# Patient Record
Sex: Male | Born: 1955 | Race: Black or African American | Hispanic: No | Marital: Married | Smoking: Former smoker
Health system: Southern US, Community
[De-identification: ages and names within clinical notes are randomized; demographics above are authoritative.]

## PROBLEM LIST (undated history)

## (undated) DIAGNOSIS — D72829 Elevated white blood cell count, unspecified: Secondary | ICD-10-CM

## (undated) DIAGNOSIS — I519 Heart disease, unspecified: Secondary | ICD-10-CM

## (undated) DIAGNOSIS — L4050 Arthropathic psoriasis, unspecified: Secondary | ICD-10-CM

## (undated) DIAGNOSIS — R933 Abnormal findings on diagnostic imaging of other parts of digestive tract: Secondary | ICD-10-CM

## (undated) DIAGNOSIS — I10 Essential (primary) hypertension: Secondary | ICD-10-CM

## (undated) DIAGNOSIS — I483 Typical atrial flutter: Secondary | ICD-10-CM

## (undated) DIAGNOSIS — M069 Rheumatoid arthritis, unspecified: Secondary | ICD-10-CM

## (undated) DIAGNOSIS — I2510 Atherosclerotic heart disease of native coronary artery without angina pectoris: Secondary | ICD-10-CM

## (undated) DIAGNOSIS — L409 Psoriasis, unspecified: Secondary | ICD-10-CM

## (undated) DIAGNOSIS — E785 Hyperlipidemia, unspecified: Secondary | ICD-10-CM

## (undated) DIAGNOSIS — I219 Acute myocardial infarction, unspecified: Secondary | ICD-10-CM

## (undated) DIAGNOSIS — I255 Ischemic cardiomyopathy: Secondary | ICD-10-CM

## (undated) DIAGNOSIS — R591 Generalized enlarged lymph nodes: Secondary | ICD-10-CM

## (undated) DIAGNOSIS — I447 Left bundle-branch block, unspecified: Secondary | ICD-10-CM

## (undated) DIAGNOSIS — J8410 Pulmonary fibrosis, unspecified: Secondary | ICD-10-CM

## (undated) DIAGNOSIS — K219 Gastro-esophageal reflux disease without esophagitis: Secondary | ICD-10-CM

## (undated) HISTORY — DX: Psoriasis, unspecified: L40.9

## (undated) HISTORY — DX: Rheumatoid arthritis, unspecified: M06.9

## (undated) HISTORY — DX: Pulmonary fibrosis, unspecified: J84.10

## (undated) HISTORY — DX: Gastro-esophageal reflux disease without esophagitis: K21.9

## (undated) HISTORY — DX: Elevated white blood cell count, unspecified: D72.829

## (undated) HISTORY — DX: Abnormal findings on diagnostic imaging of other parts of digestive tract: R93.3

## (undated) HISTORY — DX: Arthropathic psoriasis, unspecified: L40.50

## (undated) HISTORY — DX: Hyperlipidemia, unspecified: E78.5

## (undated) HISTORY — DX: Atherosclerotic heart disease of native coronary artery without angina pectoris: I25.10

## (undated) HISTORY — DX: Generalized enlarged lymph nodes: R59.1

---

## 2001-03-04 DIAGNOSIS — L409 Psoriasis, unspecified: Secondary | ICD-10-CM

## 2001-03-04 HISTORY — DX: Psoriasis, unspecified: L40.9

## 2003-06-10 ENCOUNTER — Ambulatory Visit (HOSPITAL_COMMUNITY): Admission: RE | Admit: 2003-06-10 | Discharge: 2003-06-10 | Payer: Self-pay | Admitting: Internal Medicine

## 2003-06-14 ENCOUNTER — Ambulatory Visit (HOSPITAL_COMMUNITY): Admission: RE | Admit: 2003-06-14 | Discharge: 2003-06-14 | Payer: Self-pay | Admitting: Internal Medicine

## 2003-07-01 HISTORY — PX: COLONOSCOPY: SHX174

## 2004-01-05 ENCOUNTER — Ambulatory Visit: Payer: Self-pay | Admitting: Family Medicine

## 2004-02-02 ENCOUNTER — Ambulatory Visit: Payer: Self-pay | Admitting: *Deleted

## 2005-10-15 ENCOUNTER — Ambulatory Visit: Payer: Self-pay | Admitting: Internal Medicine

## 2009-03-07 ENCOUNTER — Encounter: Payer: Self-pay | Admitting: Family Medicine

## 2010-03-04 DIAGNOSIS — M069 Rheumatoid arthritis, unspecified: Secondary | ICD-10-CM

## 2010-03-04 HISTORY — DX: Rheumatoid arthritis, unspecified: M06.9

## 2010-04-03 NOTE — Letter (Signed)
Summary: Historic Patient File  Historic Patient File   Imported By: Lind Guest 03/07/2009 09:13:40  _____________________________________________________________________  External Attachment:    Type:   Image     Comment:   External Document

## 2010-07-20 NOTE — Op Note (Signed)
NAME:  Curtis Flores, Curtis Flores                          ACCOUNT NO.:  [id number]   MEDICAL RECORD NO.:  [id number]                   PATIENT TYPE:  AMB   LOCATION:  DAY                                  FACILITY:  [facility]   PHYSICIAN:  R. Roetta Sessions, M.D.              DATE OF BIRTH:  1955/03/06   DATE OF PROCEDURE:  06/11/2003  DATE OF DISCHARGE:                                 OPERATIVE REPORT   PROCEDURE:  Screening colonoscopy.   ENDOSCOPIST:  Gerrit Friends. Rourk, M.D.   INDICATIONS FOR PROCEDURE:  The patient is a 55 year old gentleman who comes  for colorectal cancer screening. He is devoid of any lower GI tract  symptoms.  No family history of colorectal neoplasia.  He has never had his  colon imaged previously.  Colonoscopy is now being done as a standard  screening maneuver.  This approach has been discussed with the patient at  length.  The potential risks, benefits, and alternatives have been reviewed;  questions answered.   Please see my handwritten H&P for more information.   PROCEDURE NOTE:  O2 saturation, blood pressure, pulse and respirations were  monitored throughout the entirety procedure.  Conscious sedation: Versed 3 mg IV, Demerol 75 mg IV in divided doses.   INSTRUMENT:  Olympus video chip adult colonoscope.   FINDINGS:  A digital rectal exam revealed no abnormalities.   ENDOSCOPIC FINDINGS:  The prep was excellent except in the proximal right  colon and cecum where it was poor and inadequate.   RECTUM:  Examination of the rectal mucosa including the retroflex view of  the anal verge revealed no abnormalities.   COLON:  The colonic mucosa was surveyed from the rectosigmoid junction  through the left transverse and right colon.  The colonic mucosa was well  seen and appeared normal to the __________ colon.  However, in the right  colon there was a grossly inadequate prep with quite a bit of viscous  vegetable matter sitting down in the cecum and spanning the  ileocecal valve  (the patient admitted to eating a chicken sandwich yesterday afternoon  because he was hungry.  I was able to turn the patient and get this  material out of the cecum.  I saw the appendiceal orifice and the cecum  well; however, the area of the ileocecal valve and the 10 cm or so segment  of the right colon distal to the ileocecal valve was not seen. I was not  able to overcome this material with suctioning and washing or my getting it  to move out of the way by turning the patient.  Again, the base of the cecum  was well seen and the colonic mucosa all the way to the proximal right colon  was seen, however, there was a notable portion that could not be seen given  the poor prep today.  The scope was slowly withdrawn.   All  previously mentioned mucosal surfaces were again seen; no other  abnormalities were observed.  The patient tolerated the procedure well and  was reacted in endoscopy.   IMPRESSION:  1. Normal rectum.  2. Normal colonic mucosa except for the proximal right colon in the area of     the ileocecal valve as described above because of a poor prep.   I consider this exam inadequate because I was unable to see the entire  colonic mucosa.   RECOMMENDATIONS:  Will send the patient for an air contrast barium enema to  image his right colon next week.  We will make further recommendations after  the study is available for review.      ___________________________________________                                            Jonathon Bellows, M.D.   RMR/MEDQ  D:  06/10/2003  T:  06/11/2003  Job:  [id number]   cc:   Colon Flattery, D.O.  [address]  [place]  [place] [address]  Fax: [phone]   R. Roetta Sessions, M.D.  [address]  [place]  [place] [address]  Fax: [phone]

## 2011-01-22 ENCOUNTER — Institutional Professional Consult (permissible substitution): Payer: Self-pay | Admitting: Internal Medicine

## 2011-02-21 ENCOUNTER — Institutional Professional Consult (permissible substitution): Payer: Self-pay | Admitting: Internal Medicine

## 2011-03-19 ENCOUNTER — Institutional Professional Consult (permissible substitution): Payer: Self-pay | Admitting: Pulmonary Disease

## 2011-03-21 ENCOUNTER — Ambulatory Visit (INDEPENDENT_AMBULATORY_CARE_PROVIDER_SITE_OTHER): Payer: BC Managed Care – PPO | Admitting: Pulmonary Disease

## 2011-03-21 ENCOUNTER — Encounter: Payer: Self-pay | Admitting: Pulmonary Disease

## 2011-03-21 VITALS — BP 140/60 | HR 63 | Temp 98.1°F | Ht 66.0 in | Wt 181.6 lb

## 2011-03-21 DIAGNOSIS — J8410 Pulmonary fibrosis, unspecified: Secondary | ICD-10-CM

## 2011-03-21 NOTE — Assessment & Plan Note (Addendum)
With lymphadenopathy raises the question of sarcoidosis. 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 Will obtain HRCT to clarify - contrast for lymph nodes - renal function ok. Full pFTs will be obtained - but agree with holding methotrexate & using alternative agent in the meantime. He does desatn on exertion so expect some degree of restriction Would also like  Baseline ACE level - he will get this when he goes to Dr deveshwar's office - If there is a question of sarcoid, we can proceedw ith transbronchial bniopsy - however if HRCT is typical for UIP then this would be more likely due to rheumatoid. I discussed above plan with his wife & son in detail

## 2011-03-21 NOTE — Progress Notes (Signed)
  Subjective:    Patient ID: Curtis Flores, male    DOB: 1955/12/25, 56 y.o.   MRN: [id number]  HPI Rheum - Curtis Flores  55/M, ex smoker referred for evaluation of sarcoidosis. He has psoriasis x 10 yrs, rheumatoid arthritis was diagnosed 10/12  when he presented with a symmetric polyarthritis of elbows, wrists, hands, neck, shoulders & feet CXR at [facility] showed bibasal interstitial fibrosis with hilar lymphadenopathy Labs- WC 15-16k, cr 0.7ESR  34, RA 89, TSH nml, CCP 122, ANA neg, HLA B 27 neg, CK 87 PPD neg, hep panel neg Prednisone taper helped his symptoms, methotrexate was started but not continued due to his fibrosis. He denies dyspnea, cough or wheeze, there are no h/o skin lesions on his shin s/o erythema nodosum He desats to 92 % on 3 laps around the office.  Review of Systems  Constitutional: Negative for fever and unexpected weight change.  HENT: Positive for sore throat and sneezing. Negative for ear pain, nosebleeds, congestion, rhinorrhea, trouble swallowing, dental problem, postnasal drip and sinus pressure.   Eyes: Negative for redness and itching.  Respiratory: Negative for cough, chest tightness, shortness of breath and wheezing.   Cardiovascular: Positive for leg swelling. Negative for palpitations.  Gastrointestinal: Negative for nausea and vomiting.  Genitourinary: Negative for dysuria.  Musculoskeletal: Positive for joint swelling.  Skin: Positive for rash.  Neurological: Negative for headaches.  Hematological: Does not bruise/bleed easily.  Psychiatric/Behavioral: Positive for dysphoric mood. The patient is not nervous/anxious.        Objective:   Physical Exam  Gen. Pleasant, well-nourished, in no distress, normal affect ENT - no lesions, no post nasal drip Neck: No JVD, no thyromegaly, no carotid bruits Lungs: no use of accessory muscles, no dullness to percussion, clear without rales or rhonchi  Cardiovascular: Rhythm regular, heart sounds   normal, no murmurs or gallops, no peripheral edema Abdomen: soft and non-tender, no hepatosplenomegaly, BS normal. Musculoskeletal: swelling of MCP &IP jts, no cyanosis or clubbing Neuro:  alert, non focal Skin - psoriatic patches over face, scalp, pitting in nails       Assessment & Plan:

## 2011-03-21 NOTE — Patient Instructions (Signed)
You have scar tissue in the lungs which may be related to rheumatoid arthritis or another condition called sarcoidosis CT scan of chest Full breathing test Pl ask dr Corliss Skains to perform serum ACE level with blood work

## 2011-04-03 ENCOUNTER — Telehealth: Payer: Self-pay | Admitting: Oncology

## 2011-04-03 NOTE — Telephone Encounter (Signed)
called pt and scheduled NEw Pt appt for 02/06 .  will fax over letter to Dr. Drusilla Kanner with appt d/t

## 2011-04-05 ENCOUNTER — Telehealth: Payer: Self-pay | Admitting: Oncology

## 2011-04-05 ENCOUNTER — Encounter: Payer: Self-pay | Admitting: Oncology

## 2011-04-05 NOTE — Telephone Encounter (Signed)
Referred by Dr. [name] Dx-Leukocytosis

## 2011-04-09 ENCOUNTER — Ambulatory Visit (INDEPENDENT_AMBULATORY_CARE_PROVIDER_SITE_OTHER)
Admission: RE | Admit: 2011-04-09 | Discharge: 2011-04-09 | Disposition: A | Discharge status: Home / Self Care | Payer: BC Managed Care – PPO | Source: Ambulatory Visit | Attending: Pulmonary Disease | Admitting: Pulmonary Disease

## 2011-04-09 ENCOUNTER — Ambulatory Visit (INDEPENDENT_AMBULATORY_CARE_PROVIDER_SITE_OTHER): Payer: BC Managed Care – PPO | Admitting: Pulmonary Disease

## 2011-04-09 DIAGNOSIS — J8410 Pulmonary fibrosis, unspecified: Secondary | ICD-10-CM

## 2011-04-09 MED ORDER — IOHEXOL 300 MG/ML  SOLN
80.0000 mL | Freq: Once | INTRAMUSCULAR | Status: AC | PRN
  Administered 2011-04-09: 80 mL via INTRAVENOUS

## 2011-04-09 NOTE — Progress Notes (Signed)
PFT done today. 

## 2011-04-10 ENCOUNTER — Ambulatory Visit (HOSPITAL_BASED_OUTPATIENT_CLINIC_OR_DEPARTMENT_OTHER): Payer: BC Managed Care – PPO | Admitting: Oncology

## 2011-04-10 ENCOUNTER — Telehealth: Payer: Self-pay | Admitting: Oncology

## 2011-04-10 ENCOUNTER — Other Ambulatory Visit: Payer: Self-pay | Admitting: Oncology

## 2011-04-10 ENCOUNTER — Emergency Department (HOSPITAL_COMMUNITY)
Admission: EM | Admit: 2011-04-10 | Discharge: 2011-04-10 | Disposition: A | Discharge status: Home / Self Care | Payer: BC Managed Care – PPO | Attending: Emergency Medicine | Admitting: Emergency Medicine

## 2011-04-10 ENCOUNTER — Other Ambulatory Visit (HOSPITAL_COMMUNITY)
Admission: RE | Admit: 2011-04-10 | Discharge: 2011-04-10 | Disposition: A | Discharge status: Home / Self Care | Payer: BC Managed Care – PPO | Source: Ambulatory Visit | Attending: Oncology | Admitting: Oncology

## 2011-04-10 ENCOUNTER — Encounter (HOSPITAL_COMMUNITY): Payer: Self-pay | Admitting: *Deleted

## 2011-04-10 ENCOUNTER — Encounter: Payer: BC Managed Care – PPO | Admitting: *Deleted

## 2011-04-10 ENCOUNTER — Ambulatory Visit: Payer: BC Managed Care – PPO

## 2011-04-10 ENCOUNTER — Encounter: Payer: Self-pay | Admitting: Oncology

## 2011-04-10 ENCOUNTER — Other Ambulatory Visit (HOSPITAL_BASED_OUTPATIENT_CLINIC_OR_DEPARTMENT_OTHER): Payer: BC Managed Care – PPO | Admitting: Lab

## 2011-04-10 DIAGNOSIS — D473 Essential (hemorrhagic) thrombocythemia: Secondary | ICD-10-CM

## 2011-04-10 DIAGNOSIS — D75839 Thrombocytosis, unspecified: Secondary | ICD-10-CM

## 2011-04-10 DIAGNOSIS — I2510 Atherosclerotic heart disease of native coronary artery without angina pectoris: Secondary | ICD-10-CM | POA: Insufficient documentation

## 2011-04-10 DIAGNOSIS — I10 Essential (primary) hypertension: Secondary | ICD-10-CM

## 2011-04-10 DIAGNOSIS — D72829 Elevated white blood cell count, unspecified: Secondary | ICD-10-CM

## 2011-04-10 DIAGNOSIS — R599 Enlarged lymph nodes, unspecified: Secondary | ICD-10-CM

## 2011-04-10 DIAGNOSIS — R591 Generalized enlarged lymph nodes: Secondary | ICD-10-CM

## 2011-04-10 DIAGNOSIS — M069 Rheumatoid arthritis, unspecified: Secondary | ICD-10-CM

## 2011-04-10 DIAGNOSIS — J8410 Pulmonary fibrosis, unspecified: Secondary | ICD-10-CM

## 2011-04-10 DIAGNOSIS — K449 Diaphragmatic hernia without obstruction or gangrene: Secondary | ICD-10-CM | POA: Insufficient documentation

## 2011-04-10 DIAGNOSIS — R05 Cough: Secondary | ICD-10-CM | POA: Insufficient documentation

## 2011-04-10 DIAGNOSIS — R059 Cough, unspecified: Secondary | ICD-10-CM | POA: Insufficient documentation

## 2011-04-10 DIAGNOSIS — L409 Psoriasis, unspecified: Secondary | ICD-10-CM | POA: Insufficient documentation

## 2011-04-10 DIAGNOSIS — D509 Iron deficiency anemia, unspecified: Secondary | ICD-10-CM | POA: Insufficient documentation

## 2011-04-10 DIAGNOSIS — R079 Chest pain, unspecified: Secondary | ICD-10-CM | POA: Insufficient documentation

## 2011-04-10 HISTORY — DX: Essential (primary) hypertension: I10

## 2011-04-10 LAB — COMPREHENSIVE METABOLIC PANEL
ALT: 12 U/L (ref 0–53)
AST: 15 U/L (ref 0–37)
CO2: 25 mEq/L (ref 19–32)
Sodium: 137 mEq/L (ref 135–145)
Total Bilirubin: 0.3 mg/dL (ref 0.3–1.2)
Total Protein: 7.5 g/dL (ref 6.0–8.3)

## 2011-04-10 LAB — CBC WITH DIFFERENTIAL/PLATELET
BASO%: 0.5 % (ref 0.0–2.0)
Basophils Absolute: 0.1 10*3/uL (ref 0.0–0.1)
HCT: 38.9 % (ref 38.4–49.9)
HGB: 12.6 g/dL — ABNORMAL LOW (ref 13.0–17.1)
MCH: 25 pg — ABNORMAL LOW (ref 27.2–33.4)
MCV: 77.6 fL — ABNORMAL LOW (ref 79.3–98.0)
MONO%: 8.2 % (ref 0.0–14.0)
NEUT#: 11.3 10*3/uL — ABNORMAL HIGH (ref 1.5–6.5)
NEUT%: 66.3 % (ref 39.0–75.0)
RDW: 14.6 % (ref 11.0–14.6)

## 2011-04-10 LAB — LACTATE DEHYDROGENASE: LDH: 140 U/L (ref 94–250)

## 2011-04-10 LAB — MORPHOLOGY: PLT EST: ADEQUATE

## 2011-04-10 NOTE — ED Notes (Signed)
Per outpt oncology nurse pt's bp was taken several times and was elevated. Pt denies any chest pain, headaches, or blurred vision. Pt states he was anxious about all the procedures going on today. Pt is alert and oriented at this time

## 2011-04-10 NOTE — Telephone Encounter (Signed)
gv pt appt for 02/13

## 2011-04-10 NOTE — Progress Notes (Signed)
[facility] INITIAL HEMATOLOGY CONSULTATION  Referral MD:  Dr. [name], M.D.   Reason for Referral: neutrophil-predominant leukocytosis.     HPI: Curtis Flores is a 56 yo African American man no long history of psoriasis.  He was in usual state of health until late last year when he developed progressive swelling of distal interphalangeal joint in both hands which eventually become very painful.  He has had problem with buttoning his shirt.  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  He eventually developed diffuse joint pain in shoulders, elbows, wrists, and knees.  Pain is not improved with activities.  He was evaluated by Dr. Corliss Skains with positive cyclic citrullinated peptide Ab IgG at 122 U/mL (ref <5).  He was given diagnosis of rheumatoid arthritis.  He was on MTX for a while; however, when he found to have pulmonary interstitial disease, MTX was discontinued.  Dr. Corliss Skains is planning to start either Prednisone or Embrel in place of MTX after hematology evaluation.    He said that his baseline WBC was normal with his PCP for many years.  On 11/09/2010, WBC 13.6; Hgb 13.1; Plt 337.  On 03/29/2011, his WBC 14.8; Hgb 12.8; Plt 429.  Given his progressive leukocytosis, he was kindly referred to the [facility] for evaluation.  Curtis Flores presented to the clinic with his wife today.  Beside the diffuse bone pain as described above, he also has facial and chest skin rash from psoriasis.  He has mild fatigue.  He has chronic back pain; however, very mild compared to diffuse joint pain.  Patient denies headache, visual changes, confusion, drenching night sweats, palpable lymph node swelling, mucositis, odynophagia, dysphagia, nausea vomiting, jaundice, chest pain, palpitation, shortness of breath, dyspnea on exertion, productive cough, gum bleeding, epistaxis, hematemesis, hemoptysis, abdominal pain, abdominal swelling, early satiety, melena,  hematochezia, hematuria, spontaneous bleeding, heat or cold intolerance, bowel bladder incontinence, focal motor weakness, paresthesia, depression, suicidal or homocidal ideation, feeling hopelessness.   Past Medical History  Diagnosis Date  . Psoriasis 2003  . Rheumatoid arthritis 2012  . Psoriatic arthritis   . Leukocytosis   . Lymphadenopathy   . Pulmonary fibrosis   . Hypertension   . GERD (gastroesophageal reflux disease)   :    History reviewed. No pertinent past surgical history.:   CURRENT MEDS: Current Outpatient Prescriptions  Medication Sig Dispense Refill  . lisinopril-hydrochlorothiazide (PRINZIDE,ZESTORETIC) 20-25 MG per tablet Take 1 tablet by mouth daily.      . naproxen sodium (ANAPROX) 220 MG tablet Take 220 mg by mouth 2 (two) times daily with a meal.      . omeprazole (PRILOSEC) 40 MG capsule Take 40 mg by mouth daily.      [name] acetaminophen (TYLENOL) 500 MG tablet Take 1,000 mg by mouth every 6 (six) hours as needed. pain          No Known Allergies:  History reviewed. No pertinent family history.:  History   Social History  . Marital Status: Married    Spouse Name: N/A    Number of Children: 2  . Years of Education: N/A   Occupational History  . unemployed     not working do to arthritis; used to be Production designer, theatre/television/film for a [employer]   Social History Main Topics  . Smoking status: Former Smoker -- 1.0 packs/day for 30 years    Types: Cigarettes    Quit date: 03/21/2003  .  Smokeless tobacco: Never Used  . Alcohol Use: Yes     12 oz a week.   . Drug Use: No  . Sexually Active: Yes    Birth Control/ Protection: None   Other Topics Concern  . Not on file   Social History Narrative  . No narrative on file    REVIEW OF SYSTEM:  The rest of the 14-point review of sytem was negative.   Exam:  General:  well-nourished in no acute distress.  Eyes:  no scleral icterus.  ENT:  There were no oropharyngeal lesions.  Neck was without  thyromegaly.  Lymphatics:  Negative cervical, supraclavicular or axillary adenopathy.  Respiratory: lungs were clear bilaterally without wheezing or crackles.  Cardiovascular:  Regular rate and rhythm, S1/S2, without murmur, rub or gallop.  There was no pedal edema.  GI:  abdomen was soft, flat, nontender, nondistended, without organomegaly.  Muscoloskeletal:  no spinal tenderness of palpation of enlarged distal interphalangeal joint swelling.  Skin exam was without echymosis, petichae.  There was patches of lighter skin discoloration and erythema in the face and chest.  Neuro exam was nonfocal.  Patient was able to get on and off exam table without assistance.  Gait was normal.  Patient was alerted and oriented.  Attention was good.   Language was appropriate.  Mood was normal without depression.  Speech was not pressured.  Thought content was not tangential.    LABS:  Lab Results  Component Value Date   WBC 17.0* 04/10/2011   HGB 12.6* 04/10/2011   HCT 38.9 04/10/2011   PLT 384 04/10/2011   GLUCOSE 94 04/10/2011   ALT 12 04/10/2011   AST 15 04/10/2011   NA 137 04/10/2011   K 4.2 04/10/2011   CL 99 04/10/2011   CREATININE 0.63 04/10/2011   BUN 14 04/10/2011   CO2 25 04/10/2011    IMAGING:  Ct Chest W Contrast:  I personally reviewed the following CT scan images.     04/09/2011  *RADIOLOGY REPORT*  Clinical Data: Productive cough and left-sided chest pain and back pain for 1 week.  Nonsmoker.  Evaluate for pulmonary fibrosis.  CT CHEST WITH CONTRAST  Technique:  Multidetector CT imaging of the chest was performed following the standard protocol during bolus administration of intravenous contrast.  Contrast: 80mL OMNIPAQUE IOHEXOL 300 MG/ML IV SOLN  Comparison: None.  Findings:  Mediastinum: Heart size is borderline enlarged.  There is no significant pericardial fluid, thickening or pericardial calcification.  There is atherosclerosis of the thoracic aorta, the great vessels of the mediastinum and the coronary arteries,  including calcified atherosclerotic plaque in the left main, left anterior descending, left circumflex and right coronary arteries. There is a moderate hiatal hernia. Numerous borderline enlarged and mildly enlarged mediastinal and bilateral hilar lymph nodes are noted, largest cluster in the right hilar region measures up to 1.4 cm in short axis.  Lungs/Pleura:  Scattered throughout to the lungs bilaterally there are extensive regions of ground-glass attenuation and most pronounced in the lower lobes of the lungs bilaterally. These areas demonstrate internal interstitial prominence with a large amount of microcystic honeycombing (no macrocystic honeycombing identified), with some cylindrical bronchiectasis and bronchiolectasis. Notably, there is typically sparing of the immediate subpleural lung in the regions of involvement.  Inspiratory and excretory images demonstrates increased density throughout areas of ground-glass attenuation on expiration.  No areas of significant air trapping are noted.  Upper Abdomen: Visualized portions of the upper abdomen are unremarkable.  Musculoskeletal: There are  no aggressive appearing lytic or blastic lesions noted in the visualized portions of the skeleton.  IMPRESSION: 1.  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 Prominent mediastinal and hilar lymph nodes are presumably reactive, and are common in this setting of interstitial lung disease. 2.  Atherosclerosis, including left main and three-vessel coronary artery disease. Please note that although the presence of coronary artery calcium documents the presence of coronary artery disease, the severity of this disease and any potential stenosis cannot be assessed on this non-gated CT examination.  Assessment for potential risk factor modification, dietary therapy or pharmacologic therapy may be warranted, if clinically  indicated. 3.  Moderate sized hiatal hernia.  Original Report Authenticated By: Florencia Reasons, M.D.    Blood smear review:   I personally reviewed the patient's peripheral blood smear today.  There was isocytosis.  There was no peripheral blast.  There was increasd in neutrophils.  There was no schistocytosis, spherocytosis, target cell, rouleaux formation, tear drop cell.  There was no giant platelets, but there were occasional platelet clumps.     ASSESSMENT AND PLAN:   1.  Psoriasis:  Many years, involving mostly the face and chest.  2.  Rheumatoid arthritis since 2012:  He was on MTX which was discontinued when lung pathology was found.  He is awaiting to start on Prednisone or Embrel once his leukocytosis is addressed.  3.  Interstitial lung disease with hilar/mediastinal adenopathy:  Most likely due to RA.  He has seen Dr. Vassie Loll.  His ACE level was normal making it less likely to be sarcoid.  I defer to Dr. Vassie Loll to decide whether lung biopsy is indicated; however, story is consistent with RA.  If Dr. Vassie Loll decides not to pursue lung biopsy, then I may consider repeating lung CT in about 4-6 months after he has been on RA treatment to document either resolution or at least improvement of these lung abnormalities.    4.  Leukocytosis and mild thrombocytosis:   Differential diagnosis:  Most likely reactive to his RA.  However, it has been quite progressive since 11/2010 and is now WBC of 17K.  I need to rule out lymphoproliferative disease due to presence of adenopathy.  I need to rule out myeloproliferative disease given both leukocytosis and thrombocytosis.  Work up:  Given the urgency to start him on treatment for his debilitating RA, I recommended diagnosis bone marrow biopsy to rule out these differentials before assuming reactive  eukocytosis and thrombocytosis.  I discussed with him and his wife the benefit and risks of bone marrow biopsy.  He expressed informed understanding and  wished to proceed with that today.  I also sent bone marrow aspiration for BCR/ABL to rule out CML.  I also sent for peripheral blood JAK-2 mutation to rule out essential thrombocytosis given his thrombocytosis.    Treatment:  Depending on result of pending bone marrow biopsy.   5.  Hypertension:  Pre bone marrow, his BP was 140/60.  However, after this extensive discussion and bone marrow biopsy which he only received 20 mL of 2% lidocaine, his BP increased to 255/101, albeit asymptomatic.  Given severe hypertension, he was recommended to present to ER for evaluation.  He has been on home Lisinopril and HCTZ.   6.  Follow up:  RV with me on 04/17/11 to go over result of bone marrow biopsy today.  The length of time of the face-to-face  encounter was 45 minutes. More than 50% of time was spent counseling and coordination of care.

## 2011-04-10 NOTE — Progress Notes (Signed)
Bone marrow biopsy and aspiration.  INDICATION:  Progressive leukocytosis and adenopathy.   Procedure: After obtained from consent, Curtis Flores was placed in the prone position. Time out was performed verifying correct patient and procedure. The skin overlying the left posterior crest was prepped with Betadine and draped in the usual sterile fashion. The skin and periosteum were infiltrated with 10 mL of 2% lidocaine x 2 as he was still uncomfortable after the first 10mL. A small puncture wound was made with #11 scalpel blade.  Bone marrow aspirate was obtained on the first pass of the aspiration needle.  One core biopsy was obtained through the same incision.   The aspirate was sent for routine histology, flow cytometry, and cytogenetics.  Core biopsy was sent for routine histology.   Curtis Flores tolerated procedure well with minimal  blood loss and without immediate complication.   A sterile dressing was applied.   Jethro Bolus M.D. 04/10/2011

## 2011-04-10 NOTE — ED Provider Notes (Signed)
History     CSN: [id number]  Arrival date & time 04/10/11  1346   First MD Initiated Contact with Patient 04/10/11 1502      Chief Complaint  Patient presents with  . Hypertension    (Consider location/radiation/quality/duration/timing/severity/associated sxs/prior treatment) Patient is a 56 y.o. male presenting with hypertension. The history is provided by the patient.  Hypertension This is a new (Today after getting a bone marrow biopsy patient had hypertension the cancer Center and he was sent down here for further evaluation) problem. The current episode started less than 1 hour ago. The problem occurs constantly. The problem has been resolved. Pertinent negatives include no chest pain, no abdominal pain, no headaches and no shortness of breath. Associated symptoms comments: Weakness, confusion. The symptoms are aggravated by nothing. The symptoms are relieved by rest. He has tried nothing for the symptoms. The treatment provided no relief.    Past Medical History  Diagnosis Date  . Psoriasis 2003  . Rheumatoid arthritis 2012  . Psoriatic arthritis   . Leukocytosis   . Lymphadenopathy   . Pulmonary fibrosis   . Hypertension     History reviewed. No pertinent past surgical history.  No family history on file.  History  Substance Use Topics  . Smoking status: Former Smoker -- 1.0 packs/day for 30 years    Types: Cigarettes    Quit date: 03/21/2003  . Smokeless tobacco: Never Used  . Alcohol Use: Yes     12 oz a week.       Review of Systems  Respiratory: Negative for shortness of breath.   Cardiovascular: Negative for chest pain.  Gastrointestinal: Negative for abdominal pain.  Neurological: Negative for seizures, syncope, speech difficulty, weakness, numbness and headaches.  All other systems reviewed and are negative.    Allergies  Review of patient's allergies indicates no known allergies.  Home Medications   Current Outpatient Rx  Name Route Sig  Dispense Refill  . ACETAMINOPHEN 500 MG PO TABS Oral Take 1,000 mg by mouth every 6 (six) hours as needed. pain    . LISINOPRIL-HYDROCHLOROTHIAZIDE 20-25 MG PO TABS Oral Take 1 tablet by mouth daily.    [name] NAPROXEN SODIUM 220 MG PO TABS Oral Take 220 mg by mouth 2 (two) times daily with a meal.    . OMEPRAZOLE 40 MG PO CPDR Oral Take 40 mg by mouth daily.      BP 147/63  Pulse 70  Temp(Src) 98.7 F (37.1 C) (Oral)  Resp 18  Ht 5\' 7"  (1.702 m)  Wt 181 lb (82.101 kg)  BMI 28.35 kg/m2  SpO2 99%  Physical Exam  Nursing note and vitals reviewed. Constitutional: He is oriented to person, place, and time. He appears well-developed and well-nourished. No distress.  HENT:  Head: Normocephalic and atraumatic.  Mouth/Throat: Oropharynx is clear and moist.  Eyes: Conjunctivae and EOM are normal. Pupils are equal, round, and reactive to light.  Cardiovascular: Normal rate, regular rhythm and intact distal pulses.   No murmur heard. Pulmonary/Chest: Effort normal and breath sounds normal. No respiratory distress. He has no wheezes. He has no rales.  Abdominal: Soft. He exhibits no distension. There is no tenderness. There is no rebound and no guarding.  Musculoskeletal: Normal range of motion. He exhibits no edema and no tenderness.  Neurological: He is alert and oriented to person, place, and time.  Skin: Skin is warm and dry. No rash noted. No erythema.  Psychiatric: He has a normal mood and affect.  His behavior is normal.    ED Course  Procedures (including critical care time)  Labs Reviewed - No data to display Ct Chest W Contrast  04/09/2011  *RADIOLOGY REPORT*  Clinical Data: Productive cough and left-sided chest pain and back pain for 1 week.  Nonsmoker.  Evaluate for pulmonary fibrosis.  CT CHEST WITH CONTRAST  Technique:  Multidetector CT imaging of the chest was performed following the standard protocol during bolus administration of intravenous contrast.  Contrast: 80mL OMNIPAQUE  IOHEXOL 300 MG/ML IV SOLN  Comparison: None.  Findings:  Mediastinum: Heart size is borderline enlarged.  There is no significant pericardial fluid, thickening or pericardial calcification.  There is atherosclerosis of the thoracic aorta, the great vessels of the mediastinum and the coronary arteries, including calcified atherosclerotic plaque in the left main, left anterior descending, left circumflex and right coronary arteries. There is a moderate hiatal hernia. Numerous borderline enlarged and mildly enlarged mediastinal and bilateral hilar lymph nodes are noted, largest cluster in the right hilar region measures up to 1.4 cm in short axis.  Lungs/Pleura:  Scattered throughout to the lungs bilaterally there are extensive regions of ground-glass attenuation and most pronounced in the lower lobes of the lungs bilaterally. These areas demonstrate internal interstitial prominence with a large amount of microcystic honeycombing (no macrocystic honeycombing identified), with some cylindrical bronchiectasis and bronchiolectasis. Notably, there is typically sparing of the immediate subpleural lung in the regions of involvement.  Inspiratory and excretory images demonstrates increased density throughout areas of ground-glass attenuation on expiration.  No areas of significant air trapping are noted.  Upper Abdomen: Visualized portions of the upper abdomen are unremarkable.  Musculoskeletal: There are no aggressive appearing lytic or blastic lesions noted in the visualized portions of the skeleton.  IMPRESSION: 1.  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 Prominent mediastinal and hilar lymph nodes are presumably reactive, and are common in this setting of interstitial lung disease. 2.  Atherosclerosis, including left main and three-vessel coronary artery disease. Please note that although the presence of coronary  artery calcium documents the presence of coronary artery disease, the severity of this disease and any potential stenosis cannot be assessed on this non-gated CT examination.  Assessment for potential risk factor modification, dietary therapy or pharmacologic therapy may be warranted, if clinically indicated. 3.  Moderate sized hiatal hernia.  Original Report Authenticated By: Florencia Reasons, M.D.     No diagnosis found.    MDM   Patient sent from the cancer center due to having high blood pressure. He was getting a bone marrow biopsy today and states he was very anxious about the procedure. On arrival to the cancer Center today his blood pressure was 138/67 and after the procedure it was 219/109. He was sent down here. After resting here and that his blood pressure is now 146/67. He denies any headache, shortness of breath, chest pain, weakness or other symptoms suggestive of end organ damage. He has taken all of his blood pressure medications today and has a normal exam otherwise. Will discharge home        Gwyneth Sprout, MD 04/10/11 [phone]

## 2011-04-10 NOTE — Progress Notes (Signed)
Bone Marrow Bx performed by Dr. Gaylyn Rong.  Pt tolerated well.  Instructed to lay on back x 30 min and can be d/c'd after 1315pm if VSS and no bleeding.  Instructed pt to leave dressing on until tomorrow.  Instructed to lay on back x 30 min if any bleeding thru dressing and call if bleeding does not stop.  Dr. Gaylyn Rong instructed pt/wife on pt taking tylenol for pain.  Instructed pt to call [facility] for any questions or concerns.  He verbalized understanding.     This encounter was created in error - please disregard.

## 2011-04-10 NOTE — Patient Instructions (Signed)
[facility] Discharge Instructions for Post Bone Marrow Procedure  Today you had a bone marrow biopsy and aspirate of Left Hip.   Please keep the pressure dressing in place for at least 24 hours.  Have someone check your dressing periodically for bleeding.  If any bleeding, lay on back for 30 minutes.  Call Cancer Center if does not stop (before 5pm) or go to ED if after 5pm.    Take pain medication, tylenol, as directed.  IF BLEEDING REOCCURS THAT SHOULD BE REPORTED IMMEDIATELY. Call the Cancer Center at [phone] if during business hours. Or report to the Emergency Room.   I have been informed and understand all the instructions given to me. I know to contact the clinic, my physician, or go to the Emergency Department if any problems should occur. I do not have any questions at this time, but understand that I may call the clinic during office hours at [phone]  should I have any questions or need assistance in obtaining follow up care.    __________________________________________  _____________  __________ Signature of Patient or Authorized Representative            Date                   Time    __________________________________________ Nurse's Signature

## 2011-04-10 NOTE — Progress Notes (Signed)
Pt taken to ED by w/c w/ wife due to Hypertension. Pt agreed and Dr. Gaylyn Rong notified.

## 2011-04-10 NOTE — ED Notes (Signed)
Pt reports hx of RA, at oncology center today for bone marrow biopsy and BP was too high; pt reports that he thinks he just got excited about what was happening and BP has increased; pt denies any c/o chest pain, blurred vision, dizziness, or headache at this time; family at bedside

## 2011-04-15 LAB — JAK-2 V617F

## 2011-04-17 ENCOUNTER — Ambulatory Visit (HOSPITAL_BASED_OUTPATIENT_CLINIC_OR_DEPARTMENT_OTHER): Payer: BC Managed Care – PPO | Admitting: Oncology

## 2011-04-17 ENCOUNTER — Telehealth: Payer: Self-pay | Admitting: Oncology

## 2011-04-17 DIAGNOSIS — D72829 Elevated white blood cell count, unspecified: Secondary | ICD-10-CM

## 2011-04-17 DIAGNOSIS — M069 Rheumatoid arthritis, unspecified: Secondary | ICD-10-CM

## 2011-04-17 DIAGNOSIS — L408 Other psoriasis: Secondary | ICD-10-CM

## 2011-04-17 DIAGNOSIS — J8410 Pulmonary fibrosis, unspecified: Secondary | ICD-10-CM

## 2011-04-17 NOTE — Progress Notes (Signed)
[facility] OFFICE PROGRESS NOTE   DIAGNOSIS:  Reactive leukocytosis.   CURRENT THERAPY:  Watchful observation.  Treating underlying rheumatoid arthritis.   INTERVAL HISTORY: Curtis Flores 56 y.o. male returns to clinic with his wife to go over the result of bone marrow biopsy last week.  He still has progressive swelling and pain in the finger joints.  He reports that he has been having problem with using utensils, writing, buttoning his shirt.  He has some fatigue; however, he is still independent of activities of daily living. His psoriatic skin rash on his face is stable.   Otherwise, he denies severe fatigue, headache, visual changes, confusion, drenching night sweats, palpable lymph node swelling, mucositis, odynophagia, dysphagia, nausea vomiting, jaundice, chest pain, palpitation, shortness of breath, dyspnea on exertion, productive cough, gum bleeding, epistaxis, hematemesis, hemoptysis, abdominal pain, abdominal swelling, early satiety, melena, hematochezia, hematuria, spontaneous bleeding, heat or cold intolerance, bowel bladder incontinence, back pain, focal motor weakness, paresthesia, depression, suicidal or homocidal ideation, feeling hopelessness.   Past Medical History  Diagnosis Date  . Psoriasis 2003  . Rheumatoid arthritis 2012  . Psoriatic arthritis   . Leukocytosis   . Lymphadenopathy   . Pulmonary fibrosis   . Hypertension   . GERD (gastroesophageal reflux disease)     No past surgical history on file.  Current Outpatient Prescriptions  Medication Sig Dispense Refill  . acetaminophen (TYLENOL) 500 MG tablet Take 1,000 mg by mouth every 6 (six) hours as needed. pain      . lisinopril-hydrochlorothiazide (PRINZIDE,ZESTORETIC) 20-25 MG per tablet Take 1 tablet by mouth daily.      . naproxen sodium (ANAPROX) 220 MG tablet Take 220 mg by mouth 2 (two) times daily with a meal.      . omeprazole (PRILOSEC) 40 MG capsule Take 40 mg by mouth daily.          ALLERGIES:   has no known allergies.  REVIEW OF SYSTEMS:  The rest of the 14-point review of system was negative.   Filed Vitals:   04/17/11 1307  BP: 151/71  Pulse: 63  Temp: 97.3 F (36.3 C)   Wt Readings from Last 3 Encounters:  04/17/11 180 lb 4.8 oz (81.784 kg)  04/10/11 181 lb (82.101 kg)  04/10/11 181 lb 11.2 oz (82.419 kg)   ECOG Performance status: 1  PHYSICAL EXAMINATION:   General: well-nourished in no acute distress. Eyes: no scleral icterus. ENT: There were no oropharyngeal lesions. Neck was without thyromegaly. Lymphatics: Negative cervical, supraclavicular or axillary adenopathy. Respiratory: lungs were clear bilaterally without wheezing or crackles. Cardiovascular: Regular rate and rhythm, S1/S2, without murmur, rub or gallop. There was no pedal edema. GI: abdomen was soft, flat, nontender, nondistended, without organomegaly. Muscoloskeletal: no spinal tenderness of palpation of enlarged distal interphalangeal joint swelling. Skin exam was without echymosis, petichae. There was patches of lighter skin discoloration and erythema in the face and chest. Neuro exam was nonfocal. Patient was able to get on and off exam table without assistance. Gait was normal. Patient was alerted and oriented. Attention was good. Language was appropriate. Mood was normal without depression. Speech was not pressured. Thought content was not tangential   LABORATORY/RADIOLOGY DATA:  Lab Results  Component Value Date   WBC 17.0* 04/10/2011   HGB 12.6* 04/10/2011   HCT 38.9 04/10/2011   PLT 384 04/10/2011   GLUCOSE 94 04/10/2011   ALT 12 04/10/2011   AST 15 04/10/2011   NA 137 04/10/2011   K  4.2 04/10/2011   CL 99 04/10/2011   CREATININE 0.63 04/10/2011   BUN 14 04/10/2011   CO2 25 04/10/2011   PATH:  Bone marrow biopsy on 04/10/2011 showed normal cellular; normal trilineage hematopoesis.  There was a small lymphoid aggregate with no monoclonal process per flow cytometry   Ct Chest W Contrast  04/09/2011   *RADIOLOGY REPORT*  Clinical Data: Productive cough and left-sided chest pain and back pain for 1 week.  Nonsmoker.  Evaluate for pulmonary fibrosis.  CT CHEST WITH CONTRAST  Technique:  Multidetector CT imaging of the chest was performed following the standard protocol during bolus administration of intravenous contrast.  Contrast: 80mL OMNIPAQUE IOHEXOL 300 MG/ML IV SOLN  Comparison: None.  Findings:  Mediastinum: Heart size is borderline enlarged.  There is no significant pericardial fluid, thickening or pericardial calcification.  There is atherosclerosis of the thoracic aorta, the great vessels of the mediastinum and the coronary arteries, including calcified atherosclerotic plaque in the left main, left anterior descending, left circumflex and right coronary arteries. There is a moderate hiatal hernia. Numerous borderline enlarged and mildly enlarged mediastinal and bilateral hilar lymph nodes are noted, largest cluster in the right hilar region measures up to 1.4 cm in short axis.  Lungs/Pleura:  Scattered throughout to the lungs bilaterally there are extensive regions of ground-glass attenuation and most pronounced in the lower lobes of the lungs bilaterally. These areas demonstrate internal interstitial prominence with a large amount of microcystic honeycombing (no macrocystic honeycombing identified), with some cylindrical bronchiectasis and bronchiolectasis. Notably, there is typically sparing of the immediate subpleural lung in the regions of involvement.  Inspiratory and excretory images demonstrates increased density throughout areas of ground-glass attenuation on expiration.  No areas of significant air trapping are noted.  Upper Abdomen: Visualized portions of the upper abdomen are unremarkable.  Musculoskeletal: There are no aggressive appearing lytic or blastic lesions noted in the visualized portions of the skeleton.  IMPRESSION: 1.  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 Prominent mediastinal and hilar lymph nodes are presumably reactive, and are common in this setting of interstitial lung disease. 2.  Atherosclerosis, including left main and three-vessel coronary artery disease. Please note that although the presence of coronary artery calcium documents the presence of coronary artery disease, the severity of this disease and any potential stenosis cannot be assessed on this non-gated CT examination.  Assessment for potential risk factor modification, dietary therapy or pharmacologic therapy may be warranted, if clinically indicated. 3.  Moderate sized hiatal hernia.  Original Report Authenticated By: Florencia Reasons, M.D.    ASSESSMENT AND PLAN:   1. Psoriasis: Many years, involving mostly the face and chest.   2. Rheumatoid arthritis since 2012: He was on MTX which was discontinued when lung pathology was found. He is awaiting to start on Prednisone or Embrel once his leukocytosis is addressed.  There is no contraindication from hematologic standpoint for him to start treatment for his RA.   3. Interstitial lung disease with hilar/mediastinal adenopathy: Most likely due to RA. He has seen Dr. Vassie Loll. His ACE level was normal making it less likely to be sarcoid. I discussed with Dr. Vassie Loll last week.  He may consider lung biopsy in the near future.   4. Leukocytosis and mild thrombocytosis: reactive from RA.  Bone marrow biopsy was negative for lymphoproliferative disease or myeloproliferative disease.  Again, these should not prevent him from any RA-directed  therapy.  Hopefully, these will improve with treatment of the underlying disease process.   5. Hypertension: He has been on home Lisinopril and HCTZ.   6. Follow up: lab monthly.  I will see him in about 4 months.

## 2011-04-17 NOTE — Telephone Encounter (Signed)
Gv pt appt for march-june2013 °

## 2011-04-18 ENCOUNTER — Encounter: Payer: Self-pay | Admitting: Oncology

## 2011-04-18 ENCOUNTER — Encounter: Payer: Self-pay | Admitting: *Deleted

## 2011-04-18 ENCOUNTER — Encounter: Payer: Self-pay | Admitting: Pulmonary Disease

## 2011-04-18 NOTE — Progress Notes (Signed)
Faxed office note to Dr. [name] per Dr. Lodema Pilot request (to [phone]).

## 2011-04-18 NOTE — Progress Notes (Signed)
Patient approve [employer] 04/18/11 - 10/16/11.

## 2011-04-24 ENCOUNTER — Encounter: Payer: Self-pay | Admitting: Pulmonary Disease

## 2011-04-24 ENCOUNTER — Ambulatory Visit (INDEPENDENT_AMBULATORY_CARE_PROVIDER_SITE_OTHER): Payer: BC Managed Care – PPO | Admitting: Pulmonary Disease

## 2011-04-24 DIAGNOSIS — M069 Rheumatoid arthritis, unspecified: Secondary | ICD-10-CM

## 2011-04-24 DIAGNOSIS — J8410 Pulmonary fibrosis, unspecified: Secondary | ICD-10-CM

## 2011-04-24 DIAGNOSIS — D72829 Elevated white blood cell count, unspecified: Secondary | ICD-10-CM

## 2011-04-24 NOTE — Progress Notes (Signed)
Received progress notes from Dr. Corliss Skains [facility]; forwarded to Dr. Gaylyn Rong.

## 2011-04-24 NOTE — Patient Instructions (Signed)
You have inflammation and scar tissue in your lungs This may be related to rheumatoid arthritis or other conditions such as sarcoidosis A lung biopsy can sometimes tell the difference We discussed risks & benefits of the procedure - I can do his on march 11, 12 th, 13th or 14 th in the morning - Pl call me back to confirm

## 2011-04-24 NOTE — Progress Notes (Signed)
  Subjective:    Patient ID: Curtis Flores, male    DOB: 1955/12/23, 56 y.o.   MRN: [id number]  HPI Rheum - Curtis Flores   56/M, ex smoker referred for evaluation of sarcoidosis.  He has psoriasis x 10 yrs, rheumatoid arthritis was diagnosed 10/12 when he presented with a symmetric polyarthritis of elbows, wrists, hands, neck, shoulders & feet  CXR at [facility] showed bibasal interstitial fibrosis with hilar lymphadenopathy  Labs- WC 15-16k, cr 0.7ESR 34, RA 89, TSH nml, CCP 122, ANA neg, HLA B 27 neg, CK 87  PPD neg, hep panel neg  Prednisone taper helped his symptoms, methotrexate was started but not continued due to his fibrosis.  He desats to 92 % on 3 laps around the office.   04/24/2011 Seen by Dr Gaylyn Rong for persistent leucocytosis, bone marrow biopsy neg Prednisone helped  Joints better He denies dyspnea, cough or wheeze, there are no h/o skin lesions on his shin s/o erythema nodosum . ACE level was not obtained  PFTs show intraprenchymal restricton with FVC around 71% & DLCO 56% c/w ILD, no airway obstruction. HRCT showed extensive regions of ground-glass attenuation and most pronounced in the lower lobes of the lungs bilaterally. These areas demonstrate internal interstitial prominence with a large amount of  microcystic honeycombing (no macrocystic honeycombing identified), with some cylindrical bronchiectasis and bronchiolectasis. There was sparing of the immediate subpleural  lung in the regions of involvement, reactive medistinal Lns.Coronary artery calcification & moderate hiatal hernia was also noted.   Review of Systems Patient denies significant dyspnea,cough, hemoptysis,  chest pain, palpitations, pedal edema, orthopnea, paroxysmal nocturnal dyspnea, lightheadedness, nausea, vomiting, abdominal or  leg pains      Objective:   Physical Exam  Gen. Pleasant, well-nourished, in no distress, normal affect ENT - no lesions, no post nasal drip Neck: No JVD, no thyromegaly,  no carotid bruits Lungs: no use of accessory muscles, no dullness to percussion, clear without rales or rhonchi  Cardiovascular: Rhythm regular, heart sounds  normal, no murmurs or gallops, no peripheral edema Abdomen: soft and non-tender, no hepatosplenomegaly, BS normal. Musculoskeletal: swollen IP & MP joints, no cyanosis or clubbing Neuro:  alert, non focal        Assessment & Plan:

## 2011-04-24 NOTE — Assessment & Plan Note (Signed)
Prednisone seems to be helping & Rodena Medin is planned Would like to obtain ACE level if possible with his next labs

## 2011-04-24 NOTE — Assessment & Plan Note (Signed)
Wonder if we have to keep alternative etiologies such as Sweets syndrome  - with persistent leucocytosis

## 2011-04-24 NOTE — Assessment & Plan Note (Signed)
Discussed transbronchial biopsy vs surgical biopsy to make the diagnosis of NSIP vs sarcoid. The risks of the procedure including coughing, bleeding and the small chance of lung puncture requiring chest tube were discussed in great detail. The benefits & alternatives including serial follow up were also discussed. He was agreeable but wife seemed hesitant to go through with the procedure. I gave them tentative dates & they will call back to schedule.

## 2011-05-01 ENCOUNTER — Encounter: Payer: Self-pay | Admitting: Oncology

## 2011-05-06 ENCOUNTER — Other Ambulatory Visit (HOSPITAL_BASED_OUTPATIENT_CLINIC_OR_DEPARTMENT_OTHER): Payer: BC Managed Care – PPO | Admitting: Lab

## 2011-05-06 ENCOUNTER — Telehealth: Payer: Self-pay | Admitting: *Deleted

## 2011-05-06 DIAGNOSIS — M069 Rheumatoid arthritis, unspecified: Secondary | ICD-10-CM

## 2011-05-06 DIAGNOSIS — D72829 Elevated white blood cell count, unspecified: Secondary | ICD-10-CM

## 2011-05-06 LAB — CBC WITH DIFFERENTIAL/PLATELET
Basophils Absolute: 0 10*3/uL (ref 0.0–0.1)
HGB: 13.2 g/dL (ref 13.0–17.1)
MCH: 25.5 pg — ABNORMAL LOW (ref 27.2–33.4)
MONO%: 3 % (ref 0.0–14.0)
RDW: 16.5 % — ABNORMAL HIGH (ref 11.0–14.6)
WBC: 14.3 10*3/uL — ABNORMAL HIGH (ref 4.0–10.3)
lymph#: 3.1 10*3/uL (ref 0.9–3.3)

## 2011-05-06 NOTE — Telephone Encounter (Signed)
Called pt w/ results of WBC and informed getting better.  Keep next lab appt as scheduled in one month.  Pt verbalized understanding.

## 2011-05-06 NOTE — Telephone Encounter (Signed)
Message copied by Wende Mott on Mon May 06, 2011  1:42 PM ------      Message from: HA, Raliegh Ip T      Created: Mon May 06, 2011  1:25 PM       Please call pt.  His WBC is trending toward normal.  Continue observation from Hem standpoint; continue treatment for RA per rheumatologist.   Thanks.

## 2011-06-04 ENCOUNTER — Telehealth: Payer: Self-pay

## 2011-06-04 ENCOUNTER — Other Ambulatory Visit (HOSPITAL_BASED_OUTPATIENT_CLINIC_OR_DEPARTMENT_OTHER): Payer: BC Managed Care – PPO | Admitting: Lab

## 2011-06-04 DIAGNOSIS — D72829 Elevated white blood cell count, unspecified: Secondary | ICD-10-CM

## 2011-06-04 DIAGNOSIS — M069 Rheumatoid arthritis, unspecified: Secondary | ICD-10-CM

## 2011-06-04 LAB — CBC WITH DIFFERENTIAL/PLATELET
BASO%: 0.5 % (ref 0.0–2.0)
Basophils Absolute: 0.1 10*3/uL (ref 0.0–0.1)
EOS%: 0.3 % (ref 0.0–7.0)
HCT: 42.2 % (ref 38.4–49.9)
MCH: 25.6 pg — ABNORMAL LOW (ref 27.2–33.4)
MCHC: 32 g/dL (ref 32.0–36.0)
MCV: 80.2 fL (ref 79.3–98.0)
MONO%: 6.2 % (ref 0.0–14.0)
NEUT%: 71.4 % (ref 39.0–75.0)
Platelets: 310 10*3/uL (ref 140–400)
nRBC: 0 % (ref 0–0)

## 2011-06-04 NOTE — Telephone Encounter (Signed)
Message copied by Kallie Locks on Tue Jun 04, 2011  3:47 PM ------      Message from: Jethro Bolus T      Created: Tue Jun 04, 2011  1:59 PM       Please call pt.  His WBC is still elevated, but overall stable.  Continue observation from hem standpoint.  He should continue treatment for his rheumatoid arthritis with his rheumatologist.  Thanks.

## 2011-06-05 ENCOUNTER — Telehealth: Payer: Self-pay

## 2011-06-05 NOTE — Telephone Encounter (Signed)
Message copied by Kallie Locks on Wed Jun 05, 2011  3:45 PM ------      Message from: Jethro Bolus T      Created: Tue Jun 04, 2011  1:59 PM       Please call pt.  His WBC is still elevated, but overall stable.  Continue observation from hem standpoint.  He should continue treatment for his rheumatoid arthritis with his rheumatologist.  Thanks.

## 2011-07-10 ENCOUNTER — Other Ambulatory Visit (HOSPITAL_BASED_OUTPATIENT_CLINIC_OR_DEPARTMENT_OTHER): Payer: BC Managed Care – PPO | Admitting: Lab

## 2011-07-10 DIAGNOSIS — M069 Rheumatoid arthritis, unspecified: Secondary | ICD-10-CM

## 2011-07-10 DIAGNOSIS — D72829 Elevated white blood cell count, unspecified: Secondary | ICD-10-CM

## 2011-07-10 LAB — CBC WITH DIFFERENTIAL/PLATELET
BASO%: 0.6 % (ref 0.0–2.0)
Basophils Absolute: 0.1 10*3/uL (ref 0.0–0.1)
EOS%: 1.9 % (ref 0.0–7.0)
HCT: 43.9 % (ref 38.4–49.9)
HGB: 14.7 g/dL (ref 13.0–17.1)
MCH: 26.3 pg — ABNORMAL LOW (ref 27.2–33.4)
MONO#: 1.1 10*3/uL — ABNORMAL HIGH (ref 0.1–0.9)
NEUT#: 7.5 10*3/uL — ABNORMAL HIGH (ref 1.5–6.5)
NEUT%: 52.6 % (ref 39.0–75.0)
RDW: 15.8 % — ABNORMAL HIGH (ref 11.0–14.6)
lymph#: 5.3 10*3/uL — ABNORMAL HIGH (ref 0.9–3.3)

## 2011-07-12 ENCOUNTER — Telehealth: Payer: Self-pay

## 2011-07-12 NOTE — Telephone Encounter (Signed)
Message copied by Kallie Locks on Fri Jul 12, 2011  4:47 PM ------      Message from: HA, Raliegh Ip T      Created: Wed Jul 10, 2011  2:08 PM       Please call pt.  His elevated WBC has slightly improved.  This is reactive from his rheumatoid arthritis.  Will continue observation for elevated WBC.  He should continue treatment for his rheumatoid arthritis with his rheumatologist.  Thanks.

## 2011-08-09 ENCOUNTER — Other Ambulatory Visit: Payer: Self-pay | Admitting: Oncology

## 2011-08-09 DIAGNOSIS — D72829 Elevated white blood cell count, unspecified: Secondary | ICD-10-CM

## 2011-08-12 ENCOUNTER — Other Ambulatory Visit (HOSPITAL_BASED_OUTPATIENT_CLINIC_OR_DEPARTMENT_OTHER): Payer: BC Managed Care – PPO | Admitting: Lab

## 2011-08-12 ENCOUNTER — Ambulatory Visit (HOSPITAL_BASED_OUTPATIENT_CLINIC_OR_DEPARTMENT_OTHER): Payer: BC Managed Care – PPO | Admitting: Oncology

## 2011-08-12 VITALS — BP 161/83 | HR 55 | Temp 97.0°F | Ht 67.0 in | Wt 192.6 lb

## 2011-08-12 DIAGNOSIS — J8410 Pulmonary fibrosis, unspecified: Secondary | ICD-10-CM

## 2011-08-12 DIAGNOSIS — D72829 Elevated white blood cell count, unspecified: Secondary | ICD-10-CM

## 2011-08-12 DIAGNOSIS — M069 Rheumatoid arthritis, unspecified: Secondary | ICD-10-CM

## 2011-08-12 LAB — CBC WITH DIFFERENTIAL/PLATELET
BASO%: 0.6 % (ref 0.0–2.0)
Basophils Absolute: 0.1 10*3/uL (ref 0.0–0.1)
EOS%: 1.7 % (ref 0.0–7.0)
HGB: 14.5 g/dL (ref 13.0–17.1)
MCH: 26.6 pg — ABNORMAL LOW (ref 27.2–33.4)
RDW: 14.7 % — ABNORMAL HIGH (ref 11.0–14.6)
lymph#: 5.5 10*3/uL — ABNORMAL HIGH (ref 0.9–3.3)

## 2011-08-12 LAB — MORPHOLOGY: PLT EST: ADEQUATE

## 2011-08-12 NOTE — Patient Instructions (Addendum)
A.  Rheumatoid Arthritis:  Continue treatment with Rheumatology. B.  Elevated WBC:  Most likely reactive.  Bone marrow biopsy was nonrevealing.  No indication for further work up at this time.  I will discharge you back to your PCP.  In the future, if despite controlling your Rheumatoid Arthritis, and your WBC >20, I may consider repeating bone marrow biopsy. C.  Interstitial lung disease:  Most likely rheumatoid arthritis.  Cannot rule out other etiologies.  Please follow up with your pulmonologist Dr. Vassie Loll.     Thank you.

## 2011-08-12 NOTE — Progress Notes (Signed)
[facility]  Telephone:(336) [phone] Fax:(336) [phone]   OFFICE PROGRESS NOTE   Cc:  Josue Hector, MD, MD  DIAGNOSIS:   Reactive leukocytosis.   CURRENT THERAPY: Watchful observation. Treating underlying rheumatoid arthritis  INTERVAL HISTORY: Curtis Flores 56 y.o. male returns for regular follow up with his wife.  He has been on Embrel for the past two months with improvement of his diffuse joint pain especially in his fingers.  However, he is still not able to perform certain activities in the morning due to stiff joints.  He is not able to cary heavy cup or pots due to the joint pain.  He is otherwise able to ambulate since his joint pain in the hips is not too severe.   Patient denies fatigue, fever, headache, visual changes, confusion, drenching night sweats, palpable lymph node swelling, mucositis, odynophagia, dysphagia, nausea vomiting, jaundice, chest pain, palpitation, shortness of breath, dyspnea on exertion, productive cough, gum bleeding, epistaxis, hematemesis, hemoptysis, abdominal pain, abdominal swelling, early satiety, melena, hematochezia, hematuria, skin rash, spontaneous bleeding, heat or cold intolerance, bowel bladder incontinence, back pain, focal motor weakness, paresthesia, depression, suicidal or homocidal ideation, feeling hopelessness.   Past Medical History  Diagnosis Date  . Psoriasis 2003  . Rheumatoid arthritis 2012  . Psoriatic arthritis   . Leukocytosis   . Lymphadenopathy   . Pulmonary fibrosis   . Hypertension   . GERD (gastroesophageal reflux disease)     No past surgical history on file.  Current Outpatient Prescriptions  Medication Sig Dispense Refill  . ENBREL SURECLICK 50 MG/ML injection Inject 50 mg into the skin once a week.       [name] lisinopril-hydrochlorothiazide (PRINZIDE,ZESTORETIC) 20-25 MG per tablet Take 1 tablet by mouth daily.      . simvastatin (ZOCOR) 10 MG tablet Take 10 mg by mouth at bedtime.          ALLERGIES:   has no known allergies.  REVIEW OF SYSTEMS:  The rest of the 14-point review of system was negative.   Filed Vitals:   08/12/11 1310  BP: 161/83  Pulse: 55  Temp: 97 F (36.1 C)   Wt Readings from Last 3 Encounters:  08/12/11 192 lb 9.6 oz (87.363 kg)  04/24/11 188 lb 3.2 oz (85.367 kg)  04/17/11 180 lb 4.8 oz (81.784 kg)   ECOG Performance status: 1  PHYSICAL EXAMINATION:   General: well-nourished man, in no acute distress. Eyes: no scleral icterus. ENT: There were no oropharyngeal lesions. Neck was without thyromegaly. Lymphatics: Negative cervical, supraclavicular or axillary adenopathy. Respiratory: lungs were clear bilaterally without wheezing or crackles. Cardiovascular: Regular rate and rhythm, S1/S2, without murmur, rub or gallop. There was no pedal edema. GI: abdomen was soft, flat, nontender, nondistended, without organomegaly. Muscoloskeletal: no spinal tenderness;  upon palpation of enlarged distal interphalangeal joint swelling, there was no pain. Skin exam was without echymosis, petichae. There was patches of lighter skin discoloration and erythema in the face and chest. Neuro exam was nonfocal. Patient was able to get on and off exam table without assistance. Gait was normal. Patient was alerted and oriented. Attention was good. Language was appropriate. Mood was normal without depression. Speech was not pressured. Thought content was not tangential      LABORATORY/RADIOLOGY DATA:  Lab Results  Component Value Date   WBC 14.5* 08/12/2011   HGB 14.5 08/12/2011   HCT 44.7 08/12/2011   PLT 240 08/12/2011   GLUCOSE 94 04/10/2011   ALKPHOS 72 04/10/2011  ALT 12 04/10/2011   AST 15 04/10/2011   NA 137 04/10/2011   K 4.2 04/10/2011   CL 99 04/10/2011   CREATININE 0.63 04/10/2011   BUN 14 04/10/2011   CO2 25 04/10/2011    ASSESSMENT AND PLAN:   1.  Rheumatoid Arthritis:  Continue treatment with Embrel per Rheumatology.  2.  Leukocytosis:  Most likely reactive.   Bone marrow biopsy was nonrevealing.  No indication for further work up at this time.  I will discharge patient back to his PCP and Rheumatology.  In the future, if despite controlling his rheumatoid arthritis, and his WBC >20, I may consider repeating bone marrow biopsy.  3.  Interstitial lung disease:  Most likely rheumatoid arthritis.  Cannot rule out other etiologies. Dr. Vassie Loll recommended bronchoscopy in the past; however, patient has not made up his mid yet.  I advised him to follow up with Dr. Vassie Loll.    4.  Dispo:  [facility].  My service is always available in the future if the need ever arises.  Thank you for this referral.      The length of time of the face-to-face encounter was 15  minutes. More than 50% of time was spent counseling and coordination of care.

## 2011-08-20 NOTE — Progress Notes (Signed)
Received lab results from [facility] ordered by Dr. Fatima Sanger; forwarded to Dr. Gaylyn Rong.

## 2011-10-14 ENCOUNTER — Encounter: Payer: Self-pay | Admitting: Oncology

## 2011-10-14 NOTE — Progress Notes (Signed)
Patient wife call me this afternoon to let me know that they had received a bill.I told her to call the number on the bill and let them know that her husband has a discount in the system.

## 2012-04-24 ENCOUNTER — Encounter: Payer: Self-pay | Admitting: *Deleted

## 2012-04-24 NOTE — Progress Notes (Signed)
CBC received from [facility] and forwarded to Dr. Gaylyn Rong for review.  WBC 13.7.

## 2012-08-30 ENCOUNTER — Telehealth: Payer: Self-pay | Admitting: Pulmonary Disease

## 2012-08-30 NOTE — Telephone Encounter (Signed)
Reviewed rheum note- Pl make FU appt with/ after PFTs

## 2012-09-01 NOTE — Telephone Encounter (Signed)
lmomtcb x1 

## 2012-09-07 NOTE — Telephone Encounter (Signed)
I called pt. He stated he will call back once he speaks with his wife and looks at there schedule. He is not sure when this can be scheduled. Pt aware he needs PFT with OV. Nothing further was needed

## 2012-10-22 ENCOUNTER — Ambulatory Visit (INDEPENDENT_AMBULATORY_CARE_PROVIDER_SITE_OTHER)
Admission: RE | Admit: 2012-10-22 | Discharge: 2012-10-22 | Disposition: A | Discharge status: Home / Self Care | Payer: BC Managed Care – PPO | Source: Ambulatory Visit | Attending: Pulmonary Disease | Admitting: Pulmonary Disease

## 2012-10-22 ENCOUNTER — Ambulatory Visit (INDEPENDENT_AMBULATORY_CARE_PROVIDER_SITE_OTHER): Payer: BC Managed Care – PPO | Admitting: Pulmonary Disease

## 2012-10-22 ENCOUNTER — Encounter: Payer: Self-pay | Admitting: Pulmonary Disease

## 2012-10-22 VITALS — BP 104/56 | HR 51 | Temp 97.0°F | Ht 67.0 in | Wt 197.0 lb

## 2012-10-22 DIAGNOSIS — J8410 Pulmonary fibrosis, unspecified: Secondary | ICD-10-CM

## 2012-10-22 LAB — PULMONARY FUNCTION TEST

## 2012-10-22 NOTE — Progress Notes (Signed)
  Subjective:    Patient ID: Curtis Flores, male    DOB: 06-09-1955, 57 y.o.   MRN: [id number]  HPI  Rheum - [name]   57/M, ex smoker for FU of ILD He has psoriasis since 2001, rheumatoid arthritis was diagnosed 2012 when he presented with a symmetric polyarthritis of elbows, wrists, hands, neck, shoulders & feet  CXR at [facility] showed bibasal interstitial fibrosis with hilar lymphadenopathy  With POS  RA 89 &  CCP 122, ANA neg, HLA B 27 neg, CK 87  PPD neg, hep panel neg  HRCT showed extensive regions of ground-glass attenuation and most pronounced in the lower lobes of the lungs bilaterally. These areas demonstrate internal interstitial prominence with a large amount of  microcystic honeycombing (no macrocystic honeycombing identified), with some cylindrical bronchiectasis and bronchiolectasis. There was sparing of the immediate subpleural lung in the regions of involvement, reactive medistinal Lns.Coronary artery calcification & moderate hiatal hernia was also noted.  Prednisone taper helped his symptoms, methotrexate was started but not continued due to his fibrosis.  Seen by Dr Gaylyn Rong for persistent leucocytosis, bone marrow biopsy neg     10/22/2012 18 m FU He denies dyspnea, cough or wheeze, there are no h/o skin lesions on his shin Reviewed rheum note- Change from Enbrel to Humira is planned - had good response to prednisone PFTs 2013 showed intraprenchymal restricton with FVC around 71% & DLCO 56% c/w ILD, no airway obstruction.  Rpt PFTs 10/22/12 show mild drop in FVC to 66%, DLCO preserved He desats to 92 % on 3 laps around the office.  CXR - no change in scarring  Review of Systems neg for any significant sore throat, dysphagia, itching, sneezing, nasal congestion or excess/ purulent secretions, fever, chills, sweats, unintended wt loss, pleuritic or exertional cp, hempoptysis, orthopnea pnd or change in chronic leg swelling. Also denies presyncope, palpitations,  heartburn, abdominal pain, nausea, vomiting, diarrhea or change in bowel or urinary habits, dysuria,hematuria, rash, arthralgias, visual complaints, headache, numbness weakness or ataxia.     Objective:   Physical Exam  Gen. Pleasant, well-nourished, in no distress ENT - no lesions, no post nasal drip Neck: No JVD, no thyromegaly, no carotid bruits Lungs: no use of accessory muscles, no dullness to percussion, bibasal 13 rales, no rhonchi  Cardiovascular: Rhythm regular, heart sounds  normal, no murmurs or gallops, no peripheral edema Musculoskeletal: No deformities, no cyanosis or clubbing         Assessment & Plan:

## 2012-10-22 NOTE — Patient Instructions (Addendum)
CXR today Lung function is slight decreased  You have scarring in your lung bases likely due to rheumatoid arthritis

## 2012-10-22 NOTE — Progress Notes (Signed)
PFT done today. 

## 2012-10-23 ENCOUNTER — Telehealth: Payer: Self-pay | Admitting: *Deleted

## 2012-10-23 NOTE — Telephone Encounter (Signed)
Pt called and lvm requesting xray results from yesterday. Call back [phone].

## 2012-10-23 NOTE — Telephone Encounter (Signed)
See results note. 

## 2012-10-23 NOTE — Assessment & Plan Note (Signed)
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 NO primary therapy to target lungs at this time - will follow lung function with pFTs annually , sooner if symptomatic We discussed signs of bronchitis

## 2012-10-23 NOTE — Telephone Encounter (Signed)
Pl look at result note

## 2012-11-03 ENCOUNTER — Encounter: Payer: Self-pay | Admitting: Pulmonary Disease

## 2012-11-13 ENCOUNTER — Encounter: Payer: Self-pay | Admitting: *Deleted

## 2012-11-13 NOTE — Progress Notes (Signed)
CBC received from [facility].  WBC 12.4.

## 2013-11-01 ENCOUNTER — Other Ambulatory Visit (HOSPITAL_COMMUNITY): Payer: Self-pay | Admitting: Rheumatology

## 2013-11-01 ENCOUNTER — Ambulatory Visit (HOSPITAL_COMMUNITY)
Admission: RE | Admit: 2013-11-01 | Discharge: 2013-11-01 | Disposition: A | Discharge status: Home / Self Care | Payer: BC Managed Care – PPO | Source: Ambulatory Visit | Attending: Rheumatology | Admitting: Rheumatology

## 2013-11-01 DIAGNOSIS — R0989 Other specified symptoms and signs involving the circulatory and respiratory systems: Secondary | ICD-10-CM

## 2013-11-01 DIAGNOSIS — R0602 Shortness of breath: Secondary | ICD-10-CM | POA: Insufficient documentation

## 2013-11-01 DIAGNOSIS — R509 Fever, unspecified: Secondary | ICD-10-CM | POA: Insufficient documentation

## 2013-12-21 DIAGNOSIS — I10 Essential (primary) hypertension: Secondary | ICD-10-CM | POA: Diagnosis not present

## 2013-12-21 DIAGNOSIS — K219 Gastro-esophageal reflux disease without esophagitis: Secondary | ICD-10-CM | POA: Diagnosis not present

## 2013-12-21 DIAGNOSIS — Z23 Encounter for immunization: Secondary | ICD-10-CM | POA: Diagnosis not present

## 2014-02-02 DIAGNOSIS — L4050 Arthropathic psoriasis, unspecified: Secondary | ICD-10-CM | POA: Diagnosis not present

## 2014-02-02 DIAGNOSIS — M0579 Rheumatoid arthritis with rheumatoid factor of multiple sites without organ or systems involvement: Secondary | ICD-10-CM | POA: Diagnosis not present

## 2014-02-02 DIAGNOSIS — M25511 Pain in right shoulder: Secondary | ICD-10-CM | POA: Diagnosis not present

## 2014-02-02 DIAGNOSIS — D709 Neutropenia, unspecified: Secondary | ICD-10-CM | POA: Diagnosis not present

## 2014-06-08 DIAGNOSIS — M79641 Pain in right hand: Secondary | ICD-10-CM | POA: Diagnosis not present

## 2014-06-08 DIAGNOSIS — M79672 Pain in left foot: Secondary | ICD-10-CM | POA: Diagnosis not present

## 2014-06-08 DIAGNOSIS — M79642 Pain in left hand: Secondary | ICD-10-CM | POA: Diagnosis not present

## 2014-06-08 DIAGNOSIS — M79671 Pain in right foot: Secondary | ICD-10-CM | POA: Diagnosis not present

## 2014-06-08 DIAGNOSIS — M0579 Rheumatoid arthritis with rheumatoid factor of multiple sites without organ or systems involvement: Secondary | ICD-10-CM | POA: Diagnosis not present

## 2014-06-08 DIAGNOSIS — L4050 Arthropathic psoriasis, unspecified: Secondary | ICD-10-CM | POA: Diagnosis not present

## 2014-06-22 DIAGNOSIS — K219 Gastro-esophageal reflux disease without esophagitis: Secondary | ICD-10-CM | POA: Diagnosis not present

## 2014-06-22 DIAGNOSIS — M069 Rheumatoid arthritis, unspecified: Secondary | ICD-10-CM | POA: Diagnosis not present

## 2014-06-22 DIAGNOSIS — E784 Other hyperlipidemia: Secondary | ICD-10-CM | POA: Diagnosis not present

## 2014-06-22 DIAGNOSIS — I10 Essential (primary) hypertension: Secondary | ICD-10-CM | POA: Diagnosis not present

## 2014-09-30 ENCOUNTER — Ambulatory Visit (HOSPITAL_COMMUNITY): Payer: Self-pay | Admitting: Oncology

## 2014-10-03 ENCOUNTER — Ambulatory Visit (HOSPITAL_COMMUNITY): Payer: Self-pay | Admitting: Oncology

## 2014-10-05 ENCOUNTER — Ambulatory Visit (HOSPITAL_COMMUNITY): Payer: Self-pay | Admitting: Oncology

## 2014-10-07 ENCOUNTER — Encounter (HOSPITAL_COMMUNITY): Payer: BLUE CROSS/BLUE SHIELD

## 2014-10-07 ENCOUNTER — Encounter (HOSPITAL_COMMUNITY): Payer: Self-pay | Admitting: Oncology

## 2014-10-07 ENCOUNTER — Encounter (HOSPITAL_COMMUNITY): Payer: BLUE CROSS/BLUE SHIELD | Attending: Oncology | Admitting: Oncology

## 2014-10-07 VITALS — BP 156/53 | HR 52 | Temp 98.5°F | Resp 18 | Ht 67.0 in | Wt 184.6 lb

## 2014-10-07 DIAGNOSIS — D649 Anemia, unspecified: Secondary | ICD-10-CM | POA: Diagnosis not present

## 2014-10-07 DIAGNOSIS — D72829 Elevated white blood cell count, unspecified: Secondary | ICD-10-CM | POA: Diagnosis not present

## 2014-10-07 DIAGNOSIS — R591 Generalized enlarged lymph nodes: Secondary | ICD-10-CM | POA: Insufficient documentation

## 2014-10-07 DIAGNOSIS — I10 Essential (primary) hypertension: Secondary | ICD-10-CM

## 2014-10-07 DIAGNOSIS — M069 Rheumatoid arthritis, unspecified: Secondary | ICD-10-CM

## 2014-10-07 DIAGNOSIS — Z87891 Personal history of nicotine dependence: Secondary | ICD-10-CM

## 2014-10-07 LAB — COMPREHENSIVE METABOLIC PANEL
ALBUMIN: 3.2 g/dL — AB (ref 3.5–5.0)
ALK PHOS: 56 U/L (ref 38–126)
ALT: 13 U/L — ABNORMAL LOW (ref 17–63)
AST: 20 U/L (ref 15–41)
Anion gap: 5 (ref 5–15)
BILIRUBIN TOTAL: 0.5 mg/dL (ref 0.3–1.2)
BUN: 9 mg/dL (ref 6–20)
CALCIUM: 8.2 mg/dL — AB (ref 8.9–10.3)
CO2: 29 mmol/L (ref 22–32)
Chloride: 103 mmol/L (ref 101–111)
Creatinine, Ser: 0.55 mg/dL — ABNORMAL LOW (ref 0.61–1.24)
GFR calc non Af Amer: >60 mL/min (ref >60)
GLUCOSE: 83 mg/dL (ref 65–99)
Potassium: 3.7 mmol/L (ref 3.5–5.1)
Sodium: 137 mmol/L (ref 135–145)
Total Protein: 7.5 g/dL (ref 6.5–8.1)

## 2014-10-07 LAB — CBC WITH DIFFERENTIAL/PLATELET
BASOS PCT: 1 % (ref 0–1)
Basophils Absolute: 0.1 10*3/uL (ref 0.0–0.1)
Eosinophils Absolute: 0.3 10*3/uL (ref 0.0–0.7)
Eosinophils Relative: 3 % (ref 0–5)
HEMATOCRIT: 37.7 % — AB (ref 39.0–52.0)
Hemoglobin: 11.8 g/dL — ABNORMAL LOW (ref 13.0–17.0)
Lymphocytes Relative: 31 % (ref 12–46)
Lymphs Abs: 3.4 10*3/uL (ref 0.7–4.0)
MCH: 24.9 pg — AB (ref 26.0–34.0)
MCHC: 31.3 g/dL (ref 30.0–36.0)
MCV: 79.7 fL (ref 78.0–100.0)
Monocytes Absolute: 1 10*3/uL (ref 0.1–1.0)
Monocytes Relative: 9 % (ref 3–12)
Neutro Abs: 6.4 10*3/uL (ref 1.7–7.7)
Neutrophils Relative %: 56 % (ref 43–77)
Platelets: 357 10*3/uL (ref 150–400)
RBC: 4.73 MIL/uL (ref 4.22–5.81)
RDW: 15.9 % — ABNORMAL HIGH (ref 11.5–15.5)
WBC: 11.2 10*3/uL — ABNORMAL HIGH (ref 4.0–10.5)

## 2014-10-07 NOTE — Assessment & Plan Note (Addendum)
Noted in mediastinum and hilar regions of chest, thought to be secondary to interstitial lung disease.  No recent CT imaging.  Chest xrays reviewed.  May need to consider CT of chest in future to evaluate lymphadenopathy.

## 2014-10-07 NOTE — Assessment & Plan Note (Signed)
Leukocytosis with neutrophil and monocyte predominance, previously worked-up by Dr. Lamonte Sakai ([facility]) in 2013, including a BMBX in 2013 and negative Jak2 testing.  Thought to be secondary to RA in past.  Labs today: CBC diff, CMET, BCR/ABL, peripheral smear for pathology review, and peripheral flow cytometry.  Return in 4 weeks for follow-up.

## 2014-10-07 NOTE — Patient Instructions (Signed)
.  Curtis Flores at [facility] Discharge Instructions  RECOMMENDATIONS MADE BY THE CONSULTANT AND ANY TEST RESULTS WILL BE SENT TO YOUR REFERRING PHYSICIAN.  Labs today  Return in 4 weeks  Thank you for choosing [facility] to provide your oncology and hematology care.  To afford each patient quality time with our provider, please arrive at least 15 minutes before your scheduled appointment time.    You need to re-schedule your appointment should you arrive 10 or more minutes late.  We strive to give you quality time with our providers, and arriving late affects you and other patients whose appointments are after yours.  Also, if you no show three or more times for appointments you may be dismissed from the clinic at the providers discretion.     Again, thank you for choosing [facility].  Our hope is that these requests will decrease the amount of time that you wait before being seen by our physicians.       _____________________________________________________________  Should you have questions after your visit to [facility], please contact our office at [phone] between the hours of 8:30 a.m. and 4:30 p.m.  Voicemails left after 4:30 p.m. will not be returned until the following business day.  For prescription refill requests, have your pharmacy contact our office.

## 2014-10-07 NOTE — Progress Notes (Signed)
[facility] Hematology/Oncology Consultation   Name: Curtis Flores      MRN: [id number]    Date: 10/07/2014 Time:1:02 PM   REFERRING PHYSICIAN:  Dr. Estanislado Pandy  REASON FOR CONSULT:  Leukocytosis   DIAGNOSIS:  Leukocytosis with neutrophilia and monocytosis  HISTORY OF PRESENT ILLNESS:   Curtis Flores is a pleasant 59 yo black American man with a past medical history significant for rheumatoid arthritis, pulmonary fibrosis, psoriasis, HTN, hypercholesterolemia, lymphadenopathy in the mediastinum and hilar area consistent to reactive interstitial lung disease, and history of leukocytosis that was worked up by Dr. Sherryl Manges ([facility]) in 2013 and was felt to be reactive.  Chart reviewed.  I personally reviewed and went over laboratory results with the patient.  The results are noted within this dictation. No recent labs available to me.  I personally reviewed and went over radiographic studies with the patient.  The results are noted within this dictation.  CT of chest reviewed from 2013 and reactive lymphadenopathy appreciated.  No CT follow-up noted in [facility]  The patient remembers his work-up with Dr. Lamonte Sakai in 2013.  He remembers the bone marrow biopsy being negative for any malignancy findings.    The patient reports that he feels well.  He denies any B symptoms.  His appetite is strong.  He denies any weight loss.  He denies any GI issues.  He denies any cough, sputum production, hemoptysis.  He is currently on Humira for RA treatment.  He has been on this x 1 year.  He was previously on Enbrel.     PAST MEDICAL HISTORY:   Past Medical History  Diagnosis Date  . Psoriasis 2003  . Rheumatoid arthritis(714.0) 2012  . Psoriatic arthritis   . Leukocytosis   . Lymphadenopathy   . Pulmonary fibrosis   . Hypertension   . GERD (gastroesophageal reflux disease)     ALLERGIES: No Known Allergies    MEDICATIONS: I have reviewed the patient's current medications.    Current  Outpatient Prescriptions on File Prior to Visit  Medication Sig Dispense Refill  . lisinopril-hydrochlorothiazide (PRINZIDE,ZESTORETIC) 20-25 MG per tablet Take 1 tablet by mouth daily.    . simvastatin (ZOCOR) 10 MG tablet Take 10 mg by mouth at bedtime.      No current facility-administered medications on file prior to visit.     PAST SURGICAL HISTORY History reviewed. No pertinent past surgical history.  FAMILY HISTORY: Father passed in his 80's from unknown reason Mother passed at 47 from MI and history of EtOHism He had 5 brothers, all deceased via MVA.  He has 1 son and 1 daughter (46 and 92 respectively).  He has an uncle with RA.  SOCIAL HISTORY:  reports that he quit smoking about 11 years ago. His smoking use included Cigarettes. He has a 30 pack-year smoking history. He has never used smokeless tobacco. He reports that he does not drink alcohol or use illicit drugs.  PERFORMANCE STATUS: The patient's performance status is 1 - Symptomatic but completely ambulatory  PHYSICAL EXAM: Most Recent Vital Signs: Blood pressure 156/53, pulse 52, temperature 98.5 F (36.9 C), resp. rate 18, height _0  (1.702 m), weight 184 lb 9.6 oz (83.734 kg), SpO2 97 %. General appearance: alert, cooperative, appears stated age, no distress and accompanied by his wife. Head: Normocephalic, without obvious abnormality, atraumatic Eyes: negative findings: lids and lashes normal, conjunctivae and sclerae normal, corneas clear and pupils equal, round, reactive to light  and accomodation Throat: normal findings: lips normal without lesions, buccal mucosa normal, tongue midline and normal, soft palate, uvula, and tonsils normal and oropharynx pink & moist without lesions or evidence of thrush and upper dentures in place Neck: no adenopathy, supple, symmetrical, trachea midline and thyroid not enlarged, symmetric, no tenderness/mass/nodules Lungs: clear to auscultation bilaterally and normal percussion  bilaterally Heart: regular rate and rhythm, S1, S2 normal, no murmur, click, rub or gallop Abdomen: soft, non-tender; bowel sounds normal; no masses,  no organomegaly Extremities: extremities normal, atraumatic, no cyanosis or edema, changes of hands associated with RA (ulnar deviation, PIP edema). Skin: Skin color, texture, turgor normal. No rashes or lesions Lymph nodes: Cervical, supraclavicular, and axillary nodes normal. Neurologic: Alert and oriented X 3, normal strength and tone. Normal symmetric reflexes. Normal coordination and gait  LABORATORY DATA:   Outside laboratory studies for review.     PATHOLOGY:    04/09/2014  Diagnosis Bone Marrow, Aspirate,Biopsy, and Clot, left posterior iliac crest - NORMOCELLULAR BONE MARROW FOR AGE WITH TRILINEAGE HEMATOPOIESIS. - A FEW SMALL LYMPHOID AGGREGATES PRESENT. - INCREASED IRON STORES.   ASSESSMENT/PLAN:   Leukocytosis Leukocytosis with neutrophil and monocyte predominance, previously worked-up by Dr. Lamonte Sakai ([facility]) in 2013, including a BMBX in 2013 and negative Jak2 testing.  Thought to be secondary to RA in past.  Labs today: CBC diff, CMET, BCR/ABL, peripheral smear for pathology review, and peripheral flow cytometry.  Return in 4 weeks for follow-up.  Lymphadenopathy Noted in mediastinum and hilar regions of chest, thought to be secondary to interstitial lung disease.  No recent CT imaging.  Chest xrays reviewed.  May need to consider CT of chest in future to evaluate lymphadenopathy.   I discussed with the patient depending upon the results of his laboratory studies obtained today I may recommend CT imaging of the chest at follow-up. He understands the plan as detailed in degrees.  All questions were answered. The patient knows to call the clinic with any problems, questions or concerns. We can certainly see the patient much sooner if necessary.  This note is electronically signed by: Molli Hazard, MD   10/07/2014 1:02 PM

## 2014-10-10 LAB — PATHOLOGIST SMEAR REVIEW

## 2014-10-12 ENCOUNTER — Ambulatory Visit (HOSPITAL_COMMUNITY): Payer: Self-pay | Admitting: Oncology

## 2014-10-12 LAB — BCR-ABL1, CML/ALL, PCR, QUANT

## 2014-11-09 ENCOUNTER — Encounter (HOSPITAL_COMMUNITY): Payer: Self-pay | Admitting: Hematology & Oncology

## 2014-11-09 ENCOUNTER — Encounter (HOSPITAL_COMMUNITY): Payer: BLUE CROSS/BLUE SHIELD | Attending: Oncology | Admitting: Hematology & Oncology

## 2014-11-09 VITALS — BP 170/153 | HR 52 | Temp 98.1°F | Resp 18 | Wt 183.3 lb

## 2014-11-09 DIAGNOSIS — R591 Generalized enlarged lymph nodes: Secondary | ICD-10-CM

## 2014-11-09 DIAGNOSIS — D72829 Elevated white blood cell count, unspecified: Secondary | ICD-10-CM | POA: Diagnosis not present

## 2014-11-09 NOTE — Progress Notes (Signed)
[facility] Hematology/Oncology Consultation   Name: Curtis Flores      MRN: [id number]    Date: 11/09/2014 Time:5:53 PM   REFERRING PHYSICIAN:  Dr. Estanislado Pandy  REASON FOR CONSULT:  Leukocytosis   DIAGNOSIS:  Leukocytosis with neutrophilia and monocytosis  HISTORY OF PRESENT ILLNESS:   Curtis Flores is a pleasant 59 yo black American man with a past medical history significant for rheumatoid arthritis, pulmonary fibrosis, psoriasis, HTN, hypercholesterolemia, lymphadenopathy in the mediastinum and hilar area consistent to reactive interstitial lung disease, and history of leukocytosis that was worked up by Dr. Sherryl Manges ([facility]) in 2013 and was felt to be reactive.  The patient remembers his work-up with Dr. Lamonte Sakai in 2013.  He remembers the bone marrow biopsy being negative for any malignancy findings.    The patient reports that he feels well.  He denies any B symptoms.  His appetite is strong.  He denies any weight loss.  He denies any GI issues.  He denies any cough, sputum production, hemoptysis.  He is currently on Humira for RA treatment.  He has been on this x 1 year.  He was previously on Enbrel.    Curtis Flores is here today with his wife. He is calm and pleasant.  His blood pressure is very high today, causing some concern. He notes he has follow-up with his PCP in the next several weeks. He also checks his BP at a local pharmacy.  Curtis Flores says his appetite is great.   PAST MEDICAL HISTORY:   Past Medical History  Diagnosis Date  . Psoriasis 2003  . Rheumatoid arthritis(714.0) 2012  . Psoriatic arthritis   . Leukocytosis   . Lymphadenopathy   . Pulmonary fibrosis   . Hypertension   . GERD (gastroesophageal reflux disease)     ALLERGIES: No Known Allergies    MEDICATIONS: I have reviewed the patient's current medications.    Current Outpatient Prescriptions on File Prior to Visit  Medication Sig Dispense Refill  . HUMIRA PEN 40 MG/0.8ML PNKT     .  leflunomide (ARAVA) 20 MG tablet     . lisinopril-hydrochlorothiazide (PRINZIDE,ZESTORETIC) 20-25 MG per tablet Take 1 tablet by mouth daily.    [name] OTEZLA 30 MG TABS     . simvastatin (ZOCOR) 10 MG tablet Take 10 mg by mouth at bedtime.      No current facility-administered medications on file prior to visit.     PAST SURGICAL HISTORY No past surgical history on file.  FAMILY HISTORY: Father passed in his 17's from unknown reason Mother passed at 31 from MI and history of EtOHism He had 5 brothers, all deceased via MVA.  He has 1 son and 1 daughter (7 and 61 respectively).  He has an uncle with RA.  SOCIAL HISTORY:  reports that he quit smoking about 11 years ago. His smoking use included Cigarettes. He has a 30 pack-year smoking history. He has never used smokeless tobacco. He reports that he does not drink alcohol or use illicit drugs.  PERFORMANCE STATUS: The patient's performance status is 1 - Symptomatic but completely ambulatory  ROS: 14 point review of systems was performed and is negative except as detailed under history of present illness and above  PHYSICAL EXAM: Most Recent Vital Signs: Blood pressure 170/153, pulse 52, temperature 98.1 F (36.7 C), temperature source Oral, resp. rate 18, weight 183 lb 4.8 oz (83.144 kg). General appearance: alert, cooperative, appears stated age,  no distress and accompanied by his wife. Head: Normocephalic, without obvious abnormality, atraumatic Eyes: negative findings: lids and lashes normal, conjunctivae and sclerae normal, corneas clear and pupils equal, round, reactive to light and accomodation Throat: normal findings: lips normal without lesions, buccal mucosa normal, tongue midline and normal, soft palate, uvula, and tonsils normal and oropharynx pink & moist without lesions or evidence of thrush and upper dentures in place Neck: no adenopathy, supple, symmetrical, trachea midline and thyroid not enlarged, symmetric, no  tenderness/mass/nodules Lungs: clear to auscultation bilaterally and normal percussion bilaterally Heart: regular rate and rhythm, S1, S2 normal, no murmur, click, rub or gallop Abdomen: soft, non-tender; bowel sounds normal; no masses,  no organomegaly Extremities: extremities normal, atraumatic, no cyanosis or edema, changes of hands associated with RA (ulnar deviation, PIP edema). Skin: Skin color, texture, turgor normal. No rashes or lesions Lymph nodes: Cervical, supraclavicular, and axillary nodes normal. Neurologic: Alert and oriented X 3, normal strength and tone. Normal symmetric reflexes. Normal coordination and gait   LABORATORY DATA:   Outside laboratory studies for review.  Results for Curtis, Flores (MRN [id number]) as of 11/09/2014 17:53  Ref. Range 10/07/2014 13:26  Sodium Latest Ref Range: 135-145 mmol/L 137  Potassium Latest Ref Range: 3.5-5.1 mmol/L 3.7  Chloride Latest Ref Range: 101-111 mmol/L 103  CO2 Latest Ref Range: 22-32 mmol/L 29  BUN Latest Ref Range: 6-20 mg/dL 9  Creatinine Latest Ref Range: 0.61-1.24 mg/dL 0.55 (L)  Calcium Latest Ref Range: 8.9-10.3 mg/dL 8.2 (L)  EGFR (Non-African Amer.) Latest Ref Range: >60 mL/min >60  EGFR (African American) Latest Ref Range: >60 mL/min >60  Glucose Latest Ref Range: 65-99 mg/dL 83  Anion gap Latest Ref Range: 5-15  5  Alkaline Phosphatase Latest Ref Range: 38-126 U/L 56  Albumin Latest Ref Range: 3.5-5.0 g/dL 3.2 (L)  AST Latest Ref Range: 15-41 U/L 20  ALT Latest Ref Range: 17-63 U/L 13 (L)  Total Protein Latest Ref Range: 6.5-8.1 g/dL 7.5  Total Bilirubin Latest Ref Range: 0.3-1.2 mg/dL 0.5  WBC Latest Ref Range: 4.0-10.5 K/uL 11.2 (H)  RBC Latest Ref Range: 4.22-5.81 MIL/uL 4.73  Hemoglobin Latest Ref Range: 13.0-17.0 g/dL 11.8 (L)  HCT Latest Ref Range: 39.0-52.0 % 37.7 (L)  MCV Latest Ref Range: 78.0-100.0 fL 79.7  MCH Latest Ref Range: 26.0-34.0 pg 24.9 (L)  MCHC Latest Ref Range: 30.0-36.0 g/dL 31.3  RDW  Latest Ref Range: 11.5-15.5 % 15.9 (H)  Platelets Latest Ref Range: 150-400 K/uL 357  Neutrophils Latest Ref Range: 43-77 % 56  Lymphocytes Latest Ref Range: 12-46 % 31  Monocytes Relative Latest Ref Range: 3-12 % 9  Eosinophil Latest Ref Range: 0-5 % 3  Basophil Latest Ref Range: 0-1 % 1  NEUT# Latest Ref Range: 1.7-7.7 K/uL 6.4  Lymphocyte # Latest Ref Range: 0.7-4.0 K/uL 3.4  Monocyte # Latest Ref Range: 0.1-1.0 K/uL 1.0  Eosinophils Absolute Latest Ref Range: 0.0-0.7 K/uL 0.3  Basophils Absolute Latest Ref Range: 0.0-0.1 K/uL 0.1   Interpretation (BCRAL):  Comment   Comments: (NOTE)  The quantitative RT-PCR assay is negative for the b2a2 and b3a2  (p210) and e1a2 (p190) fusion gene transcripts found in chronic  myelogenous leukemia and Philadelphia positive acute lymphocytic  leukemia. These results do not rule out the presence of low levels of  BCR-ABL1 transcript below the level of detection of this assay, or  the presence of rare BCR-ABL1 transcripts not detected by this assay.        Pathologist smear  review  Status: Finalresult Visible to patient:  Not Released Nextappt: 05/09/2015 at 12:20 PM in Oncology ([facility]) Dx:  Leukocytosis         16moago    Path Review Reviewed By BViolet Baldy M.D.   Comments: 08.08.16  NORMOCYTIC ANEMIA,  MILD LEUKOCYTOSIS.  [facility]      Specimen Collected: 10/07/14 1:26 PM Last Resulted: 10/10/14 10:31 AM           PATHOLOGY:    04/09/2014  Diagnosis Bone Marrow, Aspirate,Biopsy, and Clot, left posterior iliac crest - NORMOCELLULAR BONE MARROW FOR AGE WITH TRILINEAGE HEMATOPOIESIS. - A FEW SMALL LYMPHOID AGGREGATES PRESENT. - INCREASED IRON STORES.  FLOW CYTOMETRY REPORT INTERPRETATION Interpretation Peripheral Blood Flow Cytometry - PREDOMINANCE OF T LYMPHOCYTES WITH NO ABERRANT PHENOTYPE. - MINOR B-CELL POPULATION  IDENTIFIED. Diagnosis Comment: The majority of lymphocytes consists of T cells with no aberrant phenotype. B cells represent a minor population (less than 10% of lymphocytes) with extremely dim staining for surface immunoglobulin light chains that hinder assessment and/or quantitation of clonality. However, an abnormal B-cell phenotype such as expression of CD10 or CD5 is not identified. Clinical correlation is recommended. (BNS:ecj 10/11/2014) BSusanne GreenhouseMD Pathologist, Electronic Signature (Case signed 10/11/2014) GROSS AND MICROSCOPIC INFORMATION Specimen Clinical Information leukocytosis Source Peripheral Blood Flow Cytometry Microscopic   ASSESSMENT/PLAN:  Leukocytosis, reactive Negative workup in 2013, including negative bone marrow biopsy. Rheumatoid arthritis Hypertension  I reviewed the patient's laboratory studies with him in detail and advised him that at this point I feel his leukocytosis is reactive and secondary to his RA. I advised him however that patients with rheumatoid arthritis are more prone to develop lymphoproliferative disease and I feel that he needs to be followed at least every 6 months. He is agreeable to this. We will therefore plan on a 6 month return with repeat CBC and peripheral smear review.  He will follow-up with his primary care physician in regards to his blood pressure.  We will follow up with Mr. WBleierin 6 months.  All questions were answered. The patient knows to call the clinic with any problems, questions or concerns. We can certainly see the patient much sooner if necessary.  This document serves as a record of services personally performed by SAncil Linsey MD. It was created on her behalf by KToni Amend a trained medical scribe. The creation of this record is based on the scribe's personal observations and the provider's statements to them. This document has been checked and approved by the attending provider.  I have reviewed  the above documentation for accuracy and completeness, and I agree with the above.  This note is electronically signed by: PMolli Hazard MD  11/09/2014 5:53 PM

## 2014-11-09 NOTE — Patient Instructions (Signed)
..  [place] at [facility] Discharge Instructions  RECOMMENDATIONS MADE BY THE CONSULTANT AND ANY TEST RESULTS WILL BE SENT TO YOUR REFERRING PHYSICIAN.  Return in 6 months for MD visit and labs.    Thank you for choosing [place] at [facility] to provide your oncology and hematology care.  To afford each patient quality time with our provider, please arrive at least 15 minutes before your scheduled appointment time.    You need to re-schedule your appointment should you arrive 10 or more minutes late.  We strive to give you quality time with our providers, and arriving late affects you and other patients whose appointments are after yours.  Also, if you no show three or more times for appointments you may be dismissed from the clinic at the providers discretion.     Again, thank you for choosing [facility].  Our hope is that these requests will decrease the amount of time that you wait before being seen by our physicians.       _____________________________________________________________  Should you have questions after your visit to [facility], please contact our office at [phone] between the hours of 8:30 a.m. and 4:30 p.m.  Voicemails left after 4:30 p.m. will not be returned until the following business day.  For prescription refill requests, have your pharmacy contact our office.

## 2014-12-22 DIAGNOSIS — Z23 Encounter for immunization: Secondary | ICD-10-CM | POA: Diagnosis not present

## 2014-12-26 DIAGNOSIS — M25531 Pain in right wrist: Secondary | ICD-10-CM | POA: Diagnosis not present

## 2014-12-26 DIAGNOSIS — M79641 Pain in right hand: Secondary | ICD-10-CM | POA: Diagnosis not present

## 2014-12-26 DIAGNOSIS — Z79899 Other long term (current) drug therapy: Secondary | ICD-10-CM | POA: Diagnosis not present

## 2014-12-26 DIAGNOSIS — L408 Other psoriasis: Secondary | ICD-10-CM | POA: Diagnosis not present

## 2015-02-02 DIAGNOSIS — I219 Acute myocardial infarction, unspecified: Secondary | ICD-10-CM

## 2015-02-02 HISTORY — DX: Acute myocardial infarction, unspecified: I21.9

## 2015-02-21 DIAGNOSIS — J209 Acute bronchitis, unspecified: Secondary | ICD-10-CM | POA: Diagnosis not present

## 2015-02-22 ENCOUNTER — Encounter (HOSPITAL_COMMUNITY): Payer: Self-pay | Admitting: *Deleted

## 2015-02-22 ENCOUNTER — Emergency Department (HOSPITAL_COMMUNITY): Payer: BLUE CROSS/BLUE SHIELD

## 2015-02-22 ENCOUNTER — Encounter (HOSPITAL_COMMUNITY)
Admission: EM | Disposition: A | Discharge status: Home / Self Care | Payer: BLUE CROSS/BLUE SHIELD | Source: Home / Self Care | Attending: Cardiovascular Disease

## 2015-02-22 ENCOUNTER — Inpatient Hospital Stay (HOSPITAL_COMMUNITY)
Admission: EM | Admit: 2015-02-22 | Discharge: 2015-02-25 | DRG: 247 | Disposition: A | Discharge status: Home / Self Care | Payer: BLUE CROSS/BLUE SHIELD | Attending: Cardiovascular Disease | Admitting: Cardiovascular Disease

## 2015-02-22 DIAGNOSIS — I10 Essential (primary) hypertension: Secondary | ICD-10-CM | POA: Diagnosis present

## 2015-02-22 DIAGNOSIS — Z794 Long term (current) use of insulin: Secondary | ICD-10-CM | POA: Diagnosis not present

## 2015-02-22 DIAGNOSIS — E785 Hyperlipidemia, unspecified: Secondary | ICD-10-CM | POA: Diagnosis present

## 2015-02-22 DIAGNOSIS — J8410 Pulmonary fibrosis, unspecified: Secondary | ICD-10-CM | POA: Diagnosis not present

## 2015-02-22 DIAGNOSIS — Z87891 Personal history of nicotine dependence: Secondary | ICD-10-CM | POA: Diagnosis not present

## 2015-02-22 DIAGNOSIS — I447 Left bundle-branch block, unspecified: Secondary | ICD-10-CM | POA: Diagnosis present

## 2015-02-22 DIAGNOSIS — Z955 Presence of coronary angioplasty implant and graft: Secondary | ICD-10-CM

## 2015-02-22 DIAGNOSIS — D72829 Elevated white blood cell count, unspecified: Secondary | ICD-10-CM | POA: Diagnosis not present

## 2015-02-22 DIAGNOSIS — I2119 ST elevation (STEMI) myocardial infarction involving other coronary artery of inferior wall: Secondary | ICD-10-CM | POA: Diagnosis not present

## 2015-02-22 DIAGNOSIS — I4892 Unspecified atrial flutter: Secondary | ICD-10-CM | POA: Diagnosis not present

## 2015-02-22 DIAGNOSIS — I319 Disease of pericardium, unspecified: Secondary | ICD-10-CM | POA: Diagnosis not present

## 2015-02-22 DIAGNOSIS — Z79899 Other long term (current) drug therapy: Secondary | ICD-10-CM | POA: Diagnosis not present

## 2015-02-22 DIAGNOSIS — K219 Gastro-esophageal reflux disease without esophagitis: Secondary | ICD-10-CM | POA: Diagnosis present

## 2015-02-22 DIAGNOSIS — I213 ST elevation (STEMI) myocardial infarction of unspecified site: Secondary | ICD-10-CM

## 2015-02-22 DIAGNOSIS — Z7952 Long term (current) use of systemic steroids: Secondary | ICD-10-CM

## 2015-02-22 DIAGNOSIS — I2510 Atherosclerotic heart disease of native coronary artery without angina pectoris: Secondary | ICD-10-CM | POA: Diagnosis not present

## 2015-02-22 DIAGNOSIS — E876 Hypokalemia: Secondary | ICD-10-CM | POA: Diagnosis present

## 2015-02-22 DIAGNOSIS — I2109 ST elevation (STEMI) myocardial infarction involving other coronary artery of anterior wall: Secondary | ICD-10-CM | POA: Diagnosis present

## 2015-02-22 DIAGNOSIS — I480 Paroxysmal atrial fibrillation: Secondary | ICD-10-CM | POA: Diagnosis not present

## 2015-02-22 DIAGNOSIS — L4050 Arthropathic psoriasis, unspecified: Secondary | ICD-10-CM | POA: Diagnosis present

## 2015-02-22 DIAGNOSIS — I313 Pericardial effusion (noninflammatory): Secondary | ICD-10-CM | POA: Diagnosis not present

## 2015-02-22 DIAGNOSIS — I2584 Coronary atherosclerosis due to calcified coronary lesion: Secondary | ICD-10-CM | POA: Diagnosis present

## 2015-02-22 DIAGNOSIS — R7989 Other specified abnormal findings of blood chemistry: Secondary | ICD-10-CM

## 2015-02-22 DIAGNOSIS — I2121 ST elevation (STEMI) myocardial infarction involving left circumflex coronary artery: Secondary | ICD-10-CM | POA: Insufficient documentation

## 2015-02-22 DIAGNOSIS — M069 Rheumatoid arthritis, unspecified: Secondary | ICD-10-CM | POA: Diagnosis present

## 2015-02-22 DIAGNOSIS — I3139 Other pericardial effusion (noninflammatory): Secondary | ICD-10-CM

## 2015-02-22 DIAGNOSIS — R778 Other specified abnormalities of plasma proteins: Secondary | ICD-10-CM

## 2015-02-22 HISTORY — PX: CARDIAC CATHETERIZATION: SHX172

## 2015-02-22 LAB — COMPREHENSIVE METABOLIC PANEL
ALT: 20 U/L (ref 17–63)
ANION GAP: 12 (ref 5–15)
AST: 30 U/L (ref 15–41)
Albumin: 2.3 g/dL — ABNORMAL LOW (ref 3.5–5.0)
Alkaline Phosphatase: 73 U/L (ref 38–126)
BUN: 19 mg/dL (ref 6–20)
CHLORIDE: 95 mmol/L — AB (ref 101–111)
CO2: 24 mmol/L (ref 22–32)
CREATININE: 1.27 mg/dL — AB (ref 0.61–1.24)
Calcium: 8.8 mg/dL — ABNORMAL LOW (ref 8.9–10.3)
GFR calc non Af Amer: 59 mL/min — ABNORMAL LOW (ref >60)
Glucose, Bld: 158 mg/dL — ABNORMAL HIGH (ref 65–99)
Potassium: 4 mmol/L (ref 3.5–5.1)
Sodium: 131 mmol/L — ABNORMAL LOW (ref 135–145)
Total Bilirubin: 0.7 mg/dL (ref 0.3–1.2)
Total Protein: 7.1 g/dL (ref 6.5–8.1)

## 2015-02-22 LAB — I-STAT TROPONIN, ED: Troponin i, poc: 0.26 ng/mL (ref 0.00–0.08)

## 2015-02-22 LAB — I-STAT CHEM 8, ED
BUN: 22 mg/dL — ABNORMAL HIGH (ref 6–20)
Calcium, Ion: 0.92 mmol/L — ABNORMAL LOW (ref 1.13–1.30)
Chloride: 94 mmol/L — ABNORMAL LOW (ref 101–111)
Creatinine, Ser: 1.2 mg/dL (ref 0.61–1.24)
Glucose, Bld: 153 mg/dL — ABNORMAL HIGH (ref 65–99)
HEMATOCRIT: 42 % (ref 39.0–52.0)
HEMOGLOBIN: 14.3 g/dL (ref 13.0–17.0)
POTASSIUM: 3.8 mmol/L (ref 3.5–5.1)
SODIUM: 132 mmol/L — AB (ref 135–145)
TCO2: 25 mmol/L (ref 0–100)

## 2015-02-22 LAB — CBC
HCT: 38 % — ABNORMAL LOW (ref 39.0–52.0)
Hemoglobin: 12.5 g/dL — ABNORMAL LOW (ref 13.0–17.0)
MCH: 25.8 pg — ABNORMAL LOW (ref 26.0–34.0)
MCHC: 32.9 g/dL (ref 30.0–36.0)
MCV: 78.4 fL (ref 78.0–100.0)
PLATELETS: 481 10*3/uL — AB (ref 150–400)
RBC: 4.85 MIL/uL (ref 4.22–5.81)
RDW: 14.7 % (ref 11.5–15.5)
WBC: 23.6 10*3/uL — ABNORMAL HIGH (ref 4.0–10.5)

## 2015-02-22 LAB — HEPATIC FUNCTION PANEL
ALBUMIN: 2 g/dL — AB (ref 3.5–5.0)
ALK PHOS: 61 U/L (ref 38–126)
ALT: 18 U/L (ref 17–63)
AST: 28 U/L (ref 15–41)
Bilirubin, Direct: 0.2 mg/dL (ref 0.1–0.5)
Indirect Bilirubin: 0.5 mg/dL (ref 0.3–0.9)
TOTAL PROTEIN: 6.2 g/dL — AB (ref 6.5–8.1)
Total Bilirubin: 0.7 mg/dL (ref 0.3–1.2)

## 2015-02-22 LAB — APTT: aPTT: 30 seconds (ref 24–37)

## 2015-02-22 LAB — TROPONIN I: Troponin I: 0.56 ng/mL (ref <0.031)

## 2015-02-22 LAB — LIPID PANEL
Cholesterol: 80 mg/dL (ref 0–200)
HDL: 20 mg/dL — ABNORMAL LOW (ref >40)
LDL CALC: 48 mg/dL (ref 0–99)
Total CHOL/HDL Ratio: 4 RATIO
Triglycerides: 61 mg/dL (ref <150)
VLDL: 12 mg/dL (ref 0–40)

## 2015-02-22 LAB — PROTIME-INR
INR: 1.27 (ref 0.00–1.49)
Prothrombin Time: 16.1 seconds — ABNORMAL HIGH (ref 11.6–15.2)

## 2015-02-22 LAB — BRAIN NATRIURETIC PEPTIDE: B NATRIURETIC PEPTIDE 5: 275.6 pg/mL — AB (ref 0.0–100.0)

## 2015-02-22 LAB — MRSA PCR SCREENING: MRSA BY PCR: NEGATIVE

## 2015-02-22 LAB — POCT ACTIVATED CLOTTING TIME: Activated Clotting Time: 554 seconds

## 2015-02-22 SURGERY — LEFT HEART CATH AND CORONARY ANGIOGRAPHY
Anesthesia: LOCAL

## 2015-02-22 MED ORDER — PANTOPRAZOLE SODIUM 40 MG PO TBEC
40.0000 mg | DELAYED_RELEASE_TABLET | Freq: Every day | ORAL | Status: DC
  Administered 2015-02-23 – 2015-02-25 (×3): 40 mg via ORAL
  Filled 2015-02-22 (×4): qty 1

## 2015-02-22 MED ORDER — HEPARIN (PORCINE) IN NACL 2-0.9 UNIT/ML-% IJ SOLN
INTRAMUSCULAR | Status: AC
  Filled 2015-02-22: qty 500

## 2015-02-22 MED ORDER — SODIUM CHLORIDE 0.9 % IV SOLN
INTRAVENOUS | Status: DC | PRN
  Administered 2015-02-22: 82 mL/h
  Administered 2015-02-22: 82 mL via INTRAVENOUS

## 2015-02-22 MED ORDER — NITROGLYCERIN 1 MG/10 ML FOR IR/CATH LAB
INTRA_ARTERIAL | Status: AC
  Filled 2015-02-22: qty 10

## 2015-02-22 MED ORDER — LISINOPRIL-HYDROCHLOROTHIAZIDE 20-25 MG PO TABS: 1.0000 | ORAL_TABLET | Freq: Every day | ORAL | Status: DC

## 2015-02-22 MED ORDER — SODIUM CHLORIDE 0.9 % IJ SOLN: 3.0000 mL | INTRAMUSCULAR | Status: DC | PRN

## 2015-02-22 MED ORDER — BIVALIRUDIN BOLUS VIA INFUSION - CUPID
INTRAVENOUS | Status: DC | PRN
  Administered 2015-02-22: 62.25 mg via INTRAVENOUS

## 2015-02-22 MED ORDER — BIVALIRUDIN 250 MG IV SOLR
INTRAVENOUS | Status: AC
  Filled 2015-02-22: qty 250

## 2015-02-22 MED ORDER — NITROGLYCERIN 1 MG/10 ML FOR IR/CATH LAB
INTRA_ARTERIAL | Status: DC | PRN
  Administered 2015-02-22: 18:00:00

## 2015-02-22 MED ORDER — ASPIRIN 81 MG PO CHEW
81.0000 mg | CHEWABLE_TABLET | Freq: Every day | ORAL | Status: DC
  Administered 2015-02-23 – 2015-02-24 (×2): 81 mg via ORAL
  Filled 2015-02-22 (×3): qty 1

## 2015-02-22 MED ORDER — SODIUM CHLORIDE 0.9 % IV SOLN
1.7500 mg/kg/h | INTRAVENOUS | Status: AC
  Administered 2015-02-22 (×2): 1.75 mg/kg/h via INTRAVENOUS
  Filled 2015-02-22 (×2): qty 250

## 2015-02-22 MED ORDER — SODIUM CHLORIDE 0.9 % IV SOLN
250.0000 mg | INTRAVENOUS | Status: DC | PRN
  Administered 2015-02-22 (×2): 1.75 mg/kg/h via INTRAVENOUS

## 2015-02-22 MED ORDER — LISINOPRIL 10 MG PO TABS
20.0000 mg | ORAL_TABLET | Freq: Every day | ORAL | Status: DC
  Administered 2015-02-23 – 2015-02-24 (×2): 20 mg via ORAL
  Administered 2015-02-25: 10 mg via ORAL
  Filled 2015-02-22: qty 1
  Filled 2015-02-22: qty 2
  Filled 2015-02-22: qty 1

## 2015-02-22 MED ORDER — ENSURE ENLIVE PO LIQD
237.0000 mL | Freq: Two times a day (BID) | ORAL | Status: DC
  Administered 2015-02-23 – 2015-02-24 (×3): 237 mL via ORAL

## 2015-02-22 MED ORDER — AMIODARONE HCL IN DEXTROSE 360-4.14 MG/200ML-% IV SOLN
60.0000 mg/h | INTRAVENOUS | Status: AC
  Administered 2015-02-22 – 2015-02-23 (×2): 60 mg/h via INTRAVENOUS
  Filled 2015-02-22 (×2): qty 200

## 2015-02-22 MED ORDER — ASPIRIN 81 MG PO CHEW
324.0000 mg | CHEWABLE_TABLET | Freq: Once | ORAL | Status: AC
  Administered 2015-02-22: 324 mg via ORAL
  Filled 2015-02-22: qty 4

## 2015-02-22 MED ORDER — HEPARIN SODIUM (PORCINE) 1000 UNIT/ML IJ SOLN
INTRAMUSCULAR | Status: AC
  Filled 2015-02-22: qty 1

## 2015-02-22 MED ORDER — HEPARIN SODIUM (PORCINE) 1000 UNIT/ML IJ SOLN
INTRAMUSCULAR | Status: DC | PRN
  Administered 2015-02-22: 3000 [IU] via INTRAVENOUS

## 2015-02-22 MED ORDER — TICAGRELOR 90 MG PO TABS: 90.0000 mg | ORAL_TABLET | Freq: Two times a day (BID) | ORAL | Status: DC

## 2015-02-22 MED ORDER — METOPROLOL TARTRATE 12.5 MG HALF TABLET
12.5000 mg | ORAL_TABLET | Freq: Two times a day (BID) | ORAL | Status: DC
  Administered 2015-02-22: 12.5 mg via ORAL
  Filled 2015-02-22: qty 1

## 2015-02-22 MED ORDER — SIMVASTATIN 20 MG PO TABS: 10.0000 mg | ORAL_TABLET | Freq: Every day | ORAL | Status: DC

## 2015-02-22 MED ORDER — PREDNISONE 5 MG PO TABS
5.0000 mg | ORAL_TABLET | Freq: Every morning | ORAL | Status: DC
  Administered 2015-02-23 – 2015-02-25 (×3): 5 mg via ORAL
  Filled 2015-02-22 (×3): qty 1

## 2015-02-22 MED ORDER — ATORVASTATIN CALCIUM 80 MG PO TABS
80.0000 mg | ORAL_TABLET | Freq: Every day | ORAL | Status: DC
  Administered 2015-02-23 – 2015-02-24 (×2): 80 mg via ORAL
  Filled 2015-02-22 (×2): qty 1

## 2015-02-22 MED ORDER — VERAPAMIL HCL 2.5 MG/ML IV SOLN
INTRAVENOUS | Status: AC
  Filled 2015-02-22: qty 2

## 2015-02-22 MED ORDER — HEPARIN (PORCINE) IN NACL 2-0.9 UNIT/ML-% IJ SOLN
INTRAMUSCULAR | Status: AC
  Filled 2015-02-22: qty 1000

## 2015-02-22 MED ORDER — ONDANSETRON HCL 4 MG/2ML IJ SOLN
4.0000 mg | Freq: Four times a day (QID) | INTRAMUSCULAR | Status: DC | PRN
  Administered 2015-02-23: 4 mg via INTRAVENOUS
  Filled 2015-02-22: qty 2

## 2015-02-22 MED ORDER — HYDROCHLOROTHIAZIDE 25 MG PO TABS
25.0000 mg | ORAL_TABLET | Freq: Every day | ORAL | Status: DC
  Administered 2015-02-23 – 2015-02-25 (×3): 25 mg via ORAL
  Filled 2015-02-22 (×3): qty 1

## 2015-02-22 MED ORDER — AMIODARONE HCL IN DEXTROSE 360-4.14 MG/200ML-% IV SOLN
30.0000 mg/h | INTRAVENOUS | Status: DC
  Administered 2015-02-23 – 2015-02-24 (×3): 30 mg/h via INTRAVENOUS
  Filled 2015-02-22 (×3): qty 200

## 2015-02-22 MED ORDER — TICAGRELOR 90 MG PO TABS
ORAL_TABLET | ORAL | Status: DC | PRN
  Administered 2015-02-22: 180 mg via ORAL

## 2015-02-22 MED ORDER — LIDOCAINE HCL (PF) 1 % IJ SOLN
INTRAMUSCULAR | Status: AC
  Filled 2015-02-22: qty 30

## 2015-02-22 MED ORDER — IOHEXOL 350 MG/ML SOLN
INTRAVENOUS | Status: DC | PRN
  Administered 2015-02-22: 100 mL via INTRAVENOUS
  Administered 2015-02-22: 50 mL via INTRAVENOUS
  Administered 2015-02-22: 100 mL via INTRAVENOUS

## 2015-02-22 MED ORDER — ACETAMINOPHEN 325 MG PO TABS: 650.0000 mg | ORAL_TABLET | ORAL | Status: DC | PRN

## 2015-02-22 MED ORDER — SODIUM CHLORIDE 0.9 % IV SOLN
INTRAVENOUS | Status: DC
  Administered 2015-02-22: 18:00:00 via INTRAVENOUS
  Administered 2015-02-22: 10 mL/h via INTRAVENOUS

## 2015-02-22 MED ORDER — TICAGRELOR 90 MG PO TABS
ORAL_TABLET | ORAL | Status: AC
  Filled 2015-02-22: qty 2

## 2015-02-22 MED ORDER — ATORVASTATIN CALCIUM 80 MG PO TABS: 80.0000 mg | ORAL_TABLET | Freq: Every day | ORAL | Status: DC

## 2015-02-22 MED ORDER — SODIUM CHLORIDE 0.45 % IV SOLN
INTRAVENOUS | Status: DC | PRN
  Administered 2015-02-22: 75 mL/h via INTRAVENOUS

## 2015-02-22 MED ORDER — VERAPAMIL HCL 2.5 MG/ML IV SOLN
INTRA_ARTERIAL | Status: DC | PRN
  Administered 2015-02-22: 10 mL via INTRA_ARTERIAL

## 2015-02-22 MED ORDER — SODIUM CHLORIDE 0.9 % IJ SOLN
3.0000 mL | Freq: Two times a day (BID) | INTRAMUSCULAR | Status: DC
  Administered 2015-02-22 – 2015-02-24 (×4): 3 mL via INTRAVENOUS

## 2015-02-22 MED ORDER — LIDOCAINE HCL (PF) 1 % IJ SOLN
INTRAMUSCULAR | Status: DC | PRN
  Administered 2015-02-22: 2 mL

## 2015-02-22 MED ORDER — SODIUM CHLORIDE 0.9 % IV SOLN: 250.0000 mL | INTRAVENOUS | Status: DC | PRN

## 2015-02-22 MED ORDER — SODIUM CHLORIDE 0.9 % WEIGHT BASED INFUSION
3.0000 mL/kg/h | INTRAVENOUS | Status: AC
  Administered 2015-02-22: 3 mL/kg/h via INTRAVENOUS

## 2015-02-22 MED ORDER — HEPARIN SODIUM (PORCINE) 5000 UNIT/ML IJ SOLN
INTRAMUSCULAR | Status: AC
  Administered 2015-02-22: 4000 [IU] via INTRAVENOUS
  Filled 2015-02-22: qty 1

## 2015-02-22 MED ORDER — HEPARIN SODIUM (PORCINE) 5000 UNIT/ML IJ SOLN
4000.0000 [IU] | INTRAMUSCULAR | Status: AC
  Administered 2015-02-22: 4000 [IU] via INTRAVENOUS

## 2015-02-22 MED ORDER — MORPHINE SULFATE (PF) 2 MG/ML IV SOLN: 2.0000 mg | INTRAVENOUS | Status: DC | PRN

## 2015-02-22 MED ORDER — TICAGRELOR 90 MG PO TABS
90.0000 mg | ORAL_TABLET | Freq: Two times a day (BID) | ORAL | Status: DC
  Administered 2015-02-23 – 2015-02-25 (×5): 90 mg via ORAL
  Filled 2015-02-22 (×5): qty 1

## 2015-02-22 MED ORDER — AMIODARONE LOAD VIA INFUSION
150.0000 mg | Freq: Once | INTRAVENOUS | Status: AC
  Administered 2015-02-22: 150 mg via INTRAVENOUS
  Filled 2015-02-22: qty 83.34

## 2015-02-22 SURGICAL SUPPLY — 23 items
BALLN ANGIOSCULPT RX 2.0X10 (BALLOONS) ×2
BALLN EUPHORA RX 2.0X12 (BALLOONS) ×2
BALLN ~~LOC~~ TREK RX 2.5X8 (BALLOONS) ×2
BALLOON ANGIOSCULPT RX 2.0X10 (BALLOONS) IMPLANT
BALLOON EUPHORA RX 2.0X12 (BALLOONS) IMPLANT
BALLOON ~~LOC~~ TREK RX 2.5X8 (BALLOONS) IMPLANT
CATH OPTITORQUE TIG 4.0 5F (CATHETERS) ×2 IMPLANT
CATH VISTA GUIDE 6FR XB3.5 (CATHETERS) ×1 IMPLANT
DEVICE RAD COMP TR BAND LRG (VASCULAR PRODUCTS) ×2 IMPLANT
GLIDESHEATH SLEND A-KIT 6F 22G (SHEATH) ×2 IMPLANT
KIT ENCORE 26 ADVANTAGE (KITS) ×1 IMPLANT
KIT HEART LEFT (KITS) ×2 IMPLANT
PACK CARDIAC CATHETERIZATION (CUSTOM PROCEDURE TRAY) ×2 IMPLANT
SHEATH PINNACLE 6F 10CM (SHEATH) ×1 IMPLANT
STENT SYNERGY DES 2.25X12 (Permanent Stent) ×1 IMPLANT
STENT SYNERGY DES 2.25X8 (Permanent Stent) ×2 IMPLANT
SYR MEDRAD MARK V 150ML (SYRINGE) ×2 IMPLANT
TRANSDUCER W/STOPCOCK (MISCELLANEOUS) ×2 IMPLANT
TUBING CIL FLEX 10 FLL-RA (TUBING) ×2 IMPLANT
WIRE ASAHI FIELDER XT 190CM (WIRE) ×1 IMPLANT
WIRE ASAHI PROWATER 180CM (WIRE) ×2 IMPLANT
WIRE HI TORQ VERSACORE-J 145CM (WIRE) ×2 IMPLANT
WIRE SAFE-T 1.5MM-J .035X260CM (WIRE) ×2 IMPLANT

## 2015-02-22 NOTE — ED Notes (Signed)
Cardiology at bedside.

## 2015-02-22 NOTE — ED Provider Notes (Signed)
CSN: [id number]     Arrival date & time 02/22/15  1720 History   First MD Initiated Contact with Patient 02/22/15 1735     Chief Complaint  Patient presents with  . Chest Pain  . Cough  . Near Syncope     (Consider location/radiation/quality/duration/timing/severity/associated sxs/prior Treatment) Patient is a 59 y.o. male presenting with chest pain. The history is provided by the patient.  Chest Pain Pain location:  Substernal area Pain quality: aching and pressure   Pain radiates to:  Does not radiate Pain radiates to the back: no   Pain severity:  Moderate Onset quality:  Gradual Duration:  2 days Timing:  Intermittent Progression:  Waxing and waning Chronicity:  New Context: at rest   Relieved by:  Nothing Worsened by:  Nothing tried Ineffective treatments:  None tried Associated symptoms: cough (prior to symptoms over 2 weeks )   Risk factors: high cholesterol and hypertension     Past Medical History  Diagnosis Date  . Psoriasis 2003  . Rheumatoid arthritis(714.0) 2012  . Psoriatic arthritis ([place])   . Leukocytosis   . Lymphadenopathy   . Pulmonary fibrosis ([place])   . Hypertension   . GERD (gastroesophageal reflux disease)    History reviewed. No pertinent past surgical history. History reviewed. No pertinent family history. Social History  Substance Use Topics  . Smoking status: Former Smoker -- 1.00 packs/day for 30 years    Types: Cigarettes    Quit date: 03/21/2003  . Smokeless tobacco: Never Used  . Alcohol Use: No     Comment: H/O case of beer weekly x 20 years, quiting in 2000-ish.    Review of Systems  Respiratory: Positive for cough (prior to symptoms over 2 weeks ).   Cardiovascular: Positive for chest pain.  All other systems reviewed and are negative.     Allergies  Review of patient's allergies indicates no known allergies.  Home Medications   Prior to Admission medications   Medication Sig Start Date End Date Taking? Authorizing  Provider  esomeprazole (NEXIUM) 40 MG capsule Take 40 mg by mouth daily at 12 noon.    Historical Provider, MD  HUMIRA PEN 40 MG/0.8ML PNKT  09/28/14   Historical Provider, MD  leflunomide (ARAVA) 20 MG tablet  10/04/14   Historical Provider, MD  lisinopril-hydrochlorothiazide (PRINZIDE,ZESTORETIC) 20-25 MG per tablet Take 1 tablet by mouth daily.    Historical Provider, MD  OTEZLA 30 MG TABS  10/03/14   Historical Provider, MD  predniSONE (DELTASONE) 5 MG tablet Take 5 mg by mouth every morning. 10/24/14   Historical Provider, MD  simvastatin (ZOCOR) 10 MG tablet Take 10 mg by mouth at bedtime.  07/23/11   Historical Provider, MD   BP 101/59 mmHg  Pulse 112  Temp(Src) 99.8 F (37.7 C) (Oral)  Resp 20  Wt 182 lb 15.7 oz (83 kg)  SpO2 99% Physical Exam  Constitutional: He is oriented to person, place, and time. He appears well-developed and well-nourished. No distress.  HENT:  Head: Normocephalic and atraumatic.  Eyes: Conjunctivae are normal.  Neck: Neck supple. No tracheal deviation present.  Cardiovascular: Normal rate, regular rhythm and normal heart sounds.   Pulmonary/Chest: Effort normal and breath sounds normal. No respiratory distress.  Abdominal: Soft. He exhibits no distension. There is no tenderness.  Neurological: He is alert and oriented to person, place, and time.  Skin: Skin is warm and dry.  Psychiatric: He has a normal mood and affect.    ED Course  Procedures (including critical care time)  CRITICAL CARE Performed by: Leo Grosser Total critical care time: 30 minutes Critical care time was exclusive of separately billable procedures and treating other patients. Critical care was necessary to treat or prevent imminent or life-threatening deterioration. Critical care was time spent personally by me on the following activities: development of treatment plan with patient and/or surrogate as well as nursing, discussions with consultants, evaluation of patient's response  to treatment, examination of patient, obtaining history from patient or surrogate, ordering and performing treatments and interventions, ordering and review of laboratory studies, ordering and review of radiographic studies, pulse oximetry and re-evaluation of patient's condition.  Emergency Focused Ultrasound Exam Limited Ultrasound of the Heart and Pericardium  Performed and interpreted by Dr. Laneta Simmers Indication: chest pain Multiple views of the heart, pericardium, and IVC are obtained with a multi frequency probe.  Findings: enlarged cardiac size, reduced contractility, + anechoic fluid, minimal IVC collapse, no  Interpretation: minimally depressed ejection fraction, no RV dilatation, + small pericardial effusion, no depressed CVP, no tamponade Images archived electronically.  CPT Code: [id number]   [place], ED - Abnormal; Notable for the following:    Troponin i, poc 0.26 (*)    All other components within normal limits  I-STAT CHEM 8, ED - Abnormal; Notable for the following:    Sodium 132 (*)    Chloride 94 (*)    BUN 22 (*)    Glucose, Bld 153 (*)    Calcium, Ion 0.92 (*)    All other components within normal limits  APTT  CBC  COMPREHENSIVE METABOLIC PANEL  PROTIME-INR  BRAIN NATRIURETIC PEPTIDE    Imaging Review Dg Chest Port 1 View  02/22/2015  CLINICAL DATA:  Pre heart catheterization. History of hypertension, pulmonary fibrosis. EXAM: PORTABLE CHEST 1 VIEW COMPARISON:  11/01/2013 FINDINGS: Cardiomegaly. Low lung volumes. Interstitial prominence, most pronounced in the lung bases which likely reflects a combination of fibrosis and atelectasis given the low volumes. No visible significant effusion. IMPRESSION: Interstitial prominence, most likely interstitial lung disease/ fibrosis with superimposed bibasilar atelectasis. Low lung volumes. Electronically Signed   By: Rolm Baptise M.D.   On: 02/22/2015 18:01   I have personally reviewed and  evaluated these images and lab results as part of my medical decision-making.   EKG Interpretation   Date/Time:  Wednesday February 22 2015 17:22:29 EST Ventricular Rate:  113 PR Interval:  150 QRS Duration: 130 QT Interval:  362 QTC Calculation: 496 R Axis:   8 Text Interpretation:  Sinus tachycardia Left bundle branch block lateral  concordant ST elevation >94mm discordant ST elevation >60mm anteriorly  Abnormal ECG ** ** ACUTE MI / STEMI ** ** Confirmed by Corena Tilson MD, Eulalah Rupert  [id number]) on 02/22/2015 5:35:47 PM      MDM   Final diagnoses:  ST elevation myocardial infarction (STEMI), unspecified artery (HCC)  Pericardial effusion  Elevated troponin level    59 y.o. male presents with chest pain intermittently with cough and fatigue over the last 2 weeks. Pain has been intermittently worsening over the last 2 days. Patient has tachycardia and low-grade fever here. Multiple ST segment findings with left bundle branch block consistent with scgarbossa criteria and potential ST elevation MI. Code STEMI activated by myself in triage prior to evaluating Pt. Dr. [place] available at bedside and agrees with emergent cardiac catheterization evaluation to evaluate ACS with EKG findings and +trop. Symptoms are positional, following upper respiratory symptoms, and small effusion noted  on ultrasound which could clinically be consistent with acute pericarditis. This may explain the patient's EKG findings but with elevated troponin and no previous tracings available, agree with transportation to Cath Lab for emergent workup.   Leo Grosser, MD 02/23/15 [phone]

## 2015-02-22 NOTE — ED Notes (Addendum)
Pt reports having productive cough, fatigue, cp and near syncope. Went to pcp and diagnosed with bronchitis. Pt reports episode of using restroom had cp, near syncope, diaphoresis. Today has sharp mid chest pains that increase with movement and breathing. Reports fever and fatigue, appears pale. Pt hypotensive at triage.

## 2015-02-22 NOTE — ED Notes (Signed)
Patient transported to cath lab

## 2015-02-22 NOTE — Progress Notes (Signed)
Chaplain responded to STEMI page. Upon reporting Chaplain spoke with nurse. Not sure it is a STEMI and wife is by the bedside. Ask nurse to call if Chaplain services are needed   02/22/15 1734  Clinical Encounter Type  Visited With Health care provider  Visit Type ED  Referral From Care management  Spiritual Encounters  Spiritual Needs Emotional  Advance Directives (For Healthcare)  Does patient have an advance directive? No

## 2015-02-22 NOTE — ED Notes (Signed)
Patient's wife at bedside given clothes shoes and wallet in patient belongings bag.

## 2015-02-22 NOTE — ED Notes (Signed)
Activated Code Stemi  

## 2015-02-22 NOTE — H&P (Signed)
Patient ID: Curtis Flores MRN: [id number], DOB/AGE: 1955/12/15   Admit date: 02/22/2015   Primary Physician: [name], MD Primary Cardiologist: New  Pt. Profile:  59 y/o male with h/o HTN, HLD and RA but no prior cardiac history admitted for STEMI.   Problem List  Past Medical History  Diagnosis Date  . Psoriasis 2003  . Rheumatoid arthritis(714.0) 2012  . Psoriatic arthritis ([place])   . Leukocytosis   . Lymphadenopathy   . Pulmonary fibrosis ([place])   . Hypertension   . GERD (gastroesophageal reflux disease)     History reviewed. No pertinent past surgical history.   Allergies  No Known Allergies  HPI  59 y/o male with h/o RA, HTN and HLD but no prior cardiac history presenting with CP and ST elevations/LBBB. Patient complained of SSCP off and on for the last 3 days. CODE STEMI activated and patient taken directly to the [facility] cath lab for emergent revascularization. Found to have an occluded LCx and OM.   Home Medications  Prior to Admission medications   Medication Sig Start Date End Date Taking? Authorizing Provider  esomeprazole (NEXIUM) 40 MG capsule Take 40 mg by mouth daily at 12 noon.    Historical Provider, MD  HUMIRA PEN 40 MG/0.8ML PNKT  09/28/14   Historical Provider, MD  leflunomide (ARAVA) 20 MG tablet  10/04/14   Historical Provider, MD  lisinopril-hydrochlorothiazide (PRINZIDE,ZESTORETIC) 20-25 MG per tablet Take 1 tablet by mouth daily.    Historical Provider, MD  OTEZLA 30 MG TABS  10/03/14   Historical Provider, MD  predniSONE (DELTASONE) 5 MG tablet Take 5 mg by mouth every morning. 10/24/14   Historical Provider, MD  simvastatin (ZOCOR) 10 MG tablet Take 10 mg by mouth at bedtime.  07/23/11   Historical Provider, MD    Family History  Family History  Problem Relation Age of Onset  . Hypertension Mother     Social History  Social History   Social History  . Marital Status: Married    Spouse Name: N/A  . Number of Children: 2  .  Years of Education: N/A   Occupational History  . unemployed     not working do to arthritis; used to be Patent attorney for a [place] History Main Topics  . Smoking status: Former Smoker -- 1.00 packs/day for 30 years    Types: Cigarettes    Quit date: 03/21/2003  . Smokeless tobacco: Never Used  . Alcohol Use: No     Comment: H/O case of beer weekly x 20 years, quiting in 2000-ish.  . Drug Use: No     Comment: H/O marijuana use many years ago.  [name] Sexual Activity: Yes    Birth Control/ Protection: None   Other Topics Concern  . Not on file   Social History Narrative     Review of Systems General:  No chills, fever, night sweats or weight changes.  Cardiovascular:  + chest pain, dyspnea on exertion, no edema, orthopnea, palpitations, paroxysmal nocturnal dyspnea. Dermatological: No rash, lesions/masses Respiratory: No cough, dyspnea Urologic: No hematuria, dysuria Abdominal:   No nausea, vomiting, diarrhea, bright red blood per rectum, melena, or hematemesis Neurologic:  No visual changes, wkns, changes in mental status. All other systems reviewed and are otherwise negative except as noted above.  Physical Exam  Blood pressure 109/48, pulse 110, temperature 99.8 F (37.7 C), temperature source Oral, resp. rate 20, weight 182 lb 15.7 oz (83 kg), SpO2  99 %.  General: Pleasant, NAD Psych: Normal affect. Neuro: Alert and oriented X 3. Moves all extremities spontaneously. HEENT: Normal  Neck: Supple without bruits or JVD. Lungs:  Resp regular and unlabored, CTA. Heart: RRR no s3, s4, or murmurs. Abdomen: Soft, non-tender, non-distended, BS + x 4.  Extremities: No clubbing, cyanosis or edema. DP/PT/Radials 2+ and equal bilaterally.  Labs  Troponin [facility] of Care Test)  Recent Labs  02/22/15 1736  TROPIPOC 0.26*   No results for input(s): CKTOTAL, CKMB, TROPONINI in the last 72 hours. Lab Results  Component Value Date   WBC 23.6* 02/22/2015    HGB 14.3 02/22/2015   HCT 42.0 02/22/2015   MCV 78.4 02/22/2015   PLT 481* 02/22/2015     Recent Labs Lab 02/22/15 1734 02/22/15 1738  NA 131* 132*  K 4.0 3.8  CL 95* 94*  CO2 24  --   BUN 19 22*  CREATININE 1.27* 1.20  CALCIUM 8.8*  --   PROT 7.1  --   BILITOT 0.7  --   ALKPHOS 73  --   ALT 20  --   AST 30  --   GLUCOSE 158* 153*   No results found for: CHOL, HDL, LDLCALC, TRIG No results found for: DDIMER   Radiology/Studies  Dg Chest Port 1 View  02/22/2015  CLINICAL DATA:  Pre heart catheterization. History of hypertension, pulmonary fibrosis. EXAM: PORTABLE CHEST 1 VIEW COMPARISON:  11/01/2013 FINDINGS: Cardiomegaly. Low lung volumes. Interstitial prominence, most pronounced in the lung bases which likely reflects a combination of fibrosis and atelectasis given the low volumes. No visible significant effusion. IMPRESSION: Interstitial prominence, most likely interstitial lung disease/ fibrosis with superimposed bibasilar atelectasis. Low lung volumes. Electronically Signed   By: Rolm Baptise M.D.   On: 02/22/2015 18:01    ECG  LBBB. Lateral ST segment elevations    ASSESSMENT AND PLAN  Active Problems:   ST elevation (STEMI) myocardial infarction ([place])  1. STEMI: emergent LHC.  Found to have severe LCx and OM disease. S/p PCI + DES. LVF ok. Will admit to the CCU. Will cycle ezymes. DAPT with ASA + Brilinta.  Will initiate BB therapy with metoprolol. High dose statin therapy with Lipitor. Can add ACE-I prior to discharge if BP and renal function allows. 2D echo in the am. Fasting lipid panel in the am with HFTs. Cardiac rehab prior to discharge.    Signed, Lyda Jester, PA-C 02/22/2015, 7:10 PM

## 2015-02-22 NOTE — Progress Notes (Signed)
Post-cardiac catheterization EKG is A-fib with RVR, paged MD and new orders received. Vicie Mutters, RN

## 2015-02-23 ENCOUNTER — Encounter (HOSPITAL_COMMUNITY): Payer: Self-pay | Admitting: Cardiovascular Disease

## 2015-02-23 ENCOUNTER — Ambulatory Visit (HOSPITAL_COMMUNITY): Payer: BLUE CROSS/BLUE SHIELD

## 2015-02-23 DIAGNOSIS — I213 ST elevation (STEMI) myocardial infarction of unspecified site: Secondary | ICD-10-CM

## 2015-02-23 DIAGNOSIS — I480 Paroxysmal atrial fibrillation: Secondary | ICD-10-CM

## 2015-02-23 LAB — BASIC METABOLIC PANEL
ANION GAP: 11 (ref 5–15)
BUN: 10 mg/dL (ref 6–20)
CALCIUM: 8.2 mg/dL — AB (ref 8.9–10.3)
CO2: 24 mmol/L (ref 22–32)
CREATININE: 0.66 mg/dL (ref 0.61–1.24)
Chloride: 97 mmol/L — ABNORMAL LOW (ref 101–111)
GFR calc Af Amer: >60 mL/min (ref >60)
GFR calc non Af Amer: >60 mL/min (ref >60)
GLUCOSE: 144 mg/dL — AB (ref 65–99)
Potassium: 2.9 mmol/L — ABNORMAL LOW (ref 3.5–5.1)
Sodium: 132 mmol/L — ABNORMAL LOW (ref 135–145)

## 2015-02-23 LAB — TROPONIN I
TROPONIN I: 0.63 ng/mL — AB (ref <0.031)
Troponin I: 0.54 ng/mL (ref <0.031)

## 2015-02-23 LAB — CBC
HCT: 35.7 % — ABNORMAL LOW (ref 39.0–52.0)
HEMOGLOBIN: 11.8 g/dL — AB (ref 13.0–17.0)
MCH: 25.5 pg — AB (ref 26.0–34.0)
MCHC: 33.1 g/dL (ref 30.0–36.0)
MCV: 77.3 fL — AB (ref 78.0–100.0)
Platelets: 406 10*3/uL — ABNORMAL HIGH (ref 150–400)
RBC: 4.62 MIL/uL (ref 4.22–5.81)
RDW: 15 % (ref 11.5–15.5)
WBC: 23 10*3/uL — ABNORMAL HIGH (ref 4.0–10.5)

## 2015-02-23 LAB — HEPARIN LEVEL (UNFRACTIONATED): Heparin Unfractionated: <0.1 IU/mL — ABNORMAL LOW (ref 0.30–0.70)

## 2015-02-23 MED ORDER — SODIUM CHLORIDE 0.9 % IJ SOLN
3.0000 mL | Freq: Two times a day (BID) | INTRAMUSCULAR | Status: DC
  Administered 2015-02-24: 3 mL via INTRAVENOUS

## 2015-02-23 MED ORDER — SODIUM CHLORIDE 0.9 % IV SOLN: 250.0000 mL | INTRAVENOUS | Status: DC | PRN

## 2015-02-23 MED ORDER — SODIUM CHLORIDE 0.9 % WEIGHT BASED INFUSION: 1.0000 mL/kg/h | INTRAVENOUS | Status: DC

## 2015-02-23 MED ORDER — METOPROLOL TARTRATE 25 MG PO TABS
25.0000 mg | ORAL_TABLET | Freq: Four times a day (QID) | ORAL | Status: DC
  Administered 2015-02-23 – 2015-02-24 (×8): 25 mg via ORAL
  Filled 2015-02-23 (×8): qty 1

## 2015-02-23 MED ORDER — POTASSIUM CHLORIDE CRYS ER 20 MEQ PO TBCR
40.0000 meq | EXTENDED_RELEASE_TABLET | Freq: Two times a day (BID) | ORAL | Status: AC
  Administered 2015-02-23 – 2015-02-24 (×4): 40 meq via ORAL
  Filled 2015-02-23 (×4): qty 2

## 2015-02-23 MED ORDER — ASPIRIN 81 MG PO CHEW
81.0000 mg | CHEWABLE_TABLET | ORAL | Status: AC
  Administered 2015-02-24: 81 mg via ORAL
  Filled 2015-02-23: qty 1

## 2015-02-23 MED ORDER — HEPARIN (PORCINE) IN NACL 100-0.45 UNIT/ML-% IJ SOLN
1900.0000 [IU]/h | INTRAMUSCULAR | Status: DC
  Administered 2015-02-23: 1200 [IU]/h via INTRAVENOUS
  Administered 2015-02-24: 1900 [IU]/h via INTRAVENOUS
  Filled 2015-02-23 (×2): qty 250

## 2015-02-23 MED ORDER — METOPROLOL TARTRATE 1 MG/ML IV SOLN: 5.0000 mg | INTRAVENOUS | Status: DC | PRN

## 2015-02-23 MED ORDER — SODIUM CHLORIDE 0.9 % WEIGHT BASED INFUSION
3.0000 mL/kg/h | INTRAVENOUS | Status: DC
  Administered 2015-02-24: 3 mL/kg/h via INTRAVENOUS

## 2015-02-23 MED ORDER — SODIUM CHLORIDE 0.9 % IJ SOLN: 3.0000 mL | INTRAMUSCULAR | Status: DC | PRN

## 2015-02-23 MED FILL — Heparin Sodium (Porcine) 2 Unit/ML in Sodium Chloride 0.9%: INTRAMUSCULAR | Qty: 500 | Status: AC

## 2015-02-23 NOTE — Progress Notes (Signed)
ANTICOAGULATION CONSULT NOTE - Initial Consult  Pharmacy Consult for heparin Indication: atrial fibrillation  No Known Allergies  Patient Measurements: Height: 5\' 7"  (170.2 cm) Weight: 177 lb 14.6 oz (80.7 kg) IBW/kg (Calculated) : 66.1 Heparin Dosing Weight: 80.7 kg  Vital Signs: Temp: 99.2 F (37.3 C) (12/22 0800) Temp Source: Oral (12/22 0800) BP: 135/83 mmHg (12/22 0800) Pulse Rate: 126 (12/22 0800)  Labs:  Recent Labs  02/22/15 1734 02/22/15 1738 02/22/15 2135 02/23/15 0111 02/23/15 0716  HGB 12.5* 14.3  --   --  11.8*  HCT 38.0* 42.0  --   --  35.7*  PLT 481*  --   --   --  406*  APTT 30  --   --   --   --   LABPROT 16.1*  --   --   --   --   INR 1.27  --   --   --   --   CREATININE 1.27* 1.20  --   --  0.66  TROPONINI  --   --  0.56* 0.63* 0.54*    Estimated Creatinine Clearance: 98.6 mL/min (by C-G formula based on Cr of 0.66).   Medical History: Past Medical History  Diagnosis Date  . Psoriasis 2003  . Rheumatoid arthritis(714.0) 2012  . Psoriatic arthritis ([place])   . Leukocytosis   . Lymphadenopathy   . Pulmonary fibrosis ([place])   . Hypertension   . GERD (gastroesophageal reflux disease)     Medications:  Prescriptions prior to admission  Medication Sig Dispense Refill Last Dose  . esomeprazole (NEXIUM) 40 MG capsule Take 40 mg by mouth daily at 12 noon.   Taking  . HUMIRA PEN 40 MG/0.8ML PNKT    Taking  . leflunomide (ARAVA) 20 MG tablet    Taking  . lisinopril-hydrochlorothiazide (PRINZIDE,ZESTORETIC) 20-25 MG per tablet Take 1 tablet by mouth daily.   Taking  . OTEZLA 30 MG TABS    Taking  . predniSONE (DELTASONE) 5 MG tablet Take 5 mg by mouth every morning.   Taking  . simvastatin (ZOCOR) 10 MG tablet Take 10 mg by mouth at bedtime.    Taking    Assessment: 41 yoM w/ hx of HTN, HLD and RA, but no prior cardiac history admitted for STEMI. Went to cath lab for emergent revascularization and found to have occluded LCx and OM. 2 DESs placed  and pt started on ASA + Brilinta. Per flowsheets, TR band removed at 0245 on 12/22. Pharmacy is now consulted to dose heparin IV for afib s/p cath.  Hgb 11.8, Plt 406  Goal of Therapy:  Heparin level 0.3-0.7 units/ml Monitor platelets by anticoagulation protocol: Yes   Plan:  Start heparin infusion at 1200 units/hr Check anti-Xa level in 6 hours and daily while on heparin Continue to monitor H&H and platelets  Governor Specking, PharmD Clinical Pharmacy Resident Pager: [phone] 02/23/2015,9:29 AM

## 2015-02-23 NOTE — Progress Notes (Signed)
CARDIAC REHAB PHASE I   PRE:  Rate/Rhythm: 121 aflutter  BP:  Supine:   Sitting: 121/73  Standing:    SaO2: 97%RA  MODE:  Ambulation: chair ft   POST:  Rate/Rhythm: 122 aflutter  BP:  Supine:   Sitting: 109/78  Standing:    SaO2: 97%RA 1110-1150 Since pt for staged procedure and heart rate 122 lying in bed, did not walk pt. Assisted pt to recliner with call bell. Heart rate about same. Gave pt MI booklet and stent card. Visitors present so did not educate. Will ed on other visits. Will continue to follow. BP lower sitting but no c/o dizziness.   Graylon Good, RN BSN  02/23/2015 11:47 AM

## 2015-02-23 NOTE — Progress Notes (Addendum)
Cardiologist: Dr. [name] Subjective:   AFIB post PCI/STEMI Mild dyspnea, no CP Weak  Objective:  Vital Signs in the last 24 hours: Temp:  [98.3 F (36.8 C)-99.8 F (37.7 C)] 98.3 F (36.8 C) (12/22 0400) Pulse Rate:  [0-138] 125 (12/22 0600) Resp:  [0-32] 28 (12/22 0600) BP: (83-160)/(45-113) 131/77 mmHg (12/22 0600) SpO2:  [0 %-100 %] 96 % (12/22 0600) Weight:  [177 lb 14.6 oz (80.7 kg)-182 lb 15.7 oz (83 kg)] 177 lb 14.6 oz (80.7 kg) (12/21 2000)  Intake/Output from previous day: 12/21 0701 - 12/22 0700 In: 2218.5 [P.O.:720; I.V.:1498.5] Out: 1575 [Urine:1575]   Physical Exam: General: Well developed, well nourished, in no acute distress. Head:  Normocephalic and atraumatic. Lungs: Clear to auscultation and percussion. Heart: Tachy reg.  No murmur, rubs or gallops.  Abdomen: soft, non-tender, positive bowel sounds. Extremities: No clubbing or cyanosis. No edema. Radial site intact Neurologic: Alert and oriented x 3.    Lab Results:  Recent Labs  02/22/15 1734 02/22/15 1738 02/23/15 0716  WBC 23.6*  --  23.0*  HGB 12.5* 14.3 11.8*  PLT 481*  --  406*    Recent Labs  02/22/15 1734 02/22/15 1738  NA 131* 132*  K 4.0 3.8  CL 95* 94*  CO2 24  --   GLUCOSE 158* 153*  BUN 19 22*  CREATININE 1.27* 1.20    Recent Labs  02/22/15 2135 02/23/15 0111  TROPONINI 0.56* 0.63*   Hepatic Function Panel  Recent Labs  02/22/15 2135  PROT 6.2*  ALBUMIN 2.0*  AST 28  ALT 18  ALKPHOS 61  BILITOT 0.7  BILIDIR 0.2  IBILI 0.5    Recent Labs  02/22/15 2135  CHOL 80     Imaging: Dg Chest Port 1 View  02/22/2015  CLINICAL DATA:  Pre heart catheterization. History of hypertension, pulmonary fibrosis. EXAM: PORTABLE CHEST 1 VIEW COMPARISON:  11/01/2013 FINDINGS: Cardiomegaly. Low lung volumes. Interstitial prominence, most pronounced in the lung bases which likely reflects a combination of fibrosis and atelectasis given the low volumes. No visible  significant effusion. IMPRESSION: Interstitial prominence, most likely interstitial lung disease/ fibrosis with superimposed bibasilar atelectasis. Low lung volumes. Electronically Signed   By: Rolm Baptise M.D.   On: 02/22/2015 18:01   Personally viewed.   Telemetry: AFIB/Flutter 120's Personally viewed.   EKG:  02/23/15- AFLUTTER likely 122bpm LBBB Personally viewed.  Cardiac Studies:    - Cath: 02/22/15 [name]) - OM stent/circ stent. Residual calcified prox LAD. Mid RCA 80% EF 45-50%   Meds: Scheduled Meds: . aspirin  81 mg Oral Daily  . atorvastatin  80 mg Oral q1800  . feeding supplement (ENSURE ENLIVE)  237 mL Oral BID BM  . hydrochlorothiazide  25 mg Oral Daily  . lisinopril  20 mg Oral Daily  . metoprolol tartrate  12.5 mg Oral BID  . pantoprazole  40 mg Oral Daily  . predniSONE  5 mg Oral q morning - 10a  . sodium chloride  3 mL Intravenous Q12H  . ticagrelor  90 mg Oral BID   Continuous Infusions: . amiodarone 30 mg/hr (02/23/15 0652)   PRN Meds:.sodium chloride, acetaminophen, morphine injection, ondansetron (ZOFRAN) IV, sodium chloride  Assessment/Plan:  Active Problems:   ST elevation (STEMI) myocardial infarction ([place])   ST elevation myocardial infarction involving left circumflex coronary artery (HCC)   ST elevation (STEMI) myocardial infarction involving other coronary artery of anterior wall (HCC)   ST elevation myocardial infarction (STEMI) of inferior wall (  [place])   STEMI   - Circ stents x 2 DES  - DAPT  - high dose statin  - metoprolol, Bb  - Cardiac rehab  - Residual 90% prox calcified LAD (to be staged)  - ACE-I  - ECHO  CAD  - OM and Circ stents  - residual LAD lesion (stage)  - Perhaps tomorrow PCI  Hyperlipidemia  - statin  Parox AFIB/flutter  - post MI  - On amio IV  - Bb - will give IV metop 5mg  Q49min x 3, increase po to 25mg  Q6.   - Will give heparin IV  - may need long term anticoagulation if does not convert soon  RA  -  pred  LBBB  Hypokalemia  - replete, K-Dur 40 BID  Keep in unit today.   SKAINS, [place] 02/23/2015, 8:14 AM

## 2015-02-23 NOTE — Progress Notes (Signed)
Nutrition Brief Note  Patient identified on the Malnutrition Screening Tool (MST) Report  Per pt he always has at least 2 meals per day and mostly has 3 meals per day. Lives at home with wife, both cook. Feels he might have lost a few pounds due to arthritis if he is in pain. Per chart review pt with 3% weight loss in 3 months. Although admission weight was 182 lb which would reflect no weight loss PTA.  No pain today. Pt likes supplements being offered and would like to continue these during admission.    Wt Readings from Last 15 Encounters:  02/22/15 177 lb 14.6 oz (80.7 kg)  11/09/14 183 lb 4.8 oz (83.144 kg)  10/07/14 184 lb 9.6 oz (83.734 kg)  10/22/12 197 lb (89.359 kg)  08/12/11 192 lb 9.6 oz (87.363 kg)  04/24/11 188 lb 3.2 oz (85.367 kg)  04/17/11 180 lb 4.8 oz (81.784 kg)  04/10/11 181 lb (82.101 kg)  04/10/11 181 lb 11.2 oz (82.419 kg)  03/21/11 181 lb 9.6 oz (82.373 kg)    Body mass index is 27.86 kg/(m^2). Patient meets criteria for overweight based on current BMI.   Current diet order is Heart Healthy. Labs and medications reviewed.   No nutrition interventions warranted at this time. If nutrition issues arise, please consult RD.   [place], [place], [place] Pager [phone] After Hours Pager

## 2015-02-23 NOTE — Progress Notes (Signed)
ANTICOAGULATION CONSULT NOTE  Pharmacy Consult for heparin Indication: atrial fibrillation  No Known Allergies  Patient Measurements: Height: 5\' 7"  (170.2 cm) Weight: 177 lb 14.6 oz (80.7 kg) IBW/kg (Calculated) : 66.1 Heparin Dosing Weight: 80.7 kg  Vital Signs: Temp: 98.9 F (37.2 C) (12/22 1224) Temp Source: Oral (12/22 1224) BP: 112/63 mmHg (12/22 1900) Pulse Rate: 116 (12/22 1900)  Labs:  Recent Labs  02/22/15 1734 02/22/15 1738 02/22/15 2135 02/23/15 0111 02/23/15 0716 02/23/15 1634  HGB 12.5* 14.3  --   --  11.8*  --   HCT 38.0* 42.0  --   --  35.7*  --   PLT 481*  --   --   --  406*  --   APTT 30  --   --   --   --   --   LABPROT 16.1*  --   --   --   --   --   INR 1.27  --   --   --   --   --   HEPARINUNFRC  --   --   --   --   --  <0.10*  CREATININE 1.27* 1.20  --   --  0.66  --   TROPONINI  --   --  0.56* 0.63* 0.54*  --     Estimated Creatinine Clearance: 98.6 mL/min (by C-G formula based on Cr of 0.66).   Medical History: Past Medical History  Diagnosis Date  . Psoriasis 2003  . Rheumatoid arthritis(714.0) 2012  . Psoriatic arthritis ([place])   . Leukocytosis   . Lymphadenopathy   . Pulmonary fibrosis ([place])   . Hypertension   . GERD (gastroesophageal reflux disease)     Medications:  Prescriptions prior to admission  Medication Sig Dispense Refill Last Dose  . Apremilast (OTEZLA) 30 MG TABS Take 1 tablet by mouth 2 (two) times daily.   02/22/2015 at Unknown time  . esomeprazole (NEXIUM) 40 MG capsule Take 40 mg by mouth daily at 12 noon.   02/22/2015 at Unknown time  . leflunomide (ARAVA) 20 MG tablet Take 20 mg by mouth daily.    02/22/2015 at Unknown time  . lisinopril-hydrochlorothiazide (PRINZIDE,ZESTORETIC) 20-25 MG per tablet Take 1 tablet by mouth daily.   02/22/2015 at Unknown time  . simvastatin (ZOCOR) 10 MG tablet Take 10 mg by mouth at bedtime.    02/22/2015 at Unknown time    Assessment: 67 yoM w/ hx of HTN, HLD and RA, but no  prior cardiac history admitted for STEMI. Went to cath lab for emergent revascularization and found to have occluded LCx and OM. 2 DESs placed and pt started on ASA + Brilinta. Per flowsheets, TR band removed at 0245 on 12/22. Pharmacy is now consulted to dose heparin IV for afib s/p cath - no bolus.  HL undetectable on 1200 units/h. Hg 11.8, plt 406, no bleed/IV line issues per RN.  Goal of Therapy:  Heparin level 0.3-0.7 units/ml Monitor platelets by anticoagulation protocol: Yes   Plan:  Increase heparin to 1500 units/h 6h HL Daily HL/CBC Mon s/sx bleeding  Elicia Lamp, PharmD, BCPS Clinical Pharmacist Pager [phone] 02/23/2015 7:22 PM

## 2015-02-23 NOTE — Progress Notes (Signed)
Utilization review completed.  

## 2015-02-23 NOTE — Progress Notes (Signed)
  Echocardiogram 2D Echocardiogram has been performed.  Curtis Flores 02/23/2015, 9:57 AM

## 2015-02-24 ENCOUNTER — Encounter (HOSPITAL_COMMUNITY)
Admission: EM | Disposition: A | Discharge status: Home / Self Care | Payer: Self-pay | Source: Home / Self Care | Attending: Cardiovascular Disease

## 2015-02-24 LAB — CBC
HCT: 31.6 % — ABNORMAL LOW (ref 39.0–52.0)
HEMOGLOBIN: 10.4 g/dL — AB (ref 13.0–17.0)
MCH: 25.5 pg — ABNORMAL LOW (ref 26.0–34.0)
MCHC: 32.9 g/dL (ref 30.0–36.0)
MCV: 77.5 fL — ABNORMAL LOW (ref 78.0–100.0)
Platelets: 380 10*3/uL (ref 150–400)
RBC: 4.08 MIL/uL — AB (ref 4.22–5.81)
RDW: 14.6 % (ref 11.5–15.5)
WBC: 19.3 10*3/uL — ABNORMAL HIGH (ref 4.0–10.5)

## 2015-02-24 LAB — BASIC METABOLIC PANEL
ANION GAP: 7 (ref 5–15)
BUN: 9 mg/dL (ref 6–20)
CO2: 26 mmol/L (ref 22–32)
Calcium: 8.3 mg/dL — ABNORMAL LOW (ref 8.9–10.3)
Chloride: 101 mmol/L (ref 101–111)
Creatinine, Ser: 0.58 mg/dL — ABNORMAL LOW (ref 0.61–1.24)
GFR calc Af Amer: >60 mL/min (ref >60)
Glucose, Bld: 102 mg/dL — ABNORMAL HIGH (ref 65–99)
POTASSIUM: 3.7 mmol/L (ref 3.5–5.1)
SODIUM: 134 mmol/L — AB (ref 135–145)

## 2015-02-24 LAB — HEMOGLOBIN A1C
HEMOGLOBIN A1C: 6 % — AB (ref 4.8–5.6)
MEAN PLASMA GLUCOSE: 126 mg/dL

## 2015-02-24 LAB — HEPARIN LEVEL (UNFRACTIONATED)

## 2015-02-24 SURGERY — CORONARY STENT INTERVENTION
Anesthesia: LOCAL

## 2015-02-24 NOTE — Care Management Note (Signed)
Case Management Note  Patient Details  Name: Curtis Flores MRN: [id number] Date of Birth: June 04, 1955  Subjective/Objective:  Pt admitted for AFIB post PCI/STEMI now converted to NSR. Pt needed Brilinta.                 Action/Plan: 30 day free card provided and [facility] has medication available. No further needs from CM at this time.    Bethena Roys, RN 02/24/2015, 3:25 PM

## 2015-02-24 NOTE — Progress Notes (Signed)
ANTICOAGULATION CONSULT NOTE - Follow Up Consult  Pharmacy Consult for Heparin  Indication: atrial fibrillation, s/p cath  No Known Allergies  Patient Measurements: Height: 5\' 7"  (170.2 cm) Weight: 177 lb 14.6 oz (80.7 kg) IBW/kg (Calculated) : 66.1  Vital Signs: Temp: 98.5 F (36.9 C) (12/22 2343) Temp Source: Oral (12/22 2343) BP: 110/70 mmHg (12/23 0200) Pulse Rate: 120 (12/23 0200)  Labs:  Recent Labs  02/22/15 1734 02/22/15 1738 02/22/15 2135 02/23/15 0111 02/23/15 0716 02/23/15 1634 02/24/15 0258  HGB 12.5* 14.3  --   --  11.8*  --  10.4*  HCT 38.0* 42.0  --   --  35.7*  --  31.6*  PLT 481*  --   --   --  406*  --  380  APTT 30  --   --   --   --   --   --   LABPROT 16.1*  --   --   --   --   --   --   INR 1.27  --   --   --   --   --   --   HEPARINUNFRC  --   --   --   --   --  <0.10* <0.10*  CREATININE 1.27* 1.20  --   --  0.66  --   --   TROPONINI  --   --  0.56* 0.63* 0.54*  --   --     Estimated Creatinine Clearance: 98.6 mL/min (by C-G formula based on Cr of 0.66).   Assessment: Heparin level remains undetectable despite rate increase  Goal of Therapy:  Heparin level 0.3-0.7 units/ml Monitor platelets by anticoagulation protocol: Yes   Plan:  -No bolus s/p cath -Increase heparin drip to 1900 units/hr -1100 HL -Daily CBC/HL -Monitor for bleeding  Narda Bonds 02/24/2015,3:38 AM

## 2015-02-24 NOTE — Progress Notes (Addendum)
Cardiologist: Dr. [name] Subjective:   AFIB post PCI/STEMI now converted to NSR Minimal to no dyspnea, no CP.  Weakness improved.  Wife and son in room  Objective:  Vital Signs in the last 24 hours: Temp:  [98.4 F (36.9 C)-98.9 F (37.2 C)] 98.4 F (36.9 C) (12/23 0400) Pulse Rate:  [71-121] 74 (12/23 0600) Resp:  [19-32] 19 (12/23 0600) BP: (88-129)/(61-87) 129/74 mmHg (12/23 0600) SpO2:  [94 %-100 %] 100 % (12/23 0600) Weight:  [177 lb 0.5 oz (80.3 kg)] 177 lb 0.5 oz (80.3 kg) (12/23 0500)  Intake/Output from previous day: 12/22 0701 - 12/23 0700 In: 1304.2 [P.O.:600; I.V.:704.2] Out: 625 [Urine:625]   Physical Exam: General: Well developed, well nourished, in no acute distress. Head:  Normocephalic and atraumatic. Lungs: Clear to auscultation and percussion. Heart: Tachy reg.  No murmur, rubs or gallops.  Abdomen: soft, non-tender, positive bowel sounds. Extremities: No clubbing or cyanosis. No edema. Radial site intact Neurologic: Alert and oriented x 3.    Lab Results:  Recent Labs  02/23/15 0716 02/24/15 0258  WBC 23.0* 19.3*  HGB 11.8* 10.4*  PLT 406* 380    Recent Labs  02/23/15 0716 02/24/15 0441  NA 132* 134*  K 2.9* 3.7  CL 97* 101  CO2 24 26  GLUCOSE 144* 102*  BUN 10 9  CREATININE 0.66 0.58*    Recent Labs  02/23/15 0111 02/23/15 0716  TROPONINI 0.63* 0.54*   Hepatic Function Panel  Recent Labs  02/22/15 2135  PROT 6.2*  ALBUMIN 2.0*  AST 28  ALT 18  ALKPHOS 61  BILITOT 0.7  BILIDIR 0.2  IBILI 0.5    Recent Labs  02/22/15 2135  CHOL 80     Imaging: Dg Chest Port 1 View  02/22/2015  CLINICAL DATA:  Pre heart catheterization. History of hypertension, pulmonary fibrosis. EXAM: PORTABLE CHEST 1 VIEW COMPARISON:  11/01/2013 FINDINGS: Cardiomegaly. Low lung volumes. Interstitial prominence, most pronounced in the lung bases which likely reflects a combination of fibrosis and atelectasis given the low volumes. No  visible significant effusion. IMPRESSION: Interstitial prominence, most likely interstitial lung disease/ fibrosis with superimposed bibasilar atelectasis. Low lung volumes. Electronically Signed   By: Rolm Baptise M.D.   On: 02/22/2015 18:01   Personally viewed.   Telemetry: AFIB/Flutter 120's now NSR 73 Personally viewed.   EKG:  02/23/15- AFLUTTER likely 122bpm LBBB Personally viewed.  Cardiac Studies:    - Cath: 02/22/15 [name]) - OM stent/circ stent. Residual calcified prox LAD. Mid RCA 80% EF 45-50%   Meds: Scheduled Meds: . aspirin  81 mg Oral Daily  . atorvastatin  80 mg Oral q1800  . feeding supplement (ENSURE ENLIVE)  237 mL Oral BID BM  . hydrochlorothiazide  25 mg Oral Daily  . lisinopril  20 mg Oral Daily  . metoprolol tartrate  25 mg Oral QID  . pantoprazole  40 mg Oral Daily  . potassium chloride  40 mEq Oral BID  . predniSONE  5 mg Oral q morning - 10a  . sodium chloride  3 mL Intravenous Q12H  . sodium chloride  3 mL Intravenous Q12H  . ticagrelor  90 mg Oral BID   Continuous Infusions: . sodium chloride 1 mL/kg/hr (02/24/15 0700)  . amiodarone 30 mg/hr (02/24/15 0547)  . heparin 1,900 Units/hr (02/24/15 0547)   PRN Meds:.sodium chloride, sodium chloride, acetaminophen, metoprolol, morphine injection, ondansetron (ZOFRAN) IV, sodium chloride, sodium chloride  Assessment/Plan:  Active Problems:   ST elevation (STEMI) myocardial  infarction ([place])   ST elevation myocardial infarction involving left circumflex coronary artery (HCC)   ST elevation (STEMI) myocardial infarction involving other coronary artery of anterior wall (HCC)   ST elevation myocardial infarction (STEMI) of inferior wall (HCC)   STEMI   - Circ stents x 2 DES  - DAPT  - high dose statin  - metoprolol, Bb  - Cardiac rehab  - Residual 80-90% prox calcified LAD (to be staged) today. Will discuss with Dr. [name]  - ACE-I  - ECHO  CAD  - OM and Circ stents  - residual LAD lesion (stage)    Hyperlipidemia  - statin  Parox AFIB/flutter  - post MI  - On amio IV, now converted will stop amiodarone.   - Bb - will give IV metop 5mg  Q35min x 3, increase po to 25mg  Q6.   - Will give heparin IV  - Since converted in short time, will not likely need long term anticoagulation (also beneficial since on Brilinta and ASA)  RA  - pred  LBBB  Hypokalemia  - replete, K-Dur 40 BID  If stable post LAD PCI consider transfer to floor.   Candee Furbish 02/24/2015, 8:13 AM    ADDENDUM:  Dr. [name] has decided to hold off on PCI of LAD (recent circ infarct, stable LAD lesion). In about one month, NUC stress to evaluate for ischemia in the anterior territory.   Will stop heparin IV Will transfer to floor Cardiac rehab Possible DC tomorrow.   Candee Furbish, MD

## 2015-02-24 NOTE — Progress Notes (Signed)
CARDIAC REHAB PHASE I   PRE:  Rate/Rhythm: 76 SR  BP:  Supine: 131/80  Sitting:   Standing:    SaO2: 98%RA  MODE:  Ambulation: 400 ft   POST:  Rate/Rhythm: 85 SR  BP:  Supine:   Sitting: 130/70  Standing:    SaO2: 89-98%RA 1326-1420 Pt walked 400 ft with steady gait. No CP. Tolerated well. MI education completed with pt and wife who voiced understanding. Stressed importance of brilinta with stent. Case manager in to give stent card. Discussed NTG use and importance of letting cardiologist know if recurrent CP. Discussed CRP 2 and will refer to [facility]. Pt knows that if he needs staged PCI he cannot start program until then. Pt wife with diet questions that were answered. Both very receptive.   Graylon Good, RN BSN  02/24/2015 2:14 PM

## 2015-02-25 DIAGNOSIS — I2119 ST elevation (STEMI) myocardial infarction involving other coronary artery of inferior wall: Principal | ICD-10-CM

## 2015-02-25 LAB — CBC
HEMATOCRIT: 30.3 % — AB (ref 39.0–52.0)
Hemoglobin: 10 g/dL — ABNORMAL LOW (ref 13.0–17.0)
MCH: 25.6 pg — AB (ref 26.0–34.0)
MCHC: 33 g/dL (ref 30.0–36.0)
MCV: 77.5 fL — AB (ref 78.0–100.0)
PLATELETS: 411 10*3/uL — AB (ref 150–400)
RBC: 3.91 MIL/uL — ABNORMAL LOW (ref 4.22–5.81)
RDW: 14.9 % (ref 11.5–15.5)
WBC: 16.3 10*3/uL — AB (ref 4.0–10.5)

## 2015-02-25 MED ORDER — TICAGRELOR 90 MG PO TABS: 90.0000 mg | ORAL_TABLET | Freq: Two times a day (BID) | ORAL | Status: DC

## 2015-02-25 MED ORDER — ATORVASTATIN CALCIUM 80 MG PO TABS: 80.0000 mg | ORAL_TABLET | Freq: Every day | ORAL | Status: DC

## 2015-02-25 MED ORDER — PANTOPRAZOLE SODIUM 40 MG PO TBEC: 40.0000 mg | DELAYED_RELEASE_TABLET | Freq: Every day | ORAL | Status: DC

## 2015-02-25 MED ORDER — METOPROLOL TARTRATE 50 MG PO TABS: 50.0000 mg | ORAL_TABLET | Freq: Two times a day (BID) | ORAL | Status: DC

## 2015-02-25 MED ORDER — METOPROLOL TARTRATE 50 MG PO TABS
50.0000 mg | ORAL_TABLET | Freq: Two times a day (BID) | ORAL | Status: DC
  Filled 2015-02-25: qty 1

## 2015-02-25 MED ORDER — ASPIRIN 81 MG PO CHEW: 81.0000 mg | CHEWABLE_TABLET | Freq: Every day | ORAL | Status: DC

## 2015-02-25 NOTE — Discharge Summary (Signed)
Discharge Summary   Patient ID: Curtis Flores,  MRN: [id number], DOB/AGE: 10/18/1955 59 y.o.  Admit date: 02/22/2015 Discharge date: 02/25/2015  Primary Care Provider: [name] Primary Cardiologist: Dr. [name]  Discharge Diagnoses Active Problems:   ST elevation (STEMI) myocardial infarction [facility])   ST elevation myocardial infarction involving left circumflex coronary artery (HCC)   ST elevation (STEMI) myocardial infarction involving other coronary artery of anterior wall (HCC)   ST elevation myocardial infarction (STEMI) of inferior wall (HCC)   Hyperlipidemia   Parox AFIB/flutter post PCI   LBBB   Hypokalemia   Leucocytosis     Allergies No Known Allergies  Consultant: None  Procedures   Coronary Stent Intervention 02/22/15   Left Heart Cath and Coronary Angiography    Conclusion     Mid RCA lesion, 80% stenosed.  Ost LAD to Prox LAD lesion, 80% stenosed.  Ost 1st Mrg to 1st Mrg lesion, 80% stenosed. Post intervention, there is a 0% residual stenosis.  Prox Cx to Mid Cx lesion, 99% stenosed. Post intervention, there is a 0% residual stenosis.  There is mild to moderate left ventricular systolic dysfunction.     Echo 02/23/15 LV EF: 35% -  40%  ------------------------------------------------------------------- Indications:   MI - follow-up 410.92.  ------------------------------------------------------------------- History:  PMH: STEMI. Risk factors: Hypertension.  ------------------------------------------------------------------- Study Conclusions  - Left ventricle: The cavity size was normal. Wall thickness was increased in a pattern of mild LVH. Systolic function was moderately reduced. The estimated ejection fraction was in the range of 35% to 40%. Diffuse hypokinesis. There is akinesis of the inferior and inferoseptal myocardium. - Mitral valve: Calcified annulus. - Left atrium: The atrium was mildly  dilated. - Right atrium: The atrium was mildly dilated. - Tricuspid valve: There was mild-moderate regurgitation. - Pulmonary arteries: Systolic pressure was mildly increased. PA peak pressure: 48 mm Hg (S). - Pericardium, extracardiac: A small pericardial effusion was identified.  Impressions:  - Technically difficult; EF difficult to quantitate due to tachycardia and atrial arrhythmias; global hypokinesis with akinesis of the inferior and inferoseptal wall; overall moderately reduced LV function; mild biatrial enlargement; mild to moderate TR; mildly elevated pulmonary pressure; small pericardial effusion.   History of Present Illness  59 y/o male with h/o HTN, HLD and RA but no prior cardiac history presented with CP and ST elevations/LBBB. Patient complained of SSCP off and on for the last 3 days. CODE STEMI activated and patient taken directly to the [facility] cath lab for emergent revascularization.  Hospital Course  Cath showed severe LCx and OM disease. S/p PCI + DES. LVF ok. Residual 90% prox calcified LAD. DAPT with ASA and brilinta. Post procedure patient had afib, place on IV amiodarone and IV heparin. He converted to NSR in short time, no felt that he  need long term anticoagulation (also beneficial since on Brilinta and ASA). Changed amiodarone to IV metoprool and then to PO metoprolol. Given supplement for low K. Ambulated will with cardiac rehab.  Echo with LV ef of 35-40%, mild LVH, diffuse hypokinesis, mild LA dilation, mild RA dilation, mild-mod TR, 48 mm Hg (S), small pericardial effusion. Technically difficult studay.   She has been seen by Dr. [name] today and deemed ready for discharge home. All follow-up appointments have been scheduled. Discharge medications are listed below.   He residual 80-90% prox calcified LAD -->TCM appointment in 7-10 days. lexiscan in 3 weeks (will need order during TCM) and then f/u appointment with Dr. [name] to discuuss staged  PCI. Change Nexium to Protonix.  Consider OP f/u labs 6-8 weeks given lipitor initiation this admission.   Discharge Vitals Blood pressure 120/58, pulse 68, temperature 97.6 F (36.4 C), temperature source Oral, resp. rate 18, height 5\' 7"  (1.702 m), weight 177 lb 0.5 oz (80.3 kg), SpO2 97 %.  Filed Weights   02/22/15 1734 02/22/15 2000 02/24/15 0500  Weight: 182 lb 15.7 oz (83 kg) 177 lb 14.6 oz (80.7 kg) 177 lb 0.5 oz (80.3 kg)    Labs  CBC  Recent Labs  02/24/15 0258 02/25/15 0306  WBC 19.3* 16.3*  HGB 10.4* 10.0*  HCT 31.6* 30.3*  MCV 77.5* 77.5*  PLT 380 123456*   Basic Metabolic Panel  Recent Labs  02/23/15 0716 02/24/15 0441  NA 132* 134*  K 2.9* 3.7  CL 97* 101  CO2 24 26  GLUCOSE 144* 102*  BUN 10 9  CREATININE 0.66 0.58*  CALCIUM 8.2* 8.3*   Liver Function Tests  Recent Labs  02/22/15 1734 02/22/15 2135  AST 30 28  ALT 20 18  ALKPHOS 73 61  BILITOT 0.7 0.7  PROT 7.1 6.2*  ALBUMIN 2.3* 2.0*   No results for input(s): LIPASE, AMYLASE in the last 72 hours. Cardiac Enzymes  Recent Labs  02/22/15 2135 02/23/15 0111 02/23/15 0716  TROPONINI 0.56* 0.63* 0.54*   Hemoglobin A1C  Recent Labs  02/22/15 2135  HGBA1C 6.0*   Fasting Lipid Panel  Recent Labs  02/22/15 2135  CHOL 80  HDL 20*  LDLCALC 48  TRIG 61  CHOLHDL 4.0    Disposition  Pt is being discharged home today in good condition.  Follow-up Plans & Appointments  Follow-up Information    Follow up with Quay Burow, MD.   Specialties:  Cardiology, Radiology   Why:  office will call with appoinment Tuesday   Contact information:   [address] [place] [address] [phone]       Discharge Instructions    AMB Referral to Cardiac Rehabilitation - Phase II    Complete by:  As directed   Diagnosis:  Myocardial Infarction     Amb Referral to Cardiac Rehabilitation    Complete by:  As directed   Diagnosis:   Myocardial Infarction PCI        Diet - low sodium heart healthy    Complete by:  As directed      Discharge instructions    Complete by:  As directed   No driving for 2 weeks. No lifting over 10 lbs for 4 weeks. No sexual activity for 4 weeks. You may not return to work until cleared by your cardiologist, after first office visit. Keep procedure site clean & dry. If you notice increased pain, swelling, bleeding or pus, call/return!  You may shower, but no soaking baths/hot tubs/pools for 1 week.     Increase activity slowly    Complete by:  As directed            F/u Labs/Studies: Consider OP f/u labs 6-8 weeks given statin initiation this admission.  Discharge Medications    Medication List    STOP taking these medications        esomeprazole 40 MG capsule  Commonly known as:  [place] by:  pantoprazole 40 MG tablet     simvastatin 10 MG tablet  Commonly known as:  ZOCOR      TAKE these medications        aspirin 81 MG chewable  tablet  Chew 1 tablet (81 mg total) by mouth daily.     atorvastatin 80 MG tablet  Commonly known as:  LIPITOR  Take 1 tablet (80 mg total) by mouth daily at 6 PM.     leflunomide 20 MG tablet  Commonly known as:  ARAVA  Take 20 mg by mouth daily.     lisinopril-hydrochlorothiazide 20-25 MG tablet  Commonly known as:  PRINZIDE,ZESTORETIC  Take 1 tablet by mouth daily.     metoprolol 50 MG tablet  Commonly known as:  LOPRESSOR  Take 1 tablet (50 mg total) by mouth 2 (two) times daily.     OTEZLA 30 MG Tabs  Generic drug:  Apremilast  Take 1 tablet by mouth 2 (two) times daily.     pantoprazole 40 MG tablet  Commonly known as:  PROTONIX  Take 1 tablet (40 mg total) by mouth daily.     ticagrelor 90 MG Tabs tablet  Commonly known as:  BRILINTA  Take 1 tablet (90 mg total) by mouth 2 (two) times daily.        Duration of Discharge Encounter   Greater than 30 minutes including physician time.  Signed, Madylyn Insco PA-C 02/25/2015, 8:45  AM

## 2015-02-25 NOTE — Progress Notes (Signed)
Patient Name: Curtis Flores Date of Encounter: 02/25/2015   SUBJECTIVE  Feeling well. No chest pain, sob or palpitations. Maintaining sinus rhythm.   CURRENT MEDS . aspirin  81 mg Oral Daily  . atorvastatin  80 mg Oral q1800  . feeding supplement (ENSURE ENLIVE)  237 mL Oral BID BM  . hydrochlorothiazide  25 mg Oral Daily  . lisinopril  20 mg Oral Daily  . metoprolol tartrate  25 mg Oral QID  . pantoprazole  40 mg Oral Daily  . predniSONE  5 mg Oral q morning - 10a  . sodium chloride  3 mL Intravenous Q12H  . ticagrelor  90 mg Oral BID    OBJECTIVE  Filed Vitals:   02/24/15 1100 02/24/15 1355 02/24/15 2140 02/25/15 0508  BP: 123/101 131/80 117/68 120/58  Pulse: 76 75 60 68  Temp: 98.5 F (36.9 C) 98 F (36.7 C) 97.8 F (36.6 C) 97.6 F (36.4 C)  TempSrc: Oral Oral Oral Oral  Resp: 20 19 18 18   Height:      Weight:      SpO2: 99% 99% 99% 97%    Intake/Output Summary (Last 24 hours) at 02/25/15 0804 Last data filed at 02/25/15 0713  Gross per 24 hour  Intake 246.68 ml  Output   1725 ml  Net -1478.32 ml   Filed Weights   02/22/15 1734 02/22/15 2000 02/24/15 0500  Weight: 182 lb 15.7 oz (83 kg) 177 lb 14.6 oz (80.7 kg) 177 lb 0.5 oz (80.3 kg)    PHYSICAL EXAM  General: Pleasant, NAD. Neuro: Alert and oriented X 3. Moves all extremities spontaneously. Psych: Normal affect. HEENT:  Normal  Neck: Supple without bruits or JVD. Lungs:  Resp regular and unlabored, faint bibasilar rales.  Heart: RRR no s3, s4, or murmurs. Abdomen: Soft, non-tender, non-distended, BS + x 4.  Extremities: No clubbing, cyanosis or edema. DP/PT/Radials 2+ and equal bilaterally.  Accessory Clinical Findings  CBC  Recent Labs  02/24/15 0258 02/25/15 0306  WBC 19.3* 16.3*  HGB 10.4* 10.0*  HCT 31.6* 30.3*  MCV 77.5* 77.5*  PLT 380 123456*   Basic Metabolic Panel  Recent Labs  02/23/15 0716 02/24/15 0441  NA 132* 134*  K 2.9* 3.7  CL 97* 101  CO2 24 26  GLUCOSE  144* 102*  BUN 10 9  CREATININE 0.66 0.58*  CALCIUM 8.2* 8.3*   Liver Function Tests  Recent Labs  02/22/15 1734 02/22/15 2135  AST 30 28  ALT 20 18  ALKPHOS 73 61  BILITOT 0.7 0.7  PROT 7.1 6.2*  ALBUMIN 2.3* 2.0*   No results for input(s): LIPASE, AMYLASE in the last 72 hours. Cardiac Enzymes  Recent Labs  02/22/15 2135 02/23/15 0111 02/23/15 0716  TROPONINI 0.56* 0.63* 0.54*   BNP Invalid input(s): POCBNP D-Dimer No results for input(s): DDIMER in the last 72 hours. Hemoglobin A1C  Recent Labs  02/22/15 2135  HGBA1C 6.0*   Fasting Lipid Panel  Recent Labs  02/22/15 2135  CHOL 80  HDL 20*  LDLCALC 48  TRIG 61  CHOLHDL 4.0    TELE  Sinus rhythm   Radiology/Studies  Dg Chest Port 1 View  02/22/2015  CLINICAL DATA:  Pre heart catheterization. History of hypertension, pulmonary fibrosis. EXAM: PORTABLE CHEST 1 VIEW COMPARISON:  11/01/2013 FINDINGS: Cardiomegaly. Low lung volumes. Interstitial prominence, most pronounced in the lung bases which likely reflects a combination of fibrosis and atelectasis given the low volumes. No visible significant effusion.  IMPRESSION: Interstitial prominence, most likely interstitial lung disease/ fibrosis with superimposed bibasilar atelectasis. Low lung volumes. Electronically Signed   By: Rolm Baptise M.D.   On: 02/22/2015 18:01    ASSESSMENT AND PLAN Active Problems:   ST elevation (STEMI) myocardial infarction (HCC)   ST elevation myocardial infarction involving left circumflex coronary artery (HCC)   ST elevation (STEMI) myocardial infarction involving other coronary artery of anterior wall (HCC)   ST elevation myocardial infarction (STEMI) of inferior wall (HCC)    STEMI  - Circ stents x 2 DES - DAPT - high dose statin - metoprolol, Bb - Cardiac rehab - Residual 80-90% prox calcified LAD, TCM appointment in 7-10 days. lexiscan in 3 weeks and then f/u appointment with Dr. [name] to discuuss staged  PCI.  - ACE-I -Echo 02/23/15  with LV ef of 35-40%, mild LVH, diffuse hypokinesis, mild LA dilation, mild RA dilation, mild-mod TR, 48 mm Hg (S), small pericardial effusion. Technically difficult studay.   CAD - OM and Circ stents - residual LAD lesion (stage)  Hyperlipidemia - statin  Parox AFIB/flutter - post MI - On amio IV, now converted will stop amiodarone.  - Bb - will give IV metop 5mg  Q87min x 3, increase po to 25mg  Q6. MD to decide further dosage. Maintaining sinus rhythm.  - Since converted in short time, will not likely need long term anticoagulation (also beneficial since on Brilinta and ASA)  RA - pred  LBBB  Hypokalemia -Resolved   Signed, Bhagat,Bhavinkumar PA-C Pager [phone]  Agree with note by Vin Bhagat PA-C  POD #4 Lateral STEMI s/p Prox LCX/OM " T" stenting using Synergy DES. On DAPT. EF 35-45%. PAF peri MI, now NSR. On approp meds. Ambulating w/o difficuulty. Labs OK except mildly elevated WBC but decreasing. OK for DC home. TOC 7. [facility]. Will need 2D and Lexiscan 3 weeks followed by ROV with me. Will decide at that time re staged PCI vs continued Med Rx.     Lorretta Harp, M.D., [place], [facility], [place] [address] [place], [place]  [address]  [phone] 02/25/2015 8:45 AM

## 2015-02-25 NOTE — Progress Notes (Signed)
CARDIAC REHAB PHASE I   PRE:  Rate/Rhythm: 69  BP:   Sitting: 124/54     SaO2: 98% RA  MODE:  Ambulation: 500 ft   POST:  Rate/Rhythm: 78  BP:   Sitting: 134/60     SaO2: 98%RA  825-842 Pt ambulated 531ft at a very steady gait and independently. Pt maintained a nice walking pace without any complaints of fatigue, dizziness, lightheadedness or extreme SOB.  Returned pt to recliner with VSS. Call bell within reach. Briefly discussed importance of Brilinta/ASA and emergency use of NTG. Pt verbalized understanding.   Briah Nary D Melodee Lupe,MS,ACSM-RCEP 02/25/2015 8:37 AM

## 2015-02-28 ENCOUNTER — Telehealth: Payer: Self-pay | Admitting: Cardiovascular Disease

## 2015-02-28 NOTE — Telephone Encounter (Signed)
Patient contacted regarding discharge from [facility] on 02/25/2015.  Patient understands to follow up with provider Dr Quay Burow  on 03/07/2015 at 10:00 at [facility]. Patient understands discharge instructions? Yes Patient understands medications and regiment? Yes Patient understands to bring all medications to this visit? Yes

## 2015-02-28 NOTE — Telephone Encounter (Signed)
TCM .. APpt is on 03/07/15 w/ Dr. [name] .   Thanks

## 2015-03-07 ENCOUNTER — Ambulatory Visit (INDEPENDENT_AMBULATORY_CARE_PROVIDER_SITE_OTHER): Payer: BLUE CROSS/BLUE SHIELD | Admitting: Cardiovascular Disease

## 2015-03-07 ENCOUNTER — Encounter: Payer: Self-pay | Admitting: Cardiovascular Disease

## 2015-03-07 VITALS — BP 98/62 | HR 96 | Ht 67.0 in | Wt 174.0 lb

## 2015-03-07 DIAGNOSIS — E785 Hyperlipidemia, unspecified: Secondary | ICD-10-CM

## 2015-03-07 DIAGNOSIS — E782 Mixed hyperlipidemia: Secondary | ICD-10-CM | POA: Insufficient documentation

## 2015-03-07 MED ORDER — HYDROCHLOROTHIAZIDE 12.5 MG PO CAPS: 12.5000 mg | ORAL_CAPSULE | Freq: Every day | ORAL | Status: DC

## 2015-03-07 MED ORDER — TICAGRELOR 90 MG PO TABS: 90.0000 mg | ORAL_TABLET | Freq: Two times a day (BID) | ORAL | Status: DC

## 2015-03-07 MED ORDER — LISINOPRIL 5 MG PO TABS: 5.0000 mg | ORAL_TABLET | Freq: Every day | ORAL | Status: DC

## 2015-03-07 MED ORDER — CARVEDILOL 12.5 MG PO TABS: 12.5000 mg | ORAL_TABLET | Freq: Two times a day (BID) | ORAL | Status: DC

## 2015-03-07 NOTE — Progress Notes (Signed)
03/07/2015 Curtis Flores   10-20-1955  [id number]  Primary Physician Curtis Mustache, MD Primary Cardiologist: Curtis Harp MD Curtis Flores   HPI:  Curtis Flores is a 60 year old thin-appearing married African-American male father of 2, grandfather to 3 grandchildren accompanied by his wife today. His primary care physician is Dr. Edrick Flores in medicine. He has a remote history of tobacco abuse as well as a history of hypertension and hyperlipidemia rheumatoid arthritis. He had an inferolateral STEMI on 02/22/15. I took him to the Cath Lab and stented his proximal dominant circumflex and first obtuse marginal branch with drug-eluting stents (T stenting). He had residual 90% stenosis and a mid nondominant RCA 80% proximal LAD stenosis. His EF was 35-40% by 2-D echo. Today he feels dizzy and short of breath. There is a history of pulmonary fibrosis however as well.   Current Outpatient Prescriptions  Medication Sig Dispense Refill  . Apremilast (OTEZLA) 30 MG TABS Take 1 tablet by mouth 2 (two) times daily.    [name] aspirin 81 MG chewable tablet Chew 1 tablet (81 mg total) by mouth daily.    [name] atorvastatin (LIPITOR) 80 MG tablet Take 1 tablet (80 mg total) by mouth daily at 6 PM. 30 tablet 6  . leflunomide (ARAVA) 20 MG tablet Take 20 mg by mouth daily.     . pantoprazole (PROTONIX) 40 MG tablet Take 1 tablet (40 mg total) by mouth daily. 30 tablet 11  . carvedilol (COREG) 12.5 MG tablet Take 1 tablet (12.5 mg total) by mouth 2 (two) times daily. 60 tablet 0  . hydrochlorothiazide (MICROZIDE) 12.5 MG capsule Take 1 capsule (12.5 mg total) by mouth daily. 30 capsule 0  . lisinopril (PRINIVIL,ZESTRIL) 5 MG tablet Take 1 tablet (5 mg total) by mouth daily. 30 tablet 0  . ticagrelor (BRILINTA) 90 MG TABS tablet Take 1 tablet (90 mg total) by mouth 2 (two) times daily. 60 tablet 0   No current facility-administered medications for this visit.    No Known Allergies  Social History    Social History  . Marital Status: Married    Spouse Name: N/A  . Number of Children: 2  . Years of Education: N/A   Occupational History  . unemployed     not working do to arthritis; used to be Patent attorney for a [employer] History Main Topics  . Smoking status: Former Smoker -- 1.00 packs/day for 30 years    Types: Cigarettes    Quit date: 03/21/2003  . Smokeless tobacco: Never Used  . Alcohol Use: No     Comment: H/O case of beer weekly x 20 years, quiting in 2000-ish.  . Drug Use: No     Comment: H/O marijuana use many years ago.  [name] Sexual Activity: Yes    Birth Control/ Protection: None   Other Topics Concern  . Not on file   Social History Narrative     Review of Systems: General: negative for chills, fever, night sweats or weight changes.  Cardiovascular: negative for chest pain, dyspnea on exertion, edema, orthopnea, palpitations, paroxysmal nocturnal dyspnea or shortness of breath Dermatological: negative for rash Respiratory: negative for cough or wheezing Urologic: negative for hematuria Abdominal: negative for nausea, vomiting, diarrhea, bright red blood per rectum, melena, or hematemesis Neurologic: negative for visual changes, syncope, or dizziness All other systems reviewed and are otherwise negative except as noted above.    Blood pressure 98/62, pulse 96, height 5\' 7"  (  1.702 m), weight 174 lb (78.926 kg).  General appearance: alert and no distress Neck: no adenopathy, no carotid bruit, no JVD, supple, symmetrical, trachea midline and thyroid not enlarged, symmetric, no tenderness/mass/nodules Lungs: clear to auscultation bilaterally Heart: regular rate and rhythm, S1, S2 normal, no murmur, click, rub or gallop Extremities: extremities normal, atraumatic, no cyanosis or edema  EKG not performed today  ASSESSMENT AND PLAN:   ST elevation myocardial infarction involving left circumflex coronary artery [facility]) [name] returns if  her post hospital follow-up. He had a lateral STEMI 02/22/15 treated with direct intervention by myself a drug-eluting stent in his proximal circumflex and first obtuse marginal branch. He had residual high-grade disease in his mid nondominant RCA and moderately severe disease in his proximal LAD. His EF was in the 35-40% range. He complains of being "dizzy" and short of breath. His blood pressure is relatively low today and his heart rate is in the mid 90s. I'm going to discontinue his lisinopril hydrochlorothiazide at current dosing, changed lisinopril 5 mg daily, Hydrocort thiazide 12.5 mg daily. I'm going to change his metoprolol from 50 mg by mouth twice a day to carvedilol 12.5 mg by mouth twice a day. I will see him back in 3 weeks. If he is clinically improved at that time we will recheck a 2-D echo and a Myoview stress test to assess the physiologic significance of his residual coronary disease.  Hypertension History of hypertension blood pressure measured 19/60. He does feel "dizzy". He is on lisinopril hydrochlorothiazide 20/25 as well as metoprolol. I'm going to change his lisinopril from 20 mg to 5 mg and his hydrochlorothiazide from 25-12.5 mg. I'm also going to discontinue his metoprolol and change to carvedilol 12.5 mg by mouth twice a day.  Hyperlipidemia History of hyperlipidemia on atorvastatin 80 mg a day. His lipid profile performed 02/22/15 revealed a total cholesterol 80, LDL 48 and HDL of 20. He was apparently on a statin drug prior to admission.      Curtis Harp MD FACP,FACC,FAHA, [facility] 03/07/2015 10:49 AM

## 2015-03-07 NOTE — Assessment & Plan Note (Signed)
[name] returns if her post hospital follow-up. He had a lateral STEMI 02/22/15 treated with direct intervention by myself a drug-eluting stent in his proximal circumflex and first obtuse marginal branch. He had residual high-grade disease in his mid nondominant RCA and moderately severe disease in his proximal LAD. His EF was in the 35-40% range. He complains of being "dizzy" and short of breath. His blood pressure is relatively low today and his heart rate is in the mid 90s. I'm going to discontinue his lisinopril hydrochlorothiazide at current dosing, changed lisinopril 5 mg daily, Hydrocort thiazide 12.5 mg daily. I'm going to change his metoprolol from 50 mg by mouth twice a day to carvedilol 12.5 mg by mouth twice a day. I will see him back in 3 weeks. If he is clinically improved at that time we will recheck a 2-D echo and a Myoview stress test to assess the physiologic significance of his residual coronary disease.

## 2015-03-07 NOTE — Patient Instructions (Signed)
Medication Instructions:  Your physician has recommended you make the following change in your medication:  1) STOP Lisinopril-Hydrocholorthiazide 2) STOP Metoprolol   3) START Lisinopril 5 mg by mouth ONCE DAILY 4) START Hydrochlorothiazide 12.5 mg by mouth ONCE DAILY 5) START Coreg 12.5 mg by mouth TWICE daily   Labwork: none  Testing/Procedures: none  Follow-Up: Your physician recommends that you schedule a follow-up appointment in: 3 weeks with Dr. [name].   Any Other Special Instructions Will Be Listed Below (If Applicable).     If you need a refill on your cardiac medications before your next appointment, please call your pharmacy.

## 2015-03-07 NOTE — Assessment & Plan Note (Signed)
History of hyperlipidemia on atorvastatin 80 mg a day. His lipid profile performed 02/22/15 revealed a total cholesterol 80, LDL 48 and HDL of 20. He was apparently on a statin drug prior to admission.

## 2015-03-07 NOTE — Assessment & Plan Note (Signed)
History of hypertension blood pressure measured 19/60. He does feel "dizzy". He is on lisinopril hydrochlorothiazide 20/25 as well as metoprolol. I'm going to change his lisinopril from 20 mg to 5 mg and his hydrochlorothiazide from 25-12.5 mg. I'm also going to discontinue his metoprolol and change to carvedilol 12.5 mg by mouth twice a day.

## 2015-03-30 DIAGNOSIS — J209 Acute bronchitis, unspecified: Secondary | ICD-10-CM | POA: Diagnosis not present

## 2015-04-05 ENCOUNTER — Telehealth: Payer: Self-pay | Admitting: *Deleted

## 2015-04-05 ENCOUNTER — Encounter: Payer: Self-pay | Admitting: Cardiovascular Disease

## 2015-04-05 ENCOUNTER — Ambulatory Visit (INDEPENDENT_AMBULATORY_CARE_PROVIDER_SITE_OTHER): Payer: Medicare Other | Admitting: Cardiovascular Disease

## 2015-04-05 VITALS — BP 98/66 | HR 118 | Ht 67.0 in | Wt 172.3 lb

## 2015-04-05 DIAGNOSIS — E785 Hyperlipidemia, unspecified: Secondary | ICD-10-CM

## 2015-04-05 DIAGNOSIS — I519 Heart disease, unspecified: Secondary | ICD-10-CM

## 2015-04-05 DIAGNOSIS — I2121 ST elevation (STEMI) myocardial infarction involving left circumflex coronary artery: Secondary | ICD-10-CM

## 2015-04-05 LAB — BASIC METABOLIC PANEL
BUN: 7 mg/dL (ref 7–25)
CHLORIDE: 100 mmol/L (ref 98–110)
CO2: 24 mmol/L (ref 20–31)
CREATININE: 0.6 mg/dL — AB (ref 0.70–1.25)
Calcium: 9.2 mg/dL (ref 8.6–10.3)
Glucose, Bld: 90 mg/dL (ref 65–99)
Potassium: 3.9 mmol/L (ref 3.5–5.3)
Sodium: 134 mmol/L — ABNORMAL LOW (ref 135–146)

## 2015-04-05 MED ORDER — CARVEDILOL 12.5 MG PO TABS: 18.7500 mg | ORAL_TABLET | Freq: Two times a day (BID) | ORAL | Status: DC

## 2015-04-05 NOTE — Telephone Encounter (Signed)
Patients wife, [name] called and wanted to know if the patient should continue to take the lisinopril and hydrochlorothiazide. If he is, she requested that the prescriptions be sent to [facility]. She can be reached at [phone].

## 2015-04-05 NOTE — Patient Instructions (Signed)
Medication Instructions:  Your physician has recommended you make the following change in your medication:  1) INCREASE Coreg to 18.75 mg (one and half tablets) by mouth TWICE daily   Labwork: Your physician recommends that you return for lab work: TODAY (BMET/BNP) The lab can be found on the FIRST FLOOR of out building in [address]   Testing/Procedures: Your physician has requested that you have an echocardiogram. Echocardiography is a painless test that uses sound waves to create images of your heart. It provides your doctor with information about the size and shape of your heart and how well your heart's chambers and valves are working. This procedure takes approximately one hour. There are no restrictions for this procedure. SCHEDULE BEFORE F/U APPTS    Follow-Up: We request that you follow-up in: 2 weeks with an extender and in 4 weeks with Dr Andria Rhein will receive a reminder letter in the mail two months in advance. If you don't receive a letter, please call our office to schedule the follow-up appointment.    Any Other Special Instructions Will Be Listed Below (If Applicable).  REMINDER: call us with needs for Rx refills with bottle in front of you  If you need a refill on your cardiac medications before your next appointment, please call your pharmacy.

## 2015-04-05 NOTE — Assessment & Plan Note (Signed)
Curtis Flores returns today for follow-up of ischemic cardiac myopathy, systolic heart failure and recent circumflex stenting with residual CAD. When I saw him approximately 3 weeks ago I changed his metoprolol to carvedilol. His ejection fraction post intervention was in the 35-40% range on 02/23/15. He continues to have class III symptoms. His blood pressure is low and he is on appropriate medications. I'm going to get some routine labs on him this morning, slowly up titrate his carvedilol from 12.5-18.75 mg twice a day. He will see mid-level provider back in 2 weeks and me back in one month at which time we will get a 2-D echo. His EKG today shows sinus tachycardia at 119.

## 2015-04-05 NOTE — Progress Notes (Signed)
04/05/2015 [name]   Oct 04, 1955  [id number]  Primary Physician [name], MD Primary Cardiologist: Lorretta Harp MD Renae Gloss   HPI:   [name] is a 60 year old thin-appearing married African-American male father of 2, grandfather to 3 grandchildren accompanied by his wife today. His primary care physician is Dr. Edrick Oh in medicine. I recently saw him in the office 03/07/15. He has a remote history of tobacco abuse as well as a history of hypertension and hyperlipidemia rheumatoid arthritis. He had an inferolateral STEMI on 02/22/15. I took him to the Cath Lab and stented his proximal dominant circumflex and first obtuse marginal branch with drug-eluting stents (T stenting). He had residual 90% stenosis and a mid nondominant RCA 80% proximal LAD stenosis. His EF was 35-40% by 2-D echo. . There is a history of pulmonary fibrosis however as well. When I saw him in the office 2 weeks ago I changed his metoprolol to carvedilol. His blood pressures were soft and he continues to have severe dyspnea on exertion but denies chest pain. His heart rate today is 119 with sinus tachycardia.   Current Outpatient Prescriptions  Medication Sig Dispense Refill  . Apremilast (OTEZLA) 30 MG TABS Take 1 tablet by mouth 2 (two) times daily.    [name] aspirin 81 MG chewable tablet Chew 1 tablet (81 mg total) by mouth daily.    [name] atorvastatin (LIPITOR) 80 MG tablet Take 1 tablet (80 mg total) by mouth daily at 6 PM. 30 tablet 6  . carvedilol (COREG) 12.5 MG tablet Take 1.5 tablets (18.75 mg total) by mouth 2 (two) times daily. 60 tablet 3  . guaiFENesin (MUCINEX) 600 MG 12 hr tablet Take 600 mg by mouth 2 (two) times daily.    . hydrochlorothiazide (MICROZIDE) 12.5 MG capsule Take 1 capsule (12.5 mg total) by mouth daily. 30 capsule 0  . leflunomide (ARAVA) 20 MG tablet Take 20 mg by mouth daily.     [name] levofloxacin (LEVAQUIN) 500 MG tablet Take 1 tablet by mouth daily.    [name] lisinopril  (PRINIVIL,ZESTRIL) 5 MG tablet Take 1 tablet (5 mg total) by mouth daily. 30 tablet 0  . pantoprazole (PROTONIX) 40 MG tablet Take 1 tablet (40 mg total) by mouth daily. 30 tablet 11  . ticagrelor (BRILINTA) 90 MG TABS tablet Take 1 tablet (90 mg total) by mouth 2 (two) times daily. 60 tablet 0   No current facility-administered medications for this visit.    No Known Allergies  Social History   Social History  . Marital Status: Married    Spouse Name: N/A  . Number of Children: 2  . Years of Education: N/A   Occupational History  . unemployed     not working do to arthritis; used to be Patent attorney for a [employer] History Main Topics  . Smoking status: Former Smoker -- 1.00 packs/day for 30 years    Types: Cigarettes    Quit date: 03/21/2003  . Smokeless tobacco: Never Used  . Alcohol Use: No     Comment: H/O case of beer weekly x 20 years, quiting in 2000-ish.  . Drug Use: No     Comment: H/O marijuana use many years ago.  [name] Sexual Activity: Yes    Birth Control/ Protection: None   Other Topics Concern  . Not on file   Social History Narrative     Review of Systems: General: negative for chills, fever, night sweats or  weight changes.  Cardiovascular: negative for chest pain, dyspnea on exertion, edema, orthopnea, palpitations, paroxysmal nocturnal dyspnea or shortness of breath Dermatological: negative for rash Respiratory: negative for cough or wheezing Urologic: negative for hematuria Abdominal: negative for nausea, vomiting, diarrhea, bright red blood per rectum, melena, or hematemesis Neurologic: negative for visual changes, syncope, or dizziness All other systems reviewed and are otherwise negative except as noted above.    Blood pressure 98/66, pulse 118, height 5\' 7"  (1.702 m), weight 172 lb 4.8 oz (78.155 kg), SpO2 98 %.  General appearance: alert and no distress Neck: no adenopathy, no carotid bruit, no JVD, supple, symmetrical,  trachea midline and thyroid not enlarged, symmetric, no tenderness/mass/nodules Lungs: clear to auscultation bilaterally Heart: sinus tachycardia with a summation gallop Extremities: extremities normal, atraumatic, no cyanosis or edema  EKG sinus tachycardia 119 with a nonspecific IVCD. I personally reviewed this EKG  ASSESSMENT AND PLAN:   ST elevation myocardial infarction involving left circumflex coronary artery [facility]) Mr. Para returns today for follow-up of ischemic cardiac myopathy, systolic heart failure and recent circumflex stenting with residual CAD. When I saw him approximately 3 weeks ago I changed his metoprolol to carvedilol. His ejection fraction post intervention was in the 35-40% range on 02/23/15. He continues to have class III symptoms. His blood pressure is low and he is on appropriate medications. I'm going to get some routine labs on him this morning, slowly up titrate his carvedilol from 12.5-18.75 mg twice a day. He will see mid-level provider back in 2 weeks and me back in one month at which time we will get a 2-D echo. His EKG today shows sinus tachycardia at 119.      Lorretta Harp MD FACP,FACC,FAHA, [facility] 04/05/2015 10:12 AM

## 2015-04-06 LAB — BRAIN NATRIURETIC PEPTIDE: Brain Natriuretic Peptide: 337.3 pg/mL — ABNORMAL HIGH (ref 0.0–100.0)

## 2015-04-07 ENCOUNTER — Inpatient Hospital Stay (HOSPITAL_COMMUNITY)
Admission: EM | Admit: 2015-04-07 | Discharge: 2015-04-15 | DRG: 308 | Disposition: A | Discharge status: Home / Self Care | Payer: Medicare Other | Attending: Cardiovascular Disease | Admitting: Cardiovascular Disease

## 2015-04-07 ENCOUNTER — Emergency Department (HOSPITAL_COMMUNITY): Payer: Medicare Other

## 2015-04-07 ENCOUNTER — Other Ambulatory Visit: Payer: Self-pay | Admitting: Cardiovascular Disease

## 2015-04-07 ENCOUNTER — Encounter (HOSPITAL_COMMUNITY): Payer: Self-pay

## 2015-04-07 DIAGNOSIS — I4892 Unspecified atrial flutter: Secondary | ICD-10-CM | POA: Insufficient documentation

## 2015-04-07 DIAGNOSIS — Z7902 Long term (current) use of antithrombotics/antiplatelets: Secondary | ICD-10-CM | POA: Diagnosis not present

## 2015-04-07 DIAGNOSIS — Z87891 Personal history of nicotine dependence: Secondary | ICD-10-CM

## 2015-04-07 DIAGNOSIS — M069 Rheumatoid arthritis, unspecified: Secondary | ICD-10-CM | POA: Diagnosis present

## 2015-04-07 DIAGNOSIS — I081 Rheumatic disorders of both mitral and tricuspid valves: Secondary | ICD-10-CM | POA: Diagnosis present

## 2015-04-07 DIAGNOSIS — I4891 Unspecified atrial fibrillation: Secondary | ICD-10-CM | POA: Diagnosis present

## 2015-04-07 DIAGNOSIS — E785 Hyperlipidemia, unspecified: Secondary | ICD-10-CM | POA: Diagnosis present

## 2015-04-07 DIAGNOSIS — I255 Ischemic cardiomyopathy: Secondary | ICD-10-CM | POA: Diagnosis present

## 2015-04-07 DIAGNOSIS — R40241 Glasgow coma scale score 13-15, unspecified time: Secondary | ICD-10-CM | POA: Diagnosis present

## 2015-04-07 DIAGNOSIS — I483 Typical atrial flutter: Secondary | ICD-10-CM | POA: Diagnosis not present

## 2015-04-07 DIAGNOSIS — I2510 Atherosclerotic heart disease of native coronary artery without angina pectoris: Secondary | ICD-10-CM | POA: Diagnosis not present

## 2015-04-07 DIAGNOSIS — R000 Tachycardia, unspecified: Secondary | ICD-10-CM | POA: Diagnosis not present

## 2015-04-07 DIAGNOSIS — I10 Essential (primary) hypertension: Secondary | ICD-10-CM | POA: Diagnosis not present

## 2015-04-07 DIAGNOSIS — I472 Ventricular tachycardia: Secondary | ICD-10-CM

## 2015-04-07 DIAGNOSIS — I110 Hypertensive heart disease with heart failure: Secondary | ICD-10-CM | POA: Diagnosis present

## 2015-04-07 DIAGNOSIS — I5023 Acute on chronic systolic (congestive) heart failure: Secondary | ICD-10-CM | POA: Diagnosis not present

## 2015-04-07 DIAGNOSIS — L4050 Arthropathic psoriasis, unspecified: Secondary | ICD-10-CM | POA: Diagnosis present

## 2015-04-07 DIAGNOSIS — Z7982 Long term (current) use of aspirin: Secondary | ICD-10-CM

## 2015-04-07 DIAGNOSIS — J8410 Pulmonary fibrosis, unspecified: Secondary | ICD-10-CM | POA: Diagnosis not present

## 2015-04-07 DIAGNOSIS — Z9861 Coronary angioplasty status: Secondary | ICD-10-CM

## 2015-04-07 DIAGNOSIS — I447 Left bundle-branch block, unspecified: Secondary | ICD-10-CM | POA: Diagnosis not present

## 2015-04-07 DIAGNOSIS — K219 Gastro-esophageal reflux disease without esophagitis: Secondary | ICD-10-CM | POA: Diagnosis present

## 2015-04-07 DIAGNOSIS — R0602 Shortness of breath: Secondary | ICD-10-CM | POA: Diagnosis not present

## 2015-04-07 DIAGNOSIS — Z955 Presence of coronary angioplasty implant and graft: Secondary | ICD-10-CM

## 2015-04-07 DIAGNOSIS — Z79899 Other long term (current) drug therapy: Secondary | ICD-10-CM | POA: Diagnosis not present

## 2015-04-07 DIAGNOSIS — E876 Hypokalemia: Secondary | ICD-10-CM | POA: Diagnosis present

## 2015-04-07 DIAGNOSIS — I252 Old myocardial infarction: Secondary | ICD-10-CM | POA: Diagnosis not present

## 2015-04-07 DIAGNOSIS — R0682 Tachypnea, not elsewhere classified: Secondary | ICD-10-CM | POA: Diagnosis not present

## 2015-04-07 HISTORY — DX: Typical atrial flutter: I48.3

## 2015-04-07 HISTORY — DX: Heart disease, unspecified: I51.9

## 2015-04-07 HISTORY — DX: Ischemic cardiomyopathy: I25.5

## 2015-04-07 HISTORY — DX: Left bundle-branch block, unspecified: I44.7

## 2015-04-07 LAB — BASIC METABOLIC PANEL
Anion gap: 14 (ref 5–15)
BUN: 10 mg/dL (ref 6–20)
CALCIUM: 9 mg/dL (ref 8.9–10.3)
CO2: 23 mmol/L (ref 22–32)
CREATININE: 0.8 mg/dL (ref 0.61–1.24)
Chloride: 103 mmol/L (ref 101–111)
GFR calc Af Amer: >60 mL/min (ref >60)
GFR calc non Af Amer: >60 mL/min (ref >60)
GLUCOSE: 124 mg/dL — AB (ref 65–99)
Potassium: 3.8 mmol/L (ref 3.5–5.1)
Sodium: 140 mmol/L (ref 135–145)

## 2015-04-07 LAB — CBC
HEMATOCRIT: 36.7 % — AB (ref 39.0–52.0)
HEMOGLOBIN: 11.8 g/dL — AB (ref 13.0–17.0)
MCH: 25 pg — AB (ref 26.0–34.0)
MCHC: 32.2 g/dL (ref 30.0–36.0)
MCV: 77.8 fL — AB (ref 78.0–100.0)
Platelets: 333 10*3/uL (ref 150–400)
RBC: 4.72 MIL/uL (ref 4.22–5.81)
RDW: 16.2 % — ABNORMAL HIGH (ref 11.5–15.5)
WBC: 14.3 10*3/uL — ABNORMAL HIGH (ref 4.0–10.5)

## 2015-04-07 LAB — I-STAT CHEM 8, ED
BUN: 10 mg/dL (ref 6–20)
CALCIUM ION: 0.98 mmol/L — AB (ref 1.13–1.30)
CREATININE: 0.8 mg/dL (ref 0.61–1.24)
Chloride: 102 mmol/L (ref 101–111)
GLUCOSE: 120 mg/dL — AB (ref 65–99)
HCT: 44 % (ref 39.0–52.0)
Hemoglobin: 15 g/dL (ref 13.0–17.0)
Potassium: 3.6 mmol/L (ref 3.5–5.1)
Sodium: 140 mmol/L (ref 135–145)
TCO2: 25 mmol/L (ref 0–100)

## 2015-04-07 LAB — BRAIN NATRIURETIC PEPTIDE: B Natriuretic Peptide: 339 pg/mL — ABNORMAL HIGH (ref 0.0–100.0)

## 2015-04-07 LAB — I-STAT TROPONIN, ED
TROPONIN I, POC: 0.02 ng/mL (ref 0.00–0.08)
Troponin i, poc: 0.01 ng/mL (ref 0.00–0.08)

## 2015-04-07 LAB — PHOSPHORUS: PHOSPHORUS: 3.2 mg/dL (ref 2.5–4.6)

## 2015-04-07 LAB — MAGNESIUM: Magnesium: 1.7 mg/dL (ref 1.7–2.4)

## 2015-04-07 LAB — APTT: aPTT: 31 seconds (ref 24–37)

## 2015-04-07 MED ORDER — AMIODARONE LOAD VIA INFUSION
150.0000 mg | Freq: Once | INTRAVENOUS | Status: AC
  Administered 2015-04-07: 150 mg via INTRAVENOUS
  Filled 2015-04-07: qty 83.34

## 2015-04-07 MED ORDER — PANTOPRAZOLE SODIUM 40 MG PO TBEC
40.0000 mg | DELAYED_RELEASE_TABLET | Freq: Every day | ORAL | Status: DC
  Administered 2015-04-08 – 2015-04-15 (×8): 40 mg via ORAL
  Filled 2015-04-07 (×8): qty 1

## 2015-04-07 MED ORDER — PANTOPRAZOLE SODIUM 40 MG PO TBEC: 40.0000 mg | DELAYED_RELEASE_TABLET | Freq: Every day | ORAL | Status: DC

## 2015-04-07 MED ORDER — TICAGRELOR 90 MG PO TABS
90.0000 mg | ORAL_TABLET | Freq: Two times a day (BID) | ORAL | Status: DC
  Administered 2015-04-07 – 2015-04-10 (×6): 90 mg via ORAL
  Filled 2015-04-07 (×6): qty 1

## 2015-04-07 MED ORDER — HEPARIN (PORCINE) IN NACL 100-0.45 UNIT/ML-% IJ SOLN
1850.0000 [IU]/h | INTRAMUSCULAR | Status: DC
  Administered 2015-04-07: 1000 [IU]/h via INTRAVENOUS
  Administered 2015-04-09 – 2015-04-12 (×7): 1750 [IU]/h via INTRAVENOUS
  Administered 2015-04-13: 1850 [IU]/h via INTRAVENOUS
  Filled 2015-04-07 (×11): qty 250

## 2015-04-07 MED ORDER — ASPIRIN 81 MG PO CHEW
81.0000 mg | CHEWABLE_TABLET | Freq: Every day | ORAL | Status: DC
  Administered 2015-04-08 – 2015-04-14 (×7): 81 mg via ORAL
  Filled 2015-04-07 (×8): qty 1

## 2015-04-07 MED ORDER — HEPARIN BOLUS VIA INFUSION
4000.0000 [IU] | Freq: Once | INTRAVENOUS | Status: AC
  Administered 2015-04-07: 4000 [IU] via INTRAVENOUS
  Filled 2015-04-07: qty 4000

## 2015-04-07 MED ORDER — AMIODARONE HCL IN DEXTROSE 360-4.14 MG/200ML-% IV SOLN
30.0000 mg/h | INTRAVENOUS | Status: DC
  Administered 2015-04-08 – 2015-04-10 (×6): 30 mg/h via INTRAVENOUS
  Filled 2015-04-07 (×5): qty 200

## 2015-04-07 MED ORDER — ASPIRIN 81 MG PO CHEW
324.0000 mg | CHEWABLE_TABLET | Freq: Once | ORAL | Status: DC
  Filled 2015-04-07: qty 4

## 2015-04-07 MED ORDER — ONDANSETRON HCL 4 MG/2ML IJ SOLN
4.0000 mg | Freq: Four times a day (QID) | INTRAMUSCULAR | Status: DC | PRN
  Administered 2015-04-09: 4 mg via INTRAVENOUS
  Filled 2015-04-07: qty 2

## 2015-04-07 MED ORDER — LISINOPRIL 5 MG PO TABS
5.0000 mg | ORAL_TABLET | Freq: Every day | ORAL | Status: DC
  Administered 2015-04-08 – 2015-04-15 (×7): 5 mg via ORAL
  Filled 2015-04-07 (×8): qty 1

## 2015-04-07 MED ORDER — ATORVASTATIN CALCIUM 80 MG PO TABS
80.0000 mg | ORAL_TABLET | Freq: Every day | ORAL | Status: DC
  Administered 2015-04-08 – 2015-04-14 (×7): 80 mg via ORAL
  Filled 2015-04-07 (×7): qty 1

## 2015-04-07 MED ORDER — LEFLUNOMIDE 20 MG PO TABS
20.0000 mg | ORAL_TABLET | Freq: Every day | ORAL | Status: DC
  Administered 2015-04-08 – 2015-04-15 (×8): 20 mg via ORAL
  Filled 2015-04-07 (×9): qty 1

## 2015-04-07 MED ORDER — AMIODARONE HCL IN DEXTROSE 360-4.14 MG/200ML-% IV SOLN
60.0000 mg/h | INTRAVENOUS | Status: DC
  Administered 2015-04-07 (×2): 60 mg/h via INTRAVENOUS
  Filled 2015-04-07 (×2): qty 200

## 2015-04-07 MED ORDER — APREMILAST 30 MG PO TABS
1.0000 | ORAL_TABLET | Freq: Two times a day (BID) | ORAL | Status: DC
  Administered 2015-04-08 – 2015-04-15 (×14): 1 via ORAL
  Filled 2015-04-07 (×5): qty 1

## 2015-04-07 MED ORDER — FUROSEMIDE 10 MG/ML IJ SOLN
40.0000 mg | Freq: Two times a day (BID) | INTRAMUSCULAR | Status: DC
  Administered 2015-04-07 – 2015-04-09 (×4): 40 mg via INTRAVENOUS
  Filled 2015-04-07 (×7): qty 4

## 2015-04-07 MED ORDER — ACETAMINOPHEN 325 MG PO TABS: 650.0000 mg | ORAL_TABLET | ORAL | Status: DC | PRN

## 2015-04-07 MED ORDER — CARVEDILOL 12.5 MG PO TABS
12.5000 mg | ORAL_TABLET | Freq: Two times a day (BID) | ORAL | Status: DC
  Administered 2015-04-08 – 2015-04-15 (×15): 12.5 mg via ORAL
  Filled 2015-04-07 (×15): qty 1

## 2015-04-07 NOTE — Progress Notes (Signed)
This encounter was created in error - please disregard.

## 2015-04-07 NOTE — ED Notes (Signed)
Cardiology MD at bedside.

## 2015-04-07 NOTE — H&P (Signed)
History and Physical   Admit date: 04/07/2015 Name:  Curtis Flores Medical record number: [id number] DOB/Age:  03-22-1955  60 y.o. male  Referring Physician:  [facility]  Primary Cardiologist: [name]  Primary Physician:   Edrick Oh   Chief complaint/reason for admission: Shortness of breath  HPI:  This 60 year old black male had an acute lateral wall infarction on December 21.  He had three-vessel disease and underwent T stenting of the rocks will circumflex and first marginal branch by Dr. Alvester Chou.  His ejection fraction was around 35-40% on discharge.  He has been dizzy and short of breath since the procedure and has been very weak.  He has found it difficult to walk and had increasing dyspnea.  He was seen by his primary doctor for bronchitis with increasing shortness of breath cough and was treated with Levaquin.  He saw Dr. Alvester Chou 2 days ago and had a loud gallop noted and his heart rate was noted to be 120.  It was thought at that time to be sinus tachycardia but in retrospect was probably atrial flutter.  He was at the barber shop today and had the onset of severe dyspnea after getting out of the chair and the ambulance was called.  He was found to be in a wide-complex tachycardia at a very rapid rate of over 200 and evidently was given a single bolus of adenosine and then a repeat bolus as well as diltiazem and his heart rate slowed to 120.  On arrival here he has had a continued heart rate of 120 mild elevation of BNP.  He continues to be significantly short of breath but has not had ischemic chest pain.  He simply feels bad.  He has not had syncope.  He has had a mild amount of edema.     Past Medical History  Diagnosis Date  . Psoriasis 2003  . Rheumatoid arthritis(714.0) 2012  . Psoriatic arthritis ([place])   . Leukocytosis   . Lymphadenopathy   . Pulmonary fibrosis ([place])   . Hypertension   . GERD (gastroesophageal reflux disease)   . Hyperlipidemia   . Coronary artery  disease     lateral STEMI 02/22/15     Past Surgical History  Procedure Laterality Date  . Cardiac catheterization N/A 02/22/2015    Procedure: Left Heart Cath and Coronary Angiography;  Surgeon: Lorretta Harp, MD;  Location: [place] CV [facility];  Service: Cardiovascular;  Laterality: N/A;  . Cardiac catheterization N/A 02/22/2015    Procedure: Coronary Stent Intervention;  Surgeon: Lorretta Harp, MD;  Location: [place] CV [facility];  Service: Cardiovascular;  Laterality: N/A;   Allergies: has No Known Allergies.   Medications: Prior to Admission medications   Medication Sig Start Date End Date Taking? Authorizing Provider  Apremilast (OTEZLA) 30 MG TABS Take 1 tablet by mouth 2 (two) times daily.   Yes Historical Provider, MD  aspirin 81 MG chewable tablet Chew 1 tablet (81 mg total) by mouth daily. 02/25/15  Yes Bhavinkumar Bhagat, PA  atorvastatin (LIPITOR) 80 MG tablet Take 1 tablet (80 mg total) by mouth daily at 6 PM. 02/25/15  Yes Bhavinkumar Bhagat, PA  carvedilol (COREG) 12.5 MG tablet Take 1.5 tablets (18.75 mg total) by mouth 2 (two) times daily. 04/05/15  Yes Lorretta Harp, MD  esomeprazole (NEXIUM) 40 MG capsule Take 40 mg by mouth daily at 12 noon.   Yes Historical Provider, MD  guaiFENesin (MUCINEX) 600 MG 12 hr tablet Take 600  mg by mouth 2 (two) times daily.   Yes Historical Provider, MD  hydrochlorothiazide (MICROZIDE) 12.5 MG capsule TAKE 1 CAPSULE (12.5 MG TOTAL) BY MOUTH DAILY. 04/07/15  Yes Lorretta Harp, MD  leflunomide (ARAVA) 20 MG tablet Take 20 mg by mouth daily.  10/04/14  Yes Historical Provider, MD  levofloxacin (LEVAQUIN) 500 MG tablet Take 1 tablet by mouth daily. 03/30/15 04/09/15 Yes Historical Provider, MD  lisinopril (PRINIVIL,ZESTRIL) 5 MG tablet TAKE 1 TABLET (5 MG TOTAL) BY MOUTH DAILY. 04/07/15  Yes Lorretta Harp, MD  pantoprazole (PROTONIX) 40 MG tablet Take 1 tablet (40 mg total) by mouth daily. 02/25/15  Yes Bhavinkumar Bhagat, PA  ticagrelor  (BRILINTA) 90 MG TABS tablet Take 1 tablet (90 mg total) by mouth 2 (two) times daily. 03/07/15  Yes Lorretta Harp, MD    Family History:  Family Status  Relation Status Death Age  . Mother Deceased   . Father Deceased   . Brother Deceased     Social History:   reports that he quit smoking about 12 years ago. His smoking use included Cigarettes. He has a 30 pack-year smoking history. He has never used smokeless tobacco. He reports that he does not drink alcohol or use illicit drugs.   Social History   Social History Narrative     Review of Systems:  Significant shortness of breath as well as some PND since he has been home.  No definite anginal type chest pain.  He does have some orthopnea.  Has mild edema.  Recently had bronchitis and was treated with a course of Levaquin by his primary doctor.  He has had cough with productive sputum.  He has some nocturia.  He has been severely weak since he has been home.  He has had significant dyspnea since he went home. Other than as noted above, the remainder of the review of systems is normal  Physical Exam: BP 146/91 mmHg  Pulse 119  Temp(Src) 98.4 F (36.9 C) (Oral)  Resp 30  Ht 5\' 7"  (1.702 m)  Wt 78.019 kg (172 lb)  BMI 26.93 kg/m2  SpO2 100% General appearance: He is a pleasant black male who is in mild respiratory distress Head: Normocephalic, without obvious abnormality, atraumatic, Balding male hair pattern Eyes: conjunctivae/corneas clear. PERRL, EOM's intact. Fundi not examined  Neck: Neck veins are difficult to assess, no definite bruits but difficult to hear  Lungs: Increased AP diameter, Velcro basilar crackles present Heart: Rapid regular rhythm with a loud summation gallop noted Abdomen: soft, non-tender; bowel sounds normal; no masses,  no organomegaly Rectal: deferred Extremities: Normal range of motion, no deformity, trace edema noted Pulses: Distal pulses are 1+ Skin: Skin color, texture, turgor normal. No  rashes or lesions Neurologic: Grossly normal  Labs: CBC  Recent Labs  04/07/15 1754 04/07/15 1814  WBC 14.3*  --   RBC 4.72  --   HGB 11.8* 15.0  HCT 36.7* 44.0  PLT 333  --   MCV 77.8*  --   MCH 25.0*  --   MCHC 32.2  --   RDW 16.2*  --    CMP   Recent Labs  04/07/15 1754 04/07/15 1814  NA 140 140  K 3.8 3.6  CL 103 102  CO2 23  --   GLUCOSE 124* 120*  BUN 10 10  CREATININE 0.80 0.80  CALCIUM 9.0  --   GFRNONAA >60  --   GFRAA >60  --    BNP (last  3 results)    Component Value Date/Time   BNP 339.0* 04/07/2015 1754   Cardiac Panel (last 3 results) Troponin (Point of Care Test)  Recent Labs  04/07/15 2008  TROPIPOC 0.02   BNP    Component Value Date/Time   BNP 339.0* 04/07/2015 1754     EKG: Appears to be in atrial flutter with 2-1 block and a left bundle branch block.  Was in atrial flutter on 21 when seen in office.  Review of rhythm strips show wide complex tachycardia initially at a very rapid rate of greater than 200 after adenosine converting to atrial flutter.    Radiology: Pulmonary fibrosis, Cardiomegaly, basal interstitial densities    IMPRESSIONS: 1.  Wide-complex tachycardia.  The patient currently has atrial flutter with 2:1 block.  Unclear whether the wide-complex tachycardia was rapid atrial flutter or could've been ventricular tachycardia.  Has recently been on quinolone antibiotics so could've been ventricular tachycardia also..  Review of EKG from the office shows that he was in atrial flutter with 2:1 block when seen 2 days ago. 2.  Acute on chronic systolic congestive heart failure with S3 gallop 3.  Coronary artery disease with previous stenting of the circumflex recently and residual stenosis in the LAD and right coronary artery 4.  Hypertensive heart disease 5.  Hyperlipidemia 6.  Pulmonary fibrosis with basilar crackles making it difficult to tell if this is heart failure or pulmonary fibrosis 7.  History of psoriasis 8.   Rheumatoid arthritis 9.  Left bundle branch block 10.  Dyspnea.  He also is on BRILINTA but has an S3 gallop area will need to look again at his ventricular function but I think restoration to sinus rhythm will be very important overall to help him particularly with the rapid rate.  PLAN:  He currently is in atrial flutter with a ventricular response of 120.  He will be anticoagulated with heparin.  Because of his low ejection fraction and S3 gallop I am hesitant to use diltiazem.  Will initiate amiodarone bolus and amiodarone infusion in an effort to try to get better rate control.  May also need some digoxin if his blood pressure is soft.  Alternately could change him back to metoprolol from carvedilol.  He has basilar crackles and we'll try intravenous diuresis but will need to watch blood pressure.  Watch renal function.  Repeat echocardiogram.    Signed: Kerry Hough MD [facility] Cardiology  04/07/2015, 8:29 PM

## 2015-04-07 NOTE — ED Notes (Signed)
Pt. Went to the barber shop became sob . Went home around 15:00 called 911.  Pt was having sob and HR  At 240.    Pt. Received,  Adenosine 6mg  and then 12 mg IV.  Pt. Also received 20 Mg IV of Cardizem, pt.  Then converted to ST at 120.  Pt. Denies any chest pain .  Pt. Is alert and oriented X4  Skin is warm and dry

## 2015-04-07 NOTE — ED Provider Notes (Signed)
CSN: [id number]     Arrival date & time 04/07/15  1641 History   First MD Initiated Contact with Patient 04/07/15 1659     Chief Complaint  Patient presents with  . Irregular Heart Beat     (Consider location/radiation/quality/duration/timing/severity/associated sxs/prior Treatment) Patient is a 60 y.o. male presenting with shortness of breath. The history is provided by the patient.  Shortness of Breath Severity:  Moderate Onset quality:  Sudden Duration: started around 3pm. Timing:  Constant Progression:  Partially resolved Chronicity:  New Context comment:  Recent mi with stent Relieved by: adenosine with ems. Worsened by:  Nothing tried Ineffective treatments:  None tried Associated symptoms: no abdominal pain, no chest pain, no cough, no fever, no sputum production and no syncope     Past Medical History  Diagnosis Date  . Psoriasis 2003  . Rheumatoid arthritis(714.0) 2012  . Psoriatic arthritis ([place])   . Leukocytosis   . Lymphadenopathy   . Pulmonary fibrosis ([place])   . Hypertension   . GERD (gastroesophageal reflux disease)   . Hyperlipidemia   . Coronary artery disease     lateral STEMI 02/22/15   Past Surgical History  Procedure Laterality Date  . Cardiac catheterization N/A 02/22/2015    Procedure: Left Heart Cath and Coronary Angiography;  Surgeon: Lorretta Harp, MD;  Location: [place] CV LAB;  Service: Cardiovascular;  Laterality: N/A;  . Cardiac catheterization N/A 02/22/2015    Procedure: Coronary Stent Intervention;  Surgeon: Lorretta Harp, MD;  Location: [place] [facility];  Service: Cardiovascular;  Laterality: N/A;   Family History  Problem Relation Age of Onset  . Hypertension Mother    Social History  Substance Use Topics  . Smoking status: Former Smoker -- 1.00 packs/day for 30 years    Types: Cigarettes    Quit date: 03/21/2003  . Smokeless tobacco: Never Used  . Alcohol Use: No     Comment: H/O case of beer weekly x 20 years,  quiting in 2000-ish.    Review of Systems  Constitutional: Negative for fever.  HENT: Negative.   Eyes: Negative for visual disturbance.  Respiratory: Positive for shortness of breath. Negative for cough and sputum production.   Cardiovascular: Negative for chest pain and syncope.  Gastrointestinal: Negative for abdominal pain and diarrhea.  Genitourinary: Negative.   Musculoskeletal: Negative.   Skin: Negative.   Neurological: Negative.       Allergies  Review of patient's allergies indicates no known allergies.  Home Medications   Prior to Admission medications   Medication Sig Start Date End Date Taking? Authorizing Provider  Apremilast (OTEZLA) 30 MG TABS Take 1 tablet by mouth 2 (two) times daily.   Yes Historical Provider, MD  aspirin 81 MG chewable tablet Chew 1 tablet (81 mg total) by mouth daily. 02/25/15  Yes Bhavinkumar Bhagat, PA  atorvastatin (LIPITOR) 80 MG tablet Take 1 tablet (80 mg total) by mouth daily at 6 PM. 02/25/15  Yes Bhavinkumar Bhagat, PA  carvedilol (COREG) 12.5 MG tablet Take 1.5 tablets (18.75 mg total) by mouth 2 (two) times daily. 04/05/15  Yes Lorretta Harp, MD  esomeprazole (NEXIUM) 40 MG capsule Take 40 mg by mouth daily at 12 noon.   Yes Historical Provider, MD  guaiFENesin (MUCINEX) 600 MG 12 hr tablet Take 600 mg by mouth 2 (two) times daily.   Yes Historical Provider, MD  hydrochlorothiazide (MICROZIDE) 12.5 MG capsule TAKE 1 CAPSULE (12.5 MG TOTAL) BY MOUTH DAILY. 04/07/15  Yes Roderic Palau  Adora Fridge, MD  leflunomide (ARAVA) 20 MG tablet Take 20 mg by mouth daily.  10/04/14  Yes Historical Provider, MD  levofloxacin (LEVAQUIN) 500 MG tablet Take 1 tablet by mouth daily. 03/30/15 04/09/15 Yes Historical Provider, MD  lisinopril (PRINIVIL,ZESTRIL) 5 MG tablet TAKE 1 TABLET (5 MG TOTAL) BY MOUTH DAILY. 04/07/15  Yes Lorretta Harp, MD  pantoprazole (PROTONIX) 40 MG tablet Take 1 tablet (40 mg total) by mouth daily. 02/25/15  Yes Bhavinkumar Bhagat, PA   ticagrelor (BRILINTA) 90 MG TABS tablet Take 1 tablet (90 mg total) by mouth 2 (two) times daily. 03/07/15  Yes Lorretta Harp, MD   BP 106/70 mmHg  Pulse 113  Temp(Src) 98.1 F (36.7 C) (Oral)  Resp 28  Ht 5\' 7"  (1.702 m)  Wt 76.4 kg  BMI 26.37 kg/m2  SpO2 100% Physical Exam  Constitutional: He is oriented to person, place, and time. He appears well-developed and well-nourished. No distress.  HENT:  Head: Normocephalic and atraumatic.  Eyes: EOM are normal. Pupils are equal, round, and reactive to light. No scleral icterus.  Neck: Normal range of motion. Neck supple.  Cardiovascular: Regular rhythm, normal heart sounds and intact distal pulses.   tachycardic  Pulmonary/Chest: Effort normal and breath sounds normal. No respiratory distress. He exhibits no tenderness.  Abdominal: Soft. He exhibits no distension. There is no tenderness. There is no rebound and no guarding.  Musculoskeletal: Normal range of motion. He exhibits no edema or tenderness.  Neurological: He is alert and oriented to person, place, and time. No cranial nerve deficit. He exhibits normal muscle tone. Coordination normal.  Skin: Skin is warm and dry. No rash noted. He is not diaphoretic. No erythema. No pallor.  Psychiatric: He has a normal mood and affect.  Nursing note and vitals reviewed.   ED Course  Procedures (including critical care time) Labs Review Labs Reviewed  CBC - Abnormal; Notable for the following:    WBC 14.3 (*)    Hemoglobin 11.8 (*)    HCT 36.7 (*)    MCV 77.8 (*)    MCH 25.0 (*)    RDW 16.2 (*)    All other components within normal limits  BASIC METABOLIC PANEL - Abnormal; Notable for the following:    Glucose, Bld 124 (*)    All other components within normal limits  BRAIN NATRIURETIC PEPTIDE - Abnormal; Notable for the following:    B Natriuretic Peptide 339.0 (*)    All other components within normal limits  CBC - Abnormal; Notable for the following:    WBC 11.5 (*)     Hemoglobin 11.5 (*)    HCT 35.5 (*)    MCV 76.5 (*)    MCH 24.8 (*)    RDW 16.5 (*)    All other components within normal limits  HEPARIN LEVEL (UNFRACTIONATED) - Abnormal; Notable for the following:    Heparin Unfractionated <0.10 (*)    All other components within normal limits  BASIC METABOLIC PANEL - Abnormal; Notable for the following:    Potassium 3.2 (*)    Chloride 100 (*)    Glucose, Bld 131 (*)    All other components within normal limits  TROPONIN I - Abnormal; Notable for the following:    Troponin I 0.07 (*)    All other components within normal limits  I-STAT CHEM 8, ED - Abnormal; Notable for the following:    Glucose, Bld 120 (*)    Calcium, Ion 0.98 (*)  All other components within normal limits  MRSA PCR SCREENING  MRSA PCR SCREENING  APTT  MAGNESIUM  PHOSPHORUS  HEPARIN LEVEL (UNFRACTIONATED)  I-STAT TROPOININ, ED  I-STAT TROPOININ, ED  Randolm Idol, ED    Imaging Review Dg Chest Portable 1 View  04/07/2015  CLINICAL DATA:  Shortness of breath. EXAM: PORTABLE CHEST 1 VIEW COMPARISON:  February 22, 2015. FINDINGS: Stable cardiomegaly. No pneumothorax or significant pleural effusion is noted. Stable bibasilar interstitial densities are noted most consistent with fibrosis or atelectasis. Bony thorax is intact. IMPRESSION: Stable bibasilar interstitial densities are noted most consistent with fibrosis or possibly atelectasis. No significant changes noted compared to prior exam. Electronically Signed   By: Marijo Conception, M.D.   On: 04/07/2015 18:20   I have personally reviewed and evaluated these images and lab results as part of my medical decision-making.   EKG Interpretation   Date/Time:  Friday April 07 2015 17:16:51 EST Ventricular Rate:  120 PR Interval:  155 QRS Duration: 136 QT Interval:  314 QTC Calculation: 444 R Axis:   -60 Text Interpretation:  Ectopic atrial tachycardia, unifocal Atrial  premature complexes Left bundle branch block  ED PHYSICIAN INTERPRETATION  AVAILABLE IN [facility] Confirmed by TEST, Record ([id number]) on 04/08/2015  8:49:31 AM      MDM   Final diagnoses:  Wide-complex tachycardia (HCC)  SOB (shortness of breath)    Patient 60 year old male with a recent history of MI with stent placement who presents with sudden onset of shortness of breath that started around 3 PM while at the [place]. Patient found by ems to be in a complex tachycardia that resolved after 2 doses of adenosine and was started on Cardizem. Upon arrival patient is tachycardic with a heart rate in the 110s the GCS of 15. EKG with left bundle branch block. Initial troponin negative and labs reassuring. Cardiology consulted. Patient will be admitted to cardiology for further management and evaluation.    Heriberto Antigua, MD 04/08/15 1027  Elnora Morrison, MD 04/09/15 [phone]

## 2015-04-07 NOTE — Consult Note (Signed)
ANTICOAGULATION CONSULT NOTE - Initial Consult  Pharmacy Consult for heparin Indication: atrial fibrillation  No Known Allergies  Patient Measurements: Height: 5\' 7"  (170.2 cm) Weight: 172 lb (78.019 kg) IBW/kg (Calculated) : 66.1 Heparin Dosing Weight: 78 kg  Vital Signs: Temp: 98.4 F (36.9 C) (02/03 1658) Temp Source: Oral (02/03 1658) BP: 130/84 mmHg (02/03 2101) Pulse Rate: 115 (02/03 2101)  Labs:  Recent Labs  04/05/15 1043 04/07/15 1754 04/07/15 1814  HGB  --  11.8* 15.0  HCT  --  36.7* 44.0  PLT  --  333  --   APTT  --  31  --   CREATININE 0.60* 0.80 0.80    Estimated Creatinine Clearance: 90.7 mL/min (by C-G formula based on Cr of 0.8).   Assessment: 60 YO male presented w/ SOB. Pt found to be in atrial flutter. Starting heparin. No anticoag PTA. Pt also started on amiodarone.  Goal of Therapy:  Heparin level 0.3-0.7 units/ml Monitor platelets by anticoagulation protocol: Yes   Plan:  Give 4000 units bolus x 1 Start heparin infusion at 1000 units/hr Check anti-Xa level in 6 hours and daily while on heparin Continue to monitor H&H and platelets  Joya San, PharmD Clinical Pharmacy Resident Pager # [phone] 04/07/2015 9:36 PM

## 2015-04-08 LAB — CBC
HCT: 35.5 % — ABNORMAL LOW (ref 39.0–52.0)
HEMOGLOBIN: 11.5 g/dL — AB (ref 13.0–17.0)
MCH: 24.8 pg — ABNORMAL LOW (ref 26.0–34.0)
MCHC: 32.4 g/dL (ref 30.0–36.0)
MCV: 76.5 fL — ABNORMAL LOW (ref 78.0–100.0)
Platelets: 339 10*3/uL (ref 150–400)
RBC: 4.64 MIL/uL (ref 4.22–5.81)
RDW: 16.5 % — ABNORMAL HIGH (ref 11.5–15.5)
WBC: 11.5 10*3/uL — ABNORMAL HIGH (ref 4.0–10.5)

## 2015-04-08 LAB — BASIC METABOLIC PANEL
ANION GAP: 15 (ref 5–15)
BUN: 9 mg/dL (ref 6–20)
CO2: 23 mmol/L (ref 22–32)
Calcium: 9.1 mg/dL (ref 8.9–10.3)
Chloride: 100 mmol/L — ABNORMAL LOW (ref 101–111)
Creatinine, Ser: 0.76 mg/dL (ref 0.61–1.24)
GFR calc Af Amer: >60 mL/min (ref >60)
GLUCOSE: 131 mg/dL — AB (ref 65–99)
POTASSIUM: 3.2 mmol/L — AB (ref 3.5–5.1)
Sodium: 138 mmol/L (ref 135–145)

## 2015-04-08 LAB — HEPARIN LEVEL (UNFRACTIONATED)
HEPARIN UNFRACTIONATED: 0.25 [IU]/mL — AB (ref 0.30–0.70)
Heparin Unfractionated: 0.15 IU/mL — ABNORMAL LOW (ref 0.30–0.70)

## 2015-04-08 LAB — TROPONIN I: Troponin I: 0.07 ng/mL — ABNORMAL HIGH (ref <0.031)

## 2015-04-08 LAB — MRSA PCR SCREENING: MRSA BY PCR: NEGATIVE

## 2015-04-08 MED ORDER — POTASSIUM CHLORIDE CRYS ER 20 MEQ PO TBCR
20.0000 meq | EXTENDED_RELEASE_TABLET | Freq: Every day | ORAL | Status: DC
  Administered 2015-04-08 – 2015-04-15 (×8): 20 meq via ORAL
  Filled 2015-04-08 (×9): qty 1

## 2015-04-08 MED ORDER — METOPROLOL TARTRATE 1 MG/ML IV SOLN
INTRAVENOUS | Status: AC
  Filled 2015-04-08: qty 5

## 2015-04-08 MED ORDER — HEPARIN BOLUS VIA INFUSION
2000.0000 [IU] | Freq: Once | INTRAVENOUS | Status: AC
  Administered 2015-04-08: 2000 [IU] via INTRAVENOUS
  Filled 2015-04-08: qty 2000

## 2015-04-08 MED ORDER — METOPROLOL TARTRATE 1 MG/ML IV SOLN
2.5000 mg | Freq: Once | INTRAVENOUS | Status: AC
  Administered 2015-04-08: 2.5 mg via INTRAVENOUS

## 2015-04-08 MED ORDER — POTASSIUM CHLORIDE 20 MEQ/15ML (10%) PO SOLN
40.0000 meq | Freq: Once | ORAL | Status: AC
  Administered 2015-04-08: 40 meq via ORAL
  Filled 2015-04-08: qty 30

## 2015-04-08 MED ORDER — CHLORHEXIDINE GLUCONATE 0.12 % MT SOLN
15.0000 mL | Freq: Two times a day (BID) | OROMUCOSAL | Status: DC
  Administered 2015-04-08: 15 mL via OROMUCOSAL
  Filled 2015-04-08 (×2): qty 15

## 2015-04-08 MED ORDER — CETYLPYRIDINIUM CHLORIDE 0.05 % MT LIQD
7.0000 mL | Freq: Two times a day (BID) | OROMUCOSAL | Status: DC
  Administered 2015-04-08 (×2): 7 mL via OROMUCOSAL

## 2015-04-08 MED ORDER — FUROSEMIDE 10 MG/ML IJ SOLN
40.0000 mg | Freq: Once | INTRAMUSCULAR | Status: AC
  Administered 2015-04-08: 40 mg via INTRAVENOUS

## 2015-04-08 NOTE — Progress Notes (Signed)
ANTICOAGULATION CONSULT NOTE - Follow Up Consult  Pharmacy Consult for Heparin  Indication: atrial fibrillation  No Known Allergies  Patient Measurements: Height: 5\' 7"  (170.2 cm) Weight: 172 lb (78.019 kg) IBW/kg (Calculated) : 66.1  Vital Signs: Temp: 97.8 F (36.6 C) (02/04 0417) Temp Source: Axillary (02/04 0417) BP: 138/97 mmHg (02/04 0400) Pulse Rate: 112 (02/04 0400)  Labs:  Recent Labs  04/07/15 1754 04/07/15 1814 04/07/15 2358 04/08/15 0351  HGB 11.8* 15.0  --  11.5*  HCT 36.7* 44.0  --  35.5*  PLT 333  --   --  339  APTT 31  --   --   --   HEPARINUNFRC  --   --   --  <0.10*  CREATININE 0.80 0.80  --  0.76  TROPONINI  --   --  0.07*  --    Estimated Creatinine Clearance: 89.5 mL/min (by C-G formula based on Cr of 0.76).  Assessment: Heparin for aifb, initial heparin level is undetectable, no issues per RN.   Goal of Therapy:  Heparin level 0.3-0.7 units/ml Monitor platelets by anticoagulation protocol: Yes   Plan:  -Heparin 2000 units BOLUS -Increase heparin drip to 1300 units/hr -1200 HL -Daily CBC/HL -Monitor for bleeding  Narda Bonds 04/08/2015,5:03 AM

## 2015-04-08 NOTE — Progress Notes (Signed)
RT placed patient on BIPAP per MD order. Patient had increase WOB. Patient was placed on 12/6 and 40%. Patient tolerating well. RT will continue to monitor

## 2015-04-08 NOTE — Progress Notes (Signed)
Paged Dr. Susy Manor regarding patient's increased SOB, high BP, increased HR. Orders to be placed for lopressor, lasix, and bipap. Will continue to monitor closely.

## 2015-04-08 NOTE — Progress Notes (Signed)
Paged MD twice re: K of 3.2. Will continue to monitor closely.

## 2015-04-08 NOTE — Progress Notes (Signed)
ANTICOAGULATION CONSULT NOTE - Follow Up Consult  Pharmacy Consult for heparin Indication: atrial fibrillation  No Known Allergies  Patient Measurements: Height: 5\' 7"  (170.2 cm) Weight: 168 lb 6.9 oz (76.4 kg) IBW/kg (Calculated) : 66.1  Vital Signs: Temp: 98 F (36.7 C) (02/04 1917) Temp Source: Oral (02/04 1917) BP: 87/58 mmHg (02/04 1839) Pulse Rate: 108 (02/04 1839)  Labs:  Recent Labs  04/07/15 1754 04/07/15 1814 04/07/15 2358 04/08/15 0351 04/08/15 1134 04/08/15 2018  HGB 11.8* 15.0  --  11.5*  --   --   HCT 36.7* 44.0  --  35.5*  --   --   PLT 333  --   --  339  --   --   APTT 31  --   --   --   --   --   HEPARINUNFRC  --   --   --  <0.10* 0.15* 0.25*  CREATININE 0.80 0.80  --  0.76  --   --   TROPONINI  --   --  0.07*  --   --   --     Assessment: 60 yo M admitted 04/07/2015 with SOB. Pt found to be in atrial flutter. Pharmacy consulted to start heparin. Patient was not on anticoag PTA.   HL 0.25 goal (0.3-0.7). H/H stable, Plt wnl. Per RN no issues with line and no s/sx of bleeding noted.  Goal of Therapy:  Heparin level 0.3-0.7 units/ml Monitor platelets by anticoagulation protocol: Yes   Plan:  1. Increase heparin drip to 1750 units/hr 2. Monitor daily HL, CBC and s/sx of bleeding  Vincenza Hews, PharmD, BCPS 04/08/2015, 9:30 PM Pager: [phone]

## 2015-04-08 NOTE — Progress Notes (Signed)
ANTICOAGULATION CONSULT NOTE - Follow Up Consult  Pharmacy Consult for heparin Indication: atrial fibrillation  No Known Allergies  Patient Measurements: Height: 5\' 7"  (170.2 cm) Weight: 168 lb 6.9 oz (76.4 kg) IBW/kg (Calculated) : 66.1  Vital Signs: Temp: 97.6 F (36.4 C) (02/04 1121) Temp Source: Oral (02/04 1121) BP: 132/65 mmHg (02/04 1300) Pulse Rate: 113 (02/04 1300)  Labs:  Recent Labs  04/07/15 1754 04/07/15 1814 04/07/15 2358 04/08/15 0351 04/08/15 1134  HGB 11.8* 15.0  --  11.5*  --   HCT 36.7* 44.0  --  35.5*  --   PLT 333  --   --  339  --   APTT 31  --   --   --   --   HEPARINUNFRC  --   --   --  <0.10* 0.15*  CREATININE 0.80 0.80  --  0.76  --   TROPONINI  --   --  0.07*  --   --     Estimated Creatinine Clearance: 89.5 mL/min (by C-G formula based on Cr of 0.76).   Medications:  Infusions:  . amiodarone 30 mg/hr (04/08/15 0516)  . heparin 1,300 Units/hr (04/08/15 1200)    Assessment: 60 yo M admitted 04/07/2015 with SOB. Pt found to be in atrial flutter. Pharmacy consulted to start heparin. Patient was not on anticoag PTA.   HL 0.15 (subtherapeutic). H/H stable, Plt wnl. Per RN no issues with line and no s/sx of bleeding noted.  Goal of Therapy:  Heparin level 0.3-0.7 units/ml Monitor platelets by anticoagulation protocol: Yes   Plan:  - Bolus Heparin 2000 units - Increase heparin drip to 1550 units/hr - Monitor 6hr HL - Monitor daily HL, CBC and s/sx of bleeding  Dimitri Ped, PharmD. PGY-1 Pharmacy Resident Pager: [phone] 04/08/2015,1:16 PM

## 2015-04-08 NOTE — Progress Notes (Signed)
K+ 3.2 MD made aware.

## 2015-04-08 NOTE — Progress Notes (Signed)
Subjective:  He had shortness of breath last night requiring a brief course of BiPAP.  He is sitting in the bed of the present time without shortness of breath.  His flutter rate has slowed somewhat and his ventricular response is now 111 from last night since he has been on the amiodarone drip.  Objective:  Vital Signs in the last 24 hours: BP 103/77 mmHg  Pulse 100  Temp(Src) 97.6 F (36.4 C) (Oral)  Resp 26  Ht 5\' 7"  (1.702 m)  Wt 76.4 kg (168 lb 6.9 oz)  BMI 26.37 kg/m2  SpO2 100%  Physical Exam: Pleasant black male sitting up in chair in no acute distress Lungs:  Basilar crackles  Cardiac: Rapid Regular rhythm, normal S1 and S2, S3 present Extremities:   No edema present  Intake/Output from previous day: 02/03 0701 - 02/04 0700 In: 330 [I.V.:330] Out: 1525 [Urine:1525]  Weight Filed Weights   04/07/15 1655 04/07/15 1932 04/08/15 0500  Weight: 78.019 kg (172 lb) 78.019 kg (172 lb) 76.4 kg (168 lb 6.9 oz)    Lab Results: Basic Metabolic Panel:  Recent Labs  04/07/15 1754 04/07/15 1814 04/08/15 0351  NA 140 140 138  K 3.8 3.6 3.2*  CL 103 102 100*  CO2 23  --  23  GLUCOSE 124* 120* 131*  BUN 10 10 9   CREATININE 0.80 0.80 0.76   CBC:  Recent Labs  04/07/15 1754 04/07/15 1814 04/08/15 0351  WBC 14.3*  --  11.5*  HGB 11.8* 15.0 11.5*  HCT 36.7* 44.0 35.5*  MCV 77.8*  --  76.5*  PLT 333  --  339   Cardiac Enzymes: Troponin (Point of Care Test)  Recent Labs  04/07/15 2008  TROPIPOC 0.02   Cardiac Panel (last 3 results)  Recent Labs  04/07/15 2358  TROPONINI 0.07*    Telemetry: Atrial flutter with 2:1 block, ventricular rate 111  Assessment/Plan:  1.  Wide complex tachycardia last night that may have been atrial flutter with one-to-one conduction.  Currently ventricular rate has slowed on amiodarone 2.  Acute on chronic systolic heart failure somewhat better 3.  Coronary artery disease with recent stent of circumflex and residual disease  in LAD and right coronary artery 4.  Hypokalemia  Recommendations:  Continue amiodarone.  Rate appears to be slowing.  Use beta blockers.  Await results of echocardiogram.  Continue intravenous heparin.  Continue intravenous furosemide.  He may require a TEE cardioversion and will need to decide what to do with anticoagulation following that.     Kerry Hough  MD [facility]  04/08/2015, 11:34 AM

## 2015-04-09 LAB — BASIC METABOLIC PANEL
Anion gap: 13 (ref 5–15)
BUN: 11 mg/dL (ref 6–20)
CHLORIDE: 97 mmol/L — AB (ref 101–111)
CO2: 24 mmol/L (ref 22–32)
Calcium: 8.4 mg/dL — ABNORMAL LOW (ref 8.9–10.3)
Creatinine, Ser: 0.73 mg/dL (ref 0.61–1.24)
GFR calc Af Amer: >60 mL/min (ref >60)
Glucose, Bld: 94 mg/dL (ref 65–99)
Potassium: 3.3 mmol/L — ABNORMAL LOW (ref 3.5–5.1)
SODIUM: 134 mmol/L — AB (ref 135–145)

## 2015-04-09 LAB — CBC
HCT: 31.6 % — ABNORMAL LOW (ref 39.0–52.0)
HEMOGLOBIN: 10.4 g/dL — AB (ref 13.0–17.0)
MCH: 25.1 pg — AB (ref 26.0–34.0)
MCHC: 32.9 g/dL (ref 30.0–36.0)
MCV: 76.3 fL — ABNORMAL LOW (ref 78.0–100.0)
Platelets: 263 10*3/uL (ref 150–400)
RBC: 4.14 MIL/uL — AB (ref 4.22–5.81)
RDW: 16.5 % — ABNORMAL HIGH (ref 11.5–15.5)
WBC: 11.5 10*3/uL — ABNORMAL HIGH (ref 4.0–10.5)

## 2015-04-09 LAB — HEPARIN LEVEL (UNFRACTIONATED): Heparin Unfractionated: 0.34 IU/mL (ref 0.30–0.70)

## 2015-04-09 LAB — GLUCOSE, CAPILLARY: GLUCOSE-CAPILLARY: 112 mg/dL — AB (ref 65–99)

## 2015-04-09 MED ORDER — FUROSEMIDE 40 MG PO TABS
40.0000 mg | ORAL_TABLET | Freq: Every day | ORAL | Status: DC
  Administered 2015-04-09 – 2015-04-10 (×2): 40 mg via ORAL
  Filled 2015-04-09 (×2): qty 1

## 2015-04-09 MED ORDER — CETYLPYRIDINIUM CHLORIDE 0.05 % MT LIQD
7.0000 mL | Freq: Two times a day (BID) | OROMUCOSAL | Status: DC
  Administered 2015-04-09 – 2015-04-15 (×12): 7 mL via OROMUCOSAL

## 2015-04-09 MED ORDER — POTASSIUM CHLORIDE 20 MEQ/15ML (10%) PO SOLN
40.0000 meq | Freq: Once | ORAL | Status: AC
  Administered 2015-04-09: 40 meq via ORAL
  Filled 2015-04-09: qty 30

## 2015-04-09 NOTE — Progress Notes (Signed)
RT Note: Pt has been off biapa since first night here.  Pt in no distress.  Rt will continue to monitor.

## 2015-04-09 NOTE — Progress Notes (Signed)
ANTICOAGULATION CONSULT NOTE - Follow Up Consult  Pharmacy Consult for heparin Indication: atrial fibrillation  No Known Allergies  Patient Measurements: Height: 5\' 7"  (170.2 cm) Weight: 168 lb 14 oz (76.6 kg) IBW/kg (Calculated) : 66.1  Vital Signs: Temp: 97.4 F (36.3 C) (02/05 0734) Temp Source: Oral (02/05 0734) BP: 112/72 mmHg (02/05 0734) Pulse Rate: 111 (02/05 0734)  Labs:  Recent Labs  04/07/15 1754 04/07/15 1814 04/07/15 2358  04/08/15 0351 04/08/15 1134 04/08/15 2018 04/09/15 0245  HGB 11.8* 15.0  --   --  11.5*  --   --  10.4*  HCT 36.7* 44.0  --   --  35.5*  --   --  31.6*  PLT 333  --   --   --  339  --   --  263  APTT 31  --   --   --   --   --   --   --   HEPARINUNFRC  --   --   --   < > <0.10* 0.15* 0.25* 0.34  CREATININE 0.80 0.80  --   --  0.76  --   --  0.73  TROPONINI  --   --  0.07*  --   --   --   --   --   < > = values in this interval not displayed.  Estimated Creatinine Clearance: 89.5 mL/min (by C-G formula based on Cr of 0.73).   Medications:  Infusions:  . amiodarone 30 mg/hr (04/09/15 0700)  . heparin 1,750 Units/hr (04/09/15 0700)    Assessment: 60 yo M admitted 04/07/2015 with SOB. Pt found to be in atrial flutter. Pharmacy consulted to start heparin. Patient was not on anticoag PTA.   HL 0.34 (therapeutic), H/H stable, Plt wnl. No s/sx of bleeding noted.  Goal of Therapy:  Heparin level 0.3-0.7 units/ml Monitor platelets by anticoagulation protocol: Yes   Plan:  - Continue Heparin at 1750 units/hr - Monitor daily HL, CBC and s/sx of bleeding - F/u long term anticoagulation plan  Dimitri Ped, PharmD. PGY-1 Pharmacy Resident Pager: [phone] 04/09/2015,7:49 AM

## 2015-04-09 NOTE — Progress Notes (Signed)
Subjective:  Continues to note that his breathing is improved and he feels the best he has felt in some time.  No ischemic chest pain.  His ventricular rate has now slowed to 107 which likely represents slowing of the atrial rate from the amiodarone.  Recently good diuresis, 5 pound weight loss since admission.  Objective:  Vital Signs in the last 24 hours: BP 94/75 mmHg  Pulse 109  Temp(Src) 97.4 F (36.3 C) (Oral)  Resp 22  Ht 5\' 7"  (1.702 m)  Wt 76.6 kg (168 lb 14 oz)  BMI 26.44 kg/m2  SpO2 96%  Physical Exam: Pleasant black male currently in no acute distress  Lungs:  Basilar crackles but improved over exam Friday night Cardiac: Rapid Regular rhythm, normal S1 and S2, S3 is much softer than it was Friday night.   Extremities:   No edema present  Intake/Output from previous day: 02/04 0701 - 02/05 0700 In: 1397.8 [P.O.:660; I.V.:737.8] Out: 1625 [Urine:1625]  Weight Filed Weights   04/07/15 1932 04/08/15 0500 04/09/15 0312  Weight: 78.019 kg (172 lb) 76.4 kg (168 lb 6.9 oz) 76.6 kg (168 lb 14 oz)    Lab Results: Basic Metabolic Panel:  Recent Labs  04/08/15 0351 04/09/15 0245  NA 138 134*  K 3.2* 3.3*  CL 100* 97*  CO2 23 24  GLUCOSE 131* 94  BUN 9 11  CREATININE 0.76 0.73   CBC:  Recent Labs  04/08/15 0351 04/09/15 0245  WBC 11.5* 11.5*  HGB 11.5* 10.4*  HCT 35.5* 31.6*  MCV 76.5* 76.3*  PLT 339 263   Cardiac Enzymes: Troponin (Point of Care Test)  Recent Labs  04/07/15 2008  TROPIPOC 0.02   Cardiac Panel (last 3 results)  Recent Labs  04/07/15 2358  TROPONINI 0.07*    Telemetry: Atrial flutter with 2:1 block, ventricular rate 107   Assessment/Plan:  1.  Wide complex tachycardia last night that may have been atrial flutter with one-to-one conduction.  Currently ventricular rate has slowed on amiodarone 2.  Acute on chronic systolic heart failure clinically improved  3.  Coronary artery disease with recent stent of circumflex and  residual disease in LAD and right coronary artery 4.  Hypokalemia  Recommendations:  Continue amiodarone intravenously.  Awaiting results of echocardiogram.  We will change to oral furosemide since his breathing is better.  We'll plan TEE cardioversion tomorrow.  Afterwards will need to talk about whether or not to proceed with PCI of the LAD stenosis and then determine what long-term anticoagulation strategy will be in face of atrial flutter.  Another option might be to consider an atrial flutter ablation.  Discussed TEE cardioversion including risks with patient and family.  Replete potassium.   Kerry Hough  MD [facility]  04/09/2015, 10:16 AM

## 2015-04-10 ENCOUNTER — Ambulatory Visit (HOSPITAL_COMMUNITY): Payer: Medicare Other

## 2015-04-10 DIAGNOSIS — I472 Ventricular tachycardia: Principal | ICD-10-CM

## 2015-04-10 DIAGNOSIS — I252 Old myocardial infarction: Secondary | ICD-10-CM

## 2015-04-10 DIAGNOSIS — I2510 Atherosclerotic heart disease of native coronary artery without angina pectoris: Secondary | ICD-10-CM

## 2015-04-10 DIAGNOSIS — I5023 Acute on chronic systolic (congestive) heart failure: Secondary | ICD-10-CM

## 2015-04-10 DIAGNOSIS — I447 Left bundle-branch block, unspecified: Secondary | ICD-10-CM

## 2015-04-10 DIAGNOSIS — I4892 Unspecified atrial flutter: Secondary | ICD-10-CM

## 2015-04-10 LAB — CBC
HEMATOCRIT: 33.4 % — AB (ref 39.0–52.0)
HEMOGLOBIN: 10.9 g/dL — AB (ref 13.0–17.0)
MCH: 24.8 pg — AB (ref 26.0–34.0)
MCHC: 32.6 g/dL (ref 30.0–36.0)
MCV: 76.1 fL — AB (ref 78.0–100.0)
Platelets: 242 10*3/uL (ref 150–400)
RBC: 4.39 MIL/uL (ref 4.22–5.81)
RDW: 16.4 % — ABNORMAL HIGH (ref 11.5–15.5)
WBC: 11.6 10*3/uL — AB (ref 4.0–10.5)

## 2015-04-10 LAB — BASIC METABOLIC PANEL
ANION GAP: 11 (ref 5–15)
BUN: 13 mg/dL (ref 6–20)
CALCIUM: 8.6 mg/dL — AB (ref 8.9–10.3)
CO2: 25 mmol/L (ref 22–32)
Chloride: 96 mmol/L — ABNORMAL LOW (ref 101–111)
Creatinine, Ser: 0.78 mg/dL (ref 0.61–1.24)
GFR calc Af Amer: >60 mL/min (ref >60)
GLUCOSE: 110 mg/dL — AB (ref 65–99)
Potassium: 4 mmol/L (ref 3.5–5.1)
Sodium: 132 mmol/L — ABNORMAL LOW (ref 135–145)

## 2015-04-10 LAB — HEPARIN LEVEL (UNFRACTIONATED): Heparin Unfractionated: 0.39 IU/mL (ref 0.30–0.70)

## 2015-04-10 MED ORDER — PERFLUTREN LIPID MICROSPHERE
INTRAVENOUS | Status: AC
  Administered 2015-04-10: 2 mL
  Filled 2015-04-10: qty 10

## 2015-04-10 MED ORDER — SODIUM CHLORIDE 0.9 % IV SOLN: 250.0000 mL | INTRAVENOUS | Status: DC

## 2015-04-10 MED ORDER — CLOPIDOGREL BISULFATE 300 MG PO TABS
300.0000 mg | ORAL_TABLET | Freq: Once | ORAL | Status: AC
  Administered 2015-04-10: 300 mg via ORAL
  Filled 2015-04-10: qty 1

## 2015-04-10 MED ORDER — FUROSEMIDE 10 MG/ML IJ SOLN
40.0000 mg | Freq: Two times a day (BID) | INTRAMUSCULAR | Status: DC
  Administered 2015-04-10 – 2015-04-13 (×6): 40 mg via INTRAVENOUS
  Filled 2015-04-10 (×6): qty 4

## 2015-04-10 MED ORDER — FUROSEMIDE 10 MG/ML IJ SOLN
20.0000 mg | Freq: Once | INTRAMUSCULAR | Status: AC
  Administered 2015-04-10: 20 mg via INTRAVENOUS
  Filled 2015-04-10: qty 2

## 2015-04-10 MED ORDER — SODIUM CHLORIDE 0.9% FLUSH: 3.0000 mL | INTRAVENOUS | Status: DC | PRN

## 2015-04-10 MED ORDER — AMIODARONE HCL 200 MG PO TABS
400.0000 mg | ORAL_TABLET | Freq: Two times a day (BID) | ORAL | Status: DC
  Administered 2015-04-10 – 2015-04-15 (×11): 400 mg via ORAL
  Filled 2015-04-10 (×11): qty 2

## 2015-04-10 MED ORDER — CLOPIDOGREL BISULFATE 75 MG PO TABS
75.0000 mg | ORAL_TABLET | Freq: Every day | ORAL | Status: DC
  Administered 2015-04-11 – 2015-04-15 (×5): 75 mg via ORAL
  Filled 2015-04-10 (×5): qty 1

## 2015-04-10 MED ORDER — SODIUM CHLORIDE 0.9% FLUSH
3.0000 mL | Freq: Two times a day (BID) | INTRAVENOUS | Status: DC
  Administered 2015-04-10 – 2015-04-15 (×9): 3 mL via INTRAVENOUS

## 2015-04-10 NOTE — Progress Notes (Signed)
Echocardiogram 2D Echocardiogram with Definity has been performed.  Tresa Res 04/10/2015, 1:49 PM

## 2015-04-10 NOTE — Progress Notes (Signed)
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   CURRENT MEDS . antiseptic oral rinse  7 mL Mouth Rinse BID  . Apremilast  1 tablet Oral BID  . aspirin  324 mg Oral Once  . aspirin  81 mg Oral Daily  . atorvastatin  80 mg Oral q1800  . carvedilol  12.5 mg Oral BID WC  . furosemide  20 mg Intravenous Once  . furosemide  40 mg Oral Daily  . leflunomide  20 mg Oral Daily  . lisinopril  5 mg Oral Daily  . pantoprazole  40 mg Oral Daily  . potassium chloride  20 mEq Oral Daily  . ticagrelor  90 mg Oral BID    OBJECTIVE  Filed Vitals:   04/10/15 1100 04/10/15 1200 04/10/15 1256 04/10/15 1300  BP:  99/77 99/77   Pulse: 102 102  103  Temp:   98 F (36.7 C)   TempSrc:      Resp: 28 15  22   Height:      Weight:      SpO2: 96% 99%  99%    Intake/Output Summary (Last 24 hours) at 04/10/15 1415 Last data filed at 04/10/15 1300  Gross per 24 hour  Intake 1352.4 ml  Output    800 ml  Net  552.4 ml   Filed Weights   04/08/15 0500 04/09/15 0312 04/10/15 0342  Weight: 168 lb 6.9 oz (76.4 kg) 168 lb 14 oz (76.6 kg) 169 lb 5 oz (76.8 kg)    PHYSICAL EXAM  General: Pleasant, NAD. Neuro: Alert and oriented X 3. Moves all extremities spontaneously. Psych: Normal affect. HEENT:  Normal  Neck: Supple without bruits  +JVD. Lungs:  Resp regular and unlabored. Bibasilar rale Heart: tachycardic. no s3, s4, or murmurs. Abdomen: Soft, non-tender, non-distended, BS + x 4.  Extremities: No clubbing, cyanosis or edema. DP/PT/Radials 2+ and equal bilaterally.  Accessory Clinical Findings  CBC  Recent  Labs  04/09/15 0245 04/10/15 0313  WBC 11.5* 11.6*  HGB 10.4* 10.9*  HCT 31.6* 33.4*  MCV 76.3* 76.1*  PLT 263 [id number]   Basic Metabolic Panel  Recent Labs  04/07/15 1754  04/09/15 0245 04/10/15 0313  NA 140  < > 134* 132*  K 3.8  < > 3.3* 4.0  CL 103  < > 97* 96*  CO2 23  < > 24 25  GLUCOSE 124*  < > 94 110*  BUN 10  < > 11 13  CREATININE 0.80  < > 0.73 0.78  CALCIUM 9.0  < > 8.4* 8.6*  MG 1.7  --   --   --   PHOS 3.2  --   --   --   < > = values in this interval not displayed.  Cardiac Enzymes  Recent Labs  04/07/15 2358  TROPONINI 0.07*    TELE Aflutter with variable ventricular conduction yesterday, however went into 2:1 aflutter this morning with HR 105    ECG  2:1 atrial flutter  Echocardiogram 02/23/2015  LV EF: 35% -  40%  ------------------------------------------------------------------- Indications:   MI - follow-up 410.92.  -------------------------------------------------------------------  History:  PMH: STEMI. Risk factors: Hypertension.  ------------------------------------------------------------------- Study Conclusions  - Left ventricle: The cavity size was normal. Wall thickness was increased in a pattern of mild LVH. Systolic function was moderately reduced. The estimated ejection fraction was in the range of 35% to 40%. Diffuse hypokinesis. There is akinesis of the inferior and inferoseptal myocardium. - Mitral valve: Calcified annulus. - Left atrium: The atrium was mildly dilated. - Right atrium: The atrium was mildly dilated. - Tricuspid valve: There was mild-moderate regurgitation. - Pulmonary arteries: Systolic pressure was mildly increased. PA peak pressure: 48 mm Hg (S). - Pericardium, extracardiac: A small pericardial effusion was identified.  Impressions:  - Technically difficult; EF difficult to quantitate due to tachycardia and atrial arrhythmias; global hypokinesis with akinesis of the  inferior and inferoseptal wall; overall moderately reduced LV function; mild biatrial enlargement; mild to moderate TR; mildly elevated pulmonary pressure; small pericardial effusion.     Radiology/Studies  Dg Chest Portable 1 View  04/07/2015  CLINICAL DATA:  Shortness of breath. EXAM: PORTABLE CHEST 1 VIEW COMPARISON:  February 22, 2015. FINDINGS: Stable cardiomegaly. No pneumothorax or significant pleural effusion is noted. Stable bibasilar interstitial densities are noted most consistent with fibrosis or atelectasis. Bony thorax is intact. IMPRESSION: Stable bibasilar interstitial densities are noted most consistent with fibrosis or possibly atelectasis. No significant changes noted compared to prior exam. Electronically Signed   By: Marijo Conception, M.D.   On: 04/07/2015 18:20    ASSESSMENT AND PLAN  1. Wide complex tachycardia likely 2:1 atrial flutter vs VT, previous EKG shows likely 2:1 atrial flutter  - amiodarone initiated on arrival to help with rate control, currently on IV heparin, given CAD and HTN, CHA2DS2-Vasc score 2 (HTN, CAD), likely will need systemic anticoagulation.   - consider add low dose digoxin on top of amiodarone help further slow down HR. Unfortunately no opening for TEE DCCV today or tomorrow. I will transition amiodarone to PO 400mg  BID. If add systemic anticoagulation, will need to stop ASA and continue Brilinta. If still unable to control HR, will consider change coreg to metoprolol succinate, unable to increase coreg at this time given borderline BP    2. Acute on chronic systolic HF: transitioned to PO lasix yesterday, still coughing today, also has elevated JVD, I will give him another dose of IV lasix. 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  3. Coronary artery disease with previous stenting of the circumflex recently and residual stenosis in the LAD and right coronary artery  - cath  02/22/2015 DES to LCx and OM1, 80% ost LAD and 80% mid RCA to be treated later.  - no obvious chest pain. Pending repeat echo to evaluate EF, previous echo on 02/23/2015 showed EF 35-40%  4. HTN 5. Hyperlipidemia 6. Left bundle branch block  Signed, Woodward Ku Pager: [phone]  Agree with note by Almyra Deforest PA-C   Pt well known to me. I recently saw in the office and he was clearly in CHF. He appears to be in aflutter with 2:1. On Amio. BNP only 366 but has basilar crackles and elevated JVP. On IV hep. Will transition from Parker to plavis because of the need to be on a NOAC. Plan is for TEE DCCV on Wed. I will review cath film and make a decision regarding possible intervention on remaining CAD before starting NOAC.   Lorretta Harp, M.D., FACP, [facility], [place], [facility] [address] [place], [place]  [address]  [phone] 04/10/2015 2:54 PM

## 2015-04-10 NOTE — Telephone Encounter (Signed)
Delayed Entry: 2/1 - pt aware via VM okay to continue with current medication and RX sent to preferred pharmacy.

## 2015-04-10 NOTE — Care Management Important Message (Signed)
Important Message  Patient Details  Name: Curtis Flores MRN: [id number] Date of Birth: [birth year]   Medicare Important Message Given:  Yes    Louanne Belton 04/10/2015, 12:03 Ruth Message  Patient Details  Name: Curtis Flores MRN: [id number] Date of Birth: August 27, 1955   Medicare Important Message Given:  Yes    Mattson Dayal G 04/10/2015, 12:03 PM

## 2015-04-10 NOTE — Progress Notes (Signed)
Patient on schedule for TEE DCCV Wednesday.  Hilbert Corrigan PA Pager: [phone]

## 2015-04-10 NOTE — Progress Notes (Signed)
Obtained a EKG on pt at this time due to noticing p waves on cardiac monitor. Paged PA, Meng to inform of rhythm change. PA is going to consult MD and gather a plan for the pt.

## 2015-04-10 NOTE — Progress Notes (Signed)
ANTICOAGULATION CONSULT NOTE - Follow Up Consult  Pharmacy Consult for heparin Indication: atrial fibrillation  No Known Allergies  Patient Measurements: Height: 5\' 7"  (170.2 cm) Weight: 169 lb 5 oz (76.8 kg) IBW/kg (Calculated) : 66.1  Vital Signs: Temp: 98.3 F (36.8 C) (02/06 0340) Temp Source: Oral (02/06 0340) BP: 97/71 mmHg (02/06 0600) Pulse Rate: 102 (02/06 0600)  Labs:  Recent Labs  04/07/15 1754  04/07/15 2358 04/08/15 0351  04/08/15 2018 04/09/15 0245 04/10/15 0313  HGB 11.8*  < >  --  11.5*  --   --  10.4* 10.9*  HCT 36.7*  < >  --  35.5*  --   --  31.6* 33.4*  PLT 333  --   --  339  --   --  263 242  APTT 31  --   --   --   --   --   --   --   HEPARINUNFRC  --   --   --  <0.10*  < > 0.25* 0.34 0.39  CREATININE 0.80  < >  --  0.76  --   --  0.73 0.78  TROPONINI  --   --  0.07*  --   --   --   --   --   < > = values in this interval not displayed.  Estimated Creatinine Clearance: 89.5 mL/min (by C-G formula based on Cr of 0.78).   Medications:  Infusions:  . amiodarone 30 mg/hr (04/10/15 0626)  . heparin 1,750 Units/hr (04/09/15 2028)    Assessment: 60 yo M admitted 04/07/2015 with SOB. Pt found to be in atrial flutter. Pharmacy consulted to start heparin. Patient was not on anticoag PTA.   HL 0.39 (therapeutic), H/H stable, Plt wnl. No s/sx of bleeding noted.  Goal of Therapy:  Heparin level 0.3-0.7 units/ml Monitor platelets by anticoagulation protocol: Yes   Plan:  - Continue Heparin at 1750 units/hr - Monitor daily HL, CBC and s/sx of bleeding - F/u long term anticoagulation plan  Geroldine Esquivias C. Lennox Grumbles, PharmD Pharmacy Resident  Pager: [phone] 04/10/2015 7:57 AM

## 2015-04-11 ENCOUNTER — Other Ambulatory Visit (HOSPITAL_COMMUNITY): Payer: Medicare Other

## 2015-04-11 DIAGNOSIS — I255 Ischemic cardiomyopathy: Secondary | ICD-10-CM

## 2015-04-11 LAB — BASIC METABOLIC PANEL
Anion gap: 11 (ref 5–15)
BUN: 10 mg/dL (ref 6–20)
CHLORIDE: 97 mmol/L — AB (ref 101–111)
CO2: 25 mmol/L (ref 22–32)
Calcium: 8.3 mg/dL — ABNORMAL LOW (ref 8.9–10.3)
Creatinine, Ser: 0.82 mg/dL (ref 0.61–1.24)
GFR calc Af Amer: >60 mL/min (ref >60)
GFR calc non Af Amer: >60 mL/min (ref >60)
GLUCOSE: 95 mg/dL (ref 65–99)
POTASSIUM: 3.4 mmol/L — AB (ref 3.5–5.1)
Sodium: 133 mmol/L — ABNORMAL LOW (ref 135–145)

## 2015-04-11 LAB — CBC
HCT: 32.8 % — ABNORMAL LOW (ref 39.0–52.0)
HEMOGLOBIN: 10.4 g/dL — AB (ref 13.0–17.0)
MCH: 23.9 pg — AB (ref 26.0–34.0)
MCHC: 31.7 g/dL (ref 30.0–36.0)
MCV: 75.2 fL — AB (ref 78.0–100.0)
Platelets: 239 10*3/uL (ref 150–400)
RBC: 4.36 MIL/uL (ref 4.22–5.81)
RDW: 16.3 % — ABNORMAL HIGH (ref 11.5–15.5)
WBC: 13.1 10*3/uL — ABNORMAL HIGH (ref 4.0–10.5)

## 2015-04-11 LAB — GLUCOSE, CAPILLARY: Glucose-Capillary: 82 mg/dL (ref 65–99)

## 2015-04-11 LAB — TSH: TSH: 1.188 u[IU]/mL (ref 0.350–4.500)

## 2015-04-11 LAB — HEPARIN LEVEL (UNFRACTIONATED): Heparin Unfractionated: 0.46 IU/mL (ref 0.30–0.70)

## 2015-04-11 MED ORDER — SODIUM CHLORIDE 0.9 % IV SOLN
INTRAVENOUS | Status: DC
  Administered 2015-04-11: 20:00:00 via INTRAVENOUS

## 2015-04-11 MED ORDER — POTASSIUM CHLORIDE CRYS ER 20 MEQ PO TBCR
40.0000 meq | EXTENDED_RELEASE_TABLET | Freq: Once | ORAL | Status: AC
  Administered 2015-04-11: 40 meq via ORAL
  Filled 2015-04-11: qty 2

## 2015-04-11 NOTE — Progress Notes (Signed)
ANTICOAGULATION CONSULT NOTE - Follow Up Consult  Pharmacy Consult for heparin Indication: atrial fibrillation  No Known Allergies  Patient Measurements: Height: 5\' 7"  (170.2 cm) Weight: 167 lb 8.8 oz (76 kg) IBW/kg (Calculated) : 66.1  Vital Signs: Temp: 98.2 F (36.8 C) (02/07 0754) Temp Source: Oral (02/07 0754) BP: 126/87 mmHg (02/07 0800) Pulse Rate: 98 (02/07 0800)  Labs:  Recent Labs  04/09/15 0245 04/10/15 0313 04/11/15 0327  HGB 10.4* 10.9* 10.4*  HCT 31.6* 33.4* 32.8*  PLT 263 242 239  HEPARINUNFRC 0.34 0.39 0.46  CREATININE 0.73 0.78 0.82    Estimated Creatinine Clearance: 87.3 mL/min (by C-G formula based on Cr of 0.82).   Medications:  Infusions:  . sodium chloride    . heparin 1,750 Units/hr (04/10/15 2354)    Assessment: 60 yo M admitted 04/07/2015 with SOB. Pt found to be in atrial flutter. Pharmacy consulted to start heparin. Patient was not on anticoag PTA.   AM HL 0.46 (therapeutic), H/H stable, Plt wnl. No s/sx of bleeding noted.  Goal of Therapy:  Heparin level 0.3-0.7 units/ml Monitor platelets by anticoagulation protocol: Yes   Plan:  - Continue Heparin at 1750 units/hr - Monitor daily HL, CBC and s/sx of bleeding - F/u long term anticoagulation plan  Bharat Antillon C. Lennox Grumbles, PharmD Pharmacy Resident  Pager: [phone] 04/11/2015 9:16 AM

## 2015-04-11 NOTE — Progress Notes (Signed)
Spoke w pt. He uses [employer] [employer] in [place] but they are going out of business and transf him to [employer]. He has no medicare d plan. Gave him phone # for [facility] to try and get an addit 60days of brilinta and gave him pt assist form for brilinta also. He also was given 30day free care for eliquis and xarelto and pt assist form for both of these drugs depending on which he goes home on. Also gave him inform on shipp program thru  that helps senior w meds but this is based on income. He and wife will call to see if they will qualify for this assist. Also enc pt to fine medicare d plan to help w cost of meds.

## 2015-04-11 NOTE — Progress Notes (Signed)
Subjective:  Feeling somewhat better after additional diuresis (-1100cc). Still in Aflutter with RVR (110)  Objective:  Temp:  [97.4 F (36.3 C)-98.2 F (36.8 C)] 98.2 F (36.8 C) (02/07 0754) Pulse Rate:  [83-126] 98 (02/07 0800) Resp:  [13-32] 19 (02/07 0800) BP: (75-133)/(48-109) 126/87 mmHg (02/07 0800) SpO2:  [91 %-100 %] 96 % (02/07 0800) Weight:  [167 lb 8.8 oz (76 kg)] 167 lb 8.8 oz (76 kg) (02/07 0401) Weight change: -1 lb 12.2 oz (-0.8 kg)  Intake/Output from previous day: 02/06 0701 - 02/07 0700 In: 810.3 [P.O.:240; I.V.:570.3] Out: 1100 [Urine:1100]  Intake/Output from this shift:    Physical Exam: General appearance: alert and no distress Neck: no adenopathy, no carotid bruit, no JVD, supple, symmetrical, trachea midline and thyroid not enlarged, symmetric, no tenderness/mass/nodules Lungs: Crackles Left base Heart: irregularly irregular rhythm Extremities: extremities normal, atraumatic, no cyanosis or edema  Lab Results: Results for orders placed or performed during the hospital encounter of 04/07/15 (from the past 48 hour(s))  Glucose, capillary     Status: Abnormal   Collection Time: 04/09/15 12:45 PM  Result Value Ref Range   Glucose-Capillary 112 (H) 65 - 99 mg/dL   Comment 1 Capillary Specimen   Basic metabolic panel     Status: Abnormal   Collection Time: 04/10/15  3:13 AM  Result Value Ref Range   Sodium 132 (L) 135 - 145 mmol/L   Potassium 4.0 3.5 - 5.1 mmol/L    Comment: DELTA CHECK NOTED   Chloride 96 (L) 101 - 111 mmol/L   CO2 25 22 - 32 mmol/L   Glucose, Bld 110 (H) 65 - 99 mg/dL   BUN 13 6 - 20 mg/dL   Creatinine, Ser 0.78 0.61 - 1.24 mg/dL   Calcium 8.6 (L) 8.9 - 10.3 mg/dL   GFR calc non Af Amer >60 >60 mL/min   GFR calc Af Amer >60 >60 mL/min    Comment: (NOTE) The eGFR has been calculated using the CKD EPI equation. This calculation has not been validated in all clinical situations. eGFR's persistently <60 mL/min signify  possible Chronic Kidney Disease.    Anion gap 11 5 - 15  Heparin level (unfractionated)     Status: None   Collection Time: 04/10/15  3:13 AM  Result Value Ref Range   Heparin Unfractionated 0.39 0.30 - 0.70 IU/mL    Comment:        IF HEPARIN RESULTS ARE BELOW EXPECTED VALUES, AND PATIENT DOSAGE HAS BEEN CONFIRMED, SUGGEST FOLLOW UP TESTING OF ANTITHROMBIN III LEVELS.   CBC     Status: Abnormal   Collection Time: 04/10/15  3:13 AM  Result Value Ref Range   WBC 11.6 (H) 4.0 - 10.5 K/uL   RBC 4.39 4.22 - 5.81 MIL/uL   Hemoglobin 10.9 (L) 13.0 - 17.0 g/dL   HCT 33.4 (L) 39.0 - 52.0 %   MCV 76.1 (L) 78.0 - 100.0 fL   MCH 24.8 (L) 26.0 - 34.0 pg   MCHC 32.6 30.0 - 36.0 g/dL   RDW 16.4 (H) 11.5 - 15.5 %   Platelets 242 150 - 400 K/uL  Heparin level (unfractionated)     Status: None   Collection Time: 04/11/15  3:27 AM  Result Value Ref Range   Heparin Unfractionated 0.46 0.30 - 0.70 IU/mL    Comment:        IF HEPARIN RESULTS ARE BELOW EXPECTED VALUES, AND PATIENT DOSAGE HAS BEEN CONFIRMED, SUGGEST FOLLOW UP TESTING  OF ANTITHROMBIN III LEVELS.   CBC     Status: Abnormal   Collection Time: 04/11/15  3:27 AM  Result Value Ref Range   WBC 13.1 (H) 4.0 - 10.5 K/uL   RBC 4.36 4.22 - 5.81 MIL/uL   Hemoglobin 10.4 (L) 13.0 - 17.0 g/dL   HCT 32.8 (L) 39.0 - 52.0 %   MCV 75.2 (L) 78.0 - 100.0 fL   MCH 23.9 (L) 26.0 - 34.0 pg   MCHC 31.7 30.0 - 36.0 g/dL   RDW 16.3 (H) 11.5 - 15.5 %   Platelets 239 150 - 400 K/uL  Basic metabolic panel     Status: Abnormal   Collection Time: 04/11/15  3:27 AM  Result Value Ref Range   Sodium 133 (L) 135 - 145 mmol/L   Potassium 3.4 (L) 3.5 - 5.1 mmol/L   Chloride 97 (L) 101 - 111 mmol/L   CO2 25 22 - 32 mmol/L   Glucose, Bld 95 65 - 99 mg/dL   BUN 10 6 - 20 mg/dL   Creatinine, Ser 0.82 0.61 - 1.24 mg/dL   Calcium 8.3 (L) 8.9 - 10.3 mg/dL   GFR calc non Af Amer >60 >60 mL/min   GFR calc Af Amer >60 >60 mL/min    Comment: (NOTE) The  eGFR has been calculated using the CKD EPI equation. This calculation has not been validated in all clinical situations. eGFR's persistently <60 mL/min signify possible Chronic Kidney Disease.    Anion gap 11 5 - 15  TSH     Status: None   Collection Time: 04/11/15  3:27 AM  Result Value Ref Range   TSH 1.188 0.350 - 4.500 uIU/mL  Glucose, capillary     Status: None   Collection Time: 04/11/15  8:35 AM  Result Value Ref Range   Glucose-Capillary 82 65 - 99 mg/dL    Imaging: Imaging results have been reviewed  Tele- Aflutter with variable RVR Assessment/Plan:   1. Principal Problem: 2.   Wide-complex tachycardia ([place]) 3. Active Problems: 4.   CAD (coronary artery disease), native coronary artery 5.   Atrial flutter ([place]) 6.   Old lateral wall myocardial infarction 7.   Acute on chronic systolic congestive heart failure ([place]) 8.   LBBB (left bundle branch block) 9.   Time Spent Directly with Patient:  20 minutes  Length of Stay:  LOS: 4 days   Good diuresis on lasix 40 mg IV BID. Replete K ( 40 meg) --->> re check. 2 D shows worsening EF (20%). His atrial arrhythmia may be contributing to this. On PO Amio load and IV hep, PO coreg. For TEE DCCV tomorrow. Will review cine regarding residual CAD. May want to see if EF improves in NSR before thinking about revasc of residual CAD.   Quay Burow 04/11/2015, 9:02 AM

## 2015-04-12 ENCOUNTER — Inpatient Hospital Stay (HOSPITAL_COMMUNITY): Payer: Medicare Other | Admitting: Certified Registered Nurse Anesthetist

## 2015-04-12 ENCOUNTER — Encounter (HOSPITAL_COMMUNITY)
Admission: EM | Disposition: A | Discharge status: Home / Self Care | Payer: Self-pay | Source: Home / Self Care | Attending: Cardiovascular Disease

## 2015-04-12 ENCOUNTER — Inpatient Hospital Stay (HOSPITAL_COMMUNITY)
Admit: 2015-04-12 | Discharge: 2015-04-12 | Disposition: A | Discharge status: Home / Self Care | Payer: Medicare Other | Attending: Physician Assistant | Admitting: Physician Assistant

## 2015-04-12 ENCOUNTER — Encounter (HOSPITAL_COMMUNITY): Payer: Self-pay | Admitting: *Deleted

## 2015-04-12 DIAGNOSIS — I4892 Unspecified atrial flutter: Secondary | ICD-10-CM

## 2015-04-12 HISTORY — PX: CARDIOVERSION: SHX1299

## 2015-04-12 HISTORY — PX: TEE WITHOUT CARDIOVERSION: SHX5443

## 2015-04-12 LAB — CBC
HEMATOCRIT: 30.9 % — AB (ref 39.0–52.0)
HEMOGLOBIN: 10 g/dL — AB (ref 13.0–17.0)
MCH: 24.4 pg — AB (ref 26.0–34.0)
MCHC: 32.4 g/dL (ref 30.0–36.0)
MCV: 75.4 fL — ABNORMAL LOW (ref 78.0–100.0)
Platelets: 249 10*3/uL (ref 150–400)
RBC: 4.1 MIL/uL — ABNORMAL LOW (ref 4.22–5.81)
RDW: 16.5 % — AB (ref 11.5–15.5)
WBC: 12.4 10*3/uL — ABNORMAL HIGH (ref 4.0–10.5)

## 2015-04-12 LAB — BASIC METABOLIC PANEL
Anion gap: 11 (ref 5–15)
BUN: 7 mg/dL (ref 6–20)
CHLORIDE: 97 mmol/L — AB (ref 101–111)
CO2: 26 mmol/L (ref 22–32)
CREATININE: 0.72 mg/dL (ref 0.61–1.24)
Calcium: 8.4 mg/dL — ABNORMAL LOW (ref 8.9–10.3)
GFR calc Af Amer: >60 mL/min (ref >60)
GFR calc non Af Amer: >60 mL/min (ref >60)
Glucose, Bld: 92 mg/dL (ref 65–99)
Potassium: 3.6 mmol/L (ref 3.5–5.1)
Sodium: 134 mmol/L — ABNORMAL LOW (ref 135–145)

## 2015-04-12 LAB — HEPARIN LEVEL (UNFRACTIONATED): HEPARIN UNFRACTIONATED: 0.43 [IU]/mL (ref 0.30–0.70)

## 2015-04-12 SURGERY — ECHOCARDIOGRAM, TRANSESOPHAGEAL
Anesthesia: Monitor Anesthesia Care

## 2015-04-12 MED ORDER — MIDAZOLAM HCL 2 MG/2ML IJ SOLN
INTRAMUSCULAR | Status: AC
  Filled 2015-04-12: qty 2

## 2015-04-12 MED ORDER — KETAMINE HCL 100 MG/ML IJ SOLN
INTRAMUSCULAR | Status: AC
  Filled 2015-04-12: qty 1

## 2015-04-12 MED ORDER — MIDAZOLAM HCL 5 MG/5ML IJ SOLN
INTRAMUSCULAR | Status: DC | PRN
  Administered 2015-04-12 (×2): 1 mg via INTRAVENOUS

## 2015-04-12 MED ORDER — BUTAMBEN-TETRACAINE-BENZOCAINE 2-2-14 % EX AERO
INHALATION_SPRAY | CUTANEOUS | Status: DC | PRN
  Administered 2015-04-12: 2 via TOPICAL

## 2015-04-12 MED ORDER — KETAMINE HCL 10 MG/ML IJ SOLN
INTRAMUSCULAR | Status: DC | PRN
Start: 2015-04-12 — End: 2015-04-12
  Administered 2015-04-12: 10 mg via INTRAVENOUS
  Administered 2015-04-12 (×2): 20 mg via INTRAVENOUS

## 2015-04-12 MED ORDER — SODIUM CHLORIDE 0.9 % IV SOLN
INTRAVENOUS | Status: DC | PRN
  Administered 2015-04-12: 13:00:00 via INTRAVENOUS

## 2015-04-12 MED ORDER — PROPOFOL 500 MG/50ML IV EMUL
INTRAVENOUS | Status: DC | PRN
  Administered 2015-04-12: 25 ug/kg/min via INTRAVENOUS

## 2015-04-12 NOTE — Anesthesia Preprocedure Evaluation (Addendum)
Anesthesia Evaluation  Patient identified by MRN, date of birth, ID band Patient awake    Reviewed: Allergy & Precautions, H&P , NPO status , Patient's Chart, lab work & pertinent test results  Airway Mallampati: II  TM Distance: >3 FB Neck ROM: Full    Dental no notable dental hx. (+) Edentulous Upper, Dental Advisory Given   Pulmonary neg pulmonary ROS, former smoker,    Pulmonary exam normal breath sounds clear to auscultation       Cardiovascular hypertension, Pt. on medications and Pt. on home beta blockers + CAD, + Past MI and +CHF  + dysrhythmias Atrial Fibrillation and Supra Ventricular Tachycardia  Rhythm:Irregular Rate:Normal     Neuro/Psych negative neurological ROS  negative psych ROS   GI/Hepatic Neg liver ROS, GERD  Medicated,  Endo/Other  negative endocrine ROS  Renal/GU negative Renal ROS  negative genitourinary   Musculoskeletal  (+) Arthritis , Rheumatoid disorders,    Abdominal   Peds  Hematology negative hematology ROS (+)   Anesthesia Other Findings   Reproductive/Obstetrics negative OB ROS                            Anesthesia Physical Anesthesia Plan  ASA: IV  Anesthesia Plan: MAC   Post-op Pain Management:    Induction: Intravenous  Airway Management Planned: Nasal Cannula  Additional Equipment:   Intra-op Plan:   Post-operative Plan:   Informed Consent: I have reviewed the patients History and Physical, chart, labs and discussed the procedure including the risks, benefits and alternatives for the proposed anesthesia with the patient or authorized representative who has indicated his/her understanding and acceptance.   Dental advisory given  Plan Discussed with: CRNA  Anesthesia Plan Comments:         Anesthesia Quick Evaluation

## 2015-04-12 NOTE — Progress Notes (Signed)
Patient underwent TEE DCCV today, no acute stress on exam, feeling well. HR 60-70s, maintaining NSR.  Subjective: denies any CP or SOB. Throat sore after TEE, otherwise feeling good  Physical exam:  Abdomen: soft, nontender Lung: bibasilar rale Cardiac: RRR General: no acute distress   1. Wide complex tachycardia likely 2:1 atrial flutter vs VT, previous EKG shows likely 2:1 atrial flutter - amiodarone initiated on arrival to help with rate control, currently on IV heparin, given CAD and HTN, CHA2DS2-Vasc score 2 (HTN, CAD), likely will need systemic anticoagulation.  - s/p TEE DCCV on 2/8, doing well, maintaining NSR, will continue amiodarone, expect change to 200mg  BID after 7 day of 400mg  BID amio. Potentially transfer to telemetry floor tomorrow.   2. Acute on chronic systolic HF: still fluid overloaded, will continue IV lasix, renal function stable.   3. Coronary artery disease with previous stenting of the circumflex recently and residual stenosis in the LAD and right coronary artery - cath 02/22/2015 DES to LCx and OM1, 80% ost LAD and 80% mid RCA to be treated later. - no obvious chest pain. Echo 04/10/2015 EF 20%, diffuse hypokinesis and inferior, inferoseptal, septal and apical akinesis. Previous echo on 02/23/2015 showed EF 35-40%  - will discuss with MD tomorrow regarding timing of correcting remaining residual CAD  4. HTN 5. Hyperlipidemia 6. Left bundle branch block  Signed, Almyra Deforest PA Pager: [phone]

## 2015-04-12 NOTE — Anesthesia Postprocedure Evaluation (Signed)
Anesthesia Post Note  Patient: Curtis Flores  Procedure(s) Performed: Procedure(s) (LRB): TRANSESOPHAGEAL ECHOCARDIOGRAM (TEE) (N/A) CARDIOVERSION (N/A)  Patient location during evaluation: PACU Anesthesia Type: MAC Level of consciousness: awake and alert Pain management: pain level controlled Vital Signs Assessment: post-procedure vital signs reviewed and stable Respiratory status: spontaneous breathing, nonlabored ventilation, respiratory function stable and patient connected to nasal cannula oxygen Cardiovascular status: stable and blood pressure returned to baseline Anesthetic complications: no    Last Vitals:  Filed Vitals:   04/12/15 1357 04/12/15 1407  BP: 115/90 124/83  Pulse: 77 77  Temp:    Resp: 22 24    Last Pain:  Filed Vitals:   04/12/15 1410  PainSc: 0-No pain                 Jamoni Broadfoot,W. EDMOND

## 2015-04-12 NOTE — Procedures (Signed)
Electrical Cardioversion Procedure Note Curtis Flores [id number] 1955-04-21  Procedure: Electrical Cardioversion Indications:  Atrial Flutter  Procedure Details Consent: Risks of procedure as well as the alternatives and risks of each were explained to the (patient/caregiver).  Consent for procedure obtained. Time Out: Verified patient identification, verified procedure, site/side was marked, verified correct patient position, special equipment/implants available, medications/allergies/relevent history reviewed, required imaging and test results available.  Performed  Patient placed on cardiac monitor, pulse oximetry, supplemental oxygen as necessary.  Sedation given: Per anesthesiology Pacer pads placed anterior and posterior chest.  Cardioverted 1 time(s).  Cardioverted at 150J.  Evaluation Findings: Post procedure EKG shows: NSR Complications: None Patient did tolerate procedure well.   [name] 04/12/2015, 1:30 PM

## 2015-04-12 NOTE — Progress Notes (Signed)
Patient has not worn BIPAP since 04/08/15. Patient is in no distress at this time. Will monitor as needed.

## 2015-04-12 NOTE — CV Procedure (Signed)
Procedure: TEE  Indication: Atrial flutter  Sedation: Per anesthesiology.  Findings: Please see echo section for full report.  Mildly dilated LV with EF 15-20%, diffuse hypokinesis.  The RV was mildly dilated and moderately hypokinetic.  There was trivial TR, trivial MR.  Trileaflet aortic valve with no stenosis or regurgitation.  Mildly dilated LA with no LAA thrombus.  No ASD/PFO, negative bubble study.  Normal caliber aorta with grade III plaque in the descending thoracic aorta.   Proceed with DCCV.  [name] 04/12/2015 1:30 PM

## 2015-04-12 NOTE — Transfer of Care (Signed)
Immediate Anesthesia Transfer of Care Note  Patient: Curtis Flores  Procedure(s) Performed: Procedure(s): TRANSESOPHAGEAL ECHOCARDIOGRAM (TEE) (N/A) CARDIOVERSION (N/A)  Patient Location: PACU and Endoscopy Unit  Anesthesia Type:MAC  Level of Consciousness: patient cooperative and responds to stimulation  Airway & Oxygen Therapy: Patient Spontanous Breathing and Patient connected to nasal cannula oxygen  Post-op Assessment: Report given to RN and Post -op Vital signs reviewed and stable  Post vital signs: Reviewed and stable  Last Vitals:  Filed Vitals:   04/12/15 0727 04/12/15 1125  BP: 119/72 106/74  Pulse: 81 97  Temp: 36.5 C   Resp: 19 29    Complications: No apparent anesthesia complications

## 2015-04-12 NOTE — Progress Notes (Signed)
ANTICOAGULATION CONSULT NOTE - Follow Up Consult  Pharmacy Consult for heparin Indication: atrial fibrillation  No Known Allergies  Patient Measurements: Height: 5\' 7"  (170.2 cm) Weight: 167 lb 12.3 oz (76.1 kg) IBW/kg (Calculated) : 66.1  Vital Signs: Temp: 97.8 F (36.6 C) (02/08 0343) Temp Source: Oral (02/08 0343) BP: 115/75 mmHg (02/08 0600) Pulse Rate: 82 (02/08 0600)  Labs:  Recent Labs  04/10/15 0313 04/11/15 0327 04/12/15 0345  HGB 10.9* 10.4* 10.0*  HCT 33.4* 32.8* 30.9*  PLT 242 239 249  HEPARINUNFRC 0.39 0.46 0.43  CREATININE 0.78 0.82 0.72    Estimated Creatinine Clearance: 89.5 mL/min (by C-G formula based on Cr of 0.72).   Medications:  Infusions:  . sodium chloride 20 mL/hr at 04/11/15 2002  . sodium chloride    . heparin 1,750 Units/hr (04/12/15 0243)    Assessment: 60 yo M admitted 04/07/2015 with SOB. Pt found to be in atrial flutter. Pharmacy consulted to start heparin. Patient was not on anticoag PTA.   AM HL 0.43 (therapeutic), H/H stable, Plt wnl. No s/sx of bleeding noted.  Goal of Therapy:  Heparin level 0.3-0.7 units/ml Monitor platelets by anticoagulation protocol: Yes   Plan:  - Continue Heparin at 1750 units/hr - Monitor daily HL, CBC and s/sx of bleeding - F/u long term anticoagulation plan  Abryanna Musolino C. Lennox Grumbles, PharmD Pharmacy Resident  Pager: [phone] 04/12/2015 7:25 AM

## 2015-04-13 ENCOUNTER — Encounter (HOSPITAL_COMMUNITY): Payer: Self-pay | Admitting: Cardiology

## 2015-04-13 LAB — BASIC METABOLIC PANEL
ANION GAP: 10 (ref 5–15)
BUN: 10 mg/dL (ref 6–20)
CALCIUM: 8.6 mg/dL — AB (ref 8.9–10.3)
CO2: 27 mmol/L (ref 22–32)
Chloride: 96 mmol/L — ABNORMAL LOW (ref 101–111)
Creatinine, Ser: 0.73 mg/dL (ref 0.61–1.24)
GFR calc non Af Amer: >60 mL/min (ref >60)
GLUCOSE: 100 mg/dL — AB (ref 65–99)
POTASSIUM: 3.9 mmol/L (ref 3.5–5.1)
Sodium: 133 mmol/L — ABNORMAL LOW (ref 135–145)

## 2015-04-13 LAB — CBC
HEMATOCRIT: 30 % — AB (ref 39.0–52.0)
HEMOGLOBIN: 9.6 g/dL — AB (ref 13.0–17.0)
MCH: 24.6 pg — ABNORMAL LOW (ref 26.0–34.0)
MCHC: 32 g/dL (ref 30.0–36.0)
MCV: 76.7 fL — AB (ref 78.0–100.0)
Platelets: 258 10*3/uL (ref 150–400)
RBC: 3.91 MIL/uL — AB (ref 4.22–5.81)
RDW: 16.8 % — AB (ref 11.5–15.5)
WBC: 12.9 10*3/uL — AB (ref 4.0–10.5)

## 2015-04-13 LAB — HEPARIN LEVEL (UNFRACTIONATED): HEPARIN UNFRACTIONATED: 0.32 [IU]/mL (ref 0.30–0.70)

## 2015-04-13 MED ORDER — APIXABAN 5 MG PO TABS
5.0000 mg | ORAL_TABLET | Freq: Two times a day (BID) | ORAL | Status: DC
  Administered 2015-04-13 – 2015-04-15 (×5): 5 mg via ORAL
  Filled 2015-04-13 (×5): qty 1

## 2015-04-13 MED ORDER — FUROSEMIDE 10 MG/ML IJ SOLN
60.0000 mg | Freq: Two times a day (BID) | INTRAMUSCULAR | Status: DC
  Administered 2015-04-13 – 2015-04-14 (×2): 60 mg via INTRAVENOUS
  Filled 2015-04-13 (×3): qty 6

## 2015-04-13 NOTE — Progress Notes (Signed)
ANTICOAGULATION CONSULT NOTE - Initial Consult  Pharmacy Consult for apixaban  Indication: atrial fibrillation/aflutter  No Known Allergies  Patient Measurements: Height: 5\' 7"  (170.2 cm) Weight: 168 lb 6.9 oz (76.4 kg) IBW/kg (Calculated) : 66.1  Vital Signs: Temp: 97.6 F (36.4 C) (02/09 0800) Temp Source: Oral (02/09 0800) BP: 127/72 mmHg (02/09 0800) Pulse Rate: 70 (02/09 0800)  Labs:  Recent Labs  04/11/15 0327 04/12/15 0345 04/13/15 0420  HGB 10.4* 10.0* 9.6*  HCT 32.8* 30.9* 30.0*  PLT 239 249 258  HEPARINUNFRC 0.46 0.43 0.32  CREATININE 0.82 0.72 0.73    Estimated Creatinine Clearance: 89.5 mL/min (by C-G formula based on Cr of 0.73).   Medical History: Past Medical History  Diagnosis Date  . Psoriasis 2003  . Rheumatoid arthritis(714.0) 2012  . Psoriatic arthritis ([place])   . Leukocytosis   . Lymphadenopathy   . Pulmonary fibrosis ([place])   . Hypertension   . GERD (gastroesophageal reflux disease)   . Hyperlipidemia   . Coronary artery disease     lateral STEMI 02/22/15   Assessment: 60 yo M admitted 04/07/2015 with SOB. Pt found to be in atrial flutter. Started in IV heparin. Pharmacy now consulted to dose apixaban for long-term anticoagulation   H/H stable, PLT wnl, no bleeding noted   Plan:  apixaban 5mg  BID Discontinue IV heparin   Alize Acy C. Lennox Grumbles, PharmD Pharmacy Resident  Pager: [phone] 04/13/2015 11:23 AM

## 2015-04-13 NOTE — Progress Notes (Addendum)
ANTICOAGULATION CONSULT NOTE - Follow Up Consult  Pharmacy Consult for heparin Indication: atrial fibrillation  No Known Allergies  Patient Measurements: Height: 5\' 7"  (170.2 cm) Weight: 168 lb 6.9 oz (76.4 kg) IBW/kg (Calculated) : 66.1  Vital Signs: Temp: 97.6 F (36.4 C) (02/09 0800) Temp Source: Oral (02/09 0800) BP: 127/72 mmHg (02/09 0800) Pulse Rate: 70 (02/09 0800)  Labs:  Recent Labs  04/11/15 0327 04/12/15 0345 04/13/15 0420  HGB 10.4* 10.0* 9.6*  HCT 32.8* 30.9* 30.0*  PLT 239 249 258  HEPARINUNFRC 0.46 0.43 0.32  CREATININE 0.82 0.72 0.73    Estimated Creatinine Clearance: 89.5 mL/min (by C-G formula based on Cr of 0.73).   Medications:  Infusions:  . sodium chloride    . heparin 1,750 Units/hr (04/12/15 1822)    Assessment: 60 yo M admitted 04/07/2015 with SOB. Pt found to be in atrial flutter. Pharmacy consulted to start heparin. Patient was not on anticoag PTA.   AM HL 0.32 (therapeutic), H/H stable, Plt wnl. No s/sx of bleeding noted.   Goal of Therapy:  Heparin level 0.3-0.7 units/ml Monitor platelets by anticoagulation protocol: Yes   Plan:  - Increase Heparin to 1850 units/hr - Monitor daily HL, CBC and s/sx of bleeding - F/u long term anticoagulation plan  Tyna Huertas C. Lennox Grumbles, PharmD Pharmacy Resident  Pager: [phone] 04/13/2015 9:46 AM

## 2015-04-13 NOTE — Progress Notes (Signed)
Subjective:  Feeling somewhat improved after TEE DCCV to NSR. VSS. Less SOB. No CP  Objective:  Temp:  [97.5 F (36.4 C)-97.8 F (36.6 C)] 97.6 F (36.4 C) (02/09 0800) Pulse Rate:  [61-97] 70 (02/09 0800) Resp:  [19-31] 21 (02/09 0800) BP: (101-127)/(58-90) 127/72 mmHg (02/09 0800) SpO2:  [3 %-100 %] 99 % (02/09 0800) Weight:  [168 lb 6.9 oz (76.4 kg)] 168 lb 6.9 oz (76.4 kg) (02/09 0500) Weight change: 10.6 oz (0.3 kg)  Intake/Output from previous day: 02/08 0701 - 02/09 0700 In: 1340 [P.O.:440; I.V.:900] Out: 1150 [Urine:1150]  Intake/Output from this shift: Total I/O In: 203.5 [P.O.:150; I.V.:53.5] Out: -   Physical Exam: General appearance: alert and no distress Neck: no adenopathy, no carotid bruit, supple, symmetrical, trachea midline, thyroid not enlarged, symmetric, no tenderness/mass/nodules and Mildly elevated JVD Lungs: Decreased BS and crackles bases bilat Heart: regular rate and rhythm, S1, S2 normal, no murmur, click, rub or gallop Extremities: extremities normal, atraumatic, no cyanosis or edema  Lab Results: Results for orders placed or performed during the hospital encounter of 04/07/15 (from the past 48 hour(s))  Heparin level (unfractionated)     Status: None   Collection Time: 04/12/15  3:45 AM  Result Value Ref Range   Heparin Unfractionated 0.43 0.30 - 0.70 IU/mL    Comment:        IF HEPARIN RESULTS ARE BELOW EXPECTED VALUES, AND PATIENT DOSAGE HAS BEEN CONFIRMED, SUGGEST FOLLOW UP TESTING OF ANTITHROMBIN III LEVELS.   CBC     Status: Abnormal   Collection Time: 04/12/15  3:45 AM  Result Value Ref Range   WBC 12.4 (H) 4.0 - 10.5 K/uL   RBC 4.10 (L) 4.22 - 5.81 MIL/uL   Hemoglobin 10.0 (L) 13.0 - 17.0 g/dL   HCT 30.9 (L) 39.0 - 52.0 %   MCV 75.4 (L) 78.0 - 100.0 fL   MCH 24.4 (L) 26.0 - 34.0 pg   MCHC 32.4 30.0 - 36.0 g/dL   RDW 16.5 (H) 11.5 - 15.5 %   Platelets 249 150 - 400 K/uL  Basic metabolic panel     Status: Abnormal   Collection Time: 04/12/15  3:45 AM  Result Value Ref Range   Sodium 134 (L) 135 - 145 mmol/L   Potassium 3.6 3.5 - 5.1 mmol/L   Chloride 97 (L) 101 - 111 mmol/L   CO2 26 22 - 32 mmol/L   Glucose, Bld 92 65 - 99 mg/dL   BUN 7 6 - 20 mg/dL   Creatinine, Ser 0.72 0.61 - 1.24 mg/dL   Calcium 8.4 (L) 8.9 - 10.3 mg/dL   GFR calc non Af Amer >60 >60 mL/min   GFR calc Af Amer >60 >60 mL/min    Comment: (NOTE) The eGFR has been calculated using the CKD EPI equation. This calculation has not been validated in all clinical situations. eGFR's persistently <60 mL/min signify possible Chronic Kidney Disease.    Anion gap 11 5 - 15  Heparin level (unfractionated)     Status: None   Collection Time: 04/13/15  4:20 AM  Result Value Ref Range   Heparin Unfractionated 0.32 0.30 - 0.70 IU/mL    Comment:        IF HEPARIN RESULTS ARE BELOW EXPECTED VALUES, AND PATIENT DOSAGE HAS BEEN CONFIRMED, SUGGEST FOLLOW UP TESTING OF ANTITHROMBIN III LEVELS.   CBC     Status: Abnormal   Collection Time: 04/13/15  4:20 AM  Result Value Ref Range  WBC 12.9 (H) 4.0 - 10.5 K/uL   RBC 3.91 (L) 4.22 - 5.81 MIL/uL   Hemoglobin 9.6 (L) 13.0 - 17.0 g/dL   HCT 30.0 (L) 39.0 - 52.0 %   MCV 76.7 (L) 78.0 - 100.0 fL   MCH 24.6 (L) 26.0 - 34.0 pg   MCHC 32.0 30.0 - 36.0 g/dL   RDW 16.8 (H) 11.5 - 15.5 %   Platelets 258 150 - 400 K/uL  Basic metabolic panel     Status: Abnormal   Collection Time: 04/13/15  4:20 AM  Result Value Ref Range   Sodium 133 (L) 135 - 145 mmol/L   Potassium 3.9 3.5 - 5.1 mmol/L   Chloride 96 (L) 101 - 111 mmol/L   CO2 27 22 - 32 mmol/L   Glucose, Bld 100 (H) 65 - 99 mg/dL   BUN 10 6 - 20 mg/dL   Creatinine, Ser 0.73 0.61 - 1.24 mg/dL   Calcium 8.6 (L) 8.9 - 10.3 mg/dL   GFR calc non Af Amer >60 >60 mL/min   GFR calc Af Amer >60 >60 mL/min    Comment: (NOTE) The eGFR has been calculated using the CKD EPI equation. This calculation has not been validated in all clinical  situations. eGFR's persistently <60 mL/min signify possible Chronic Kidney Disease.    Anion gap 10 5 - 15    Imaging: Imaging results have been reviewed  Tele- NSR 80s (I personally reviewed)  Assessment/Plan:   1. Principal Problem: 2.   Wide-complex tachycardia ([place]) 3. Active Problems: 4.   CAD (coronary artery disease), native coronary artery 5.   Atrial flutter ([place]) 6.   Old lateral wall myocardial infarction 7.   Acute on chronic systolic congestive heart failure ([place]) 8.   LBBB (left bundle branch block) 9.   Time Spent Directly with Patient:  20 minutes  Length of Stay:  LOS: 6 days   POD #1 TEE DCCV Aflutter--->> NSR. Feeling better. VSS. I/O only minimally neg (will increase lasix) although BNP only 300. EF 15-20% with global HK and RV HK as well. Getting PO Amio load. On BB and ACE-I. Will need to start a NOAC. Pharm to assist. Tx tele. Ambulate / [facility]. Hopefully the LV dysfunction will improve (? Tachy mediated CM). Will need MV at some point to assess physiologic significance of remaining LAD and RCA disease.   Curtis Flores 04/13/2015, 10:54 AM

## 2015-04-13 NOTE — Care Management Important Message (Signed)
Important Message  Patient Details  Name: Curtis Flores MRN: [id number] Date of Birth: Dec 11, 1955   Medicare Important Message Given:  Yes    [name] 04/13/2015, 8:02 AM

## 2015-04-13 NOTE — Progress Notes (Signed)
Transferred to 3west room19 by wheelchair, stable, report given to RN, belongings with pt.

## 2015-04-13 NOTE — Progress Notes (Signed)
CARDIAC REHAB PHASE I   PRE:  Rate/Rhythm: 62 SR  BP:  Supine:   Sitting: 106/70  Standing:    SaO2: 100% 2L  MODE:  Ambulation: 210 ft   POST:  Rate/Rhythm: 75 SR  BP:  Supine:   Sitting: 108/66  Standing:    SaO2: 100% 2L 1350-1420 Pt was wobbly with legs a little weak and slightly SOB. Pt stated he had not walked in almost a week. Pt walked 210 ft on 2L with gait belt use and asst x 1 with fairly steady gait. Tired easily. To recliner after walk. Remained in NSR. Left on 2L. .Call bell in reach. Left CHF booklet in room.   Graylon Good, RN BSN  04/13/2015 2:14 PM

## 2015-04-14 LAB — CBC
HCT: 32.6 % — ABNORMAL LOW (ref 39.0–52.0)
Hemoglobin: 10.1 g/dL — ABNORMAL LOW (ref 13.0–17.0)
MCH: 23.7 pg — ABNORMAL LOW (ref 26.0–34.0)
MCHC: 31 g/dL (ref 30.0–36.0)
MCV: 76.5 fL — ABNORMAL LOW (ref 78.0–100.0)
PLATELETS: 287 10*3/uL (ref 150–400)
RBC: 4.26 MIL/uL (ref 4.22–5.81)
RDW: 16.8 % — AB (ref 11.5–15.5)
WBC: 12.4 10*3/uL — AB (ref 4.0–10.5)

## 2015-04-14 MED ORDER — FUROSEMIDE 40 MG PO TABS
40.0000 mg | ORAL_TABLET | Freq: Two times a day (BID) | ORAL | Status: DC
  Administered 2015-04-14 – 2015-04-15 (×2): 40 mg via ORAL
  Filled 2015-04-14 (×2): qty 1

## 2015-04-14 NOTE — Progress Notes (Signed)
Subjective:  Feeling clinically improved. Less SOB since DCCV. Maintaining NSR  Objective:  Temp:  [97.5 F (36.4 C)-98.2 F (36.8 C)] 97.5 F (36.4 C) (02/10 1459) Pulse Rate:  [62-78] 62 (02/10 1459) Resp:  [16-25] 16 (02/10 1459) BP: (96-135)/(50-82) 105/52 mmHg (02/10 1459) SpO2:  [96 %-100 %] 100 % (02/10 1459) Weight:  [164 lb 6.4 oz (74.571 kg)] 164 lb 6.4 oz (74.571 kg) (02/10 0419) Weight change: -4 lb 0.5 oz (-1.829 kg)  Intake/Output from previous day: 02/09 0701 - 02/10 0700 In: 665 [P.O.:590; I.V.:75] Out: 250 [Urine:250]  Intake/Output from this shift: Total I/O In: 480 [P.O.:480] Out: 550 [Urine:550]  Physical Exam: General appearance: alert and no distress Neck: no adenopathy, no carotid bruit, no JVD, supple, symmetrical, trachea midline and thyroid not enlarged, symmetric, no tenderness/mass/nodules Lungs: clear to auscultation bilaterally Heart: regular rate and rhythm, S1, S2 normal, no murmur, click, rub or gallop Extremities: extremities normal, atraumatic, no cyanosis or edema  Lab Results: Results for orders placed or performed during the hospital encounter of 04/07/15 (from the past 48 hour(s))  Heparin level (unfractionated)     Status: None   Collection Time: 04/13/15  4:20 AM  Result Value Ref Range   Heparin Unfractionated 0.32 0.30 - 0.70 IU/mL    Comment:        IF HEPARIN RESULTS ARE BELOW EXPECTED VALUES, AND PATIENT DOSAGE HAS BEEN CONFIRMED, SUGGEST FOLLOW UP TESTING OF ANTITHROMBIN III LEVELS.   CBC     Status: Abnormal   Collection Time: 04/13/15  4:20 AM  Result Value Ref Range   WBC 12.9 (H) 4.0 - 10.5 K/uL   RBC 3.91 (L) 4.22 - 5.81 MIL/uL   Hemoglobin 9.6 (L) 13.0 - 17.0 g/dL   HCT 30.0 (L) 39.0 - 52.0 %   MCV 76.7 (L) 78.0 - 100.0 fL   MCH 24.6 (L) 26.0 - 34.0 pg   MCHC 32.0 30.0 - 36.0 g/dL   RDW 16.8 (H) 11.5 - 15.5 %   Platelets 258 150 - 400 K/uL  Basic metabolic panel     Status: Abnormal   Collection  Time: 04/13/15  4:20 AM  Result Value Ref Range   Sodium 133 (L) 135 - 145 mmol/L   Potassium 3.9 3.5 - 5.1 mmol/L   Chloride 96 (L) 101 - 111 mmol/L   CO2 27 22 - 32 mmol/L   Glucose, Bld 100 (H) 65 - 99 mg/dL   BUN 10 6 - 20 mg/dL   Creatinine, Ser 0.73 0.61 - 1.24 mg/dL   Calcium 8.6 (L) 8.9 - 10.3 mg/dL   GFR calc non Af Amer >60 >60 mL/min   GFR calc Af Amer >60 >60 mL/min    Comment: (NOTE) The eGFR has been calculated using the CKD EPI equation. This calculation has not been validated in all clinical situations. eGFR's persistently <60 mL/min signify possible Chronic Kidney Disease.    Anion gap 10 5 - 15  CBC     Status: Abnormal   Collection Time: 04/14/15  5:40 AM  Result Value Ref Range   WBC 12.4 (H) 4.0 - 10.5 K/uL   RBC 4.26 4.22 - 5.81 MIL/uL   Hemoglobin 10.1 (L) 13.0 - 17.0 g/dL   HCT 32.6 (L) 39.0 - 52.0 %   MCV 76.5 (L) 78.0 - 100.0 fL   MCH 23.7 (L) 26.0 - 34.0 pg   MCHC 31.0 30.0 - 36.0 g/dL   RDW 16.8 (H) 11.5 - 15.5 %  Platelets 287 150 - 400 K/uL    Imaging: Imaging results have been reviewed  Tele- NSR 80s (I personally reviewed)  Assessment/Plan:   1. Principal Problem: 2.   Wide-complex tachycardia ([place]) 3. Active Problems: 4.   CAD (coronary artery disease), native coronary artery 5.   Atrial flutter ([place]) 6.   Old lateral wall myocardial infarction 7.   Acute on chronic systolic congestive heart failure ([place]) 8.   LBBB (left bundle branch block) 9.   Time Spent Directly with Patient:  15 minutes  Length of Stay:  LOS: 7 days    Maintaining NSR on Amio load, coreg and Eliquis. I/O minimally neg. Lungs clear. No increased JVP or periph edema. Ambulated with [facility]. Labs OK. Will transition to PO lasix. Prob home AM. TOC 7 then ROV with me 3-4 weeks.  Quay Burow 04/14/2015, 3:38 PM

## 2015-04-14 NOTE — Care Management Note (Signed)
Case Management Note  Patient Details  Name: Curtis Flores MRN: [id number] Date of Birth: [birth year]  Subjective/Objective:   Pt is a transfer from [facility] H. Plan will be for d/c home once stable on Eliquis. Pt is from home with wife. No Rx insurance at this time. CM did place patient assistance form on the shadow chart. MD please fill out. Staff RN to provide to patient once d/c is set.                 Action/Plan: CM did make family aware that he will need to fax assistance form as soon as possible along with tax forms. No further needs from CM at this time.    Expected Discharge Date:                  Expected Discharge Plan:  [place]  In-House Referral:  NA  Discharge planning Services  CM Consult, Medication Assistance  Post Acute Care Choice:  NA Choice offered to:  NA  DME Arranged:  N/A DME Agency:  NA  HH Arranged:  NA HH Agency:  NA  Status of Service:  Completed, signed off  Medicare Important Message Given:  Yes Date Medicare IM Given:    Medicare IM give by:    Date Additional Medicare IM Given:    Additional Medicare Important Message give by:     If discussed at [place] of Stay Meetings, dates discussed:    Additional Comments:  Bethena Roys, RN 04/14/2015, 1:37 PM

## 2015-04-14 NOTE — Discharge Instructions (Signed)

## 2015-04-14 NOTE — Progress Notes (Signed)
UR Completed Treacy Holcomb Graves-Bigelow, RN,BSN [phone]  

## 2015-04-14 NOTE — Progress Notes (Signed)
CARDIAC REHAB PHASE I   PRE:  Rate/Rhythm: 57 SR c/ PVCs  BP:  Sitting: 105/62        SaO2: 100 RA  MODE:  Ambulation: 350 ft   POST:  Rate/Rhythm: 68 SR  BP:  Sitting: 125/52         SaO2: 100 RA  Pt ambulated 350 ft on RA, gait belt, assist x1, mildly unsteady gait, tolerated well. Pt reports mild DOE, states it is much improved, denies any other symptoms, declined rest stop. Completed CHF education with pt and wife at bedside. Reviewed CHF booklet and zone tool, off the beat book, daily weights, heart healthy diet, sodium and fluid restrictions, exercise guidelines and phase 2 cardiac rehab. Pt and wife verbalized understanding, had many diet related questions, receptive to education. Pt was referred to [facility] in December 2016 but never started the program as he states he did not feel well enough. Pt still interested in program, will send referral to [facility] per pt request. Pt to recliner after walk, feet elevated, call bell within reach. Will follow.  [id number]  Lenna Sciara, RN, BSN 04/14/2015 2:52 PM

## 2015-04-15 ENCOUNTER — Encounter (HOSPITAL_COMMUNITY): Payer: Self-pay | Admitting: Internal Medicine

## 2015-04-15 DIAGNOSIS — I483 Typical atrial flutter: Secondary | ICD-10-CM

## 2015-04-15 LAB — CBC
HCT: 31.2 % — ABNORMAL LOW (ref 39.0–52.0)
Hemoglobin: 10.1 g/dL — ABNORMAL LOW (ref 13.0–17.0)
MCH: 25 pg — ABNORMAL LOW (ref 26.0–34.0)
MCHC: 32.4 g/dL (ref 30.0–36.0)
MCV: 77.2 fL — ABNORMAL LOW (ref 78.0–100.0)
Platelets: 288 10*3/uL (ref 150–400)
RBC: 4.04 MIL/uL — ABNORMAL LOW (ref 4.22–5.81)
RDW: 17.1 % — AB (ref 11.5–15.5)
WBC: 11.7 10*3/uL — ABNORMAL HIGH (ref 4.0–10.5)

## 2015-04-15 MED ORDER — FUROSEMIDE 40 MG PO TABS: 40.0000 mg | ORAL_TABLET | Freq: Two times a day (BID) | ORAL | Status: DC

## 2015-04-15 MED ORDER — CLOPIDOGREL BISULFATE 75 MG PO TABS: 75.0000 mg | ORAL_TABLET | Freq: Every day | ORAL | Status: DC

## 2015-04-15 MED ORDER — AMIODARONE HCL 200 MG PO TABS: 200.0000 mg | ORAL_TABLET | Freq: Every day | ORAL | Status: DC

## 2015-04-15 MED ORDER — APIXABAN 5 MG PO TABS: 5.0000 mg | ORAL_TABLET | Freq: Two times a day (BID) | ORAL | Status: DC

## 2015-04-15 NOTE — Progress Notes (Signed)
Patient being discharged home per MD order, All discharge instructions given to patient and family, along with printed prescriptions.

## 2015-04-15 NOTE — Progress Notes (Signed)
CCMD requested charge RN verification of strip posted to chart for apparent rhythm change. Strip reviewed, shows Mobitz II (classic) AV block with rate of 44 at 00:40.    Patient is currently asymptomatic; sleeping. Recent vital signs are stable:   Filed Vitals:   04/14/15 1000 04/14/15 1459 04/14/15 1714 04/14/15 2015  BP: 135/82 105/52 130/73 108/68  Pulse: 78 62 72 62  Temp: 97.8 F (36.6 C) 97.5 F (36.4 C)  97.5 F (36.4 C)  TempSrc: Oral Oral  Oral  Resp: 18 16  18   Height:      Weight:      SpO2: 99% 100%  97%    Will communicate to primary RN and continue to follow for issues.

## 2015-04-15 NOTE — Discharge Summary (Signed)
Discharge Summary    Patient ID: Curtis Flores,  MRN: [id number], DOB/AGE: Jun 27, 1955 60 y.o.  Admit date: 04/07/2015 Discharge date: 04/15/2015  Primary Care Provider: [name] Primary Cardiologist: Dr. [name] EP: Tashema Tiller  Discharge Diagnoses    Principal Problem:   Wide-complex tachycardia [facility]) Active Problems:   Acute on chronic systolic congestive heart failure (HCC)   CAD (coronary artery disease), native coronary artery   Atrial flutter (HCC)   Old lateral wall myocardial infarction   Acute on chronic systolic congestive heart failure (HCC)   LBBB (left bundle branch block)   Allergies No Known Allergies  Diagnostic Studies/Procedures    Transthoracic Echocardiography 04/10/15 LV EF: 20%  ------------------------------------------------------------------- Indications:   Atrial flutter 427.32.  ------------------------------------------------------------------- History:  PMH:  Coronary artery disease. Risk factors: Former tobacco use. Hypertension.  ------------------------------------------------------------------- Study Conclusions  - Left ventricle: The cavity size was normal. Wall thickness was increased in a pattern of mild LVH. Systolic function was severely reduced. The estimated ejection fraction was 20%. Diffuse hypokinesis and inferior, inferoseptal, septal and apical akinesis. The study is not technically sufficient to allow evaluation of LV diastolic function. - Mitral valve: Mildly thickened leaflets . There was mild regurgitation. - Left atrium: Moderately dilated at 41 ml/m2. - Right ventricle: The cavity size was mildly dilated. Systolic function is moderately reduced. Lateral annulus peak S velocity: 5.87 cm/s. - Right atrium: The atrium was mildly dilated. - Tricuspid valve: There was mild regurgitation. - Pulmonary arteries: PA peak pressure: 40 mm Hg (S). - Inferior vena cava: The vessel was dilated.  The respirophasic diameter changes were blunted (< 50%), consistent with elevated central venous pressure.  Impressions:  - Compared to a prior study in 02/2015, the EF has further reduced to <20%.  TEE 04/12/15 Mildly dilated LV with EF 15-20%, diffuse hypokinesis. The RV was mildly dilated and moderately hypokinetic. There was trivial TR, trivial MR. Trileaflet aortic valve with no stenosis or regurgitation. Mildly dilated LA with no LAA thrombus. No ASD/PFO, negative bubble study. Normal caliber aorta with grade III plaque in the descending thoracic aorta.  Electrical Cardioversion 04/12/15 Patient placed on cardiac monitor, pulse oximetry, supplemental oxygen as necessary.  Sedation given: Per anesthesiology Pacer pads placed anterior and posterior chest.  Cardioverted 1 time(s).  Cardioverted at 150J.  Evaluation Findings: Post procedure EKG shows: NSR Complications: None Patient did tolerate procedure well.   History of Present Illness     This 60 year old black male had an acute lateral wall infarction on December 21. He had three-vessel disease and underwent T stenting of the rocks will circumflex and first marginal branch by Dr. Alvester Chou. His ejection fraction was around 35-40% on discharge. He has been dizzy and short of breath since the procedure and has been very weak. He has found it difficult to walk and had increasing dyspnea. He was seen by his primary doctor for bronchitis with increasing shortness of breath cough and was treated with Levaquin. He saw Dr. Alvester Chou 2 days ago 04/05/15 and had a loud gallop noted and his heart rate was noted to be 120. It was thought at that time to be sinus tachycardia but in retrospect was probably atrial flutter. He was [place] the [employer] [place] 04/07/15 and had the onset of severe dyspnea after getting out of the chair and the ambulance was called. He was found to be in a wide-complex tachycardia at a very rapid rate of over 200 and  evidently was given a single bolus  of adenosine and then a repeat bolus as well as diltiazem and his heart rate slowed to 120. On arrival here he has had a continued heart rate of 120 mild elevation of BNP. He continues to be significantly short of breath but has not had ischemic chest pain. He simply feels bad. He has not had syncope. He has had a mild amount of edema.   Hospital Course     Consultants: EP  He was in atrial flutter during admission. Amiodarone initiated on arrival to help with rate control and anticoagulated with  IV heparin. s/p TEE DCCV on 2/8 maintained NS.  change to 200mg  BID after 7 day of 400mg  BID amio.  Echo 04/10/2015 EF 20%, diffuse hypokinesis and inferior, inferoseptal, septal and apical akinesis. Previous echo on 02/23/2015 showed EF 35-40%. Hopefully the LV dysfunction will improve (? Tachy mediated CM).  Will need MV at some point to assess physiologic significance of remaining LAD and RCA disease.   The patient has been seen by Dr. Rayann Heman today and deemed ready for discharge home. Follow-up in AF clinic in 2-3 days and plan for outpatient ablation. Reduce amiodarone to 200mg  daily. Despite pulmonary fibrosis and prior lung issues, he was placed on amiodarone. Continue eliquis for chads2vasc score of at least 3. Stop ASA and Brillinta. Continue plavix. F/u with Dr. Rayann Heman 4 weeks.  All follow-up appointments have been scheduled. Discharge medications are listed below.   Discharge Vitals Blood pressure 188/68, pulse 64, temperature 97.4 F (36.3 C), temperature source Oral, resp. rate 18, height 5\' 7"  (1.702 m), weight 165 lb 6.4 oz (75.025 kg), SpO2 96 %.  Filed Weights   04/13/15 0500 04/14/15 0419 04/15/15 0510  Weight: 168 lb 6.9 oz (76.4 kg) 164 lb 6.4 oz (74.571 kg) 165 lb 6.4 oz (75.025 kg)    Labs & Radiologic Studies     CBC  Recent Labs  04/14/15 0540 04/15/15 0528  WBC 12.4* 11.7*  HGB 10.1* 10.1*  HCT 32.6* 31.2*  MCV 76.5* 77.2*    PLT 287 [id number]   Basic Metabolic Panel  Recent Labs  04/13/15 0420  NA 133*  K 3.9  CL 96*  CO2 27  GLUCOSE 100*  BUN 10  CREATININE 0.73  CALCIUM 8.6*     Dg Chest Portable 1 View  04/07/2015  CLINICAL DATA:  Shortness of breath. EXAM: PORTABLE CHEST 1 VIEW COMPARISON:  February 22, 2015. FINDINGS: Stable cardiomegaly. No pneumothorax or significant pleural effusion is noted. Stable bibasilar interstitial densities are noted most consistent with fibrosis or atelectasis. Bony thorax is intact. IMPRESSION: Stable bibasilar interstitial densities are noted most consistent with fibrosis or possibly atelectasis. No significant changes noted compared to prior exam. Electronically Signed   By: Marijo Conception, M.D.   On: 04/07/2015 18:20    Disposition   Pt is being discharged home today in good condition.  Follow-up Plans & Appointments    Follow-up Information    Follow up with [name], MD.   Specialty:  Cardiology   Why:  office will call with appoinement   Contact information:   [address] [place] [address] [phone]      Discharge Instructions    Amb Referral to Cardiac Rehabilitation    Complete by:  As directed   Diagnosis:  Heart Failure (see criteria below)  Heart Failure Type:  Chronic Systolic     Diet - low sodium heart healthy    Complete by:  As  directed      Discharge instructions    Complete by:  As directed   No work until seen in clinic next week. Stop ASA and brillina. Will start plavix and eliquis. Take protonix.     Increase activity slowly    Complete by:  As directed            Discharge Medications   Current Discharge Medication List    START taking these medications   Details  amiodarone (PACERONE) 200 MG tablet Take 1 tablet (200 mg total) by mouth daily. Qty: 30 tablet, Refills: 3    apixaban (ELIQUIS) 5 MG TABS tablet Take 1 tablet (5 mg total) by mouth 2 (two) times daily. Qty: 60 tablet, Refills: 6     clopidogrel (PLAVIX) 75 MG tablet Take 1 tablet (75 mg total) by mouth daily. Qty: 30 tablet, Refills: 6    furosemide (LASIX) 40 MG tablet Take 1 tablet (40 mg total) by mouth 2 (two) times daily. Qty: 60 tablet, Refills: 3      CONTINUE these medications which have NOT CHANGED   Details  Apremilast (OTEZLA) 30 MG TABS Take 1 tablet by mouth 2 (two) times daily.    atorvastatin (LIPITOR) 80 MG tablet Take 1 tablet (80 mg total) by mouth daily at 6 PM. Qty: 30 tablet, Refills: 6    carvedilol (COREG) 12.5 MG tablet Take 1.5 tablets (18.75 mg total) by mouth 2 (two) times daily. Qty: 60 tablet, Refills: 3    guaiFENesin (MUCINEX) 600 MG 12 hr tablet Take 600 mg by mouth 2 (two) times daily.    leflunomide (ARAVA) 20 MG tablet Take 20 mg by mouth daily.    Associated Diagnoses: Leukocytosis    lisinopril (PRINIVIL,ZESTRIL) 5 MG tablet TAKE 1 TABLET (5 MG TOTAL) BY MOUTH DAILY. Qty: 30 tablet, Refills: 1    pantoprazole (PROTONIX) 40 MG tablet Take 1 tablet (40 mg total) by mouth daily. Qty: 30 tablet, Refills: 11      STOP taking these medications     aspirin 81 MG chewable tablet      esomeprazole (NEXIUM) 40 MG capsule      hydrochlorothiazide (MICROZIDE) 12.5 MG capsule      levofloxacin (LEVAQUIN) 500 MG tablet      ticagrelor (BRILINTA) 90 MG TABS tablet            Outstanding Labs/Studies   None  Duration of Discharge Encounter   Greater than 30 minutes including physician time.  Signed, Bhagat,Bhavinkumar PA-C 04/15/2015, 12:10 PM    Trude Mcburney

## 2015-04-15 NOTE — Consult Note (Signed)
ELECTROPHYSIOLOGY CONSULT NOTE    Primary Care Physician: [name], MD Referring Physician:  Dr [name]  Admit Date: 04/07/2015  Reason for consultation:  Atrial flutter  Curtis Flores is a 60 y.o. male with a h/o CAD, ischemic CM, and LBBB admitted with symptomatic atrial flutter.  He presented with tachypalpitations and wide complex tachycardia.  Per report, he received adenosine (no strips available on chart for me to review) which revealed underlying atrial flutter.  With better rate control, he was demonstrated to have typical appearing atrial flutter. He was placed on amiodarone and then cardioverted.  He is now in sinus rhythm and feels "much better".  Ready to go home from the hospital. Echo reveals EF now 20% (previously 35-40%).  He is s/p PCI 12/16.   Denies ischemic symptoms presently.  Today, he denies symptoms of palpitations, chest pain, shortness of breath, orthopnea, PND, lower extremity edema, dizziness, presyncope, syncope, or neurologic sequela. The patient is tolerating medications without difficulties and is otherwise without complaint today.   Past Medical History  Diagnosis Date  . Psoriasis 2003  . Rheumatoid arthritis(714.0) 2012  . Psoriatic arthritis ([place])   . Leukocytosis   . Lymphadenopathy   . Pulmonary fibrosis ([place])   . Hypertension   . GERD (gastroesophageal reflux disease)   . Hyperlipidemia   . Coronary artery disease     lateral STEMI 02/22/15   Past Surgical History  Procedure Laterality Date  . Cardiac catheterization N/A 02/22/2015    Procedure: Left Heart Cath and Coronary Angiography;  Surgeon: Lorretta Harp, MD;  Location: [facility];  Service: Cardiovascular;  Laterality: N/A;  . Cardiac catheterization N/A 02/22/2015    Procedure: Coronary Stent Intervention;  Surgeon: Lorretta Harp, MD;  Location: [facility];  Service: Cardiovascular;  Laterality: N/A;  . Tee without cardioversion N/A 04/12/2015   Procedure: TRANSESOPHAGEAL ECHOCARDIOGRAM (TEE);  Surgeon: Larey Dresser, MD;  Location: [facility];  Service: Cardiovascular;  Laterality: N/A;  . Cardioversion N/A 04/12/2015    Procedure: CARDIOVERSION;  Surgeon: Larey Dresser, MD;  Location: [facility] ENDOSCOPY;  Service: Cardiovascular;  Laterality: N/A;    . amiodarone  400 mg Oral BID  . antiseptic oral rinse  7 mL Mouth Rinse BID  . apixaban  5 mg Oral BID  . Apremilast  1 tablet Oral BID  . atorvastatin  80 mg Oral q1800  . carvedilol  12.5 mg Oral BID WC  . clopidogrel  75 mg Oral Daily  . furosemide  40 mg Oral BID  . leflunomide  20 mg Oral Daily  . lisinopril  5 mg Oral Daily  . pantoprazole  40 mg Oral Daily  . potassium chloride  20 mEq Oral Daily  . sodium chloride flush  3 mL Intravenous Q12H   . sodium chloride      No Known Allergies  Social History   Social History  . Marital Status: Married    Spouse Name: N/A  . Number of Children: 2  . Years of Education: N/A   Occupational History  . unemployed     not working do to arthritis; used to be Patent attorney for a [employer] History Main Topics  . Smoking status: Former Smoker -- 1.00 packs/day for 30 years    Types: Cigarettes    Quit date: 03/21/2003  . Smokeless tobacco: Never Used  . Alcohol Use: No     Comment: H/O case of beer weekly  x 20 years, quiting in 2000-ish.  . Drug Use: No     Comment: H/O marijuana use many years ago.  [name] Sexual Activity: Yes    Birth Control/ Protection: None   Other Topics Concern  . Not on file   Social History Narrative    Family History  Problem Relation Age of Onset  . Hypertension Mother     ROS- All systems are reviewed and negative except as per the HPI above  Physical Exam: Telemetry: presently sinus rhythm Filed Vitals:   04/14/15 2015 04/15/15 0510 04/15/15 0750 04/15/15 0853  BP: 108/68 121/70 118/68 188/68  Pulse: 62 53 64 64  Temp: 97.5 F (36.4 C) 97.4 F (36.3 C)      TempSrc: Oral Oral    Resp: 18 16 18    Height:      Weight:  165 lb 6.4 oz (75.025 kg)    SpO2: 97% 98% 96%     GEN- The patient is well appearing, alert and oriented x 3 today.   Head- normocephalic, atraumatic Eyes-  Sclera clear, conjunctiva pink Ears- hearing intact Oropharynx- clear Neck- supple, no JVP Lymph- no cervical lymphadenopathy Lungs- Clear to ausculation bilaterally, normal work of breathing Heart- Regular rate and rhythm, no murmurs, rubs or gallops, PMI not laterally displaced GI- soft, NT, ND, + BS Extremities- no clubbing, cyanosis, or edema MS- no significant deformity or atrophy Skin- no rash or lesion Psych- euthymic mood, full affect Neuro- strength and sensation are intact  EKGs are reviewed and reveal typical atrial flutter, LBBB  Labs:   Lab Results  Component Value Date   WBC 11.7* 04/15/2015   HGB 10.1* 04/15/2015   HCT 31.2* 04/15/2015   MCV 77.2* 04/15/2015   PLT 288 04/15/2015     Recent Labs Lab 04/13/15 0420  NA 133*  K 3.9  CL 96*  CO2 27  BUN 10  CREATININE 0.73  CALCIUM 8.6*  GLUCOSE 100*   Lab Results  Component Value Date   TROPONINI 0.07* 04/07/2015    Lab Results  Component Value Date   CHOL 80 02/22/2015   Lab Results  Component Value Date   HDL 20* 02/22/2015   Lab Results  Component Value Date   LDLCALC 48 02/22/2015   Lab Results  Component Value Date   TRIG 61 02/22/2015   Lab Results  Component Value Date   CHOLHDL 4.0 02/22/2015   Echo reveals EF 20% Recent cath reviewed  ASSESSMENT AND PLAN:   1. Typical atrial  Flutter The patient presented with rapidly conducting atrial flutter.  Despite pulmonary fibrosis and prior lung issues, he was placed on amiodarone.  He was cardioverted and is now maintaining sinus with eliquis. Therapeutic strategies for atrial flutter including medicine and ablation were discussed in detail with the patient today. Risk, benefits, and alternatives to EP study  and radiofrequency ablation were also discussed in detail today.  He is clear that he would like to have ablation electively in order to come off of anticoagulation long term.  He wishes to go home and return for ablation as an outpatient. Reduce amiodarone to 200mg  daily Continue eliquis for chads2vasc score of at least 3 Stop ASA Follow-up in AF clinic in 2-3 days I will see in 4 weeks My office to arrange ablation with anesthesia at next available time.  2. CAD Recent PCI No ischemic symptoms Stop ASA Continue eliquis and plavix Hopefully post ablation we can stop eliquis eventually and return to ASA  and plavix long term  3. Ischemic CM/ chronic systolic dysfunction/ LBBB Continue to optimize medicines as an outpatient Hopefully EF will improve with sinus rhythm (though I doubt that it will normalize--> prior EF 40%)  DC to home today   [name], MD 04/15/2015  11:49 AM

## 2015-04-15 NOTE — Progress Notes (Signed)
CARDIAC REHAB PHASE I   PRE:  Rate/Rhythm: 53 SB  BP:  Sitting: 90/62      SaO2: 99 RA  MODE:  Ambulation: 450 ft   POST:  Rate/Rhythm: 70 SR  BP:  Sitting: 114/62     SaO2: 97 RA [id number] Patient ambulated in hallway independently. Steady gait noted. Denied complaints. Questionable D/C home today. Post ambulation patient back to chair with call bell and phone in reach. Denied need for education review. Referral letter will be sent to [facility] at discharge.  Santina Evans, BSN 04/15/2015 9:54 AM

## 2015-04-17 ENCOUNTER — Telehealth: Payer: Self-pay | Admitting: Cardiovascular Disease

## 2015-04-17 NOTE — Telephone Encounter (Signed)
New Message  Pt calling to speak w/ RN concerning post ED f/u sched 2/16

## 2015-04-17 NOTE — Telephone Encounter (Signed)
Returned call. Pt wants to reschedule APP appointment. Sent to scheduling.

## 2015-04-19 ENCOUNTER — Telehealth: Payer: Self-pay | Admitting: Pulmonary Disease

## 2015-04-19 ENCOUNTER — Ambulatory Visit (INDEPENDENT_AMBULATORY_CARE_PROVIDER_SITE_OTHER): Payer: Medicare Other | Admitting: Pulmonary Disease

## 2015-04-19 ENCOUNTER — Encounter: Payer: Self-pay | Admitting: Pulmonary Disease

## 2015-04-19 ENCOUNTER — Ambulatory Visit (HOSPITAL_COMMUNITY)
Admission: RE | Admit: 2015-04-19 | Discharge: 2015-04-19 | Disposition: A | Discharge status: Home / Self Care | Payer: Medicare Other | Source: Ambulatory Visit | Attending: Nurse Practitioner | Admitting: Nurse Practitioner

## 2015-04-19 ENCOUNTER — Encounter (HOSPITAL_COMMUNITY): Payer: Self-pay | Admitting: Nurse Practitioner

## 2015-04-19 VITALS — BP 124/60 | HR 65 | Ht 67.0 in | Wt 170.8 lb

## 2015-04-19 VITALS — BP 120/60 | HR 57 | Ht 67.0 in | Wt 169.4 lb

## 2015-04-19 DIAGNOSIS — Z87891 Personal history of nicotine dependence: Secondary | ICD-10-CM | POA: Insufficient documentation

## 2015-04-19 DIAGNOSIS — Z7902 Long term (current) use of antithrombotics/antiplatelets: Secondary | ICD-10-CM | POA: Insufficient documentation

## 2015-04-19 DIAGNOSIS — I2121 ST elevation (STEMI) myocardial infarction involving left circumflex coronary artery: Secondary | ICD-10-CM

## 2015-04-19 DIAGNOSIS — Z79899 Other long term (current) drug therapy: Secondary | ICD-10-CM | POA: Diagnosis not present

## 2015-04-19 DIAGNOSIS — I10 Essential (primary) hypertension: Secondary | ICD-10-CM | POA: Diagnosis not present

## 2015-04-19 DIAGNOSIS — Z955 Presence of coronary angioplasty implant and graft: Secondary | ICD-10-CM | POA: Diagnosis not present

## 2015-04-19 DIAGNOSIS — J8410 Pulmonary fibrosis, unspecified: Secondary | ICD-10-CM | POA: Insufficient documentation

## 2015-04-19 DIAGNOSIS — I2510 Atherosclerotic heart disease of native coronary artery without angina pectoris: Secondary | ICD-10-CM | POA: Insufficient documentation

## 2015-04-19 DIAGNOSIS — I4892 Unspecified atrial flutter: Secondary | ICD-10-CM | POA: Diagnosis not present

## 2015-04-19 DIAGNOSIS — I252 Old myocardial infarction: Secondary | ICD-10-CM | POA: Insufficient documentation

## 2015-04-19 DIAGNOSIS — E785 Hyperlipidemia, unspecified: Secondary | ICD-10-CM | POA: Diagnosis not present

## 2015-04-19 DIAGNOSIS — M069 Rheumatoid arthritis, unspecified: Secondary | ICD-10-CM | POA: Diagnosis not present

## 2015-04-19 DIAGNOSIS — Z8249 Family history of ischemic heart disease and other diseases of the circulatory system: Secondary | ICD-10-CM | POA: Diagnosis not present

## 2015-04-19 DIAGNOSIS — K219 Gastro-esophageal reflux disease without esophagitis: Secondary | ICD-10-CM | POA: Insufficient documentation

## 2015-04-19 DIAGNOSIS — L4050 Arthropathic psoriasis, unspecified: Secondary | ICD-10-CM | POA: Insufficient documentation

## 2015-04-19 MED ORDER — CARVEDILOL 12.5 MG PO TABS: 12.5000 mg | ORAL_TABLET | Freq: Two times a day (BID) | ORAL | Status: DC

## 2015-04-19 MED ORDER — LOSARTAN POTASSIUM 50 MG PO TABS: 50.0000 mg | ORAL_TABLET | Freq: Every day | ORAL | Status: DC

## 2015-04-19 NOTE — Progress Notes (Addendum)
Patient ID: Curtis Flores, male   DOB: [birth year], 60 y.o.   MRN: [id number]     Primary Care Physician: [name], MD Referring Physician: [facility] f/u Electrophysiologist: Dr. Rayann Heman Cardiologist: Dr. Zettie Pho is a 60 y.o. 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 He had an URI and was being treated with Levaquin. He had a wide complex tachycardia on admission(h/o LBBB) and rhythm  thought to represent aflutter. He was started on amiodarone IV/heparing and then changed to eliquis bid. He is currently on amiodarone 200 mg qd and is in  SR with LBBB today. He does have h/o pulmonary fibrosis and will see pulmonologist later today.   He currently feels better. No further dizziness, chest pain, some mild shortness of breath. He is being compliant with meds, misunderstood carvedilol instructions and has only been taking 1/2 of 12.5 mg tab instead of 11/2 tabs bid.   Today, he denies symptoms of palpitations, chest pain, shortness of breath, orthopnea, PND, lower extremity edema, dizziness, presyncope, syncope, or neurologic sequela. The patient is tolerating medications without difficulties and is otherwise without complaint today.   Past Medical History  Diagnosis Date  . Psoriasis 2003  . Rheumatoid arthritis(714.0) 2012  . Psoriatic arthritis ([place])   . Leukocytosis   . Lymphadenopathy   . Pulmonary fibrosis ([place])   . Hypertension   . GERD (gastroesophageal reflux disease)   . Hyperlipidemia   . Coronary artery disease     lateral STEMI 02/22/15  . Typical atrial flutter ([place])   . LBBB (left bundle branch block)   . Ischemic cardiomyopathy   . Chronic systolic dysfunction of left ventricle    Past Surgical History  Procedure Laterality Date  . Cardiac catheterization N/A 02/22/2015    Procedure: Left Heart Cath and Coronary Angiography;   Surgeon: Lorretta Harp, MD;  Location: [place] [facility];  Service: Cardiovascular;  Laterality: N/A;  . Cardiac catheterization N/A 02/22/2015    Procedure: Coronary Stent Intervention;  Surgeon: Lorretta Harp, MD;  Location: [place] [facility];  Service: Cardiovascular;  Laterality: N/A;  . Tee without cardioversion N/A 04/12/2015    Procedure: TRANSESOPHAGEAL ECHOCARDIOGRAM (TEE);  Surgeon: Larey Dresser, MD;  Location: [place];  Service: Cardiovascular;  Laterality: N/A;  . Cardioversion N/A 04/12/2015    Procedure: CARDIOVERSION;  Surgeon: Larey Dresser, MD;  Location: [place];  Service: Cardiovascular;  Laterality: N/A;    Current Outpatient Prescriptions  Medication Sig Dispense Refill  . amiodarone (PACERONE) 200 MG tablet Take 1 tablet (200 mg total) by mouth daily. 30 tablet 3  . apixaban (ELIQUIS) 5 MG TABS tablet Take 1 tablet (5 mg total) by mouth 2 (two) times daily. 60 tablet 6  . Apremilast (OTEZLA) 30 MG TABS Take 1 tablet by mouth 2 (two) times daily.    [name] atorvastatin (LIPITOR) 80 MG tablet Take 1 tablet (80 mg total) by mouth daily at 6 PM. 30 tablet 6  . carvedilol (COREG) 12.5 MG tablet Take 1 tablet (12.5 mg total) by mouth 2 (two) times daily. 60 tablet 3  . clopidogrel (PLAVIX) 75 MG tablet Take 1 tablet (75 mg total) by mouth daily. 30 tablet 6  . furosemide (LASIX) 40 MG tablet Take 1 tablet (40 mg total) by mouth 2 (two) times daily. 60 tablet 3  . guaiFENesin (MUCINEX) 600  MG 12 hr tablet Take 600 mg by mouth 2 (two) times daily.    [name] leflunomide (ARAVA) 20 MG tablet Take 20 mg by mouth daily.     [name] lisinopril (PRINIVIL,ZESTRIL) 5 MG tablet TAKE 1 TABLET (5 MG TOTAL) BY MOUTH DAILY. 30 tablet 1  . pantoprazole (PROTONIX) 40 MG tablet Take 1 tablet (40 mg total) by mouth daily. 30 tablet 11   No current facility-administered medications for this encounter.    No Known Allergies  Social History   Social History  . Marital Status: Married     Spouse Name: N/A  . Number of Children: 2  . Years of Education: N/A   Occupational History  . unemployed     not working do to arthritis; used to be Patent attorney for a [employer] History Main Topics  . Smoking status: Former Smoker -- 1.00 packs/day for 30 years    Types: Cigarettes    Quit date: 03/21/2003  . Smokeless tobacco: Never Used  . Alcohol Use: No     Comment: H/O case of beer weekly x 20 years, quiting in 2000-ish.  . Drug Use: No     Comment: H/O marijuana use many years ago.  [name] Sexual Activity: Yes    Birth Control/ Protection: None   Other Topics Concern  . Not on file   Social History Narrative    Family History  Problem Relation Age of Onset  . Hypertension Mother     ROS- All systems are reviewed and negative except as per the HPI above  Physical Exam: Filed Vitals:   04/19/15 0934  BP: 124/60  Pulse: 65  Height: 5\' 7"  (1.702 m)  Weight: 170 lb 12.8 oz (77.474 kg)    GEN- The patient is well appearing, alert and oriented x 3 today.   Head- normocephalic, atraumatic Eyes-  Sclera clear, conjunctiva pink Ears- hearing intact Oropharynx- clear Neck- supple, no JVP Lymph- no cervical lymphadenopathy Lungs- Clear to ausculation bilaterally, normal work of breathing Heart- Regular rate and rhythm, no murmurs, rubs or gallops, PMI not laterally displaced GI- soft, NT, ND, + BS Extremities- no clubbing, cyanosis, or edema MS- no significant deformity or atrophy Skin- no rash or lesion Psych- euthymic mood, full affect Neuro- strength and sensation are intact  EKG-NSR, LAD, LBBB, pr ints 174 ms, qrs int 150 ms, qtc 468 ms Epic records reviewed  Assessment and Plan: 1. Aflutter In SR with amiodarone 200 mg qd F/u with pulmonologist for pulmonary fibrosis/amiodarone Hopefully amiodarone will be short term with ablation being planned in near future Continue with eliquis 5 mg bid  2. CAD/LV dysfunction Increase carvedilol  to 12. 5 mg qd instead of going to 18.75 mg on d/c papers (currently taking 6.25 mg bid) due to heart rate in the 60's Continue lasix, weight stable Avoiding salt Weighting daily Continue plavix, precautions for bleeding discussed Continue atorvastatin  3. URI Resolved   4. HTN Stable  Appointment will be made with Dr. Rayann Heman in one month for aflutter ablation  Butch Penny C. Asad Keeven, [facility] [address] [place], [place] [address] [phone]

## 2015-04-19 NOTE — Assessment & Plan Note (Signed)
Stop taking lisinopril Take losartan 50 mg instead #15

## 2015-04-19 NOTE — Telephone Encounter (Signed)
Last ov on 04/19/15 with RA.   Patient Instructions     Stop taking lisinopril Take losartan 50 mg instead #15  Take Delsym cough syrup 5 ML oral twice daily  High-resolution CT scan and PFTs will be scheduled  If cough persists, may need low-dose prednisone-check with Dr. Estanislado Pandy of this would be okay  Amiodarone is good medicine for the heart-but can cause damage to the lung   Called and spoke with the patient. I explained to him that RA had no availability. I scheduled pt with TP on 05/22/15 at 2:30. Pt voiced understanding and had no further questions. Nothing further needed.

## 2015-04-19 NOTE — Patient Instructions (Signed)
Your physician has recommended you make the following change in your medication:  1)Coreg 12.5mg  (1 tablet) take by mouth twice a day  Scheduler will be in touch with you regarding follow up appointment with Dr. Rayann Heman.

## 2015-04-19 NOTE — Patient Instructions (Signed)
Stop taking lisinopril Take losartan 50 mg instead #15  Take Delsym cough syrup 5 ML oral twice daily  High-resolution CT scan and PFTs will be scheduled  If cough persists, may need low-dose prednisone-check with Dr. Estanislado Pandy of this would be okay  Amiodarone is good medicine for the heart-but can cause damage to the lung

## 2015-04-19 NOTE — Assessment & Plan Note (Signed)
Take Delsym cough syrup 5 ML oral twice daily  High-resolution CT scan and PFTs will be scheduled  If cough persists, may need low-dose prednisone-check with Dr. Estanislado Pandy of this would be okay  Amiodarone is good medicine for the heart-but can cause damage to the lung

## 2015-04-19 NOTE — Telephone Encounter (Signed)
Per OV today: Patient Instructions       Stop taking lisinopril Take losartan 50 mg instead #15  Take Delsym cough syrup 5 ML oral twice daily  High-resolution CT scan and PFTs will be scheduled  If cough persists, may need low-dose prednisone-check with Dr. Estanislado Pandy of this would be okay  Amiodarone is good medicine for the heart-but can cause damage to the lung  --  RX has been sent in for this. Nothing further needed

## 2015-04-19 NOTE — Progress Notes (Signed)
   Subjective:    Patient ID: Curtis Flores, male    DOB: 07/29/1955, 60 y.o.   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  Seen by Dr Ha for persistent leucocytosis, bone marrow biopsy neg    04/19/2015  Chief Complaint  Patient presents with  . pulmonary consult    Referred by Dr. Devashwar for Fibrosis.  Patient complains of cough.     Asked to follow for 2 years  He c/ of a cough for the last 3 months, dry and deep cough no diurnal or seasonal variation Interim history- 02/2015 he had a myocardial infarction requiring stents, EF was 20%. He developed atrial fibrillation and was placed on amiodarone ([place]) He has been on lisinopril at least going back to 06/2014 or prior by his PCP, presumably for hypertension  He had inadequate response to Biologics for rheumatoid arthritis and is now on arava   Significant tests/ events 2012 -  presented with a symmetric polyarthritis of elbows, wrists, hands, neck, shoulders & feet  CXR at [facility] showed bibasal interstitial fibrosis with hilar lymphadenopathy  With POS  RA 89 &  CCP 122, ANA neg, HLA B 27 neg, CK 87  PPD neg, hep panel neg  HRCT - extensive regions of ground-glass attenuation and most pronounced in the lower lobes of the lungs bilaterally. These areas demonstrate internal interstitial prominence with a large amount of microcystic honeycombingwith some cylindrical bronchiectasis and bronchiolectasis. moderate hiatal hernia was also noted.   PFTs 2013 showed intraprenchymal restricton with FVC around 71% & DLCO 56% c/w ILD, no airway obstruction.  Rpt PFTs 10/2012 show mild drop in FVC to 66%, DLCO preserved   Review of Systems  Constitutional: Negative for fever, chills, activity change, appetite change  and unexpected weight change.  HENT: Negative for congestion, dental problem, postnasal drip, rhinorrhea, sneezing, sore throat, trouble swallowing and voice change.   Eyes: Negative for visual disturbance.  Respiratory: Positive for cough and wheezing. Negative for choking and shortness of breath.   Cardiovascular: Negative for chest pain and leg swelling.  Gastrointestinal: Negative for nausea, vomiting and abdominal pain.  Genitourinary: Negative for difficulty urinating.  Musculoskeletal: Negative for arthralgias.  Skin: Negative for rash.  Psychiatric/Behavioral: Negative for behavioral problems and confusion.       Objective:   Physical Exam   Gen. Pleasant, well-nourished, in no distress, normal affect ENT - no lesions, no post nasal drip Neck: No JVD, no thyromegaly, no carotid bruits Lungs: no use of accessory muscles, no dullness to percussion, bibasal fine crackles Cardiovascular: Rhythm regular, heart sounds  normal, no murmurs or gallops, no peripheral edema Abdomen: soft and non-tender, no hepatosplenomegaly, BS normal. Musculoskeletal: No deformities, no cyanosis or clubbing Neuro:  alert, non focal        Assessment & Plan:   

## 2015-04-19 NOTE — Addendum Note (Signed)
Encounter addended by: Sherran Needs, NP on: 04/19/2015  1:44 PM<BR>     Documentation filed: Notes Section

## 2015-04-20 ENCOUNTER — Ambulatory Visit: Payer: Medicare Other | Admitting: Physician Assistant

## 2015-04-26 DIAGNOSIS — J40 Bronchitis, not specified as acute or chronic: Secondary | ICD-10-CM | POA: Diagnosis not present

## 2015-04-26 DIAGNOSIS — Z09 Encounter for follow-up examination after completed treatment for conditions other than malignant neoplasm: Secondary | ICD-10-CM | POA: Diagnosis not present

## 2015-04-26 DIAGNOSIS — J84115 Respiratory bronchiolitis interstitial lung disease: Secondary | ICD-10-CM | POA: Diagnosis not present

## 2015-04-26 DIAGNOSIS — L408 Other psoriasis: Secondary | ICD-10-CM | POA: Diagnosis not present

## 2015-04-28 ENCOUNTER — Inpatient Hospital Stay: Admission: RE | Admit: 2015-04-28 | Payer: Medicare Other | Source: Ambulatory Visit

## 2015-05-03 ENCOUNTER — Other Ambulatory Visit: Payer: Self-pay | Admitting: Pulmonary Disease

## 2015-05-03 DIAGNOSIS — I2510 Atherosclerotic heart disease of native coronary artery without angina pectoris: Secondary | ICD-10-CM | POA: Diagnosis not present

## 2015-05-03 DIAGNOSIS — I4891 Unspecified atrial fibrillation: Secondary | ICD-10-CM | POA: Diagnosis not present

## 2015-05-03 DIAGNOSIS — K219 Gastro-esophageal reflux disease without esophagitis: Secondary | ICD-10-CM | POA: Diagnosis not present

## 2015-05-03 DIAGNOSIS — Z139 Encounter for screening, unspecified: Secondary | ICD-10-CM | POA: Diagnosis not present

## 2015-05-03 DIAGNOSIS — E784 Other hyperlipidemia: Secondary | ICD-10-CM | POA: Diagnosis not present

## 2015-05-09 ENCOUNTER — Encounter: Payer: Self-pay | Admitting: Cardiovascular Disease

## 2015-05-09 ENCOUNTER — Ambulatory Visit (HOSPITAL_COMMUNITY): Payer: BLUE CROSS/BLUE SHIELD | Admitting: Hematology & Oncology

## 2015-05-09 ENCOUNTER — Ambulatory Visit (INDEPENDENT_AMBULATORY_CARE_PROVIDER_SITE_OTHER): Payer: Medicare Other | Admitting: Cardiovascular Disease

## 2015-05-09 ENCOUNTER — Encounter (HOSPITAL_COMMUNITY): Payer: Medicare Other | Attending: Hematology & Oncology

## 2015-05-09 VITALS — BP 104/52 | HR 64 | Ht 67.0 in | Wt 164.0 lb

## 2015-05-09 DIAGNOSIS — D72829 Elevated white blood cell count, unspecified: Secondary | ICD-10-CM

## 2015-05-09 DIAGNOSIS — I2121 ST elevation (STEMI) myocardial infarction involving left circumflex coronary artery: Secondary | ICD-10-CM | POA: Diagnosis not present

## 2015-05-09 DIAGNOSIS — Z955 Presence of coronary angioplasty implant and graft: Secondary | ICD-10-CM | POA: Diagnosis not present

## 2015-05-09 DIAGNOSIS — E785 Hyperlipidemia, unspecified: Secondary | ICD-10-CM

## 2015-05-09 DIAGNOSIS — R591 Generalized enlarged lymph nodes: Secondary | ICD-10-CM | POA: Diagnosis not present

## 2015-05-09 DIAGNOSIS — I4892 Unspecified atrial flutter: Secondary | ICD-10-CM | POA: Diagnosis not present

## 2015-05-09 DIAGNOSIS — I519 Heart disease, unspecified: Secondary | ICD-10-CM | POA: Diagnosis not present

## 2015-05-09 DIAGNOSIS — I2510 Atherosclerotic heart disease of native coronary artery without angina pectoris: Secondary | ICD-10-CM

## 2015-05-09 LAB — CBC WITH DIFFERENTIAL/PLATELET
BASOS ABS: 0.1 10*3/uL (ref 0.0–0.1)
Basophils Relative: 1 %
Eosinophils Absolute: 0.3 10*3/uL (ref 0.0–0.7)
Eosinophils Relative: 3 %
HEMATOCRIT: 37 % — AB (ref 39.0–52.0)
Hemoglobin: 11.7 g/dL — ABNORMAL LOW (ref 13.0–17.0)
LYMPHS PCT: 33 %
Lymphs Abs: 3.6 10*3/uL (ref 0.7–4.0)
MCH: 24.6 pg — ABNORMAL LOW (ref 26.0–34.0)
MCHC: 31.6 g/dL (ref 30.0–36.0)
MCV: 77.9 fL — AB (ref 78.0–100.0)
MONO ABS: 1.2 10*3/uL — AB (ref 0.1–1.0)
Monocytes Relative: 11 %
NEUTROS ABS: 5.7 10*3/uL (ref 1.7–7.7)
Neutrophils Relative %: 52 %
Platelets: 287 10*3/uL (ref 150–400)
RBC: 4.75 MIL/uL (ref 4.22–5.81)
RDW: 17.8 % — AB (ref 11.5–15.5)
WBC: 10.9 10*3/uL — ABNORMAL HIGH (ref 4.0–10.5)

## 2015-05-09 NOTE — Assessment & Plan Note (Signed)
History of hypertension blood pressure measurements at 104/52. He is on Coreg and losartan. Continue current meds at current dosing

## 2015-05-09 NOTE — Assessment & Plan Note (Signed)
History of hypokalemia on statin therapy with recent lipid profile performed 02/22/15 revealing total cholesterol 80, LDL of 40 HDL of 20

## 2015-05-09 NOTE — Progress Notes (Signed)
05/09/2015 Curtis Flores   08-Mar-1955  [id number]  Primary Physician [name], MD Primary Cardiologist: Lorretta Harp MD Renae Gloss   HPI:  [name] is a [age]-year-old thin-appearing married African-American male father of 2, grandfather to 3 grandchildren accompanied by his wife today. I last saw him in the office 04/05/15.His primary care physician is Dr. Edrick Oh in medicine. I recently saw him in the office 03/07/15. He has a remote history of tobacco abuse as well as a history of hypertension and hyperlipidemia rheumatoid arthritis. He had an inferolateral STEMI on 02/22/15. I took him to the Cath Lab and stented his proximal dominant circumflex and first obtuse marginal branch with drug-eluting stents (T stenting). He had residual 90% stenosis and a mid nondominant RCA 80% proximal LAD stenosis. His EF was 35-40% by 2-D echo. . There is a history of pulmonary fibrosis however as well. When I saw him in the office on 04/05/15 he was significantly dyspneic and tachycardic. I initially thought that this was sinus tachycardia however he was admitted to the hospital 2 days later with progressive dyspnea and he was diagnosed with atrial flutter. He did have a wide complex tachycardia prior to that which converted with venous adenosine. He UNDERWENT transesophageal echo/DC cardioversion successfully was has resulted in marked improvement in his symptoms. His EF at that time was 20%. He was put on liquids oral anticoagulations and splinting was changed to Plavix. He was loaded with amiodarone and currently remains on 20 mg a day. He scheduled to see Dr. Rayann Heman back in several weeks for consideration of a flutter ablation.  Current Outpatient Prescriptions  Medication Sig Dispense Refill  . amiodarone (PACERONE) 200 MG tablet Take 1 tablet (200 mg total) by mouth daily. 30 tablet 3  . apixaban (ELIQUIS) 5 MG TABS tablet Take 1 tablet (5 mg total) by mouth 2 (two) times daily. 60  tablet 6  . Apremilast (OTEZLA) 30 MG TABS Take 1 tablet by mouth 2 (two) times daily.    [name] atorvastatin (LIPITOR) 80 MG tablet Take 1 tablet (80 mg total) by mouth daily at 6 PM. 30 tablet 6  . carvedilol (COREG) 12.5 MG tablet Take 1 tablet (12.5 mg total) by mouth 2 (two) times daily. 60 tablet 3  . clopidogrel (PLAVIX) 75 MG tablet Take 1 tablet (75 mg total) by mouth daily. 30 tablet 6  . furosemide (LASIX) 40 MG tablet Take 1 tablet (40 mg total) by mouth 2 (two) times daily. 60 tablet 3  . guaiFENesin (MUCINEX) 600 MG 12 hr tablet Take 600 mg by mouth 2 (two) times daily.    [name] leflunomide (ARAVA) 20 MG tablet Take 20 mg by mouth daily.     [name] losartan (COZAAR) 50 MG tablet TAKE 1 TABLET (50 MG TOTAL) BY MOUTH DAILY. 30 tablet 5  . pantoprazole (PROTONIX) 40 MG tablet Take 1 tablet (40 mg total) by mouth daily. 30 tablet 11   No current facility-administered medications for this visit.    No Known Allergies  Social History   Social History  . Marital Status: Married    Spouse Name: N/A  . Number of Children: 2  . Years of Education: N/A   Occupational History  . 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 Smoking status: Former Smoker -- 1.00 packs/day for 30 years    Types:  Cigarettes    Quit date: 03/21/2003  . Smokeless tobacco: Never Used  . Alcohol Use: No     Comment: H/O case of beer weekly x 20 years, quiting in 2000-ish.  . Drug Use: No     Comment: H/O marijuana use many years ago.  [name] Sexual Activity: Yes    Birth Control/ Protection: None   Other Topics Concern  . Not on file   Social History Narrative     Review of Systems: General: negative for chills, fever, night sweats or weight changes.  Cardiovascular: negative for chest pain, dyspnea on exertion, edema, orthopnea, palpitations, paroxysmal nocturnal dyspnea or shortness of breath Dermatological: negative for  rash Respiratory: negative for cough or wheezing Urologic: negative for hematuria Abdominal: negative for nausea, vomiting, diarrhea, bright red blood per rectum, melena, or hematemesis Neurologic: negative for visual changes, syncope, or dizziness All other systems reviewed and are otherwise negative except as noted above.    Blood pressure 104/52, pulse 64, height 5\' 7"  (1.702 m), weight 164 lb (74.39 kg).  General appearance: alert and no distress Neck: no adenopathy, no JVD, supple, symmetrical, trachea midline, thyroid not enlarged, symmetric, no tenderness/mass/nodules and right carotid bruit Lungs: clear to auscultation bilaterally Heart: regular rate and rhythm, S1, S2 normal, no murmur, click, rub or gallop Extremities: extremities normal, atraumatic, no cyanosis or edema  EKG sinus bradycardia 53 with left bundle branch block and residual T-wave inversion across his precordium as well as in the inferior leads. I personally reviewed this EKG  ASSESSMENT AND PLAN:   Hypertension History of hypertension blood pressure measurements at 104/52. He is on Coreg and losartan. Continue current meds at current dosing  Hyperlipidemia History of hypokalemia on statin therapy with recent lipid profile performed 02/22/15 revealing total cholesterol 80, LDL of 40 HDL of 20  CAD (coronary artery disease), native coronary artery History of coronary artery disease status post lateral wall STEMI 02/22/15 treated with "T stenting using drug-eluting stents in its proximal dominant circumflex and obtuse marginal branch bifurcation. He did have moderate disease in his LAD and RCA with severe LV dysfunction.  He currently is on aspirin and Plavix. His most recent ejection fraction by TEE was 20% however this was in the setting of atrial flutter which was since cardioverted. His breathing has improved. He denies chest pain. Did repeat a 2-D echo and a formal neurologic Myoview stress test to assess the  physiologic significance of his residual CAD.  Atrial flutter [facility]) [name] was seen by me on 04/05/15 with what I thought was sinus tachycardia but ultimately was shown to be a flutter with a rapid ventricular response. He was admitted to the hospital 2 days later ultimately underwent transesophageal DC cardioversion to sinus rhythm. He was placed on oral anticoagulation with Eliquis  and his Luna Kitchens was switched to Plavix. He was also placed on amiodarone which was loaded and currently he is on 200 mg a day. He is in sinus rhythm on exam today. He is scheduled to see Dr. Rayann Heman back for consideration of a flutter ablation.      Lorretta Harp MD FACP,FACC,FAHA, [facility] 05/09/2015 10:10 AM

## 2015-05-09 NOTE — Patient Instructions (Signed)
Medication Instructions:  Your physician recommends that you continue on your current medications as directed. Please refer to the Current Medication list given to you today.   Labwork: none  Testing/Procedures: Your physician has requested that you have a lexiscan myoview. For further information please visit [email or web address]. Please follow instruction sheet, as given.  Your physician has requested that you have an echocardiogram. Echocardiography is a painless test that uses sound waves to create images of your heart. It provides your doctor with information about the size and shape of your heart and how well your heart's chambers and valves are working. This procedure takes approximately one hour. There are no restrictions for this procedure.    Follow-Up: We request that you follow-up in: 3 months with an extender and in 6 months with Dr Andria Rhein will receive a reminder letter in the mail two months in advance. If you don't receive a letter, please call our office to schedule the follow-up appointment.    Any Other Special Instructions Will Be Listed Below (If Applicable).     If you need a refill on your cardiac medications before your next appointment, please call your pharmacy.

## 2015-05-09 NOTE — Assessment & Plan Note (Signed)
[name] was seen by me on 04/05/15 with what I thought was sinus tachycardia but ultimately was shown to be a flutter with a rapid ventricular response. He was admitted to the hospital 2 days later ultimately underwent transesophageal DC cardioversion to sinus rhythm. He was placed on oral anticoagulation with Eliquis  and his Luna Kitchens was switched to Plavix. He was also placed on amiodarone which was loaded and currently he is on 200 mg a day. He is in sinus rhythm on exam today. He is scheduled to see Dr. Rayann Heman back for consideration of a flutter ablation.

## 2015-05-09 NOTE — Assessment & Plan Note (Signed)
History of coronary artery disease status post lateral wall STEMI 02/22/15 treated with "T stenting using drug-eluting stents in its proximal dominant circumflex and obtuse marginal branch bifurcation. He did have moderate disease in his LAD and RCA with severe LV dysfunction.  He currently is on aspirin and Plavix. His most recent ejection fraction by TEE was 20% however this was in the setting of atrial flutter which was since cardioverted. His breathing has improved. He denies chest pain. Did repeat a 2-D echo and a formal neurologic Myoview stress test to assess the physiologic significance of his residual CAD.

## 2015-05-10 DIAGNOSIS — K219 Gastro-esophageal reflux disease without esophagitis: Secondary | ICD-10-CM | POA: Diagnosis not present

## 2015-05-10 DIAGNOSIS — I2510 Atherosclerotic heart disease of native coronary artery without angina pectoris: Secondary | ICD-10-CM | POA: Diagnosis not present

## 2015-05-10 DIAGNOSIS — R799 Abnormal finding of blood chemistry, unspecified: Secondary | ICD-10-CM | POA: Diagnosis not present

## 2015-05-10 DIAGNOSIS — I4891 Unspecified atrial fibrillation: Secondary | ICD-10-CM | POA: Diagnosis not present

## 2015-05-10 LAB — PATHOLOGIST SMEAR REVIEW

## 2015-05-12 ENCOUNTER — Ambulatory Visit (INDEPENDENT_AMBULATORY_CARE_PROVIDER_SITE_OTHER)
Admission: RE | Admit: 2015-05-12 | Discharge: 2015-05-12 | Disposition: A | Discharge status: Home / Self Care | Payer: Medicare Other | Source: Ambulatory Visit | Attending: Pulmonary Disease | Admitting: Pulmonary Disease

## 2015-05-12 ENCOUNTER — Telehealth: Payer: Self-pay | Admitting: Cardiovascular Disease

## 2015-05-12 DIAGNOSIS — J8410 Pulmonary fibrosis, unspecified: Secondary | ICD-10-CM | POA: Diagnosis not present

## 2015-05-12 DIAGNOSIS — J984 Other disorders of lung: Secondary | ICD-10-CM | POA: Diagnosis not present

## 2015-05-12 NOTE — Telephone Encounter (Signed)
Are there any assistance programs that can help this pt afford the mediation?  Cost when went to pick it up was over $400 for one month supply. Pt has Medicare.  Pt has enough pills to get through Tuesday morning right, now.  Wife really does not want to chang to Coumadin as it will require more monitoring of food and blood levels. Told her would forward to PharmD to review and someone will get back to her next week.  Pt verbalized understanding, no additional questions at this times.

## 2015-05-12 NOTE — Telephone Encounter (Signed)
She just went to pharmacy to pick up his Eliquis. Pt can not afford it,is there something else he can take? Please call today and let her know something.

## 2015-05-15 DIAGNOSIS — Z1211 Encounter for screening for malignant neoplasm of colon: Secondary | ICD-10-CM | POA: Diagnosis not present

## 2015-05-15 NOTE — Telephone Encounter (Signed)
(  con't) of 110 and therefore cannot take per medication guidelines.  LMOM for wife.  May be best off if they can get the Eliquis now, as that would probably put them about at the 3% requirement by [employer] Patient assistance.

## 2015-05-15 NOTE — Telephone Encounter (Signed)
Spent lengthy conversation with wife.  Patient has no Medicare D plan.  Eliquis and Xarelto have no discount programs for those without prescription insurance.  Eliquis offers a free program for those who have spent 3% of annual income on prescription drugs.  Wife gave me income information, figured they would have to spend $623.50 to qualify.  She assures me they have not spent that.  [facility] offers a discount off cash price of up to $350/month, however patient has CrCl

## 2015-05-16 NOTE — Telephone Encounter (Signed)
Spoke with wife, explained that cannot take Savaysa because of CrCl > 95.  Encouraged them to get printout from pharmacy on costs of meds for 2017.  If > $623.50 can apply for free Eliquis for 2017.  Wife also called SS Admin about [place], they are sending her information now.    Patient has appointment with Dr. Rayann Heman on Thursday.  Advised that they can discuss with him about any other suggestions/options at that time.

## 2015-05-18 ENCOUNTER — Encounter: Payer: Self-pay | Admitting: Internal Medicine

## 2015-05-18 ENCOUNTER — Ambulatory Visit (INDEPENDENT_AMBULATORY_CARE_PROVIDER_SITE_OTHER): Payer: Medicare Other | Admitting: Internal Medicine

## 2015-05-18 VITALS — BP 114/50 | HR 62 | Ht 67.0 in | Wt 164.0 lb

## 2015-05-18 DIAGNOSIS — I4892 Unspecified atrial flutter: Secondary | ICD-10-CM | POA: Diagnosis not present

## 2015-05-18 DIAGNOSIS — I483 Typical atrial flutter: Secondary | ICD-10-CM

## 2015-05-18 DIAGNOSIS — I447 Left bundle-branch block, unspecified: Secondary | ICD-10-CM | POA: Diagnosis not present

## 2015-05-18 DIAGNOSIS — I10 Essential (primary) hypertension: Secondary | ICD-10-CM

## 2015-05-18 DIAGNOSIS — J8410 Pulmonary fibrosis, unspecified: Secondary | ICD-10-CM

## 2015-05-18 DIAGNOSIS — I2121 ST elevation (STEMI) myocardial infarction involving left circumflex coronary artery: Secondary | ICD-10-CM

## 2015-05-18 LAB — BASIC METABOLIC PANEL
BUN: 10 mg/dL (ref 7–25)
CO2: 29 mmol/L (ref 20–31)
Calcium: 9.3 mg/dL (ref 8.6–10.3)
Chloride: 102 mmol/L (ref 98–110)
Creat: 0.61 mg/dL — ABNORMAL LOW (ref 0.70–1.25)
Glucose, Bld: 87 mg/dL (ref 65–99)
POTASSIUM: 4 mmol/L (ref 3.5–5.3)
SODIUM: 138 mmol/L (ref 135–146)

## 2015-05-18 LAB — CBC WITH DIFFERENTIAL/PLATELET
Basophils Absolute: 0.1 10*3/uL (ref 0.0–0.1)
Basophils Relative: 1 % (ref 0–1)
Eosinophils Absolute: 0.2 10*3/uL (ref 0.0–0.7)
Eosinophils Relative: 2 % (ref 0–5)
HCT: 36.7 % — ABNORMAL LOW (ref 39.0–52.0)
Hemoglobin: 11.6 g/dL — ABNORMAL LOW (ref 13.0–17.0)
Lymphocytes Relative: 29 % (ref 12–46)
Lymphs Abs: 3.5 10*3/uL (ref 0.7–4.0)
MCH: 24.2 pg — ABNORMAL LOW (ref 26.0–34.0)
MCHC: 31.6 g/dL (ref 30.0–36.0)
MCV: 76.5 fL — ABNORMAL LOW (ref 78.0–100.0)
MPV: 10.9 fL (ref 8.6–12.4)
Monocytes Absolute: 1.3 10*3/uL — ABNORMAL HIGH (ref 0.1–1.0)
Monocytes Relative: 11 % (ref 3–12)
Neutro Abs: 7 10*3/uL (ref 1.7–7.7)
Neutrophils Relative %: 57 % (ref 43–77)
Platelets: 305 10*3/uL (ref 150–400)
RBC: 4.8 MIL/uL (ref 4.22–5.81)
RDW: 17.7 % — ABNORMAL HIGH (ref 11.5–15.5)
WBC: 12.2 10*3/uL — ABNORMAL HIGH (ref 4.0–10.5)

## 2015-05-18 NOTE — Progress Notes (Signed)
Electrophysiology Office Note   Date:  05/18/2015   ID:  Curtis Flores, DOB Dec 24, 1955, MRN [id number]  PCP:  [name], MD  Cardiologist:  Dr [name] Primary Electrophysiologist: [name], MD    Chief Complaint  Patient presents with  . Atrial Flutter     History of Present Illness: Curtis Flores is a 60 y.o. male who presents today for electrophysiology evaluation.   See my consult note from 2/17 for full details.  He reports that he is maintaining sinus rhythm with amidoarone but has fatigue with this medicine.  Also reports some BRBPR with eliquis.  Also requires antiplatelet therapy.  Today, he denies symptoms of palpitations, chest pain, shortness of breath, orthopnea, PND, lower extremity edema, claudication, dizziness, presyncope, syncope, bleeding, or neurologic sequela. The patient is tolerating medications without difficulties and is otherwise without complaint today.    Past Medical History  Diagnosis Date  . Psoriasis 2003  . Rheumatoid arthritis(714.0) 2012  . Psoriatic arthritis ([place])   . Leukocytosis   . Lymphadenopathy   . Pulmonary fibrosis ([place])   . Hypertension   . GERD (gastroesophageal reflux disease)   . Hyperlipidemia   . Coronary artery disease     lateral STEMI 02/22/15  . Typical atrial flutter ([place])   . LBBB (left bundle branch block)   . Ischemic cardiomyopathy   . Chronic systolic dysfunction of left ventricle    Past Surgical History  Procedure Laterality Date  . Cardiac catheterization N/A 02/22/2015    Procedure: Left Heart Cath and Coronary Angiography;  Surgeon: Lorretta Harp, MD;  Location: [facility];  Service: Cardiovascular;  Laterality: N/A;  . Cardiac catheterization N/A 02/22/2015    Procedure: Coronary Stent Intervention;  Surgeon: Lorretta Harp, MD;  Location: [facility];  Service: Cardiovascular;  Laterality: N/A;  . Tee without cardioversion N/A 04/12/2015    Procedure: TRANSESOPHAGEAL  ECHOCARDIOGRAM (TEE);  Surgeon: Larey Dresser, MD;  Location: [facility];  Service: Cardiovascular;  Laterality: N/A;  . Cardioversion N/A 04/12/2015    Procedure: CARDIOVERSION;  Surgeon: Larey Dresser, MD;  Location: [facility];  Service: Cardiovascular;  Laterality: N/A;     Current Outpatient Prescriptions  Medication Sig Dispense Refill  . amiodarone (PACERONE) 200 MG tablet Take 1 tablet (200 mg total) by mouth daily. 30 tablet 3  . apixaban (ELIQUIS) 5 MG TABS tablet Take 1 tablet (5 mg total) by mouth 2 (two) times daily. 60 tablet 6  . Apremilast (OTEZLA) 30 MG TABS Take 1 tablet by mouth 2 (two) times daily.    [name] atorvastatin (LIPITOR) 80 MG tablet Take 1 tablet (80 mg total) by mouth daily at 6 PM. 30 tablet 6  . carvedilol (COREG) 12.5 MG tablet Take 1 tablet (12.5 mg total) by mouth 2 (two) times daily. 60 tablet 3  . clopidogrel (PLAVIX) 75 MG tablet Take 1 tablet (75 mg total) by mouth daily. 30 tablet 6  . furosemide (LASIX) 40 MG tablet Take 1 tablet (40 mg total) by mouth 2 (two) times daily. 60 tablet 3  . leflunomide (ARAVA) 20 MG tablet Take 20 mg by mouth daily.     [name] losartan (COZAAR) 50 MG tablet TAKE 1 TABLET (50 MG TOTAL) BY MOUTH DAILY. 30 tablet 5  . pantoprazole (PROTONIX) 40 MG tablet Take 1 tablet (40 mg total) by mouth daily. 30 tablet 11   No current facility-administered medications for this visit.    Allergies:  Review of patient's allergies indicates no known allergies.   Social History:  The patient  reports that he quit smoking about 12 years ago. His smoking use included Cigarettes. He has a 30 pack-year smoking history. He has never used smokeless tobacco. He reports that he does not drink alcohol or use illicit drugs.   Family History:  The patient's  family history includes Hypertension in his mother.    ROS:  Please see the history of present illness.   All other systems are reviewed and negative.    PHYSICAL EXAM: VS:  BP 114/50 mmHg   Pulse 62  Ht 5\' 7"  (1.702 m)  Wt 164 lb (74.39 kg)  BMI 25.68 kg/m2 , BMI Body mass index is 25.68 kg/(m^2). GEN: Well nourished, well developed, in no acute distress HEENT: normal Neck: no JVD, carotid bruits, or masses Cardiac: RRR; no murmurs, rubs, or gallops,no edema  Respiratory:  clear to auscultation bilaterally, normal work of breathing GI: soft, nontender, nondistended, + BS MS: no deformity or atrophy Skin: warm and dry  Neuro:  Strength and sensation are intact Psych: euthymic mood, full affect  EKG:  EKG is ordered today. The ekg ordered today shows sinus rhythm   Recent Labs: 02/22/2015: ALT 18 04/07/2015: B Natriuretic Peptide 339.0*; Magnesium 1.7 04/11/2015: TSH 1.188 04/13/2015: BUN 10; Creatinine, Ser 0.73; Potassium 3.9; Sodium 133* 05/09/2015: Hemoglobin 11.7*; Platelets 287    Lipid Panel     Component Value Date/Time   CHOL 80 02/22/2015 2135   TRIG 61 02/22/2015 2135   HDL 20* 02/22/2015 2135   CHOLHDL 4.0 02/22/2015 2135   VLDL 12 02/22/2015 2135   LDLCALC 48 02/22/2015 2135     Wt Readings from Last 3 Encounters:  05/18/15 164 lb (74.39 kg)  05/09/15 164 lb (74.39 kg)  04/19/15 169 lb 6.4 oz (76.839 kg)      Other studies Reviewed: Additional studies/ records that were reviewed today include: my consult note, Dr Kennon Holter recent note    ASSESSMENT AND PLAN:  1. Typical atrial Flutter The patient was recently hospitalized with rapidly conducting atrial flutter. Despite pulmonary fibrosis and prior lung issues, he was placed on amiodarone. He was cardioverted and is now maintaining sinus with eliquis.  He is not tolerating eliquis or amiodarone well. Therapeutic strategies for atrial flutter including medicine and ablation were discussed in detail with the patient today. Risk, benefits, and alternatives to EP study and radiofrequency ablation were also discussed in detail today. These risks include but are not limited to stroke, bleeding,  vascular damage, tamponade, perforation, damage to the heart and other structures, AV block requiring pacemaker, worsening renal function, and death. The patient understands these risk and wishes to proceed.  We will therefore proceed with catheter ablation at the next available time.  Anesthesia will be required for the procedure.   2. CAD Recent PCI No ischemic symptoms Continue eliquis and plavix Hopefully post ablation we can stop eliquis and return to ASA and plavix long term  3. Ischemic CM/ chronic systolic dysfunction/ LBBB Continue to optimize medicines as an outpatient Hopefully EF will improve with sinus rhythm (though I doubt that it will normalize--> prior EF 40%)    Current medicines are reviewed at length with the patient today.   The patient does not have concerns regarding his medicines.  The following changes were made today:  none   Signed, [name], MD  05/18/2015 9:25 AM     [facility] [address]  [address] [place] [address] [phone] (office) [phone] (fax)

## 2015-05-18 NOTE — Patient Instructions (Addendum)
Medication Instructions:  Your physician recommends that you continue on your current medications as directed. Please refer to the Current Medication list given to you today.   Labwork: Your physician recommends that you return for lab work today: BMP/CBC   Testing/Procedures: Your physician has recommended that you have an ablation. Catheter ablation is a medical procedure used to treat some cardiac arrhythmias (irregular heartbeats). During catheter ablation, a long, thin, flexible tube is put into a blood vessel in your groin (upper thigh), or neck. This tube is called an ablation catheter. It is then guided to your heart through the blood vessel. Radio frequency waves destroy small areas of heart tissue where abnormal heartbeats may cause an arrhythmia to start. Please see the instruction sheet given to you today.  Please arrive at the [facility] at 7:30am on 05/30/15 for your procedure.  Do not eat or drink after midnight the night before your procedure and do not take any medications the morning of the procedure  Plan for one night stay on the hospital  Follow-Up:  Your physician recommends that you schedule a follow-up appointment in: 4 weeks from 3/28 with Dr Rayann Heman   Any Other Special Instructions Will Be Listed Below (If Applicable).  Cardiac Ablation Cardiac ablation is a procedure to disable a small amount of heart tissue in very specific places. The heart has many electrical connections. Sometimes these connections are abnormal and can cause the heart to beat very fast or irregularly. By disabling some of the problem areas, heart rhythm can be improved or made normal. Ablation is done for people who:   Have Wolff-Parkinson-White syndrome.   Have other fast heart rhythms (tachycardia).   Have taken medicines for an abnormal heart rhythm (arrhythmia) that resulted in:   No success.   Side effects.   May have a high-risk heartbeat  that could result in death.  LET [facility] CARE PROVIDER KNOW ABOUT:   Any allergies you have or any previous reactions you have had to X-ray dye, food (such as seafood), medicine, or tape.   All medicines you are taking, including vitamins, herbs, eye drops, creams, and over-the-counter medicines.   Previous problems you or members of your family have had with the use of anesthetics.   Any blood disorders you have.   Previous surgeries or procedures (such as a kidney transplant) you have had.   Medical conditions you have (such as kidney failure).  RISKS AND COMPLICATIONS Generally, cardiac ablation is a safe procedure. However, problems can occur and include:   Increased risk of cancer. Depending on how long it takes to do the ablation, the dose of radiation can be high.  Bruising and bleeding where a thin, flexible tube (catheter) was inserted during the procedure.   Bleeding into the chest, especially into the sac that surrounds the heart (serious).  Need for a permanent pacemaker if the normal electrical system is damaged.   The procedure may not be fully effective, and this may not be recognized for months. Repeat ablation procedures are sometimes required. BEFORE THE PROCEDURE   Follow any instructions from your health care provider regarding eating and drinking before the procedure.   Take your medicines as directed at regular times with water, unless instructed otherwise by your health care provider. If you are taking diabetes medicine, including insulin, ask how you are to take it and if there are any special instructions you should follow. It is common to adjust insulin  dosing the day of the ablation.  PROCEDURE  An ablation is usually performed in a catheterization laboratory with the guidance of fluoroscopy. Fluoroscopy is a type of X-ray that helps your health care provider see images of your heart during the procedure.   An ablation is a minimally  invasive procedure. This means a small cut (incision) is made in either your neck or groin. Your health care provider will decide where to make the incision based on your medical history and physical exam.  An IV tube will be started before the procedure begins. You will be given an anesthetic or medicine to help you relax (sedative).  The skin on your neck or groin will be numbed. A needle will be inserted into a large vein in your neck or groin and catheters will be threaded to your heart.  A special dye that shows up on fluoroscopy pictures may be injected through the catheter. The dye helps your health care provider see the area of the heart that needs treatment.  The catheter has electrodes on the tip. When the area of heart tissue that is causing the arrhythmia is found, the catheter tip will send an electrical current to the area and "scar" the tissue. Three types of energy can be used to ablate the heart tissue:   Heat (radiofrequency energy).   Laser energy.   Extreme cold (cryoablation).   When the area of the heart has been ablated, the catheter will be taken out. Pressure will be held on the insertion site. This will help the insertion site clot and keep it from bleeding. A bandage will be placed on the insertion site.  AFTER THE PROCEDURE   After the procedure, you will be taken to a recovery area where your vital signs (blood pressure, heart rate, and breathing) will be monitored. The insertion site will also be monitored for bleeding.   You will need to lie still for 4-6 hours. This is to ensure you do not bleed from the catheter insertion site.    This information is not intended to replace advice given to you by your health care provider. Make sure you discuss any questions you have with your health care provider.   Document Released: 07/07/2008 Document Revised: 03/11/2014 Document Reviewed: 07/13/2012 Elsevier Interactive Patient Education 2016 [name] sent    If you need a refill on your cardiac medications before your next appointment, please call your pharmacy.

## 2015-05-19 ENCOUNTER — Telehealth (HOSPITAL_COMMUNITY): Payer: Self-pay

## 2015-05-19 DIAGNOSIS — R799 Abnormal finding of blood chemistry, unspecified: Secondary | ICD-10-CM | POA: Diagnosis not present

## 2015-05-19 NOTE — Telephone Encounter (Signed)
Encounter complete. 

## 2015-05-22 ENCOUNTER — Encounter: Payer: Self-pay | Admitting: Adult Health

## 2015-05-22 ENCOUNTER — Ambulatory Visit (INDEPENDENT_AMBULATORY_CARE_PROVIDER_SITE_OTHER): Payer: Medicare Other | Admitting: Pulmonary Disease

## 2015-05-22 ENCOUNTER — Ambulatory Visit (INDEPENDENT_AMBULATORY_CARE_PROVIDER_SITE_OTHER): Payer: Medicare Other | Admitting: Adult Health

## 2015-05-22 VITALS — BP 120/60 | HR 60 | Ht 67.0 in | Wt 163.0 lb

## 2015-05-22 DIAGNOSIS — J8410 Pulmonary fibrosis, unspecified: Secondary | ICD-10-CM

## 2015-05-22 DIAGNOSIS — I2121 ST elevation (STEMI) myocardial infarction involving left circumflex coronary artery: Secondary | ICD-10-CM | POA: Diagnosis not present

## 2015-05-22 LAB — PULMONARY FUNCTION TEST
DL/VA % pred: 101 %
DL/VA: 4.5 ml/min/mmHg/L
DLCO UNC % PRED: 46 %
DLCO UNC: 13.03 ml/min/mmHg
DLCO cor % pred: 55 %
DLCO cor: 15.58 ml/min/mmHg
FEF 25-75 Post: 1.81 L/sec
FEF 25-75 Pre: 1.72 L/sec
FEF2575-%Change-Post: 5 %
FEF2575-%PRED-POST: 71 %
FEF2575-%Pred-Pre: 67 %
FEV1-%CHANGE-POST: 0 %
FEV1-%PRED-POST: 70 %
FEV1-%PRED-PRE: 70 %
FEV1-POST: 1.93 L
FEV1-PRE: 1.94 L
FEV1FVC-%Change-Post: 2 %
FEV1FVC-%PRED-PRE: 102 %
FEV6-%Change-Post: -3 %
FEV6-%PRED-PRE: 71 %
FEV6-%Pred-Post: 69 %
FEV6-POST: 2.34 L
FEV6-PRE: 2.42 L
FEV6FVC-%PRED-POST: 104 %
FEV6FVC-%PRED-PRE: 104 %
FVC-%CHANGE-POST: -3 %
FVC-%PRED-PRE: 68 %
FVC-%Pred-Post: 66 %
FVC-PRE: 2.42 L
FVC-Post: 2.34 L
POST FEV6/FVC RATIO: 100 %
PRE FEV6/FVC RATIO: 100 %
Post FEV1/FVC ratio: 82 %
Pre FEV1/FVC ratio: 80 %
RV % PRED: 60 %
RV: 1.27 L
TLC % PRED: 55 %
TLC: 3.55 L

## 2015-05-22 NOTE — Patient Instructions (Signed)
Continue on current regimen  Follow up with Dr. Alva  In 3 months and As needed    

## 2015-05-22 NOTE — Progress Notes (Signed)
PFT done today. 

## 2015-05-22 NOTE — Assessment & Plan Note (Signed)
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  He is clinically improved since last ov with d/c of ACE  Cough is much better.  He is scheduled for an ablation next week, hopefully wil lbe able to stop Amiodarone after that.  O2 sats are good on RA.   Plan  Cont on current regimen  Hold on steroids for now as cough is better .  Plan for repeat PFT in 1 year.or sooner if needed.  follow up Dr. Elsworth Soho  In 3 months and As needed

## 2015-05-22 NOTE — Progress Notes (Signed)
Subjective:    Patient ID: Curtis Flores, male    DOB: March 29, 1955, 60 y.o.   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 These areas demonstrate internal interstitial prominence with a large amount of microcystic honeycombingwith some cylindrical bronchiectasis and bronchiolectasis. moderate hiatal hernia was also noted.   PFTs 2013 showed intraprenchymal restricton with FVC around 71% & DLCO 56% c/w ILD, no airway obstruction.  Rpt PFTs 10/2012 show mild drop in FVC to 66%, DLCO preserved  05/22/2015 Follow up Pulmonary Fibrosis  Pt returns for 1 month follow up . Seen last ov for follow up for  Pulmonary fibrosis.  Changed from ACE to losartan last ov .  Cough is much better since stopping ACE inhibotor.  PFT FEV1 70%, ratio 82, FVC 66%, no BD response, DLCO 46%. DLCO has decreased from 2014.  CT chest 05/12/15 showed ILD/NSIP , slight progression compared from 2013.  Recommended Amiodarone be changed. He has seen Dr. Rayann Heman, with plans to have Ablation next week and hopefully d/c Amiodarone after.  He says he is feeling better since last seen.  Denies hemoptysis , chest pain, orthopnea or increased leg swelling.   Past Medical History  Diagnosis Date  . Psoriasis 2003  . Rheumatoid arthritis(714.0) 2012  . Psoriatic arthritis ([place])   . Leukocytosis   . Lymphadenopathy   . Pulmonary fibrosis ([place])   . Hypertension   . GERD (gastroesophageal reflux disease)   . Hyperlipidemia   .  Coronary artery disease     lateral STEMI 02/22/15  . Typical atrial flutter ([place])   . LBBB (left bundle branch block)   . Ischemic cardiomyopathy   . Chronic systolic dysfunction of left ventricle    Current Outpatient Prescriptions on File Prior to Visit  Medication Sig Dispense Refill  . amiodarone (PACERONE) 200 MG tablet Take 1 tablet (200 mg total) by mouth daily. 30 tablet 3  . apixaban (ELIQUIS) 5 MG TABS tablet Take 1 tablet (5 mg total) by mouth 2 (two) times daily. 60 tablet 6  . Apremilast (OTEZLA) 30 MG TABS Take 1 tablet by mouth 2 (two) times daily.    [name] atorvastatin (LIPITOR) 80 MG tablet Take 1 tablet (80 mg total) by mouth daily at 6 PM. 30 tablet 6  . carvedilol (COREG) 12.5 MG tablet Take 1 tablet (12.5 mg total) by mouth 2 (two) times daily. 60 tablet 3  . clopidogrel (PLAVIX) 75 MG tablet Take 1 tablet (75 mg total) by mouth daily. 30 tablet 6  . furosemide (LASIX) 40 MG tablet Take 1 tablet (40 mg total) by mouth 2 (two) times daily. 60 tablet 3  . leflunomide (ARAVA) 20 MG tablet Take 20 mg by mouth daily.     [name] losartan (COZAAR) 50 MG tablet TAKE 1 TABLET (50 MG TOTAL) BY MOUTH DAILY. 30 tablet 5  . pantoprazole (PROTONIX) 40 MG tablet Take 1 tablet (40 mg total) by mouth daily. 30 tablet 11  No current facility-administered medications on file prior to visit.       Review of Systems Constitutional:   No  weight loss, night sweats,  Fevers, chills,  +fatigue, or  lassitude.  HEENT:   No headaches,  Difficulty swallowing,  Tooth/dental problems, or  Sore throat,                No sneezing, itching, ear ache, nasal congestion, post nasal drip,   CV:  No chest pain,  Orthopnea, PND, swelling in lower extremities, anasarca, dizziness, palpitations, syncope.   GI  No heartburn, indigestion, abdominal pain, nausea, vomiting, diarrhea, change in bowel habits, loss of appetite, bloody stools.   Resp:    No chest wall deformity  Skin: no rash or lesions.  GU:  no dysuria, change in color of urine, no urgency or frequency.  No flank pain, no hematuria   MS:  No joint pain or swelling.  No decreased range of motion.  No back pain.  Psych:  No change in mood or affect. No depression or anxiety.  No memory loss.         Objective:   Physical Exam  Filed Vitals:   05/22/15 1421  BP: 120/60  Pulse: 60  Height: '5\' 7"'  (1.702 m)  Weight: 163 lb (73.936 kg)  SpO2: 95%   GEN: A/Ox3; pleasant , NAD, well nourished   HEENT:  [place]/AT,  EACs-clear, TMs-wnl, NOSE-clear, THROAT-clear, no lesions, no postnasal drip or exudate noted.   NECK:  Supple w/ fair ROM; no JVD; normal carotid impulses w/o bruits; no thyromegaly or nodules palpated; no lymphadenopathy.  RESP  Faint BB crackles , no accessory muscle use, no dullness to percussion  CARD:  RRR, no m/r/g  , no peripheral edema, pulses intact, no cyanosis or clubbing.  GI:   Soft & nt; nml bowel sounds; no organomegaly or masses detected.  Musco: Warm bil, no deformities or joint swelling noted.   Neuro: alert, no focal deficits noted.    Skin: Warm, no lesions or rashes   Alanna Storti NP-C   Pulmonary and Critical Care  05/22/15      Assessment & Plan:

## 2015-05-24 ENCOUNTER — Ambulatory Visit (HOSPITAL_COMMUNITY)
Admission: RE | Admit: 2015-05-24 | Discharge: 2015-05-24 | Disposition: A | Discharge status: Home / Self Care | Payer: Medicare Other | Source: Ambulatory Visit | Attending: Cardiovascular Disease | Admitting: Cardiovascular Disease

## 2015-05-24 DIAGNOSIS — I4892 Unspecified atrial flutter: Secondary | ICD-10-CM

## 2015-05-24 DIAGNOSIS — I447 Left bundle-branch block, unspecified: Secondary | ICD-10-CM | POA: Insufficient documentation

## 2015-05-24 DIAGNOSIS — I519 Heart disease, unspecified: Secondary | ICD-10-CM | POA: Diagnosis not present

## 2015-05-24 DIAGNOSIS — E785 Hyperlipidemia, unspecified: Secondary | ICD-10-CM | POA: Insufficient documentation

## 2015-05-24 DIAGNOSIS — R0609 Other forms of dyspnea: Secondary | ICD-10-CM | POA: Insufficient documentation

## 2015-05-24 DIAGNOSIS — Z955 Presence of coronary angioplasty implant and graft: Secondary | ICD-10-CM | POA: Diagnosis not present

## 2015-05-24 DIAGNOSIS — Z87891 Personal history of nicotine dependence: Secondary | ICD-10-CM | POA: Diagnosis not present

## 2015-05-24 DIAGNOSIS — R5383 Other fatigue: Secondary | ICD-10-CM | POA: Insufficient documentation

## 2015-05-24 DIAGNOSIS — Z8249 Family history of ischemic heart disease and other diseases of the circulatory system: Secondary | ICD-10-CM | POA: Diagnosis not present

## 2015-05-24 DIAGNOSIS — I119 Hypertensive heart disease without heart failure: Secondary | ICD-10-CM | POA: Insufficient documentation

## 2015-05-24 LAB — MYOCARDIAL PERFUSION IMAGING
CHL CUP NUCLEAR SDS: 6
CHL CUP NUCLEAR SRS: 8
CHL CUP NUCLEAR SSS: 14
LV sys vol: 160 mL
LVDIAVOL: 236 mL (ref 62–150)
Peak HR: 77 {beats}/min
Rest HR: 64 {beats}/min
TID: 1.09

## 2015-05-24 MED ORDER — REGADENOSON 0.4 MG/5ML IV SOLN
0.4000 mg | Freq: Once | INTRAVENOUS | Status: AC
  Administered 2015-05-24: 0.4 mg via INTRAVENOUS

## 2015-05-24 MED ORDER — TECHNETIUM TC 99M SESTAMIBI GENERIC - CARDIOLITE
10.3000 | Freq: Once | INTRAVENOUS | Status: AC | PRN
  Administered 2015-05-24: 10.3 via INTRAVENOUS

## 2015-05-24 MED ORDER — AMINOPHYLLINE 25 MG/ML IV SOLN
75.0000 mg | Freq: Once | INTRAVENOUS | Status: AC
  Administered 2015-05-24: 75 mg via INTRAVENOUS

## 2015-05-24 MED ORDER — TECHNETIUM TC 99M TETROFOSMIN IV KIT
31.3000 | PACK | Freq: Once | INTRAVENOUS | Status: AC | PRN
  Administered 2015-05-24: 31.3 via INTRAVENOUS

## 2015-05-29 ENCOUNTER — Other Ambulatory Visit (HOSPITAL_COMMUNITY): Payer: Medicare Other

## 2015-05-29 ENCOUNTER — Other Ambulatory Visit: Payer: Self-pay | Admitting: Physician Assistant

## 2015-05-29 NOTE — Anesthesia Preprocedure Evaluation (Addendum)
Anesthesia Evaluation  Patient identified by MRN, date of birth, ID band Patient awake    Reviewed: Allergy & Precautions, H&P , NPO status , Patient's Chart, lab work & pertinent test results  Airway Mallampati: II  TM Distance: >3 FB Neck ROM: Full    Dental no notable dental hx. (+) Edentulous Upper, Dental Advisory Given   Pulmonary neg pulmonary ROS, former smoker,  Pulmonary fibrosis, FVC 70%   Pulmonary exam normal breath sounds clear to auscultation       Cardiovascular hypertension, Pt. on home beta blockers + CAD, + Past MI, + Cardiac Stents and +CHF  + dysrhythmias Atrial Fibrillation and Supra Ventricular Tachycardia  Rhythm:Irregular Rate:Normal  STRESS 05/24/2015 EF 32%, ECHO 04/2015 EF 20%, Diffuse cardiomyopathy, STENTS 12/20,     Neuro/Psych negative neurological ROS  negative psych ROS   GI/Hepatic Neg liver ROS, GERD  Medicated and Controlled,  Endo/Other  negative endocrine ROS  Renal/GU negative Renal ROS  negative genitourinary   Musculoskeletal  (+) Arthritis , Rheumatoid disorders,    Abdominal   Peds  Hematology negative hematology ROS (+) 12/36   Anesthesia Other Findings   Reproductive/Obstetrics negative OB ROS                           Anesthesia Physical Anesthesia Plan  ASA: III  Anesthesia Plan: General   Post-op Pain Management:    Induction: Intravenous  Airway Management Planned: LMA  Additional Equipment:   Intra-op Plan:   Post-operative Plan: Extubation in OR  Informed Consent: I have reviewed the patients History and Physical, chart, labs and discussed the procedure including the risks, benefits and alternatives for the proposed anesthesia with the patient or authorized representative who has indicated his/her understanding and acceptance.   Dental advisory given  Plan Discussed with: CRNA, Anesthesiologist and Surgeon  Anesthesia Plan  Comments:        Anesthesia Quick Evaluation

## 2015-05-29 NOTE — Progress Notes (Signed)
This encounter was created in error - please disregard.

## 2015-05-30 ENCOUNTER — Encounter (HOSPITAL_COMMUNITY)
Admission: RE | Disposition: A | Discharge status: Home / Self Care | Payer: Self-pay | Source: Ambulatory Visit | Attending: Internal Medicine

## 2015-05-30 ENCOUNTER — Ambulatory Visit (HOSPITAL_COMMUNITY): Payer: Medicare Other | Admitting: Anesthesiology

## 2015-05-30 ENCOUNTER — Ambulatory Visit (HOSPITAL_COMMUNITY)
Admission: RE | Admit: 2015-05-30 | Discharge: 2015-05-30 | Disposition: A | Discharge status: Home / Self Care | Payer: Medicare Other | Source: Ambulatory Visit | Attending: Internal Medicine | Admitting: Internal Medicine

## 2015-05-30 ENCOUNTER — Encounter (HOSPITAL_COMMUNITY): Payer: Self-pay | Admitting: General Practice

## 2015-05-30 ENCOUNTER — Encounter (HOSPITAL_COMMUNITY): Payer: Medicare Other

## 2015-05-30 DIAGNOSIS — I447 Left bundle-branch block, unspecified: Secondary | ICD-10-CM | POA: Insufficient documentation

## 2015-05-30 DIAGNOSIS — E785 Hyperlipidemia, unspecified: Secondary | ICD-10-CM | POA: Diagnosis not present

## 2015-05-30 DIAGNOSIS — M069 Rheumatoid arthritis, unspecified: Secondary | ICD-10-CM | POA: Insufficient documentation

## 2015-05-30 DIAGNOSIS — I5022 Chronic systolic (congestive) heart failure: Secondary | ICD-10-CM | POA: Diagnosis not present

## 2015-05-30 DIAGNOSIS — I252 Old myocardial infarction: Secondary | ICD-10-CM | POA: Insufficient documentation

## 2015-05-30 DIAGNOSIS — I483 Typical atrial flutter: Secondary | ICD-10-CM | POA: Insufficient documentation

## 2015-05-30 DIAGNOSIS — Z7901 Long term (current) use of anticoagulants: Secondary | ICD-10-CM | POA: Insufficient documentation

## 2015-05-30 DIAGNOSIS — Z7902 Long term (current) use of antithrombotics/antiplatelets: Secondary | ICD-10-CM | POA: Diagnosis not present

## 2015-05-30 DIAGNOSIS — Z79899 Other long term (current) drug therapy: Secondary | ICD-10-CM | POA: Insufficient documentation

## 2015-05-30 DIAGNOSIS — K219 Gastro-esophageal reflux disease without esophagitis: Secondary | ICD-10-CM | POA: Insufficient documentation

## 2015-05-30 DIAGNOSIS — I4892 Unspecified atrial flutter: Secondary | ICD-10-CM | POA: Diagnosis not present

## 2015-05-30 DIAGNOSIS — I480 Paroxysmal atrial fibrillation: Secondary | ICD-10-CM | POA: Diagnosis present

## 2015-05-30 DIAGNOSIS — I2510 Atherosclerotic heart disease of native coronary artery without angina pectoris: Secondary | ICD-10-CM | POA: Diagnosis not present

## 2015-05-30 DIAGNOSIS — J8410 Pulmonary fibrosis, unspecified: Secondary | ICD-10-CM | POA: Diagnosis not present

## 2015-05-30 DIAGNOSIS — I255 Ischemic cardiomyopathy: Secondary | ICD-10-CM | POA: Diagnosis not present

## 2015-05-30 DIAGNOSIS — I110 Hypertensive heart disease with heart failure: Secondary | ICD-10-CM | POA: Diagnosis not present

## 2015-05-30 HISTORY — DX: Acute myocardial infarction, unspecified: I21.9

## 2015-05-30 HISTORY — PX: ELECTROPHYSIOLOGIC STUDY: SHX172A

## 2015-05-30 SURGERY — A-FLUTTER/A-TACH/SVT ABLATION
Anesthesia: General

## 2015-05-30 MED ORDER — LIDOCAINE HCL (CARDIAC) 20 MG/ML IV SOLN
INTRAVENOUS | Status: DC | PRN
  Administered 2015-05-30: 40 mg via INTRAVENOUS

## 2015-05-30 MED ORDER — PROPOFOL 10 MG/ML IV BOLUS
INTRAVENOUS | Status: DC | PRN
  Administered 2015-05-30: 120 mg via INTRAVENOUS

## 2015-05-30 MED ORDER — LEFLUNOMIDE 20 MG PO TABS
20.0000 mg | ORAL_TABLET | Freq: Every day | ORAL | Status: DC
  Filled 2015-05-30: qty 1

## 2015-05-30 MED ORDER — ROCURONIUM BROMIDE 100 MG/10ML IV SOLN
INTRAVENOUS | Status: DC | PRN
  Administered 2015-05-30: 10 mg via INTRAVENOUS
  Administered 2015-05-30: 40 mg via INTRAVENOUS
  Administered 2015-05-30: 10 mg via INTRAVENOUS

## 2015-05-30 MED ORDER — PHENYLEPHRINE HCL 10 MG/ML IJ SOLN
10.0000 mg | INTRAVENOUS | Status: DC | PRN
  Administered 2015-05-30: 40 ug/min via INTRAVENOUS

## 2015-05-30 MED ORDER — HYDROCODONE-ACETAMINOPHEN 5-325 MG PO TABS: 1.0000 | ORAL_TABLET | ORAL | Status: DC | PRN

## 2015-05-30 MED ORDER — MEPERIDINE HCL 25 MG/ML IJ SOLN: 6.2500 mg | INTRAMUSCULAR | Status: DC | PRN

## 2015-05-30 MED ORDER — APREMILAST 30 MG PO TABS: 1.0000 | ORAL_TABLET | Freq: Two times a day (BID) | ORAL | Status: DC

## 2015-05-30 MED ORDER — SODIUM CHLORIDE 0.9 % IV SOLN: 250.0000 mL | INTRAVENOUS | Status: DC | PRN

## 2015-05-30 MED ORDER — SODIUM CHLORIDE 0.9% FLUSH: 3.0000 mL | INTRAVENOUS | Status: DC | PRN

## 2015-05-30 MED ORDER — SODIUM CHLORIDE 0.9 % IV SOLN
2.0000 ug/min | INTRAVENOUS | Status: AC
  Administered 2015-05-30: 2 ug/min via INTRAVENOUS
  Filled 2015-05-30: qty 2

## 2015-05-30 MED ORDER — LACTATED RINGERS IV SOLN: INTRAVENOUS | Status: DC | PRN

## 2015-05-30 MED ORDER — ACETAMINOPHEN 325 MG PO TABS: 650.0000 mg | ORAL_TABLET | ORAL | Status: DC | PRN

## 2015-05-30 MED ORDER — SODIUM CHLORIDE 0.9% FLUSH
3.0000 mL | Freq: Two times a day (BID) | INTRAVENOUS | Status: DC
Start: 2015-05-30 — End: 2015-05-30

## 2015-05-30 MED ORDER — BUPIVACAINE HCL (PF) 0.25 % IJ SOLN
INTRAMUSCULAR | Status: AC
  Filled 2015-05-30: qty 30

## 2015-05-30 MED ORDER — BUPIVACAINE HCL (PF) 0.25 % IJ SOLN
INTRAMUSCULAR | Status: DC | PRN
  Administered 2015-05-30: 10 mL

## 2015-05-30 MED ORDER — ONDANSETRON HCL 4 MG/2ML IJ SOLN: 4.0000 mg | Freq: Four times a day (QID) | INTRAMUSCULAR | Status: DC | PRN

## 2015-05-30 MED ORDER — SUGAMMADEX SODIUM 200 MG/2ML IV SOLN
INTRAVENOUS | Status: DC | PRN
  Administered 2015-05-30: 200 mg via INTRAVENOUS

## 2015-05-30 MED ORDER — PHENYLEPHRINE HCL 10 MG/ML IJ SOLN
INTRAMUSCULAR | Status: DC | PRN
  Administered 2015-05-30: 80 ug via INTRAVENOUS
  Administered 2015-05-30: 40 ug via INTRAVENOUS

## 2015-05-30 MED ORDER — ONDANSETRON HCL 4 MG/2ML IJ SOLN
INTRAMUSCULAR | Status: DC | PRN
  Administered 2015-05-30: 4 mg via INTRAVENOUS

## 2015-05-30 MED ORDER — SODIUM CHLORIDE 0.9 % IV SOLN
INTRAVENOUS | Status: DC | PRN
  Administered 2015-05-30: 10:00:00 via INTRAVENOUS

## 2015-05-30 MED ORDER — FENTANYL CITRATE (PF) 100 MCG/2ML IJ SOLN
INTRAMUSCULAR | Status: DC | PRN
  Administered 2015-05-30: 25 ug via INTRAVENOUS

## 2015-05-30 MED ORDER — FENTANYL CITRATE (PF) 100 MCG/2ML IJ SOLN: 25.0000 ug | INTRAMUSCULAR | Status: DC | PRN

## 2015-05-30 MED ORDER — CARVEDILOL 12.5 MG PO TABS: 6.2500 mg | ORAL_TABLET | Freq: Two times a day (BID) | ORAL | Status: DC

## 2015-05-30 MED ORDER — CLOPIDOGREL BISULFATE 75 MG PO TABS: 75.0000 mg | ORAL_TABLET | Freq: Every day | ORAL | Status: DC

## 2015-05-30 SURGICAL SUPPLY — 13 items
BAG SNAP BAND KOVER 36X36 (MISCELLANEOUS) ×3 IMPLANT
BLANKET WARM UNDERBOD FULL ACC (MISCELLANEOUS) ×3 IMPLANT
CATH BLAZERPRIME XP LG CV 10MM (ABLATOR) ×2 IMPLANT
CATH JOSEPH QUAD ALLRED 6F REP (CATHETERS) ×2 IMPLANT
CATH WEB BIDIR CS D-F NONAUTO (CATHETERS) ×3 IMPLANT
COVER SWIFTLINK CONNECTOR (BAG) ×3 IMPLANT
PACK EP LATEX FREE (CUSTOM PROCEDURE TRAY) ×3
PACK EP LF (CUSTOM PROCEDURE TRAY) ×1 IMPLANT
PAD DEFIB LIFELINK (PAD) ×3 IMPLANT
SHEATH PINNACLE 6F 10CM (SHEATH) ×2 IMPLANT
SHEATH PINNACLE 7F 10CM (SHEATH) ×2 IMPLANT
SHEATH PINNACLE 8F 10CM (SHEATH) ×3 IMPLANT
SHIELD RADPAD SCOOP 12X17 (MISCELLANEOUS) ×3 IMPLANT

## 2015-05-30 NOTE — Progress Notes (Signed)
Doing well s/p ablation No complaints VSS  Will discharge at 6:30 pm today. Follow-up with me in 4 weeks  Stop amiodarone Stop eliquis Reduce coreg to 6.25mg  BID  [name] MD, [facility] 05/30/2015 3:22 PM

## 2015-05-30 NOTE — Progress Notes (Addendum)
Site area: RFV x3 Site Prior to Removal:  Level 0 Pressure Applied For:20 min Manual:  yes  Patient Status During Pull:  stable Post Pull Site:  Level 0 Post Pull Instructions Given:   yes Post Pull Pulses Present: n/a Dressing Applied: tegaderm  Bedrest begins @ [id number] till 1855 Comments:

## 2015-05-30 NOTE — Discharge Instructions (Signed)
No driving for 4 days. No lifting over 5 lbs for 1 week. No vigorous or sexual activity for 1 week. You may return to work on 06/06/15. Keep procedure site clean & dry. If you notice increased pain, swelling, bleeding or pus, call/return!  You may shower, but no soaking baths/hot tubs/pools for 1 week.

## 2015-05-30 NOTE — Progress Notes (Signed)
Reviewed & agree with plan  

## 2015-05-30 NOTE — Anesthesia Procedure Notes (Signed)
Procedure Name: Intubation Date/Time: 05/30/2015 10:49 AM Performed by: Carney Living Pre-anesthesia Checklist: Patient identified, Emergency Drugs available, Suction available, Patient being monitored and Timeout performed Patient Re-evaluated:Patient Re-evaluated prior to inductionOxygen Delivery Method: Circle system utilized Preoxygenation: Pre-oxygenation with 100% oxygen Intubation Type: IV induction Ventilation: Mask ventilation without difficulty Laryngoscope Size: Mac and 4 Grade View: Grade I Tube type: Oral Tube size: 7.0 mm Number of attempts: 1 Airway Equipment and Method: Stylet Placement Confirmation: ETT inserted through vocal cords under direct vision,  positive ETCO2 and breath sounds checked- equal and bilateral Secured at: 22 cm Tube secured with: Tape Dental Injury: Teeth and Oropharynx as per pre-operative assessment

## 2015-05-30 NOTE — Transfer of Care (Signed)
Immediate Anesthesia Transfer of Care Note  Patient: Curtis Flores  Procedure(s) Performed: Procedure(s): A-Flutter (N/A)  Patient Location: PACU  Anesthesia Type:General  Level of Consciousness: awake, alert , oriented and patient cooperative  Airway & Oxygen Therapy: Patient Spontanous Breathing and Patient connected to nasal cannula oxygen  Post-op Assessment: Report given to RN, Post -op Vital signs reviewed and stable and Patient moving all extremities X 4  Post vital signs: Reviewed and stable  Last Vitals:  Filed Vitals:   05/30/15 0749 05/30/15 1220  BP: 156/76 186/66  Pulse: 63 60  Temp: 36.6 C   Resp: 18 24    Complications: No apparent anesthesia complications

## 2015-05-30 NOTE — Anesthesia Postprocedure Evaluation (Signed)
Anesthesia Post Note  Patient: MEKHAI GAYMON  Procedure(s) Performed: Procedure(s) (LRB): A-Flutter (N/A)  Patient location during evaluation: PACU Anesthesia Type: General Level of consciousness: awake and alert Pain management: pain level controlled Vital Signs Assessment: post-procedure vital signs reviewed and stable Respiratory status: spontaneous breathing, nonlabored ventilation, respiratory function stable and patient connected to nasal cannula oxygen Cardiovascular status: blood pressure returned to baseline and stable Postop Assessment: no signs of nausea or vomiting Anesthetic complications: no    Last Vitals:  Filed Vitals:   05/30/15 1250 05/30/15 1255  BP: 181/77   Pulse: 56 54  Temp:    Resp: 17 16    Last Pain: There were no vitals filed for this visit.               Alexis Frock

## 2015-05-30 NOTE — Interval H&P Note (Signed)
History and Physical Interval Note:  Myoview discussed with patient.  EF remains depressed.  No ischemic by myoview.  Medical management preferred.  05/30/2015 10:30 AM  [name]  has presented today for surgery, with the diagnosis of a flutter  The various methods of treatment have been discussed with the patient and family. After consideration of risks, benefits and other options for treatment, the patient has consented to  EP study and ablation for tachycardia as a surgical intervention .  The patient's history has been reviewed, patient examined, no change in status, stable for surgery.  I have reviewed the patient's chart and labs.  Questions were answered to the patient's satisfaction.     Curtis Flores

## 2015-05-30 NOTE — Progress Notes (Signed)
05/30/2015 6:36 PM Discharge AVS meds taken today and those due this evening reviewed.  Follow-up appointments and when to call md reviewed.  D/C IV and TELE.  Questions and concerns addressed.   D/C home per orders. Carney Corners

## 2015-05-30 NOTE — H&P (View-Only) (Signed)
Electrophysiology Office Note   Date:  05/18/2015   ID:  Curtis Flores, DOB 08-Jul-1955, MRN [id number]  PCP:  [name], MD  Cardiologist:  Dr [name] Primary Electrophysiologist: [name], MD    Chief Complaint  Patient presents with  . Atrial Flutter     History of Present Illness: Curtis Flores is a 60 y.o. male who presents today for electrophysiology evaluation.   See my consult note from 2/17 for full details.  He reports that he is maintaining sinus rhythm with amidoarone but has fatigue with this medicine.  Also reports some BRBPR with eliquis.  Also requires antiplatelet therapy.  Today, he denies symptoms of palpitations, chest pain, shortness of breath, orthopnea, PND, lower extremity edema, claudication, dizziness, presyncope, syncope, bleeding, or neurologic sequela. The patient is tolerating medications without difficulties and is otherwise without complaint today.    Past Medical History  Diagnosis Date  . Psoriasis 2003  . Rheumatoid arthritis(714.0) 2012  . Psoriatic arthritis ([place])   . Leukocytosis   . Lymphadenopathy   . Pulmonary fibrosis ([place])   . Hypertension   . GERD (gastroesophageal reflux disease)   . Hyperlipidemia   . Coronary artery disease     lateral STEMI 02/22/15  . Typical atrial flutter ([place])   . LBBB (left bundle branch block)   . Ischemic cardiomyopathy   . Chronic systolic dysfunction of left ventricle    Past Surgical History  Procedure Laterality Date  . Cardiac catheterization N/A 02/22/2015    Procedure: Left Heart Cath and Coronary Angiography;  Surgeon: Lorretta Harp, MD;  Location: [place] [facility];  Service: Cardiovascular;  Laterality: N/A;  . Cardiac catheterization N/A 02/22/2015    Procedure: Coronary Stent Intervention;  Surgeon: Lorretta Harp, MD;  Location: [place] [facility];  Service: Cardiovascular;  Laterality: N/A;  . Tee without cardioversion N/A 04/12/2015    Procedure: TRANSESOPHAGEAL  ECHOCARDIOGRAM (TEE);  Surgeon: Larey Dresser, MD;  Location: [place];  Service: Cardiovascular;  Laterality: N/A;  . Cardioversion N/A 04/12/2015    Procedure: CARDIOVERSION;  Surgeon: Larey Dresser, MD;  Location: [place];  Service: Cardiovascular;  Laterality: N/A;     Current Outpatient Prescriptions  Medication Sig Dispense Refill  . amiodarone (PACERONE) 200 MG tablet Take 1 tablet (200 mg total) by mouth daily. 30 tablet 3  . apixaban (ELIQUIS) 5 MG TABS tablet Take 1 tablet (5 mg total) by mouth 2 (two) times daily. 60 tablet 6  . Apremilast (OTEZLA) 30 MG TABS Take 1 tablet by mouth 2 (two) times daily.    [name] atorvastatin (LIPITOR) 80 MG tablet Take 1 tablet (80 mg total) by mouth daily at 6 PM. 30 tablet 6  . carvedilol (COREG) 12.5 MG tablet Take 1 tablet (12.5 mg total) by mouth 2 (two) times daily. 60 tablet 3  . clopidogrel (PLAVIX) 75 MG tablet Take 1 tablet (75 mg total) by mouth daily. 30 tablet 6  . furosemide (LASIX) 40 MG tablet Take 1 tablet (40 mg total) by mouth 2 (two) times daily. 60 tablet 3  . leflunomide (ARAVA) 20 MG tablet Take 20 mg by mouth daily.     [name] losartan (COZAAR) 50 MG tablet TAKE 1 TABLET (50 MG TOTAL) BY MOUTH DAILY. 30 tablet 5  . pantoprazole (PROTONIX) 40 MG tablet Take 1 tablet (40 mg total) by mouth daily. 30 tablet 11   No current facility-administered medications for this visit.    Allergies:  Review of patient's allergies indicates no known allergies.   Social History:  The patient  reports that he quit smoking about 12 years ago. His smoking use included Cigarettes. He has a 30 pack-year smoking history. He has never used smokeless tobacco. He reports that he does not drink alcohol or use illicit drugs.   Family History:  The patient's  family history includes Hypertension in his mother.    ROS:  Please see the history of present illness.   All other systems are reviewed and negative.    PHYSICAL EXAM: VS:  BP 114/50 mmHg   Pulse 62  Ht 5\' 7"  (1.702 m)  Wt 164 lb (74.39 kg)  BMI 25.68 kg/m2 , BMI Body mass index is 25.68 kg/(m^2). GEN: Well nourished, well developed, in no acute distress HEENT: normal Neck: no JVD, carotid bruits, or masses Cardiac: RRR; no murmurs, rubs, or gallops,no edema  Respiratory:  clear to auscultation bilaterally, normal work of breathing GI: soft, nontender, nondistended, + BS MS: no deformity or atrophy Skin: warm and dry  Neuro:  Strength and sensation are intact Psych: euthymic mood, full affect  EKG:  EKG is ordered today. The ekg ordered today shows sinus rhythm   Recent Labs: 02/22/2015: ALT 18 04/07/2015: B Natriuretic Peptide 339.0*; Magnesium 1.7 04/11/2015: TSH 1.188 04/13/2015: BUN 10; Creatinine, Ser 0.73; Potassium 3.9; Sodium 133* 05/09/2015: Hemoglobin 11.7*; Platelets 287    Lipid Panel     Component Value Date/Time   CHOL 80 02/22/2015 2135   TRIG 61 02/22/2015 2135   HDL 20* 02/22/2015 2135   CHOLHDL 4.0 02/22/2015 2135   VLDL 12 02/22/2015 2135   LDLCALC 48 02/22/2015 2135     Wt Readings from Last 3 Encounters:  05/18/15 164 lb (74.39 kg)  05/09/15 164 lb (74.39 kg)  04/19/15 169 lb 6.4 oz (76.839 kg)      Other studies Reviewed: Additional studies/ records that were reviewed today include: my consult note, Dr Kennon Holter recent note    ASSESSMENT AND PLAN:  1. Typical atrial Flutter The patient was recently hospitalized with rapidly conducting atrial flutter. Despite pulmonary fibrosis and prior lung issues, he was placed on amiodarone. He was cardioverted and is now maintaining sinus with eliquis.  He is not tolerating eliquis or amiodarone well. Therapeutic strategies for atrial flutter including medicine and ablation were discussed in detail with the patient today. Risk, benefits, and alternatives to EP study and radiofrequency ablation were also discussed in detail today. These risks include but are not limited to stroke, bleeding,  vascular damage, tamponade, perforation, damage to the heart and other structures, AV block requiring pacemaker, worsening renal function, and death. The patient understands these risk and wishes to proceed.  We will therefore proceed with catheter ablation at the next available time.  Anesthesia will be required for the procedure.   2. CAD Recent PCI No ischemic symptoms Continue eliquis and plavix Hopefully post ablation we can stop eliquis and return to ASA and plavix long term  3. Ischemic CM/ chronic systolic dysfunction/ LBBB Continue to optimize medicines as an outpatient Hopefully EF will improve with sinus rhythm (though I doubt that it will normalize--> prior EF 40%)    Current medicines are reviewed at length with the patient today.   The patient does not have concerns regarding his medicines.  The following changes were made today:  none   Signed, [name], MD  05/18/2015 9:25 AM     [facility] [address]  [address] [place] [address] [phone] (office) [phone] (fax)

## 2015-05-30 NOTE — Progress Notes (Signed)
05/30/2015 1320 Received pt to room 2W12 from CATH LAB.  Pt is A&O, no C/O voiced.  Tele monitor applied and CCMD notified.  R groin site assessed with cath lab nurse, site is Level 0.  Oriented pt to room, call light and bed.  Call bell in reach.   Carney Corners

## 2015-05-31 ENCOUNTER — Encounter (HOSPITAL_COMMUNITY): Payer: Self-pay | Admitting: Internal Medicine

## 2015-05-31 ENCOUNTER — Ambulatory Visit (HOSPITAL_COMMUNITY): Payer: Medicare Other | Admitting: Hematology & Oncology

## 2015-05-31 NOTE — Progress Notes (Signed)
This encounter was created in error - please disregard.

## 2015-06-01 ENCOUNTER — Other Ambulatory Visit (HOSPITAL_COMMUNITY): Payer: Medicare Other

## 2015-06-01 ENCOUNTER — Ambulatory Visit: Payer: Medicare Other | Admitting: Nurse Practitioner

## 2015-06-06 ENCOUNTER — Encounter: Payer: Self-pay | Admitting: Gastroenterology

## 2015-06-06 ENCOUNTER — Ambulatory Visit (INDEPENDENT_AMBULATORY_CARE_PROVIDER_SITE_OTHER): Payer: Medicare Other | Admitting: Gastroenterology

## 2015-06-06 VITALS — BP 153/70 | HR 62 | Temp 98.0°F | Ht 67.0 in | Wt 164.2 lb

## 2015-06-06 DIAGNOSIS — I2121 ST elevation (STEMI) myocardial infarction involving left circumflex coronary artery: Secondary | ICD-10-CM

## 2015-06-06 DIAGNOSIS — D509 Iron deficiency anemia, unspecified: Secondary | ICD-10-CM | POA: Diagnosis not present

## 2015-06-06 DIAGNOSIS — R195 Other fecal abnormalities: Secondary | ICD-10-CM | POA: Insufficient documentation

## 2015-06-06 NOTE — Progress Notes (Signed)
Primary Care Physician:  [name], MD  Primary Gastroenterologist:  Garfield Cornea, MD   Chief Complaint  Patient presents with  . Blood In Stools    HPI:  Curtis Flores is a 60 y.o. male here for heme positive stool at the request of Dr. Edrick Oh. He had a ST elevation MI back in December, required PCI and DES. Started on DAPT with ASA and Brilinta initially.  Patient underwent cardioversion back in February 2017 for atrial fib/flutter. Echocardiogram in February showed worsening ejection fraction of 20%. Switched from ASA and Brilinta to Plavix and Eliquis. Amiodarone started. On 05/30/15 he underwent EP study and radiofrquency ablation for A.flutter. Patient states he was advised to stop Eliquis after the ablation. He is not sure if he is still on amiodarone. Right now just on Plavix, no ASA.  Patient was noted to have mild IDA last month by his PCP. Stool was heme +. He was started on iron supplements. Hgb has essentially been unchanged with recheck two weeks later. See below for labs. I have reviewed available labs in [facility] and patient had similar degree of anemia back in 10/2014 and with drop in MCV around 02/2015.   He denies GI symptoms. No constipation, diarrhea, melena, brbpr. No heartburn, n/v, appetite concerns. His last colonoscopy was in 2005. He has had remote EGD I do not have those records. He states he is still weak. He has not been able to go to cardiac rehab due to numerous office visits, test.     Current Outpatient Prescriptions  Medication Sig Dispense Refill  . Apremilast (OTEZLA) 30 MG TABS Take 1 tablet by mouth 2 (two) times daily.    [name] atorvastatin (LIPITOR) 80 MG tablet Take 1 tablet (80 mg total) by mouth daily at 6 PM. 30 tablet 6  . carvedilol (COREG) 12.5 MG tablet Take 0.5 tablets (6.25 mg total) by mouth 2 (two) times daily. 60 tablet 3  . clopidogrel (PLAVIX) 75 MG tablet Take 1 tablet (75 mg total) by mouth daily. 30 tablet 6  . ferrous sulfate 325  (65 FE) MG tablet Take 325 mg by mouth 3 (three) times daily with meals.    . furosemide (LASIX) 40 MG tablet Take 1 tablet (40 mg total) by mouth 2 (two) times daily. 60 tablet 3  . leflunomide (ARAVA) 20 MG tablet Take 20 mg by mouth daily.     [name] losartan (COZAAR) 50 MG tablet TAKE 1 TABLET (50 MG TOTAL) BY MOUTH DAILY. 30 tablet 5  . pantoprazole (PROTONIX) 40 MG tablet Take 1 tablet (40 mg total) by mouth daily. 30 tablet 11   No current facility-administered medications for this visit.    Allergies as of 06/06/2015  . (No Known Allergies)    Past Medical History  Diagnosis Date  . Psoriasis 2003  . Rheumatoid arthritis(714.0) 2012  . Psoriatic arthritis ([place])   . Leukocytosis     followed by hematology, reactive  . Lymphadenopathy   . Pulmonary fibrosis ([place])   . Hypertension   . GERD (gastroesophageal reflux disease)   . Hyperlipidemia   . Coronary artery disease     lateral STEMI 02/22/15  . Typical atrial flutter ([place])   . LBBB (left bundle branch block)   . Ischemic cardiomyopathy   . Chronic systolic dysfunction of left ventricle   . Myocardial infarction [facility]) 02/2015    Past Surgical History  Procedure Laterality Date  . Cardiac catheterization N/A 02/22/2015    Procedure: Left Heart  Cath and Coronary Angiography;  Surgeon: Lorretta Harp, MD;  Location: [place] [facility];  Service: Cardiovascular;  Laterality: N/A;  . Cardiac catheterization N/A 02/22/2015    Procedure: Coronary Stent Intervention;  Surgeon: Lorretta Harp, MD;  Location: [place] [facility];  Service: Cardiovascular;  Laterality: N/A;  . Tee without cardioversion N/A 04/12/2015    Procedure: TRANSESOPHAGEAL ECHOCARDIOGRAM (TEE);  Surgeon: Larey Dresser, MD;  Location: [place];  Service: Cardiovascular;  Laterality: N/A;  . Cardioversion N/A 04/12/2015    Procedure: CARDIOVERSION;  Surgeon: Larey Dresser, MD;  Location: [place];  Service: Cardiovascular;  Laterality: N/A;  .  Ablation of dysrhythmic focus  05/30/2014    atrial flutter  . Electrophysiologic study N/A 05/30/2015    Procedure: A-Flutter;  Surgeon: [name], MD;  Location: [place] [facility];  Service: Cardiovascular;  Laterality: N/A;  . Colonoscopy  07/01/2003    [id number] colonic mucosa except for the proximal right colon in the area of ICV which was not seen completely due to inadequate bowel prep. followed with ACBE which was normal.     Family History  Problem Relation Age of Onset  . Hypertension Mother   . Colon cancer Neg Hx     Social History   Social History  . Marital Status: Married    Spouse Name: N/A  . Number of Children: 2  . Years of Education: N/A   Occupational History  . unemployed     not working do to arthritis; used to be Patent attorney for a [employer] History Main Topics  . Smoking status: Former Smoker -- 1.00 packs/day for 30 years    Types: Cigarettes    Quit date: 03/21/2003  . Smokeless tobacco: Never Used  . Alcohol Use: No     Comment: H/O case of beer weekly x 20 years, quiting in 2000-ish.  . Drug Use: No     Comment: H/O marijuana use many years ago.  [name] Sexual Activity: Yes    Birth Control/ Protection: None   Other Topics Concern  . Not on file   Social History Narrative      ROS:  General: Negative for anorexia, weight loss, fever, chills. + fatigue, weakness. Eyes: Negative for vision changes.  ENT: Negative for hoarseness, difficulty swallowing , nasal congestion. CV: Negative for chest pain, angina, palpitations, dyspnea on exertion, peripheral edema.  Respiratory: Negative for dyspnea at rest, dyspnea on exertion, cough, sputum, wheezing.  GI: See history of present illness. GU:  Negative for dysuria, hematuria, urinary incontinence, urinary frequency, nocturnal urination.  MS: Negative for joint pain, low back pain.  Derm: Negative for rash or itching.  Neuro: Negative for weakness, abnormal sensation,  seizure, frequent headaches, memory loss, confusion.  Psych: Negative for anxiety, depression, suicidal ideation, hallucinations.  Endo: Negative for unusual weight change.  Heme: Negative for bruising or bleeding. Allergy: Negative for rash or hives.    Physical Examination:  BP 153/70 mmHg  Pulse 62  Temp(Src) 98 F (36.7 C) (Oral)  Ht 5\' 7"  (1.702 m)  Wt 164 lb 3.2 oz (74.481 kg)  BMI 25.71 kg/m2   General: Well-nourished, well-developed in no acute distress.  Head: Normocephalic, atraumatic.   Eyes: Conjunctiva pink, no icterus. Mouth: Oropharyngeal mucosa moist and pink , no lesions erythema or exudate. Neck: Supple without thyromegaly, masses, or lymphadenopathy.  Lungs: Clear to auscultation bilaterally.  Heart: Regular rate and rhythm, no murmurs rubs or gallops.  Abdomen: Bowel sounds are normal, nontender, nondistended, no hepatosplenomegaly or masses, no abdominal bruits or    hernia , no rebound or guarding.   Rectal: not performed  Extremities: No lower extremity edema. No clubbing or deformities.  Neuro: Alert and oriented x 4 , grossly normal neurologically.  Skin: Warm and dry, no rash or jaundice.   Psych: Alert and cooperative, normal mood and affect.  Labs: 05/10/2015: Hemoglobin 11.6 L, hematocrit 36.4L, MCV 77 L, TIBC 219 L, iron 35 low, iron saturation 16%, BUN 6, creatinine 0.59, LFTs normal except albumin 3.4, platelet count normal. 05/19/2015: Hemoglobin 11.4 low, hematocrit 37.4 low, MCV normal, TIBC 212 low, iron 27 low, iron saturations 13% low  Imaging Studies: Ct Chest High Resolution  05/12/2015  CLINICAL DATA:  60 year old male with possible wall fibrosis noted on outside prior chest x-ray. Evaluate for interstitial lung disease. Recent history of cough which has since resolved. No shortness of breath. Prior history of smoking. EXAM: CT CHEST WITHOUT CONTRAST TECHNIQUE: Multidetector CT imaging of the chest was performed following the standard  protocol without intravenous contrast. High resolution imaging of the lungs, as well as inspiratory and expiratory imaging, was performed. COMPARISON:  High-resolution chest CT 04/09/2011. FINDINGS: Mediastinum/Lymph Nodes: Heart size is mildly enlarged. There is no significant pericardial fluid, thickening or pericardial calcification. There is atherosclerosis of the thoracic aorta, the great vessels of the mediastinum and the coronary arteries, including calcified atherosclerotic plaque in the left main, left anterior descending, left circumflex and right coronary arteries. Multiple borderline enlarged and minimally enlarged mediastinal and hilar lymph nodes, measuring up to 1 cm in the subcarinal nodal station, similar to the prior study. Esophagus is unremarkable in appearance. No axillary lymphadenopathy. Lungs/Pleura: Again noted are patchy areas of ground-glass attenuation and septal thickening scattered throughout the lungs bilaterally. These findings are most evident in the lower lobes of the lungs, in a similar distribution to the prior study. The majority of the areas demonstrate sparing of the immediate subpleural interstitium. Extensive bronchiolectasis and microcystic honeycombing is noted throughout these areas. No definite macrocystic honeycombing noted. Some scattered very mild cylindrical bronchiectasis is noted. Overall, the burden of fibrotic changes in the lungs demonstrates minimal progression compared to the prior examination. The Inspiratory and expiratory imaging is unremarkable. No acute consolidative airspace disease. Trace left pleural effusion lying dependently. Upper abdomen: Unremarkable. Musculoskeletal: There are no aggressive appearing lytic or blastic lesions noted in the visualized portions of the skeleton. IMPRESSION: 1. Appearance of the lungs remains compatible with interstitial lung disease. 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 If there is clinical concern for potential usual interstitial pneumonia (UIP), open lung biopsy could be considered for further diagnostic evaluation. Sparing of the immediate subpleural interstitium would be highly unusual in the setting of UIP, however. 2. Mild cardiomegaly. 3. Atherosclerosis, including left main and 3 vessel coronary artery disease. Please note that although the presence of coronary artery calcium documents the presence of coronary artery disease, the severity of this disease and any potential stenosis cannot be assessed on this non-gated CT examination. Assessment for potential risk factor modification, dietary therapy or pharmacologic therapy may be warranted, if clinically indicated. Electronically Signed   By: Vinnie Langton M.D.   On: 05/12/2015 17:09

## 2015-06-06 NOTE — Patient Instructions (Signed)
1. Looks like Dr. Edrick Oh has you set up for labs for 06/19/15 or so. Please touch base with his office and make arrangements. I will follow up on labs as available. 2. Continue iron supplements as before.  3. Return to the office in two months. Call sooner if you start having black, tarry/sticky stools or rectal bleeding, feeling weak or lightheaded.

## 2015-06-07 ENCOUNTER — Telehealth: Payer: Self-pay | Admitting: Gastroenterology

## 2015-06-07 ENCOUNTER — Encounter: Payer: Self-pay | Admitting: Gastroenterology

## 2015-06-07 ENCOUNTER — Telehealth: Payer: Self-pay | Admitting: Cardiovascular Disease

## 2015-06-07 NOTE — Telephone Encounter (Signed)
Please call, pt was told yesterday by somebody he saw(she could not remember the name of the person)that he needs to take his aspirin. She said pt had stopped taking his aspirin for a while.He was told to check with Dr [name] to see if he wants him to take aspirin or not.Please let him know this asap.

## 2015-06-07 NOTE — Assessment & Plan Note (Signed)
60 year old gentleman with past medical history significant for psoriasis, rheumatoid arthritis on leflunomide, reactive leukocytosis, recent cardiac events as noted above who presents for iron deficiency anemia, Hemoccult-positive stool. Looking back through available labs, patient was noted to have similar hemoglobin back in August 2016. Noted to have a decline in MCV around December of last year. His numbers have been essentially stable. He has been on dual antiplatelet therapy with aspirin and Brilinta initially, this was then switched to Plavix and Eliquis. Currently on Plavix only. There has been no overt GI bleeding. Patient complains of significant fatigue in the setting of fairly recent MI, significantly declined LVEF. Recent ablation for A. Flutter.  I discussed appropriate workup in this setting to include colonoscopy with possible upper endoscopy. Patient feels that physically he cannot undergo the procedures at this time. Would recommend him continue pantoprazole and iron. Continue to monitor hemoglobin. We will have the patient come back in 2 months to see if he is ready to pursue workup at that time. Of course if he has any evidence of overt GI bleeding in the interim he will let us know immediately. He will also watch for decline in energy, increasing fatigue, dizziness. If he decides he wants to go ahead with procedures at any point he should call me.

## 2015-06-07 NOTE — Progress Notes (Signed)
cc'ed to pcp °

## 2015-06-07 NOTE — Telephone Encounter (Signed)
Tried to call pt- NA-LMOM to return call.  

## 2015-06-07 NOTE — Telephone Encounter (Signed)
Pt needed clarification on whether to take ASA 81mg  as he had done in the past. He saw GI specialist in [facility] yesterday. Per patient, ASA discussed at this visit. Patient unclear as to whether they just needed clarification on his med list or needed cardiology to review current med list. I reviewed and verified other meds as accurate. Pt is clear on other recent med changes following his ablation - wanted our recommendation on whether to be on ASA or not.  He follows up w/ Dr. Rayann Heman in 4 weeks. Routed for any recommendations.

## 2015-06-07 NOTE — Telephone Encounter (Signed)
Please let patient know that I reviewed available cardiology records. It appears that he should be on both Plavix and aspirin at this time. From a GI standpoint, benefits of Plavix/aspirin for protection of his recent stents outweigh the risk of bleeding. We can continue to monitor his hemoglobin and for overt GI bleeding.  Please tell patient to resume aspirin but also I would like for him to call his cardiologist to verify that they do intend for him to be on aspirin with his Plavix.  Forwarding note to Dr. Rayann Heman for FYI.

## 2015-06-07 NOTE — Telephone Encounter (Signed)
pts wife is aware and will call cardiologist.

## 2015-06-08 NOTE — Telephone Encounter (Signed)
Noted  

## 2015-06-08 NOTE — Telephone Encounter (Signed)
Note forwarded to Dr [name] (Primary cardiologist for his input)

## 2015-06-08 NOTE — Telephone Encounter (Signed)
I will forward to Dr [name] for his input.

## 2015-06-09 DIAGNOSIS — Z79899 Other long term (current) drug therapy: Secondary | ICD-10-CM | POA: Diagnosis not present

## 2015-06-19 DIAGNOSIS — E784 Other hyperlipidemia: Secondary | ICD-10-CM | POA: Diagnosis not present

## 2015-06-19 DIAGNOSIS — D649 Anemia, unspecified: Secondary | ICD-10-CM | POA: Diagnosis not present

## 2015-06-21 ENCOUNTER — Ambulatory Visit: Payer: Medicare Other | Admitting: Nurse Practitioner

## 2015-07-05 ENCOUNTER — Ambulatory Visit (INDEPENDENT_AMBULATORY_CARE_PROVIDER_SITE_OTHER): Payer: Medicare Other | Admitting: Internal Medicine

## 2015-07-05 ENCOUNTER — Ambulatory Visit (HOSPITAL_COMMUNITY): Payer: Medicare Other | Attending: Internal Medicine

## 2015-07-05 ENCOUNTER — Encounter: Payer: Self-pay | Admitting: Internal Medicine

## 2015-07-05 ENCOUNTER — Other Ambulatory Visit: Payer: Self-pay

## 2015-07-05 ENCOUNTER — Telehealth: Payer: Self-pay | Admitting: Internal Medicine

## 2015-07-05 VITALS — BP 142/56 | HR 56 | Ht 67.0 in | Wt 166.8 lb

## 2015-07-05 DIAGNOSIS — R29898 Other symptoms and signs involving the musculoskeletal system: Secondary | ICD-10-CM | POA: Insufficient documentation

## 2015-07-05 DIAGNOSIS — E785 Hyperlipidemia, unspecified: Secondary | ICD-10-CM | POA: Diagnosis not present

## 2015-07-05 DIAGNOSIS — I519 Heart disease, unspecified: Secondary | ICD-10-CM

## 2015-07-05 DIAGNOSIS — I2121 ST elevation (STEMI) myocardial infarction involving left circumflex coronary artery: Secondary | ICD-10-CM

## 2015-07-05 DIAGNOSIS — I340 Nonrheumatic mitral (valve) insufficiency: Secondary | ICD-10-CM | POA: Diagnosis not present

## 2015-07-05 DIAGNOSIS — I447 Left bundle-branch block, unspecified: Secondary | ICD-10-CM

## 2015-07-05 DIAGNOSIS — I119 Hypertensive heart disease without heart failure: Secondary | ICD-10-CM | POA: Insufficient documentation

## 2015-07-05 DIAGNOSIS — I4892 Unspecified atrial flutter: Secondary | ICD-10-CM

## 2015-07-05 DIAGNOSIS — Z955 Presence of coronary angioplasty implant and graft: Secondary | ICD-10-CM | POA: Diagnosis not present

## 2015-07-05 DIAGNOSIS — I2510 Atherosclerotic heart disease of native coronary artery without angina pectoris: Secondary | ICD-10-CM | POA: Diagnosis not present

## 2015-07-05 DIAGNOSIS — I071 Rheumatic tricuspid insufficiency: Secondary | ICD-10-CM | POA: Diagnosis not present

## 2015-07-05 MED ORDER — LOSARTAN POTASSIUM 100 MG PO TABS: 100.0000 mg | ORAL_TABLET | Freq: Every day | ORAL | Status: DC

## 2015-07-05 MED ORDER — LOSARTAN POTASSIUM 50 MG PO TABS: 50.0000 mg | ORAL_TABLET | Freq: Every day | ORAL | Status: DC

## 2015-07-05 NOTE — Telephone Encounter (Signed)
Losartan 100 mg daily 

## 2015-07-05 NOTE — Patient Instructions (Signed)
Medication Instructions:  Your physician has recommended you make the following change in your medication:  1)Stop Amiodarone 2) Increase Losartan to 100 mg daily   Labwork: None ordered   Testing/Procedures: None ordered   Follow-Up: Your physician recommends that you schedule a follow-up appointment with Dr Rayann Heman as needed if EF is 35% or higher by echo.  If EF is less than 35% will need a Bi-V ICD.   Any Other Special Instructions Will Be Listed Below (If Applicable).     If you need a refill on your cardiac medications before your next appointment, please call your pharmacy.

## 2015-07-05 NOTE — Progress Notes (Signed)
Electrophysiology Office Note   Date:  07/05/2015   ID:  Curtis Flores, DOB 01-20-1956, MRN [id number]  PCP:  Curtis Mustache, MD  Cardiologist:  Dr [name] Primary Electrophysiologist: Curtis Grayer, MD    No chief complaint on file.    History of Present Illness: Curtis Flores is a 60 y.o. male who presents today for electrophysiology evaluation.  Doing well s/p atrial flutter ablation. Denies symptoms of arrhythmia.  No procedure related complications. Today, he denies symptoms of palpitations, chest pain, shortness of breath, orthopnea, PND, lower extremity edema, claudication, dizziness, presyncope, syncope, bleeding, or neurologic sequela. The patient is tolerating medications without difficulties and is otherwise without complaint today.    Past Medical History  Diagnosis Date  . Psoriasis 2003  . Rheumatoid arthritis(714.0) 2012  . Psoriatic arthritis ([place])   . Leukocytosis     followed by hematology, reactive  . Lymphadenopathy   . Pulmonary fibrosis ([place])   . Hypertension   . GERD (gastroesophageal reflux disease)   . Hyperlipidemia   . Coronary artery disease     lateral STEMI 02/22/15  . Typical atrial flutter ([place])   . LBBB (left bundle branch block)   . Ischemic cardiomyopathy   . Chronic systolic dysfunction of left ventricle   . Myocardial infarction [facility]) 02/2015   Past Surgical History  Procedure Laterality Date  . Cardiac catheterization N/A 02/22/2015    Procedure: Left Heart Cath and Coronary Angiography;  Surgeon: Curtis Harp, MD;  Location: [place] [facility];  Service: Cardiovascular;  Laterality: N/A;  . Cardiac catheterization N/A 02/22/2015    Procedure: Coronary Stent Intervention;  Surgeon: Curtis Harp, MD;  Location: [place] [facility];  Service: Cardiovascular;  Laterality: N/A;  . Tee without cardioversion N/A 04/12/2015    Procedure: TRANSESOPHAGEAL ECHOCARDIOGRAM (TEE);  Surgeon: Curtis Dresser, MD;  Location: [place];   Service: Cardiovascular;  Laterality: N/A;  . Cardioversion N/A 04/12/2015    Procedure: CARDIOVERSION;  Surgeon: Curtis Dresser, MD;  Location: [facility] ENDOSCOPY;  Service: Cardiovascular;  Laterality: N/A;  . Electrophysiologic study N/A 05/30/2015    Atrial fibrillation ablation by Dr Curtis Flores  . Colonoscopy  07/01/2003    [id number] colonic mucosa except for the proximal right colon in the area of ICV which was not seen completely due to inadequate bowel prep. followed with ACBE which was normal.      Current Outpatient Prescriptions  Medication Sig Dispense Refill  . Apremilast (OTEZLA) 30 MG TABS Take 1 tablet by mouth 2 (two) times daily.    [name] atorvastatin (LIPITOR) 80 MG tablet Take 1 tablet (80 mg total) by mouth daily at 6 PM. 30 tablet 6  . carvedilol (COREG) 12.5 MG tablet Take 0.5 tablets (6.25 mg total) by mouth 2 (two) times daily. 60 tablet 3  . clopidogrel (PLAVIX) 75 MG tablet Take 1 tablet (75 mg total) by mouth daily. 30 tablet 6  . ferrous sulfate 325 (65 FE) MG tablet Take 325 mg by mouth 3 (three) times daily with meals.    . furosemide (LASIX) 40 MG tablet Take 1 tablet (40 mg total) by mouth 2 (two) times daily. 60 tablet 3  . leflunomide (ARAVA) 20 MG tablet Take 20 mg by mouth daily.     [name] losartan (COZAAR) 100 MG tablet Take 1 tablet (100 mg total) by mouth daily. 90 tablet 3  . pantoprazole (PROTONIX) 40 MG tablet Take 1 tablet (40 mg total) by mouth daily. 30 tablet  11   No current facility-administered medications for this visit.    Allergies:   Review of patient's allergies indicates no known allergies.   Social History:  The patient  reports that he quit smoking about 12 years ago. His smoking use included Cigarettes. He has a 30 pack-year smoking history. He has never used smokeless tobacco. He reports that he does not drink alcohol or use illicit drugs.   Family History:  The patient's  family history includes Hypertension in his mother. There is no history of  Colon cancer.    ROS:  Please see the history of present illness.   All other systems are reviewed and negative.    PHYSICAL EXAM: VS:  BP 142/56 mmHg  Pulse 56  Ht 5\' 7"  (1.702 m)  Wt 166 lb 12.8 oz (75.66 kg)  BMI 26.12 kg/m2 , BMI Body mass index is 26.12 kg/(m^2). GEN: Well nourished, well developed, in no acute distress HEENT: normal Neck: no JVD, carotid bruits, or masses Cardiac: RRR; no murmurs, rubs, or gallops,no edema  Respiratory:  clear to auscultation bilaterally, normal work of breathing GI: soft, nontender, nondistended, + BS MS: no deformity or atrophy Skin: warm and dry  Neuro:  Strength and sensation are intact Psych: euthymic mood, full affect  EKG:  EKG is ordered today. The ekg ordered today shows sinus rhythm, LBBB (QRS 148 msec)   Recent Labs: 02/22/2015: ALT 18 04/07/2015: B Natriuretic Peptide 339.0*; Magnesium 1.7 04/11/2015: TSH 1.188 05/18/2015: BUN 10; Creat 0.61*; Hemoglobin 11.6*; Platelets 305; Potassium 4.0; Sodium 138    Lipid Panel     Component Value Date/Time   CHOL 80 02/22/2015 2135   TRIG 61 02/22/2015 2135   HDL 20* 02/22/2015 2135   CHOLHDL 4.0 02/22/2015 2135   VLDL 12 02/22/2015 2135   LDLCALC 48 02/22/2015 2135     Wt Readings from Last 3 Encounters:  07/05/15 166 lb 12.8 oz (75.66 kg)  06/06/15 164 lb 3.2 oz (74.481 kg)  05/30/15 158 lb (71.668 kg)       ASSESSMENT AND PLAN:  1. Typical atrial Flutter Doing well s/p ablation Now off anticoagulation Given atrial enlargement, he is at risk for atrial fibrillation in the future Should he have atrial fibrillation, he will likely need to restart anticoagulation at that time. Stop amiodarone today  2. CAD Recent PCI No ischemic symptoms Continue plavix  3. Ischemic CM/ chronic systolic dysfunction/ LBBB Continue to optimize medicines as an outpatient Increase losartan to 100mg  daily today I have personally reviewed his echo images today.  EF 35-40%.  As his EF  is now >35%, he does not quality for ICD.  He should therefore follow-up with Dr [name] for further medicine titration going forward.    I will see as needed going forward.    Current medicines are reviewed at length with the patient today.   The patient does not have concerns regarding his medicines.  The following changes were made today:  none   Signed, Curtis Grayer, MD  07/05/2015 10:47 PM     [address] [place] [address] [phone] (office) [phone] (fax)

## 2015-07-05 NOTE — Telephone Encounter (Signed)
New Message:   Jerene Pitch called in about the pt's Losartan medication. She said there are 2 different strengths under this medication and she wanted to know which one needed to be refilled. Please f/u with her.

## 2015-07-10 DIAGNOSIS — D649 Anemia, unspecified: Secondary | ICD-10-CM | POA: Diagnosis not present

## 2015-07-12 DIAGNOSIS — K219 Gastro-esophageal reflux disease without esophagitis: Secondary | ICD-10-CM | POA: Diagnosis not present

## 2015-07-12 DIAGNOSIS — I2510 Atherosclerotic heart disease of native coronary artery without angina pectoris: Secondary | ICD-10-CM | POA: Diagnosis not present

## 2015-07-12 DIAGNOSIS — I10 Essential (primary) hypertension: Secondary | ICD-10-CM | POA: Diagnosis not present

## 2015-07-12 DIAGNOSIS — D649 Anemia, unspecified: Secondary | ICD-10-CM | POA: Diagnosis not present

## 2015-07-14 DIAGNOSIS — Z1211 Encounter for screening for malignant neoplasm of colon: Secondary | ICD-10-CM | POA: Diagnosis not present

## 2015-07-31 ENCOUNTER — Other Ambulatory Visit: Payer: Self-pay | Admitting: Physician Assistant

## 2015-08-01 ENCOUNTER — Encounter: Payer: Self-pay | Admitting: Gastroenterology

## 2015-08-01 ENCOUNTER — Ambulatory Visit (INDEPENDENT_AMBULATORY_CARE_PROVIDER_SITE_OTHER): Payer: Medicare Other | Admitting: Gastroenterology

## 2015-08-01 ENCOUNTER — Ambulatory Visit: Payer: Medicare Other | Admitting: Gastroenterology

## 2015-08-01 VITALS — BP 120/63 | HR 56 | Temp 97.2°F | Ht 67.0 in | Wt 156.8 lb

## 2015-08-01 DIAGNOSIS — R634 Abnormal weight loss: Secondary | ICD-10-CM | POA: Diagnosis not present

## 2015-08-01 DIAGNOSIS — D509 Iron deficiency anemia, unspecified: Secondary | ICD-10-CM | POA: Diagnosis not present

## 2015-08-01 DIAGNOSIS — K219 Gastro-esophageal reflux disease without esophagitis: Secondary | ICD-10-CM | POA: Diagnosis not present

## 2015-08-01 DIAGNOSIS — I2121 ST elevation (STEMI) myocardial infarction involving left circumflex coronary artery: Secondary | ICD-10-CM | POA: Diagnosis not present

## 2015-08-01 DIAGNOSIS — R195 Other fecal abnormalities: Secondary | ICD-10-CM | POA: Diagnosis not present

## 2015-08-01 NOTE — Assessment & Plan Note (Signed)
60 year old gentleman with past medical history significant for psoriasis on Otezla, rheumatoid arthritis on leflunomide, reactive leukocytosis, recent cardiac events as noted above who presents for IDA, heme positive stool. In addition he has had notable weight loss of 15 pounds since January, nearly 30 pounds last August 2016. Patient has been noted to have a decline in MCV around December of last year, appears to have chronic anemia with stable numbers. No overt GI bleeding, heme positive.  Offer patient colonoscopy with upper endoscopy for further evaluation of iron deficiency, heme positive stool, weight loss. Physically patient has improved but continues to have fatigue and dyspnea on exertion. We will contact cardiologist regarding proceeding with colonoscopy and upper endoscopy. Plan to continue Plavix. I feel like he should be able to undergo these procedures at this point and would not want to postpone too much longer given ongoing weight loss. We did discuss that weight loss potentially could be due to new medication targeted last fall, Otezla but given heme positive stool/IDA would consider other etiologies.

## 2015-08-01 NOTE — Progress Notes (Signed)
Primary Care Physician: [name], MD  Primary Gastroenterologist:  Garfield Cornea, MD   Chief Complaint  Patient presents with  . Follow-up    HPI: Curtis Flores is a 60 y.o. male with history of psoriasis on Otezla, rheumatoid arthritis on leflunomide, reactive leukocytosis, recent cardiac events as outlined below who presents for follow-up of heme positive stool and mild IDA. Last seen in early April, patient did not feel he can undergo colonoscopy with possible upper endoscopy at that time.  Background history: He had a ST elevation MI back in December, required PCI and DES. Started on DAPT with ASA and Brilinta initially. Patient underwent cardioversion back in February 2017 for atrial fib/flutter. Echocardiogram in February showed worsening ejection fraction of 20%. Switched from ASA and Brilinta to Plavix and Eliquis. Amiodarone started. On 05/30/15 he underwent EP study and radiofrquency ablation for A.flutter. Patient states he was advised to stop Eliquis after the ablation. Right now just on Plavix, no ASA.  Weight is down 5 pounds since January. Nearly 30 pounds since 10/2014. Patient states that his appetite is not as good as it used to be. Denies postprandial abdominal pain. Heartburn controlled on PPI. Little discomfort in his upper throat with meals, Burns but really no dysphagia. No vomiting. Stools are dark on iron. No rectal bleeding. States he was told that [place] could cause weight loss and he started on this last fall for his psoriasis.   Patient states he continues to have ongoing fatigue although somewhat improved. He ambulates around the house but never went to cardiac rehabilitation causes severe fatigue. Complains of shortness of breath with any exertion such as climbing stairs or walking on an incline.  Current Outpatient Prescriptions  Medication Sig Dispense Refill  . Apremilast (OTEZLA) 30 MG TABS Take 1 tablet by mouth 2 (two) times daily.      [name] atorvastatin (LIPITOR) 80 MG tablet Take 1 tablet (80 mg total) by mouth daily at 6 PM. 30 tablet 6  . carvedilol (COREG) 12.5 MG tablet Take 0.5 tablets (6.25 mg total) by mouth 2 (two) times daily. 60 tablet 3  . clopidogrel (PLAVIX) 75 MG tablet Take 1 tablet (75 mg total) by mouth daily. 30 tablet 6  . ferrous sulfate 325 (65 FE) MG tablet Take 325 mg by mouth 3 (three) times daily with meals.    . furosemide (LASIX) 40 MG tablet TAKE 1 TABLET BY MOUTH TWICE A DAY 60 tablet 9  . leflunomide (ARAVA) 20 MG tablet Take 20 mg by mouth daily.     [name] losartan (COZAAR) 100 MG tablet Take 1 tablet (100 mg total) by mouth daily. 90 tablet 3  . pantoprazole (PROTONIX) 40 MG tablet Take 1 tablet (40 mg total) by mouth daily. 30 tablet 11   No current facility-administered medications for this visit.    Allergies as of 08/01/2015  . (No Known Allergies)   Past Medical History  Diagnosis Date  . Psoriasis 2003  . Rheumatoid arthritis(714.0) 2012  . Psoriatic arthritis ([place])   . Leukocytosis     followed by hematology, reactive  . Lymphadenopathy   . Pulmonary fibrosis ([place])   . Hypertension   . GERD (gastroesophageal reflux disease)   . Hyperlipidemia   . Coronary artery disease     lateral STEMI 02/22/15  . Typical atrial flutter ([place])   . LBBB (left bundle branch block)   . Ischemic cardiomyopathy   . Chronic systolic dysfunction of left ventricle   .  Myocardial infarction [facility]) 02/2015   Past Surgical History  Procedure Laterality Date  . Cardiac catheterization N/A 02/22/2015    Procedure: Left Heart Cath and Coronary Angiography;  Surgeon: Lorretta Harp, MD;  Location: [place] [facility];  Service: Cardiovascular;  Laterality: N/A;  . Cardiac catheterization N/A 02/22/2015    Procedure: Coronary Stent Intervention;  Surgeon: Lorretta Harp, MD;  Location: [place] [facility];  Service: Cardiovascular;  Laterality: N/A;  . Tee without cardioversion N/A 04/12/2015     Procedure: TRANSESOPHAGEAL ECHOCARDIOGRAM (TEE);  Surgeon: Larey Dresser, MD;  Location: [place];  Service: Cardiovascular;  Laterality: N/A;  . Cardioversion N/A 04/12/2015    Procedure: CARDIOVERSION;  Surgeon: Larey Dresser, MD;  Location: [facility] ENDOSCOPY;  Service: Cardiovascular;  Laterality: N/A;  . Electrophysiologic study N/A 05/30/2015    Atrial fibrillation ablation by Dr Rayann Heman  . Colonoscopy  07/01/2003    [id number] colonic mucosa except for the proximal right colon in the area of ICV which was not seen completely due to inadequate bowel prep. followed with ACBE which was normal.    Family History  Problem Relation Age of Onset  . Hypertension Mother   . Colon cancer Neg Hx    Social History   Social History  . Marital Status: Married    Spouse Name: N/A  . Number of Children: 2  . Years of Education: N/A   Occupational History  . unemployed     not working do to arthritis; used to be Patent attorney for a [employer] History Main Topics  . Smoking status: Former Smoker -- 1.00 packs/day for 30 years    Types: Cigarettes    Quit date: 03/21/2003  . Smokeless tobacco: Never Used  . Alcohol Use: No     Comment: H/O case of beer weekly x 20 years, quiting in 2000-ish.  . Drug Use: No     Comment: H/O marijuana use many years ago.  [name] Sexual Activity: Yes    Birth Control/ Protection: None   Other Topics Concern  . None   Social History Narrative    ROS:  General: Negative for   fever, chills. See hpi ENT: Negative for hoarseness, difficulty swallowing , nasal congestion. CV: Negative for chest pain, angina, palpitations,  peripheral edema. +doe Respiratory: Negative for dyspnea at rest,   cough, sputum, wheezing. +doe GI: See history of present illness. GU:  Negative for dysuria, hematuria, urinary incontinence, urinary frequency, nocturnal urination.  Endo: see hpi   Physical Examination:   BP 120/63 mmHg  Pulse 56  Temp(Src) 97.2  F (36.2 C) (Oral)  Ht 5\' 7"  (1.702 m)  Wt 156 lb 12.8 oz (71.124 kg)  BMI 24.55 kg/m2  General: Well-nourished, well-developed in no acute distress. Accompanied by wife Eyes: No icterus. Mouth: Oropharyngeal mucosa moist and pink , no lesions erythema or exudate. Lungs: Clear to auscultation bilaterally.  Heart: Regular rate and rhythm, no murmurs rubs or gallops.  Abdomen: Bowel sounds are normal, nontender, nondistended, no hepatosplenomegaly or masses, no abdominal bruits or hernia , no rebound or guarding.   Extremities: No lower extremity edema. No clubbing or deformities. Neuro: Alert and oriented x 4   Skin: Warm and dry, no jaundice.   Psych: Alert and cooperative, normal mood and affect.  Labs:  Labs from 06/19/2015, hemoglobin 11.5, hematocrit 36, MCV 75, iron 31 low, iron saturation 17%, TIBC 178 low  Imaging Studies: No results found.

## 2015-08-01 NOTE — Progress Notes (Signed)
cc'ed to pcp °

## 2015-08-01 NOTE — Telephone Encounter (Signed)
Rx(s) sent to pharmacy electronically.  

## 2015-08-01 NOTE — Patient Instructions (Signed)
1. We will contact your cardiologist to see if they are okay with [place] proceeding with colonoscopy and upper endoscopy in the near future to evaluate weight loss, iron deficiency anemia, and blood in the stool.

## 2015-08-02 ENCOUNTER — Telehealth: Payer: Self-pay

## 2015-08-02 NOTE — Telephone Encounter (Signed)
Hello Dr.Allred,  Your patient, Curtis Flores was seen in our office by Neil Crouch PA-C for weight loss, IDA and heme + stool. We would like to perform a colonoscopy/EGD but the patient asked that we checked with you first. He wants to make sure you think its ok for him to undergo these procedures. Your input would be appreciated.   Thank you,  Almyra Free

## 2015-08-09 ENCOUNTER — Encounter: Payer: Self-pay | Admitting: Pulmonary Disease

## 2015-08-09 ENCOUNTER — Ambulatory Visit (INDEPENDENT_AMBULATORY_CARE_PROVIDER_SITE_OTHER): Payer: Medicare Other | Admitting: Pulmonary Disease

## 2015-08-09 VITALS — BP 100/58 | HR 59 | Ht 67.0 in | Wt 158.2 lb

## 2015-08-09 DIAGNOSIS — J8410 Pulmonary fibrosis, unspecified: Secondary | ICD-10-CM

## 2015-08-09 DIAGNOSIS — I2121 ST elevation (STEMI) myocardial infarction involving left circumflex coronary artery: Secondary | ICD-10-CM | POA: Diagnosis not present

## 2015-08-09 NOTE — Progress Notes (Signed)
   Subjective:    Patient ID: Curtis Flores, male    DOB: 1955/05/15, 60 y.o.   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  Seen by Dr Lamonte Sakai for persistent leucocytosis, bone marrow biopsy neg   08/09/2015  Chief Complaint  Patient presents with  . Follow-up    breathing is doing well, fatigued.  dry cough. chest tightness with deep breath.      On his last visit, lisinopril was stopped and changed to losartan He underwent ablation and amiodarone has been stopped His cough is much improved although it does bother him a little bit We reviewed his PFTs and his CT chest from 05/2015 which showed slight worsening His arthritis is controlled with occasional flares on arava and otezla His breathing is slightly worse on walking long distances He does not complain of significant heartburn  Significant tests/ events 2012 - presented with a symmetric polyarthritis of elbows, wrists, hands, neck, shoulders & feet  CXR at [facility] showed bibasal interstitial fibrosis with hilar lymphadenopathy With POS RA 89 & CCP 122, ANA neg, HLA B 27 neg, CK 87  PPD neg, hep panel neg  HRCT - extensive regions of ground-glass attenuation and most pronounced in the lower lobes of the lungs bilaterally. These areas demonstrate internal interstitial prominence with a large amount of microcystic honeycombingwith some cylindrical bronchiectasis and bronchiolectasis. moderate hiatal hernia was also noted.   PFTs 2013 showed intraprenchymal restricton with FVC around 71% & DLCO 56% c/w ILD, no airway obstruction.  Rpt PFTs 10/2012 show mild drop in FVC to 66%, DLCO preserved   PFT 05/2015 FEV1 70%, ratio 82, FVC 66%, no BD response, DLCO 46%. DLCO has decreased from 2014.  CT chest 05/12/15 showed ILD/NSIP , slight  progression compared from 2013.     Review of Systems neg for any significant sore throat, dysphagia, itching, sneezing, nasal congestion or excess/ purulent secretions, fever, chills, sweats, unintended wt loss, pleuritic or exertional cp, hempoptysis, orthopnea pnd or change in chronic leg swelling. Also denies presyncope, palpitations, heartburn, abdominal pain, nausea, vomiting, diarrhea or change in bowel or urinary habits, dysuria,hematuria, rash, arthralgias, visual complaints, headache, numbness weakness or ataxia.     Objective:   Physical Exam   Gen. Pleasant, well-nourished, in no distress ENT - no lesions, no post nasal drip Neck: No JVD, no thyromegaly, no carotid bruits Lungs: no use of accessory muscles, no dullness to percussion, bibasal rales , no rhonchi  Cardiovascular: Rhythm regular, heart sounds  normal, no murmurs or gallops, no peripheral edema Musculoskeletal: No deformities, no cyanosis or clubbing          Assessment & Plan:

## 2015-08-09 NOTE — Patient Instructions (Addendum)
Scarring in your lungs is slightly worse compared to 4 years ago We will continue to monitor your lung function every 6 months I will discuss options with Dr. Estanislado Pandy including prednisone Schedule PFTs next visit

## 2015-08-09 NOTE — Assessment & Plan Note (Addendum)
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 Estanislado Pandy including prednisone Schedule PFTs next visit  Addendum - discuss with rheumatology-they would prefer to use Imuran as steroid sparing agent-they will discuss with him on follow-up visit

## 2015-08-10 NOTE — Telephone Encounter (Signed)
SPOT ON HOLD FOR 08/30/15

## 2015-08-10 NOTE — Telephone Encounter (Signed)
Dr [name] is his primary cardiologist, I will forward to him.

## 2015-08-10 NOTE — Telephone Encounter (Signed)
Can we please follow up on this one?  Also, can we save a spot on schedule just prior to end of June for tcs/egd?

## 2015-08-10 NOTE — Telephone Encounter (Signed)
Routing to Dr. Jackalyn Lombard nurse- Claiborne Billings, for assistance.

## 2015-08-11 DIAGNOSIS — Z79899 Other long term (current) drug therapy: Secondary | ICD-10-CM | POA: Diagnosis not present

## 2015-08-14 NOTE — Telephone Encounter (Signed)
Thank you :)

## 2015-08-14 NOTE — Telephone Encounter (Signed)
Ginger or Candy, please schedule.

## 2015-08-14 NOTE — Telephone Encounter (Signed)
OK to do EGD and Colonoscopy but can't interrupt anti platelet Rx until Dec of this year  JJB

## 2015-08-15 ENCOUNTER — Other Ambulatory Visit: Payer: Self-pay

## 2015-08-15 DIAGNOSIS — K219 Gastro-esophageal reflux disease without esophagitis: Secondary | ICD-10-CM

## 2015-08-15 DIAGNOSIS — R195 Other fecal abnormalities: Secondary | ICD-10-CM

## 2015-08-15 DIAGNOSIS — D509 Iron deficiency anemia, unspecified: Secondary | ICD-10-CM

## 2015-08-15 DIAGNOSIS — R634 Abnormal weight loss: Secondary | ICD-10-CM

## 2015-08-15 MED ORDER — PEG 3350-KCL-NA BICARB-NACL 420 G PO SOLR: 4000.0000 mL | Freq: Once | ORAL | Status: DC

## 2015-08-15 NOTE — Telephone Encounter (Signed)
Correspondence noted.   Patient will need to hold iron for 7 days before TCS/EGD.

## 2015-08-15 NOTE — Telephone Encounter (Signed)
Called and [facility] for pt to hold Iron

## 2015-08-15 NOTE — Telephone Encounter (Signed)
Pt is arranged for TCS/EGD on 06/22. Wife is aware . Mailed instructions

## 2015-08-21 DIAGNOSIS — R7309 Other abnormal glucose: Secondary | ICD-10-CM | POA: Diagnosis not present

## 2015-08-21 DIAGNOSIS — R799 Abnormal finding of blood chemistry, unspecified: Secondary | ICD-10-CM | POA: Diagnosis not present

## 2015-08-21 DIAGNOSIS — E784 Other hyperlipidemia: Secondary | ICD-10-CM | POA: Diagnosis not present

## 2015-08-21 DIAGNOSIS — D649 Anemia, unspecified: Secondary | ICD-10-CM | POA: Diagnosis not present

## 2015-08-24 ENCOUNTER — Encounter (HOSPITAL_COMMUNITY): Payer: Self-pay | Admitting: *Deleted

## 2015-08-24 ENCOUNTER — Ambulatory Visit (HOSPITAL_COMMUNITY)
Admission: RE | Admit: 2015-08-24 | Discharge: 2015-08-24 | Disposition: A | Discharge status: Home / Self Care | Payer: Medicare Other | Source: Ambulatory Visit | Attending: Internal Medicine | Admitting: Internal Medicine

## 2015-08-24 ENCOUNTER — Encounter (HOSPITAL_COMMUNITY)
Admission: RE | Disposition: A | Discharge status: Home / Self Care | Payer: Self-pay | Source: Ambulatory Visit | Attending: Internal Medicine

## 2015-08-24 DIAGNOSIS — L409 Psoriasis, unspecified: Secondary | ICD-10-CM | POA: Diagnosis not present

## 2015-08-24 DIAGNOSIS — Z87891 Personal history of nicotine dependence: Secondary | ICD-10-CM | POA: Diagnosis not present

## 2015-08-24 DIAGNOSIS — I252 Old myocardial infarction: Secondary | ICD-10-CM | POA: Diagnosis not present

## 2015-08-24 DIAGNOSIS — I5022 Chronic systolic (congestive) heart failure: Secondary | ICD-10-CM | POA: Insufficient documentation

## 2015-08-24 DIAGNOSIS — I110 Hypertensive heart disease with heart failure: Secondary | ICD-10-CM | POA: Insufficient documentation

## 2015-08-24 DIAGNOSIS — K319 Disease of stomach and duodenum, unspecified: Secondary | ICD-10-CM | POA: Insufficient documentation

## 2015-08-24 DIAGNOSIS — I255 Ischemic cardiomyopathy: Secondary | ICD-10-CM | POA: Diagnosis not present

## 2015-08-24 DIAGNOSIS — Z7901 Long term (current) use of anticoagulants: Secondary | ICD-10-CM | POA: Insufficient documentation

## 2015-08-24 DIAGNOSIS — D509 Iron deficiency anemia, unspecified: Secondary | ICD-10-CM

## 2015-08-24 DIAGNOSIS — R634 Abnormal weight loss: Secondary | ICD-10-CM | POA: Insufficient documentation

## 2015-08-24 DIAGNOSIS — M069 Rheumatoid arthritis, unspecified: Secondary | ICD-10-CM | POA: Diagnosis not present

## 2015-08-24 DIAGNOSIS — I10 Essential (primary) hypertension: Secondary | ICD-10-CM | POA: Insufficient documentation

## 2015-08-24 DIAGNOSIS — R933 Abnormal findings on diagnostic imaging of other parts of digestive tract: Secondary | ICD-10-CM | POA: Diagnosis not present

## 2015-08-24 DIAGNOSIS — K219 Gastro-esophageal reflux disease without esophagitis: Secondary | ICD-10-CM | POA: Insufficient documentation

## 2015-08-24 DIAGNOSIS — K449 Diaphragmatic hernia without obstruction or gangrene: Secondary | ICD-10-CM

## 2015-08-24 DIAGNOSIS — R195 Other fecal abnormalities: Secondary | ICD-10-CM

## 2015-08-24 DIAGNOSIS — Z7902 Long term (current) use of antithrombotics/antiplatelets: Secondary | ICD-10-CM | POA: Diagnosis not present

## 2015-08-24 DIAGNOSIS — K2950 Unspecified chronic gastritis without bleeding: Secondary | ICD-10-CM | POA: Diagnosis not present

## 2015-08-24 HISTORY — PX: COLONOSCOPY: SHX5424

## 2015-08-24 HISTORY — PX: ESOPHAGOGASTRODUODENOSCOPY: SHX5428

## 2015-08-24 SURGERY — COLONOSCOPY
Anesthesia: Moderate Sedation

## 2015-08-24 MED ORDER — ONDANSETRON HCL 4 MG/2ML IJ SOLN
INTRAMUSCULAR | Status: AC
  Filled 2015-08-24: qty 2

## 2015-08-24 MED ORDER — MEPERIDINE HCL 100 MG/ML IJ SOLN
INTRAMUSCULAR | Status: DC | PRN
  Administered 2015-08-24 (×2): 25 mg via INTRAVENOUS
  Administered 2015-08-24: 50 mg via INTRAVENOUS

## 2015-08-24 MED ORDER — MEPERIDINE HCL 100 MG/ML IJ SOLN
INTRAMUSCULAR | Status: DC
Start: 2015-08-24 — End: 2015-08-24
  Filled 2015-08-24: qty 2

## 2015-08-24 MED ORDER — LIDOCAINE VISCOUS 2 % MT SOLN
OROMUCOSAL | Status: AC
  Filled 2015-08-24: qty 15

## 2015-08-24 MED ORDER — SIMETHICONE 40 MG/0.6ML PO SUSP
ORAL | Status: DC | PRN
  Administered 2015-08-24: 12:00:00

## 2015-08-24 MED ORDER — MIDAZOLAM HCL 5 MG/5ML IJ SOLN
INTRAMUSCULAR | Status: DC | PRN
  Administered 2015-08-24: 2 mg via INTRAVENOUS
  Administered 2015-08-24 (×2): 1 mg via INTRAVENOUS
  Administered 2015-08-24: 2 mg via INTRAVENOUS

## 2015-08-24 MED ORDER — SODIUM CHLORIDE 0.9 % IV SOLN
INTRAVENOUS | Status: DC
  Administered 2015-08-24: 11:00:00 via INTRAVENOUS

## 2015-08-24 MED ORDER — MIDAZOLAM HCL 5 MG/5ML IJ SOLN
INTRAMUSCULAR | Status: AC
  Filled 2015-08-24: qty 10

## 2015-08-24 MED ORDER — ONDANSETRON HCL 4 MG/2ML IJ SOLN
INTRAMUSCULAR | Status: DC | PRN
  Administered 2015-08-24: 4 mg via INTRAVENOUS

## 2015-08-24 NOTE — Op Note (Signed)
[facility] Patient Name: Curtis Flores Procedure Date: 08/24/2015 11:56 AM MRN: [id number] Date of Birth: 1956/01/01 Attending MD: Norvel Richards , MD CSN: [id number] Age: 60 Admit Type: Outpatient Procedure:                Upper GI endoscopy with esophageal and gastric                            biopsy Indications:              Iron deficiency anemia, Heartburn, Weight loss Providers:                Norvel Richards, MD, Otis Peak B. Gwenlyn Perking RN, RN,                            Randa Spike, Technician Referring MD:              Medicines:                Midazolam 5 mg IV, Meperidine 100 mg IV,                            Ondansetron 4 mg IV Complications:            No immediate complications. Estimated Blood Loss:     Estimated blood loss was minimal. Procedure:                Pre-Anesthesia Assessment:                           - Prior to the procedure, a History and Physical                            was performed, and patient medications and                            allergies were reviewed. The patient's tolerance of                            previous anesthesia was also reviewed. The risks                            and benefits of the procedure and the sedation                            options and risks were discussed with the patient.                            All questions were answered, and informed consent                            was obtained. Prior Anticoagulants: The patient                            last took Plavix (clopidogrel) on the day of the  procedure. ASA Grade Assessment: III - A patient                            with severe systemic disease. After reviewing the                            risks and benefits, the patient was deemed in                            satisfactory condition to undergo the procedure.                           After obtaining informed consent, the endoscope was   passed under direct vision. Throughout the                            procedure, the patient's blood pressure, pulse, and                            oxygen saturations were monitored continuously. The                            [id number] scope was introduced through the                            mouth, and advanced to the second part of duodenum.                            The upper GI endoscopy was accomplished without                            difficulty. The patient tolerated the procedure                            well. Scope In: 12:11:56 PM Scope Out: 12:20:12 PM Total Procedure Duration: 0 hours 8 minutes 16 seconds  Findings:      The esophagus and gastroesophageal junction were examined with white       light. There were esophageal mucosal changes suspicious for       short-segment Barrett's esophagus. These changes involved the mucosa       extending to the Z-line. Three tongues of salmon-colored mucosa were       present at 36 cm. This was biopsied with a cold forceps for histology.       Estimated blood loss was minimal.      A medium-sized hiatal hernia was present.      Multiple localized linear erosions were found in the stomach straddling       mucosa of the diaphragmatic hiatus. Otherwise, the gastric mucosa       appeared normal. Patent pylorus.. This was biopsied with a cold forceps       for histology. Estimated blood loss: none.      The bulb and second portion of the duodenum appeared normal. Impression:               - Esophageal mucosal changes suspicious for  short-segment Barrett's esophagus. Biopsied.                           - Medium-sized hiatal hernia.                           - Erosive gastropathy. Consistent with Lysbeth Galas                            lesions. Biopsied.                           - Normal first and second portion of the duodenum. Moderate Sedation:      Moderate (conscious) sedation was administered by  the endoscopy nurse       and supervised by the endoscopist. The following parameters were       monitored: oxygen saturation, heart rate, blood pressure, respiratory       rate, EKG, adequacy of pulmonary ventilation, and response to care.       Total physician intraservice time was 17 minutes. Recommendation:           - Patient has a contact number available for                            emergencies. The signs and symptoms of potential                            delayed complications were discussed with the                            patient. Return to normal activities tomorrow.                            Written discharge instructions were provided to the                            patient.                           - [Diet Recommendation] [Duration].                           - Patient has a contact number available for                            emergencies. The signs and symptoms of potential                            delayed complications were discussed with the                            patient. Return to normal activities tomorrow.                            Written discharge instructions were provided to the  patient.                           - Advance diet as tolerated.                           - Continue present medications.                           - Await pathology results.                           - No repeat upper endoscopy.                           - Return to GI office in 6 weeks. Procedure Code(s):        --- Professional ---                           [phone], Esophagogastroduodenoscopy, flexible,                            transoral; with biopsy, single or multiple                           99152, Moderate sedation services provided by the                            same physician or other qualified health care                            professional performing the diagnostic or                            therapeutic service that the  sedation supports,                            requiring the presence of an independent trained                            observer to assist in the monitoring of the                            patient's level of consciousness and physiological                            status; initial 15 minutes of intraservice time,                            patient age 12 years or older Diagnosis Code(s):        --- Professional ---                           K22.8, Other specified diseases of esophagus  K44.9, Diaphragmatic hernia without obstruction or                            gangrene                           K31.89, Other diseases of stomach and duodenum                           D50.9, Iron deficiency anemia, unspecified                           R12, Heartburn                           R63.4, Abnormal weight loss CPT copyright 2016 [employer]. All rights reserved. The codes documented in this report are preliminary and upon coder review may  be revised to meet current compliance requirements. Cristopher Estimable. Leiya Keesey, MD Norvel Richards, MD 08/24/2015 12:48:37 PM This report has been signed electronically. Number of Addenda: 0

## 2015-08-24 NOTE — Interval H&P Note (Signed)
History and Physical Interval Note:  08/24/2015 11:59 AM  Curtis Flores  has presented today for surgery, with the diagnosis of weight loss, iron deficiency anemia, GERD, heme postive stool  The various methods of treatment have been discussed with the patient and family. After consideration of risks, benefits and other options for treatment, the patient has consented to  Procedure(s) with comments: COLONOSCOPY (N/A) - 1030-moved to 1145 ESOPHAGOGASTRODUODENOSCOPY (EGD) (N/A) as a surgical intervention .  The patient's history has been reviewed, patient examined, no change in status, stable for surgery.  I have reviewed the patient's chart and labs.  Questions were answered to the patient's satisfaction.     [name]  No change. Diagnostic EGD and colonoscopy per plan.  The risks, benefits, limitations, imponderables and alternatives regarding both EGD and colonoscopy have been reviewed with the patient. Questions have been answered. All parties agreeable.

## 2015-08-24 NOTE — Discharge Instructions (Signed)
Colonoscopy Discharge Instructions  Read the instructions outlined below and refer to this sheet in the next few weeks. These discharge instructions provide you with general information on caring for yourself after you leave the hospital. Your doctor may also give you specific instructions. While your treatment has been planned according to the most current medical practices available, unavoidable complications occasionally occur. If you have any problems or questions after discharge, call Dr. Gala Romney at [phone]. ACTIVITY  You may resume your regular activity, but move at a slower pace for the next 24 hours.   Take frequent rest periods for the next 24 hours.   Walking will help get rid of the air and reduce the bloated feeling in your belly (abdomen).   No driving for 24 hours (because of the medicine (anesthesia) used during the test).    Do not sign any important legal documents or operate any machinery for 24 hours (because of the anesthesia used during the test).  NUTRITION  Drink plenty of fluids.   You may resume your normal diet as instructed by your doctor.   Begin with a light meal and progress to your normal diet. Heavy or fried foods are harder to digest and may make you feel sick to your stomach (nauseated).   Avoid alcoholic beverages for 24 hours or as instructed.  MEDICATIONS  You may resume your normal medications unless your doctor tells you otherwise.  WHAT YOU CAN EXPECT TODAY  Some feelings of bloating in the abdomen.   Passage of more gas than usual.   Spotting of blood in your stool or on the toilet paper.  IF YOU HAD POLYPS REMOVED DURING THE COLONOSCOPY:  No aspirin products for 7 days or as instructed.   No alcohol for 7 days or as instructed.   Eat a soft diet for the next 24 hours.  FINDING OUT THE RESULTS OF YOUR TEST Not all test results are available during your visit. If your test results are not back during the visit, make an appointment  with your caregiver to find out the results. Do not assume everything is normal if you have not heard from your caregiver or the medical facility. It is important for you to follow up on all of your test results.  SEEK IMMEDIATE MEDICAL ATTENTION IF:  You have more than a spotting of blood in your stool.   Your belly is swollen (abdominal distention).   You are nauseated or vomiting.   You have a temperature over 101.  You have abdominal pain or discomfort that is severe or gets worse throughout the day. EGD Discharge instructions Please read the instructions outlined below and refer to this sheet in the next few weeks. These discharge instructions provide you with general information on caring for yourself after you leave the hospital. Your doctor may also give you specific instructions. While your treatment has been planned according to the most current medical practices available, unavoidable complications occasionally occur. If you have any problems or questions after discharge, please call your doctor. ACTIVITY You may resume your regular activity but move at a slower pace for the next 24 hours.  Take frequent rest periods for the next 24 hours.  Walking will help expel (get rid of) the air and reduce the bloated feeling in your abdomen.  No driving for 24 hours (because of the anesthesia (medicine) used during the test).  You may shower.  Do not sign any important legal documents or operate any machinery for 24  hours (because of the anesthesia used during the test).  NUTRITION Drink plenty of fluids.  You may resume your normal diet.  Begin with a light meal and progress to your normal diet.  Avoid alcoholic beverages for 24 hours or as instructed by your caregiver.  MEDICATIONS You may resume your normal medications unless your caregiver tells you otherwise.  WHAT YOU CAN EXPECT TODAY You may experience abdominal discomfort such as a feeling of fullness or gas pains.   FOLLOW-UP Your doctor will discuss the results of your test with you.  SEEK IMMEDIATE MEDICAL ATTENTION IF ANY OF THE FOLLOWING OCCUR: Excessive nausea (feeling sick to your stomach) and/or vomiting.  Severe abdominal pain and distention (swelling).  Trouble swallowing.  Temperature over 101 F (37.8 C).  Rectal bleeding or vomiting of blood.     Repeat colonoscopy for screening purposes in 10 years.  Further recommendations to follow pending review of pathology report

## 2015-08-24 NOTE — Op Note (Signed)
[facility] Patient Name: Curtis Flores Procedure Date: 08/24/2015 12:21 PM MRN: [id number] Date of Birth: [birth year] Attending MD: Norvel Richards , MD CSN: [id number] Age: 60 Admit Type: Outpatient Procedure:                Ileo-colonoscopy - diagnostic Indications:              Heme positive stool, Iron deficiency anemia Providers:                Norvel Richards, MD, Gwenlyn Fudge RN, RN,                            Randa Spike, Technician Referring MD:              Medicines:                Midazolam 6 mg IV, Meperidine 100 mg IV,                            Ondansetron 4 mg IV Complications:            No immediate complications. Estimated Blood Loss:     Estimated blood loss: none. Procedure:                Pre-Anesthesia Assessment:                           - Prior to the procedure, a History and Physical                            was performed, and patient medications and                            allergies were reviewed. The patient's tolerance of                            previous anesthesia was also reviewed. The risks                            and benefits of the procedure and the sedation                            options and risks were discussed with the patient.                            All questions were answered, and informed consent                            was obtained. Prior Anticoagulants: The patient                            last took Plavix (clopidogrel) on the day of the                            procedure. ASA Grade Assessment: III - A patient  with severe systemic disease. After reviewing the                            risks and benefits, the patient was deemed in                            satisfactory condition to undergo the procedure.                           - Prior to the procedure, a History and Physical                            was performed, and patient medications and   allergies were reviewed. The patient's tolerance of                            previous anesthesia was also reviewed. The risks                            and benefits of the procedure and the sedation                            options and risks were discussed with the patient.                            All questions were answered, and informed consent                            was obtained. Prior Anticoagulants: The patient                            last took Plavix (clopidogrel) on the day of the                            procedure. ASA Grade Assessment: III - A patient                            with severe systemic disease. After reviewing the                            risks and benefits, the patient was deemed in                            satisfactory condition to undergo the procedure.                           After obtaining informed consent, the colonoscope                            was passed under direct vision. Throughout the                            procedure, the patient's blood pressure, pulse, and  oxygen saturations were monitored continuously. The                            [id number] scope was introduced through                            the anus and advanced to the 5 cm into the ileum.                            The colonoscopy was performed without difficulty.                            The patient tolerated the procedure well. The                            quality of the bowel preparation was adequate. The                            terminal ileum, ileocecal valve, appendiceal                            orifice, and rectum were photographed. Scope In: 12:26:57 PM Scope Out: 12:38:26 PM Scope Withdrawal Time: 0 hours 8 minutes 35 seconds  Total Procedure Duration: 0 hours 11 minutes 29 seconds  Findings:      The perianal and digital rectal examinations were normal.      The entire examined colon appeared normal on direct  and retroflexion       views. Estimated blood loss: none. The distal 5 cm of terminal ileal       mucosa also appeared normal. Impression:               - The entire examined colon is normal on direct and                            retroflexion views.                           - No specimens collected. Moderate Sedation:      Moderate (conscious) sedation was administered by the endoscopy nurse       and supervised by the endoscopist. The following parameters were       monitored: oxygen saturation, heart rate, blood pressure, respiratory       rate, EKG, adequacy of pulmonary ventilation, and response to care.       Total physician intraservice time was 35 minutes. Recommendation:           - Patient has a contact number available for                            emergencies. The signs and symptoms of potential                            delayed complications were discussed with the  patient. Return to normal activities tomorrow.                            Written discharge instructions were provided to the                            patient.                           - Advance diet as tolerated.                           - Continue present medications.                           - Repeat colonoscopy in 10 years for screening                            purposes.                           - Return to GI office in 8 weeks. See EGD report Procedure Code(s):        --- Professional ---                           [phone], Colonoscopy, flexible; diagnostic, including                            collection of specimen(s) by brushing or washing,                            when performed (separate procedure)                           99152, Moderate sedation services provided by the                            same physician or other qualified health care                            professional performing the diagnostic or                            therapeutic service that  the sedation supports,                            requiring the presence of an independent trained                            observer to assist in the monitoring of the                            patient's level of consciousness and physiological                            status; initial  15 minutes of intraservice time,                            patient age 57 years or older                           [id number], Moderate sedation services; each additional                            15 minutes intraservice time Diagnosis Code(s):        --- Professional ---                           R19.5, Other fecal abnormalities                           D50.9, Iron deficiency anemia, unspecified CPT copyright 2016 [employer]. All rights reserved. The codes documented in this report are preliminary and upon coder review may  be revised to meet current compliance requirements. Cristopher Estimable. Astria Jordahl, MD Norvel Richards, MD 08/24/2015 12:53:47 PM This report has been signed electronically. Number of Addenda: 0

## 2015-08-24 NOTE — H&P (View-Only) (Signed)
Primary Care Physician: [name], MD  Primary Gastroenterologist:  Garfield Cornea, MD   Chief Complaint  Patient presents with  . Follow-up    HPI: Curtis Flores is a 60 y.o. male with history of psoriasis on Otezla, rheumatoid arthritis on leflunomide, reactive leukocytosis, recent cardiac events as outlined below who presents for follow-up of heme positive stool and mild IDA. Last seen in early April, patient did not feel he can undergo colonoscopy with possible upper endoscopy at that time.  Background history: He had a ST elevation MI back in December, required PCI and DES. Started on DAPT with ASA and Brilinta initially. Patient underwent cardioversion back in February 2017 for atrial fib/flutter. Echocardiogram in February showed worsening ejection fraction of 20%. Switched from ASA and Brilinta to Plavix and Eliquis. Amiodarone started. On 05/30/15 he underwent EP study and radiofrquency ablation for A.flutter. Patient states he was advised to stop Eliquis after the ablation. Right now just on Plavix, no ASA.  Weight is down 5 pounds since January. Nearly 30 pounds since 10/2014. Patient states that his appetite is not as good as it used to be. Denies postprandial abdominal pain. Heartburn controlled on PPI. Little discomfort in his upper throat with meals, Burns but really no dysphagia. No vomiting. Stools are dark on iron. No rectal bleeding. States he was told that [place] could cause weight loss and he started on this last fall for his psoriasis.   Patient states he continues to have ongoing fatigue although somewhat improved. He ambulates around the house but never went to cardiac rehabilitation causes severe fatigue. Complains of shortness of breath with any exertion such as climbing stairs or walking on an incline.  Current Outpatient Prescriptions  Medication Sig Dispense Refill  . Apremilast (OTEZLA) 30 MG TABS Take 1 tablet by mouth 2 (two) times daily.      [name] atorvastatin (LIPITOR) 80 MG tablet Take 1 tablet (80 mg total) by mouth daily at 6 PM. 30 tablet 6  . carvedilol (COREG) 12.5 MG tablet Take 0.5 tablets (6.25 mg total) by mouth 2 (two) times daily. 60 tablet 3  . clopidogrel (PLAVIX) 75 MG tablet Take 1 tablet (75 mg total) by mouth daily. 30 tablet 6  . ferrous sulfate 325 (65 FE) MG tablet Take 325 mg by mouth 3 (three) times daily with meals.    . furosemide (LASIX) 40 MG tablet TAKE 1 TABLET BY MOUTH TWICE A DAY 60 tablet 9  . leflunomide (ARAVA) 20 MG tablet Take 20 mg by mouth daily.     [name] losartan (COZAAR) 100 MG tablet Take 1 tablet (100 mg total) by mouth daily. 90 tablet 3  . pantoprazole (PROTONIX) 40 MG tablet Take 1 tablet (40 mg total) by mouth daily. 30 tablet 11   No current facility-administered medications for this visit.    Allergies as of 08/01/2015  . (No Known Allergies)   Past Medical History  Diagnosis Date  . Psoriasis 2003  . Rheumatoid arthritis(714.0) 2012  . Psoriatic arthritis ([place])   . Leukocytosis     followed by hematology, reactive  . Lymphadenopathy   . Pulmonary fibrosis ([place])   . Hypertension   . GERD (gastroesophageal reflux disease)   . Hyperlipidemia   . Coronary artery disease     lateral STEMI 02/22/15  . Typical atrial flutter ([place])   . LBBB (left bundle branch block)   . Ischemic cardiomyopathy   . Chronic systolic dysfunction of left ventricle   .  Myocardial infarction [facility]) 02/2015   Past Surgical History  Procedure Laterality Date  . Cardiac catheterization N/A 02/22/2015    Procedure: Left Heart Cath and Coronary Angiography;  Surgeon: Lorretta Harp, MD;  Location: [place] CV [facility];  Service: Cardiovascular;  Laterality: N/A;  . Cardiac catheterization N/A 02/22/2015    Procedure: Coronary Stent Intervention;  Surgeon: Lorretta Harp, MD;  Location: [place] [facility];  Service: Cardiovascular;  Laterality: N/A;  . Tee without cardioversion N/A 04/12/2015     Procedure: TRANSESOPHAGEAL ECHOCARDIOGRAM (TEE);  Surgeon: Larey Dresser, MD;  Location: [place];  Service: Cardiovascular;  Laterality: N/A;  . Cardioversion N/A 04/12/2015    Procedure: CARDIOVERSION;  Surgeon: Larey Dresser, MD;  Location: [facility] ENDOSCOPY;  Service: Cardiovascular;  Laterality: N/A;  . Electrophysiologic study N/A 05/30/2015    Atrial fibrillation ablation by Dr Rayann Heman  . Colonoscopy  07/01/2003    [id number] colonic mucosa except for the proximal right colon in the area of ICV which was not seen completely due to inadequate bowel prep. followed with ACBE which was normal.    Family History  Problem Relation Age of Onset  . Hypertension Mother   . Colon cancer Neg Hx    Social History   Social History  . Marital Status: Married    Spouse Name: N/A  . Number of Children: 2  . Years of Education: N/A   Occupational History  . unemployed     not working do to arthritis; used to be Patent attorney for a [employer] History Main Topics  . Smoking status: Former Smoker -- 1.00 packs/day for 30 years    Types: Cigarettes    Quit date: 03/21/2003  . Smokeless tobacco: Never Used  . Alcohol Use: No     Comment: H/O case of beer weekly x 20 years, quiting in 2000-ish.  . Drug Use: No     Comment: H/O marijuana use many years ago.  [name] Sexual Activity: Yes    Birth Control/ Protection: None   Other Topics Concern  . None   Social History Narrative    ROS:  General: Negative for   fever, chills. See hpi ENT: Negative for hoarseness, difficulty swallowing , nasal congestion. CV: Negative for chest pain, angina, palpitations,  peripheral edema. +doe Respiratory: Negative for dyspnea at rest,   cough, sputum, wheezing. +doe GI: See history of present illness. GU:  Negative for dysuria, hematuria, urinary incontinence, urinary frequency, nocturnal urination.  Endo: see hpi   Physical Examination:   BP 120/63 mmHg  Pulse 56  Temp(Src) 97.2  F (36.2 C) (Oral)  Ht 5\' 7"  (1.702 m)  Wt 156 lb 12.8 oz (71.124 kg)  BMI 24.55 kg/m2  General: Well-nourished, well-developed in no acute distress. Accompanied by wife Eyes: No icterus. Mouth: Oropharyngeal mucosa moist and pink , no lesions erythema or exudate. Lungs: Clear to auscultation bilaterally.  Heart: Regular rate and rhythm, no murmurs rubs or gallops.  Abdomen: Bowel sounds are normal, nontender, nondistended, no hepatosplenomegaly or masses, no abdominal bruits or hernia , no rebound or guarding.   Extremities: No lower extremity edema. No clubbing or deformities. Neuro: Alert and oriented x 4   Skin: Warm and dry, no jaundice.   Psych: Alert and cooperative, normal mood and affect.  Labs:  Labs from 06/19/2015, hemoglobin 11.5, hematocrit 36, MCV 75, iron 31 low, iron saturation 17%, TIBC 178 low  Imaging Studies: No results found.

## 2015-08-29 ENCOUNTER — Encounter: Payer: Self-pay | Admitting: Cardiology

## 2015-08-29 ENCOUNTER — Ambulatory Visit (INDEPENDENT_AMBULATORY_CARE_PROVIDER_SITE_OTHER): Payer: Medicare Other | Admitting: Cardiology

## 2015-08-29 VITALS — BP 124/60 | HR 61 | Ht 67.0 in | Wt 158.2 lb

## 2015-08-29 DIAGNOSIS — I429 Cardiomyopathy, unspecified: Secondary | ICD-10-CM | POA: Diagnosis not present

## 2015-08-29 DIAGNOSIS — I2510 Atherosclerotic heart disease of native coronary artery without angina pectoris: Secondary | ICD-10-CM | POA: Diagnosis not present

## 2015-08-29 DIAGNOSIS — M0579 Rheumatoid arthritis with rheumatoid factor of multiple sites without organ or systems involvement: Secondary | ICD-10-CM

## 2015-08-29 DIAGNOSIS — Z9861 Coronary angioplasty status: Secondary | ICD-10-CM | POA: Diagnosis not present

## 2015-08-29 DIAGNOSIS — I480 Paroxysmal atrial fibrillation: Secondary | ICD-10-CM | POA: Diagnosis not present

## 2015-08-29 DIAGNOSIS — Z79899 Other long term (current) drug therapy: Secondary | ICD-10-CM | POA: Diagnosis not present

## 2015-08-29 DIAGNOSIS — I2121 ST elevation (STEMI) myocardial infarction involving left circumflex coronary artery: Secondary | ICD-10-CM

## 2015-08-29 DIAGNOSIS — I10 Essential (primary) hypertension: Secondary | ICD-10-CM | POA: Insufficient documentation

## 2015-08-29 DIAGNOSIS — I428 Other cardiomyopathies: Secondary | ICD-10-CM | POA: Insufficient documentation

## 2015-08-29 LAB — BASIC METABOLIC PANEL
BUN: 12 mg/dL (ref 7–25)
CO2: 29 mmol/L (ref 20–31)
Calcium: 9.7 mg/dL (ref 8.6–10.3)
Chloride: 100 mmol/L (ref 98–110)
Creat: 0.61 mg/dL — ABNORMAL LOW (ref 0.70–1.25)
Glucose, Bld: 76 mg/dL (ref 65–99)
Potassium: 4.4 mmol/L (ref 3.5–5.3)
Sodium: 139 mmol/L (ref 135–146)

## 2015-08-29 MED ORDER — FUROSEMIDE 40 MG PO TABS: 40.0000 mg | ORAL_TABLET | Freq: Every day | ORAL | Status: DC

## 2015-08-29 NOTE — Assessment & Plan Note (Signed)
EF improved to 35-40% by echo May 2017 after RFA

## 2015-08-29 NOTE — Patient Instructions (Signed)
Medication Instructions: Kerin Ransom, PA-C, has recommended making the following medication changes: 1. DECREASE Furosemide (Lasix) to 40 mg once daily  Labwork: Your physician recommends that you return for lab work TODAY.  Testing/Procedures: NONE ORDERED  Follow-up: Lurena Joiner recommends that you schedule a follow-up appointment in 6 months with Dr [name]. You will receive a reminder letter in the mail two months in advance. If you don't receive a letter, please call our office to schedule the follow-up appointment.  If you need a refill on your cardiac medications before your next appointment, please call your pharmacy.

## 2015-08-29 NOTE — Progress Notes (Signed)
08/29/2015 Curtis Flores   September 10, 1955  [id number]  Primary Physician Curtis Mustache, MD Primary Cardiologist: Dr Curtis Flores  HPI:  60 year old black male had an acute lateral wall infarction on February 22 2015. He had three-vessel disease and underwent DES to the circumflex and first marginal branch by Dr. Gwenlyn Flores. He had residual disease in the LAD and RCA.  His ejection fraction was around 35-40% on discharge. He presented to the ED in Feb with AF (flutter) with RVR and underwent TEE CV and was discharged on Amiodarone and anticoagulation. He does have interstitial lung disease and it was decided to recommend RFA so we could stop his Amiodarone. The pt had a Myoview 05/24/15 that showed no ischemia. He underwent RFA 05/30/15. Echo in May showed some improvement in his EF to 35-40%. He is here for routine follow up. He denies any tachycardia or unusual dyspnea.. He has seen Dr Curtis Flores and Dr Curtis Flores. Dr Curtis Flores follows his rheumatoid arthritis.    Current Outpatient Prescriptions  Medication Sig Dispense Refill  . Apremilast (OTEZLA) 30 MG TABS Take 1 tablet by mouth 2 (two) times daily.    [name] atorvastatin (LIPITOR) 80 MG tablet Take 1 tablet (80 mg total) by mouth daily at 6 PM. 30 tablet 6  . carvedilol (COREG) 12.5 MG tablet Take 0.5 tablets (6.25 mg total) by mouth 2 (two) times daily. (Patient taking differently: Take 12.5 mg by mouth daily. ) 60 tablet 3  . clopidogrel (PLAVIX) 75 MG tablet Take 1 tablet (75 mg total) by mouth daily. 30 tablet 6  . ferrous sulfate 325 (65 FE) MG tablet Take 325 mg by mouth 3 (three) times daily with meals.    . furosemide (LASIX) 40 MG tablet Take 1 tablet (40 mg total) by mouth daily. 30 tablet 11  . losartan (COZAAR) 100 MG tablet Take 1 tablet (100 mg total) by mouth daily. 90 tablet 3  . pantoprazole (PROTONIX) 40 MG tablet Take 1 tablet (40 mg total) by mouth daily. 30 tablet 11  . polyethylene glycol-electrolytes (NULYTELY/GOLYTELY) 420 g solution  Take 4,000 mLs by mouth once. 4000 mL 0   No current facility-administered medications for this visit.    No Known Allergies  Social History   Social History  . Marital Status: Married    Spouse Name: N/A  . Number of Children: 2  . Years of Education: N/A   Occupational History  . 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 Smoking status: Former Smoker -- 1.00 packs/day for 30 years    Types: Cigarettes    Quit date: 03/21/2003  . Smokeless tobacco: Never Used  . Alcohol Use: No     Comment: H/O case of beer weekly x 20 years, quiting in 2000-ish.  . Drug Use: No     Comment: H/O marijuana use many years ago.  [name] Sexual Activity: Yes    Birth Control/ Protection: None   Other Topics Concern  . Not on file   Social History Narrative     Review of Systems: General: negative for chills, fever, night sweats or weight changes.  Cardiovascular: negative for chest pain, dyspnea on exertion, edema, orthopnea, palpitations, paroxysmal nocturnal dyspnea or shortness of breath Dermatological: negative for rash Respiratory: negative for cough or wheezing Urologic: negative for hematuria Abdominal: negative for nausea, vomiting, diarrhea, bright red blood per rectum, melena, or hematemesis  Neurologic: negative for visual changes, syncope, or dizziness All other systems reviewed and are otherwise negative except as noted above.    Blood pressure 124/60, pulse 61, height 5\' 7"  (1.702 m), weight 158 lb 3.2 oz (71.759 kg).  General appearance: alert, cooperative and no distress Neck: no carotid bruit and no JVD Lungs: diffuse velcro crackles Heart: regular rate and rhythm Extremities: no edema Skin: Skin color, texture, turgor normal. No rashes or lesions Neurologic: Grossly normal  EKG NSR, LBBB  ASSESSMENT AND PLAN:   PAF (paroxysmal atrial fibrillation) ([place]) TEE CV Feb 2017-  recurrent AF- RFA 05/30/15, also taken off Amiodarone (ILD) and anticoagulation (Fe anemia).  CAD S/P percutaneous coronary angioplasty CFX MI with OM PCI DES 12/16. Residual Ca++ LAD and RCA disease, Myoview no ischemia 05/24/15. No angina complaints.  NICM (nonischemic cardiomyopathy) ([place]) EF improved to 35-40% by echo May 2017 after RFA  Pulmonary fibrosis ([place]) Followed by Dr Curtis Flores, Amiodarone stopped in March 2017  Rheumatoid arthritis ([place]) On Otezla, followed by Dr Curtis Flores  Essential hypertension Controlled   PLAN  I cut [name]'s Lasix back to 40 mg daily. F/U Dr Curtis Flores 6 months, BMP today.   Curtis Ransom PA-C 08/29/2015 10:36 AM

## 2015-08-29 NOTE — Assessment & Plan Note (Signed)
TEE CV Feb 2017- recurrent AF- RFA 05/30/15, also taken off Amiodarone (ILD) and anticoagulation (Fe anemia).

## 2015-08-29 NOTE — Assessment & Plan Note (Signed)
On Otezla, followed by Dr Patrecia Pour

## 2015-08-29 NOTE — Assessment & Plan Note (Signed)
CFX MI with OM PCI DES 12/16. Residual Ca++ LAD and RCA disease, Myoview no ischemia 05/24/15. No angina complaints.

## 2015-08-29 NOTE — Assessment & Plan Note (Signed)
Followed by Dr Elsworth Soho, Amiodarone stopped in March 2017

## 2015-08-29 NOTE — Assessment & Plan Note (Signed)
Controlled.  

## 2015-08-30 ENCOUNTER — Encounter (HOSPITAL_COMMUNITY): Payer: Self-pay | Admitting: Internal Medicine

## 2015-09-01 ENCOUNTER — Encounter: Payer: Self-pay | Admitting: Internal Medicine

## 2015-09-06 DIAGNOSIS — I2510 Atherosclerotic heart disease of native coronary artery without angina pectoris: Secondary | ICD-10-CM | POA: Diagnosis not present

## 2015-09-06 DIAGNOSIS — E785 Hyperlipidemia, unspecified: Secondary | ICD-10-CM | POA: Diagnosis not present

## 2015-09-06 DIAGNOSIS — K219 Gastro-esophageal reflux disease without esophagitis: Secondary | ICD-10-CM | POA: Diagnosis not present

## 2015-09-06 DIAGNOSIS — I10 Essential (primary) hypertension: Secondary | ICD-10-CM | POA: Diagnosis not present

## 2015-09-06 DIAGNOSIS — D649 Anemia, unspecified: Secondary | ICD-10-CM | POA: Diagnosis not present

## 2015-09-11 ENCOUNTER — Telehealth: Payer: Self-pay

## 2015-09-11 ENCOUNTER — Encounter: Payer: Self-pay | Admitting: Internal Medicine

## 2015-09-11 NOTE — Telephone Encounter (Signed)
Letter mailed to the pt. 

## 2015-09-11 NOTE — Telephone Encounter (Signed)
Per RMR- Send letter to patient.  Send copy of letter with path to referring provider and PCP.   Ov w extender in about 6 weeks to recheck weight, re-assess to see if further w/u of wt loss/ anemia needed

## 2015-09-11 NOTE — Telephone Encounter (Signed)
APPT MADE AND LETTER SENT  °

## 2015-09-12 DIAGNOSIS — Z1211 Encounter for screening for malignant neoplasm of colon: Secondary | ICD-10-CM | POA: Diagnosis not present

## 2015-09-20 DIAGNOSIS — D649 Anemia, unspecified: Secondary | ICD-10-CM | POA: Diagnosis not present

## 2015-09-26 DIAGNOSIS — Z09 Encounter for follow-up examination after completed treatment for conditions other than malignant neoplasm: Secondary | ICD-10-CM | POA: Diagnosis not present

## 2015-09-26 DIAGNOSIS — Z9225 Personal history of immunosupression therapy: Secondary | ICD-10-CM | POA: Diagnosis not present

## 2015-09-26 DIAGNOSIS — Z79899 Other long term (current) drug therapy: Secondary | ICD-10-CM | POA: Diagnosis not present

## 2015-09-26 DIAGNOSIS — M79641 Pain in right hand: Secondary | ICD-10-CM | POA: Diagnosis not present

## 2015-09-26 DIAGNOSIS — L408 Other psoriasis: Secondary | ICD-10-CM | POA: Diagnosis not present

## 2015-09-26 DIAGNOSIS — M25521 Pain in right elbow: Secondary | ICD-10-CM | POA: Diagnosis not present

## 2015-10-03 DIAGNOSIS — L408 Other psoriasis: Secondary | ICD-10-CM | POA: Diagnosis not present

## 2015-10-03 DIAGNOSIS — M79641 Pain in right hand: Secondary | ICD-10-CM | POA: Diagnosis not present

## 2015-10-03 DIAGNOSIS — Z09 Encounter for follow-up examination after completed treatment for conditions other than malignant neoplasm: Secondary | ICD-10-CM | POA: Diagnosis not present

## 2015-10-03 DIAGNOSIS — M79671 Pain in right foot: Secondary | ICD-10-CM | POA: Diagnosis not present

## 2015-10-05 ENCOUNTER — Other Ambulatory Visit: Payer: Self-pay | Admitting: Cardiovascular Disease

## 2015-10-06 NOTE — Telephone Encounter (Signed)
Rx(s) sent to pharmacy electronically.  

## 2015-10-09 ENCOUNTER — Other Ambulatory Visit: Payer: Self-pay | Admitting: Cardiovascular Disease

## 2015-10-23 DIAGNOSIS — Z79899 Other long term (current) drug therapy: Secondary | ICD-10-CM | POA: Diagnosis not present

## 2015-10-23 DIAGNOSIS — M2550 Pain in unspecified joint: Secondary | ICD-10-CM | POA: Diagnosis not present

## 2015-10-26 ENCOUNTER — Ambulatory Visit (INDEPENDENT_AMBULATORY_CARE_PROVIDER_SITE_OTHER): Payer: Medicare Other | Admitting: Gastroenterology

## 2015-10-26 ENCOUNTER — Encounter: Payer: Self-pay | Admitting: Gastroenterology

## 2015-10-26 VITALS — BP 129/57 | HR 55 | Temp 97.9°F | Ht 67.0 in | Wt 161.0 lb

## 2015-10-26 DIAGNOSIS — R634 Abnormal weight loss: Secondary | ICD-10-CM

## 2015-10-26 DIAGNOSIS — K219 Gastro-esophageal reflux disease without esophagitis: Secondary | ICD-10-CM | POA: Diagnosis not present

## 2015-10-26 DIAGNOSIS — D509 Iron deficiency anemia, unspecified: Secondary | ICD-10-CM | POA: Diagnosis not present

## 2015-10-26 DIAGNOSIS — I2121 ST elevation (STEMI) myocardial infarction involving left circumflex coronary artery: Secondary | ICD-10-CM | POA: Diagnosis not present

## 2015-10-26 MED ORDER — PANTOPRAZOLE SODIUM 40 MG PO TBEC: 40.0000 mg | DELAYED_RELEASE_TABLET | Freq: Two times a day (BID) | ORAL | 3 refills | Status: DC

## 2015-10-26 NOTE — Assessment & Plan Note (Signed)
Clinically patient is doing much better. EGD showed medium-sized hiatal hernia with erosive gastropathy/Cameron lesions. Colonoscopy unremarkable. We will recheck his hemoglobin and iron next month when he sees his PCP. Currently he takes iron once daily.  Typical heartburn is well controlled but he's having some other symptoms suggestive of reflux. Constant throat clearing, especially in the mornings and after meals. Increase pantoprazole to 40 mg twice daily before meals. He will call in one month with a progress report.  No evidence of further weight loss, patient will continue to keep check on his weight and if any further significant weight loss, he'll let us know.

## 2015-10-26 NOTE — Patient Instructions (Signed)
1. Please have your labs done next month when he see Dr. Edrick Oh. Please take our orders with you. 2. Increase pantoprazole to 30 minutes before breakfast and 30 minutes before your evening meal. 3. Call in one month and let me know how you're reflux is doing, specifically throat clearing/throat congestion. 4. Keep an eye on your weight, if you continue to lose weight, please let someone know.

## 2015-10-26 NOTE — Progress Notes (Signed)
Primary Care Physician: [name], MD  Primary Gastroenterologist:  Garfield Cornea, MD   Chief Complaint  Patient presents with  . Follow-up    HPI: Curtis Flores is a 60 y.o. male here for follow-up. He was seen in May of this year for further evaluation of heme positive stool, mild IDA, weight loss. He had noted 30 pound weight loss since fall of 2016. Complained of poor appetite. He had been started on [place] last fall for psoriasis and was told this could cause weight loss.Underwent colonoscopy and EGD as outlined below.  Patient states he feels well. Appetite has improved. Eats anything he wants to eat. No heartburn. No dysphagia. Bowel movements regular. No blood in the stool or melena. Weight has been stable since February. Only concern is that of congestion in his throat, worse in the morning, worse after meals. Feels like he has to clear his throat, sometimes mucus comes up.  Past Surgical History:  Procedure Laterality Date  . COLONOSCOPY N/A 08/24/2015   Dr. Gala Romney: Normal colon. Next colonoscopy in 10 years.  . ESOPHAGOGASTRODUODENOSCOPY N/A 08/24/2015   Dr. Gala Romney: Medium-sized hiatal hernia, erosive gastropathy. Cameron lesions. Esophageal mucosa distally suggestive of short segment Barrett's esophagus. Not confirmed on biopsy. Gastric biopsy with minimal chronic inflammation     Current Outpatient Prescriptions  Medication Sig Dispense Refill  . Apremilast (OTEZLA) 30 MG TABS Take 1 tablet by mouth 2 (two) times daily.    . carvedilol (COREG) 12.5 MG tablet TAKE 1 AND 1/2 TABLETS BY MOUTH TWICE DAILY 60 tablet 8  . clopidogrel (PLAVIX) 75 MG tablet Take 1 tablet (75 mg total) by mouth daily. 30 tablet 6  . ferrous sulfate 325 (65 FE) MG tablet Take 325 mg by mouth daily with breakfast.     . furosemide (LASIX) 40 MG tablet Take 1 tablet (40 mg total) by mouth daily. 30 tablet 11  . losartan (COZAAR) 100 MG tablet Take 1 tablet (100 mg total) by mouth  daily. 90 tablet 3  . pantoprazole (PROTONIX) 40 MG tablet Take 1 tablet (40 mg total) by mouth 2 (two) times daily before a meal. 180 tablet 3   No current facility-administered medications for this visit.     Allergies as of 10/26/2015  . (No Known Allergies)    ROS:  General: Negative for anorexia, weight loss, fever, chills, fatigue, weakness. ENT: Negative for hoarseness, difficulty swallowing , nasal congestion. CV: Negative for chest pain, angina, palpitations, dyspnea on exertion, peripheral edema.  Respiratory: Negative for dyspnea at rest, dyspnea on exertion, cough, sputum, wheezing.  GI: See history of present illness. GU:  Negative for dysuria, hematuria, urinary incontinence, urinary frequency, nocturnal urination.  Endo: Negative for unusual weight change.    Physical Examination:   BP (!) 129/57   Pulse (!) 55   Temp 97.9 F (36.6 C) (Oral)   Ht 5\' 7"  (1.702 m)   Wt 161 lb (73 kg)   BMI 25.22 kg/m   General: Well-nourished, well-developed in no acute distress.  Eyes: No icterus. Mouth: Oropharyngeal mucosa moist and pink , no lesions erythema or exudate. Lungs: Clear to auscultation bilaterally.  Heart: Regular rate and rhythm, no murmurs rubs or gallops.  Abdomen: Bowel sounds are normal, nontender, nondistended, no hepatosplenomegaly or masses, no abdominal bruits or hernia , no rebound or guarding.   Extremities: No lower extremity edema. No clubbing or deformities. Neuro: Alert and oriented x 4   Skin: Warm and dry,  no jaundice.   Psych: Alert and cooperative, normal mood and affect.

## 2015-10-27 NOTE — Progress Notes (Signed)
cc'ed to pcp °

## 2015-11-10 ENCOUNTER — Other Ambulatory Visit: Payer: Self-pay | Admitting: Physician Assistant

## 2015-11-14 ENCOUNTER — Other Ambulatory Visit: Payer: Self-pay | Admitting: Physician Assistant

## 2015-11-15 DIAGNOSIS — Z79899 Other long term (current) drug therapy: Secondary | ICD-10-CM | POA: Diagnosis not present

## 2015-12-13 ENCOUNTER — Ambulatory Visit (INDEPENDENT_AMBULATORY_CARE_PROVIDER_SITE_OTHER): Payer: Medicare Other | Admitting: Pulmonary Disease

## 2015-12-13 ENCOUNTER — Encounter: Payer: Self-pay | Admitting: Pulmonary Disease

## 2015-12-13 DIAGNOSIS — J8410 Pulmonary fibrosis, unspecified: Secondary | ICD-10-CM

## 2015-12-13 DIAGNOSIS — I2121 ST elevation (STEMI) myocardial infarction involving left circumflex coronary artery: Secondary | ICD-10-CM

## 2015-12-13 DIAGNOSIS — Z23 Encounter for immunization: Secondary | ICD-10-CM | POA: Diagnosis not present

## 2015-12-13 LAB — PULMONARY FUNCTION TEST
DL/VA % PRED: 92 %
DL/VA: 4.07 ml/min/mmHg/L
DLCO COR: 15.01 ml/min/mmHg
DLCO UNC % PRED: 45 %
DLCO cor % pred: 53 %
DLCO unc: 12.74 ml/min/mmHg
FEF 25-75 PRE: 1.46 L/s
FEF 25-75 Post: 2.39 L/sec
FEF2575-%CHANGE-POST: 63 %
FEF2575-%PRED-POST: 94 %
FEF2575-%PRED-PRE: 57 %
FEV1-%Change-Post: 11 %
FEV1-%PRED-POST: 76 %
FEV1-%PRED-PRE: 68 %
FEV1-Post: 2.07 L
FEV1-Pre: 1.85 L
FEV1FVC-%Change-Post: 6 %
FEV1FVC-%PRED-PRE: 98 %
FEV6-%CHANGE-POST: 5 %
FEV6-%PRED-POST: 74 %
FEV6-%Pred-Pre: 70 %
FEV6-Post: 2.53 L
FEV6-Pre: 2.4 L
FEV6FVC-%PRED-PRE: 104 %
FEV6FVC-%Pred-Post: 104 %
FVC-%Change-Post: 4 %
FVC-%Pred-Post: 71 %
FVC-%Pred-Pre: 68 %
FVC-Post: 2.53 L
FVC-Pre: 2.41 L
POST FEV1/FVC RATIO: 82 %
Post FEV6/FVC ratio: 100 %
Pre FEV1/FVC ratio: 77 %
Pre FEV6/FVC Ratio: 100 %
RV % pred: 73 %
RV: 1.57 L
TLC % pred: 63 %
TLC: 4.05 L

## 2015-12-13 MED ORDER — PREDNISONE 10 MG PO TABS
ORAL_TABLET | ORAL | 0 refills | Status: DC
Start: 2015-12-13 — End: 2016-04-09

## 2015-12-13 NOTE — Assessment & Plan Note (Signed)
Prednisone 10 mg tabs  Take 2 tabs daily with food x 5ds, then 1 tab daily with food x 5ds then STOP  

## 2015-12-13 NOTE — Assessment & Plan Note (Signed)
Please contact Dr. Estanislado Pandy for Imuran or alternative medication if this is too expensive  Fibrosis appears stable on breathing test.

## 2015-12-13 NOTE — Progress Notes (Signed)
   Subjective:    Patient ID: Curtis Flores, male    DOB: [birth year], 60 y.o.   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  Seen by Dr Lamonte Sakai for persistent leucocytosis, bone marrow biopsy neg  He underwent ablation and amiodarone was stopped  12/13/2015  Chief Complaint  Patient presents with  . Follow-up    PFT done today, no issues with breathing but would like Prednisone to help with arthritis pain.    his PFTs and his CT chest from 05/2015 which showed slight worsening  PFTs today show stabilization His symptoms also seem to be stable no worsening of his breathing or cough No significant heartburn He is able to walk for longer distances His arthritis seems to be worse which he attributes to change of weather and requests some prednisone today  On his prior visit I had discussed with Dr. Estanislado Pandy and she had started him on Imuran-but he states this was very expensive and he was unable to obtain this   Significant tests/ events 2012 - presented with a symmetric polyarthritis of elbows, wrists, hands, neck, shoulders & feet  CXR at [facility] showed bibasal interstitial fibrosis with hilar lymphadenopathy With POS RA 89 & CCP 122, ANA neg, HLA B 27 neg, CK 87  PPD neg, hep panel neg  HRCT - extensive regions of ground-glass attenuation and most pronounced in the lower lobes of the lungs bilaterally. These areas demonstrate internal interstitial prominence with a large amount of microcystic honeycombingwith some cylindrical bronchiectasis and bronchiolectasis. moderate hiatal hernia was also noted.   PFTs 2013 showed intraprenchymal restricton with FVC around 71% & DLCO 56% c/w ILD, no airway obstruction.  Rpt PFTs 10/2012 show mild drop in FVC to 66%, DLCO preserved   PFT  05/2015 FEV1 70%, ratio 82, FVC 66%, no BD response, DLCO 46%. DLCO has decreased from 2014.  CT chest 05/12/15 showed ILD/NSIP , slight progression compared from 2013.   PFT 12/2015 >> FVC 71%, DLCO 45% unc     Review of Systems neg for any significant sore throat, dysphagia, itching, sneezing, nasal congestion or excess/ purulent secretions, fever, chills, sweats, unintended wt loss, pleuritic or exertional cp, hempoptysis, orthopnea pnd or change in chronic leg swelling.   Also denies presyncope, palpitations, heartburn, abdominal pain, nausea, vomiting, diarrhea or change in bowel or urinary habits, dysuria,hematuria, rash, arthralgias, visual complaints, headache, numbness weakness or ataxia.      Objective:   Physical Exam  Gen. Pleasant, well-nourished, in no distress ENT - no lesions, no post nasal drip Neck: No JVD, no thyromegaly, no carotid bruits Lungs: no use of accessory muscles, no dullness to percussion, bibasal rales, no rhonchi  Cardiovascular: Rhythm regular, heart sounds  normal, no murmurs or gallops, no peripheral edema Musculoskeletal: swollen PIP jts, no cyanosis or clubbing         Assessment & Plan:

## 2015-12-13 NOTE — Patient Instructions (Signed)
Prednisone 10 mg tabs  Take 2 tabs daily with food x 5ds, then 1 tab daily with food x 5ds then STOP  Please contact Dr. Estanislado Pandy for Imuran or alternative medication if this is too expensive  Fibrosis appears stable on breathing test.

## 2016-01-15 DIAGNOSIS — I10 Essential (primary) hypertension: Secondary | ICD-10-CM | POA: Diagnosis not present

## 2016-01-15 DIAGNOSIS — K219 Gastro-esophageal reflux disease without esophagitis: Secondary | ICD-10-CM | POA: Diagnosis not present

## 2016-01-15 DIAGNOSIS — I2510 Atherosclerotic heart disease of native coronary artery without angina pectoris: Secondary | ICD-10-CM | POA: Diagnosis not present

## 2016-01-15 DIAGNOSIS — E785 Hyperlipidemia, unspecified: Secondary | ICD-10-CM | POA: Diagnosis not present

## 2016-01-15 DIAGNOSIS — E784 Other hyperlipidemia: Secondary | ICD-10-CM | POA: Diagnosis not present

## 2016-01-15 DIAGNOSIS — R799 Abnormal finding of blood chemistry, unspecified: Secondary | ICD-10-CM | POA: Diagnosis not present

## 2016-01-15 DIAGNOSIS — D649 Anemia, unspecified: Secondary | ICD-10-CM | POA: Diagnosis not present

## 2016-01-19 ENCOUNTER — Encounter (HOSPITAL_COMMUNITY): Payer: Self-pay | Admitting: Hematology & Oncology

## 2016-01-19 ENCOUNTER — Encounter (HOSPITAL_COMMUNITY): Payer: Medicare Other | Attending: Hematology & Oncology | Admitting: Hematology & Oncology

## 2016-01-19 ENCOUNTER — Encounter (HOSPITAL_COMMUNITY): Payer: Medicare Other

## 2016-01-19 VITALS — BP 148/52 | HR 59 | Temp 98.3°F | Resp 16 | Wt 167.6 lb

## 2016-01-19 DIAGNOSIS — D72829 Elevated white blood cell count, unspecified: Secondary | ICD-10-CM

## 2016-01-19 DIAGNOSIS — L4050 Arthropathic psoriasis, unspecified: Secondary | ICD-10-CM

## 2016-01-19 DIAGNOSIS — D638 Anemia in other chronic diseases classified elsewhere: Secondary | ICD-10-CM | POA: Diagnosis not present

## 2016-01-19 DIAGNOSIS — D508 Other iron deficiency anemias: Secondary | ICD-10-CM

## 2016-01-19 LAB — CBC WITH DIFFERENTIAL/PLATELET
BASOS ABS: 0.1 10*3/uL (ref 0.0–0.1)
Basophils Relative: 0 %
EOS PCT: 2 %
Eosinophils Absolute: 0.3 10*3/uL (ref 0.0–0.7)
HCT: 38.6 % — ABNORMAL LOW (ref 39.0–52.0)
Hemoglobin: 12.4 g/dL — ABNORMAL LOW (ref 13.0–17.0)
LYMPHS PCT: 23 %
Lymphs Abs: 3.9 10*3/uL (ref 0.7–4.0)
MCH: 25.9 pg — ABNORMAL LOW (ref 26.0–34.0)
MCHC: 32.1 g/dL (ref 30.0–36.0)
MCV: 80.8 fL (ref 78.0–100.0)
Monocytes Absolute: 1.3 10*3/uL — ABNORMAL HIGH (ref 0.1–1.0)
Monocytes Relative: 8 %
NEUTROS ABS: 11.3 10*3/uL — AB (ref 1.7–7.7)
NEUTROS PCT: 67 %
PLATELETS: 349 10*3/uL (ref 150–400)
RBC: 4.78 MIL/uL (ref 4.22–5.81)
RDW: 15.7 % — ABNORMAL HIGH (ref 11.5–15.5)
WBC: 16.9 10*3/uL — AB (ref 4.0–10.5)

## 2016-01-19 LAB — COMPREHENSIVE METABOLIC PANEL
ALT: 25 U/L (ref 17–63)
AST: 27 U/L (ref 15–41)
Albumin: 3 g/dL — ABNORMAL LOW (ref 3.5–5.0)
Alkaline Phosphatase: 72 U/L (ref 38–126)
Anion gap: 6 (ref 5–15)
BUN: 10 mg/dL (ref 6–20)
CHLORIDE: 102 mmol/L (ref 101–111)
CO2: 28 mmol/L (ref 22–32)
CREATININE: 0.66 mg/dL (ref 0.61–1.24)
Calcium: 9.3 mg/dL (ref 8.9–10.3)
GFR calc Af Amer: >60 mL/min (ref >60)
Glucose, Bld: 109 mg/dL — ABNORMAL HIGH (ref 65–99)
Potassium: 4 mmol/L (ref 3.5–5.1)
Sodium: 136 mmol/L (ref 135–145)
Total Bilirubin: 0.4 mg/dL (ref 0.3–1.2)
Total Protein: 7.4 g/dL (ref 6.5–8.1)

## 2016-01-19 LAB — VITAMIN B12: Vitamin B-12: 885 pg/mL (ref 180–914)

## 2016-01-19 LAB — FOLATE: FOLATE: 16 ng/mL (ref >5.9)

## 2016-01-19 LAB — IRON AND TIBC
Iron: 19 ug/dL — ABNORMAL LOW (ref 45–182)
Saturation Ratios: 9 % — ABNORMAL LOW (ref 17.9–39.5)
TIBC: 202 ug/dL — ABNORMAL LOW (ref 250–450)
UIBC: 183 ug/dL

## 2016-01-19 LAB — FERRITIN: Ferritin: 245 ng/mL (ref 24–336)

## 2016-01-19 NOTE — Progress Notes (Signed)
[facility] Hematology/Oncology Progress Note  Name: Curtis Flores      MRN: [id number]    Date: 01/19/2016 Time:12:00 PM   REFERRING PHYSICIAN:  Dr. Estanislado Pandy  REASON FOR CONSULT:  Leukocytosis   DIAGNOSIS:  Leukocytosis with neutrophilia and monocytosis  HISTORY OF PRESENT ILLNESS:   Curtis Flores is a pleasant 60 yo black American man with a past medical history significant for rheumatoid arthritis, pulmonary fibrosis, psoriasis, HTN, hypercholesterolemia, lymphadenopathy in the mediastinum and hilar area consistent to reactive interstitial lung disease, and history of leukocytosis that was worked up by Dr. Sherryl Manges ([facility]) in 2013 and was felt to be reactive.  The patient remembers his work-up with Dr. Lamonte Sakai in 2013.  He remembers the bone marrow biopsy being negative for any malignancy findings.  Elevated wbc count is felt to be reactive.   Curtis Flores presents to the clinic today accompanied by his wife.   He is on [place], imuran and prednisone for his psoriasis and rheumatoid arthritis.   He says he hasn't been feeling very good. He says that he feels very tired.   His Rheumatoid arthritis has been bad. He says it's everywhere. His wrists, and neck hurt the worst.   He denies blood in his stool. No fever or chills. No cough. No B symptoms.   PAST MEDICAL HISTORY:   Past Medical History:  Diagnosis Date  . Abnormal CT scan, stomach   . Chronic systolic dysfunction of left ventricle   . Coronary artery disease    lateral STEMI 02/22/15  . GERD (gastroesophageal reflux disease)   . Hyperlipidemia   . Hypertension   . Ischemic cardiomyopathy   . LBBB (left bundle branch block)   . Leukocytosis    followed by hematology, reactive  . Lymphadenopathy   . Myocardial infarction 02/2015  . Psoriasis 2003  . Psoriatic arthritis ([place])   . Pulmonary fibrosis ([employer])   . Rheumatoid arthritis(714.0) 2012  . Typical atrial flutter (H[facility])     ALLERGIES: No Known  Allergies    MEDICATIONS: I have reviewed the patient's current medications.    Current Outpatient Prescriptions on File Prior to Visit  Medication Sig Dispense Refill  . Apremilast (OTEZLA) 30 MG TABS Take 1 tablet by mouth 2 (two) times daily.    . carvedilol (COREG) 12.5 MG tablet TAKE 1 AND 1/2 TABLETS BY MOUTH TWICE DAILY 60 tablet 8  . clopidogrel (PLAVIX) 75 MG tablet TAKE 1 TABLET BY MOUTH EVERY DAY 30 tablet 6  . ferrous sulfate 325 (65 FE) MG tablet Take 325 mg by mouth daily with breakfast.     . furosemide (LASIX) 40 MG tablet Take 1 tablet (40 mg total) by mouth daily. 30 tablet 11  . losartan (COZAAR) 100 MG tablet Take 1 tablet (100 mg total) by mouth daily. 90 tablet 3  . pantoprazole (PROTONIX) 40 MG tablet Take 1 tablet (40 mg total) by mouth 2 (two) times daily before a meal. 180 tablet 3  . predniSONE (DELTASONE) 10 MG tablet Take 2 tabs daily with food x 5ds, then 1 tab daily with food x 5ds then STOP 15 tablet 0   No current facility-administered medications on file prior to visit.      PAST SURGICAL HISTORY Past Surgical History:  Procedure Laterality Date  . CARDIAC CATHETERIZATION N/A 02/22/2015   Procedure: Left Heart Cath and Coronary Angiography;  Surgeon: Lorretta Harp, MD;  Location: [facility];  Service: Cardiovascular;  Laterality: N/A;  . CARDIAC CATHETERIZATION N/A 02/22/2015   Procedure: Coronary Stent Intervention;  Surgeon: Lorretta Harp, MD;  Location: [facility];  Service: Cardiovascular;  Laterality: N/A;  . CARDIOVERSION N/A 04/12/2015   Procedure: CARDIOVERSION;  Surgeon: Larey Dresser, MD;  Location: [facility] ENDOSCOPY;  Service: Cardiovascular;  Laterality: N/A;  . COLONOSCOPY  07/01/2003   EZM:OQHUTM colonic mucosa except for the proximal right colon in the area of ICV which was not seen completely due to inadequate bowel prep. followed with ACBE which was normal.   . COLONOSCOPY N/A 08/24/2015   Dr. Gala Romney: Normal colon. Next  colonoscopy in 10 years.  [name] ELECTROPHYSIOLOGIC STUDY N/A 05/30/2015   Atrial fibrillation ablation by Dr Rayann Heman  . ESOPHAGOGASTRODUODENOSCOPY N/A 08/24/2015   Dr. Gala Romney: Medium-sized hiatal hernia, erosive gastropathy. Cameron lesions. Esophageal mucosa distally suggestive of short segment Barrett's esophagus. Not confirmed on biopsy. Gastric biopsy with minimal chronic inflammation  . TEE WITHOUT CARDIOVERSION N/A 04/12/2015   Procedure: TRANSESOPHAGEAL ECHOCARDIOGRAM (TEE);  Surgeon: Larey Dresser, MD;  Location: [facility] ENDOSCOPY;  Service: Cardiovascular;  Laterality: N/A;    FAMILY HISTORY: Father passed in his 9's from unknown reason Mother passed at 26 from MI and history of EtOHism He had 5 brothers, all deceased via MVA.  He has 1 son and 1 daughter (40 and 57 respectively).  He has an uncle with RA.  SOCIAL HISTORY:  reports that he quit smoking about 12 years ago. His smoking use included Cigarettes. He has a 30.00 pack-year smoking history. He has never used smokeless tobacco. He reports that he does not drink alcohol or use drugs.  PERFORMANCE STATUS: The patient's performance status is 1 - Symptomatic but completely ambulatory  ROS: Review of Systems  Constitutional: Positive for malaise/fatigue.  HENT: Negative.   Eyes: Negative.   Respiratory: Negative.   Cardiovascular: Negative.   Gastrointestinal: Negative.  Negative for blood in stool.  Genitourinary: Negative.   Musculoskeletal: Negative.        Rheumatoid Arthritis (worse in wrists and neck)  Skin: Negative.        Psoriasis   Neurological: Negative.   Endo/Heme/Allergies: Negative.   Psychiatric/Behavioral: Negative.   All other systems reviewed and are negative.  14 point review of systems was performed and is negative except as detailed under history of present illness and above  PHYSICAL EXAM: Most Recent Vital Signs: Blood pressure (!) 148/52, pulse (!) 59, temperature 98.3 F (36.8 C), temperature  source Oral, resp. rate 16, weight 167 lb 9.6 oz (76 kg), SpO2 100 %.    Physical Exam  Constitutional: He is oriented to person, place, and time and well-developed, well-nourished, and in no distress.  Wears glasses. Patient was able to get on the exam table without assistance.   HENT:  Head: Normocephalic and atraumatic.  Mouth/Throat: Oropharynx is clear and moist.  Eyes: Conjunctivae and EOM are normal. Pupils are equal, round, and reactive to light. Right eye exhibits no discharge. Left eye exhibits no discharge.  Neck: Normal range of motion. Neck supple. No tracheal deviation present. No thyromegaly present.  Cardiovascular: Normal rate, regular rhythm and normal heart sounds.  Exam reveals no gallop and no friction rub.   No murmur heard. Pulmonary/Chest: Effort normal and breath sounds normal. No respiratory distress. He has no wheezes. He has no rales.  Abdominal: Soft. Bowel sounds are normal. He exhibits no distension and no mass. There is no tenderness. There is no rebound and  no guarding.  Musculoskeletal: Normal range of motion.  Obvious RA in fingers and wrists  Lymphadenopathy:    He has no cervical adenopathy.  Neurological: He is alert and oriented to person, place, and time. Gait normal.  Skin: Skin is warm and dry.  Psychiatric: Mood, memory, affect and judgment normal.  Nursing note and vitals reviewed.    LABORATORY DATA:   Outside laboratory studies for review.  Results for KARTEL, WOLBERT (MRN [id number]) as of 01/19/2016 08:40   Ref. Range 01/19/2016 12:30 01/19/2016 12:31  Sodium Latest Ref Range: 135 - 145 mmol/L 136   Potassium Latest Ref Range: 3.5 - 5.1 mmol/L 4.0   Chloride Latest Ref Range: 101 - 111 mmol/L 102   CO2 Latest Ref Range: 22 - 32 mmol/L 28   BUN Latest Ref Range: 6 - 20 mg/dL 10   Creatinine Latest Ref Range: 0.61 - 1.24 mg/dL 0.66   Calcium Latest Ref Range: 8.9 - 10.3 mg/dL 9.3   EGFR (Non-African Amer.) Latest Ref Range: >60  mL/min >60   EGFR (African American) Latest Ref Range: >60 mL/min >60   Glucose Latest Ref Range: 65 - 99 mg/dL 109 (H)   Anion gap Latest Ref Range: 5 - 15  6   Alkaline Phosphatase Latest Ref Range: 38 - 126 U/L 72   Albumin Latest Ref Range: 3.5 - 5.0 g/dL 3.0 (L)   AST Latest Ref Range: 15 - 41 U/L 27   ALT Latest Ref Range: 17 - 63 U/L 25   Total Protein Latest Ref Range: 6.5 - 8.1 g/dL 7.4   Total Bilirubin Latest Ref Range: 0.3 - 1.2 mg/dL 0.4   Iron Latest Ref Range: 45 - 182 ug/dL 19 (L)   UIBC Latest Units: ug/dL 183   TIBC Latest Ref Range: 250 - 450 ug/dL 202 (L)   Saturation Ratios Latest Ref Range: 17.9 - 39.5 % 9 (L)   Ferritin Latest Ref Range: 24 - 336 ng/mL 245   Folate Latest Ref Range: >5.9 ng/mL  16.0  Vitamin B12 Latest Ref Range: 180 - 914 pg/mL 885   WBC Latest Ref Range: 4.0 - 10.5 K/uL 16.9 (H)   RBC Latest Ref Range: 4.22 - 5.81 MIL/uL 4.78   Hemoglobin Latest Ref Range: 13.0 - 17.0 g/dL 12.4 (L)   HCT Latest Ref Range: 39.0 - 52.0 % 38.6 (L)   MCV Latest Ref Range: 78.0 - 100.0 fL 80.8   MCH Latest Ref Range: 26.0 - 34.0 pg 25.9 (L)   MCHC Latest Ref Range: 30.0 - 36.0 g/dL 32.1   RDW Latest Ref Range: 11.5 - 15.5 % 15.7 (H)   Platelets Latest Ref Range: 150 - 400 K/uL 349    Interpretation (BCRAL):  Comment   Comments: (NOTE)  The quantitative RT-PCR assay is negative for the b2a2 and b3a2  (p210) and e1a2 (p190) fusion gene transcripts found in chronic  myelogenous leukemia and Philadelphia positive acute lymphocytic  leukemia. These results do not rule out the presence of low levels of  BCR-ABL1 transcript below the level of detection of this assay, or  the presence of rare BCR-ABL1 transcripts not detected by this assay.        Pathologist smear review  Status: Finalresult Visible to patient:  Not Released Nextappt: 05/09/2015 at 12:20 PM in Oncology ([facility]) Dx:  Leukocytosis         20moago    Path Review  Reviewed By BViolet Baldy M.D.  Comments: 08.08.16  NORMOCYTIC ANEMIA,  MILD LEUKOCYTOSIS.  Performed at [facility]      Specimen Collected: 10/07/14 1:26 PM Last Resulted: 10/10/14 10:31 AM           PATHOLOGY:    04/09/2014  Diagnosis Bone Marrow, Aspirate,Biopsy, and Clot, left posterior iliac crest - NORMOCELLULAR BONE MARROW FOR AGE WITH TRILINEAGE HEMATOPOIESIS. - A FEW SMALL LYMPHOID AGGREGATES PRESENT. - INCREASED IRON STORES.  FLOW CYTOMETRY REPORT INTERPRETATION Interpretation Peripheral Blood Flow Cytometry - PREDOMINANCE OF T LYMPHOCYTES WITH NO ABERRANT PHENOTYPE. - MINOR B-CELL POPULATION IDENTIFIED. Diagnosis Comment: The majority of lymphocytes consists of T cells with no aberrant phenotype. B cells represent a minor population (less than 10% of lymphocytes) with extremely dim staining for surface immunoglobulin light chains that hinder assessment and/or quantitation of clonality. However, an abnormal B-cell phenotype such as expression of CD10 or CD5 is not identified. Clinical correlation is recommended. (BNS:ecj 10/11/2014) Susanne Greenhouse MD Pathologist, Electronic Signature (Case signed 10/11/2014) GROSS AND MICROSCOPIC INFORMATION Specimen Clinical Information leukocytosis Source Peripheral Blood Flow Cytometry Microscopic   ASSESSMENT/PLAN:  Leukocytosis, reactive Negative workup in 2013, including negative bone marrow biopsy. Rheumatoid arthritis Psoriatic arthritis Anemia of chronic disease Hypertension Iron studies c/w anemia chronic disease   I reviewed the patient's laboratory studies with him in detail and advised him that at this point I feel his leukocytosis is reactive and secondary to his RA. I advised him however that patients with rheumatoid arthritis are more prone to develop lymphoproliferative disease and I feel that he needs to be followed at least every 6 months. He is  agreeable to this. We will therefore plan on a 6 month return with repeat CBC and peripheral smear review.  He will follow-up with his primary care physician in regards to his blood pressure.  We will follow up with Mr. Garriga in 6 months.  Blood work from today was reivewed with the patient. Results are noted above. Iron studies are c/w anemia of chronic disease however given low iron saturation if anemia worsens could consider dose of Iv iron if needed. He is up to date on colonoscopy and upper endocsopy.   RTC in 6 months.  Orders Placed This Encounter  Procedures  . CBC with Differential    Standing Status:   Future    Number of Occurrences:   1    Standing Expiration Date:   01/18/2017  . Comprehensive metabolic panel    Standing Status:   Future    Number of Occurrences:   1    Standing Expiration Date:   01/18/2017  . Ferritin    Standing Status:   Future    Number of Occurrences:   1    Standing Expiration Date:   01/18/2017  . Iron and TIBC    Standing Status:   Future    Number of Occurrences:   1    Standing Expiration Date:   01/18/2017  . Folate    Standing Status:   Future    Number of Occurrences:   1    Standing Expiration Date:   01/18/2017  . Vitamin B12    Standing Status:   Future    Number of Occurrences:   1    Standing Expiration Date:   01/18/2017  . CBC with Differential    Standing Status:   Future    Standing Expiration Date:   01/18/2017  . Comprehensive metabolic panel  Standing Status:   Future    Standing Expiration Date:   01/18/2017     All questions were answered. The patient knows to call the clinic with any problems, questions or concerns. We can certainly see the patient much sooner if necessary.  This document serves as a record of services personally performed by Ancil Linsey, MD. It was created on her behalf by Martinique Casey, a trained medical scribe. The creation of this record is based on the scribe's personal observations  and the provider's statements to them. This document has been checked and approved by the attending provider.  I have reviewed the above documentation for accuracy and completeness, and I agree with the above.  This note is electronically signed by: Molli Hazard, MD   01/19/2016 12:00 PM

## 2016-01-19 NOTE — Patient Instructions (Signed)
[place] at [facility] Discharge Instructions  RECOMMENDATIONS MADE BY THE CONSULTANT AND ANY TEST RESULTS WILL BE SENT TO YOUR REFERRING PHYSICIAN.  Exam with Dr.  Muse today. Please check in at the front desk as you leave today for labs.   Return to [place] in 6 months for labs and follow up.    Thank you for choosing [place] at [facility] to provide your oncology and hematology care.  To afford each patient quality time with our provider, please arrive at least 15 minutes before your scheduled appointment time.   Beginning January 23rd 2017 lab work for the [facility] will be done in the  Main lab at [facility] on 1st floor. If you have a lab appointment with the [facility] please come in thru the  Main Entrance and check in at the main information desk  You need to re-schedule your appointment should you arrive 10 or more minutes late.  We strive to give you quality time with our providers, and arriving late affects you and other patients whose appointments are after yours.  Also, if you no show three or more times for appointments you may be dismissed from the clinic at the providers discretion.     Again, thank you for choosing [facility].  Our hope is that these requests will decrease the amount of time that you wait before being seen by our physicians.       _____________________________________________________________  Should you have questions after your visit to [facility], please contact our office at [phone] between the hours of 8:30 a.m. and 4:30 p.m.  Voicemails left after 4:30 p.m. will not be returned until the following business day.  For prescription refill requests, have your pharmacy contact our office.         Resources For Cancer Patients and their Caregivers ? American Cancer Society: Can assist with transportation, wigs, general needs, runs Look Good Feel Better.         725-848-7405 ? Cancer Care: Provides financial assistance, online support groups, medication/co-pay assistance.  1-800-813-HOPE 432-716-7175) ? [place] Assists [place] cancer patients and their families through emotional , educational and financial support.  669-782-2932 ? [place] DSS Where to apply for food stamps, Medicaid and utility assistance. 726-031-3190 ? [employer]: Transportation to medical appointments. 475-209-4727 ? Social Security Administration: May apply for disability if have a Stage IV cancer. (901)472-2261 (720) 458-2810 ? [employer], Disability and Transit Services: Assists with nutrition, care and transit needs. [place] Support Programs: @10RELATIVEDAYS @ > Cancer Support Group  2nd Tuesday of the month 1pm-2pm, Journey Room  > Creative Journey  3rd Tuesday of the month 1130am-1pm, Journey Room  > Look Good Feel Better  1st Wednesday of the month 10am-12 noon, Journey Room (Call [place] to register [phone])

## 2016-01-22 DIAGNOSIS — I10 Essential (primary) hypertension: Secondary | ICD-10-CM | POA: Diagnosis not present

## 2016-01-22 DIAGNOSIS — R7303 Prediabetes: Secondary | ICD-10-CM | POA: Diagnosis not present

## 2016-01-22 DIAGNOSIS — R799 Abnormal finding of blood chemistry, unspecified: Secondary | ICD-10-CM | POA: Diagnosis not present

## 2016-01-22 DIAGNOSIS — R899 Unspecified abnormal finding in specimens from other organs, systems and tissues: Secondary | ICD-10-CM | POA: Diagnosis not present

## 2016-01-22 DIAGNOSIS — E785 Hyperlipidemia, unspecified: Secondary | ICD-10-CM | POA: Diagnosis not present

## 2016-01-23 DIAGNOSIS — R7303 Prediabetes: Secondary | ICD-10-CM | POA: Diagnosis not present

## 2016-01-23 DIAGNOSIS — Z0000 Encounter for general adult medical examination without abnormal findings: Secondary | ICD-10-CM | POA: Diagnosis not present

## 2016-01-23 DIAGNOSIS — I10 Essential (primary) hypertension: Secondary | ICD-10-CM | POA: Diagnosis not present

## 2016-01-23 DIAGNOSIS — E785 Hyperlipidemia, unspecified: Secondary | ICD-10-CM | POA: Diagnosis not present

## 2016-01-23 DIAGNOSIS — R899 Unspecified abnormal finding in specimens from other organs, systems and tissues: Secondary | ICD-10-CM | POA: Diagnosis not present

## 2016-01-30 ENCOUNTER — Telehealth: Payer: Self-pay | Admitting: Pharmacist

## 2016-01-30 ENCOUNTER — Ambulatory Visit: Payer: Self-pay | Admitting: Rheumatology

## 2016-01-30 DIAGNOSIS — Z1211 Encounter for screening for malignant neoplasm of colon: Secondary | ICD-10-CM | POA: Diagnosis not present

## 2016-01-30 NOTE — Telephone Encounter (Signed)
Received phone call from patient's wife, Saveer Bivens, with questions regarding Cosentyx for [place].  She states that today in the mail they received a Cosentyx welcome package with information on Cosentyx and a cooler and freezer pack.  She states patient made an appointment for tomorrow, 01/31/16, for initial Cosentyx injection.  She was confused as she did not think we were planning to start patient on Cosentyx at this time and she wanted to clarify.  She reports no prescription was mailed just information on the medication.   I completed a thorough review of patient's chart and noted that we applied for Cosentyx patient assistance program in July 2017.  Later a decision was made not to initiate Cosentyx and to instead add Imuran to [place].  [employer] was alerted to place Cosentyx prescription on hold on 10/18/15.  I discussed with Dr. Estanislado Pandy who confirmed the current plan is to continue [place] and Imuran.    I made phone calls to Cosentyx patient assistance program and to [employer] Program.  Both programs report that patient is not scheduled to have any medication shipped to him at this time.  They were not able to explain why patient got information in the mail today.    I called Mrs. Wey back and informed her that Cosentyx is not being mailed at this time and patient should continue his current therapy. Mrs. Dennard voiced understanding.  Mrs. Knabe denied any other medication related questions or concerns.    Patient will cancel nursing visit and will follow up as scheduled on 02/16/16.    Elisabeth Most, Pharm.D., BCPS Clinical Pharmacist Pager: [phone] Phone: [phone] 01/30/2016 5:48 PM

## 2016-01-31 ENCOUNTER — Ambulatory Visit: Payer: Self-pay

## 2016-02-06 ENCOUNTER — Ambulatory Visit: Payer: Self-pay | Admitting: Rheumatology

## 2016-02-12 NOTE — Progress Notes (Signed)
Office Visit Note  Patient: Curtis Flores             Date of Birth: 02-25-1956           MRN: [id number]             PCP: [name], MD Referring: Dione Housekeeper, MD Visit Date: 02/16/2016 Occupation: @GUAROCC @    Subjective:  No chief complaint on file. Follow-up on psoriatic arthritis and high risk prescription which includes oh tesla 30 mg twice a day and Imuran 100 mg daily.  History of Present Illness: Curtis Flores is a 60 y.o. male    Patient is here for follow-up from 10/23/2015 visit. Patient is no better compared to the last visit with his joint discomfort. He's taking oh tesla 30 mg twice a day and Imuran 100 mg mg daily for his interstitial lung disease coming from his psoriatic arthritis.  Patient is actually gotten worse since the last visit. He was struggling then it's even worse now. Patient states that his wife has to help him put on clothing at times because he can't use his hands as effectively. He has pain at night to his joints. His elbows were not as bad last visit but this visit there is a 15 contracture on the right and 30 contracture on the left. There is also 90 of abduction of bilateral shoulder joint. Overall patient who did get good results with oh tesla in the past is no longer getting good results. Adding Imuran has given him no extra relief. Note that at one time we gave him a raver but he had to stop taking Arava because it was too expensive for him to bite.  We are unable to give him Cosentyx but that's not been to be possible because Cosentyx is not allowed/not a good option because he has scar tissue in the lungs  =============   Activities of Daily Living:  Patient reports morning stiffness for all day   Patient Reports nocturnal pain.  Difficulty dressing/grooming: Reports Difficulty climbing stairs: Reports Difficulty getting out of chair: Reports Difficulty using hands for taps, buttons, cutlery, and/Flores writing:  Reports   Review of Systems  Constitutional: Negative for fatigue.  HENT: Negative for mouth sores and mouth dryness.   Eyes: Negative for dryness.  Respiratory: Negative for shortness of breath.   Gastrointestinal: Negative for constipation and diarrhea.  Musculoskeletal: Negative for myalgias and myalgias.  Skin: Negative for sensitivity to sunlight.  Neurological: Negative for memory loss.  Psychiatric/Behavioral: Negative for sleep disturbance.    PMFS History:  Patient Active Problem List   Diagnosis Date Noted  . Psoriatic arthritis ([place]) 02/14/2016  . High risk medication use 02/14/2016  . NICM (nonischemic cardiomyopathy) ([place]) 08/29/2015  . Essential hypertension 08/29/2015  . Abnormal CT scan, stomach   . Hiatal hernia   . GERD (gastroesophageal reflux disease) 08/01/2015  . Weight loss, unintentional 08/01/2015  . IDA (iron deficiency anemia) 06/06/2015  . Heme positive stool 06/06/2015  . PAF (paroxysmal atrial fibrillation) ([place]) 05/30/2015  . CAD S/P percutaneous coronary angioplasty 04/07/2015  . Atrial flutter ([place]) 04/07/2015  . 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 Acute on chronic systolic congestive heart failure ([place]) 04/07/2015  . LBBB (left bundle branch block) 04/07/2015  . Wide-complex tachycardia ([place]) 04/07/2015  . Hyperlipidemia 03/07/2015  . Hypertension   . Psoriasis   . Rheumatoid arthritis ([place])   . Pulmonary fibrosis ([place]) 03/21/2011    Past Medical History:  Diagnosis Date  . Abnormal CT scan, stomach   . Chronic systolic dysfunction of left ventricle   . Coronary artery disease    lateral STEMI 02/22/15  . GERD (gastroesophageal reflux disease)   . Hyperlipidemia   . Hypertension   . Ischemic cardiomyopathy   . LBBB (left bundle branch block)   . Leukocytosis    followed by hematology, reactive  . Lymphadenopathy   . Myocardial infarction 02/2015  . Psoriasis 2003  . Psoriatic arthritis ([place])   . Pulmonary  fibrosis ([place])   . Rheumatoid arthritis(714.0) 2012  . Typical atrial flutter ([facility])     Family History  Problem Relation Age of Onset  . Hypertension Mother   . Colon cancer Neg Hx    Past Surgical History:  Procedure Laterality Date  . CARDIAC CATHETERIZATION N/A 02/22/2015   Procedure: Left Heart Cath and Coronary Angiography;  Surgeon: Lorretta Harp, MD;  Location: [facility];  Service: Cardiovascular;  Laterality: N/A;  . CARDIAC CATHETERIZATION N/A 02/22/2015   Procedure: Coronary Stent Intervention;  Surgeon: Lorretta Harp, MD;  Location: [facility];  Service: Cardiovascular;  Laterality: N/A;  . CARDIOVERSION N/A 04/12/2015   Procedure: CARDIOVERSION;  Surgeon: Larey Dresser, MD;  Location: [facility] ENDOSCOPY;  Service: Cardiovascular;  Laterality: N/A;  . COLONOSCOPY  07/01/2003   [id number] colonic mucosa except for the proximal right colon in the area of ICV which was not seen completely due to inadequate bowel prep. followed with ACBE which was normal.   . COLONOSCOPY N/A 08/24/2015   Dr. Gala Romney: Normal colon. Next colonoscopy in 10 years.  [name] ELECTROPHYSIOLOGIC STUDY N/A 05/30/2015   Atrial fibrillation ablation by Dr Rayann Heman  . ESOPHAGOGASTRODUODENOSCOPY N/A 08/24/2015   Dr. Gala Romney: Medium-sized hiatal hernia, erosive gastropathy. Cameron lesions. Esophageal mucosa distally suggestive of short segment Barrett's esophagus. Not confirmed on biopsy. Gastric biopsy with minimal chronic inflammation  . TEE WITHOUT CARDIOVERSION N/A 04/12/2015   Procedure: TRANSESOPHAGEAL ECHOCARDIOGRAM (TEE);  Surgeon: Larey Dresser, MD;  Location: [facility] ENDOSCOPY;  Service: Cardiovascular;  Laterality: N/A;   Social History   Social History Narrative  . No narrative on file     Objective: Vital Signs: BP 122/64 (BP Location: Left Arm, Patient Position: Sitting, Cuff Size: Large)   Pulse (!) 58   Resp 12   Ht 5\' 7"  (1.702 m)   Wt 165 lb (74.8 kg)   BMI 25.84 kg/m    Physical  Exam  Constitutional: He is oriented to person, place, and time. He appears well-developed and well-nourished.  HENT:  Head: Normocephalic and atraumatic.  Eyes: Conjunctivae and EOM are normal. Pupils are equal, round, and reactive to light.  Neck: Normal range of motion. Neck supple.  Cardiovascular: Normal rate, regular rhythm and normal heart sounds.  Exam reveals no gallop and no friction rub.   No murmur heard. Pulmonary/Chest: Effort normal and breath sounds normal. No respiratory distress. He has no wheezes. He has no rales. He exhibits no tenderness.  Abdominal: Soft. He exhibits no distension and no mass. There is no tenderness. There is no guarding.  Musculoskeletal: Normal range of motion.  Lymphadenopathy:    He has no cervical adenopathy.  Neurological: He is alert and oriented to person, place, and time. He exhibits normal muscle tone. Coordination normal.  Skin: Skin is warm and dry. Capillary refill takes less than 2 seconds. No rash noted.  Psychiatric: He has a normal mood and affect. His behavior is normal. Judgment  and thought content normal.  Vitals reviewed.    Musculoskeletal Exam:  Decreased range of motion of his joints at shoulders which are 90 of abduction Right elbow with a contracture of 15 and left elbow with a contracture of 30. Note that this was not there on the last visit Decrease flexion and extension of wrists and decreased grip strength of both hands secondary to swelling and pain Fiber myalgia tender points are all absent  CDAI Exam: CDAI Homunculus Exam:   Tenderness:  RUE: ulnohumeral and radiohumeral and wrist LUE: ulnohumeral and radiohumeral Right hand: 1st MCP, 2nd MCP and 5th MCP Left hand: 1st MCP, 2nd MCP, 3rd MCP, 5th MCP, 3rd PIP and 4th PIP  Swelling:  RUE: ulnohumeral and radiohumeral LUE: ulnohumeral and radiohumeral Right hand: 1st MCP, 2nd MCP and 5th MCP Left hand: 1st MCP, 2nd MCP, 3rd MCP, 5th MCP, 3rd PIP and 4th  PIP  Joint Counts:  CDAI Tender Joint count: 12 CDAI Swollen Joint count: 11     Investigation: No additional findings.   Imaging: No results found.  Speciality Comments: No specialty comments available.    Procedures:  No procedures performed Allergies: Patient has no known allergies.   Assessment / Plan:     Visit Diagnoses: Rheumatoid arthritis involving multiple sites with positive rheumatoid factor ([place])  Psoriatic arthritis ([place])  Psoriasis  Pulmonary fibrosis (HCC)  High risk medication use - Inadequate response/failed: Enbrel, Humira, and today OTEZLA; 02/16/2016 we applied for Cosentyx; plan: Continue Imuran 100MG  QD;  Pain in joint involving multiple sites - 02/16/2016: Has been flaring of his psoriatic arthritis for multiple months now and having joint pain/discomfort/stiffness.   Plan: Prednisone taper: 5 mg; 4 pills for 4 days, 3 pills for 4 days, 2 pills for 4 days, 1 pill daily for 30 days  =============== NOTE:   Patient is here for follow-up from 10/23/2015 visit. Patient is no better compared to the last visit with his joint discomfort. He's taking oh tesla 30 mg twice a day and Imuran 100 mg mg daily for his interstitial lung disease coming from his psoriatic arthritis.  Patient is actually gotten worse since the last visit. He was struggling then it's even worse now. Patient states that his wife has to help him put on clothing at times because he can't use his hands as effectively. He has pain at night to his joints. His elbows were not as bad last visit but this visit there is a 15 contracture on the right and 30 contracture on the left. There is also 90 of abduction of bilateral shoulder joint. Overall patient who did get good results with oh tesla in the past is no longer getting good results. Adding Imuran has given him no extra relief. Note that at one time we gave him a raver but he had to stop taking Arava because it was too expensive  for him to bite.  We might want to give him Cosentyx but that's not been to be possible because Cosentyx is not allowed/not a good option because he has scar tissue in the lungs ==============  Plan: I discussed with Dr. Estanislado Pandy and we will apply for Cosentyx Entex. Once Cosentyx is approved we will stop the oh tesla. He will continue Imuran when he is on the Cosentyx Entex We will also consult with his pulmonologist to confirm that they're okay with these medications In the meanwhile I'll give him prednisone taper to help him out and bring her back in 2  months.  Note: Labs from December 12 are within normal limits that include CBC with differential and CMP with GFR  Next labs will be due in 2 months. When he comes to our office in 2 months we will do it then. Orders: No orders of the defined types were placed in this encounter.  Meds ordered this encounter  Medications  . predniSONE (DELTASONE) 5 MG tablet    Sig: Prednisone 5 mg 4 pills for 4 days 3 pills for 4 days 2 pills for 4 days 1 pill for 30 days Half pill for 30 days Stop    Dispense:  81 tablet    Refill:  0    Order Specific Question:   Supervising Provider    Answer:   Lyda Perone    Face-to-face time spent with patient was 40 minutes. 50% of time was spent in counseling and coordination of care.  Follow-Up Instructions: Return in about 8 weeks (around 04/12/2016) for PsA, ps, inadeq resposne, flare, multi jt pain, contracture.Eliezer Lofts, PA-C

## 2016-02-13 ENCOUNTER — Ambulatory Visit: Payer: Medicare Other | Admitting: Internal Medicine

## 2016-02-13 ENCOUNTER — Other Ambulatory Visit: Payer: Self-pay | Admitting: Rheumatology

## 2016-02-13 DIAGNOSIS — Z79899 Other long term (current) drug therapy: Secondary | ICD-10-CM | POA: Diagnosis not present

## 2016-02-14 DIAGNOSIS — Z79899 Other long term (current) drug therapy: Secondary | ICD-10-CM | POA: Insufficient documentation

## 2016-02-14 DIAGNOSIS — L4050 Arthropathic psoriasis, unspecified: Secondary | ICD-10-CM | POA: Insufficient documentation

## 2016-02-14 LAB — CBC WITH DIFFERENTIAL/PLATELET
Basophils Absolute: 0 cells/uL (ref 0–200)
Basophils Relative: 0 %
EOS PCT: 1 %
Eosinophils Absolute: 149 cells/uL (ref 15–500)
HCT: 36.8 % — ABNORMAL LOW (ref 38.5–50.0)
HEMOGLOBIN: 11.8 g/dL — AB (ref 13.2–17.1)
LYMPHS ABS: 4172 {cells}/uL — AB (ref 850–3900)
Lymphocytes Relative: 28 %
MCH: 25.1 pg — ABNORMAL LOW (ref 27.0–33.0)
MCHC: 32.1 g/dL (ref 32.0–36.0)
MCV: 78.1 fL — ABNORMAL LOW (ref 80.0–100.0)
MPV: 10.4 fL (ref 7.5–12.5)
Monocytes Absolute: 1341 cells/uL — ABNORMAL HIGH (ref 200–950)
Monocytes Relative: 9 %
NEUTROS ABS: 9238 {cells}/uL — AB (ref 1500–7800)
NEUTROS PCT: 62 %
PLATELETS: 420 10*3/uL — AB (ref 140–400)
RBC: 4.71 MIL/uL (ref 4.20–5.80)
RDW: 15.4 % — ABNORMAL HIGH (ref 11.0–15.0)
WBC: 14.9 10*3/uL — AB (ref 3.8–10.8)

## 2016-02-14 LAB — COMPLETE METABOLIC PANEL WITH GFR
ALBUMIN: 3.2 g/dL — AB (ref 3.6–5.1)
ALK PHOS: 75 U/L (ref 40–115)
ALT: 19 U/L (ref 9–46)
AST: 23 U/L (ref 10–35)
BUN: 14 mg/dL (ref 7–25)
CALCIUM: 9.3 mg/dL (ref 8.6–10.3)
CO2: 30 mmol/L (ref 20–31)
Chloride: 100 mmol/L (ref 98–110)
Creat: 0.73 mg/dL (ref 0.70–1.25)
GFR, Est African American: >89 mL/min (ref >=60)
Glucose, Bld: 90 mg/dL (ref 65–99)
POTASSIUM: 4.5 mmol/L (ref 3.5–5.3)
Sodium: 140 mmol/L (ref 135–146)
Total Bilirubin: 0.5 mg/dL (ref 0.2–1.2)
Total Protein: 6.9 g/dL (ref 6.1–8.1)

## 2016-02-14 NOTE — Progress Notes (Signed)
And her on previous message. Labs stable

## 2016-02-14 NOTE — Progress Notes (Signed)
Abscess stable

## 2016-02-16 ENCOUNTER — Ambulatory Visit (INDEPENDENT_AMBULATORY_CARE_PROVIDER_SITE_OTHER): Payer: Medicare Other | Admitting: Rheumatology

## 2016-02-16 ENCOUNTER — Telehealth: Payer: Self-pay | Admitting: *Deleted

## 2016-02-16 ENCOUNTER — Encounter: Payer: Self-pay | Admitting: Rheumatology

## 2016-02-16 VITALS — BP 122/64 | HR 58 | Resp 12 | Ht 67.0 in | Wt 165.0 lb

## 2016-02-16 DIAGNOSIS — I2121 ST elevation (STEMI) myocardial infarction involving left circumflex coronary artery: Secondary | ICD-10-CM

## 2016-02-16 DIAGNOSIS — M2550 Pain in unspecified joint: Secondary | ICD-10-CM | POA: Diagnosis not present

## 2016-02-16 DIAGNOSIS — J8410 Pulmonary fibrosis, unspecified: Secondary | ICD-10-CM

## 2016-02-16 DIAGNOSIS — M0579 Rheumatoid arthritis with rheumatoid factor of multiple sites without organ or systems involvement: Secondary | ICD-10-CM | POA: Diagnosis not present

## 2016-02-16 DIAGNOSIS — L4050 Arthropathic psoriasis, unspecified: Secondary | ICD-10-CM | POA: Diagnosis not present

## 2016-02-16 DIAGNOSIS — L409 Psoriasis, unspecified: Secondary | ICD-10-CM

## 2016-02-16 DIAGNOSIS — Z79899 Other long term (current) drug therapy: Secondary | ICD-10-CM | POA: Diagnosis not present

## 2016-02-16 MED ORDER — PREDNISONE 5 MG PO TABS: ORAL_TABLET | ORAL | 0 refills | Status: DC

## 2016-02-16 NOTE — Telephone Encounter (Signed)
Dr. Elisabeth Most has applied for Cosentyx

## 2016-02-16 NOTE — Patient Instructions (Signed)
Secukinumab injection What is this medicine? SECUKINUMAB (sek ue KIN ue mab) is used to treat psoriasis. It is also used to treat psoriatic arthritis and ankylosing spondylitis. COMMON BRAND NAME(S): Cosentyx What should I tell my health care provider before I take this medicine? They need to know if you have any of these conditions: -Crohn's disease, ulcerative colitis, or other inflammatory bowel disease -infection or history of infection -other conditions affecting the immune system -recently received or are scheduled to receive a vaccine -tuberculosis, a positive skin test for tuberculosis, or have recently been in close contact with someone who has tuberculosis -an unusual or allergic reaction to secukinumab, other medicines, latex, rubber, foods, dyes, or preservatives -pregnant or trying to get pregnant -breast-feeding How should I use this medicine? This medicine is for injection under the skin. It may be administered by a healthcare professional in a hospital or clinic setting or at home. If you get this medicine at home, you will be taught how to prepare and give this medicine. Use exactly as directed. Take your medicine at regular intervals. Do not take your medicine more often than directed. It is important that you put your used needles and syringes in a special sharps container. Do not put them in a trash can. If you do not have a sharps container, call your pharmacist or healthcare provider to get one. A special MedGuide will be given to you by the pharmacist with each prescription and refill. Be sure to read this information carefully each time. Talk to your pediatrician regarding the use of this medicine in children. Special care may be needed. What if I miss a dose? It is important not to miss your dose. Call your doctor of health care professional if you are unable to keep an appointment. If you give yourself the medicine and you miss a dose, take it as soon as you can. If  it is almost time for your next dose, take only that dose. Do not take double or extra doses. What may interact with this medicine? Do not take this medicine with any of the following medications: -live virus vaccines This medicine may also interact with the following medications: -cyclosporine -inactivated vaccines -warfarin What should I watch for while using this medicine? Tell your doctor or healthcare professional if your symptoms do not start to get better or if they get worse. You will be tested for tuberculosis (TB) before you start this medicine. If your doctor prescribes any medicine for TB, you should start taking the TB medicine before starting this medicine. Make sure to finish the full course of TB medicine. Call your doctor or healthcare professional for advice if you get a fever, chills or sore throat, or other symptoms of a cold or flu. Do not treat yourself. This drug decreases your body's ability to fight infections. Try to avoid being around people who are sick. This medicine can decrease the response to a vaccine. If you need to get vaccinated, tell your healthcare professional if you have received this medicine within the last 6 months. Extra booster doses may be needed. Talk to your doctor to see if a different vaccination schedule is needed. What side effects may I notice from receiving this medicine? Side effects that you should report to your doctor or health care professional as soon as possible: -allergic reactions like skin rash, itching or hives, swelling of the face, lips, or tongue -signs and symptoms of infection like fever or chills; cough; sore throat; pain  or trouble passing urine Side effects that usually do not require medical attention (report to your doctor or health care professional if they continue or are bothersome): -diarrhea Where should I keep my medicine? Keep out of the reach of children. Store the prefilled syringe or injection pen in a  refrigerator between 2 to 8 degrees C (36 to 46 degrees F). Keep the syringe or the pen in the original carton until ready for use. Protect from light. Do not freeze. Do not shake. Prior to use, remove the syringe or pen from the refrigerator and use within 1 hour. Throw away any unused medicine after the expiration date on the label.  2017 Elsevier/Gold Standard (2015-03-23 11:48:31)

## 2016-02-16 NOTE — Progress Notes (Signed)
Pharmacy Note  Subjective:  Patient presents today to the [facility] to see Mr. Carlyon Shadow.  Patient is being initiated on Cosentyx.  Patient was seen by the pharmacist for counseling on Cosentyx.  Objective:  CBC    Component Value Date/Time   WBC 14.9 (H) 02/13/2016 1138   RBC 4.71 02/13/2016 1138   HGB 11.8 (L) 02/13/2016 1138   HGB 14.5 08/12/2011 1256   HCT 36.8 (L) 02/13/2016 1138   HCT 44.7 08/12/2011 1256   PLT 420 (H) 02/13/2016 1138   PLT 240 08/12/2011 1256   MCV 78.1 (L) 02/13/2016 1138   MCV 82.2 08/12/2011 1256   MCH 25.1 (L) 02/13/2016 1138   MCHC 32.1 02/13/2016 1138   RDW 15.4 (H) 02/13/2016 1138   RDW 14.7 (H) 08/12/2011 1256   LYMPHSABS 4,172 (H) 02/13/2016 1138   LYMPHSABS 5.5 (H) 08/12/2011 1256   MONOABS 1,341 (H) 02/13/2016 1138   MONOABS 1.6 (H) 08/12/2011 1256   EOSABS 149 02/13/2016 1138   EOSABS 0.2 08/12/2011 1256   BASOSABS 0 02/13/2016 1138   BASOSABS 0.1 08/12/2011 1256   CMP     Component Value Date/Time   NA 140 02/13/2016 1138   K 4.5 02/13/2016 1138   CL 100 02/13/2016 1138   CO2 30 02/13/2016 1138   GLUCOSE 90 02/13/2016 1138   BUN 14 02/13/2016 1138   CREATININE 0.73 02/13/2016 1138   CALCIUM 9.3 02/13/2016 1138   PROT 6.9 02/13/2016 1138   ALBUMIN 3.2 (L) 02/13/2016 1138   AST 23 02/13/2016 1138   ALT 19 02/13/2016 1138   ALKPHOS 75 02/13/2016 1138   BILITOT 0.5 02/13/2016 1138   GFRNONAA >89 02/13/2016 1138   GFRAA >89 02/13/2016 1138    TB Gold: negative (09/26/15) Hepatitis panel: negative (12/10/10)  Assessment/Plan:  Counseled patient that Cosentyx is a IL-17 inhibitor that works to reduce pain and inflammation associated with arthritis.  Counseled patient on purpose, proper use, and adverse effects of Cosentyx. Reviewed the most common adverse effects of infection, inflammatory bowel disease, and allergic reaction.  Provided patient with medication education material and answered all questions.  Patient  consented to Cosentyx.  Will upload consent into his chart.    Patient does not have prescription drug insurance.  Will apply for the [employer] patient assistance foundation.  Both patient and provider portion of application were completed during visit today.  Patient's income information is needed prior to submission.  Patient's wife, [name], reports she will mail in the income information.  Will submit form for patient assistance foundation as soon as we receive that information.  Advised patient that he will need a nursing visit for the initial Cosentyx injection.  Patient voiced understanding.     Elisabeth Most, Pharm.D., BCPS Clinical Pharmacist Pager: [phone] Phone: [phone] 02/16/2016 11:42 AM

## 2016-02-16 NOTE — Telephone Encounter (Signed)
-----   Message from Eliezer Lofts, [place] sent at 02/16/2016 11:37 AM EST -----  Apply for cosentyx  See  plan beef below for full details ===>  Plan: apply for Cosentyx . Once Cosentyx is approved we will stop the OTEZLA. He will continue Imuran when he is on the [employer] ====> send letter to his pulmonologist (ALVA) to confirm that they're okay with these medications  In the meanwhile I'll give him prednisone taper to help him out and bring her back in 2 months.

## 2016-02-21 ENCOUNTER — Telehealth: Payer: Self-pay | Admitting: *Deleted

## 2016-02-21 NOTE — Telephone Encounter (Signed)
Letter for clearance on Cosentyx has been faxed to Dr. Elsworth Soho.

## 2016-02-21 NOTE — Telephone Encounter (Signed)
-----   Message from Cyndia Skeeters, [facility] sent at 02/16/2016  5:07 PM EST ----- Seth Bake,   I applied for the Cosentyx, but I wanted to make sure you sent the letter to Dr. Elsworth Soho.    Thank you,   Apolonio Schneiders  ----- Message ----- From: Eliezer Lofts, PA-C Sent: 02/16/2016  11:37 AM To: Carole Binning, LPN, Cyndia Skeeters, [facility]   Apply for cosentyx  See  plan beef below for full details ===>  Plan: apply for Cosentyx . Once Cosentyx is approved we will stop the OTEZLA. He will continue Imuran when he is on the [employer] ====> send letter to his pulmonologist (ALVA) to confirm that they're okay with these medications  In the meanwhile I'll give him prednisone taper to help him out and bring her back in 2 months.

## 2016-02-22 ENCOUNTER — Telehealth: Payer: Self-pay

## 2016-02-22 NOTE — Telephone Encounter (Signed)
Spoke to Mrs. Judeth Horn to inform her that we received her financial documents to complete [name] (husband) [employer] Patient Gaffer for [employer].   The forms have been faxed and we will contact her with any updated information.   Will scan documents in epic  [place], [place], CPhT

## 2016-02-23 ENCOUNTER — Telehealth: Payer: Self-pay | Admitting: Radiology

## 2016-02-23 NOTE — Telephone Encounter (Signed)
   Curtis Flores  is a mutual patient of ours that we would like to start on Cosentyx but he carries the diagnosis of Pulmonary fibrosis . We would like you clinical opinion as to whether or not there is any contraindication of the patient starting this therapy. Please advise our office of your opinion concerning this treatment option.

## 2016-02-26 ENCOUNTER — Encounter (HOSPITAL_COMMUNITY): Payer: Self-pay | Admitting: Hematology & Oncology

## 2016-02-27 NOTE — Telephone Encounter (Signed)
No contraindication from pulmonary standpoint

## 2016-03-01 ENCOUNTER — Telehealth: Payer: Self-pay

## 2016-03-01 NOTE — Telephone Encounter (Signed)
Received a fox of approval from [facility] for Cosentyx until 02/27/17 at no out-of-pocket cost to the patient. Spoke with Ms.Curtis Flores and she voiced understanding. She was concerned about the date of shipment.   I called [facility] and was told that the pharmacy will contact the patient to schedule a time to have medicine shipped.   Left message on voicemail to inform patient and also to remind him to schedule a nursing visit for the first injection. Pt. Will call back with any questions they may have.  Will upload documents in epic  [place], [place], CPhT

## 2016-03-05 ENCOUNTER — Ambulatory Visit (INDEPENDENT_AMBULATORY_CARE_PROVIDER_SITE_OTHER): Payer: Medicare Other | Admitting: Internal Medicine

## 2016-03-05 ENCOUNTER — Encounter: Payer: Self-pay | Admitting: Internal Medicine

## 2016-03-05 VITALS — BP 134/64 | HR 60 | Temp 98.1°F | Ht 67.0 in | Wt 169.2 lb

## 2016-03-05 DIAGNOSIS — K219 Gastro-esophageal reflux disease without esophagitis: Secondary | ICD-10-CM | POA: Diagnosis not present

## 2016-03-05 DIAGNOSIS — D508 Other iron deficiency anemias: Secondary | ICD-10-CM

## 2016-03-05 NOTE — Progress Notes (Signed)
Primary Care Physician:  [name], MD Primary Gastroenterologist:  Dr. Gala Romney  Pre-Procedure History & Physical: HPI:  Curtis Flores is a 61 y.o. 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 Leukocytosis noted recently felt to be due to rheumatoid arthritis. Reflux symptoms well controlled on Protonix 40 mg once daily. Negative colonoscopy 6 months ago. Essentially negative EGD aside from gastric erosions.  Felt to have more of a picture of chronic disease secondary to autoimmune disease. Has been on prednisone. Weight is up 8 pounds by our scales since last office visit. Stools chronically dark on iron but no hematochezia or melena. Appetite described as being good. He continues on Plavix.  Past Medical History:  Diagnosis Date  . Abnormal CT scan, stomach   . Chronic systolic dysfunction of left ventricle   . Coronary artery disease    lateral STEMI 02/22/15  . GERD (gastroesophageal reflux disease)   . Hyperlipidemia   . Hypertension   . Ischemic cardiomyopathy   . LBBB (left bundle branch block)   . Leukocytosis    followed by hematology, reactive  . Lymphadenopathy   . Myocardial infarction 02/2015  . Psoriasis 2003  . Psoriatic arthritis ([place])   . Pulmonary fibrosis ([place])   . Rheumatoid arthritis(714.0) 2012  . Typical atrial flutter [facility])     Past Surgical History:  Procedure Laterality Date  . CARDIAC CATHETERIZATION N/A 02/22/2015   Procedure: Left Heart Cath and Coronary Angiography;  Surgeon: Lorretta Harp, MD;  Location: [place] CV LAB;  Service: Cardiovascular;  Laterality: N/A;  . CARDIAC CATHETERIZATION N/A 02/22/2015   Procedure: Coronary Stent Intervention;  Surgeon: Lorretta Harp, MD;  Location: [place] CV LAB;  Service: Cardiovascular;  Laterality: N/A;  . CARDIOVERSION N/A 04/12/2015   Procedure: CARDIOVERSION;  Surgeon: Larey Dresser, MD;  Location: [facility] ENDOSCOPY;  Service:  Cardiovascular;  Laterality: N/A;  . COLONOSCOPY  07/01/2003   [id number] colonic mucosa except for the proximal right colon in the area of ICV which was not seen completely due to inadequate bowel prep. followed with ACBE which was normal.   . COLONOSCOPY N/A 08/24/2015   Dr. Gala Romney: Normal colon. Next colonoscopy in 10 years.  [name] ELECTROPHYSIOLOGIC STUDY N/A 05/30/2015   Atrial fibrillation ablation by Dr Rayann Heman  . ESOPHAGOGASTRODUODENOSCOPY N/A 08/24/2015   Dr. Gala Romney: Medium-sized hiatal hernia, erosive gastropathy. Cameron lesions. Esophageal mucosa distally suggestive of short segment Barrett's esophagus. Not confirmed on biopsy. Gastric biopsy with minimal chronic inflammation  . TEE WITHOUT CARDIOVERSION N/A 04/12/2015   Procedure: TRANSESOPHAGEAL ECHOCARDIOGRAM (TEE);  Surgeon: Larey Dresser, MD;  Location: [place];  Service: Cardiovascular;  Laterality: N/A;    Prior to Admission medications   Medication Sig Start Date End Date Taking? Authorizing Provider  Apremilast (OTEZLA) 30 MG TABS Take 1 tablet by mouth 2 (two) times daily.   Yes Historical Provider, MD  atorvastatin (LIPITOR) 80 MG tablet TAKE 1 TABLET (80 MG TOTAL) BY MOUTH DAILY AT 6 PM. 01/13/16  Yes Historical Provider, MD  azaTHIOprine (IMURAN) 50 MG tablet Take 50 mg by mouth 2 (two) times daily.    Yes Historical Provider, MD  carvedilol (COREG) 12.5 MG tablet TAKE 1 AND 1/2 TABLETS BY MOUTH TWICE DAILY Patient taking differently: TAKE 1 AND 1/2 TABLETS BY MOUTH TWICE DAILY. Takes one tablet twice daily 10/06/15  Yes Lorretta Harp, MD  clopidogrel (PLAVIX) 75 MG tablet TAKE 1 TABLET BY MOUTH EVERY  DAY 11/10/15  Yes Lorretta Harp, MD  ferrous sulfate 325 (65 FE) MG tablet Take 325 mg by mouth 2 (two) times daily with a meal.    Yes Historical Provider, MD  furosemide (LASIX) 40 MG tablet Take 1 tablet (40 mg total) by mouth daily. 08/29/15  Yes Luke K Kilroy, PA-C  losartan (COZAAR) 100 MG tablet Take 1 tablet (100 mg  total) by mouth daily. 07/05/15  Yes [name], MD  pantoprazole (PROTONIX) 40 MG tablet Take 1 tablet (40 mg total) by mouth 2 (two) times daily before a meal. Patient taking differently: Take 40 mg by mouth daily.  10/26/15  Yes Mahala Menghini, PA-C  predniSONE (DELTASONE) 10 MG tablet Take 2 tabs daily with food x 5ds, then 1 tab daily with food x 5ds then STOP 12/13/15  Yes Rigoberto Noel, MD  predniSONE (DELTASONE) 5 MG tablet Prednisone 5 mg 4 pills for 4 days 3 pills for 4 days 2 pills for 4 days 1 pill for 30 days Half pill for 30 days Stop Patient not taking: Reported on 03/05/2016 02/16/16   Naitik Panwala, PA-C  ranitidine (ZANTAC) 300 MG tablet Take by mouth. 01/15/16 01/14/17  Historical Provider, MD    Allergies as of 03/05/2016  . (No Known Allergies)    Family History  Problem Relation Age of Onset  . Hypertension Mother   . Colon cancer Neg Hx     Social History   Social History  . Marital status: Married    Spouse name: N/A  . Number of children: 2  . Years of education: N/A   Occupational History  . unemployed     not working do to arthritis; used to be Patent attorney for a [employer] History Main Topics  . Smoking status: Former Smoker    Packs/day: 1.00    Years: 30.00    Types: Cigarettes    Quit date: 03/21/2003  . Smokeless tobacco: Never Used  . Alcohol use No     Comment: H/O case of beer weekly x 20 years, quiting in 2000-ish.  . Drug use: No     Comment: H/O marijuana use many years ago.  [name] Sexual activity: Yes    Birth control/ protection: None   Other Topics Concern  . Not on file   Social History Narrative  . No narrative on file    Review of Systems: See HPI, otherwise negative ROS  Physical Exam: BP 134/64   Pulse 60   Temp 98.1 F (36.7 C) (Oral)   Ht 5\' 7"  (1.702 m)   Wt 169 lb 3.2 oz (76.7 kg)   BMI 26.50 kg/m  General:   Alert,  pleasant and cooperative in NAD Neck:  Supple; no masses or thyromegaly.  No significant cervical adenopathy. Lungs:  Clear throughout to auscultation.   No wheezes, crackles, or rhonchi. No acute distress. Heart:  Regular rate and rhythm; no murmurs, clicks, rubs,  or gallops. Abdomen: Non-distended, normal bowel sounds.  Soft and nontender without appreciable mass or hepatosplenomegaly.  Pulses:  Normal pulses noted. Extremities:  Without clubbing or edema.  Impression:  Pleasant 61 year old gentleman with well-controlled GERD and chronic mixed anemia. Up-to-date on EGD and colonoscopy. Patient is on Plavix. It is possible patient could have an occult lesion in the small bowel to account for some component of his anemia. As discussed with the patient, currently, I feel the yield of a small bowel capsule study would be  relatively low.  Recommendations:  Continue Protonix 40 mg daily  Stool for occult blood testing  Office visit in 8 months  Further recommendations to follow      Notice: This dictation was prepared with Dragon dictation along with smaller phrase technology. Any transcriptional errors that result from this process are unintentional and may not be corrected upon review.

## 2016-03-05 NOTE — Patient Instructions (Signed)
Continue Protonix 40 mg daily  Stool for occult blood testing  Office visit in 8 months  Further recommendations to follow

## 2016-03-06 ENCOUNTER — Telehealth: Payer: Self-pay | Admitting: Rheumatology

## 2016-03-06 NOTE — Telephone Encounter (Signed)
Patient advised to schedule an appointment here in the office and to stop the Green 1 week prior to his appointment. Patient is going to call back this afternoon to schedule the appointment.

## 2016-03-06 NOTE — Telephone Encounter (Signed)
-----   Message from Rigoberto Noel, MD sent at 02/27/2016 10:39 AM EST -----   ----- Message ----- From: Candice Camp, RT Sent: 02/23/2016   9:52 AM To: Rigoberto Noel, MD

## 2016-03-06 NOTE — Telephone Encounter (Signed)
Please ask him to schedule appointment. He should stop [place] one week prior to his appointment.

## 2016-03-07 ENCOUNTER — Other Ambulatory Visit: Payer: Self-pay | Admitting: Physician Assistant

## 2016-03-19 ENCOUNTER — Ambulatory Visit (INDEPENDENT_AMBULATORY_CARE_PROVIDER_SITE_OTHER): Payer: Medicare Other

## 2016-03-19 DIAGNOSIS — D509 Iron deficiency anemia, unspecified: Secondary | ICD-10-CM

## 2016-03-19 LAB — IFOBT (OCCULT BLOOD): IFOBT: POSITIVE

## 2016-03-28 ENCOUNTER — Other Ambulatory Visit: Payer: Self-pay

## 2016-03-28 DIAGNOSIS — D509 Iron deficiency anemia, unspecified: Secondary | ICD-10-CM

## 2016-04-05 ENCOUNTER — Telehealth: Payer: Self-pay

## 2016-04-05 ENCOUNTER — Telehealth: Payer: Self-pay | Admitting: Rheumatology

## 2016-04-05 ENCOUNTER — Telehealth: Payer: Self-pay | Admitting: Pharmacist

## 2016-04-05 NOTE — Telephone Encounter (Signed)
Contacted [employer] and advised them the medication should be shipped to the patient and not to the office. They have made a note and will reach out to the patient and set up shipment.

## 2016-04-05 NOTE — Telephone Encounter (Signed)
Received a fax from [place] asking for provider portion of [place] patient assistance program.  Plan per 02/16/16 was to change Otezla to Cosentyx.  Reviewed patient's chart.  Noted that [employer] called today regarding Cosentyx shipment.  Patient should be receiving his medication on Tuesday.  I called [place] support and spoke to [facility].  I advised them that we will not be sending provider portion (Reference [id number]) for [facility] application as patient is discontinuing therapy.    I called patient and spoke to his wife.  Advised that he should be getting Cosentyx on Tuesday.  Reviewed that Cosentyx first dose must be administered in the office at least 1 week after his last Otezla dose.  Advised patient to store Cosentyx in the refrigerator until he is ready to use then bring one dose with him to the office.  Mrs. Reidy confirms patient has still been taking [place]. Reviewed with her that if patient stops [place] this weekend, the earliest he should schedule initial injection is 04/15/16.  She also reports he has an endoscopy scheduled for 04/16/16.  Will plan for initiation of Cosentyx after patient's endoscopy.

## 2016-04-05 NOTE — Telephone Encounter (Signed)
Plan to do an in office cosentyx start 1 week after endoscopy.    Also tell patient stop otezla 1 week before the planned endoscopy.

## 2016-04-05 NOTE — Telephone Encounter (Signed)
[employer] called about cosentyx for patient. It will ship on Monday and will be delivered at the office on Tuesday.

## 2016-04-05 NOTE — Telephone Encounter (Signed)
Received a call from [facility], a representative from [place] Patient Assistance. He states that  [name] application was missing part "B" which is the clinic's portions. He will fax the document to the clinic and we will fill out and fax back. He did verify that all other documents had been received from the patient.     Refrence: [id number]   Document will be sent to scan center once received.   Keriann Rankin, [place], CPhT

## 2016-04-08 NOTE — Telephone Encounter (Signed)
I spoke to patient's wife.  Advised her that patient should stop Otezla 1 week before the endoscopy, and plan to start Cosentyx in the office 1 week after the endoscopy.  Patient has visit scheduled for 04/27/15 (9 days after procedure) and will plan to bring Cosentyx and start at that visit.  Patient's wife denied any further questions.     Elisabeth Most, Pharm.D., BCPS, CPP Clinical Pharmacist Pager: [phone] Phone: [phone] 04/08/2016 8:28 AM

## 2016-04-09 ENCOUNTER — Telehealth: Payer: Self-pay | Admitting: Rheumatology

## 2016-04-09 ENCOUNTER — Telehealth: Payer: Self-pay | Admitting: Pharmacist

## 2016-04-09 MED ORDER — PREDNISONE 5 MG PO TABS: ORAL_TABLET | ORAL | 0 refills | Status: DC

## 2016-04-09 NOTE — Telephone Encounter (Signed)
Patient advised prescription for prednisone sent to the pharmacy. Patient states he is not diabetic.

## 2016-04-09 NOTE — Addendum Note (Signed)
Addended by: Carole Binning on: 04/09/2016 04:36 PM   Modules accepted: Orders

## 2016-04-09 NOTE — Telephone Encounter (Signed)
Patient's wife called and states he is having a stomach procedure on 04/17/16 and they took him off of Dexter for the procedure. The last dose of [place] was taken on Saturday and she is questioning what the patient will do for pain between now and his scheduled appointment here on 04/26/16 where she states he will start cosentyx. Please call.

## 2016-04-09 NOTE — Telephone Encounter (Signed)
Received a shipment of patient's Cosentyx from patient assistance program.  Medication was stored in the refrigerator.  I called patient to inform him we received the prescription.  Left a message on his machine.    Elisabeth Most, Pharm.D., BCPS, CPP Clinical Pharmacist Pager: [phone] Phone: [phone] 04/09/2016 4:48 PM

## 2016-04-09 NOTE — Telephone Encounter (Signed)
---------------------    OK to Prescribe  Prednisone 5mg :  4po qAM x 5 days,  3po qAM x 5 days, 2po qAM x 5 days, 1po qAM x 5 days, 1/2po qAM x 5 days, then stop. disp: 53 tablets w/ no refills.  Take until all gone and exactly as Rx'd. If diabetic, watch sugar levels and have pcp make adjustments for better control if needed.

## 2016-04-09 NOTE — Telephone Encounter (Signed)
Patient states he had his last dose of Otzela on 04/07/15. Patient states he is having pain all over his body. Patient is requesting a prescription for prednisone.

## 2016-04-10 ENCOUNTER — Telehealth: Payer: Self-pay | Admitting: General Practice

## 2016-04-10 NOTE — Telephone Encounter (Signed)
Patient's wife made aware.

## 2016-04-10 NOTE — Telephone Encounter (Signed)
Patient is scheduled for a Given's Capsule Study on 2/14.  He was recently prescribed Prednisone for arthritis and wanted to make sure this will not interfere with the Given's Capsule.  Patient can be reached at [phone])

## 2016-04-10 NOTE — Telephone Encounter (Signed)
Prednisone should not interfere with a capsule study

## 2016-04-10 NOTE — Telephone Encounter (Signed)
I spoke to Mrs. Judeth Horn and informed her that patient's first dose of Cosnetyx was delivered to the office.  Mrs. Vantrease reports they have not received the remainder of the loading dose yet.  I spoke to [place] at [facility] patient assistance program.  She reports they send the first dose.  If patient tolerates first dose, they can request the remainder of the loading dose.  The medication will be sent overnight once requested.  I informed Mrs. Judeth Horn of this information.  Advised patient that we can assist him in ordering Cosentyx from patient assistance program during the visit when he gets his initial injection.  Mrs. Scarce denies any other questions at this time.    Elisabeth Most, Pharm.D., BCPS, CPP Clinical Pharmacist Pager: [phone] Phone: [phone] 04/10/2016 10:18 AM

## 2016-04-15 DIAGNOSIS — R7309 Other abnormal glucose: Secondary | ICD-10-CM | POA: Diagnosis not present

## 2016-04-15 DIAGNOSIS — R899 Unspecified abnormal finding in specimens from other organs, systems and tissues: Secondary | ICD-10-CM | POA: Diagnosis not present

## 2016-04-15 DIAGNOSIS — E784 Other hyperlipidemia: Secondary | ICD-10-CM | POA: Diagnosis not present

## 2016-04-15 DIAGNOSIS — R799 Abnormal finding of blood chemistry, unspecified: Secondary | ICD-10-CM | POA: Diagnosis not present

## 2016-04-15 DIAGNOSIS — R790 Abnormal level of blood mineral: Secondary | ICD-10-CM | POA: Diagnosis not present

## 2016-04-16 DIAGNOSIS — Z1159 Encounter for screening for other viral diseases: Secondary | ICD-10-CM | POA: Diagnosis not present

## 2016-04-16 DIAGNOSIS — L4050 Arthropathic psoriasis, unspecified: Secondary | ICD-10-CM | POA: Diagnosis not present

## 2016-04-16 DIAGNOSIS — D649 Anemia, unspecified: Secondary | ICD-10-CM | POA: Diagnosis not present

## 2016-04-16 DIAGNOSIS — I10 Essential (primary) hypertension: Secondary | ICD-10-CM | POA: Diagnosis not present

## 2016-04-16 DIAGNOSIS — J8410 Pulmonary fibrosis, unspecified: Secondary | ICD-10-CM | POA: Diagnosis not present

## 2016-04-16 DIAGNOSIS — I2510 Atherosclerotic heart disease of native coronary artery without angina pectoris: Secondary | ICD-10-CM | POA: Diagnosis not present

## 2016-04-16 DIAGNOSIS — E785 Hyperlipidemia, unspecified: Secondary | ICD-10-CM | POA: Diagnosis not present

## 2016-04-16 DIAGNOSIS — Z8679 Personal history of other diseases of the circulatory system: Secondary | ICD-10-CM | POA: Insufficient documentation

## 2016-04-17 ENCOUNTER — Encounter (HOSPITAL_COMMUNITY): Payer: Self-pay | Admitting: *Deleted

## 2016-04-17 ENCOUNTER — Ambulatory Visit (HOSPITAL_COMMUNITY)
Admission: RE | Admit: 2016-04-17 | Discharge: 2016-04-17 | Disposition: A | Discharge status: Home / Self Care | Payer: Medicare Other | Source: Ambulatory Visit | Attending: Internal Medicine | Admitting: Internal Medicine

## 2016-04-17 ENCOUNTER — Encounter (HOSPITAL_COMMUNITY)
Admission: RE | Disposition: A | Discharge status: Home / Self Care | Payer: Self-pay | Source: Ambulatory Visit | Attending: Internal Medicine

## 2016-04-17 DIAGNOSIS — D509 Iron deficiency anemia, unspecified: Secondary | ICD-10-CM

## 2016-04-17 DIAGNOSIS — K259 Gastric ulcer, unspecified as acute or chronic, without hemorrhage or perforation: Secondary | ICD-10-CM | POA: Insufficient documentation

## 2016-04-17 DIAGNOSIS — Z7902 Long term (current) use of antithrombotics/antiplatelets: Secondary | ICD-10-CM | POA: Diagnosis not present

## 2016-04-17 DIAGNOSIS — R195 Other fecal abnormalities: Secondary | ICD-10-CM | POA: Diagnosis not present

## 2016-04-17 DIAGNOSIS — D638 Anemia in other chronic diseases classified elsewhere: Secondary | ICD-10-CM | POA: Diagnosis not present

## 2016-04-17 HISTORY — PX: GIVENS CAPSULE STUDY: SHX5432

## 2016-04-17 SURGERY — IMAGING PROCEDURE, GI TRACT, INTRALUMINAL, VIA CAPSULE

## 2016-04-17 MED ORDER — SODIUM CHLORIDE 0.9 % IV SOLN: INTRAVENOUS | Status: DC

## 2016-04-19 ENCOUNTER — Encounter (HOSPITAL_COMMUNITY): Payer: Self-pay | Admitting: Internal Medicine

## 2016-04-19 ENCOUNTER — Ambulatory Visit: Payer: Medicare Other | Admitting: Gastroenterology

## 2016-04-19 ENCOUNTER — Telehealth: Payer: Self-pay | Admitting: Gastroenterology

## 2016-04-19 DIAGNOSIS — D509 Iron deficiency anemia, unspecified: Secondary | ICD-10-CM

## 2016-04-19 NOTE — Op Note (Signed)
@  LOGO@     Small Bowel [place] Capsule Study Procedure date:  04/17/2016  Referring Provider:  Garfield Cornea, MD  PCP:  Dr. [name], MD  Indication for procedure:  Mixed anemia (IDA/anemia of chronic disease), Hemoccult-positive stool. EGD June 2017 with erosive gastropathy, benign biopsies without H. pylori, Cameron lesions. EGD findings not felt to completely explain anemia. Colonoscopy June 2017 normal. Capsule endoscopy being done to further evaluate small bowel as a potential etiology for IDA/heme positive stool.   Patient data:  Wt: 165 pounds Ht: 5 foot 7 inches  Findings:  Patient swallowed capsule without any difficulty. Entire small bowel was imaged. Prep was adequate. Small bowel mucosa appeared normal.  First Gastric image:  51 seconds First Duodenal image: 13 minutes 11 seconds First Ileo-Cecal Valve image: 4 hours 49 minutes 31 seconds First Cecal image: 4 hours 51 minutes 50 seconds Gastric Passage time: 12 minutes  Small Bowel Passage time:  4 hours 38 minutes   Summary & Recommendations: 61 year old gentleman with history of IDA/anemia chronic disease with recent Hemoccult positive stool. GI workup complete including EGD/colonoscopy/small bowel capsule endoscopy. Outside of gastric erosions, Lysbeth Galas lesions nothing to explain IDA. Cannot rule out occult bleeding from upper GI tract from these findings in the setting of Plavix.  Would recommend monitoring hemoglobin, iron on a regular basis. Continue PPI therapy.  Please recheck CBC, iron/TIBC, ferritin next month.  Laureen Ochs. Bernarda Caffey [facility] [phone] 2/16/20189:44 AM   Images reviewed; agree with above assessment and recommendations

## 2016-04-19 NOTE — Telephone Encounter (Signed)
Please let patient know small bowel capsule results and recommendations as outlined below.    Summary & Recommendations: Small bowel capsule study normal.Would recommend monitoring hemoglobin, iron on a regular basis. Continue PPI therapy.  Please recheck CBC, iron/TIBC, ferritin next month.

## 2016-04-23 NOTE — Telephone Encounter (Signed)
Pt is aware. He wants to know if he needs to continue taking iron? He had blood work done at [facility] office last week. I have placed a copy of the labs in your box. Do you still want the pt to repeat his labs in 1 month?

## 2016-04-24 NOTE — Telephone Encounter (Signed)
Pt is aware. Lab order on file. 

## 2016-04-24 NOTE — Telephone Encounter (Signed)
Labs from 04/15/16: Iron 55, TIBC 192, sat 29%, H/H 13.9/41  He can drop iron back to once daily for now.   Recheck CBC, iron/tibc, ferritin in 3 months.

## 2016-04-25 DIAGNOSIS — M2550 Pain in unspecified joint: Secondary | ICD-10-CM | POA: Insufficient documentation

## 2016-04-25 NOTE — Addendum Note (Signed)
Addended by: Claudina Lick on: 04/25/2016 05:09 PM   Modules accepted: Orders

## 2016-04-25 NOTE — Progress Notes (Signed)
Office Visit Note  Patient: Curtis Flores             Date of Birth: 01/29/1956           MRN: [id number]             PCP: [name], MD Referring: Dione Housekeeper, MD Visit Date: 04/26/2016 Occupation: _0 @    Subjective:  Follow-up Psoriatic arthritis and psoriasis and changing from [place] to Cosentyx today; first loading injection done in office today. Seth Bake assisted the patient with the injection.  History of Present Illness: Curtis Flores is a 61 y.o. male   Last seen 02/16/2016. Cosentyx is now approved and patient presents today to start his first injection in office. He has failed multiple medications in the past including OTEZLA , Enbrel, Humira.  Therefore, today he'll be getting his first Cosentyx injection. We also stressed to him the importance of continuing the Imuran 100 mg daily. Patient understands and is agreeable. Because he is flaring at the moment with his left elbow hurting right wrist and right hand and neck stiffness and poor mobility secondary to feet pain; patient would benefit from another prednisone taper. We did give him a taper back in December 2017 office visit. Unfortunately because of his flare we are needing to prescribe it one more time until the Cosentyx gets into his bloodstream and has a positive affect.  Activities of Daily Living:  Patient reports morning stiffness for 6-12 hours.   Patient Reports nocturnal pain.  Difficulty dressing/grooming: Reports Difficulty climbing stairs: Reports Difficulty getting out of chair: Reports Difficulty using hands for taps, buttons, cutlery, and/or writing: Reports   Review of Systems  Constitutional: Negative for fatigue.  HENT: Negative for mouth sores and mouth dryness.   Eyes: Negative for dryness.  Respiratory: Negative for shortness of breath.   Gastrointestinal: Negative for constipation and diarrhea.  Musculoskeletal: Positive for arthralgias, joint pain, joint swelling  and morning stiffness. Negative for myalgias and myalgias.  Skin: Positive for rash and redness (PSORIASIS FLARE TO FACE/BEARD AREA/FOREHEAD/NASOLABIAL AREA). Negative for sensitivity to sunlight.  Neurological: Negative for memory loss.  Psychiatric/Behavioral: Negative for sleep disturbance.    PMFS History:  Patient Active Problem List   Diagnosis Date Noted  . Pain in joint involving multiple sites 04/25/2016  . Psoriatic arthritis ([place]) 02/14/2016  . High risk medication use 02/14/2016  . NICM (nonischemic cardiomyopathy) ([place]) 08/29/2015  . Essential hypertension 08/29/2015  . Abnormal CT scan, stomach   . Hiatal hernia   . GERD (gastroesophageal reflux disease) 08/01/2015  . Weight loss, unintentional 08/01/2015  . Iron deficiency anemia 06/06/2015  . Heme positive stool 06/06/2015  . PAF (paroxysmal atrial fibrillation) ([place]) 05/30/2015  . CAD S/P percutaneous coronary angioplasty 04/07/2015  . Atrial flutter ([place]) 04/07/2015  . 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 Acute on chronic systolic congestive heart failure ([place]) 04/07/2015  . LBBB (left bundle branch block) 04/07/2015  . Wide-complex tachycardia ([place]) 04/07/2015  . Hyperlipidemia 03/07/2015  . Hypertension   . Psoriasis   . Rheumatoid arthritis ([place])   . Pulmonary fibrosis ([place]) 03/21/2011    Past Medical History:  Diagnosis Date  . Abnormal CT scan, stomach   . Chronic systolic dysfunction of left ventricle   . Coronary artery disease    lateral STEMI 02/22/15  . GERD (gastroesophageal reflux disease)   . Hyperlipidemia   . Hypertension   . Ischemic cardiomyopathy   . LBBB (left bundle branch block)   .  Leukocytosis    followed by hematology, reactive  . Lymphadenopathy   . Myocardial infarction 02/2015  . Psoriasis 2003  . Psoriatic arthritis ([place])   . Pulmonary fibrosis ([place])   . Rheumatoid arthritis(714.0) 2012  . Typical atrial flutter ([facility])     Family History  Problem  Relation Age of Onset  . Hypertension Mother   . Colon cancer Neg Hx    Past Surgical History:  Procedure Laterality Date  . CARDIAC CATHETERIZATION N/A 02/22/2015   Procedure: Left Heart Cath and Coronary Angiography;  Surgeon: Lorretta Harp, MD;  Location: [facility];  Service: Cardiovascular;  Laterality: N/A;  . CARDIAC CATHETERIZATION N/A 02/22/2015   Procedure: Coronary Stent Intervention;  Surgeon: Lorretta Harp, MD;  Location: [facility];  Service: Cardiovascular;  Laterality: N/A;  . CARDIOVERSION N/A 04/12/2015   Procedure: CARDIOVERSION;  Surgeon: Larey Dresser, MD;  Location: [facility] ENDOSCOPY;  Service: Cardiovascular;  Laterality: N/A;  . COLONOSCOPY  07/01/2003   UXN:ATFTDD colonic mucosa except for the proximal right colon in the area of ICV which was not seen completely due to inadequate bowel prep. followed with ACBE which was normal.   . COLONOSCOPY N/A 08/24/2015   Dr. Gala Romney: Normal colon. Next colonoscopy in 10 years.  [name] ELECTROPHYSIOLOGIC STUDY N/A 05/30/2015   Atrial fibrillation ablation by Dr Rayann Heman  . ESOPHAGOGASTRODUODENOSCOPY N/A 08/24/2015   Dr. Gala Romney: Medium-sized hiatal hernia, erosive gastropathy. Cameron lesions. Esophageal mucosa distally suggestive of short segment Barrett's esophagus. Not confirmed on biopsy. Gastric biopsy with minimal chronic inflammation  . GIVENS CAPSULE STUDY N/A 04/17/2016   Procedure: GIVENS CAPSULE STUDY;  Surgeon: Daneil Dolin, MD;  [facility];  Service: Endoscopy;  Laterality: N/A;  Pt to arrive at 8:00 am for 8:30 am appt  . TEE WITHOUT CARDIOVERSION N/A 04/12/2015   Procedure: TRANSESOPHAGEAL ECHOCARDIOGRAM (TEE);  Surgeon: Larey Dresser, MD;  Location: [facility] ENDOSCOPY;  Service: Cardiovascular;  Laterality: N/A;   Social History   Social History Narrative  . No narrative on file     Objective: Vital Signs: BP (!) 117/57   Pulse (!) 53   Resp 16   Ht '5\' 7"'$  (1.702 m)   Wt 170 lb (77.1 kg)    BMI 26.63 kg/m    Physical Exam  Constitutional: He is oriented to person, place, and time. He appears well-developed and well-nourished.  HENT:  Head: Normocephalic and atraumatic.  Eyes: Conjunctivae and EOM are normal. Pupils are equal, round, and reactive to light.  Neck: Normal range of motion. Neck supple.  Cardiovascular: Normal rate, regular rhythm and normal heart sounds.  Exam reveals no gallop and no friction rub.   No murmur heard. Pulmonary/Chest: Effort normal and breath sounds normal. No respiratory distress. He has no wheezes. He has no rales. He exhibits no tenderness.  Abdominal: Soft. He exhibits no distension and no mass. There is no tenderness. There is no guarding.  Musculoskeletal: Normal range of motion.  Lymphadenopathy:    He has no cervical adenopathy.  Neurological: He is alert and oriented to person, place, and time. He exhibits normal muscle tone. Coordination normal.  Skin: Skin is warm and dry. Capillary refill takes less than 2 seconds. No rash noted.  Psychiatric: He has a normal mood and affect. His behavior is normal. Judgment and thought content normal.  Nursing note and vitals reviewed.    Musculoskeletal Exam:  Decreased range of motion of joints in the shoulder joint left  elbow joint right wrist joint right hand left hand bilateral feet secondary to pain and swelling Grip strength is decreased bilaterally Fiber myalgia tender points are all absent  CDAI Exam: CDAI Homunculus Exam:   Tenderness:  RUE: wrist LUE: ulnohumeral and radiohumeral Right hand: 1st MCP, 2nd MCP, 3rd MCP, 4th MCP and 5th MCP RLE: tibiotalar  Swelling:  RUE: wrist LUE: ulnohumeral and radiohumeral Right hand: 1st MCP, 2nd MCP, 3rd MCP, 4th MCP and 5th MCP RLE: tibiotalar  Joint Counts:  CDAI Tender Joint count: 7 CDAI Swollen Joint count: 7   Investigation: Findings:  Inadequate response/failed: Enbrel, Humira, and today OTEZLA; 02/16/2016 we applied for  Cosentyx; plan: Continue Imuran 100MG Qd;  09/29/2015-TB Gold Negative   Clinical Support on 03/19/2016  Component Date Value Ref Range Status  . IFOBT 03/19/2016 Positive   Final  Orders Only on 02/13/2016  Component Date Value Ref Range Status  . Sodium 02/13/2016 140  135 - 146 mmol/L Final  . Potassium 02/13/2016 4.5  3.5 - 5.3 mmol/L Final  . Chloride 02/13/2016 100  98 - 110 mmol/L Final  . CO2 02/13/2016 30  20 - 31 mmol/L Final  . Glucose, Bld 02/13/2016 90  65 - 99 mg/dL Final  . BUN 02/13/2016 14  7 - 25 mg/dL Final  . Creat 02/13/2016 0.73  0.70 - 1.25 mg/dL Final   Comment:   For patients > or = 63 years of age: The upper reference limit for Creatinine is approximately 13% higher for people identified as African-American.     . Total Bilirubin 02/13/2016 0.5  0.2 - 1.2 mg/dL Final  . Alkaline Phosphatase 02/13/2016 75  40 - 115 U/L Final  . AST 02/13/2016 23  10 - 35 U/L Final  . ALT 02/13/2016 19  9 - 46 U/L Final  . Total Protein 02/13/2016 6.9  6.1 - 8.1 g/dL Final  . Albumin 02/13/2016 3.2* 3.6 - 5.1 g/dL Final  . Calcium 02/13/2016 9.3  8.6 - 10.3 mg/dL Final  . GFR, Est African American 02/13/2016 >89  >=60 mL/min Final  . GFR, Est Non African American 02/13/2016 >89  >=60 mL/min Final  . WBC 02/13/2016 14.9* 3.8 - 10.8 K/uL Final  . RBC 02/13/2016 4.71  4.20 - 5.80 MIL/uL Final  . Hemoglobin 02/13/2016 11.8* 13.2 - 17.1 g/dL Final  . HCT 02/13/2016 36.8* 38.5 - 50.0 % Final  . MCV 02/13/2016 78.1* 80.0 - 100.0 fL Final  . MCH 02/13/2016 25.1* 27.0 - 33.0 pg Final  . MCHC 02/13/2016 32.1  32.0 - 36.0 g/dL Final  . RDW 02/13/2016 15.4* 11.0 - 15.0 % Final  . Platelets 02/13/2016 420* 140 - 400 K/uL Final  . MPV 02/13/2016 10.4  7.5 - 12.5 fL Final  . Neutro Abs 02/13/2016 9238* 1,500 - 7,800 cells/uL Final  . Lymphs Abs 02/13/2016 4172* 850 - 3,900 cells/uL Final  . Monocytes Absolute 02/13/2016 1341* 200 - 950 cells/uL Final  . Eosinophils Absolute  02/13/2016 149  15 - 500 cells/uL Final  . Basophils Absolute 02/13/2016 0  0 - 200 cells/uL Final  . Neutrophils Relative % 02/13/2016 62  % Final  . Lymphocytes Relative 02/13/2016 28  % Final  . Monocytes Relative 02/13/2016 9  % Final  . Eosinophils Relative 02/13/2016 1  % Final  . Basophils Relative 02/13/2016 0  % Final  . Smear Review 02/13/2016 Criteria for review not met   Final  Appointment on 01/19/2016  Component Date Value Ref  Range Status  . WBC 01/19/2016 16.9* 4.0 - 10.5 K/uL Final  . RBC 01/19/2016 4.78  4.22 - 5.81 MIL/uL Final  . Hemoglobin 01/19/2016 12.4* 13.0 - 17.0 g/dL Final  . HCT 01/19/2016 38.6* 39.0 - 52.0 % Final  . MCV 01/19/2016 80.8  78.0 - 100.0 fL Final  . MCH 01/19/2016 25.9* 26.0 - 34.0 pg Final  . MCHC 01/19/2016 32.1  30.0 - 36.0 g/dL Final  . RDW 01/19/2016 15.7* 11.5 - 15.5 % Final  . Platelets 01/19/2016 349  150 - 400 K/uL Final  . Neutrophils Relative % 01/19/2016 67  % Final  . Neutro Abs 01/19/2016 11.3* 1.7 - 7.7 K/uL Final  . Lymphocytes Relative 01/19/2016 23  % Final  . Lymphs Abs 01/19/2016 3.9  0.7 - 4.0 K/uL Final  . Monocytes Relative 01/19/2016 8  % Final  . Monocytes Absolute 01/19/2016 1.3* 0.1 - 1.0 K/uL Final  . Eosinophils Relative 01/19/2016 2  % Final  . Eosinophils Absolute 01/19/2016 0.3  0.0 - 0.7 K/uL Final  . Basophils Relative 01/19/2016 0  % Final  . Basophils Absolute 01/19/2016 0.1  0.0 - 0.1 K/uL Final  . Sodium 01/19/2016 136  135 - 145 mmol/L Final  . Potassium 01/19/2016 4.0  3.5 - 5.1 mmol/L Final  . Chloride 01/19/2016 102  101 - 111 mmol/L Final  . CO2 01/19/2016 28  22 - 32 mmol/L Final  . Glucose, Bld 01/19/2016 109* 65 - 99 mg/dL Final  . BUN 01/19/2016 10  6 - 20 mg/dL Final  . Creatinine, Ser 01/19/2016 0.66  0.61 - 1.24 mg/dL Final  . Calcium 01/19/2016 9.3  8.9 - 10.3 mg/dL Final  . Total Protein 01/19/2016 7.4  6.5 - 8.1 g/dL Final  . Albumin 01/19/2016 3.0* 3.5 - 5.0 g/dL Final  . AST  01/19/2016 27  15 - 41 U/L Final  . ALT 01/19/2016 25  17 - 63 U/L Final  . Alkaline Phosphatase 01/19/2016 72  38 - 126 U/L Final  . Total Bilirubin 01/19/2016 0.4  0.3 - 1.2 mg/dL Final  . GFR calc non Af Amer 01/19/2016 >60  >60 mL/min Final  . GFR calc Af Amer 01/19/2016 >60  >60 mL/min Final   Comment: (NOTE) The eGFR has been calculated using the CKD EPI equation. This calculation has not been validated in all clinical situations. eGFR's persistently <60 mL/min signify possible Chronic Kidney Disease.   . Anion gap 01/19/2016 6  5 - 15 Final  . Ferritin 01/19/2016 245  24 - 336 ng/mL Final  . Iron 01/19/2016 19* 45 - 182 ug/dL Final  . TIBC 01/19/2016 202* 250 - 450 ug/dL Final  . Saturation Ratios 01/19/2016 9* 17.9 - 39.5 % Final  . UIBC 01/19/2016 183  ug/dL Final  . Folate 01/19/2016 16.0  >5.9 ng/mL Final  . Vitamin B-12 01/19/2016 885  180 - 914 pg/mL Final   Comment: (NOTE) This assay is not validated for testing neonatal or myeloproliferative syndrome specimens for Vitamin B12 levels. Performed at [facility]   Appointment on 12/13/2015  Component Date Value Ref Range Status  . FVC-Pre 12/13/2015 2.41  L Preliminary  . FVC-%Pred-Pre 12/13/2015 68  % Preliminary  . FVC-Post 12/13/2015 2.53  L Preliminary  . FVC-%Pred-Post 12/13/2015 71  % Preliminary  . FVC-%Change-Post 12/13/2015 4  % Preliminary  . FEV1-Pre 12/13/2015 1.85  L Preliminary  . FEV1-%Pred-Pre 12/13/2015 68  % Preliminary  . FEV1-Post 12/13/2015 2.07  L Preliminary  .  FEV1-%Pred-Post 12/13/2015 76  % Preliminary  . FEV1-%Change-Post 12/13/2015 11  % Preliminary  . FEV6-Pre 12/13/2015 2.40  L Preliminary  . FEV6-%Pred-Pre 12/13/2015 70  % Preliminary  . FEV6-Post 12/13/2015 2.53  L Preliminary  . FEV6-%Pred-Post 12/13/2015 74  % Preliminary  . FEV6-%Change-Post 12/13/2015 5  % Preliminary  . Pre FEV1/FVC ratio 12/13/2015 77  % Preliminary  . FEV1FVC-%Pred-Pre 12/13/2015 98  % Preliminary    . Post FEV1/FVC ratio 12/13/2015 82  % Preliminary  . FEV1FVC-%Change-Post 12/13/2015 6  % Preliminary  . Pre FEV6/FVC Ratio 12/13/2015 100  % Preliminary  . FEV6FVC-%Pred-Pre 12/13/2015 104  % Preliminary  . Post FEV6/FVC ratio 12/13/2015 100  % Preliminary  . FEV6FVC-%Pred-Post 12/13/2015 104  % Preliminary  . FEF 25-75 Pre 12/13/2015 1.46  L/sec Preliminary  . FEF2575-%Pred-Pre 12/13/2015 57  % Preliminary  . FEF 25-75 Post 12/13/2015 2.39  L/sec Preliminary  . FEF2575-%Pred-Post 12/13/2015 94  % Preliminary  . FEF2575-%Change-Post 12/13/2015 63  % Preliminary  . RV 12/13/2015 1.57  L Preliminary  . RV % pred 12/13/2015 73  % Preliminary  . TLC 12/13/2015 4.05  L Preliminary  . TLC % pred 12/13/2015 63  % Preliminary  . DLCO unc 12/13/2015 12.74  ml/min/mmHg Preliminary  . DLCO unc % pred 12/13/2015 45  % Preliminary  . DLCO cor 12/13/2015 15.01  ml/min/mmHg Preliminary  . DLCO cor % pred 12/13/2015 53  % Preliminary  . DL/VA 12/13/2015 4.07  ml/min/mmHg/L Preliminary  . DL/VA % pred 12/13/2015 92  % Preliminary      Imaging: No results found.  Speciality Comments: No specialty comments available.    Procedures:  No procedures performed Allergies: Patient has no known allergies.   Assessment / Plan:     Visit Diagnoses: Psoriatic arthritis ([place]) - Plan: Secukinumab SOAJ 150 mg  Psoriasis  High risk medication use - Imuran -'100MG'$  QDCONSENTYX ==> 1ST LOADING DOSE TODAY  Pulmonary fibrosis ([facility])  Pain in joint involving multiple sites - 2/23 2018: Flare to left elbow, right wrist and hand, bilateral feet, neck stiffness.  Other iron deficiency anemia - Plan: azaTHIOprine (IMURAN) 50 MG tablet   Plan: #1: Psoriatic arthritis and psoriasis. Patient is currently flaring. He has been off of [place] for about 2.5 weeks now. He has a flare of psoriasis to the face and around his beard. He has left elbow flare. Hurting him His right wrist and right hand is swollen and  painful and has decreased range of motion  #2: Patient has brought in labs that were done on 04/15/2016. All of those labs are within normal limits. CMP with GFR within normal limits CBC with differential within normal limits except for elevated white blood cell count at 16.7, increase RDW at 18; increased neutrophils at 10.3 (we will monitor; consistent with previous labs). Cholesterol panel within normal limits except for slight decrease in HDL at 37 Hemoglobin A1c pending Iron panel normal except for slightly low TIBC at 192 and this is consistent with past labs but slightly improved). We will scan these labs into patient's chart  #3: Prednisone taper ===>  Plan: 4po qAM x 5 days, 3po qAM x 5 days, 2po qAM x 5 days, 1po qAM x 5 days, 1/2po qAM x 5 days, then stop.  #4: Dr. Koleen Nimrod has planned to follow with patient over the phone to make sure that his medications are being used properly and he is getting the relief that we're hoping for. Summary: He will  have a Cosentyx loading dose (5 injections) and then monthly Cosentyx injections. He'll be on Imuran 100 mg daily. I gave her 30 day supply with a refill He also is out of prednisone and wants a refill on prednisone taper and I'm agreeable.  Orders: No orders of the defined types were placed in this encounter.  Meds ordered this encounter  Medications  . Secukinumab SOAJ 150 mg  . predniSONE (DELTASONE) 5 MG tablet    Sig: 4po qAM x 5 days, 3po qAM x 5 days, 2po qAM x 5 days, 1po qAM x 5 days, 1/2po qAM x 5 days, then stop.    Dispense:  53 tablet    Refill:  0    Order Specific Question:   Supervising Provider    Answer:   Bo Merino [[id number]]  . azaTHIOprine (IMURAN) 50 MG tablet    Sig: Take 1 tablet (50 mg total) by mouth 2 (two) times daily.    Dispense:  60 tablet    Refill:  2    Order Specific Question:   Supervising Provider    Answer:   Bo Merino [phone]    Face-to-face time spent with patient was 40  minutes. 50% of time was spent in counseling and coordination of care.  Follow-Up Instructions: Return in about 2 months (around 06/24/2016) for PsA flare,Ps flare,New Start Cosentyx,lt elbow pain, rt wrist & hand pain, pred taper.   Eliezer Lofts, PA-C  Note - This record has been created using [employer].  Chart creation errors have been sought, but may not always  have been located. Such creation errors do not reflect on  the standard of medical care.

## 2016-04-26 ENCOUNTER — Ambulatory Visit (INDEPENDENT_AMBULATORY_CARE_PROVIDER_SITE_OTHER): Payer: Medicare Other | Admitting: Rheumatology

## 2016-04-26 ENCOUNTER — Encounter: Payer: Self-pay | Admitting: Rheumatology

## 2016-04-26 VITALS — BP 117/57 | HR 53 | Resp 16 | Ht 67.0 in | Wt 170.0 lb

## 2016-04-26 DIAGNOSIS — D508 Other iron deficiency anemias: Secondary | ICD-10-CM

## 2016-04-26 DIAGNOSIS — M2550 Pain in unspecified joint: Secondary | ICD-10-CM

## 2016-04-26 DIAGNOSIS — Z79899 Other long term (current) drug therapy: Secondary | ICD-10-CM | POA: Diagnosis not present

## 2016-04-26 DIAGNOSIS — J8410 Pulmonary fibrosis, unspecified: Secondary | ICD-10-CM

## 2016-04-26 DIAGNOSIS — L409 Psoriasis, unspecified: Secondary | ICD-10-CM

## 2016-04-26 DIAGNOSIS — L4050 Arthropathic psoriasis, unspecified: Secondary | ICD-10-CM | POA: Diagnosis not present

## 2016-04-26 MED ORDER — PREDNISONE 5 MG PO TABS: ORAL_TABLET | ORAL | 0 refills | Status: DC

## 2016-04-26 MED ORDER — SECUKINUMAB 150 MG/ML ~~LOC~~ SOAJ
150.0000 mg | Freq: Once | SUBCUTANEOUS | Status: AC
  Administered 2016-04-26: 150 mg via SUBCUTANEOUS

## 2016-04-26 MED ORDER — AZATHIOPRINE 50 MG PO TABS: 50.0000 mg | ORAL_TABLET | Freq: Two times a day (BID) | ORAL | 2 refills | Status: DC

## 2016-04-26 NOTE — Patient Instructions (Addendum)
Take Cosentyx 300 mg weekly for 5 weeks, then every four weeks.    Calendar for the loading dose:   04/26/16: Week 1 of loading dose 05/03/16: Week 2 of loading dose 05/10/16: Week 3 of loading dose 05/17/16: Week 4 of loading dose 05/24/16: Week 5 of loading dose  Then take Cosentyx 300 mg every 4 weeks starting on 06/21/16.    Please call [employer] Patient Assistance Program at [phone] to refill your Cosentyx.  You will need to call weekly during the loading dose then every month.  Always call one week before your injection is due in order to allow time for the medication to be delivered.  Store your Cosentyx in the refrigerator until you are ready to use it.  Take it out of the refrigerator 15 minutes before you inject the medication.    Standing Labs We placed an order today for your standing lab work.    Please come back and get your standing labs in 1 month (around 05/24/16)  We have open lab Monday through Friday from 8:30-11:30 AM and 1:30-4 PM at the office of Dr. Tresa Moore, PA.   The office is located at [address] [place], [place], [place] [address] No appointment is necessary.   Labs are drawn by [facility].  You may receive a bill from [facility] for your lab work.      Secukinumab injection What is this medicine? SECUKINUMAB (sek ue KIN ue mab) is used to treat psoriasis. It is also used to treat psoriatic arthritis and ankylosing spondylitis. COMMON BRAND NAME(S): Cosentyx What should I tell my health care provider before I take this medicine? They need to know if you have any of these conditions: -Crohn's disease, ulcerative colitis, or other inflammatory bowel disease -infection or history of infection -other conditions affecting the immune system -recently received or are scheduled to receive a vaccine -tuberculosis, a positive skin test for tuberculosis, or have recently been in close contact with someone who has tuberculosis -an unusual or  allergic reaction to secukinumab, other medicines, latex, rubber, foods, dyes, or preservatives -pregnant or trying to get pregnant -breast-feeding How should I use this medicine? This medicine is for injection under the skin. It may be administered by a healthcare professional in a hospital or clinic setting or at home. If you get this medicine at home, you will be taught how to prepare and give this medicine. Use exactly as directed. Take your medicine at regular intervals. Do not take your medicine more often than directed. It is important that you put your used needles and syringes in a special sharps container. Do not put them in a trash can. If you do not have a sharps container, call your pharmacist or healthcare provider to get one. A special MedGuide will be given to you by the pharmacist with each prescription and refill. Be sure to read this information carefully each time. Talk to your pediatrician regarding the use of this medicine in children. Special care may be needed. What if I miss a dose? It is important not to miss your dose. Call your doctor of health care professional if you are unable to keep an appointment. If you give yourself the medicine and you miss a dose, take it as soon as you can. If it is almost time for your next dose, take only that dose. Do not take double or extra doses. What may interact with this medicine? Do not take this medicine with any of the following  medications: -live virus vaccines This medicine may also interact with the following medications: -cyclosporine -inactivated vaccines -warfarin What should I watch for while using this medicine? Tell your doctor or healthcare professional if your symptoms do not start to get better or if they get worse. You will be tested for tuberculosis (TB) before you start this medicine. If your doctor prescribes any medicine for TB, you should start taking the TB medicine before starting this medicine. Make sure to  finish the full course of TB medicine. Call your doctor or healthcare professional for advice if you get a fever, chills or sore throat, or other symptoms of a cold or flu. Do not treat yourself. This drug decreases your body's ability to fight infections. Try to avoid being around people who are sick. This medicine can decrease the response to a vaccine. If you need to get vaccinated, tell your healthcare professional if you have received this medicine within the last 6 months. Extra booster doses may be needed. Talk to your doctor to see if a different vaccination schedule is needed. What side effects may I notice from receiving this medicine? Side effects that you should report to your doctor or health care professional as soon as possible: -allergic reactions like skin rash, itching or hives, swelling of the face, lips, or tongue -signs and symptoms of infection like fever or chills; cough; sore throat; pain or trouble passing urine Side effects that usually do not require medical attention (report to your doctor or health care professional if they continue or are bothersome): -diarrhea Where should I keep my medicine? Keep out of the reach of children. Store the prefilled syringe or injection pen in a refrigerator between 2 to 8 degrees C (36 to 46 degrees F). Keep the syringe or the pen in the original carton until ready for use. Protect from light. Do not freeze. Do not shake. Prior to use, remove the syringe or pen from the refrigerator and use within 1 hour. Throw away any unused medicine after the expiration date on the label.  2017 Elsevier/Gold Standard (2015-03-23 11:48:31)

## 2016-04-26 NOTE — Progress Notes (Signed)
Pharmacy Note  Subjective:   Patient is being initiated on Cosentyx.  Patient was previously counseled extensively on Cosentyx on 02/16/16 and consented to initiation of Cosentyx at that time.  Patient presents to clinic today to receive the first dose of Cosentyx.  He confirms he has been off [place] for at least two weeks.       Objective: CMP     Component Value Date/Time   NA 140 02/13/2016 1138   K 4.5 02/13/2016 1138   CL 100 02/13/2016 1138   CO2 30 02/13/2016 1138   GLUCOSE 90 02/13/2016 1138   BUN 14 02/13/2016 1138   CREATININE 0.73 02/13/2016 1138   CALCIUM 9.3 02/13/2016 1138   PROT 6.9 02/13/2016 1138   ALBUMIN 3.2 (L) 02/13/2016 1138   AST 23 02/13/2016 1138   ALT 19 02/13/2016 1138   ALKPHOS 75 02/13/2016 1138   BILITOT 0.5 02/13/2016 1138   GFRNONAA >89 02/13/2016 1138   GFRAA >89 02/13/2016 1138    CBC    Component Value Date/Time   WBC 14.9 (H) 02/13/2016 1138   RBC 4.71 02/13/2016 1138   HGB 11.8 (L) 02/13/2016 1138   HGB 14.5 08/12/2011 1256   HCT 36.8 (L) 02/13/2016 1138   HCT 44.7 08/12/2011 1256   PLT 420 (H) 02/13/2016 1138   PLT 240 08/12/2011 1256   MCV 78.1 (L) 02/13/2016 1138   MCV 82.2 08/12/2011 1256   MCH 25.1 (L) 02/13/2016 1138   MCHC 32.1 02/13/2016 1138   RDW 15.4 (H) 02/13/2016 1138   RDW 14.7 (H) 08/12/2011 1256   LYMPHSABS 4,172 (H) 02/13/2016 1138   LYMPHSABS 5.5 (H) 08/12/2011 1256   MONOABS 1,341 (H) 02/13/2016 1138   MONOABS 1.6 (H) 08/12/2011 1256   EOSABS 149 02/13/2016 1138   EOSABS 0.2 08/12/2011 1256   BASOSABS 0 02/13/2016 1138   BASOSABS 0.1 08/12/2011 1256    TB Gold: negative (09/26/15)  Assessment/Plan:  Reviewed purpose, proper use, and adverse effects of Cosentyx.  Patient was counseled on how to administer subcutaneous Cosentyx injection using a demonstration pen.  I counseled patient on how to order Cosentyx refills from the [employer] patient assistance program.  Provided patient with schedule for Cosentyx  loading doses then maintenance dosing.  Patient was monitored for 30 minutes post injection.  No injection site reaction noted.  Patient will need standing lab orders in one month.  Provided patient with standing lab instructions and placed standing lab order.  Patient denies any questions or concerns regarding his medications at this time.    Elisabeth Most, Pharm.D., BCPS Clinical Pharmacist Pager: [phone] Phone: [phone] 04/26/2016 12:38 PM

## 2016-05-06 ENCOUNTER — Telehealth: Payer: Self-pay | Admitting: Pharmacist

## 2016-05-06 NOTE — Telephone Encounter (Signed)
Patient was initiated on Cosentyx on 04/26/16.  He was given dosing schedule to take Cosentyx 300 mg every week for 5 weeks (loading dose) then 300 mg every 4 weeks after completing the loading dose.  Patient was also given instructions on how to order Cosentyx from the [employer] patient assistance program.   I called Mr. Lillquist to follow up.  Patient's wife answered the phone and reports Mr. Wilder is doing good.  She confirms Mr. Feser took his second loading dose of Cosentyx 300 mg on Friday, 05/03/16.  She confirms they have already ordered his Cosentyx to be delivered this week for his third loading dose on Friday, 05/10/16.  She denies any questions or concerns regarding Cosentyx at this time.     Elisabeth Most, Pharm.D., BCPS, CPP Clinical Pharmacist Pager: [phone] Phone: [phone] 05/06/2016 10:34 AM

## 2016-05-20 ENCOUNTER — Other Ambulatory Visit: Payer: Self-pay | Admitting: Cardiovascular Disease

## 2016-05-20 ENCOUNTER — Telehealth: Payer: Self-pay | Admitting: Rheumatology

## 2016-05-20 ENCOUNTER — Telehealth: Payer: Self-pay | Admitting: Pharmacist

## 2016-05-20 ENCOUNTER — Other Ambulatory Visit: Payer: Self-pay | Admitting: *Deleted

## 2016-05-20 DIAGNOSIS — Z79899 Other long term (current) drug therapy: Secondary | ICD-10-CM | POA: Diagnosis not present

## 2016-05-20 LAB — CBC WITH DIFFERENTIAL/PLATELET
BASOS PCT: 0 %
Basophils Absolute: 0 cells/uL (ref 0–200)
EOS PCT: 2 %
Eosinophils Absolute: 300 cells/uL (ref 15–500)
HEMATOCRIT: 44.3 % (ref 38.5–50.0)
Hemoglobin: 14.1 g/dL (ref 13.2–17.1)
LYMPHS PCT: 26 %
Lymphs Abs: 3900 cells/uL (ref 850–3900)
MCH: 25.5 pg — ABNORMAL LOW (ref 27.0–33.0)
MCHC: 31.8 g/dL — AB (ref 32.0–36.0)
MCV: 80.3 fL (ref 80.0–100.0)
MONOS PCT: 11 %
Monocytes Absolute: 1650 cells/uL — ABNORMAL HIGH (ref 200–950)
NEUTROS PCT: 61 %
Neutro Abs: 9150 cells/uL — ABNORMAL HIGH (ref 1500–7800)
PLATELETS: 221 10*3/uL (ref 140–400)
RBC: 5.52 MIL/uL (ref 4.20–5.80)
RDW: 18.2 % — AB (ref 11.0–15.0)
WBC: 15 10*3/uL — AB (ref 3.8–10.8)

## 2016-05-20 LAB — COMPLETE METABOLIC PANEL WITH GFR
ALT: 21 U/L (ref 9–46)
AST: 20 U/L (ref 10–35)
Albumin: 3.6 g/dL (ref 3.6–5.1)
Alkaline Phosphatase: 74 U/L (ref 40–115)
BILIRUBIN TOTAL: 0.6 mg/dL (ref 0.2–1.2)
BUN: 14 mg/dL (ref 7–25)
CO2: 30 mmol/L (ref 20–31)
CREATININE: 0.61 mg/dL — AB (ref 0.70–1.25)
Calcium: 9.4 mg/dL (ref 8.6–10.3)
Chloride: 102 mmol/L (ref 98–110)
GFR, Est African American: >89 mL/min (ref >=60)
GFR, Est Non African American: >89 mL/min (ref >=60)
GLUCOSE: 85 mg/dL (ref 65–99)
Potassium: 4.1 mmol/L (ref 3.5–5.3)
SODIUM: 139 mmol/L (ref 135–146)
TOTAL PROTEIN: 6.5 g/dL (ref 6.1–8.1)

## 2016-05-20 NOTE — Telephone Encounter (Signed)
Patient was initiated on Cosentyx on 04/26/16.  He was given dosing schedule to take Cosentyx 300 mg every week for 5 weeks (loading dose) then 300 mg every 4 weeks after completing the loading dose.    I called Curtis Flores to follow up.  Patient reports Cosentyx loading dose is going well.  He confirms he has taken 4 doses of the Cosentyx loading dose which is on schedule.  He confirms he has ordered the 5th and final dose of the loading dose to be delivered this week from the [employer] patient assistance program.  I reminded patient he will be due for standing labs this week (orders are in the computer).  Patient confirms he will go to the [facility] to get standing labs drawn this week.  Patient denies any questions or concerns regarding Cosentyx at this time.  I reminded patient that once he completes the Cosentyx loading dose, he will start the Cosentyx maintenance dose (300 mg every 4 weeks).  He will be due for the first maintenance dose on 06/21/16.  Advised patient to call [employer] patient assistance program for fill of the maintenance dose ~7 days before his dose is due.  Patient voiced understanding.   Noted patient was to return to clinic 2 months after his last visit.  He does not currently have an appointment scheduled.  I transferred patient to the front desk to schedule a follow up appointment.     Elisabeth Most, Pharm.D., BCPS, CPP Clinical Pharmacist Pager: [phone] Phone: [phone] 05/20/2016 8:39 AM

## 2016-05-20 NOTE — Telephone Encounter (Signed)
Labs have been released.

## 2016-05-20 NOTE — Telephone Encounter (Signed)
Patient is at [facility] in [place] right now and they are needing a new standing order for lab.  CB#[phone].  Thank you.

## 2016-05-23 ENCOUNTER — Other Ambulatory Visit: Payer: Self-pay | Admitting: Cardiovascular Disease

## 2016-05-24 NOTE — Telephone Encounter (Signed)
REFILL 

## 2016-05-29 ENCOUNTER — Other Ambulatory Visit: Payer: Self-pay | Admitting: Rheumatology

## 2016-05-29 NOTE — Telephone Encounter (Signed)
Patient's wife [name] calling to let you know that she has the following information re the Pharmacy.   [employer] Patient Assistant Program   [phone]  She is worried.  Please let her know if there is something she is supposed to be doing with the Rx.

## 2016-05-29 NOTE — Telephone Encounter (Signed)
Spoke with [name] regarding patient's Cosentyx. Patient is due for next injection on 06/21/16. [name] advised to [employer] on 06/14/16 so they can ship the medication to them. Also reminded her that Mr. [place] has an appointment on 06/25/16 in which he will be due for labs as well. [name] advised the injection for May will be due on 07/19/16 and she will need to call [employer] on 07/12/16. Spoke with with her about bringing a calender to the next appointment to help make the schedule more understandable for her and her husband. She states she will bring one with them .

## 2016-05-29 NOTE — Telephone Encounter (Signed)
Patient's wife is requesting refill of cosentyx to be sent to [facility] outpatient pharmacy. Patient is due on Friday for an injection.

## 2016-05-30 ENCOUNTER — Other Ambulatory Visit: Payer: Self-pay

## 2016-05-30 DIAGNOSIS — D509 Iron deficiency anemia, unspecified: Secondary | ICD-10-CM

## 2016-06-04 ENCOUNTER — Telehealth: Payer: Self-pay | Admitting: Rheumatology

## 2016-06-04 NOTE — Telephone Encounter (Signed)
Patient's wife called wanting to know if you could call in a prescription of prednisone for his pain.  He is having some swelling in his feet that is causing some pain along with difficulty walking.  He has an appointment with his foot doctor tomorrow.  CB#[phone].  Please leave a message if she does not answer.  Thank you.

## 2016-06-04 NOTE — Telephone Encounter (Signed)
He uses [facility] in [place].

## 2016-06-05 DIAGNOSIS — M67371 Transient synovitis, right ankle and foot: Secondary | ICD-10-CM | POA: Diagnosis not present

## 2016-06-05 DIAGNOSIS — M19072 Primary osteoarthritis, left ankle and foot: Secondary | ICD-10-CM | POA: Diagnosis not present

## 2016-06-05 DIAGNOSIS — M67372 Transient synovitis, left ankle and foot: Secondary | ICD-10-CM | POA: Diagnosis not present

## 2016-06-05 DIAGNOSIS — M7752 Other enthesopathy of left foot: Secondary | ICD-10-CM | POA: Diagnosis not present

## 2016-06-05 DIAGNOSIS — M7751 Other enthesopathy of right foot: Secondary | ICD-10-CM | POA: Diagnosis not present

## 2016-06-05 DIAGNOSIS — M19071 Primary osteoarthritis, right ankle and foot: Secondary | ICD-10-CM | POA: Diagnosis not present

## 2016-06-05 DIAGNOSIS — G5790 Unspecified mononeuropathy of unspecified lower limb: Secondary | ICD-10-CM | POA: Diagnosis not present

## 2016-06-05 MED ORDER — PREDNISONE 5 MG PO TABS: ORAL_TABLET | ORAL | 0 refills | Status: DC

## 2016-06-05 NOTE — Addendum Note (Signed)
Addended by: Carole Binning on: 06/05/2016 04:04 PM   Modules accepted: Orders

## 2016-06-05 NOTE — Telephone Encounter (Signed)
Attempted to contact the patient and left message for patient to call the office.  

## 2016-06-05 NOTE — Telephone Encounter (Signed)
Patient's wife was seen by the foot doctor today. Patient was given cortisone injections in both feet. Patient's wife is request we send a prescription for prednisone because her husband is having pain and swelling in his feet which is causing difficulty walking.

## 2016-06-05 NOTE — Telephone Encounter (Signed)
I spoke with the patient's wife. She saw a foot doctor who gave her cortisone injection to the feet today.Patient is currently having a flare in which both of his hands and feet are swelling.Is requesting a prednisone taper.It is okay to give him the following taper:Prednisone taper ===>  Plan: 4po qAM x 5 days, 3po qAM x 5 days, 2po qAM x 5 days, 1po qAM x 5 days, 1/2po qAM x 5 days, then stop.

## 2016-06-06 ENCOUNTER — Other Ambulatory Visit: Payer: Self-pay | Admitting: Cardiovascular Disease

## 2016-06-12 DIAGNOSIS — M659 Synovitis and tenosynovitis, unspecified: Secondary | ICD-10-CM | POA: Diagnosis not present

## 2016-06-12 DIAGNOSIS — M7752 Other enthesopathy of left foot: Secondary | ICD-10-CM | POA: Diagnosis not present

## 2016-06-12 DIAGNOSIS — M7751 Other enthesopathy of right foot: Secondary | ICD-10-CM | POA: Diagnosis not present

## 2016-06-21 ENCOUNTER — Other Ambulatory Visit: Payer: Self-pay | Admitting: Rheumatology

## 2016-06-21 ENCOUNTER — Telehealth: Payer: Self-pay | Admitting: Rheumatology

## 2016-06-21 DIAGNOSIS — Z79899 Other long term (current) drug therapy: Secondary | ICD-10-CM

## 2016-06-21 LAB — CBC WITH DIFFERENTIAL/PLATELET
BASOS ABS: 0 {cells}/uL (ref 0–200)
BASOS PCT: 0 %
EOS PCT: 1 %
Eosinophils Absolute: 220 cells/uL (ref 15–500)
HEMATOCRIT: 42.1 % (ref 38.5–50.0)
HEMOGLOBIN: 13.5 g/dL (ref 13.2–17.1)
LYMPHS ABS: 3520 {cells}/uL (ref 850–3900)
Lymphocytes Relative: 16 %
MCH: 26.2 pg — AB (ref 27.0–33.0)
MCHC: 32.1 g/dL (ref 32.0–36.0)
MCV: 81.7 fL (ref 80.0–100.0)
MONO ABS: 1320 {cells}/uL — AB (ref 200–950)
Monocytes Relative: 6 %
NEUTROS ABS: 16940 {cells}/uL — AB (ref 1500–7800)
NEUTROS PCT: 77 %
Platelets: 249 10*3/uL (ref 140–400)
RBC: 5.15 MIL/uL (ref 4.20–5.80)
RDW: 17.2 % — ABNORMAL HIGH (ref 11.0–15.0)
WBC: 22 10*3/uL — ABNORMAL HIGH (ref 3.8–10.8)

## 2016-06-21 LAB — COMPLETE METABOLIC PANEL WITH GFR
ALBUMIN: 3.3 g/dL — AB (ref 3.6–5.1)
ALT: 20 U/L (ref 9–46)
AST: 21 U/L (ref 10–35)
Alkaline Phosphatase: 68 U/L (ref 40–115)
BUN: 13 mg/dL (ref 7–25)
CHLORIDE: 103 mmol/L (ref 98–110)
CO2: 28 mmol/L (ref 20–31)
CREATININE: 0.67 mg/dL — AB (ref 0.70–1.25)
Calcium: 9.3 mg/dL (ref 8.6–10.3)
Glucose, Bld: 148 mg/dL — ABNORMAL HIGH (ref 65–99)
Potassium: 4 mmol/L (ref 3.5–5.3)
SODIUM: 140 mmol/L (ref 135–146)
TOTAL PROTEIN: 6.2 g/dL (ref 6.1–8.1)
Total Bilirubin: 0.5 mg/dL (ref 0.2–1.2)

## 2016-06-21 NOTE — Telephone Encounter (Signed)
Ordered/ released standing orders

## 2016-06-21 NOTE — Telephone Encounter (Signed)
Patient is at [facility] in [place].  They need his orders released in the system or faxed.  [phone]

## 2016-06-24 ENCOUNTER — Encounter: Payer: Self-pay | Admitting: Rheumatology

## 2016-06-24 NOTE — Progress Notes (Signed)
C/w previous WBC. Please, fax labs to hematology

## 2016-06-24 NOTE — Progress Notes (Signed)
  1) History of elevated WBC's ==>  being monitored by dr. Whitney Muse, heme/onc. Next appt may 2018  See message below : Notes recorded by Eliezer Lofts, PA-C on 05/21/2016 at 8:59 AM EDT Please send copy of labs to PCP And Tell patient #1: CMP with GFR is normal #2: CBC with differential is within normal limits except his white count continues to be elevated as in the past labs and he has mild anemia. Patient has a history of elevated white count and he's already been worked up by hematology. Has seen Dr. Whitney Muse and prior to that, he saw Dr. Lamonte Sakai. Please forward labs to Dr. Whitney Muse (at [facility]) and Dr. Lamonte Sakai (at [facility])  2) Pt is on Imuran because of the following: Patient has interstitial lung disease with rales/crackles and Dr. Elsworth Soho wanted the patient to be on either Imuran or CellCept. Please see notes from October 23, 2015 for full details.  Jolee Ewing was too expensive and pt couldn't afford). (had failed Octavia )

## 2016-06-24 NOTE — Progress Notes (Deleted)
Office Visit Note  Patient: Curtis Flores             Date of Birth: 06-11-1955           MRN: [id number]             PCP: [name], MD Referring: Dione Housekeeper, MD Visit Date: 06/25/2016 Occupation: @GUAROCC @    Subjective:  No chief complaint on file.   History of Present Illness: Curtis Flores is a 61 y.o. male ***   Activities of Daily Living:  Patient reports morning stiffness for *** {minute/hour:19697}.   Patient {ACTIONS;DENIES/REPORTS:21021675::"Denies"} nocturnal pain.  Difficulty dressing/grooming: {ACTIONS;DENIES/REPORTS:21021675::"Denies"} Difficulty climbing stairs: {ACTIONS;DENIES/REPORTS:21021675::"Denies"} Difficulty getting out of chair: {ACTIONS;DENIES/REPORTS:21021675::"Denies"} Difficulty using hands for taps, buttons, cutlery, and/or writing: {ACTIONS;DENIES/REPORTS:21021675::"Denies"}   No Rheumatology ROS completed.   PMFS History:  Patient Active Problem List   Diagnosis Date Noted  . Pain in joint involving multiple sites 04/25/2016  . Psoriatic arthritis ([place]) 02/14/2016  . High risk medication use 02/14/2016  . NICM (nonischemic cardiomyopathy) ([place]) 08/29/2015  . Essential hypertension 08/29/2015  . Abnormal CT scan, stomach   . Hiatal hernia   . GERD (gastroesophageal reflux disease) 08/01/2015  . Weight loss, unintentional 08/01/2015  . Iron deficiency anemia 06/06/2015  . Heme positive stool 06/06/2015  . PAF (paroxysmal atrial fibrillation) ([place]) 05/30/2015  . CAD S/P percutaneous coronary angioplasty 04/07/2015  . Atrial flutter ([place]) 04/07/2015  . 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 Acute on chronic systolic congestive heart failure ([place]) 04/07/2015  . LBBB (left bundle branch block) 04/07/2015  . Wide-complex tachycardia ([place]) 04/07/2015  . Hyperlipidemia 03/07/2015  . Hypertension   . Psoriasis   . Rheumatoid arthritis ([place])   . Pulmonary fibrosis ([place]) 03/21/2011    Past Medical History:    Diagnosis Date  . Abnormal CT scan, stomach   . Chronic systolic dysfunction of left ventricle   . Coronary artery disease    lateral STEMI 02/22/15  . GERD (gastroesophageal reflux disease)   . Hyperlipidemia   . Hypertension   . Ischemic cardiomyopathy   . LBBB (left bundle branch block)   . Leukocytosis    followed by hematology, reactive  . Lymphadenopathy   . Myocardial infarction 02/2015  . Psoriasis 2003  . Psoriatic arthritis ([place])   . Pulmonary fibrosis ([place])   . Rheumatoid arthritis(714.0) 2012  . Typical atrial flutter (HCC)     Family History  Problem Relation Age of Onset  . Hypertension Mother   . Colon cancer Neg Hx    Past Surgical History:  Procedure Laterality Date  . CARDIAC CATHETERIZATION N/A 02/22/2015   Procedure: Left Heart Cath and Coronary Angiography;  Surgeon: Lorretta Harp, MD;  Location: [facility];  Service: Cardiovascular;  Laterality: N/A;  . CARDIAC CATHETERIZATION N/A 02/22/2015   Procedure: Coronary Stent Intervention;  Surgeon: Lorretta Harp, MD;  Location: [facility];  Service: Cardiovascular;  Laterality: N/A;  . CARDIOVERSION N/A 04/12/2015   Procedure: CARDIOVERSION;  Surgeon: Larey Dresser, MD;  Location: [facility] ENDOSCOPY;  Service: Cardiovascular;  Laterality: N/A;  . COLONOSCOPY  07/01/2003   GYF:VCBSWH colonic mucosa except for the proximal right colon in the area of ICV which was not seen completely due to inadequate bowel prep. followed with ACBE which was normal.   . COLONOSCOPY N/A 08/24/2015   Dr. Gala Romney: Normal colon. Next colonoscopy in 10 years.  [name] ELECTROPHYSIOLOGIC STUDY N/A 05/30/2015   Atrial fibrillation ablation by  Dr Rayann Heman  . ESOPHAGOGASTRODUODENOSCOPY N/A 08/24/2015   Dr. Gala Romney: Medium-sized hiatal hernia, erosive gastropathy. Cameron lesions. Esophageal mucosa distally suggestive of short segment Barrett's esophagus. Not confirmed on biopsy. Gastric biopsy with minimal chronic inflammation  . GIVENS  CAPSULE STUDY N/A 04/17/2016   Procedure: GIVENS CAPSULE STUDY;  Surgeon: Daneil Dolin, MD;  Location[facility];  Service: Endoscopy;  Laterality: N/A;  Pt to arrive at 8:00 am for 8:30 am appt  . TEE WITHOUT CARDIOVERSION N/A 04/12/2015   Procedure: TRANSESOPHAGEAL ECHOCARDIOGRAM (TEE);  Surgeon: Larey Dresser, MD;  Location: [facility] ENDOSCOPY;  Service: Cardiovascular;  Laterality: N/A;   Social History   Social History Narrative  . No narrative on file     Objective: Vital Signs: There were no vitals taken for this visit.   Physical Exam   Musculoskeletal Exam: ***  CDAI Exam: No CDAI exam completed.    Investigation: No additional findings.   TB Gold Neg 09/29/15  CBC    Component Value Date/Time   WBC 22.0 (H) 06/21/2016 1115   RBC 5.15 06/21/2016 1115   HGB 13.5 06/21/2016 1115   HGB 14.5 08/12/2011 1256   HCT 42.1 06/21/2016 1115   HCT 44.7 08/12/2011 1256   PLT 249 06/21/2016 1115   PLT 240 08/12/2011 1256   MCV 81.7 06/21/2016 1115   MCV 82.2 08/12/2011 1256   MCH 26.2 (L) 06/21/2016 1115   MCHC 32.1 06/21/2016 1115   RDW 17.2 (H) 06/21/2016 1115   RDW 14.7 (H) 08/12/2011 1256   LYMPHSABS 3,520 06/21/2016 1115   LYMPHSABS 5.5 (H) 08/12/2011 1256   MONOABS 1,320 (H) 06/21/2016 1115   MONOABS 1.6 (H) 08/12/2011 1256   EOSABS 220 06/21/2016 1115   EOSABS 0.2 08/12/2011 1256   BASOSABS 0 06/21/2016 1115   BASOSABS 0.1 08/12/2011 1256   CMP     Component Value Date/Time   NA 140 06/21/2016 1115   K 4.0 06/21/2016 1115   CL 103 06/21/2016 1115   CO2 28 06/21/2016 1115   GLUCOSE 148 (H) 06/21/2016 1115   BUN 13 06/21/2016 1115   CREATININE 0.67 (L) 06/21/2016 1115   CALCIUM 9.3 06/21/2016 1115   PROT 6.2 06/21/2016 1115   ALBUMIN 3.3 (L) 06/21/2016 1115   AST 21 06/21/2016 1115   ALT 20 06/21/2016 1115   ALKPHOS 68 06/21/2016 1115   BILITOT 0.5 06/21/2016 1115   GFRNONAA >89 06/21/2016 1115   GFRAA >89 06/21/2016 1115    Imaging: No  results found.  Speciality Comments: No specialty comments available.    Procedures:  No procedures performed Allergies: Patient has no known allergies.   Assessment / Plan:     Visit Diagnoses: Psoriatic arthritis ([place])  Psoriasis  Rheumatoid arthritis involving multiple sites with positive rheumatoid factor (HCC)  High risk medication use - Cosentyx inj., Imuran 50 mg BID, Soaj 150mg , s/p pred 35m dosepak 06/05/16  Pain in joint involving multiple sites  Other iron deficiency anemia    Orders: No orders of the defined types were placed in this encounter.  No orders of the defined types were placed in this encounter.   Face-to-face time spent with patient was *** minutes. 50% of time was spent in counseling and coordination of care.  Follow-Up Instructions: No Follow-up on file.   Albie Arizpe, RT  Note - This record has been created using [employer].  Chart creation errors have been sought, but Jazzy Parmer not always  have been located. Such creation errors do  not reflect on  the standard of medical care.

## 2016-06-25 ENCOUNTER — Ambulatory Visit: Payer: Medicare Other | Admitting: Rheumatology

## 2016-06-25 ENCOUNTER — Telehealth: Payer: Self-pay | Admitting: Radiology

## 2016-06-25 NOTE — Progress Notes (Signed)
Office Visit Note  Patient: Curtis Flores             Date of Birth: [birth year]           MRN: [id number]             PCP: [name], MD Referring: Dione Housekeeper, MD Visit Date: 06/26/2016 Occupation: _0 @    Subjective:  Follow-up   History of Present Illness: Curtis Flores is a 61 y.o. male  Last seen 04/26/2016  Patient was failing Otezla at the last visit and he was switched over to Cosentyx. He also takes Imuran 50 mg; 2 pills every day.  Patient reports that the medication (Cosentyx and Imuran) is working well for him.  He just recently finished her prednisone taper and it is possible that the prednisone's effect is what is making his joints feel good at this time. He'll be off of prednisone as of today and we will see over the next 2-3 weeks I will the patient responds to his dual therapy of Cosentyx and Imuran.  Note that he has felt Cosentyx Humira and Enbrel in the past.  We are also worried about his elevated white count lately. The white count has been elevated for multiple months now and we do not feel it's due to starting the Cosentyx (which was started February 2018).  Activities of Daily Living:  Patient reports morning stiffness for 15 minutes.   Patient Reports nocturnal pain.  Difficulty dressing/grooming: Denies Difficulty climbing stairs: Denies Difficulty getting out of chair: Denies Difficulty using hands for taps, buttons, cutlery, and/or writing: Denies   Review of Systems  Constitutional: Negative for fatigue.  HENT: Negative for mouth sores and mouth dryness.   Eyes: Negative for dryness.  Respiratory: Negative for shortness of breath.   Gastrointestinal: Negative for constipation and diarrhea.  Musculoskeletal: Negative for myalgias and myalgias.  Skin: Negative for sensitivity to sunlight.  Neurological: Negative for memory loss.  Psychiatric/Behavioral: Negative for sleep disturbance.    PMFS History:  Patient  Active Problem List   Diagnosis Date Noted  . Pain in joint involving multiple sites 04/25/2016  . Psoriatic arthritis ([place]) 02/14/2016  . High risk medication use 02/14/2016  . NICM (nonischemic cardiomyopathy) ([place]) 08/29/2015  . Essential hypertension 08/29/2015  . Abnormal CT scan, stomach   . Hiatal hernia   . GERD (gastroesophageal reflux disease) 08/01/2015  . Weight loss, unintentional 08/01/2015  . Iron deficiency anemia 06/06/2015  . Heme positive stool 06/06/2015  . PAF (paroxysmal atrial fibrillation) ([place]) 05/30/2015  . CAD S/P percutaneous coronary angioplasty 04/07/2015  . Atrial flutter ([place]) 04/07/2015  . 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 Acute on chronic systolic congestive heart failure ([place]) 04/07/2015  . LBBB (left bundle branch block) 04/07/2015  . Wide-complex tachycardia ([place]) 04/07/2015  . Hyperlipidemia 03/07/2015  . Hypertension   . Psoriasis   . Rheumatoid arthritis ([place])   . Pulmonary fibrosis ([place]) 03/21/2011    Past Medical History:  Diagnosis Date  . Abnormal CT scan, stomach   . Chronic systolic dysfunction of left ventricle   . Coronary artery disease    lateral STEMI 02/22/15  . GERD (gastroesophageal reflux disease)   . Hyperlipidemia   . Hypertension   . Ischemic cardiomyopathy   . LBBB (left bundle branch block)   . Leukocytosis    followed by hematology, reactive  . Lymphadenopathy   . Myocardial infarction ([place]) 02/2015  . Psoriasis 2003  . Psoriatic arthritis (  [place])   . Pulmonary fibrosis ([place])   . Rheumatoid arthritis(714.0) 2012  . Typical atrial flutter ([facility])     Family History  Problem Relation Age of Onset  . Hypertension Mother   . Colon cancer Neg Hx    Past Surgical History:  Procedure Laterality Date  . CARDIAC CATHETERIZATION N/A 02/22/2015   Procedure: Left Heart Cath and Coronary Angiography;  Surgeon: Lorretta Harp, MD;  Location: [facility];  Service: Cardiovascular;  Laterality:  N/A;  . CARDIAC CATHETERIZATION N/A 02/22/2015   Procedure: Coronary Stent Intervention;  Surgeon: Lorretta Harp, MD;  Location: [facility];  Service: Cardiovascular;  Laterality: N/A;  . CARDIOVERSION N/A 04/12/2015   Procedure: CARDIOVERSION;  Surgeon: Larey Dresser, MD;  Location: [facility] ENDOSCOPY;  Service: Cardiovascular;  Laterality: N/A;  . COLONOSCOPY  07/01/2003   [id number] colonic mucosa except for the proximal right colon in the area of ICV which was not seen completely due to inadequate bowel prep. followed with ACBE which was normal.   . COLONOSCOPY N/A 08/24/2015   Dr. Gala Romney: Normal colon. Next colonoscopy in 10 years.  [name] ELECTROPHYSIOLOGIC STUDY N/A 05/30/2015   Atrial fibrillation ablation by Dr Rayann Heman  . ESOPHAGOGASTRODUODENOSCOPY N/A 08/24/2015   Dr. Gala Romney: Medium-sized hiatal hernia, erosive gastropathy. Cameron lesions. Esophageal mucosa distally suggestive of short segment Barrett's esophagus. Not confirmed on biopsy. Gastric biopsy with minimal chronic inflammation  . GIVENS CAPSULE STUDY N/A 04/17/2016   Procedure: GIVENS CAPSULE STUDY;  Surgeon: Daneil Dolin, MD;  [facility];  Service: Endoscopy;  Laterality: N/A;  Pt to arrive at 8:00 am for 8:30 am appt  . TEE WITHOUT CARDIOVERSION N/A 04/12/2015   Procedure: TRANSESOPHAGEAL ECHOCARDIOGRAM (TEE);  Surgeon: Larey Dresser, MD;  Location: [facility] ENDOSCOPY;  Service: Cardiovascular;  Laterality: N/A;   Social History   Social History Narrative  . No narrative on file     Objective: Vital Signs: BP (!) 150/63 (BP Location: Left Arm, Patient Position: Sitting, Cuff Size: Normal)   Pulse (!) 56   Resp 13   Ht 5' 7" (1.702 m)   Wt 180 lb (81.6 kg)   BMI 28.19 kg/m    Physical Exam  Constitutional: He is oriented to person, place, and time. He appears well-developed and well-nourished.  HENT:  Head: Normocephalic and atraumatic.  Eyes: Conjunctivae and EOM are normal. Pupils are equal, round, and  reactive to light.  Neck: Normal range of motion. Neck supple.  Cardiovascular: Normal rate, regular rhythm and normal heart sounds.  Exam reveals no gallop and no friction rub.   No murmur heard. Pulmonary/Chest: Effort normal. No respiratory distress. He has no wheezes. He has rales in the left lower field.     He exhibits no tenderness.  Abdominal: Soft. He exhibits no distension and no mass. There is no tenderness. There is no guarding.  Musculoskeletal: Normal range of motion.  Lymphadenopathy:    He has no cervical adenopathy.  Neurological: He is alert and oriented to person, place, and time. He exhibits normal muscle tone. Coordination normal.  Skin: Skin is warm and dry. Capillary refill takes less than 2 seconds. No rash noted.  Psychiatric: He has a normal mood and affect. His behavior is normal. Judgment and thought content normal.  Vitals reviewed.    Musculoskeletal Exam:  Full range of motion of all joints Grip strength is slightly decreased bilaterally with about 95% fist formation Fiber myalgia tender points are all absent  CDAI Exam: CDAI Homunculus Exam:   Tenderness:  Right hand: 2nd MCP Left hand: 2nd MCP and 3rd MCP  Joint Counts:  CDAI Tender Joint count: 3 CDAI Swollen Joint count: 0   Patient does not have any synovitis; he does have synovial thickening in the right second MCP joint; left second and third MCP joint. Mild tenderness to palpation there.  Overall the patient's examination is better on this visit than in the previous visits.  Investigation: No additional findings.  Lab on 06/21/2016  Component Date Value Ref Range Status  . WBC 06/21/2016 22.0* 3.8 - 10.8 K/uL Final  . RBC 06/21/2016 5.15  4.20 - 5.80 MIL/uL Final  . Hemoglobin 06/21/2016 13.5  13.2 - 17.1 g/dL Final  . HCT 06/21/2016 42.1  38.5 - 50.0 % Final  . MCV 06/21/2016 81.7  80.0 - 100.0 fL Final  . MCH 06/21/2016 26.2* 27.0 - 33.0 pg Final  . MCHC 06/21/2016 32.1   32.0 - 36.0 g/dL Final  . RDW 06/21/2016 17.2* 11.0 - 15.0 % Final  . Platelets 06/21/2016 249  140 - 400 K/uL Final  . Neutro Abs 06/21/2016 16940* 1,500 - 7,800 cells/uL Final  . Lymphs Abs 06/21/2016 3520  850 - 3,900 cells/uL Final  . Monocytes Absolute 06/21/2016 1320* 200 - 950 cells/uL Final  . Eosinophils Absolute 06/21/2016 220  15 - 500 cells/uL Final  . Basophils Absolute 06/21/2016 0  0 - 200 cells/uL Final  . Neutrophils Relative % 06/21/2016 77  % Final  . Lymphocytes Relative 06/21/2016 16  % Final  . Monocytes Relative 06/21/2016 6  % Final  . Eosinophils Relative 06/21/2016 1  % Final  . Basophils Relative 06/21/2016 0  % Final  . Smear Review 06/21/2016 Criteria for review not met   Final  . Sodium 06/21/2016 140  135 - 146 mmol/L Final  . Potassium 06/21/2016 4.0  3.5 - 5.3 mmol/L Final  . Chloride 06/21/2016 103  98 - 110 mmol/L Final  . CO2 06/21/2016 28  20 - 31 mmol/L Final  . Glucose, Bld 06/21/2016 148* 65 - 99 mg/dL Final  . BUN 06/21/2016 13  7 - 25 mg/dL Final  . Creat 06/21/2016 0.67* 0.70 - 1.25 mg/dL Final   Comment:   For patients > or = 63 years of age: The upper reference limit for Creatinine is approximately 13% higher for people identified as African-American.     . Total Bilirubin 06/21/2016 0.5  0.2 - 1.2 mg/dL Final  . Alkaline Phosphatase 06/21/2016 68  40 - 115 U/L Final  . AST 06/21/2016 21  10 - 35 U/L Final  . ALT 06/21/2016 20  9 - 46 U/L Final  . Total Protein 06/21/2016 6.2  6.1 - 8.1 g/dL Final  . Albumin 06/21/2016 3.3* 3.6 - 5.1 g/dL Final  . Calcium 06/21/2016 9.3  8.6 - 10.3 mg/dL Final  . GFR, Est African American 06/21/2016 >89  >=60 mL/min Final  . GFR, Est Non African American 06/21/2016 >89  >=60 mL/min Final  Orders Only on 05/20/2016  Component Date Value Ref Range Status  . WBC 05/20/2016 15.0* 3.8 - 10.8 K/uL Final  . RBC 05/20/2016 5.52  4.20 - 5.80 MIL/uL Final  . Hemoglobin 05/20/2016 14.1  13.2 - 17.1 g/dL Final    . HCT 05/20/2016 44.3  38.5 - 50.0 % Final  . MCV 05/20/2016 80.3  80.0 - 100.0 fL Final  . MCH 05/20/2016 25.5* 27.0 - 33.0 pg Final  .  MCHC 05/20/2016 31.8* 32.0 - 36.0 g/dL Final  . RDW 05/20/2016 18.2* 11.0 - 15.0 % Final  . Platelets 05/20/2016 221  140 - 400 K/uL Final  . Neutro Abs 05/20/2016 9150* 1,500 - 7,800 cells/uL Final  . Lymphs Abs 05/20/2016 3900  850 - 3,900 cells/uL Final  . Monocytes Absolute 05/20/2016 1650* 200 - 950 cells/uL Final  . Eosinophils Absolute 05/20/2016 300  15 - 500 cells/uL Final  . Basophils Absolute 05/20/2016 0  0 - 200 cells/uL Final  . Neutrophils Relative % 05/20/2016 61  % Final  . Lymphocytes Relative 05/20/2016 26  % Final  . Monocytes Relative 05/20/2016 11  % Final  . Eosinophils Relative 05/20/2016 2  % Final  . Basophils Relative 05/20/2016 0  % Final  . Smear Review 05/20/2016 Criteria for review not met   Final  . Sodium 05/20/2016 139  135 - 146 mmol/L Final  . Potassium 05/20/2016 4.1  3.5 - 5.3 mmol/L Final  . Chloride 05/20/2016 102  98 - 110 mmol/L Final  . CO2 05/20/2016 30  20 - 31 mmol/L Final  . Glucose, Bld 05/20/2016 85  65 - 99 mg/dL Final  . BUN 05/20/2016 14  7 - 25 mg/dL Final  . Creat 05/20/2016 0.61* 0.70 - 1.25 mg/dL Final   Comment:   For patients > or = 63 years of age: The upper reference limit for Creatinine is approximately 13% higher for people identified as African-American.     . Total Bilirubin 05/20/2016 0.6  0.2 - 1.2 mg/dL Final  . Alkaline Phosphatase 05/20/2016 74  40 - 115 U/L Final  . AST 05/20/2016 20  10 - 35 U/L Final  . ALT 05/20/2016 21  9 - 46 U/L Final  . Total Protein 05/20/2016 6.5  6.1 - 8.1 g/dL Final  . Albumin 05/20/2016 3.6  3.6 - 5.1 g/dL Final  . Calcium 05/20/2016 9.4  8.6 - 10.3 mg/dL Final  . GFR, Est African American 05/20/2016 >89  >=60 mL/min Final  . GFR, Est Non African American 05/20/2016 >89  >=60 mL/min Final  Clinical Support on 03/19/2016  Component Date Value  Ref Range Status  . IFOBT 03/19/2016 Positive   Final  Orders Only on 02/13/2016  Component Date Value Ref Range Status  . Sodium 02/13/2016 140  135 - 146 mmol/L Final  . Potassium 02/13/2016 4.5  3.5 - 5.3 mmol/L Final  . Chloride 02/13/2016 100  98 - 110 mmol/L Final  . CO2 02/13/2016 30  20 - 31 mmol/L Final  . Glucose, Bld 02/13/2016 90  65 - 99 mg/dL Final  . BUN 02/13/2016 14  7 - 25 mg/dL Final  . Creat 02/13/2016 0.73  0.70 - 1.25 mg/dL Final   Comment:   For patients > or = 64 years of age: The upper reference limit for Creatinine is approximately 13% higher for people identified as African-American.     . Total Bilirubin 02/13/2016 0.5  0.2 - 1.2 mg/dL Final  . Alkaline Phosphatase 02/13/2016 75  40 - 115 U/L Final  . AST 02/13/2016 23  10 - 35 U/L Final  . ALT 02/13/2016 19  9 - 46 U/L Final  . Total Protein 02/13/2016 6.9  6.1 - 8.1 g/dL Final  . Albumin 02/13/2016 3.2* 3.6 - 5.1 g/dL Final  . Calcium 02/13/2016 9.3  8.6 - 10.3 mg/dL Final  . GFR, Est African American 02/13/2016 >89  >=60 mL/min Final  . GFR, Est Non African  American 02/13/2016 >89  >=60 mL/min Final  . WBC 02/13/2016 14.9* 3.8 - 10.8 K/uL Final  . RBC 02/13/2016 4.71  4.20 - 5.80 MIL/uL Final  . Hemoglobin 02/13/2016 11.8* 13.2 - 17.1 g/dL Final  . HCT 02/13/2016 36.8* 38.5 - 50.0 % Final  . MCV 02/13/2016 78.1* 80.0 - 100.0 fL Final  . MCH 02/13/2016 25.1* 27.0 - 33.0 pg Final  . MCHC 02/13/2016 32.1  32.0 - 36.0 g/dL Final  . RDW 02/13/2016 15.4* 11.0 - 15.0 % Final  . Platelets 02/13/2016 420* 140 - 400 K/uL Final  . MPV 02/13/2016 10.4  7.5 - 12.5 fL Final  . Neutro Abs 02/13/2016 9238* 1,500 - 7,800 cells/uL Final  . Lymphs Abs 02/13/2016 4172* 850 - 3,900 cells/uL Final  . Monocytes Absolute 02/13/2016 1341* 200 - 950 cells/uL Final  . Eosinophils Absolute 02/13/2016 149  15 - 500 cells/uL Final  . Basophils Absolute 02/13/2016 0  0 - 200 cells/uL Final  . Neutrophils Relative % 02/13/2016  62  % Final  . Lymphocytes Relative 02/13/2016 28  % Final  . Monocytes Relative 02/13/2016 9  % Final  . Eosinophils Relative 02/13/2016 1  % Final  . Basophils Relative 02/13/2016 0  % Final  . Smear Review 02/13/2016 Criteria for review not met   Final  Appointment on 01/19/2016  Component Date Value Ref Range Status  . WBC 01/19/2016 16.9* 4.0 - 10.5 K/uL Final  . RBC 01/19/2016 4.78  4.22 - 5.81 MIL/uL Final  . Hemoglobin 01/19/2016 12.4* 13.0 - 17.0 g/dL Final  . HCT 01/19/2016 38.6* 39.0 - 52.0 % Final  . MCV 01/19/2016 80.8  78.0 - 100.0 fL Final  . MCH 01/19/2016 25.9* 26.0 - 34.0 pg Final  . MCHC 01/19/2016 32.1  30.0 - 36.0 g/dL Final  . RDW 01/19/2016 15.7* 11.5 - 15.5 % Final  . Platelets 01/19/2016 349  150 - 400 K/uL Final  . Neutrophils Relative % 01/19/2016 67  % Final  . Neutro Abs 01/19/2016 11.3* 1.7 - 7.7 K/uL Final  . Lymphocytes Relative 01/19/2016 23  % Final  . Lymphs Abs 01/19/2016 3.9  0.7 - 4.0 K/uL Final  . Monocytes Relative 01/19/2016 8  % Final  . Monocytes Absolute 01/19/2016 1.3* 0.1 - 1.0 K/uL Final  . Eosinophils Relative 01/19/2016 2  % Final  . Eosinophils Absolute 01/19/2016 0.3  0.0 - 0.7 K/uL Final  . Basophils Relative 01/19/2016 0  % Final  . Basophils Absolute 01/19/2016 0.1  0.0 - 0.1 K/uL Final  . Sodium 01/19/2016 136  135 - 145 mmol/L Final  . Potassium 01/19/2016 4.0  3.5 - 5.1 mmol/L Final  . Chloride 01/19/2016 102  101 - 111 mmol/L Final  . CO2 01/19/2016 28  22 - 32 mmol/L Final  . Glucose, Bld 01/19/2016 109* 65 - 99 mg/dL Final  . BUN 01/19/2016 10  6 - 20 mg/dL Final  . Creatinine, Ser 01/19/2016 0.66  0.61 - 1.24 mg/dL Final  . Calcium 01/19/2016 9.3  8.9 - 10.3 mg/dL Final  . Total Protein 01/19/2016 7.4  6.5 - 8.1 g/dL Final  . Albumin 01/19/2016 3.0* 3.5 - 5.0 g/dL Final  . AST 01/19/2016 27  15 - 41 U/L Final  . ALT 01/19/2016 25  17 - 63 U/L Final  . Alkaline Phosphatase 01/19/2016 72  38 - 126 U/L Final  . Total  Bilirubin 01/19/2016 0.4  0.3 - 1.2 mg/dL Final  . GFR calc non Af  Amer 01/19/2016 >60  >60 mL/min Final  . GFR calc Af Amer 01/19/2016 >60  >60 mL/min Final   Comment: (NOTE) The eGFR has been calculated using the CKD EPI equation. This calculation has not been validated in all clinical situations. eGFR's persistently <60 mL/min signify possible Chronic Kidney Disease.   . Anion gap 01/19/2016 6  5 - 15 Final  . Ferritin 01/19/2016 245  24 - 336 ng/mL Final  . Iron 01/19/2016 19* 45 - 182 ug/dL Final  . TIBC 01/19/2016 202* 250 - 450 ug/dL Final  . Saturation Ratios 01/19/2016 9* 17.9 - 39.5 % Final  . UIBC 01/19/2016 183  ug/dL Final  . Folate 01/19/2016 16.0  >5.9 ng/mL Final  . Vitamin B-12 01/19/2016 885  180 - 914 pg/mL Final   Comment: (NOTE) This assay is not validated for testing neonatal or myeloproliferative syndrome specimens for Vitamin B12 levels. Performed at [facility]      Imaging: No results found.  Speciality Comments: No specialty comments available.    Procedures:  No procedures performed Allergies: Patient has no known allergies.   Assessment / Plan:     Visit Diagnoses: Psoriatic arthritis ([place])  Psoriasis  Pulmonary fibrosis ([place]) - Hx: Interstitial Lung Disease// Dr. Elsworth Soho prefers Imuran over [facility] + Bactrim]  High risk medication use - 06/26/2016: Cosentyx started February 2018; Imuran 100 mg daily(Dr. Elsworth Soho Prefers).Failed: Otezla/Enbrel/Humira; too expensive: Arava;  Pain in joint involving multiple sites  Other elevated white blood cell (WBC) count  Rheumatoid arthritis involving multiple sites with positive rheumatoid factor (HCC)   Plan: #1: Psoriatic arthritis. Doing well with psoriatic arthritis at this visit. Note that he has failed Enbrel Otezla Humira in the past. In February 2018, we started him on Cosentyx and patient feels that he is doing well with the Cosentyx. In addition, he is also on Imuran 50 mg; 2 pills  every day. He feels that his joints are doing much better with this combination of medication Unfortunately, right now he is on prednisone taper and he finished taper as of today. I wonder if it the prednisone that's giving him the improvement that we see versus the Cosentyx and Imuran. In the next 2-3 weeks we will be able to assess how well the patient is doing since the prednisone course has been completed  #2: Psoriasis No flare. Doing well.   #3: Pulmonary fibrosis/interstitial lung disease. Being treated with Imuran at this time. 50 mg ;  2 times daily. Differential diagnosis includes sarcoidosis. Note that patient has history of perihilar lymphadenopathy in the past. TODAY ==> MILD RALES IN LEFT LOWER LUNG FIELDS.  #4: Elevated white count. Current white count is 22,000 noted on 06/21/2016 labs.  Notes recorded by Bo Merino, MD on 06/24/2016 at 1:09 PM EDT C/w previous WBC. Please, fax labs to hematology    Ref Range & Units 5d ago 14moago 4181mogo 81m56moo   WBC 3.8 - 10.8 K/uL 22.0   15.0   14.9   16.9R     RBC 4.20 - 5.80 MIL/uL 5.15  5.52  4.71  4.78R    Hemoglobin 13.2 - 17.1 g/dL 13.5  14.1  11.8   12.4R     HCT 38.5 - 50.0 % 42.1  44.3  36.8   38.6R     MCV 80.0 - 100.0 fL 81.7  80.3  78.1   80.8R    MCH 27.0 - 33.0 pg 26.2   25.5   25.1  25.9R     MCHC 32.0 - 36.0 g/dL 32.1  31.8   32.1  32.1R    RDW 11.0 - 15.0 % 17.2   18.2   15.4   15.7R     Platelets 140 - 400 K/uL 249  221  420   349R    Neutro Abs 1,500 - 7,800 cells/uL 16,940   9,150   9,238   11.3R     Lymphs Abs 850 - 3,900 cells/uL 3,520  3,900  4,172   3.9R    Monocytes Absolute 200 - 950 cells/uL 1,320   1,650   1,341   1.3R     Eosinophils Absolute 15 - 500 cells/uL 220  300  149  0.3R    Basophils Absolute 0 - 200 cells/uL 0  0  0  0.1R    Neutrophils Relative % % 77  61  62  67    Lymphocytes Relative % _0 Monocytes Relative % _1 Eosinophils Relative % _2 Basophils Relative % 0  0  0  0    Smear Review  Criteria for review not...  Criteria for review not...  Criteria for review not...    Criteria for review not met   MPV    10.4    Resulting [place] SUN[facility]  Narrative   Performed at: [address], [address]        [place], [place] [address]            #5: High risk medication: Since 04/26/2016: Patient on Cosentyx. [ failed [name], Humira ]  Note: Recently finished prednisone taper. Today was his last prednisone pill. Patient is doing much better this visit than in the past visits. It is difficult to say whether it's the prednisone making the patient feel good or is it the Cosentyx and the Imuran combination that's making the patient feel better. Since he just finished her prednisone today, in the next 2 or 3 weeks it'll be only Cosentyx and Imuran that is responsible for controlling his arthritis and we will see how the patient does during that time.  #6: Patient states that he has a difficult time remembering when to take the Cosentyx and went to reorder the Cosentyx. As a result, our office will be putting together a schedule on a calendar so that patient is aware of that schedule.  #7: Patient is due to follow up with his hematologist oncologist, Dr. Larey Seat, at [facility] for his elevated white count. His most recent elevated white count done on 06/21/2016 showed about 22,000.  Orders: No orders of the defined types were placed in this encounter.  No orders of the defined types were placed in this encounter.   Face-to-face time spent with patient was 30 minutes. 50% of time was spent in counseling and coordination of care.  Follow-Up Instructions: Return in about 4 months (around 10/26/2016).   Eliezer Lofts, PA-C  Patient still had some synovitis on examination today. He also had rales in the left lung base on auscultation. He  has been taking Imuran intermittently. His been advised to take it on regular basis. We'll check his labs in a month. I examined and evaluated the patient with Eliezer Lofts PA. The plan of care was  discussed as noted above.  Bo Merino, MD Note - This record has been created using Editor, commissioning.  Chart creation errors have been sought, but may not always  have been located. Such creation errors do not reflect on  the standard of medical care.

## 2016-06-25 NOTE — Assessment & Plan Note (Addendum)
06/26/2016: Cosentyx started February 2018;  Imuran 50 mg daily(Dr. Elsworth Soho Prefers).  [Failed: Enbrel/Humira; too expensive: Arava;]

## 2016-06-25 NOTE — Telephone Encounter (Signed)
[phone] is the fax number, I have sent. Labs

## 2016-06-25 NOTE — Assessment & Plan Note (Signed)
Hx: Interstitial Lung Disease// Dr. Elsworth Soho prefers Imuran over [place] + Bactrim]

## 2016-06-25 NOTE — Telephone Encounter (Signed)
[phone] is the phone number for [facility] I have called for the fax number. Unfortunately no one answers    I have left message for them to call me back with the fax number.

## 2016-06-25 NOTE — Telephone Encounter (Signed)
-----   Message from Bo Merino, MD sent at 06/24/2016  1:09 PM EDT ----- C/w previous WBC. Please, fax labs to hematology

## 2016-06-26 ENCOUNTER — Encounter: Payer: Self-pay | Admitting: Rheumatology

## 2016-06-26 ENCOUNTER — Ambulatory Visit (INDEPENDENT_AMBULATORY_CARE_PROVIDER_SITE_OTHER): Payer: Medicare Other | Admitting: Rheumatology

## 2016-06-26 VITALS — BP 150/63 | HR 56 | Resp 13 | Ht 67.0 in | Wt 180.0 lb

## 2016-06-26 DIAGNOSIS — L4050 Arthropathic psoriasis, unspecified: Secondary | ICD-10-CM

## 2016-06-26 DIAGNOSIS — M2550 Pain in unspecified joint: Secondary | ICD-10-CM | POA: Diagnosis not present

## 2016-06-26 DIAGNOSIS — D72828 Other elevated white blood cell count: Secondary | ICD-10-CM | POA: Diagnosis not present

## 2016-06-26 DIAGNOSIS — M0579 Rheumatoid arthritis with rheumatoid factor of multiple sites without organ or systems involvement: Secondary | ICD-10-CM

## 2016-06-26 DIAGNOSIS — L409 Psoriasis, unspecified: Secondary | ICD-10-CM

## 2016-06-26 DIAGNOSIS — Z79899 Other long term (current) drug therapy: Secondary | ICD-10-CM | POA: Diagnosis not present

## 2016-06-26 DIAGNOSIS — J8410 Pulmonary fibrosis, unspecified: Secondary | ICD-10-CM

## 2016-06-26 NOTE — Patient Instructions (Signed)
   Patient will come back around third week of June 2018 to do lab work in the office.    Patient will return in third week of August for follow-up office visit and blood work at the office visit.  ==================  Standing Labs We placed an order today for your standing lab work.    Please come back and get your standing labs in  Every 2 months starting third week of June 2018, third week of August 2018 (during our office visit)  We have open lab Monday through Friday from 8:30-11:30 AM and 1:30-4 PM at the office of Dr. Tresa Moore, PA.   The office is located at [address] [place], [place], [place] [address] No appointment is necessary.   Labs are drawn by [facility].  You may receive a bill from [facility] for your lab work.     ===================================

## 2016-06-26 NOTE — Progress Notes (Signed)
Rheumatology Medication Review by a Pharmacist Does the patient feel that his/her medications are working for him/her?  Yes Has the patient been experiencing any side effects to the medications prescribed?  No Does the patient have any problems obtaining medications?  No  Issues to address at subsequent visits: None   Pharmacist comments:  Curtis Flores is a pleasant 61 yo M who presents for follow up.  He is currently taking Cosentyx.  He completed the Cosentyx loading dose on 05/24/16 and is currently taking Cosentyx 300 mg every 4 weeks.  He reports he took his most recent dose on 06/21/16.  Patient gets confused on Cosentyx dosing schedule.  I provided patient with a calendar and marked the dates he is due for his injections (07/19/16, 08/16/16, 09/13/16, and 10/11/16).  Also marked on the calendar a reminder to call [employer] to schedule delivery of Cosentyx one week before he is due for his dose and put the phone number on the calendar ([phone]).  I counseled patient on the risk of infection with Cosentyx.  Reviewed that Cosentyx should be held during an active infection or if patient is on antibiotics.  I reviewed that if the medication is held, patient should no longer follow the calendar I gave him today.  Reviewed that Cosentyx doses should always be given 4 weeks apart.  Patient voiced understanding. Most recent TB Gold negative on 09/26/15.  Will recommend TB Gold in July 2018.    Patient reports he is not sure if he is currently taking azathioprine (Imuran).  I called [facility] and spoke to Noma.  She reports patient last filled azathioprine on 02/03/2016.  He did not pick up the prescription that was sent on 04/26/16 (it is currently on hold).  Discussed with Dr. Estanislado Pandy and Curtis Flores.  Patient was advised to pick up his Imuran prescription and get repeat labs in 1 month.  I added a lab reminder to the calendar that was provided to patient.  Patient denies any further questions or concerns  regarding his medications at this time.    Elisabeth Most, Pharm.D., BCPS, CPP Clinical Pharmacist Pager: [phone] Phone: [phone] 06/26/2016 9:40 AM

## 2016-07-15 ENCOUNTER — Other Ambulatory Visit: Payer: Self-pay | Admitting: Internal Medicine

## 2016-07-16 DIAGNOSIS — I2510 Atherosclerotic heart disease of native coronary artery without angina pectoris: Secondary | ICD-10-CM | POA: Diagnosis not present

## 2016-07-16 DIAGNOSIS — I10 Essential (primary) hypertension: Secondary | ICD-10-CM | POA: Diagnosis not present

## 2016-07-16 DIAGNOSIS — E784 Other hyperlipidemia: Secondary | ICD-10-CM | POA: Diagnosis not present

## 2016-07-16 DIAGNOSIS — Z1159 Encounter for screening for other viral diseases: Secondary | ICD-10-CM | POA: Diagnosis not present

## 2016-07-16 DIAGNOSIS — D649 Anemia, unspecified: Secondary | ICD-10-CM | POA: Diagnosis not present

## 2016-07-16 DIAGNOSIS — I480 Paroxysmal atrial fibrillation: Secondary | ICD-10-CM | POA: Diagnosis not present

## 2016-07-16 DIAGNOSIS — R799 Abnormal finding of blood chemistry, unspecified: Secondary | ICD-10-CM | POA: Diagnosis not present

## 2016-07-16 DIAGNOSIS — E785 Hyperlipidemia, unspecified: Secondary | ICD-10-CM | POA: Diagnosis not present

## 2016-07-17 ENCOUNTER — Encounter (HOSPITAL_COMMUNITY): Payer: Medicare Other | Attending: Oncology | Admitting: Oncology

## 2016-07-17 ENCOUNTER — Encounter (HOSPITAL_COMMUNITY): Payer: Medicare Other

## 2016-07-17 ENCOUNTER — Encounter (HOSPITAL_COMMUNITY): Payer: Self-pay | Admitting: Oncology

## 2016-07-17 DIAGNOSIS — D72829 Elevated white blood cell count, unspecified: Secondary | ICD-10-CM

## 2016-07-17 DIAGNOSIS — D509 Iron deficiency anemia, unspecified: Secondary | ICD-10-CM | POA: Diagnosis not present

## 2016-07-17 DIAGNOSIS — D508 Other iron deficiency anemias: Secondary | ICD-10-CM

## 2016-07-17 DIAGNOSIS — D72823 Leukemoid reaction: Secondary | ICD-10-CM | POA: Diagnosis not present

## 2016-07-17 LAB — IRON AND TIBC
%SAT: 15 % (ref 15–60)
Iron: 33 ug/dL — ABNORMAL LOW (ref 50–180)
TIBC: 213 ug/dL — ABNORMAL LOW (ref 250–425)
UIBC: 180 ug/dL

## 2016-07-17 LAB — CBC WITH DIFFERENTIAL/PLATELET
BASOS ABS: 0.1 10*3/uL (ref 0.0–0.1)
BASOS ABS: 0 {cells}/uL (ref 0–200)
BASOS PCT: 0 %
Basophils Relative: 0 %
EOS ABS: 168 {cells}/uL (ref 15–500)
Eosinophils Absolute: 0.2 10*3/uL (ref 0.0–0.7)
Eosinophils Relative: 2 %
Eosinophils Relative: 1 %
HCT: 40.3 % (ref 39.0–52.0)
HCT: 40.2 % (ref 38.5–50.0)
HEMOGLOBIN: 13.1 g/dL (ref 13.0–17.0)
HEMOGLOBIN: 13 g/dL — AB (ref 13.2–17.1)
Lymphocytes Relative: 20 %
Lymphocytes Relative: 23 %
Lymphs Abs: 3.2 10*3/uL (ref 0.7–4.0)
Lymphs Abs: 3864 cells/uL (ref 850–3900)
MCH: 26.6 pg (ref 26.0–34.0)
MCH: 26.3 pg — AB (ref 27.0–33.0)
MCHC: 32.5 g/dL (ref 30.0–36.0)
MCHC: 32.3 g/dL (ref 32.0–36.0)
MCV: 81.9 fL (ref 78.0–100.0)
MCV: 81.4 fL (ref 80.0–100.0)
MONOS PCT: 9 %
MPV: 10.4 fL (ref 7.5–12.5)
Monocytes Absolute: 1.8 10*3/uL — ABNORMAL HIGH (ref 0.1–1.0)
Monocytes Absolute: 1512 cells/uL — ABNORMAL HIGH (ref 200–950)
Monocytes Relative: 11 %
NEUTROS ABS: 10.9 10*3/uL — AB (ref 1.7–7.7)
NEUTROS ABS: 11256 {cells}/uL — AB (ref 1500–7800)
NEUTROS PCT: 67 %
NEUTROS PCT: 67 %
PLATELETS: 412 10*3/uL — AB (ref 140–400)
Platelets: 379 10*3/uL (ref 150–400)
RBC: 4.92 MIL/uL (ref 4.22–5.81)
RBC: 4.94 MIL/uL (ref 4.20–5.80)
RDW: 15.7 % — ABNORMAL HIGH (ref 11.5–15.5)
RDW: 16 % — ABNORMAL HIGH (ref 11.0–15.0)
WBC: 16.2 10*3/uL — ABNORMAL HIGH (ref 4.0–10.5)
WBC: 16.8 10*3/uL — ABNORMAL HIGH (ref 3.8–10.8)

## 2016-07-17 LAB — COMPREHENSIVE METABOLIC PANEL
ALBUMIN: 3.1 g/dL — AB (ref 3.5–5.0)
ALK PHOS: 67 U/L (ref 38–126)
ALT: 16 U/L — ABNORMAL LOW (ref 17–63)
AST: 24 U/L (ref 15–41)
Anion gap: 9 (ref 5–15)
BILIRUBIN TOTAL: 0.5 mg/dL (ref 0.3–1.2)
BUN: 12 mg/dL (ref 6–20)
CALCIUM: 9.3 mg/dL (ref 8.9–10.3)
CO2: 29 mmol/L (ref 22–32)
Chloride: 99 mmol/L — ABNORMAL LOW (ref 101–111)
Creatinine, Ser: 0.65 mg/dL (ref 0.61–1.24)
GFR calc Af Amer: >60 mL/min (ref >60)
GFR calc non Af Amer: >60 mL/min (ref >60)
GLUCOSE: 109 mg/dL — AB (ref 65–99)
Potassium: 4 mmol/L (ref 3.5–5.1)
Sodium: 137 mmol/L (ref 135–145)
TOTAL PROTEIN: 7.4 g/dL (ref 6.5–8.1)

## 2016-07-17 LAB — FERRITIN: FERRITIN: 444 ng/mL — AB (ref 20–380)

## 2016-07-17 NOTE — Patient Instructions (Addendum)
[facility] at [facility] Discharge Instructions  RECOMMENDATIONS MADE BY THE CONSULTANT AND ANY TEST RESULTS WILL BE SENT TO YOUR REFERRING PHYSICIAN.  You were seen today by Dr. Louise Zhou Follow up in 6 months with lab work   Thank you for choosing [facility] at [facility] to provide your oncology and hematology care.  To afford each patient quality time with our provider, please arrive at least 15 minutes before your scheduled appointment time.    If you have a lab appointment with the Cancer Center please come in thru the  Main Entrance and check in at the main information desk  You need to re-schedule your appointment should you arrive 10 or more minutes late.  We strive to give you quality time with our providers, and arriving late affects you and other patients whose appointments are after yours.  Also, if you no show three or more times for appointments you may be dismissed from the clinic at the providers discretion.     Again, thank you for choosing [facility].  Our hope is that these requests will decrease the amount of time that you wait before being seen by our physicians.       _____________________________________________________________  Should you have questions after your visit to [facility], please contact our office at [phone] between the hours of 8:30 a.m. and 4:30 p.m.  Voicemails left after 4:30 p.m. will not be returned until the following business day.  For prescription refill requests, have your pharmacy contact our office.       Resources For Cancer Patients and their Caregivers ? American Cancer Society: Can assist with transportation, wigs, general needs, runs Look Good Feel Better.        1-888-227-6333 ? Cancer Care: Provides financial assistance, online support groups, medication/co-pay assistance.  1-800-813-HOPE (4673) ? [facility] Assists  [place] cancer patients and their families through emotional , educational and financial support.  336-427-4357 ? [place] DSS Where to apply for food stamps, Medicaid and utility assistance. 336-342-1394 ? [facility]: Transportation to medical appointments. 336-347-2287 ? Social Security Administration: May apply for disability if have a Stage IV cancer. 336-342-7796 1-800-772-1213 ? [place] Aging, Disability and Transit Services: Assists with nutrition, care and transit needs. 336-349-2343  Cancer Center Support Programs: @10RELATIVEDAYS@ > Cancer Support Group  2nd Tuesday of the month 1pm-2pm, Journey Room  > Creative Journey  3rd Tuesday of the month 1130am-1pm, Journey Room  > Look Good Feel Better  1st Wednesday of the month 10am-12 noon, Journey Room (Call American Cancer Society to register 1-800-395-5775)    

## 2016-07-17 NOTE — Progress Notes (Signed)
[facility] Hematology/Oncology Progress Note  Name: Curtis Flores      MRN: [id number]    Date: 07/17/2016 Time:11:21 AM   REFERRING PHYSICIAN:  Dr. Estanislado Pandy  REASON FOR CONSULT:  Leukocytosis   DIAGNOSIS:  Leukocytosis with neutrophilia and monocytosis- reactive  HISTORY OF PRESENT ILLNESS:   Curtis Flores is a pleasant 61 yo black American man with a past medical history significant for rheumatoid arthritis, pulmonary fibrosis, psoriasis, HTN, hypercholesterolemia, lymphadenopathy in the mediastinum and hilar area consistent to reactive interstitial lung disease, and history of leukocytosis that was worked up by Dr. Sherryl Manges ([facility]) in 2013 and was felt to be reactive.  The patient remembers his work-up with Dr. Lamonte Sakai in 2013.  He remembers the bone marrow biopsy being negative for any malignancy findings.  Elevated WBC count is felt to be reactive.   Peripheral flow cytometry in 2016 was negative.  Patient presents today for continued follow-up with his wife. He states he has diffuse bone pain from his rheumatoid arthritis. He recently was placed on a new medication for his RA about 2 weeks ago. Otherwise he is doing well. He denies any chest pain, shortness of breath, abdominal pain, focal weakness. He is eating and sleeping well. He denies any recent infections.  PAST MEDICAL HISTORY:   Past Medical History:  Diagnosis Date  . Abnormal CT scan, stomach   . Chronic systolic dysfunction of left ventricle   . Coronary artery disease    lateral STEMI 02/22/15  . GERD (gastroesophageal reflux disease)   . Hyperlipidemia   . Hypertension   . Ischemic cardiomyopathy   . LBBB (left bundle branch block)   . Leukocytosis    followed by hematology, reactive  . Lymphadenopathy   . Myocardial infarction ([place]) 02/2015  . Psoriasis 2003  . Psoriatic arthritis ([place])   . Pulmonary fibrosis ([place])   . Rheumatoid arthritis(714.0) 2012  . Typical atrial flutter ([facility])      ALLERGIES: No Known Allergies    MEDICATIONS: I have reviewed the patient's current medications.    Current Outpatient Prescriptions on File Prior to Visit  Medication Sig Dispense Refill  . atorvastatin (LIPITOR) 80 MG tablet Take 1 tablet (80 mg total) by mouth daily at 6 PM. NEED OV. 30 tablet 2  . azaTHIOprine (IMURAN) 50 MG tablet Take 1 tablet (50 mg total) by mouth 2 (two) times daily. 60 tablet 2  . carvedilol (COREG) 12.5 MG tablet TAKE 1 AND 1/2 TABLETS BY MOUTH TWICE DAILY (Patient taking differently: TAKE 1 AND 1/2 TABLETS BY MOUTH TWICE DAILY. Takes one tablet twice daily) 60 tablet 8  . clopidogrel (PLAVIX) 75 MG tablet TAKE 1 TABLET BY MOUTH EVERY DAY 30 tablet 6  . ferrous sulfate 325 (65 FE) MG tablet Take 325 mg by mouth 2 (two) times daily with a meal.     . furosemide (LASIX) 40 MG tablet Take 1 tablet (40 mg total) by mouth daily. 30 tablet 11  . losartan (COZAAR) 100 MG tablet TAKE 1 TABLET EVERY DAY 30 tablet 0  . pantoprazole (PROTONIX) 40 MG tablet TAKE 1 TABLET (40 MG TOTAL) BY MOUTH DAILY. 30 tablet 11  . ranitidine (ZANTAC) 300 MG tablet Take by mouth.     No current facility-administered medications on file prior to visit.      PAST SURGICAL HISTORY Past Surgical History:  Procedure Laterality Date  . CARDIAC CATHETERIZATION N/A 02/22/2015   Procedure: Left Heart  Cath and Coronary Angiography;  Surgeon: Lorretta Harp, MD;  Location: [facility];  Service: Cardiovascular;  Laterality: N/A;  . CARDIAC CATHETERIZATION N/A 02/22/2015   Procedure: Coronary Stent Intervention;  Surgeon: Lorretta Harp, MD;  Location: [facility];  Service: Cardiovascular;  Laterality: N/A;  . CARDIOVERSION N/A 04/12/2015   Procedure: CARDIOVERSION;  Surgeon: Larey Dresser, MD;  Location: [facility] ENDOSCOPY;  Service: Cardiovascular;  Laterality: N/A;  . COLONOSCOPY  07/01/2003   HGD:JMEQAS colonic mucosa except for the proximal right colon in the area of ICV  which was not seen completely due to inadequate bowel prep. followed with ACBE which was normal.   . COLONOSCOPY N/A 08/24/2015   Dr. Gala Romney: Normal colon. Next colonoscopy in 10 years.  [name] ELECTROPHYSIOLOGIC STUDY N/A 05/30/2015   Atrial fibrillation ablation by Dr Rayann Heman  . ESOPHAGOGASTRODUODENOSCOPY N/A 08/24/2015   Dr. Gala Romney: Medium-sized hiatal hernia, erosive gastropathy. Cameron lesions. Esophageal mucosa distally suggestive of short segment Barrett's esophagus. Not confirmed on biopsy. Gastric biopsy with minimal chronic inflammation  . GIVENS CAPSULE STUDY N/A 04/17/2016   Procedure: GIVENS CAPSULE STUDY;  Surgeon: Daneil Dolin, MD;  [facility];  Service: Endoscopy;  Laterality: N/A;  Pt to arrive at 8:00 am for 8:30 am appt  . TEE WITHOUT CARDIOVERSION N/A 04/12/2015   Procedure: TRANSESOPHAGEAL ECHOCARDIOGRAM (TEE);  Surgeon: Larey Dresser, MD;  Location: [facility] ENDOSCOPY;  Service: Cardiovascular;  Laterality: N/A;    FAMILY HISTORY: Father passed in his 30's from unknown reason Mother passed at 40 from MI and history of EtOHism He had 5 brothers, all deceased via MVA.  He has 1 son and 1 daughter (19 and 67 respectively).  He has an uncle with RA.  SOCIAL HISTORY:  reports that he quit smoking about 13 years ago. His smoking use included Cigarettes. He has a 30.00 pack-year smoking history. He has never used smokeless tobacco. He reports that he does not drink alcohol or use drugs.  PERFORMANCE STATUS: The patient's performance status is 1 - Symptomatic but completely ambulatory  ROS: Review of Systems  Constitutional: Negative for malaise/fatigue.  HENT: Negative.   Eyes: Negative.   Respiratory: Negative.   Cardiovascular: Negative.   Gastrointestinal: Negative.  Negative for blood in stool.  Genitourinary: Negative.   Musculoskeletal: Negative.        Diffuse joint pains  Skin: Negative.        Psoriasis   Neurological: Negative.   Endo/Heme/Allergies:  Negative.   Psychiatric/Behavioral: Negative.   All other systems reviewed and are negative.  14 point review of systems was performed and is negative except as detailed under history of present illness and above  PHYSICAL EXAM: Most Recent Vital Signs: Blood pressure 128/63, pulse 63, temperature 99.1 F (37.3 C), temperature source Oral, resp. rate 20, weight 177 lb (80.3 kg).    Physical Exam  Constitutional: He is oriented to person, place, and time and well-developed, well-nourished, and in no distress. No distress.  Wears glasses. Patient was able to get on the exam table without assistance.   HENT:  Head: Normocephalic and atraumatic.  Mouth/Throat: Oropharynx is clear and moist. No oropharyngeal exudate.  Eyes: Conjunctivae and EOM are normal. Pupils are equal, round, and reactive to light. Right eye exhibits no discharge. Left eye exhibits no discharge. No scleral icterus.  Neck: Normal range of motion. Neck supple. No JVD present. No tracheal deviation present. No thyromegaly present.  Cardiovascular: Normal rate, regular rhythm and  normal heart sounds.  Exam reveals no gallop and no friction rub.   No murmur heard. Pulmonary/Chest: Effort normal and breath sounds normal. No respiratory distress. He has no wheezes. He has no rales.  Abdominal: Soft. Bowel sounds are normal. He exhibits no distension and no mass. There is no tenderness. There is no rebound and no guarding.  Musculoskeletal: Normal range of motion. He exhibits no edema or tenderness.  Obvious RA in fingers and wrists  Lymphadenopathy:    He has no cervical adenopathy.  Neurological: He is alert and oriented to person, place, and time. No cranial nerve deficit. Gait normal.  Skin: Skin is warm and dry. No rash noted. No erythema. No pallor.  Psychiatric: Mood, memory, affect and judgment normal.  Nursing note and vitals reviewed.    LABORATORY DATA:   Outside laboratory studies for review.  Results for  DESIDERIO, DOLATA (MRN [id number]) as of 01/19/2016 08:40   Ref. Range 01/19/2016 12:30 01/19/2016 12:31  Sodium Latest Ref Range: 135 - 145 mmol/L 136   Potassium Latest Ref Range: 3.5 - 5.1 mmol/L 4.0   Chloride Latest Ref Range: 101 - 111 mmol/L 102   CO2 Latest Ref Range: 22 - 32 mmol/L 28   BUN Latest Ref Range: 6 - 20 mg/dL 10   Creatinine Latest Ref Range: 0.61 - 1.24 mg/dL 0.66   Calcium Latest Ref Range: 8.9 - 10.3 mg/dL 9.3   EGFR (Non-African Amer.) Latest Ref Range: >60 mL/min >60   EGFR (African American) Latest Ref Range: >60 mL/min >60   Glucose Latest Ref Range: 65 - 99 mg/dL 109 (H)   Anion gap Latest Ref Range: 5 - 15  6   Alkaline Phosphatase Latest Ref Range: 38 - 126 U/L 72   Albumin Latest Ref Range: 3.5 - 5.0 g/dL 3.0 (L)   AST Latest Ref Range: 15 - 41 U/L 27   ALT Latest Ref Range: 17 - 63 U/L 25   Total Protein Latest Ref Range: 6.5 - 8.1 g/dL 7.4   Total Bilirubin Latest Ref Range: 0.3 - 1.2 mg/dL 0.4   Iron Latest Ref Range: 45 - 182 ug/dL 19 (L)   UIBC Latest Units: ug/dL 183   TIBC Latest Ref Range: 250 - 450 ug/dL 202 (L)   Saturation Ratios Latest Ref Range: 17.9 - 39.5 % 9 (L)   Ferritin Latest Ref Range: 24 - 336 ng/mL 245   Folate Latest Ref Range: >5.9 ng/mL  16.0  Vitamin B12 Latest Ref Range: 180 - 914 pg/mL 885   WBC Latest Ref Range: 4.0 - 10.5 K/uL 16.9 (H)   RBC Latest Ref Range: 4.22 - 5.81 MIL/uL 4.78   Hemoglobin Latest Ref Range: 13.0 - 17.0 g/dL 12.4 (L)   HCT Latest Ref Range: 39.0 - 52.0 % 38.6 (L)   MCV Latest Ref Range: 78.0 - 100.0 fL 80.8   MCH Latest Ref Range: 26.0 - 34.0 pg 25.9 (L)   MCHC Latest Ref Range: 30.0 - 36.0 g/dL 32.1   RDW Latest Ref Range: 11.5 - 15.5 % 15.7 (H)   Platelets Latest Ref Range: 150 - 400 K/uL 349    Interpretation (BCRAL):  Comment   Comments: (NOTE)  The quantitative RT-PCR assay is negative for the b2a2 and b3a2  (p210) and e1a2 (p190) fusion gene transcripts found in chronic  myelogenous  leukemia and Philadelphia positive acute lymphocytic  leukemia. These results do not rule out the presence of low levels of  BCR-ABL1 transcript  below the level of detection of this assay, or  the presence of rare BCR-ABL1 transcripts not detected by this assay.        Pathologist smear review  Status: Finalresult Visible to patient:  Not Released Nextappt: 05/09/2015 at 12:20 PM in Oncology ([facility]) Dx:  Leukocytosis         58moago    Path Review Reviewed By BViolet Baldy M.D.   Comments: 08.08.16  NORMOCYTIC ANEMIA,  MILD LEUKOCYTOSIS.  [email or web address]      Specimen Collected: 10/07/14 1:26 PM Last Resulted: 10/10/14 10:31 AM           PATHOLOGY:    04/09/2014  Diagnosis Bone Marrow, Aspirate,Biopsy, and Clot, left posterior iliac crest - NORMOCELLULAR BONE MARROW FOR AGE WITH TRILINEAGE HEMATOPOIESIS. - A FEW SMALL LYMPHOID AGGREGATES PRESENT. - INCREASED IRON STORES.  FLOW CYTOMETRY REPORT INTERPRETATION Interpretation Peripheral Blood Flow Cytometry - PREDOMINANCE OF T LYMPHOCYTES WITH NO ABERRANT PHENOTYPE. - MINOR B-CELL POPULATION IDENTIFIED. Diagnosis Comment: The majority of lymphocytes consists of T cells with no aberrant phenotype. B cells represent a minor population (less than 10% of lymphocytes) with extremely dim staining for surface immunoglobulin light chains that hinder assessment and/or quantitation of clonality. However, an abnormal B-cell phenotype such as expression of CD10 or CD5 is not identified. Clinical correlation is recommended. (BNS:ecj 10/11/2014) BSusanne GreenhouseMD Pathologist, Electronic Signature (Case signed 10/11/2014) GROSS AND MICROSCOPIC INFORMATION Specimen Clinical Information leukocytosis Source Peripheral Blood Flow Cytometry Microscopic   ASSESSMENT/PLAN:  Leukocytosis, reactive Negative workup in 2013, including negative bone  marrow biopsy. Peripheral flow cytometry 2016 negative for monoclonal B cell population Rheumatoid arthritis Psoriatic arthritis Anemia of chronic disease Hypertension Iron studies c/w anemia chronic disease   I reviewed the patient's laboratory studies from today with him in detail and his leukocytosis is reactive and secondary to his RA. I have discussed with him that both prednisone use and infections may transiently Leukocytosis is stable. Continue observation at this time.  Given that he has underlying RA is more prone to developing lymphoproliferative disorders. Continue observation of his blood counts.  Return to clinic in 6 months for follow-up with repeat CBC and CMP. Continue follow up with his rheumatologist for management of his RA.    Orders Placed This Encounter  Procedures  . CBC with Differential    Standing Status:   Future    Standing Expiration Date:   07/17/2017  . Comprehensive metabolic panel    Standing Status:   Future    Standing Expiration Date:   07/17/2017     All questions were answered. The patient knows to call the clinic with any problems, questions or concerns. We can certainly see the patient much sooner if necessary.  This document serves as a record of services personally performed by SAncil Linsey MD. It was created on her behalf by JMartiniqueCasey, a trained medical scribe. The creation of this record is based on the scribe's personal observations and the provider's statements to them. This document has been checked and approved by the attending provider.  I have reviewed the above documentation for accuracy and completeness, and I agree with the above.  This note is electronically signed by: LTwana First MD   07/17/2016 11:21 AM

## 2016-07-18 ENCOUNTER — Other Ambulatory Visit (HOSPITAL_COMMUNITY): Payer: Medicare Other

## 2016-07-18 ENCOUNTER — Telehealth: Payer: Self-pay | Admitting: Rheumatology

## 2016-07-18 ENCOUNTER — Ambulatory Visit (HOSPITAL_COMMUNITY): Payer: Medicare Other | Admitting: Hematology & Oncology

## 2016-07-18 MED ORDER — PREDNISONE 5 MG PO TABS
ORAL_TABLET | ORAL | 0 refills | Status: DC
Start: 2016-07-18 — End: 2016-08-20

## 2016-07-18 NOTE — Telephone Encounter (Signed)
Reviewed with Mr. Carlyon Shadow. Okay to send a prescribe Prednisone Taper.   4po qAM x 5 days, 3po qAM x 5 days, 2po qAM x 5 days, 1po qAM x 5 days, 1/2po qAM x 5 days, then stop.  [name] advised prescription has been sent to the pharmacy for Mr. Engen.

## 2016-07-18 NOTE — Telephone Encounter (Signed)
Patient is in pain, patient's wife states the pain is all over. Patient is on Imuran and Cosentyx and taking them as prescribed. Patient requesting a prescription for prednisone.

## 2016-07-18 NOTE — Telephone Encounter (Signed)
Patients wife calling in reference to patients pain. Would like to know if patient can get a Prednisone rx. Patient uses [facility]. Please call to advise.

## 2016-07-18 NOTE — Progress Notes (Signed)
Hemoglobin is stable. Elevated WBC count being followed by hematology. Was seen yesterday by them. Ferritin elevated, likely due to acute phase reactant with autoimmune disease. Anemia chronic disease based on iron studies.   Would continue to monitor for any signs of anemia, ie fatigue, sob with walking, lightheadedness, black or bloody stools.   He has recall for upcoming ov this fall that he should keep or he can call sooner if needed.

## 2016-07-23 ENCOUNTER — Telehealth: Payer: Self-pay | Admitting: Adult Health

## 2016-07-23 ENCOUNTER — Other Ambulatory Visit: Payer: Self-pay

## 2016-07-23 DIAGNOSIS — Z79899 Other long term (current) drug therapy: Secondary | ICD-10-CM | POA: Diagnosis not present

## 2016-07-23 NOTE — Telephone Encounter (Signed)
This is fine Will sign off

## 2016-07-24 LAB — CBC WITH DIFFERENTIAL/PLATELET
BASOS ABS: 0 {cells}/uL (ref 0–200)
Basophils Relative: 0 %
EOS ABS: 0 {cells}/uL — AB (ref 15–500)
Eosinophils Relative: 0 %
HEMATOCRIT: 39.7 % (ref 38.5–50.0)
Hemoglobin: 12.6 g/dL — ABNORMAL LOW (ref 13.2–17.1)
LYMPHS PCT: 21 %
Lymphs Abs: 3675 cells/uL (ref 850–3900)
MCH: 25.8 pg — AB (ref 27.0–33.0)
MCHC: 31.7 g/dL — ABNORMAL LOW (ref 32.0–36.0)
MCV: 81.2 fL (ref 80.0–100.0)
MONO ABS: 350 {cells}/uL (ref 200–950)
MONOS PCT: 2 %
MPV: 10.9 fL (ref 7.5–12.5)
NEUTROS ABS: 13475 {cells}/uL — AB (ref 1500–7800)
Neutrophils Relative %: 77 %
PLATELETS: 429 10*3/uL — AB (ref 140–400)
RBC: 4.89 MIL/uL (ref 4.20–5.80)
RDW: 16.2 % — ABNORMAL HIGH (ref 11.0–15.0)
WBC: 17.5 10*3/uL — ABNORMAL HIGH (ref 3.8–10.8)

## 2016-07-24 LAB — COMPLETE METABOLIC PANEL WITH GFR
ALK PHOS: 66 U/L (ref 40–115)
ALT: 21 U/L (ref 9–46)
AST: 23 U/L (ref 10–35)
Albumin: 3.2 g/dL — ABNORMAL LOW (ref 3.6–5.1)
BILIRUBIN TOTAL: 0.3 mg/dL (ref 0.2–1.2)
BUN: 15 mg/dL (ref 7–25)
CALCIUM: 9.3 mg/dL (ref 8.6–10.3)
CO2: 27 mmol/L (ref 20–31)
CREATININE: 0.55 mg/dL — AB (ref 0.70–1.25)
Chloride: 103 mmol/L (ref 98–110)
Glucose, Bld: 95 mg/dL (ref 65–99)
Potassium: 4.4 mmol/L (ref 3.5–5.3)
Sodium: 142 mmol/L (ref 135–146)
TOTAL PROTEIN: 6.7 g/dL (ref 6.1–8.1)

## 2016-07-24 NOTE — Progress Notes (Signed)
WBC high, onPred

## 2016-08-01 ENCOUNTER — Other Ambulatory Visit: Payer: Self-pay | Admitting: Cardiovascular Disease

## 2016-08-06 ENCOUNTER — Ambulatory Visit: Payer: Medicare Other | Admitting: Physician Assistant

## 2016-08-12 ENCOUNTER — Other Ambulatory Visit: Payer: Self-pay | Admitting: Rheumatology

## 2016-08-12 ENCOUNTER — Telehealth: Payer: Self-pay

## 2016-08-12 ENCOUNTER — Other Ambulatory Visit: Payer: Self-pay | Admitting: Internal Medicine

## 2016-08-12 NOTE — Telephone Encounter (Signed)
Patient's wife has contacted [employer] regarding the Cosentyx delivery. She is awaiting a call back.

## 2016-08-12 NOTE — Telephone Encounter (Signed)
Patient wife had called concerning a Rx order that we be delivered on Tuesday 08/13/16, but patient wife stated that they will not be home.  Wanted to know who should she call concerning Rx being delivered.

## 2016-08-20 ENCOUNTER — Ambulatory Visit: Payer: Medicare Other | Admitting: Physician Assistant

## 2016-08-20 ENCOUNTER — Ambulatory Visit: Payer: Medicare Other | Admitting: Pulmonary Disease

## 2016-08-20 ENCOUNTER — Encounter: Payer: Self-pay | Admitting: Adult Health

## 2016-08-20 ENCOUNTER — Ambulatory Visit (INDEPENDENT_AMBULATORY_CARE_PROVIDER_SITE_OTHER): Payer: Medicare Other | Admitting: Adult Health

## 2016-08-20 ENCOUNTER — Ambulatory Visit (INDEPENDENT_AMBULATORY_CARE_PROVIDER_SITE_OTHER)
Admission: RE | Admit: 2016-08-20 | Discharge: 2016-08-20 | Disposition: A | Discharge status: Home / Self Care | Payer: Medicare Other | Source: Ambulatory Visit | Attending: Adult Health | Admitting: Adult Health

## 2016-08-20 VITALS — BP 102/58 | HR 51 | Ht 67.0 in | Wt 177.0 lb

## 2016-08-20 DIAGNOSIS — Z23 Encounter for immunization: Secondary | ICD-10-CM | POA: Diagnosis not present

## 2016-08-20 DIAGNOSIS — M0579 Rheumatoid arthritis with rheumatoid factor of multiple sites without organ or systems involvement: Secondary | ICD-10-CM

## 2016-08-20 DIAGNOSIS — J8410 Pulmonary fibrosis, unspecified: Secondary | ICD-10-CM | POA: Diagnosis not present

## 2016-08-20 NOTE — Progress Notes (Signed)
'@Patient'  ID: Curtis Flores, male    DOB: [birth year], 61 y.o.   MRN: [id number]  Chief Complaint  Patient presents with  . 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  Seen by Dr Curtis Flores for persistent leucocytosis, bone marrow biopsy neg  He underwent ablation and amiodarone was stopped.   TEST  Significant tests/ events 2012 -presented with a symmetric polyarthritis of elbows, wrists, hands, neck, shoulders &feet  CXR at [facility] showed bibasal interstitial fibrosis with hilar lymphadenopathy With POS RA 89 & CCP 122, ANA neg, HLA B 27 neg, CK 87  PPD neg, hep panel neg  HRCT - extensive regions of ground-glass attenuation and most pronounced in the lower lobes of the lungs bilaterally. These areas demonstrate internal interstitial prominence with a large amount of microcystic honeycombingwith some cylindrical bronchiectasis and bronchiolectasis. moderate hiatal hernia was also noted.   PFTs 2013 showed intraprenchymal restricton with FVC around 71% &DLCO 56% c/w ILD, no airway obstruction.  Rpt PFTs 10/2012 show mild drop in FVC to 66%, DLCO preserved   PFT 05/2015 FEV1 70%, ratio 82, FVC 66%, no BD response, DLCO 46%. DLCO has decreased from 2014.  CT chest 05/12/15 showed ILD/NSIP , slight progression compared from 2013.   PFT 12/2015 >> FVC 71%, DLCO 45% unc   08/20/2016 Follow up : Pulmonary FIbrosis  Pt returns for a 6 month follow up . Patient says overall that his breathing has been doing good since last visit. He denies any flare of cough, wheezing or shortness of breath. Says that his activity tolerance is about the same. He is limited due to arthritic pains. Patient has rheumatoid arthritis. He is followed by rheumatology.Now on  Imuran. He has recently started Cosentyx.  PFT done October 2017 showed stable lung function.[name]  PVX is utd. Discussed Prevnar 13 vaccine.   No Known Allergies  Immunization History  Administered Date(s) Administered  . Influenza Split 12/22/2010  . Influenza Whole 01/18/2010  . Influenza-Unspecified 12/03/2014, 12/13/2015  . Pneumococcal Polysaccharide-23 01/22/2011    Past Medical History:  Diagnosis Date  . Abnormal CT scan, stomach   . Chronic systolic dysfunction of left ventricle   . Coronary artery disease    lateral STEMI 02/22/15  . GERD (gastroesophageal reflux disease)   . Hyperlipidemia   . Hypertension   . Ischemic cardiomyopathy   . LBBB (left bundle branch block)   . Leukocytosis    followed by hematology, reactive  . Lymphadenopathy   . Myocardial infarction ([place]) 02/2015  . Psoriasis 2003  . Psoriatic arthritis ([place])   . Pulmonary fibrosis ([place])   . Rheumatoid arthritis(714.0) 2012  . Typical atrial flutter ([facility])     Tobacco History: History  Smoking Status  . Former Smoker  . Packs/day: 1.00  . Years: 30.00  . Types: Cigarettes  . Quit date: 03/21/2003  Smokeless Tobacco  . Never Used   Counseling given: Not Answered   Outpatient Encounter Prescriptions as of 08/20/2016  Medication Sig  . atorvastatin (LIPITOR) 80 MG tablet Take 1 tablet (80 mg total) by mouth daily at 6 PM. NEED OV.  . azaTHIOprine (IMURAN) 50 MG tablet Take 1 tablet (50 mg total) by mouth 2 (two) times daily.  . carvedilol (COREG) 12.5 MG tablet  TAKE 1 AND 1/2 TABLETS BY MOUTH TWICE DAILY (Patient taking differently: TAKE 1 AND 1/2 TABLETS BY MOUTH TWICE DAILY. Takes one tablet twice daily)  . clopidogrel (PLAVIX) 75 MG tablet TAKE 1 TABLET BY MOUTH EVERY DAY  . ferrous sulfate 325 (65 FE) MG tablet Take 325 mg by mouth 2 (two) times daily with a meal.   . furosemide (LASIX) 40 MG tablet TAKE 1 TABLET BY MOUTH TWICE A DAY  . losartan (COZAAR) 100 MG tablet TAKE 1 TABLET BY  MOUTH EVERY DAY  . pantoprazole (PROTONIX) 40 MG tablet TAKE 1 TABLET (40 MG TOTAL) BY MOUTH DAILY.  . ranitidine (ZANTAC) 300 MG tablet Take by mouth.  . Secukinumab (COSENTYX [place]) Inject into the skin every 30 (thirty) days.  . [DISCONTINUED] predniSONE (DELTASONE) 5 MG tablet 4po qAM x 5 days, 3po qAM x 5 days, 2po qAM x 5 days, 1po qAM x 5 days, 1/2po qAM x 5 days, then stop.   No facility-administered encounter medications on file as of 08/20/2016.      Review of Systems  Constitutional:   No  weight loss, night sweats,  Fevers, chills, fatigue, or  lassitude.  HEENT:   No headaches,  Difficulty swallowing,  Tooth/dental problems, or  Sore throat,                No sneezing, itching, ear ache, nasal congestion, post nasal drip,   CV:  No chest pain,  Orthopnea, PND, swelling in lower extremities, anasarca, dizziness, palpitations, syncope.   GI  No heartburn, indigestion, abdominal pain, nausea, vomiting, diarrhea, change in bowel habits, loss of appetite, bloody stools.   Resp:   No chest wall deformity  Skin: no rash or lesions.  GU: no dysuria, change in color of urine, no urgency or frequency.  No flank pain, no hematuria   MS:  No joint pain or swelling.  No decreased range of motion.  No back pain.    Physical Exam  BP (!) 102/58 (BP Location: Left Arm, Cuff Size: Normal)   Pulse (!) 51   Ht '5\' 7"'  (1.702 m)   Wt 177 lb (80.3 kg)   SpO2 96%   BMI 27.72 kg/m   GEN: A/Ox3; pleasant , NAD,  Elderly    HEENT:  [place]/AT,  EACs-clear, TMs-wnl, NOSE-clear, THROAT-clear, no lesions, no postnasal drip or exudate noted.   NECK:  Supple w/ fair ROM; no JVD; normal carotid impulses w/o bruits; no thyromegaly or nodules palpated; no lymphadenopathy.    RESP  Clear  P & A; w/o, wheezes/ rales/ or rhonchi. no accessory muscle use, no dullness to percussion  CARD:  RRR, no m/r/g, no peripheral edema, pulses intact, no cyanosis or clubbing.  GI:   Soft & nt; nml bowel sounds;  no organomegaly or masses detected.   Musco: Warm bil, no deformities or joint swelling noted.   Neuro: alert, no focal deficits noted.    Skin: Warm, no lesions or rashes    Lab Results:   BMET  Imaging: No results found.   Assessment & Plan:   Pulmonary fibrosis ([place]) Appears stable  Check cxr today  Check PFT on return .  Cont on RA regimen .   Rheumatoid arthritis ([place]) Cont on current regimen and.fu with rheumatology.      Rexene Edison, NP 08/20/2016

## 2016-08-20 NOTE — Patient Instructions (Addendum)
Continue on current regimen.  Prevnar 13 vaccine today .  Chest xray today .  Follow up Dr. Elsworth Soho  In 6 months with PFT w/ DLCO ( no BD )

## 2016-08-20 NOTE — Addendum Note (Signed)
Addended by: Chase Picket A on: 08/20/2016 11:27 AM   Modules accepted: Orders

## 2016-08-20 NOTE — Assessment & Plan Note (Signed)
Appears stable  Check cxr today  Check PFT on return .  Cont on RA regimen .

## 2016-08-20 NOTE — Assessment & Plan Note (Signed)
Cont on current regimen and.fu with rheumatology.

## 2016-08-20 NOTE — Addendum Note (Signed)
Addended by: Parke Poisson E on: 08/20/2016 11:14 AM   Modules accepted: Orders

## 2016-08-22 ENCOUNTER — Telehealth: Payer: Self-pay | Admitting: Adult Health

## 2016-08-22 NOTE — Telephone Encounter (Signed)
Notes recorded by Jannette Spanner, CMA on 08/22/2016 at 4:08 PM EDT ATC pt, no answer. Left message for pt to call back.  ------  Notes recorded by Melvenia Needles, NP on 08/20/2016 at 4:51 PM EDT CXR shows stable fibrosis  Cont w/ ov recs Please contact office for sooner follow up if symptoms do not improve or worsen or seek emergency care    Pt's wife aware of results and no further questions or concerns.

## 2016-08-26 NOTE — Progress Notes (Signed)
Reviewed & agree with plan  

## 2016-08-27 ENCOUNTER — Telehealth: Payer: Self-pay | Admitting: Rheumatology

## 2016-08-27 NOTE — Telephone Encounter (Signed)
Called patient asked him to come in today since he is painful, but he can not come in today, wife states his joints are painful but they are not able to make it into clinic

## 2016-08-27 NOTE — Telephone Encounter (Signed)
Patient's wife called this morning stating that her husband is in some pain this morning and that either Dr. Estanislado Pandy or Mr. Carlyon Shadow have prescribed Prednisone in the past.  I looked in his chart and do not see this medication prescribed before.  She is wanting to see if Dr. Estanislado Pandy will prescribe some for him.  CB#[phone].  Thank you

## 2016-08-28 NOTE — Telephone Encounter (Signed)
Patient's wife would like for you to call her.

## 2016-08-28 NOTE — Telephone Encounter (Signed)
Patient's wife [name] states patient is feeling better. Patient's wife will call back if he starts feeling bad again to schedule him an appointment.

## 2016-09-02 NOTE — Progress Notes (Signed)
Cardiology Office Note Date:  09/03/2016  Patient ID:  Curtis Flores, Curtis Flores Mar 19, 1955, MRN [id number] PCP:  Dione Housekeeper, MD  Cardiologist:  Dr. [name]    Chief Complaint: annual visit  History of Present Illness: Curtis Flores is a 61 y.o. male with history of RA (Dr, Estil Daft), pulmonary Fibrosis(Dr. Elsworth Soho), psoriasis, LBBB, HTN.HLD, CAD with MI in 2016, had three-vessel disease and underwent DES to the circumflex and first marginal branch by Dr. [name]. He had residual disease in the LAD and RCA.  His ejection fraction was around 35-40%.  Myoview 05/24/15 that showed no ischemia.  Feb 2017 presented to the ED in Feb with AF (flutter) with RVR and underwent TEE CV and was discharged on Amiodarone and anticoagulation. He does have interstitial lung disease and it was decided to recommend RFA so we could stop his Amiodarone.  He underwent RFA 05/30/15, had f/u with Dr. Rayann Heman in May his amiodarone and a/c stopped, though remarked his large LA is risk for Afib in the future, and would need a/c resumed if so.  He comes in today to be seen for Dr. Rayann Heman.  He is accompanied by his wife today.  Outside of his arthritis he is feeling well.  He denies any kind of CP, has never had palpitations again after his ablation.  No SOB, no dizziness, near syncope or syncope.  He reports his physical limitation is his arthritis.   Past Medical History:  Diagnosis Date  . Abnormal CT scan, stomach   . Chronic systolic dysfunction of left ventricle   . Coronary artery disease    lateral STEMI 02/22/15  . GERD (gastroesophageal reflux disease)   . Hyperlipidemia   . Hypertension   . Ischemic cardiomyopathy   . LBBB (left bundle branch block)   . Leukocytosis    followed by hematology, reactive  . Lymphadenopathy   . Myocardial infarction ([place]) 02/2015  . Psoriasis 2003  . Psoriatic arthritis ([place])   . Pulmonary fibrosis ([place])   . Rheumatoid arthritis(714.0) 2012  . Typical atrial flutter [facility])      Past Surgical History:  Procedure Laterality Date  . CARDIAC CATHETERIZATION N/A 02/22/2015   Procedure: Left Heart Cath and Coronary Angiography;  Surgeon: Lorretta Harp, MD;  Location: [facility] CV LAB;  Service: Cardiovascular;  Laterality: N/A;  . CARDIAC CATHETERIZATION N/A 02/22/2015   Procedure: Coronary Stent Intervention;  Surgeon: Lorretta Harp, MD;  Location: [facility] CV LAB;  Service: Cardiovascular;  Laterality: N/A;  . CARDIOVERSION N/A 04/12/2015   Procedure: CARDIOVERSION;  Surgeon: Larey Dresser, MD;  Location: [facility] ENDOSCOPY;  Service: Cardiovascular;  Laterality: N/A;  . COLONOSCOPY  07/01/2003   CXK:GYJEHU colonic mucosa except for the proximal right colon in the area of ICV which was not seen completely due to inadequate bowel prep. followed with ACBE which was normal.   . COLONOSCOPY N/A 08/24/2015   Dr. Gala Romney: Normal colon. Next colonoscopy in 10 years.  [name] ELECTROPHYSIOLOGIC STUDY N/A 05/30/2015   Atrial fibrillation ablation by Dr Rayann Heman  . ESOPHAGOGASTRODUODENOSCOPY N/A 08/24/2015   Dr. Gala Romney: Medium-sized hiatal hernia, erosive gastropathy. [place] lesions. Esophageal mucosa distally suggestive of short segment Barrett's esophagus. Not confirmed on biopsy. Gastric biopsy with minimal chronic inflammation  . GIVENS CAPSULE STUDY N/A 04/17/2016   Procedure: GIVENS CAPSULE STUDY;  Surgeon: Daneil Dolin, MD;  Location[facility];  Service: Endoscopy;  Laterality: N/A;  Pt to arrive at 8:00 am for 8:30 am appt  .  TEE WITHOUT CARDIOVERSION N/A 04/12/2015   Procedure: TRANSESOPHAGEAL ECHOCARDIOGRAM (TEE);  Surgeon: Larey Dresser, MD;  Location[facility]COPY;  Service: Cardiovascular;  Laterality: N/A;    Current Outpatient Prescriptions  Medication Sig Dispense Refill  . atorvastatin (LIPITOR) 80 MG tablet Take 1 tablet (80 mg total) by mouth daily at 6 PM. NEED OV. 30 tablet 2  . azaTHIOprine (IMURAN) 50 MG tablet Take 1 tablet (50 mg total) by mouth 2  (two) times daily. 60 tablet 2  . carvedilol (COREG) 12.5 MG tablet TAKE 1 AND 1/2 TABLETS BY MOUTH TWICE DAILY (Patient taking differently: TAKE 1 AND 1/2 TABLETS BY MOUTH TWICE DAILY. Takes one tablet twice daily) 60 tablet 8  . clopidogrel (PLAVIX) 75 MG tablet TAKE 1 TABLET BY MOUTH EVERY DAY 30 tablet 6  . ferrous sulfate 325 (65 FE) MG tablet Take 325 mg by mouth 2 (two) times daily with a meal.     . furosemide (LASIX) 40 MG tablet TAKE 1 TABLET BY MOUTH TWICE A DAY 60 tablet 0  . losartan (COZAAR) 100 MG tablet TAKE 1 TABLET BY MOUTH EVERY DAY 30 tablet 1  . pantoprazole (PROTONIX) 40 MG tablet TAKE 1 TABLET (40 MG TOTAL) BY MOUTH DAILY. 30 tablet 11  . ranitidine (ZANTAC) 300 MG tablet Take by mouth.    . Secukinumab (COSENTYX [place]) Inject into the skin every 30 (thirty) days.     No current facility-administered medications for this visit.     Allergies:   Patient has no known allergies.   Social History:  The patient  reports that he quit smoking about 13 years ago. His smoking use included Cigarettes. He has a 30.00 pack-year smoking history. He has never used smokeless tobacco. He reports that he does not drink alcohol or use drugs.   Family History:  The patient's family history includes Hypertension in his mother.  ROS:  Please see the history of present illness. All other systems are reviewed and otherwise negative.   PHYSICAL EXAM:  VS:  BP (!) 124/56   Pulse 64   Ht 5\' 7"  (1.702 m)   Wt 175 lb (79.4 kg)   BMI 27.41 kg/m  BMI: Body mass index is 27.41 kg/m. Well nourished, well developed, in no acute distress  HEENT: normocephalic, atraumatic  Neck: no JVD, carotid bruits or masses Cardiac: RRR; no significant murmurs, no rubs, or gallops Lungs:  CTA b/l, no wheezing, rhonchi or rales  Abd: soft, nontender MS: no deformity or atrophy Ext: no edema  Skin: warm and dry, no rash Neuro:  No gross deficits appreciated Psych: euthymic mood, full affect   EKG:  Done  today and reviewed by myself shows is SR, PAC, 64bpm, LBBB, LAD, appears unchanged  07/05/15: TTE Study Conclusions - Left ventricle: The cavity size was normal. There was moderate   concentric hypertrophy. Systolic function was moderately reduced.   The estimated ejection fraction was in the range of 35% to 40%.   Markedly abnormal GLPSS at -10% with inferior and inferolateral   strain abnormalities. Doppler parameters are consistent with   pseudonormal left ventricular relaxation (grade 2 diastolic   dysfunction). The E/A ratio is >2. The E/e&' ratio is >15,   suggesting elevated LV filling pressure. - Mitral valve: Calcified annulus. Mildly thickened leaflets .   There was trivial regurgitation. - Left atrium: Moderately dilated at 42 ml/m2. - Right ventricle: The cavity size was mildly dilated. Systolic   function was normal. - Right atrium:  The atrium was mildly dilated. - Tricuspid valve: There was moderate regurgitation. - Pulmonary arteries: Dilated. PA peak pressure: 54 mm Hg (S). Impressions: - Compared to the prior study in 04/2015, the LVEF has improved to   35-40%. Inferior, apical and lateral wall severe hypokinesis to   akinesis predominates. There is grade 2 diastolic dysfunction   with elevated LV filling pressures.  05/30/15: EPS, ablation, Dr. Rayann Heman CONCLUSIONS:  1. Sinus rhythm upon presentation.  2. Sustained atrial flutter, not induced today however the patient did have nonsustained atrial flutter which was consistent with typical atrial flutter 3. Empiric cavotricuspid isthmus ablation was performed with complete bidirectional isthmus block achieved.  4. No inducible arrhythmias following ablation.  5. No early apparent complications.   Recent Labs: 07/23/2016: ALT 21; BUN 15; Creat 0.55; Hemoglobin 12.6; Platelets 429; Potassium 4.4; Sodium 142  No results found for requested labs within last 8760 hours.   CrCl cannot be calculated (Patient's most recent lab  result is older than the maximum 21 days allowed.).   Wt Readings from Last 3 Encounters:  09/03/16 175 lb (79.4 kg)  08/20/16 177 lb (80.3 kg)  07/17/16 177 lb (80.3 kg)     Other studies reviewed: Additional studies/records reviewed today include: summarized above  ASSESSMENT AND PLAN:  1. AFlutter ablated 05/2015     No symptoms to suggest recurrent arrhythmia or AFib     Off amiodarone and a/c     CHA2DS2Vasc is 3, should he developed AFib will need to resume a/c     2. CAD     No anginal symptoms     On Plavix, CC, statin     C/w Dr. [name]  3. HTN     Looks OK, no changes  4. CM     No exam findings or symptoms to suggest fluid OL, weight is stable (down a couple pounds)     On BB/ARB, diuretic tx     07/23/16 Creat 0.55, K+ 4.4   Disposition: F/u with Dr. [name], cardiology in the next couple months to bring him up to date, can see EP PRN.  Current medicines are reviewed at length with the patient today.  The patient did not have any concerns regarding medicines.  Haywood Lasso, PA-C 09/03/2016 12:52 PM     [facility] [address] [place] [address] [phone] (office)  [phone] (fax)

## 2016-09-03 ENCOUNTER — Ambulatory Visit (INDEPENDENT_AMBULATORY_CARE_PROVIDER_SITE_OTHER): Payer: Medicare Other | Admitting: Physician Assistant

## 2016-09-03 ENCOUNTER — Other Ambulatory Visit: Payer: Self-pay | Admitting: Cardiovascular Disease

## 2016-09-03 VITALS — BP 124/56 | HR 64 | Ht 67.0 in | Wt 175.0 lb

## 2016-09-03 DIAGNOSIS — I2510 Atherosclerotic heart disease of native coronary artery without angina pectoris: Secondary | ICD-10-CM | POA: Diagnosis not present

## 2016-09-03 DIAGNOSIS — I4892 Unspecified atrial flutter: Secondary | ICD-10-CM

## 2016-09-03 DIAGNOSIS — I10 Essential (primary) hypertension: Secondary | ICD-10-CM | POA: Diagnosis not present

## 2016-09-03 DIAGNOSIS — I428 Other cardiomyopathies: Secondary | ICD-10-CM

## 2016-09-03 NOTE — Patient Instructions (Addendum)
Medication Instructions:   Your physician recommends that you continue on your current medications as directed. Please refer to the Current Medication list given to you today.   If you need a refill on your cardiac medications before your next appointment, please call your pharmacy.  Labwork: NONE ORDERED  TODAY    Testing/Procedures:  NONE ORDERED  TODAY    Follow-Up: in 3 MONTHS WITH  DR BERRY    Any Other Special Instructions Will Be Listed Below (If Applicable).

## 2016-09-05 ENCOUNTER — Encounter: Payer: Self-pay | Admitting: Rheumatology

## 2016-09-05 ENCOUNTER — Telehealth: Payer: Self-pay | Admitting: Rheumatology

## 2016-09-05 ENCOUNTER — Ambulatory Visit (INDEPENDENT_AMBULATORY_CARE_PROVIDER_SITE_OTHER): Payer: Medicare Other | Admitting: Rheumatology

## 2016-09-05 VITALS — BP 136/74 | HR 74 | Resp 14 | Wt 174.0 lb

## 2016-09-05 DIAGNOSIS — Z8719 Personal history of other diseases of the digestive system: Secondary | ICD-10-CM | POA: Diagnosis not present

## 2016-09-05 DIAGNOSIS — L4050 Arthropathic psoriasis, unspecified: Secondary | ICD-10-CM | POA: Diagnosis not present

## 2016-09-05 DIAGNOSIS — J479 Bronchiectasis, uncomplicated: Secondary | ICD-10-CM

## 2016-09-05 DIAGNOSIS — M2550 Pain in unspecified joint: Secondary | ICD-10-CM | POA: Diagnosis not present

## 2016-09-05 DIAGNOSIS — Z8679 Personal history of other diseases of the circulatory system: Secondary | ICD-10-CM

## 2016-09-05 DIAGNOSIS — Z79899 Other long term (current) drug therapy: Secondary | ICD-10-CM | POA: Diagnosis not present

## 2016-09-05 DIAGNOSIS — I2510 Atherosclerotic heart disease of native coronary artery without angina pectoris: Secondary | ICD-10-CM | POA: Diagnosis not present

## 2016-09-05 DIAGNOSIS — J8410 Pulmonary fibrosis, unspecified: Secondary | ICD-10-CM

## 2016-09-05 DIAGNOSIS — M0579 Rheumatoid arthritis with rheumatoid factor of multiple sites without organ or systems involvement: Secondary | ICD-10-CM

## 2016-09-05 DIAGNOSIS — D72823 Leukemoid reaction: Secondary | ICD-10-CM | POA: Diagnosis not present

## 2016-09-05 DIAGNOSIS — L409 Psoriasis, unspecified: Secondary | ICD-10-CM

## 2016-09-05 LAB — COMPLETE METABOLIC PANEL WITH GFR
ALT: 13 U/L (ref 9–46)
AST: 18 U/L (ref 10–35)
Albumin: 3.2 g/dL — ABNORMAL LOW (ref 3.6–5.1)
Alkaline Phosphatase: 69 U/L (ref 40–115)
BUN: 9 mg/dL (ref 7–25)
CHLORIDE: 98 mmol/L (ref 98–110)
CO2: 28 mmol/L (ref 20–31)
Calcium: 9.2 mg/dL (ref 8.6–10.3)
Creat: 0.53 mg/dL — ABNORMAL LOW (ref 0.70–1.25)
GFR, Est African American: >89 mL/min (ref >=60)
GFR, Est Non African American: >89 mL/min (ref >=60)
GLUCOSE: 90 mg/dL (ref 65–99)
POTASSIUM: 4 mmol/L (ref 3.5–5.3)
SODIUM: 135 mmol/L (ref 135–146)
Total Bilirubin: 0.4 mg/dL (ref 0.2–1.2)
Total Protein: 6.8 g/dL (ref 6.1–8.1)

## 2016-09-05 MED ORDER — PREDNISONE 5 MG PO TABS: ORAL_TABLET | ORAL | 0 refills | Status: DC

## 2016-09-05 MED ORDER — LEFLUNOMIDE 10 MG PO TABS: 10.0000 mg | ORAL_TABLET | Freq: Every day | ORAL | 2 refills | Status: DC

## 2016-09-05 NOTE — Patient Instructions (Addendum)
Standing Labs We placed an order today for your standing lab work.    Please come back and get your standing labs in 2 weeks, again in 4 weeks, then every 2 months   We have open lab Monday through Friday from 8:30-11:30 AM and 1:30-4 PM at the office of Dr. Bo Merino.   The office is located at [address] [place], [place], [place] [address] No appointment is necessary.   Labs are drawn by [facility].  You may receive a bill from [facility] for your lab work. If you have any questions regarding directions or hours of operation,  please call [phone].       Leflunomide tablets What is this medicine? LEFLUNOMIDE (le FLOO na mide) is for rheumatoid arthritis. This medicine may be used for other purposes; ask your health care provider or pharmacist if you have questions. COMMON BRAND NAME(S): Arava What should I tell my health care provider before I take this medicine? They need to know if you have any of these conditions: -alcoholism -bone marrow problems -fever or infection -immune system problems -kidney disease -liver disease -an unusual or allergic reaction to leflunomide, teriflunomide, other medicines, lactose, foods, dyes, or preservatives -pregnant or trying to get pregnant -breast-feeding How should I use this medicine? Take this medicine by mouth with a full glass of water. Follow the directions on the prescription label. Take your medicine at regular intervals. Do not take your medicine more often than directed. Do not stop taking except on your doctor's advice. Talk to your pediatrician regarding the use of this medicine in children. Special care may be needed. Overdosage: If you think you have taken too much of this medicine contact a poison control center or emergency room at once. NOTE: This medicine is only for you. Do not share this medicine with others. What if I miss a dose? If you miss a dose, take it as soon as you can. If it is almost time for your  next dose, take only that dose. Do not take double or extra doses. What may interact with this medicine? Do not take this medicine with any of the following medications: -teriflunomide This medicine may also interact with the following medications: -charcoal -cholestyramine -methotrexate -NSAIDs, medicines for pain and inflammation, like ibuprofen or naproxen -phenytoin -rifampin -tolbutamide -vaccines -warfarin This list may not describe all possible interactions. Give your health care provider a list of all the medicines, herbs, non-prescription drugs, or dietary supplements you use. Also tell them if you smoke, drink alcohol, or use illegal drugs. Some items may interact with your medicine. What should I watch for while using this medicine? Visit your doctor or health care professional for regular checks on your progress. You will need frequent blood checks while you are receiving the medicine. If you get a cold or other infection while receiving this medicine, call your doctor or health care professional. Do not treat yourself. The medicine may increase your risk of getting an infection. If you are a woman who has the potential to become pregnant, discuss birth control options with your doctor or health care professional. Azarion Bast must not be pregnant, and you must be using a reliable form of birth control. The medicine may harm an unborn baby. Immediately call your doctor if you think you might be pregnant. Alcoholic drinks may increase possible damage to your liver. Do not drink alcohol while taking this medicine. What side effects may I notice from receiving this medicine? Side effects that you should  report to your doctor or health care professional as soon as possible: -allergic reactions like skin rash, itching or hives, swelling of the face, lips, or tongue -cough -difficulty breathing or shortness of breath -fever, chills or any other sign of infection -redness, blistering, peeling  or loosening of the skin, including inside the mouth -unusual bleeding or bruising -unusually weak or tired -vomiting -yellowing of eyes or skin Side effects that usually do not require medical attention (report to your doctor or health care professional if they continue or are bothersome): -diarrhea -hair loss -headache -nausea This list may not describe all possible side effects. Call your doctor for medical advice about side effects. You may report side effects to FDA at 1-800-FDA-1088. Where should I keep my medicine? Keep out of the reach of children. Store at room temperature between 15 and 30 degrees C (59 and 86 degrees F). Protect from moisture and light. Throw away any unused medicine after the expiration date. NOTE: This sheet is a summary. It may not cover all possible information. If you have questions about this medicine, talk to your doctor, pharmacist, or health care provider.  2018 Elsevier/Gold Standard (2013-02-16 10:53:11)

## 2016-09-05 NOTE — Progress Notes (Signed)
Office Visit Note  Patient: Curtis Flores             Date of Birth: 06-19-1955           MRN: [id number]             PCP: Dione Housekeeper, MD Referring: Dione Housekeeper, MD Visit Date: 09/05/2016 Occupation: @GUAROCC @    Subjective:  Medication Management and Joint Pain   History of Present Illness: Curtis Flores is a 61 y.o. male  with history of psoriatic arthritis and psoriasis. He states she's been on Cosyntex for 2 months now. He states she's been taking prednisone for flares. As soon as he comes off the prednisone his disease flares. His last prednisone taper finished about 2 weeks ago. He's been having pain and has some shoulder joints elbow joints wrist joints hands knees and feet.  Activities of Daily Living:  Patient reports morning stiffness for 30 minutes.   Patient Reports nocturnal pain.  Difficulty dressing/grooming: Reports Difficulty climbing stairs: Reports Difficulty getting out of chair: Reports Difficulty using hands for taps, buttons, cutlery, and/or writing: Reports   Review of Systems  Constitutional: Positive for fatigue. Negative for night sweats and weakness ( ).  HENT: Positive for mouth dryness. Negative for nose dryness.   Eyes: Positive for dryness. Negative for redness.  Respiratory: Positive for shortness of breath. Negative for difficulty breathing.   Cardiovascular: Negative for chest pain, palpitations, hypertension, irregular heartbeat and swelling in legs/feet.  Gastrointestinal: Negative for constipation and diarrhea.  Endocrine: Negative for increased urination.  Musculoskeletal: Positive for arthralgias, joint pain, joint swelling and morning stiffness. Negative for myalgias, muscle weakness, muscle tenderness and myalgias.  Skin: Positive for rash. Negative for color change, hair loss, nodules/bumps, skin tightness, ulcers and sensitivity to sunlight.       Psoriasis  Allergic/Immunologic: Negative for susceptible to infections.    Neurological: Negative for dizziness, fainting, memory loss and night sweats.  Hematological: Negative for swollen glands.  Psychiatric/Behavioral: Positive for sleep disturbance. Negative for depressed mood. The patient is not nervous/anxious.     PMFS History:  Patient Active Problem List   Diagnosis Date Noted  . Leukocytosis 07/17/2016  . Pain in joint involving multiple sites 04/25/2016  . Psoriatic arthritis ([place]) 02/14/2016  . High risk medication use 02/14/2016  . NICM (nonischemic cardiomyopathy) ([place]) 08/29/2015  . Essential hypertension 08/29/2015  . Abnormal CT scan, stomach   . Hiatal hernia   . GERD (gastroesophageal reflux disease) 08/01/2015  . Weight loss, unintentional 08/01/2015  . Iron deficiency anemia 06/06/2015  . Heme positive stool 06/06/2015  . PAF (paroxysmal atrial fibrillation) ([place]) 05/30/2015  . CAD S/P percutaneous coronary angioplasty 04/07/2015  . Atrial flutter ([place]) 04/07/2015  . 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 Acute on chronic systolic congestive heart failure ([place]) 04/07/2015  . LBBB (left bundle branch block) 04/07/2015  . Wide-complex tachycardia ([place]) 04/07/2015  . Hyperlipidemia 03/07/2015  . Hypertension   . Psoriasis   . Rheumatoid arthritis ([place])   . Pulmonary fibrosis ([place]) 03/21/2011    Past Medical History:  Diagnosis Date  . Abnormal CT scan, stomach   . Chronic systolic dysfunction of left ventricle   . Coronary artery disease    lateral STEMI 02/22/15  . GERD (gastroesophageal reflux disease)   . Hyperlipidemia   . Hypertension   . Ischemic cardiomyopathy   . LBBB (left bundle branch block)   . Leukocytosis    followed by hematology, reactive  .  Lymphadenopathy   . Myocardial infarction ([place]) 02/2015  . Psoriasis 2003  . Psoriatic arthritis ([place])   . Pulmonary fibrosis ([place])   . Rheumatoid arthritis(714.0) 2012  . Typical atrial flutter (H[facility])     Family History  Problem Relation Age of  Onset  . Hypertension Mother   . Colon cancer Neg Hx    Past Surgical History:  Procedure Laterality Date  . CARDIAC CATHETERIZATION N/A 02/22/2015   Procedure: Left Heart Cath and Coronary Angiography;  Surgeon: Lorretta Harp, MD;  Location: [place];  Service: Cardiovascular;  Laterality: N/A;  . CARDIAC CATHETERIZATION N/A 02/22/2015   Procedure: Coronary Stent Intervention;  Surgeon: Lorretta Harp, MD;  Location: [place] [facility];  Service: Cardiovascular;  Laterality: N/A;  . CARDIOVERSION N/A 04/12/2015   Procedure: CARDIOVERSION;  Surgeon: Larey Dresser, MD;  Location: [facility] ENDOSCOPY;  Service: Cardiovascular;  Laterality: N/A;  . COLONOSCOPY  07/01/2003   IEP:PIRJJO colonic mucosa except for the proximal right colon in the area of ICV which was not seen completely due to inadequate bowel prep. followed with ACBE which was normal.   . COLONOSCOPY N/A 08/24/2015   Dr. Gala Romney: Normal colon. Next colonoscopy in 10 years.  [name] ELECTROPHYSIOLOGIC STUDY N/A 05/30/2015   Atrial fibrillation ablation by Dr Rayann Heman  . ESOPHAGOGASTRODUODENOSCOPY N/A 08/24/2015   Dr. Gala Romney: Medium-sized hiatal hernia, erosive gastropathy. Cameron lesions. Esophageal mucosa distally suggestive of short segment Barrett's esophagus. Not confirmed on biopsy. Gastric biopsy with minimal chronic inflammation  . GIVENS CAPSULE STUDY N/A 04/17/2016   Procedure: GIVENS CAPSULE STUDY;  Surgeon: Daneil Dolin, MD;  [facility];  Service: Endoscopy;  Laterality: N/A;  Pt to arrive at 8:00 am for 8:30 am appt  . TEE WITHOUT CARDIOVERSION N/A 04/12/2015   Procedure: TRANSESOPHAGEAL ECHOCARDIOGRAM (TEE);  Surgeon: Larey Dresser, MD;  Location: [facility] ENDOSCOPY;  Service: Cardiovascular;  Laterality: N/A;   Social History   Social History Narrative  . No narrative on file     Objective: Vital Signs: BP 136/74   Pulse 74   Resp 14   Wt 174 lb (78.9 kg)   BMI 27.25 kg/m    Physical Exam    Constitutional: He is oriented to person, place, and time. He appears well-developed and well-nourished.  HENT:  Head: Normocephalic and atraumatic.  Eyes: Conjunctivae and EOM are normal. Pupils are equal, round, and reactive to light.  Neck: Normal range of motion. Neck supple.  Cardiovascular: Normal rate, regular rhythm and normal heart sounds.   Pulmonary/Chest: Effort normal and breath sounds normal.  Abdominal: Soft. Bowel sounds are normal.  Neurological: He is alert and oriented to person, place, and time.  Skin: Skin is warm and dry. Capillary refill takes less than 2 seconds. Rash noted.  Psoriasis on face  Psychiatric: He has a normal mood and affect. His behavior is normal.  Nursing note and vitals reviewed.    Musculoskeletal Exam: C-spine and thoracic lumbar spine fairly good range of motion. He has discomfort range of motion of her shoulder joints. He has synovitis over multiple joints as described below.  CDAI Exam: CDAI Homunculus Exam:   Tenderness:  RUE: ulnohumeral and radiohumeral and wrist LUE: ulnohumeral and radiohumeral and wrist Right hand: 2nd MCP, 3rd MCP, 4th MCP, 5th MCP, 2nd PIP, 3rd PIP, 4th PIP and 5th PIP Left hand: 1st MCP, 2nd MCP, 3rd MCP, 4th MCP, 5th MCP, 2nd PIP, 3rd PIP, 4th PIP and 5th PIP RLE:  tibiofemoral LLE: tibiofemoral  Swelling:  RUE: ulnohumeral and radiohumeral and wrist LUE: ulnohumeral and radiohumeral and wrist Right hand: 1st MCP, 2nd MCP, 3rd MCP, 4th MCP, 5th MCP, 2nd PIP, 3rd PIP, 4th PIP and 5th PIP Left hand: 1st MCP, 2nd MCP, 3rd MCP, 4th MCP, 5th MCP, 2nd PIP, 3rd PIP, 4th PIP and 5th PIP RLE: tibiofemoral LLE: tibiofemoral  Joint Counts:  CDAI Tender Joint count: 23 CDAI Swollen Joint count: 24  Global Assessments:  Patient Global Assessment: 7 Provider Global Assessment: 7  CDAI Calculated Score: 61    Investigation: Findings:  09/29/2015 negative TB gold   CBC Latest Ref Rng & Units 09/05/2016  07/23/2016 07/17/2016  WBC 3.8 - 10.8 K/uL 14.5(H) 17.5(H) 16.2(H)  Hemoglobin 13.2 - 17.1 g/dL 12.4(L) 12.6(L) 13.1  Hematocrit 38.5 - 50.0 % 38.3(L) 39.7 40.3  Platelets 140 - 400 K/uL 379 429(H) 379    CMP Latest Ref Rng & Units 09/05/2016 07/23/2016 07/17/2016  Glucose 65 - 99 mg/dL 90 95 109(H)  BUN 7 - 25 mg/dL 9 15 12   Creatinine 0.70 - 1.25 mg/dL 0.53(L) 0.55(L) 0.65  Sodium 135 - 146 mmol/L 135 142 137  Potassium 3.5 - 5.3 mmol/L 4.0 4.4 4.0  Chloride 98 - 110 mmol/L 98 103 99(L)  CO2 20 - 31 mmol/L 28 27 29   Calcium 8.6 - 10.3 mg/dL 9.2 9.3 9.3  Total Protein 6.1 - 8.1 g/dL 6.8 6.7 7.4  Total Bilirubin 0.2 - 1.2 mg/dL 0.4 0.3 0.5  Alkaline Phos 40 - 115 U/L 69 66 67  AST 10 - 35 U/L 18 23 24   ALT 9 - 46 U/L 13 21 16(L)    Imaging: Dg Chest 2 View  Result Date: 08/20/2016 CLINICAL DATA:  Pulmonary fibrosis. EXAM: CHEST  2 VIEW COMPARISON:  CT 05/12/2015. Chest x-ray 04/07/2015. Chest x-ray 02/22/2015 FINDINGS: Heart size stable. Stable bilateral interstitial prominence consistent chronic interstitial lung disease. No pleural effusion or pneumothorax. No acute bony abnormality . IMPRESSION: 1. Chronic interstitial lung disease. 2.  No acute abnormality. Electronically Signed   By: Marcello Moores  Register   On: 08/20/2016 11:45    Speciality Comments: No specialty comments available.    Procedures:  No procedures performed Allergies: Patient has no known allergies.   Assessment / Plan:     Visit Diagnoses: Psoriatic arthritis [facility]): He is having severe flare of his psoriatic arthritis with pain and swelling in multiple joints. I would use prednisone as a bridging therapy. The plan is to start him on prednisone 20 mg by mouth daily for 1 week, then 15 mg by mouth daily for 2 weeks, 10 mg by mouth daily for 2 weeks, 7.5 mg by mouth daily for 2 weeks, 5 mg by mouth daily until follow-up visit. We discussed different treatment options. He has tried [place] in the past in 2016 but  discontinued the medication after he developed an infection behind his ear. He believes it was due to psoriasis. He is  willing to try to again. Indications side effects contraindications were discussed. Handout was given. After informed consent was obtained he was given Arava 10 mg by mouth daily for 2 weeks. I plan to check labs 2 weeks 2 and then every 2 months to monitor for drug toxicity. If his labs are normal in 2 weeks we will increase his Arava 20 mg by mouth daily. I would like to see how he does on combination therapy over the next 2-3 months. If he does not respond may  have to switch him to some other biologic agent. Options will be Simponi, Arlana Pouch.  Psoriasis: He has some rash on his face.  High risk medication use - Cosentyx 300 mg subcutaneous every month. I'll check his labs today. (Treated with Enbrel and Humira in the past which eventually failed. 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 Elsworth Soho.  Bronchiectasis: Noted on his last CT chest.  Rheumatoid arthritis involving multiple sites with positive rheumatoid factor (HCC) +RF +CCP   Leukemoid reaction: He is been on frequent prednisone taper due to severe arthritis.  History of gastroesophageal reflux (GERD)  History of coronary artery disease and left BBB   History of atrial fibrillation  History of congestive heart failure  History of hypertension    Orders: Orders Placed This Encounter  Procedures  . CBC with Differential/Platelet  . COMPLETE METABOLIC PANEL WITH GFR  . CBC with Differential/Platelet  . COMPLETE METABOLIC PANEL WITH GFR   Meds ordered this encounter  Medications  . leflunomide (ARAVA) 10 MG tablet    Sig: Take 1 tablet (10 mg total) by mouth daily. Take one tablet daily x 2 weeks, then increase to 2 tablets daily    Dispense:  60 tablet    Refill:  2  . predniSONE (DELTASONE) 5 MG tablet    Sig: 4 tab q am x7days, 3 tab q  am x 2weeks, 2 tabs q am x2wks, 1 1/2  tab q am x 2 wks ,then one daily    Dispense:  100 tablet    Refill:  0    Face-to-face time spent with patient was 40 minutes. 50% of time was spent in counseling and coordination of care.  Follow-Up Instructions: Return in about 3 months (around 12/06/2016) for Psoriatic arthritis.   Bo Merino, MD  Note - This record has been created using Editor, commissioning.  Chart creation errors have been sought, but may not always  have been located. Such creation errors do not reflect on  the standard of medical care.

## 2016-09-05 NOTE — Telephone Encounter (Signed)
Patients wife calling due to patient having a flare up of pain. Patient started having increased pain yesterday. Wife wanted to know if patient could come in tomorrow to see Dr. Please call to discuss, and advise patient.

## 2016-09-05 NOTE — Telephone Encounter (Signed)
Patient has been given an appointment for 09/05/16 @ 3:15 pm

## 2016-09-06 LAB — CBC WITH DIFFERENTIAL/PLATELET
Basophils Absolute: 0 cells/uL (ref 0–200)
Basophils Relative: 0 %
EOS PCT: 2 %
Eosinophils Absolute: 290 cells/uL (ref 15–500)
HCT: 38.3 % — ABNORMAL LOW (ref 38.5–50.0)
Hemoglobin: 12.4 g/dL — ABNORMAL LOW (ref 13.2–17.1)
LYMPHS ABS: 4205 {cells}/uL — AB (ref 850–3900)
LYMPHS PCT: 29 %
MCH: 26.2 pg — ABNORMAL LOW (ref 27.0–33.0)
MCHC: 32.4 g/dL (ref 32.0–36.0)
MCV: 80.8 fL (ref 80.0–100.0)
MPV: 10.4 fL (ref 7.5–12.5)
Monocytes Absolute: 1450 cells/uL — ABNORMAL HIGH (ref 200–950)
Monocytes Relative: 10 %
NEUTROS PCT: 59 %
Neutro Abs: 8555 cells/uL — ABNORMAL HIGH (ref 1500–7800)
PLATELETS: 379 10*3/uL (ref 140–400)
RBC: 4.74 MIL/uL (ref 4.20–5.80)
RDW: 16.2 % — AB (ref 11.0–15.0)
WBC: 14.5 10*3/uL — AB (ref 3.8–10.8)

## 2016-09-06 NOTE — Progress Notes (Signed)
stable °

## 2016-09-17 ENCOUNTER — Other Ambulatory Visit: Payer: Self-pay | Admitting: Cardiovascular Disease

## 2016-09-19 ENCOUNTER — Telehealth: Payer: Self-pay

## 2016-09-19 ENCOUNTER — Other Ambulatory Visit: Payer: Self-pay | Admitting: Pharmacist

## 2016-09-19 DIAGNOSIS — D899 Disorder involving the immune mechanism, unspecified: Secondary | ICD-10-CM

## 2016-09-19 DIAGNOSIS — D849 Immunodeficiency, unspecified: Secondary | ICD-10-CM

## 2016-09-19 DIAGNOSIS — Z79899 Other long term (current) drug therapy: Secondary | ICD-10-CM

## 2016-09-19 DIAGNOSIS — Z114 Encounter for screening for human immunodeficiency virus [HIV]: Secondary | ICD-10-CM | POA: Diagnosis not present

## 2016-09-19 LAB — CBC WITH DIFFERENTIAL/PLATELET
BASOS PCT: 0 %
Basophils Absolute: 0 cells/uL (ref 0–200)
EOS PCT: 0 %
Eosinophils Absolute: 0 cells/uL — ABNORMAL LOW (ref 15–500)
HCT: 42.6 % (ref 38.5–50.0)
Hemoglobin: 13.6 g/dL (ref 13.2–17.1)
Lymphocytes Relative: 17 %
Lymphs Abs: 3009 cells/uL (ref 850–3900)
MCH: 26.7 pg — ABNORMAL LOW (ref 27.0–33.0)
MCHC: 31.9 g/dL — ABNORMAL LOW (ref 32.0–36.0)
MCV: 83.5 fL (ref 80.0–100.0)
MONOS PCT: 2 %
MPV: 11.9 fL (ref 7.5–12.5)
Monocytes Absolute: 354 cells/uL (ref 200–950)
Neutro Abs: 14337 cells/uL — ABNORMAL HIGH (ref 1500–7800)
Neutrophils Relative %: 81 %
PLATELETS: 291 10*3/uL (ref 140–400)
RBC: 5.1 MIL/uL (ref 4.20–5.80)
RDW: 17.1 % — ABNORMAL HIGH (ref 11.0–15.0)
WBC: 17.7 10*3/uL — ABNORMAL HIGH (ref 3.8–10.8)

## 2016-09-19 MED ORDER — ABATACEPT 125 MG/ML ~~LOC~~ SOAJ: 125.0000 mg | SUBCUTANEOUS | 2 refills | Status: DC

## 2016-09-19 NOTE — Telephone Encounter (Addendum)
Filled out Patient Assistance Application for Orencia with patient while in clinic. Used financial documents from previous application. Patient states that his income has changed. He will fax the updated ones to [place]. Faxed application to [phone]. Will submit updated documents once received.  Will update once we receive a response.  Diva Lemberger, [place], CPhT 2:42 PM

## 2016-09-19 NOTE — Addendum Note (Signed)
Addended by: Cyndia Skeeters on: 09/19/2016 02:34 PM   Modules accepted: Orders

## 2016-09-19 NOTE — Progress Notes (Signed)
Pharmacy Note  Subjective: Patient came into the office for labs today.  When reviewing what labs patient needed, I noted that patient is currently taking Cosentyx 300 mg every 4 weeks, azathioprine 100 mg daily, and leflunomide 10 mg daily (added on 09/05/16).  Discussed with Dr. Estanislado Pandy.  Decision was made to discontinue leflunomide.  Continue azathioprine 100 mg daily due to interstitial lung disease, and consider changing Cosentyx.  Patient has diagnosis of psoriatic arthritis and rheumatoid arthritis.  Decision was made to apply for Orencia as it is approved for both indications.  Patient seen by the pharmacist for counseling on subcutnaeous Orencia.    Objective: CBC    Component Value Date/Time   WBC 14.5 (H) 09/05/2016 1652   RBC 4.74 09/05/2016 1652   HGB 12.4 (L) 09/05/2016 1652   HGB 14.5 08/12/2011 1256   HCT 38.3 (L) 09/05/2016 1652   HCT 44.7 08/12/2011 1256   PLT 379 09/05/2016 1652   PLT 240 08/12/2011 1256   MCV 80.8 09/05/2016 1652   MCV 82.2 08/12/2011 1256   MCH 26.2 (L) 09/05/2016 1652   MCHC 32.4 09/05/2016 1652   RDW 16.2 (H) 09/05/2016 1652   RDW 14.7 (H) 08/12/2011 1256   LYMPHSABS 4,205 (H) 09/05/2016 1652   LYMPHSABS 5.5 (H) 08/12/2011 1256   MONOABS 1,450 (H) 09/05/2016 1652   MONOABS 1.6 (H) 08/12/2011 1256   EOSABS 290 09/05/2016 1652   EOSABS 0.2 08/12/2011 1256   BASOSABS 0 09/05/2016 1652   BASOSABS 0.1 08/12/2011 1256   CMP     Component Value Date/Time   NA 135 09/05/2016 1652   K 4.0 09/05/2016 1652   CL 98 09/05/2016 1652   CO2 28 09/05/2016 1652   GLUCOSE 90 09/05/2016 1652   BUN 9 09/05/2016 1652   CREATININE 0.53 (L) 09/05/2016 1652   CALCIUM 9.2 09/05/2016 1652   PROT 6.8 09/05/2016 1652   ALBUMIN 3.2 (L) 09/05/2016 1652   AST 18 09/05/2016 1652   ALT 13 09/05/2016 1652   ALKPHOS 69 09/05/2016 1652   BILITOT 0.4 09/05/2016 1652   GFRNONAA >89 09/05/2016 1652   GFRAA >89 09/05/2016 1652   TB Gold: negative  (09/26/15) Hepatitis panel: negative (12/10/2010) HIV: ordered today SPEP: ordered today Ig: ordered today  Assessment/Plan:  Counseled patient that Orencia is a selective T-cell costimulation blocker indicated for rheumatoid arthritis.  Counseled patient on purpose, proper use, and adverse effects of Orencia. The most common adverse effects are increased risk of infections, headache, and injection reactions.  There is the possibility of an increased risk of malignancy but it is not well understood if this increased risk is due to the medication or the disease state.  Provided patient with medication education material and answered all questions.  Patient consented to [facility].  Will upload consent into patient's chart.  Will submit Orencia patient assistance application as patient does not have prescription drug insurance.  Reviewed storage information for Orencia.  Advised initial injection must be administered in office.  Patient reports he just took Cosentyx on 09/13/16.  Advised patient to discontinue Cosentyx.  Advised initial Orencia injection cannot be until 10/11/16 or later.    Patient is due for TB Gold this month.  TB Gold ordered today.    Patient was instructed to stop leflunomide.    Elisabeth Most, Pharm.D., BCPS Clinical Pharmacist Pager: [phone] Phone: [phone] 09/19/2016 1:48 PM

## 2016-09-20 ENCOUNTER — Other Ambulatory Visit: Payer: Self-pay | Admitting: Rheumatology

## 2016-09-20 DIAGNOSIS — D508 Other iron deficiency anemias: Secondary | ICD-10-CM

## 2016-09-20 LAB — COMPLETE METABOLIC PANEL WITH GFR
ALT: 30 U/L (ref 9–46)
AST: 23 U/L (ref 10–35)
Albumin: 3.2 g/dL — ABNORMAL LOW (ref 3.6–5.1)
Alkaline Phosphatase: 72 U/L (ref 40–115)
BILIRUBIN TOTAL: 0.4 mg/dL (ref 0.2–1.2)
BUN: 14 mg/dL (ref 7–25)
CO2: 25 mmol/L (ref 20–31)
CREATININE: 0.8 mg/dL (ref 0.70–1.25)
Calcium: 9.2 mg/dL (ref 8.6–10.3)
Chloride: 103 mmol/L (ref 98–110)
GFR, Est African American: >89 mL/min (ref >=60)
GLUCOSE: 105 mg/dL — AB (ref 65–99)
Potassium: 4.3 mmol/L (ref 3.5–5.3)
SODIUM: 142 mmol/L (ref 135–146)
TOTAL PROTEIN: 6.6 g/dL (ref 6.1–8.1)

## 2016-09-20 LAB — IGG, IGA, IGM
IgA: 531 mg/dL — ABNORMAL HIGH (ref 81–463)
IgG (Immunoglobin G), Serum: 1412 mg/dL (ref 694–1618)
IgM, Serum: 136 mg/dL (ref 48–271)

## 2016-09-20 LAB — HIV ANTIBODY (ROUTINE TESTING W REFLEX): HIV 1&2 Ab, 4th Generation: NONREACTIVE

## 2016-09-20 NOTE — Telephone Encounter (Signed)
Last Visit: 09/05/16 Next Visit: 11/26/16 Labs: 09/05/16 Stable  Okay to refill per Dr. Estanislado Pandy

## 2016-09-21 LAB — QUANTIFERON TB GOLD ASSAY (BLOOD)
Interferon Gamma Release Assay: NEGATIVE
MITOGEN-NIL SO: 1.88 [IU]/mL
Quantiferon Nil Value: 0.37 IU/mL
Quantiferon Tb Ag Minus Nil Value: <0 IU/mL

## 2016-09-21 NOTE — Progress Notes (Signed)
Elevated WBCs on Pred. Rets of the labs are stable

## 2016-09-23 LAB — PROTEIN ELECTROPHORESIS, SERUM, WITH REFLEX
ALPHA-1-GLOBULIN: 0.4 g/dL — AB (ref 0.2–0.3)
ALPHA-2-GLOBULIN: 0.9 g/dL (ref 0.5–0.9)
Albumin ELP: 3.5 g/dL — ABNORMAL LOW (ref 3.8–4.8)
BETA 2: 0.5 g/dL (ref 0.2–0.5)
BETA GLOBULIN: 0.5 g/dL (ref 0.4–0.6)
GAMMA GLOBULIN: 1.3 g/dL (ref 0.8–1.7)
Total Protein, Serum Electrophoresis: 7.1 g/dL (ref 6.1–8.1)

## 2016-09-25 ENCOUNTER — Telehealth: Payer: Self-pay | Admitting: Rheumatology

## 2016-09-25 NOTE — Telephone Encounter (Signed)
Received a fax from patient with updated financial documents for the assistance program. Faxed documents to foundation. Will update once we receive a response.  Tanisha Lutes, [place], CPhT 3:16 PM

## 2016-09-25 NOTE — Telephone Encounter (Signed)
Patients wife calling to let you know she faxed in the paperwork. Patients wife request you call her back, she has a question.

## 2016-09-25 NOTE — Telephone Encounter (Signed)
Spoke to patient to inform her that I received their income documents. I will update them once I receive a response. She also wanted to know the results from Mr. Rago lab work. The call was transferred to [facility].

## 2016-09-27 NOTE — Telephone Encounter (Signed)
Called [employer] to check the status of patient's application. Spoke to [place] who states that they are missing the patient's signed agreement and consent form. Faxed a copy of signed form along with the updated financial document again to foundation. Because the foundation is backed up with reviewing applications, there is a 7 to 10 business day turnaround.   Will check back and update once we receive a response.   Keeven Matty, [place], CPhT 10:02 AM

## 2016-10-01 ENCOUNTER — Telehealth: Payer: Self-pay

## 2016-10-01 NOTE — Telephone Encounter (Signed)
Called [employer] Patient [employer] to check the status of patient's application. Spoke to [place] who states that they received the signed consent and financial documents. The application is in the benefit verification stage.   Will follow up and update once we receive a response.   Patient ID: [id number]  Demetrios Loll, CPhT 1:24 PM

## 2016-10-02 ENCOUNTER — Telehealth: Payer: Self-pay

## 2016-10-02 NOTE — Telephone Encounter (Signed)
Received a renewal verification application from [place] for patient's [place]. Patient is currently enrolled in the assistance foundation through 03/03/17. In order to be considered for continued assistance through this program, your patient will need to reapply.   Spoke to patient to give him the update. Patient has been approved to receive medication from the foundation free of charge through 03/03/17. He will need to set up a nursing visit for his first dose. He will also be required to sign the renewal application. We will get that taken care of at his nursing visit. Patient voiced understanding and denied any questions at this time.   Please call patient to set up nursing visit. His initial Orencia injection cannot be unitl 10/11/16 or later. Thanks!  Curtis Flores, [place], CPhT 2:59 PM

## 2016-10-02 NOTE — Telephone Encounter (Signed)
Received a approval letter from [place] for [place] for Mr. Berkheimer. Patient has been approved from 09/30/16 through 03/03/17.   Will send document to scan center  Case ID: [id number] Phone: [phone]  Patient will need to set up a nursing visit for his first injection. Left message for patient to call back and update him.  Jabree Pernice, [place], CPhT 11:17 AM

## 2016-10-03 ENCOUNTER — Other Ambulatory Visit: Payer: Self-pay | Admitting: Cardiovascular Disease

## 2016-10-04 ENCOUNTER — Telehealth: Payer: Self-pay | Admitting: Pharmacist

## 2016-10-04 NOTE — Telephone Encounter (Signed)
I received a call from Mrs. Barreiro who states patient received his shipment of [place] today.  She confirms she is storing the medication in the refrigerator.  Most recent Cosentyx injection was 09/13/16.  Patient is aware to discontinue Cosentyx.  Nurse visit for initial Orencia injection scheduled for 10/11/16 at 2 PM.  Advised patient we will provide him with an [place] sample for initial injection.    Elisabeth Most, Pharm.D., BCPS, CPP Clinical Pharmacist Pager: [phone] Phone: [phone] 10/04/2016 4:27 PM

## 2016-10-09 ENCOUNTER — Other Ambulatory Visit: Payer: Self-pay | Admitting: Internal Medicine

## 2016-10-11 ENCOUNTER — Ambulatory Visit (INDEPENDENT_AMBULATORY_CARE_PROVIDER_SITE_OTHER): Payer: Medicare Other | Admitting: *Deleted

## 2016-10-11 VITALS — BP 157/66 | HR 48

## 2016-10-11 DIAGNOSIS — L4050 Arthropathic psoriasis, unspecified: Secondary | ICD-10-CM

## 2016-10-11 MED ORDER — ABATACEPT 125 MG/ML ~~LOC~~ SOAJ
125.0000 mg | Freq: Once | SUBCUTANEOUS | Status: AC
  Administered 2016-10-11: 125 mg via SUBCUTANEOUS

## 2016-10-11 NOTE — Patient Instructions (Addendum)
Start injecting Orencia 125 mg under the skin weekly  Do not take any more Cosentyx.  Standing Labs We placed an order today for your standing lab work.    Please come back and get your standing labs in 1 month then every 3 months.  We have open lab Monday through Friday from 8:30-11:30 AM and 1:30-4 PM at the office of Dr. Bo Merino.   The office is located at [address] [place], [place], [place] [address] No appointment is necessary.   Labs are drawn by [facility].  You may receive a bill from [facility] for your lab work. If you have any questions regarding directions or hours of operation,  please call [phone].    Abatacept solution for injection (subcutaneous or intravenous use) What is this medicine? ABATACEPT (a ba TA sept) is used to treat moderate to severe active rheumatoid arthritis or psoriatic arthritis in adults. This medicine is also used to treat juvenile idiopathic arthritis. This medicine may be used for other purposes; ask your health care provider or pharmacist if you have questions. COMMON BRAND NAME(S): Orencia What should I tell my health care provider before I take this medicine? They need to know if you have any of these conditions: -are taking other medicines to treat rheumatoid arthritis -COPD -diabetes -infection or history of infections -recently received or scheduled to receive a vaccine -scheduled to have surgery -tuberculosis, a positive skin test for tuberculosis or have recently been in close contact with someone who has tuberculosis -viral hepatitis -an unusual or allergic reaction to abatacept, other medicines, foods, dyes, or preservatives -pregnant or trying to get pregnant -breast-feeding How should I use this medicine? This medicine is for infusion into a vein or for injection under the skin. Infusions are given by a health care professional in a hospital or clinic setting. If you are to give your own medicine at home, you will be  taught how to prepare and give this medicine under the skin. Use exactly as directed. Take your medicine at regular intervals. Do not take your medicine more often than directed. It is important that you put your used needles and syringes in a special sharps container. Do not put them in a trash can. If you do not have a sharps container, call your pharmacist or healthcare provider to get one. Talk to your pediatrician regarding the use of this medicine in children. While infusions in a clinic may be prescribed for children as young as 2 years for selected conditions, precautions do apply. Overdosage: If you think you have taken too much of this medicine contact a poison control center or emergency room at once. NOTE: This medicine is only for you. Do not share this medicine with others. What if I miss a dose? This medicine is used once a week if given by injection under the skin. If you miss a dose, take it as soon as you can. If it is almost time for your next dose, take only that dose. Do not take double or extra doses. If you are to be given an infusion, it is important not to miss your dose. Doses are usually every 4 weeks. Call your doctor or health care professional if you are unable to keep an appointment. What may interact with this medicine? Do not take this medicine with any of the following medications: -adalimumab -anakinra -certolizumab -etanercept -golimumab -infliximab -live virus vaccines -rituximab -tocilizumab This medicine may also interact with the following medications: -vaccines This list may not  describe all possible interactions. Give your health care provider a list of all the medicines, herbs, non-prescription drugs, or dietary supplements you use. Also tell them if you smoke, drink alcohol, or use illegal drugs. Some items may interact with your medicine. What should I watch for while using this medicine? Visit your doctor for regular check ups while you are taking  this medicine. Tell your doctor or healthcare professional if your symptoms do not start to get better or if they get worse. Call your doctor or health care professional if you get a cold or other infection while receiving this medicine. Do not treat yourself. This medicine may decrease your body's ability to fight infection. Try to avoid being around people who are sick. What side effects may I notice from receiving this medicine? Side effects that you should report to your doctor or health care professional as soon as possible: -allergic reactions like skin rash, itching or hives, swelling of the face, lips, or tongue -breathing problems -chest pain -signs of infection - fever or chills, cough, unusual tiredness, pain or trouble passing urine, or warm, red or painful skin Side effects that usually do not require medical attention (report to your doctor or health care professional if they continue or are bothersome): -dizziness -headache -nausea, vomiting -sore throat -stomach upset This list may not describe all possible side effects. Call your doctor for medical advice about side effects. You may report side effects to FDA at 1-800-FDA-1088. Where should I keep my medicine? Infusions will be given in a hospital or clinic and will not be stored at home. Storage for syringes given under the skin and stored at home: Keep out of the reach of children. Store in a refrigerator between 2 and 8 degrees C (36 and 46 degrees F). Keep this medicine in the original container. Protect from light. Do not freeze. Throw away any unused medicine after the expiration date. NOTE: This sheet is a summary. It may not cover all possible information. If you have questions about this medicine, talk to your doctor, pharmacist, or health care provider.  2018 Elsevier/Gold Standard (2015-09-07 10:07:35)

## 2016-10-11 NOTE — Progress Notes (Signed)
Patient in office for a new start to [place]. Patient was provided with a sample. Patient was given the injection in his left thigh. Patient tolerated injection well. Patient was monitored in office for 30 minutes after administration for adverse reactions. No adverse reactions noted.   Administrations This Visit    Abatacept SOAJ 125 mg    Admin Date 10/11/2016 Action Given Dose 125 mg Route Subcutaneous Administered By Carole Binning, LPN

## 2016-10-11 NOTE — Progress Notes (Signed)
Pharmacy Note  Subjective:   Patient is being initiated on Orencia.  Patient was previously counseled extensively on Orencia on 09/19/16 and consented to initiation of Orencia at that time.  Patient presents to clinic today to receive the first dose of Orencia.  Patient confirms it has been at least 4 weeks since his most recent Cosentyx.  Patient denies any active infection.     Objective: CMP     Component Value Date/Time   NA 142 09/19/2016 1315   K 4.3 09/19/2016 1315   CL 103 09/19/2016 1315   CO2 25 09/19/2016 1315   GLUCOSE 105 (H) 09/19/2016 1315   BUN 14 09/19/2016 1315   CREATININE 0.80 09/19/2016 1315   CALCIUM 9.2 09/19/2016 1315   PROT 6.6 09/19/2016 1315   ALBUMIN 3.2 (L) 09/19/2016 1315   AST 23 09/19/2016 1315   ALT 30 09/19/2016 1315   ALKPHOS 72 09/19/2016 1315   BILITOT 0.4 09/19/2016 1315   GFRNONAA >89 09/19/2016 1315   GFRAA >89 09/19/2016 1315   CBC    Component Value Date/Time   WBC 17.7 (H) 09/19/2016 1315   RBC 5.10 09/19/2016 1315   HGB 13.6 09/19/2016 1315   HGB 14.5 08/12/2011 1256   HCT 42.6 09/19/2016 1315   HCT 44.7 08/12/2011 1256   PLT 291 09/19/2016 1315   PLT 240 08/12/2011 1256   MCV 83.5 09/19/2016 1315   MCV 82.2 08/12/2011 1256   MCH 26.7 (L) 09/19/2016 1315   MCHC 31.9 (L) 09/19/2016 1315   RDW 17.1 (H) 09/19/2016 1315   RDW 14.7 (H) 08/12/2011 1256   LYMPHSABS 3,009 09/19/2016 1315   LYMPHSABS 5.5 (H) 08/12/2011 1256   MONOABS 354 09/19/2016 1315   MONOABS 1.6 (H) 08/12/2011 1256   EOSABS 0 (L) 09/19/2016 1315   EOSABS 0.2 08/12/2011 1256   BASOSABS 0 09/19/2016 1315   BASOSABS 0.1 08/12/2011 1256   TB Gold: negative (09/19/16)  Assessment/Plan:  Patient was counseled on how to administer subcutaneous Orencia injection using a demonstration pen.  Patient received his first dose of Orencia in the left thigh.  Lot: [id number], Exp. 09/2017.  Patient was monitored for 30 minutes post injection.  No injection site reaction noted.   Patient will need standing lab orders in one month.  Provided patient with standing lab instructions and placed standing lab order.  Patient is scheduled for follow up on 11/26/16.    Elisabeth Most, Pharm.D., BCPS, CPP Clinical Pharmacist Pager: [phone] Phone: [phone] 10/11/2016 2:58 PM

## 2016-10-16 ENCOUNTER — Ambulatory Visit: Payer: Medicare Other | Admitting: Rheumatology

## 2016-10-16 ENCOUNTER — Encounter: Payer: Self-pay | Admitting: Internal Medicine

## 2016-10-18 ENCOUNTER — Other Ambulatory Visit: Payer: Self-pay | Admitting: Cardiovascular Disease

## 2016-10-23 ENCOUNTER — Ambulatory Visit: Payer: Medicare Other | Admitting: Rheumatology

## 2016-11-01 ENCOUNTER — Telehealth: Payer: Self-pay | Admitting: Rheumatology

## 2016-11-01 MED ORDER — PREDNISONE 5 MG PO TABS: ORAL_TABLET | ORAL | 0 refills | Status: DC

## 2016-11-01 NOTE — Telephone Encounter (Signed)
Pred 5 mg 4x4d, 3x4d, 2x4d, 1x4d, 1/2x4d

## 2016-11-01 NOTE — Telephone Encounter (Signed)
Patient states he is having stiffness and soreness in his shoulders, elbows , neck and hands. Patient is requesting a prescription for Prednisone to help with this until the Orencia kicks in. Patient started Orencia on 10/11/16. Please advise.

## 2016-11-01 NOTE — Telephone Encounter (Signed)
Left message to advise patient prescription sent to the pharmacy.  °

## 2016-11-01 NOTE — Telephone Encounter (Signed)
Patient called requesting rx for Prednisone to be called in. Patient uses [facility] in [place].

## 2016-11-11 DIAGNOSIS — D649 Anemia, unspecified: Secondary | ICD-10-CM | POA: Diagnosis not present

## 2016-11-11 DIAGNOSIS — Z1159 Encounter for screening for other viral diseases: Secondary | ICD-10-CM | POA: Diagnosis not present

## 2016-11-11 DIAGNOSIS — R799 Abnormal finding of blood chemistry, unspecified: Secondary | ICD-10-CM | POA: Diagnosis not present

## 2016-11-11 DIAGNOSIS — E785 Hyperlipidemia, unspecified: Secondary | ICD-10-CM | POA: Diagnosis not present

## 2016-11-12 DIAGNOSIS — I10 Essential (primary) hypertension: Secondary | ICD-10-CM | POA: Diagnosis not present

## 2016-11-12 DIAGNOSIS — D649 Anemia, unspecified: Secondary | ICD-10-CM | POA: Diagnosis not present

## 2016-11-12 DIAGNOSIS — Z23 Encounter for immunization: Secondary | ICD-10-CM | POA: Diagnosis not present

## 2016-11-12 DIAGNOSIS — I2510 Atherosclerotic heart disease of native coronary artery without angina pectoris: Secondary | ICD-10-CM | POA: Diagnosis not present

## 2016-11-14 ENCOUNTER — Other Ambulatory Visit: Payer: Self-pay | Admitting: Gastroenterology

## 2016-11-15 NOTE — Progress Notes (Signed)
Office Visit Note  Patient: Curtis Flores             Date of Birth: Jul 09, 1955           MRN: [id number]             PCP: Dione Housekeeper, MD Referring: Dione Housekeeper, MD Visit Date: 11/26/2016 Occupation: @GUAROCC @    Subjective:  Pain in joints.   History of Present Illness: Curtis Flores is a 61 y.o. male with psoriatic arthritis and psoriasis. He was a started on Orencia subcutaneous on 10/11/2016. He had a flare about 3 weeks ago for which she was given a prednisone taper he is gradually reducing prednisone and currently on 10 mg by mouth daily. He states he is doing quite well on prednisone taper currently. He's been also taking Imuran 100 mg a day. He has a flare of psoriasis on his face. Currently is not having any discomfort in any of his joints.  Activities of Daily Living:  Patient reports morning stiffness for 0 minute.   Patient Denies nocturnal pain.  Difficulty dressing/grooming: Denies Difficulty climbing stairs: Denies Difficulty getting out of chair: Denies Difficulty using hands for taps, buttons, cutlery, and/or writing: Denies   Review of Systems  Constitutional: Negative for fatigue, night sweats and weakness ( ).  HENT: Negative for mouth sores, mouth dryness and nose dryness.   Eyes: Negative for redness and dryness.  Respiratory: Positive for shortness of breath. Negative for difficulty breathing.        On exertion  Cardiovascular: Positive for hypertension. Negative for chest pain, palpitations, irregular heartbeat and swelling in legs/feet.  Gastrointestinal: Negative for constipation and diarrhea.  Endocrine: Negative for increased urination.  Musculoskeletal: Negative for arthralgias, joint pain, joint swelling, myalgias, muscle weakness, morning stiffness, muscle tenderness and myalgias.  Skin: Positive for rash. Negative for color change, hair loss, nodules/bumps, skin tightness, ulcers and sensitivity to sunlight.       psoriasis on face    Allergic/Immunologic: Negative for susceptible to infections.  Neurological: Negative for dizziness, fainting, memory loss and night sweats.  Hematological: Negative for swollen glands.  Psychiatric/Behavioral: Negative for depressed mood and sleep disturbance. The patient is not nervous/anxious.     PMFS History:  Patient Active Problem List   Diagnosis Date Noted  . Leukocytosis 07/17/2016  . Pain in joint involving multiple sites 04/25/2016  . Psoriatic arthritis ([place]) 02/14/2016  . High risk medication use 02/14/2016  . NICM (nonischemic cardiomyopathy) ([place]) 08/29/2015  . Essential hypertension 08/29/2015  . Abnormal CT scan, stomach   . Hiatal hernia   . GERD (gastroesophageal reflux disease) 08/01/2015  . Weight loss, unintentional 08/01/2015  . Iron deficiency anemia 06/06/2015  . Heme positive stool 06/06/2015  . PAF (paroxysmal atrial fibrillation) ([place]) 05/30/2015  . CAD S/P percutaneous coronary angioplasty 04/07/2015  . Atrial flutter ([place]) 04/07/2015  . 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 Acute on chronic systolic congestive heart failure ([place]) 04/07/2015  . LBBB (left bundle branch block) 04/07/2015  . Wide-complex tachycardia ([place]) 04/07/2015  . Hyperlipidemia 03/07/2015  . Hypertension   . Psoriasis   . Rheumatoid arthritis ([place])   . Pulmonary fibrosis ([place]) 03/21/2011    Past Medical History:  Diagnosis Date  . Abnormal CT scan, stomach   . Chronic systolic dysfunction of left ventricle   . Coronary artery disease    lateral STEMI 02/22/15  . GERD (gastroesophageal reflux disease)   . Hyperlipidemia   . Hypertension   .  Ischemic cardiomyopathy   . LBBB (left bundle branch block)   . Leukocytosis    followed by hematology, reactive  . Lymphadenopathy   . Myocardial infarction ([place]) 02/2015  . Psoriasis 2003  . Psoriatic arthritis ([place])   . Pulmonary fibrosis ([place])   . Rheumatoid arthritis(714.0) 2012  . Typical atrial flutter  (HCC)     Family History  Problem Relation Age of Onset  . Hypertension Mother   . Colon cancer Neg Hx    Past Surgical History:  Procedure Laterality Date  . CARDIAC CATHETERIZATION N/A 02/22/2015   Procedure: Left Heart Cath and Coronary Angiography;  Surgeon: Lorretta Harp, MD;  Location: [facility];  Service: Cardiovascular;  Laterality: N/A;  . CARDIAC CATHETERIZATION N/A 02/22/2015   Procedure: Coronary Stent Intervention;  Surgeon: Lorretta Harp, MD;  Location: [facility];  Service: Cardiovascular;  Laterality: N/A;  . CARDIOVERSION N/A 04/12/2015   Procedure: CARDIOVERSION;  Surgeon: Larey Dresser, MD;  Location: [facility] ENDOSCOPY;  Service: Cardiovascular;  Laterality: N/A;  . COLONOSCOPY  07/01/2003   TDV:VOHYWV colonic mucosa except for the proximal right colon in the area of ICV which was not seen completely due to inadequate bowel prep. followed with ACBE which was normal.   . COLONOSCOPY N/A 08/24/2015   Dr. Gala Romney: Normal colon. Next colonoscopy in 10 years.  [name] ELECTROPHYSIOLOGIC STUDY N/A 05/30/2015   Atrial fibrillation ablation by Dr Rayann Heman  . ESOPHAGOGASTRODUODENOSCOPY N/A 08/24/2015   Dr. Gala Romney: Medium-sized hiatal hernia, erosive gastropathy. Cameron lesions. Esophageal mucosa distally suggestive of short segment Barrett's esophagus. Not confirmed on biopsy. Gastric biopsy with minimal chronic inflammation  . GIVENS CAPSULE STUDY N/A 04/17/2016   Procedure: GIVENS CAPSULE STUDY;  Surgeon: Daneil Dolin, MD;  [facility];  Service: Endoscopy;  Laterality: N/A;  Pt to arrive at 8:00 am for 8:30 am appt  . TEE WITHOUT CARDIOVERSION N/A 04/12/2015   Procedure: TRANSESOPHAGEAL ECHOCARDIOGRAM (TEE);  Surgeon: Larey Dresser, MD;  Location: [facility] ENDOSCOPY;  Service: Cardiovascular;  Laterality: N/A;   Social History   Social History Narrative  . No narrative on file     Objective: Vital Signs: BP (!) 167/76 (BP Location: Left Arm, Patient  Position: Sitting, Cuff Size: Normal)   Pulse (!) 54   Resp 15   Ht 5\' 7"  (1.702 m)   Wt 184 lb (83.5 kg)   BMI 28.82 kg/m    Physical Exam  Constitutional: He is oriented to person, place, and time. He appears well-developed and well-nourished.  HENT:  Head: Normocephalic and atraumatic.  Eyes: Pupils are equal, round, and reactive to light. Conjunctivae and EOM are normal.  Neck: Normal range of motion. Neck supple.  Cardiovascular: Normal rate, regular rhythm and normal heart sounds.   Pulmonary/Chest: Effort normal and breath sounds normal.  Abdominal: Soft. Bowel sounds are normal.  Neurological: He is alert and oriented to person, place, and time.  Skin: Skin is warm and dry. Capillary refill takes less than 2 seconds.  Psychiatric: He has a normal mood and affect. His behavior is normal.  Nursing note and vitals reviewed.    Musculoskeletal Exam: c-spine and thoracic lumbar spine good range of motion. Shoulder joints elbow joints are good range of motion. He is synovitis over his bilateral MCP joints as described below. Hip joints knee joints ankles with good range of motion with no synovitis.  CDAI Exam: CDAI Homunculus Exam:   Tenderness:  Right hand: 2nd MCP and  5th MCP Left hand: 1st MCP, 2nd MCP and 5th MCP  Swelling:  Right hand: 2nd MCP and 5th MCP Left hand: 1st MCP, 2nd MCP and 5th MCP  Joint Counts:  CDAI Tender Joint count: 5 CDAI Swollen Joint count: 5  Global Assessments:  Patient Global Assessment: 3 Provider Global Assessment: 5  CDAI Calculated Score: 18    Investigation: No additional findings.TB Gold: negative in 09/2016 CBC Latest Ref Rng & Units 09/19/2016 09/05/2016 07/23/2016  WBC 3.8 - 10.8 K/uL 17.7(H) 14.5(H) 17.5(H)  Hemoglobin 13.2 - 17.1 g/dL 13.6 12.4(L) 12.6(L)  Hematocrit 38.5 - 50.0 % 42.6 38.3(L) 39.7  Platelets 140 - 400 K/uL 291 379 429(H)   CMP Latest Ref Rng & Units 09/19/2016 09/05/2016 07/23/2016  Glucose 65 - 99 mg/dL  105(H) 90 95  BUN 7 - 25 mg/dL 14 9 15   Creatinine 0.70 - 1.25 mg/dL 0.80 0.53(L) 0.55(L)  Sodium 135 - 146 mmol/L 142 135 142  Potassium 3.5 - 5.3 mmol/L 4.3 4.0 4.4  Chloride 98 - 110 mmol/L 103 98 103  CO2 20 - 31 mmol/L 25 28 27   Calcium 8.6 - 10.3 mg/dL 9.2 9.2 9.3  Total Protein 6.1 - 8.1 g/dL 6.6 6.8 6.7  Total Bilirubin 0.2 - 1.2 mg/dL 0.4 0.4 0.3  Alkaline Phos 40 - 115 U/L 72 69 66  AST 10 - 35 U/L 23 18 23   ALT 9 - 46 U/L 30 13 21     Imaging: No results found.  Speciality Comments: No specialty comments available.    Procedures:  No procedures performed Allergies: Patient has no known allergies.   Assessment / Plan:     Visit Diagnoses: Psoriatic arthritis ([place]): He has some very aggressive  disease and difficult to treat.he has failed Enbrel, Humira, which has a lot, Cosyntex in the past. He was started on Orencia in August. He is also on Imuran. Prednisone taper was given couple of weeks ago. Is currently feeling better. I'm hoping that the prednisone patient therapy may help until Orencia starts working for him.we will continue prednisone taper hopefully to get him off prednisone but if needed he may have to stay on low-dose prednisone. His been more active lately.  Other psoriasis: He is having mild flare of psoriasis on his face. I've advised him to schedule an appointment with his dermatologist.  High risk medication use - orencia sq(10/11/2016), imuran 100 mg po qd, prednisone10 mg po qd. ( Inadequate response to Enbrel, Humira, Cosyntex, Otezla )  - Plan: CBC with Differential/Platelet, COMPLETE METABOLIC PANEL WITH GFRtoday and then every 3 months to monitor for drug toxicity.  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 Elsworth Soho.  History of bronchiectasis - Noted on his last CT chest.  Screening for osteoporosis: I will schedule bone density as he is been on long-term prednisone.  Rheumatoid arthritis involving multiple sites with  positive rheumatoid factor (HCC) - +RF, +CCP  His other medical problems are listed as follows:  History of coronary artery disease - left BBB , Plavix  History of congestive heart failure  History of hypertension: His blood pressure is elevated today have advised him to monitor his blood pressure closely  History of atrial fibrillation  History of hyperlipidemia  History of gastroesophageal reflux (GERD)    Orders: Orders Placed This Encounter  Procedures  . DG DXA FRACTURE ASSESSMENT   No orders of the defined types were placed in this encounter.   Face-to-face time spent with patient  was 30 minutes. Greater than50% of time was spent in counseling and coordination of care.  Follow-Up Instructions: Return in about 3 months (around 02/25/2017) for Psoriatic arthritis.   Bo Merino, MD  Note - This record has been created using Editor, commissioning.  Chart creation errors have been sought, but may not always  have been located. Such creation errors do not reflect on  the standard of medical care.

## 2016-11-26 ENCOUNTER — Encounter: Payer: Self-pay | Admitting: Rheumatology

## 2016-11-26 ENCOUNTER — Telehealth: Payer: Self-pay

## 2016-11-26 ENCOUNTER — Ambulatory Visit (INDEPENDENT_AMBULATORY_CARE_PROVIDER_SITE_OTHER): Payer: Medicare Other | Admitting: Rheumatology

## 2016-11-26 ENCOUNTER — Other Ambulatory Visit: Payer: Self-pay | Admitting: Cardiovascular Disease

## 2016-11-26 VITALS — BP 167/76 | HR 54 | Resp 15 | Ht 67.0 in | Wt 184.0 lb

## 2016-11-26 DIAGNOSIS — L4050 Arthropathic psoriasis, unspecified: Secondary | ICD-10-CM

## 2016-11-26 DIAGNOSIS — Z8709 Personal history of other diseases of the respiratory system: Secondary | ICD-10-CM

## 2016-11-26 DIAGNOSIS — Z8639 Personal history of other endocrine, nutritional and metabolic disease: Secondary | ICD-10-CM

## 2016-11-26 DIAGNOSIS — L408 Other psoriasis: Secondary | ICD-10-CM | POA: Diagnosis not present

## 2016-11-26 DIAGNOSIS — Z8679 Personal history of other diseases of the circulatory system: Secondary | ICD-10-CM

## 2016-11-26 DIAGNOSIS — Z13820 Encounter for screening for osteoporosis: Secondary | ICD-10-CM

## 2016-11-26 DIAGNOSIS — M0579 Rheumatoid arthritis with rheumatoid factor of multiple sites without organ or systems involvement: Secondary | ICD-10-CM | POA: Diagnosis not present

## 2016-11-26 DIAGNOSIS — J8410 Pulmonary fibrosis, unspecified: Secondary | ICD-10-CM

## 2016-11-26 DIAGNOSIS — Z8719 Personal history of other diseases of the digestive system: Secondary | ICD-10-CM | POA: Diagnosis not present

## 2016-11-26 DIAGNOSIS — Z79899 Other long term (current) drug therapy: Secondary | ICD-10-CM | POA: Diagnosis not present

## 2016-11-26 DIAGNOSIS — I2510 Atherosclerotic heart disease of native coronary artery without angina pectoris: Secondary | ICD-10-CM | POA: Diagnosis not present

## 2016-11-26 LAB — CBC WITH DIFFERENTIAL/PLATELET
Basophils Absolute: 110 cells/uL (ref 0–200)
Basophils Relative: 0.6 %
EOS ABS: 202 {cells}/uL (ref 15–500)
Eosinophils Relative: 1.1 %
HCT: 43.8 % (ref 38.5–50.0)
Hemoglobin: 14.2 g/dL (ref 13.2–17.1)
Lymphs Abs: 5170 cells/uL — ABNORMAL HIGH (ref 850–3900)
MCH: 26.9 pg — AB (ref 27.0–33.0)
MCHC: 32.4 g/dL (ref 32.0–36.0)
MCV: 83.1 fL (ref 80.0–100.0)
MPV: 12.6 fL — ABNORMAL HIGH (ref 7.5–12.5)
Monocytes Relative: 7.5 %
NEUTROS PCT: 62.7 %
Neutro Abs: 11537 cells/uL — ABNORMAL HIGH (ref 1500–7800)
PLATELETS: 208 10*3/uL (ref 140–400)
RBC: 5.27 10*6/uL (ref 4.20–5.80)
RDW: 16.4 % — AB (ref 11.0–15.0)
TOTAL LYMPHOCYTE: 28.1 %
WBC: 18.4 10*3/uL — AB (ref 3.8–10.8)
WBCMIX: 1380 {cells}/uL — AB (ref 200–950)

## 2016-11-26 LAB — COMPLETE METABOLIC PANEL WITH GFR
AG Ratio: 1.3 (calc) (ref 1.0–2.5)
ALBUMIN MSPROF: 3.5 g/dL — AB (ref 3.6–5.1)
ALT: 19 U/L (ref 9–46)
AST: 19 U/L (ref 10–35)
Alkaline phosphatase (APISO): 64 U/L (ref 40–115)
BUN / CREAT RATIO: 17 (calc) (ref 6–22)
BUN: 9 mg/dL (ref 7–25)
CALCIUM: 9.2 mg/dL (ref 8.6–10.3)
CO2: 31 mmol/L (ref 20–32)
CREATININE: 0.54 mg/dL — AB (ref 0.70–1.25)
Chloride: 104 mmol/L (ref 98–110)
GFR, EST AFRICAN AMERICAN: 130 mL/min/{1.73_m2} (ref >=60)
GFR, EST NON AFRICAN AMERICAN: 112 mL/min/{1.73_m2} (ref >=60)
GLOBULIN: 2.6 g/dL (ref 1.9–3.7)
Glucose, Bld: 95 mg/dL (ref 65–99)
Potassium: 3.9 mmol/L (ref 3.5–5.3)
Sodium: 143 mmol/L (ref 135–146)
TOTAL PROTEIN: 6.1 g/dL (ref 6.1–8.1)
Total Bilirubin: 0.5 mg/dL (ref 0.2–1.2)

## 2016-11-26 NOTE — Telephone Encounter (Signed)
Spoke to patient at visit today. He is required to submit a renewal verification application to [employer]. Application was completed and signed with patient. Application was submitted to foundation. Patient will still be required to apply for assistance for 2019. Patient voices understanding and denies any questions at this time.   Will update once we receive a response.   Ramez Arrona, [place], CPhT 9:37 AM

## 2016-11-26 NOTE — Patient Instructions (Signed)
Standing Labs We placed an order today for your standing lab work.    Please come back and get your standing labs in December and every 3 months  We have open lab Monday through Friday from 8:30-11:30 AM and 1:30-4 PM at the office of Dr. Alli Jasmer.   The office is located at [address], [address], [place], [place] [address] No appointment is necessary.   Labs are drawn by [facility].  You may receive a bill from [facility] for your lab work. If you have any questions regarding directions or hours of operation,  please call [phone].    

## 2016-11-29 ENCOUNTER — Other Ambulatory Visit: Payer: Self-pay

## 2016-11-29 MED ORDER — CLOPIDOGREL BISULFATE 75 MG PO TABS: 75.0000 mg | ORAL_TABLET | Freq: Every day | ORAL | 2 refills | Status: DC

## 2016-12-04 ENCOUNTER — Ambulatory Visit (INDEPENDENT_AMBULATORY_CARE_PROVIDER_SITE_OTHER): Payer: Medicare Other | Admitting: Cardiovascular Disease

## 2016-12-04 ENCOUNTER — Encounter: Payer: Self-pay | Admitting: Cardiovascular Disease

## 2016-12-04 DIAGNOSIS — I255 Ischemic cardiomyopathy: Secondary | ICD-10-CM | POA: Diagnosis not present

## 2016-12-04 DIAGNOSIS — I10 Essential (primary) hypertension: Secondary | ICD-10-CM

## 2016-12-04 DIAGNOSIS — Z9861 Coronary angioplasty status: Secondary | ICD-10-CM | POA: Diagnosis not present

## 2016-12-04 DIAGNOSIS — I483 Typical atrial flutter: Secondary | ICD-10-CM

## 2016-12-04 DIAGNOSIS — E7800 Pure hypercholesterolemia, unspecified: Secondary | ICD-10-CM

## 2016-12-04 DIAGNOSIS — I2510 Atherosclerotic heart disease of native coronary artery without angina pectoris: Secondary | ICD-10-CM

## 2016-12-04 NOTE — Assessment & Plan Note (Signed)
History of CAD status post circumflex infarct 02/22/15 with drug-eluting T stenting of the AV groove circumflex and proximal first obtuse marginal branch. He did have 80% mid nondominant RCA and 80% proximal LAD with mild LV dysfunction. A Myoview performed 05/24/15 was low risk and nonischemic suggesting that his residual disease was not physiologically significant. He denies chest pain or shortness of breath. He remains on dual antiplatelet therapy.

## 2016-12-04 NOTE — Assessment & Plan Note (Signed)
History of essential hypertension blood pressure measured today at 136/50. He is on losartan and carvedilol. Continue current meds at current dosing.

## 2016-12-04 NOTE — Assessment & Plan Note (Signed)
History of hyperlipidemia on statin therapy followed by his PCP 

## 2016-12-04 NOTE — Assessment & Plan Note (Signed)
History of ischemic cardiomyopathy with ejection fraction 35-40% range by 2-D echocardiogram 07/05/15 on appropriate medications.

## 2016-12-04 NOTE — Progress Notes (Signed)
12/04/2016 [name]   Dec 13, 1955  [id number]  Primary Physician Dione Housekeeper, MD Primary Cardiologist: Lorretta Harp MD Lupe Carney, [place]  HPI:  Curtis Flores is a 61 y.o. male thin-appearing married African-American male father of 2, grandfather to 3 grandchildren accompanied by his wife today. I last saw him in the office 05/09/15.His primary care physician is Dr. Edrick Oh in medicine. I recently saw him in the office 03/07/15. He has a remote history of tobacco abuse as well as a history of hypertension and hyperlipidemia rheumatoid arthritis. He had an inferolateral STEMI on 02/22/15. I took him to the Cath Lab and stented his proximal dominant circumflex and first obtuse marginal branch with drug-eluting stents (T stenting). He had residual 90% stenosis and a mid nondominant RCA 80% proximal LAD stenosis. His EF was 35-40% by 2-D echo. Subsequent Myoview stress testing performed soon thereafter was nonischemic.[name] There is a history of pulmonary fibrosis however as well. When I saw him in the office on 04/05/15 he was significantly dyspneic and tachycardic. I initially thought that this was sinus tachycardia however he was admitted to the hospital 2 days later with progressive dyspnea and he was diagnosed with atrial flutter. He did have a wide complex tachycardia prior to that which converted with venous adenosine. He UNDERWENT transesophageal echo/DC cardioversion successfully was has resulted in marked improvement in his symptoms. His EF at that time was 20%. He was put on oral anticoagulations and Brilenta was changed to Plavix. He also underwent a flutter ablation by Dr. Rayann Heman on 05/30/15 and was taken off his amiodarone. Since I saw him a year and half ago he's remained fairly stable denying chest pain or shortness of breath.   Current Meds  Medication Sig  . Abatacept (ORENCIA CLICKJECT) 034 MG/ML SOAJ Inject 125 mg into the skin once a week.  [name] atorvastatin (LIPITOR) 80 MG  tablet Take 1 tablet (80 mg total) by mouth daily at 6 PM. NEED OV.  . azaTHIOprine (IMURAN) 50 MG tablet TAKE 1 TABLET (50 MG TOTAL) BY MOUTH 2 (TWO) TIMES DAILY.  . carvedilol (COREG) 12.5 MG tablet TAKE 1 AND 1/2 TABLETS BY MOUTH TWICE DAILY  . clopidogrel (PLAVIX) 75 MG tablet Take 1 tablet (75 mg total) by mouth daily.  . furosemide (LASIX) 40 MG tablet Take 1 tablet (40 mg total) by mouth 2 (two) times daily.  [name] losartan (COZAAR) 100 MG tablet TAKE 1 TABLET BY MOUTH EVERY DAY  . pantoprazole (PROTONIX) 40 MG tablet TAKE 1 TABLET (40 MG TOTAL) BY MOUTH DAILY.  . pantoprazole (PROTONIX) 40 MG tablet TAKE 1 TABLET BY MOUTH TWICE A DAY BEFORE A MEAL  . predniSONE (DELTASONE) 5 MG tablet 4 tabs x 4d, 3 tabs x 4d, 2 tabs x 4d, 1 tab x 4d, 1/2 tab x 4d  . predniSONE (DELTASONE) 5 MG tablet 4 tab q am x7days, 3 tab q am x 2weeks, 2 tabs q am x2wks, 1 1/2  tab q am x 2 wks ,then one daily  . ranitidine (ZANTAC) 300 MG tablet Take by mouth.     No Known Allergies  Social History   Social History  . Marital status: Married    Spouse name: N/A  . Number of children: 2  . Years of education: N/A   Occupational History  . 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 Smoking status: Former Smoker    Packs/day: 1.00    Years: 30.00    Types: Cigarettes    Quit date: 03/21/2003  . Smokeless tobacco: Never Used  . Alcohol use No     Comment: H/O case of beer weekly x 20 years, quiting in 2000-ish.  . Drug use: No     Comment: H/O marijuana use many years ago.  [name] Sexual activity: Yes    Birth control/ protection: None   Other Topics Concern  . Not on file   Social History Narrative  . No narrative on file     Review of Systems: General: negative for chills, fever, night sweats or weight changes.  Cardiovascular: negative for chest pain, dyspnea on exertion, edema, orthopnea, palpitations, paroxysmal  nocturnal dyspnea or shortness of breath Dermatological: negative for rash Respiratory: negative for cough or wheezing Urologic: negative for hematuria Abdominal: negative for nausea, vomiting, diarrhea, bright red blood per rectum, melena, or hematemesis Neurologic: negative for visual changes, syncope, or dizziness All other systems reviewed and are otherwise negative except as noted above.    Blood pressure (!) 136/50, pulse 62, height 5\' 7"  (1.702 m), weight 184 lb (83.5 kg).  General appearance: alert and no distress Neck: no adenopathy, no carotid bruit, no JVD, supple, symmetrical, trachea midline and thyroid not enlarged, symmetric, no tenderness/mass/nodules Lungs: clear to auscultation bilaterally Heart: regular rate and rhythm, S1, S2 normal, no murmur, click, rub or gallop Extremities: extremities normal, atraumatic, no cyanosis or edema Pulses: 2+ and symmetric Skin: Skin color, texture, turgor normal. No rashes or lesions Neurologic: Alert and oriented X 3, normal strength and tone. Normal symmetric reflexes. Normal coordination and gait  EKG not performed today.  ASSESSMENT AND PLAN:   Hypertension History of essential hypertension blood pressure measured today at 136/50. He is on losartan and carvedilol. Continue current meds at current dosing.  Hyperlipidemia History of hyperlipidemia on statin therapy followed by his PCP  CAD S/P percutaneous coronary angioplasty History of CAD status post circumflex infarct 02/22/15 with drug-eluting T stenting of the AV groove circumflex and proximal first obtuse marginal branch. He did have 80% mid nondominant RCA and 80% proximal LAD with mild LV dysfunction. A Myoview performed 05/24/15 was low risk and nonischemic suggesting that his residual disease was not physiologically significant. He denies chest pain or shortness of breath. He remains on dual antiplatelet therapy.  Atrial flutter ([place]) History of a flutter status post  RF ablation performed by Dr. Rayann Heman 05/30/15. His amiodarone and subsequently that was discontinued. He has been doing well since.  Ischemic cardiomyopathy History of ischemic cardiomyopathy with ejection fraction 35-40% range by 2-D echocardiogram 07/05/15 on appropriate medications.      Lorretta Harp MD FACP,FACC,FAHA, FSCAI 12/04/2016 10:30 AM

## 2016-12-04 NOTE — Patient Instructions (Signed)

## 2016-12-04 NOTE — Assessment & Plan Note (Signed)
History of a flutter status post RF ablation performed by Dr. Rayann Heman 05/30/15. His amiodarone and subsequently that was discontinued. He has been doing well since.

## 2016-12-06 ENCOUNTER — Other Ambulatory Visit: Payer: Self-pay

## 2016-12-06 MED ORDER — ATORVASTATIN CALCIUM 80 MG PO TABS: 80.0000 mg | ORAL_TABLET | Freq: Every day | ORAL | 3 refills | Status: DC

## 2016-12-08 ENCOUNTER — Other Ambulatory Visit: Payer: Self-pay | Admitting: Rheumatology

## 2016-12-11 ENCOUNTER — Ambulatory Visit: Payer: Medicare Other | Admitting: Rheumatology

## 2016-12-11 NOTE — Telephone Encounter (Signed)
Patient has not requested this medication and does not need refill at this time.

## 2016-12-16 ENCOUNTER — Other Ambulatory Visit: Payer: Self-pay | Admitting: *Deleted

## 2016-12-16 DIAGNOSIS — D508 Other iron deficiency anemias: Secondary | ICD-10-CM

## 2016-12-16 MED ORDER — AZATHIOPRINE 50 MG PO TABS: 50.0000 mg | ORAL_TABLET | Freq: Two times a day (BID) | ORAL | 0 refills | Status: DC

## 2016-12-16 NOTE — Telephone Encounter (Signed)
Refill request received via fax  Last Visit: 11/26/16 Next Visit: 04/02/17 Labs: 11/26/16 stable  Okay to refill per Dr. Estanislado Pandy

## 2016-12-17 ENCOUNTER — Encounter: Payer: Self-pay | Admitting: Rheumatology

## 2016-12-17 DIAGNOSIS — M8589 Other specified disorders of bone density and structure, multiple sites: Secondary | ICD-10-CM | POA: Diagnosis not present

## 2016-12-17 DIAGNOSIS — Z13820 Encounter for screening for osteoporosis: Secondary | ICD-10-CM | POA: Diagnosis not present

## 2016-12-18 ENCOUNTER — Telehealth: Payer: Self-pay | Admitting: Rheumatology

## 2016-12-18 NOTE — Telephone Encounter (Signed)
ok 

## 2016-12-18 NOTE — Telephone Encounter (Signed)
Called patient in regards to a prednisone taper refill request that was received. Patient states he does feel as though he could use a prednisone taper. He is experiencing pain in his hands, feet and joints. His hands have started to swell and patient states he feels as though the weather is contributing to his pain.  Ok to refill prednisone taper?

## 2016-12-19 ENCOUNTER — Other Ambulatory Visit: Payer: Self-pay | Admitting: Rheumatology

## 2016-12-19 MED ORDER — PREDNISONE 5 MG PO TABS: ORAL_TABLET | ORAL | 0 refills | Status: DC

## 2016-12-19 NOTE — Telephone Encounter (Signed)
Prescription sent to pharmacy and patient advised.

## 2016-12-20 ENCOUNTER — Telehealth: Payer: Self-pay

## 2016-12-20 ENCOUNTER — Encounter: Payer: Self-pay | Admitting: *Deleted

## 2016-12-20 MED ORDER — PREDNISONE 5 MG PO TABS: ORAL_TABLET | ORAL | 0 refills | Status: DC

## 2016-12-20 NOTE — Telephone Encounter (Signed)
Called patient to advise prescription has been sent to the local pharmacy.

## 2016-12-20 NOTE — Telephone Encounter (Signed)
Patient would like a Rx refill for Prednisone.  Cb# is [phone].  Please advise.  Thank You.

## 2016-12-20 NOTE — Addendum Note (Signed)
Addended by: Carole Binning on: 12/20/2016 09:29 AM   Modules accepted: Orders

## 2017-01-01 DIAGNOSIS — H2513 Age-related nuclear cataract, bilateral: Secondary | ICD-10-CM | POA: Diagnosis not present

## 2017-01-01 DIAGNOSIS — H52223 Regular astigmatism, bilateral: Secondary | ICD-10-CM | POA: Diagnosis not present

## 2017-01-01 DIAGNOSIS — H353132 Nonexudative age-related macular degeneration, bilateral, intermediate dry stage: Secondary | ICD-10-CM | POA: Diagnosis not present

## 2017-01-01 DIAGNOSIS — H5203 Hypermetropia, bilateral: Secondary | ICD-10-CM | POA: Diagnosis not present

## 2017-01-07 ENCOUNTER — Ambulatory Visit: Payer: Medicare Other | Admitting: Internal Medicine

## 2017-01-14 ENCOUNTER — Telehealth: Payer: Self-pay

## 2017-01-14 NOTE — Telephone Encounter (Signed)
Patient states he is having pain all over. Patient is taking Orencia like he is suppose to . Patient is due to go to his Sister in [place]. Patient is requesting a prescription for Prednisone. Please advise.

## 2017-01-14 NOTE — Telephone Encounter (Signed)
Patient would like Rx refill for Prednisone.  Stated that he is in pain.  Cb# is [phone].  Please advise.  Thank You.

## 2017-01-14 NOTE — Telephone Encounter (Signed)
Ok to give pred taper as rxd on 12/20/2016.

## 2017-01-15 ENCOUNTER — Encounter (HOSPITAL_COMMUNITY): Payer: Medicare Other | Attending: Oncology

## 2017-01-15 ENCOUNTER — Ambulatory Visit (HOSPITAL_COMMUNITY): Payer: Medicare Other

## 2017-01-15 DIAGNOSIS — D72823 Leukemoid reaction: Secondary | ICD-10-CM

## 2017-01-15 DIAGNOSIS — D72829 Elevated white blood cell count, unspecified: Secondary | ICD-10-CM | POA: Diagnosis not present

## 2017-01-15 LAB — COMPREHENSIVE METABOLIC PANEL
ALBUMIN: 3.3 g/dL — AB (ref 3.5–5.0)
ALT: 16 U/L — ABNORMAL LOW (ref 17–63)
ANION GAP: 6 (ref 5–15)
AST: 22 U/L (ref 15–41)
Alkaline Phosphatase: 67 U/L (ref 38–126)
BUN: 12 mg/dL (ref 6–20)
CHLORIDE: 99 mmol/L — AB (ref 101–111)
CO2: 32 mmol/L (ref 22–32)
Calcium: 9.2 mg/dL (ref 8.9–10.3)
Creatinine, Ser: 0.63 mg/dL (ref 0.61–1.24)
GFR calc Af Amer: >60 mL/min (ref >60)
GFR calc non Af Amer: >60 mL/min (ref >60)
GLUCOSE: 112 mg/dL — AB (ref 65–99)
POTASSIUM: 4 mmol/L (ref 3.5–5.1)
SODIUM: 137 mmol/L (ref 135–145)
TOTAL PROTEIN: 7.4 g/dL (ref 6.5–8.1)
Total Bilirubin: 0.5 mg/dL (ref 0.3–1.2)

## 2017-01-15 LAB — CBC WITH DIFFERENTIAL/PLATELET
BASOS ABS: 0 10*3/uL (ref 0.0–0.1)
BASOS PCT: 0 %
EOS ABS: 0.3 10*3/uL (ref 0.0–0.7)
Eosinophils Relative: 2 %
HEMATOCRIT: 42.3 % (ref 39.0–52.0)
Hemoglobin: 13.5 g/dL (ref 13.0–17.0)
Lymphocytes Relative: 25 %
Lymphs Abs: 3.5 10*3/uL (ref 0.7–4.0)
MCH: 27.4 pg (ref 26.0–34.0)
MCHC: 31.9 g/dL (ref 30.0–36.0)
MCV: 85.8 fL (ref 78.0–100.0)
MONO ABS: 1.4 10*3/uL — AB (ref 0.1–1.0)
MONOS PCT: 10 %
NEUTROS ABS: 8.6 10*3/uL — AB (ref 1.7–7.7)
Neutrophils Relative %: 63 %
PLATELETS: 280 10*3/uL (ref 150–400)
RBC: 4.93 MIL/uL (ref 4.22–5.81)
RDW: 14.6 % (ref 11.5–15.5)
WBC: 13.7 10*3/uL — ABNORMAL HIGH (ref 4.0–10.5)

## 2017-01-15 MED ORDER — PREDNISONE 5 MG PO TABS: ORAL_TABLET | ORAL | 0 refills | Status: DC

## 2017-01-15 NOTE — Addendum Note (Signed)
Addended by: Earnestine Mealing on: 01/15/2017 02:59 PM   Modules accepted: Orders

## 2017-01-15 NOTE — Telephone Encounter (Signed)
Please call into [facility] in [place]. Patient hurting.

## 2017-01-15 NOTE — Telephone Encounter (Signed)
Prescription sent to the pharmacy.

## 2017-01-21 ENCOUNTER — Encounter: Payer: Self-pay | Admitting: Internal Medicine

## 2017-01-21 ENCOUNTER — Other Ambulatory Visit: Payer: Self-pay | Admitting: Cardiovascular Disease

## 2017-01-21 ENCOUNTER — Ambulatory Visit (INDEPENDENT_AMBULATORY_CARE_PROVIDER_SITE_OTHER): Payer: Medicare Other | Admitting: Internal Medicine

## 2017-01-21 VITALS — BP 132/65 | HR 48 | Temp 97.8°F | Ht 67.0 in | Wt 190.0 lb

## 2017-01-21 DIAGNOSIS — K2100 Gastro-esophageal reflux disease with esophagitis, without bleeding: Secondary | ICD-10-CM

## 2017-01-21 DIAGNOSIS — I255 Ischemic cardiomyopathy: Secondary | ICD-10-CM | POA: Diagnosis not present

## 2017-01-21 DIAGNOSIS — D508 Other iron deficiency anemias: Secondary | ICD-10-CM

## 2017-01-21 NOTE — Progress Notes (Signed)
Primary Care Physician:  Dione Housekeeper, MD Primary Gastroenterologist:  Dr. Gala Romney Pre-Procedure History & Physical: HPI:  Curtis Flores is a 61 y.o. male here for follow-up of GERD and iron deficiency anemia. Lysbeth Galas lesions seen on prior EGD. Biopsies esophagus and stomach revealed benign inflammation. No H pylori or Barrett's.  Doing well on Protonix 40 mg daily. Hemoglobin has normalized last by 13.5.  Small bowel capsule study normal.  Overall, doing well from a GI standpoint. He continues taking Plavix.  Past Medical History:  Diagnosis Date  . Abnormal CT scan, stomach   . Chronic systolic dysfunction of left ventricle   . Coronary artery disease    lateral STEMI 02/22/15  . GERD (gastroesophageal reflux disease)   . Hyperlipidemia   . Hypertension   . Ischemic cardiomyopathy   . LBBB (left bundle branch block)   . Leukocytosis    followed by hematology, reactive  . Lymphadenopathy   . Myocardial infarction ([place]) 02/2015  . Psoriasis 2003  . Psoriatic arthritis ([place])   . Pulmonary fibrosis ([place])   . Rheumatoid arthritis(714.0) 2012  . Typical atrial flutter [facility])     Past Surgical History:  Procedure Laterality Date  . A-Flutter N/A 05/30/2015   Performed by [name], MD at [facility]  . CARDIOVERSION N/A 04/12/2015   Performed by Larey Dresser, MD at [place]  . COLONOSCOPY  07/01/2003   WER:[id number] colonic mucosa except for the proximal right colon in the area of ICV which was not seen completely due to inadequate bowel prep. followed with ACBE which was normal.   . COLONOSCOPY N/A 08/24/2015   Performed by Daneil Dolin, MD at [place]  . Coronary Stent Intervention N/A 02/22/2015   Performed by Lorretta Harp, MD at [place]  . ESOPHAGOGASTRODUODENOSCOPY (EGD) N/A 08/24/2015   Performed by Daneil Dolin, MD at [facility]  . GIVENS CAPSULE STUDY N/A 04/17/2016   Performed by Daneil Dolin, MD at [place]  .  Left Heart Cath and Coronary Angiography N/A 02/22/2015   Performed by Lorretta Harp, MD at [place] [facility]  . TRANSESOPHAGEAL ECHOCARDIOGRAM (TEE) N/A 04/12/2015   Performed by Larey Dresser, MD at [place]    Prior to Admission medications   Medication Sig Start Date End Date Taking? Authorizing Provider  Abatacept (ORENCIA CLICKJECT) 676 MG/ML SOAJ Inject 125 mg into the skin once a week. 09/19/16  Yes Deveshwar, Abel Presto, MD  atorvastatin (LIPITOR) 80 MG tablet Take 1 tablet (80 mg total) by mouth daily at 6 PM. 12/06/16  Yes Lorretta Harp, MD  azaTHIOprine (IMURAN) 50 MG tablet Take 1 tablet (50 mg total) by mouth 2 (two) times daily. 12/16/16  Yes Deveshwar, Abel Presto, MD  carvedilol (COREG) 12.5 MG tablet TAKE 1 AND 1/2 TABLETS BY MOUTH TWICE DAILY Patient taking differently: TAKE 1 AND 1/2 TABLETS BY MOUTH TWICE DAILY. Takes 1 tablet twice daily 10/18/16  Yes Lorretta Harp, MD  clopidogrel (PLAVIX) 75 MG tablet Take 1 tablet (75 mg total) by mouth daily. 11/29/16  Yes Lorretta Harp, MD  furosemide (LASIX) 40 MG tablet Take 1 tablet (40 mg total) by mouth 2 (two) times daily. 11/26/16  Yes Lorretta Harp, MD  losartan (COZAAR) 100 MG tablet TAKE 1 TABLET BY MOUTH EVERY DAY 10/09/16  Yes Allred, Jeneen Rinks, MD  pantoprazole (PROTONIX) 40 MG tablet TAKE 1 TABLET (40 MG TOTAL)  BY MOUTH DAILY. Patient taking differently: TAKE 1 TABLET (40 MG TOTAL) BY MOUTH DAILY. Sometimes takes twice daily 03/07/16  Yes Lorretta Harp, MD  pantoprazole (PROTONIX) 40 MG tablet TAKE 1 TABLET BY MOUTH TWICE A DAY BEFORE A MEAL Patient taking differently: TAKE 1 TABLET BY MOUTH TWICE A DAY BEFORE A MEAL. Sometimes takes twice daily 11/16/16  Yes Carlis Stable, NP  predniSONE (DELTASONE) 5 MG tablet 4 tabs x 4d, 3 tabs x 4d, 2 tabs x 4d, 1 tab x 4d, 1/2 tab x 4d 01/15/17  Yes Deveshwar, Abel Presto, MD  predniSONE (DELTASONE) 5 MG tablet 4 tab q am x7days, 3 tab q am x 2weeks, 2 tabs q am x2wks, 1 1/2  tab q  am x 2 wks ,then one daily Patient not taking: Reported on 01/21/2017 11/01/16   Bo Merino, MD  ranitidine (ZANTAC) 300 MG tablet Take by mouth. 01/15/16 01/14/17  [provider]    Allergies as of 01/21/2017  . (No Known Allergies)    Family History  Problem Relation Age of Onset  . Hypertension Mother   . Colon cancer Neg Hx     Social History   Socioeconomic History  . Marital status: Married    Spouse name: Not on file  . Number of children: 2  . Years of education: Not on file  . Highest education level: Not on file  Social Needs  . Financial resource strain: Not on file  . Food insecurity - worry: Not on file  . Food insecurity - inability: Not on file  . Transportation needs - medical: Not on file  . Transportation needs - non-medical: Not on file  Occupational History  . 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 Smoking status: Former Smoker    Packs/day: 1.00    Years: 30.00    Pack years: 30.00    Types: Cigarettes    Last attempt to quit: 03/21/2003    Years since quitting: 13.8  . Smokeless tobacco: Never Used  Substance and Sexual Activity  . Alcohol use: No    Alcohol/week: 0.0 oz    Comment: H/O case of beer weekly x 20 years, quiting in 2000-ish.  . Drug use: No    Comment: H/O marijuana use many years ago.  [name] Sexual activity: Yes    Birth control/protection: None  Other Topics Concern  . Not on file  Social History Narrative  . Not on file    Review of Systems: See HPI, otherwise negative ROS  Physical Exam: BP 132/65   Pulse (!) 48   Temp 97.8 F (36.6 C) (Oral)   Ht 5\' 7"  (1.702 m)   Wt 190 lb (86.2 kg)   BMI 29.76 kg/m  General:   Alert,   pleasant and cooperative in NAD Neck:  Supple; no masses or thyromegaly. No significant cervical adenopathy. Lungs:  Clear throughout to auscultation.   No wheezes, crackles, or rhonchi.  No acute distress. Heart:  Regular rate and rhythm; no murmurs, clicks, rubs,  or gallops. Abdomen: Non-distended, normal bowel sounds.  Soft and nontender without appreciable mass or hepatosplenomegaly.  Pulses:  Normal pulses noted. Extremities:  Without clubbing or edema.  Impression: Pleasant 61 year old gentleman with history of GERD iron deficiency anemia. Lysbeth Galas lesion seen on prior EGD. Otherwise negative EGD, colonoscopy and small bowel capsule study. No longer anemic. Doing well on  the once daily PPI therapy.  Recommendations: Continue Protonix 40 mg daily  GERD information  Office visit in 1 year and as needed     Notice: This dictation was prepared with Dragon dictation along with smaller phrase technology. Any transcriptional errors that result from this process are unintentional and may not be corrected upon review.

## 2017-01-21 NOTE — Patient Instructions (Signed)
Continue Protonix 40 mg daily  GERD information  Office visit in 1 year and as needed

## 2017-01-21 NOTE — Telephone Encounter (Signed)
REFILL 

## 2017-01-21 NOTE — Telephone Encounter (Signed)
Called foundation to check the status of patient's application. Spoke to Ava who states that the application will be processed at the beginning of the year (1st and 2nd week). Will update once we receive a response.   Sequoya Hogsett, [place], CPhT 2:35 PM

## 2017-01-30 ENCOUNTER — Encounter (HOSPITAL_BASED_OUTPATIENT_CLINIC_OR_DEPARTMENT_OTHER): Payer: Medicare Other | Admitting: Oncology

## 2017-01-30 ENCOUNTER — Encounter (HOSPITAL_COMMUNITY): Payer: Self-pay | Admitting: Oncology

## 2017-01-30 ENCOUNTER — Other Ambulatory Visit: Payer: Self-pay

## 2017-01-30 VITALS — BP 150/52 | HR 55 | Temp 98.9°F | Resp 18 | Wt 187.6 lb

## 2017-01-30 DIAGNOSIS — D72829 Elevated white blood cell count, unspecified: Secondary | ICD-10-CM

## 2017-01-30 NOTE — Patient Instructions (Signed)
[facility] Discharge Instructions  RECOMMENDATIONS MADE BY THE CONSULTANT AND ANY TEST RESULTS WILL BE SENT TO YOUR REFERRING PHYSICIAN.  You were seen today by Dr. Louise Zhou Follow up in 6 months with lab work   Thank you for choosing [facility] at [facility] to provide your oncology and hematology care.  To afford each patient quality time with our provider, please arrive at least 15 minutes before your scheduled appointment time.    If you have a lab appointment with the Cancer Center please come in thru the  Main Entrance and check in at the main information desk  You need to re-schedule your appointment should you arrive 10 or more minutes late.  We strive to give you quality time with our providers, and arriving late affects you and other patients whose appointments are after yours.  Also, if you no show three or more times for appointments you may be dismissed from the clinic at the providers discretion.     Again, thank you for choosing [facility].  Our hope is that these requests will decrease the amount of time that you wait before being seen by our physicians.       _____________________________________________________________  Should you have questions after your visit to [facility], please contact our office at [phone] between the hours of 8:30 a.m. and 4:30 p.m.  Voicemails left after 4:30 p.m. will not be returned until the following business day.  For prescription refill requests, have your pharmacy contact our office.       Resources For Cancer Patients and their Caregivers ? American Cancer Society: Can assist with transportation, wigs, general needs, runs Look Good Feel Better.        1-888-227-6333 ? Cancer Care: Provides financial assistance, online support groups, medication/co-pay assistance.  1-800-813-HOPE (4673) ? [facility] Assists  [place] cancer patients and their families through emotional , educational and financial support.  336-427-4357 ? [place] DSS Where to apply for food stamps, Medicaid and utility assistance. 336-342-1394 ? [facility]: Transportation to medical appointments. 336-347-2287 ? Social Security Administration: May apply for disability if have a Stage IV cancer. 336-342-7796 1-800-772-1213 ? [place] Aging, Disability and Transit Services: Assists with nutrition, care and transit needs. 336-349-2343  Cancer Center Support Programs: @10RELATIVEDAYS@ > Cancer Support Group  2nd Tuesday of the month 1pm-2pm, Journey Room  > Creative Journey  3rd Tuesday of the month 1130am-1pm, Journey Room  > Look Good Feel Better  1st Wednesday of the month 10am-12 noon, Journey Room (Call American Cancer Society to register 1-800-395-5775)    

## 2017-01-30 NOTE — Progress Notes (Signed)
[facility] Hematology/Oncology Progress Note  Name: Curtis Flores      MRN: [id number]    Date: 01/30/2017 Time:3:09 PM   REFERRING PHYSICIAN:  Dr. Estanislado Pandy  REASON FOR CONSULT:  Leukocytosis   DIAGNOSIS:  Leukocytosis with neutrophilia and monocytosis- reactive  HISTORY OF PRESENT ILLNESS:   Curtis Flores is a pleasant 61 yo black American man with a past medical history significant for rheumatoid arthritis, pulmonary fibrosis, psoriasis, HTN, hypercholesterolemia, lymphadenopathy in the mediastinum and hilar area consistent to reactive interstitial lung disease, and history of leukocytosis that was worked up by Dr. Sherryl Manges ([place]) in 2013 and was felt to be reactive.  The patient remembers his work-up with Dr. Lamonte Sakai in 2013.  He remembers the bone marrow biopsy being negative for any malignancy findings.  Elevated WBC count is felt to be reactive.   Peripheral flow cytometry in 2016 was negative.  He presents today for continued follow-up of his chronic leukocytosis.  His most recent blood work from 01/15/2017 demonstrates WBC of 13.7 K with a neutrophilic predominance of 1.2R, monocytes also elevated at 1.4K.  He states that he has been on a prednisone taper for his RA symptoms in his hands.  He states his hands have been very swollen and painful.  He denies any recent infections.  He denies any chest pain, shortness breath, abdominal pain, focal weakness, nausea, vomiting, diarrhea.  PAST MEDICAL HISTORY:   Past Medical History:  Diagnosis Date  . Abnormal CT scan, stomach   . Chronic systolic dysfunction of left ventricle   . Coronary artery disease    lateral STEMI 02/22/15  . GERD (gastroesophageal reflux disease)   . Hyperlipidemia   . Hypertension   . Ischemic cardiomyopathy   . LBBB (left bundle branch block)   . Leukocytosis    followed by hematology, reactive  . Lymphadenopathy   . Myocardial infarction ([place]) 02/2015  . Psoriasis 2003  . Psoriatic  arthritis ([place])   . Pulmonary fibrosis ([place])   . Rheumatoid arthritis(714.0) 2012  . Typical atrial flutter (H[facility])     ALLERGIES: No Known Allergies    MEDICATIONS: I have reviewed the patient's current medications.    Current Outpatient Medications on File Prior to Visit  Medication Sig Dispense Refill  . Abatacept (ORENCIA CLICKJECT) 975 MG/ML SOAJ Inject 125 mg into the skin once a week. 4 Syringe 2  . atorvastatin (LIPITOR) 80 MG tablet Take 1 tablet (80 mg total) by mouth daily at 6 PM. 90 tablet 3  . azaTHIOprine (IMURAN) 50 MG tablet Take 1 tablet (50 mg total) by mouth 2 (two) times daily. 180 tablet 0  . carvedilol (COREG) 12.5 MG tablet TAKE 1 AND 1/2 TABLETS BY MOUTH TWICE DAILY 90 tablet 11  . clopidogrel (PLAVIX) 75 MG tablet Take 1 tablet (75 mg total) by mouth daily. 90 tablet 2  . furosemide (LASIX) 40 MG tablet Take 1 tablet (40 mg total) by mouth 2 (two) times daily. 180 tablet 2  . losartan (COZAAR) 100 MG tablet TAKE 1 TABLET BY MOUTH EVERY DAY 90 tablet 3  . pantoprazole (PROTONIX) 40 MG tablet TAKE 1 TABLET (40 MG TOTAL) BY MOUTH DAILY. (Patient taking differently: TAKE 1 TABLET (40 MG TOTAL) BY MOUTH DAILY. Sometimes takes twice daily) 30 tablet 11  . pantoprazole (PROTONIX) 40 MG tablet TAKE 1 TABLET BY MOUTH TWICE A DAY BEFORE A MEAL (Patient taking differently: TAKE 1 TABLET BY MOUTH TWICE A  DAY BEFORE A MEAL. Sometimes takes twice daily) 180 tablet 2  . predniSONE (DELTASONE) 5 MG tablet 4 tab q am x7days, 3 tab q am x 2weeks, 2 tabs q am x2wks, 1 1/2  tab q am x 2 wks ,then one daily (Patient not taking: Reported on 01/21/2017) 100 tablet 0  . predniSONE (DELTASONE) 5 MG tablet 4 tabs x 4d, 3 tabs x 4d, 2 tabs x 4d, 1 tab x 4d, 1/2 tab x 4d 42 tablet 0  . ranitidine (ZANTAC) 300 MG tablet Take by mouth.     No current facility-administered medications on file prior to visit.      PAST SURGICAL HISTORY Past Surgical History:  Procedure Laterality Date  .  CARDIAC CATHETERIZATION N/A 02/22/2015   Procedure: Left Heart Cath and Coronary Angiography;  Surgeon: Lorretta Harp, MD;  Location: [place] [facility];  Service: Cardiovascular;  Laterality: N/A;  . CARDIAC CATHETERIZATION N/A 02/22/2015   Procedure: Coronary Stent Intervention;  Surgeon: Lorretta Harp, MD;  Location: [place] [facility];  Service: Cardiovascular;  Laterality: N/A;  . CARDIOVERSION N/A 04/12/2015   Procedure: CARDIOVERSION;  Surgeon: Larey Dresser, MD;  Location: [facility] ENDOSCOPY;  Service: Cardiovascular;  Laterality: N/A;  . COLONOSCOPY  07/01/2003   QMV:HQIONG colonic mucosa except for the proximal right colon in the area of ICV which was not seen completely due to inadequate bowel prep. followed with ACBE which was normal.   . COLONOSCOPY N/A 08/24/2015   Dr. Gala Romney: Normal colon. Next colonoscopy in 10 years.  [name] ELECTROPHYSIOLOGIC STUDY N/A 05/30/2015   Atrial fibrillation ablation by Dr Rayann Heman  . ESOPHAGOGASTRODUODENOSCOPY N/A 08/24/2015   Dr. Gala Romney: Medium-sized hiatal hernia, erosive gastropathy. Cameron lesions. Esophageal mucosa distally suggestive of short segment Barrett's esophagus. Not confirmed on biopsy. Gastric biopsy with minimal chronic inflammation  . GIVENS CAPSULE STUDY N/A 04/17/2016   Procedure: GIVENS CAPSULE STUDY;  Surgeon: Daneil Dolin, MD;  [facility];  Service: Endoscopy;  Laterality: N/A;  Pt to arrive at 8:00 am for 8:30 am appt  . TEE WITHOUT CARDIOVERSION N/A 04/12/2015   Procedure: TRANSESOPHAGEAL ECHOCARDIOGRAM (TEE);  Surgeon: Larey Dresser, MD;  Location: [facility] ENDOSCOPY;  Service: Cardiovascular;  Laterality: N/A;    FAMILY HISTORY: Father passed in his 73's from unknown reason Mother passed at 35 from MI and history of EtOHism He had 5 brothers, all deceased via MVA.  He has 1 son and 1 daughter (41 and 27 respectively).  He has an uncle with RA.  SOCIAL HISTORY:  reports that he quit smoking about 13 years ago. His smoking  use included cigarettes. He has a 30.00 pack-year smoking history. he has never used smokeless tobacco. He reports that he does not drink alcohol or use drugs.  PERFORMANCE STATUS: The patient's performance status is 1 - Symptomatic but completely ambulatory  ROS: Review of Systems  Constitutional: Negative for malaise/fatigue.  HENT: Negative.   Eyes: Negative.   Respiratory: Negative.   Cardiovascular: Negative.   Gastrointestinal: Negative.  Negative for blood in stool.  Genitourinary: Negative.   Musculoskeletal: Negative.        Diffuse joint pains  Skin: Negative.        Psoriasis   Neurological: Negative.   Endo/Heme/Allergies: Negative.   Psychiatric/Behavioral: Negative.   All other systems reviewed and are negative.  14 point review of systems was performed and is negative except as detailed under history of present illness and above  PHYSICAL EXAM:  Most Recent Vital Signs: Blood pressure (!) 150/52, pulse (!) 55, temperature 98.9 F (37.2 C), temperature source Oral, resp. rate 18, weight 187 lb 9.6 oz (85.1 kg), SpO2 98 %.    Physical Exam  Constitutional: He is oriented to person, place, and time and well-developed, well-nourished, and in no distress. No distress.  Wears glasses. Patient was able to get on the exam table without assistance.   HENT:  Head: Normocephalic and atraumatic.  Mouth/Throat: Oropharynx is clear and moist. No oropharyngeal exudate.  Eyes: Conjunctivae and EOM are normal. Pupils are equal, round, and reactive to light. Right eye exhibits no discharge. Left eye exhibits no discharge. No scleral icterus.  Neck: Normal range of motion. Neck supple. No JVD present. No tracheal deviation present. No thyromegaly present.  Cardiovascular: Normal rate, regular rhythm and normal heart sounds. Exam reveals no gallop and no friction rub.  No murmur heard. Pulmonary/Chest: Effort normal and breath sounds normal. No respiratory distress. He has no  wheezes. He has no rales.  Abdominal: Soft. Bowel sounds are normal. He exhibits no distension and no mass. There is no tenderness. There is no rebound and no guarding.  Musculoskeletal: Normal range of motion. He exhibits no edema or tenderness.  Joint pains in his hands and feet.  Lymphadenopathy:    He has no cervical adenopathy.  Neurological: He is alert and oriented to person, place, and time. No cranial nerve deficit. Gait normal.  Skin: Skin is warm and dry. No rash noted. No erythema. No pallor.  Psychiatric: Mood, memory, affect and judgment normal.  Nursing note and vitals reviewed.    LABORATORY DATA:  CBC    Component Value Date/Time   WBC 13.7 (H) 01/15/2017 1014   RBC 4.93 01/15/2017 1014   HGB 13.5 01/15/2017 1014   HGB 14.5 08/12/2011 1256   HCT 42.3 01/15/2017 1014   HCT 44.7 08/12/2011 1256   PLT 280 01/15/2017 1014   PLT 240 08/12/2011 1256   MCV 85.8 01/15/2017 1014   MCV 82.2 08/12/2011 1256   MCH 27.4 01/15/2017 1014   MCHC 31.9 01/15/2017 1014   RDW 14.6 01/15/2017 1014   RDW 14.7 (H) 08/12/2011 1256   LYMPHSABS 3.5 01/15/2017 1014   LYMPHSABS 5.5 (H) 08/12/2011 1256   MONOABS 1.4 (H) 01/15/2017 1014   MONOABS 1.6 (H) 08/12/2011 1256   EOSABS 0.3 01/15/2017 1014   EOSABS 0.2 08/12/2011 1256   BASOSABS 0.0 01/15/2017 1014   BASOSABS 0.1 08/12/2011 1256    Interpretation (BCRAL):  Comment   Comments: (NOTE)  The quantitative RT-PCR assay is negative for the b2a2 and b3a2  (p210) and e1a2 (p190) fusion gene transcripts found in chronic  myelogenous leukemia and Philadelphia positive acute lymphocytic  leukemia. These results do not rule out the presence of low levels of  BCR-ABL1 transcript below the level of detection of this assay, or  the presence of rare BCR-ABL1 transcripts not detected by this assay.        Pathologist smear review  Status: Finalresult Visible to patient:  Not Released Nextappt: 05/09/2015 at 12:20 PM in  Oncology ([facility]) Dx:  Leukocytosis         38moago    Path Review Reviewed By BViolet Baldy M.D.   Comments: 08.08.16  NORMOCYTIC ANEMIA,  MILD LEUKOCYTOSIS.  [facility]formed at [facility]      Specimen Collected: 10/07/14 1:26 PM Last Resulted: 10/10/14 10:31 AM  PATHOLOGY:    04/09/2014  Diagnosis Bone Marrow, Aspirate,Biopsy, and Clot, left posterior iliac crest - NORMOCELLULAR BONE MARROW FOR AGE WITH TRILINEAGE HEMATOPOIESIS. - A FEW SMALL LYMPHOID AGGREGATES PRESENT. - INCREASED IRON STORES.  FLOW CYTOMETRY REPORT INTERPRETATION Interpretation Peripheral Blood Flow Cytometry - PREDOMINANCE OF T LYMPHOCYTES WITH NO ABERRANT PHENOTYPE. - MINOR B-CELL POPULATION IDENTIFIED. Diagnosis Comment: The majority of lymphocytes consists of T cells with no aberrant phenotype. B cells represent a minor population (less than 10% of lymphocytes) with extremely dim staining for surface immunoglobulin light chains that hinder assessment and/or quantitation of clonality. However, an abnormal B-cell phenotype such as expression of CD10 or CD5 is not identified. Clinical correlation is recommended. (BNS:ecj 10/11/2014) Susanne Greenhouse MD Pathologist, Electronic Signature (Case signed 10/11/2014) GROSS AND MICROSCOPIC INFORMATION Specimen Clinical Information leukocytosis Source Peripheral Blood Flow Cytometry Microscopic   ASSESSMENT/PLAN:  Leukocytosis, reactive Negative workup in 2013, including negative bone marrow biopsy. Peripheral flow cytometry 2016 negative for monoclonal B cell population Rheumatoid arthritis Psoriatic arthritis Anemia of chronic disease Hypertension Iron studies c/w anemia chronic disease   I reviewed the patient's laboratory studies from today with him in detail and his leukocytosis is reactive and secondary to his RA.  Leukocytosis is stable, it is actually lower than  previous. Continue observation at this time.  Given that he has underlying RA is more prone to developing lymphoproliferative disorders. Continue observation of his blood counts.  Return to clinic in 6 months for follow-up with repeat CBC and CMP. Continue follow up with his rheumatologist for management of his RA.    Orders Placed This Encounter  Procedures  . CBC with Differential    Standing Status:   Future    Standing Expiration Date:   01/30/2018  . Comprehensive metabolic panel    Standing Status:   Future    Standing Expiration Date:   01/30/2018     All questions were answered. The patient knows to call the clinic with any problems, questions or concerns. We can certainly see the patient much sooner if necessary.   This note is electronically signed by: Twana First, MD   01/30/2017 3:09 PM

## 2017-02-07 ENCOUNTER — Telehealth: Payer: Self-pay | Admitting: *Deleted

## 2017-02-07 NOTE — Telephone Encounter (Signed)
T-Score -1.3 Osteopenia. Resistive Exercises, Calcium and Vitamin D, repeat in 2 years   Attempted to contact the patient and left message for patient to call the office.

## 2017-02-12 ENCOUNTER — Telehealth: Payer: Self-pay

## 2017-02-12 MED ORDER — PANTOPRAZOLE SODIUM 40 MG PO TBEC: 40.0000 mg | DELAYED_RELEASE_TABLET | Freq: Two times a day (BID) | ORAL | 2 refills | Status: DC

## 2017-02-12 NOTE — Telephone Encounter (Signed)
Okay to give prednisone taper as prescribed before

## 2017-02-12 NOTE — Telephone Encounter (Signed)
Patient was returning Andrea's call from Friday 02/07/17.  Patient would like a Rx on Prednisone, stated that hands are swollen.  Cb# is [phone].  Please advise.

## 2017-02-12 NOTE — Addendum Note (Signed)
Addended by: Annitta Needs on: 02/12/2017 01:25 PM   Modules accepted: Orders

## 2017-02-12 NOTE — Telephone Encounter (Signed)
Received a fax refill request for a 90 day supply of Pantoprazole 40 mg take 1 tablet po bid before a meal.

## 2017-02-12 NOTE — Telephone Encounter (Signed)
Patient would like a Rx on Prednisone, stated that hands are swollen. Patient is having pain in his fingers. Patient is on Orencia and Imuran.Last prednisone taper was given 01/15/17.   Psoriatic arthritis Other psoriasis Rheumatoid arthritis involving multiple sites with positive rheumatoid factor   Please advise

## 2017-02-13 MED ORDER — PREDNISONE 5 MG PO TABS: ORAL_TABLET | ORAL | 0 refills | Status: DC

## 2017-02-13 NOTE — Addendum Note (Signed)
Addended by: Carole Binning on: 02/13/2017 03:27 PM   Modules accepted: Orders

## 2017-02-13 NOTE — Telephone Encounter (Signed)
Patient's wife advised prescription sent to the pharmacy.

## 2017-02-13 NOTE — Telephone Encounter (Signed)
Patient's wife [name] advised of results and recommendations. She will advise patient and help implement recommendations.

## 2017-02-26 ENCOUNTER — Telehealth: Payer: Self-pay

## 2017-02-26 NOTE — Telephone Encounter (Signed)
Ask him he would like a referral to [facility]

## 2017-02-26 NOTE — Telephone Encounter (Signed)
Spoke with patient's wife Curtis Flores regarding referring patient to [facility]. She states she will speak with her husband regarding the referral and contact the office with his response.

## 2017-02-26 NOTE — Telephone Encounter (Signed)
Called [employer] [employer] to check the status of patients renewal application for Orencia SubQ. Spoke with Loma Sousa who states that the patient will be required to submit a new application dated within 60 days of the renewal process along with a copy of pts updated federal tax return. If pt does not file taxes, they will accept social security benefit letter. They are also required to pay out 3% of yearly income on out of pocket Rx cost. The renewal process begins 03/04/2017. A renewal application was submitted on 11/27/2016 along with him and his wife's social security benefit letter. He will be required to submit a new application with the updated federal tax return because of the date. He receive his last refill on 02/11/17.   Called patient to update him.  Patient states that he does not feel like Maureen Chatters is working for him anymore. His next appointment is scheduled for 04/02/2017. He has enough medication to last until 03/07/17. He would like to see if he can be seen sooner to be evaluated and possibly started on a different medication. If not, can we provide samples to hold him over until then? Please advise. Thanks! We will hold off on renewing his application until he is seen.   Rylei Codispoti, [place], CPhT 10:34 AM

## 2017-03-07 ENCOUNTER — Telehealth: Payer: Self-pay | Admitting: Rheumatology

## 2017-03-07 MED ORDER — ABATACEPT 125 MG/ML ~~LOC~~ SOAJ: 125.0000 mg | SUBCUTANEOUS | 2 refills | Status: DC

## 2017-03-07 NOTE — Telephone Encounter (Signed)
Patient's wife states that the patient does not want to be referred outl. Patient will tal;k to Dr. Estanislado Pandy when in here for appointment on 04/02/17.   Last Visit: 11/26/16 Next Visit: 04/02/17 Labs: 01/15/17 Stable TB Gold: 09/19/16 Neg   Okay to refill per Dr. Estanislado Pandy

## 2017-03-07 NOTE — Telephone Encounter (Signed)
Patients wife also calling requesting a renewal of patients Prednisone. Patient uses [facility] in [place].

## 2017-03-07 NOTE — Telephone Encounter (Signed)
[name] (Patient's wife) called to request a return call.  Patient took his last shot of Cosentyx today.  He is scheduled for an appointment on 1/30.  CB# [phone]

## 2017-03-12 ENCOUNTER — Telehealth: Payer: Self-pay | Admitting: Rheumatology

## 2017-03-12 NOTE — Telephone Encounter (Signed)
Patient's wife to contact the company to make sure they have sent the medication. She will contact the office if the medication will not be to him by tomorrow morning as he is due for his injection on Friday. If they will not, she can come by the office to pick up a sample.

## 2017-03-12 NOTE — Telephone Encounter (Signed)
Attempted to contact the patient and left message for patient to call the office.  

## 2017-03-12 NOTE — Telephone Encounter (Signed)
Patient's wife called stating that he has run out of [place].  He was told that he would receive it by mail yesterday, but it hasn't arrived yet.  CB# [phone]

## 2017-03-19 NOTE — Progress Notes (Deleted)
Office Visit Note  Patient: Curtis Flores             Date of Birth: 12/04/1955           MRN: [id number]             PCP: Dione Housekeeper, MD Referring: Dione Housekeeper, MD Visit Date: 04/02/2017 Occupation: @GUAROCC @    Subjective:  No chief complaint on file.   History of Present Illness: Curtis Flores is a 62 y.o. male ***   Activities of Daily Living:  Patient reports morning stiffness for *** {minute/hour:19697}.   Patient {ACTIONS;DENIES/REPORTS:21021675::"Denies"} nocturnal pain.  Difficulty dressing/grooming: {ACTIONS;DENIES/REPORTS:21021675::"Denies"} Difficulty climbing stairs: {ACTIONS;DENIES/REPORTS:21021675::"Denies"} Difficulty getting out of chair: {ACTIONS;DENIES/REPORTS:21021675::"Denies"} Difficulty using hands for taps, buttons, cutlery, and/or writing: {ACTIONS;DENIES/REPORTS:21021675::"Denies"}   No Rheumatology ROS completed.   PMFS History:  Patient Active Problem List   Diagnosis Date Noted  . Ischemic cardiomyopathy 12/04/2016  . Leukocytosis 07/17/2016  . Pain in joint involving multiple sites 04/25/2016  . Psoriatic arthritis ([place]) 02/14/2016  . High risk medication use 02/14/2016  . NICM (nonischemic cardiomyopathy) ([place]) 08/29/2015  . Essential hypertension 08/29/2015  . Abnormal CT scan, stomach   . Hiatal hernia   . GERD (gastroesophageal reflux disease) 08/01/2015  . Weight loss, unintentional 08/01/2015  . Iron deficiency anemia 06/06/2015  . Heme positive stool 06/06/2015  . PAF (paroxysmal atrial fibrillation) ([place]) 05/30/2015  . CAD S/P percutaneous coronary angioplasty 04/07/2015  . Atrial flutter ([place]) 04/07/2015  . 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 Acute on chronic systolic congestive heart failure ([place]) 04/07/2015  . LBBB (left bundle branch block) 04/07/2015  . Wide-complex tachycardia ([place]) 04/07/2015  . Hyperlipidemia 03/07/2015  . Hypertension   . Psoriasis   . Rheumatoid arthritis ([place])   .  Pulmonary fibrosis ([place]) 03/21/2011    Past Medical History:  Diagnosis Date  . Abnormal CT scan, stomach   . Chronic systolic dysfunction of left ventricle   . Coronary artery disease    lateral STEMI 02/22/15  . GERD (gastroesophageal reflux disease)   . Hyperlipidemia   . Hypertension   . Ischemic cardiomyopathy   . LBBB (left bundle branch block)   . Leukocytosis    followed by hematology, reactive  . Lymphadenopathy   . Myocardial infarction ([place]) 02/2015  . Psoriasis 2003  . Psoriatic arthritis ([place])   . Pulmonary fibrosis ([place])   . Rheumatoid arthritis(714.0) 2012  . Typical atrial flutter ([facility])     Family History  Problem Relation Age of Onset  . Hypertension Mother   . Colon cancer Neg Hx    Past Surgical History:  Procedure Laterality Date  . CARDIAC CATHETERIZATION N/A 02/22/2015   Procedure: Left Heart Cath and Coronary Angiography;  Surgeon: Lorretta Harp, MD;  Location: [place] [facility];  Service: Cardiovascular;  Laterality: N/A;  . CARDIAC CATHETERIZATION N/A 02/22/2015   Procedure: Coronary Stent Intervention;  Surgeon: Lorretta Harp, MD;  Location: [place] [facility];  Service: Cardiovascular;  Laterality: N/A;  . CARDIOVERSION N/A 04/12/2015   Procedure: CARDIOVERSION;  Surgeon: Larey Dresser, MD;  Location: [facility] ENDOSCOPY;  Service: Cardiovascular;  Laterality: N/A;  . COLONOSCOPY  07/01/2003   UXL:KGMWNU colonic mucosa except for the proximal right colon in the area of ICV which was not seen completely due to inadequate bowel prep. followed with ACBE which was normal.   . COLONOSCOPY N/A 08/24/2015   Dr. Gala Romney: Normal colon. Next colonoscopy in 10 years.  [name] ELECTROPHYSIOLOGIC  STUDY N/A 05/30/2015   Atrial fibrillation ablation by Dr Rayann Heman  . ESOPHAGOGASTRODUODENOSCOPY N/A 08/24/2015   Dr. Gala Romney: Medium-sized hiatal hernia, erosive gastropathy. Cameron lesions. Esophageal mucosa distally suggestive of short segment Barrett's esophagus. Not confirmed  on biopsy. Gastric biopsy with minimal chronic inflammation  . GIVENS CAPSULE STUDY N/A 04/17/2016   Procedure: GIVENS CAPSULE STUDY;  Surgeon: Daneil Dolin, MD;  [facility];  Service: Endoscopy;  Laterality: N/A;  Pt to arrive at 8:00 am for 8:30 am appt  . TEE WITHOUT CARDIOVERSION N/A 04/12/2015   Procedure: TRANSESOPHAGEAL ECHOCARDIOGRAM (TEE);  Surgeon: Larey Dresser, MD;  Location: [facility] ENDOSCOPY;  Service: Cardiovascular;  Laterality: N/A;   Social History   Social History Narrative  . Not on file     Objective: Vital Signs: There were no vitals taken for this visit.   Physical Exam   Musculoskeletal Exam: ***  CDAI Exam: No CDAI exam completed.    Investigation: No additional findings.TB Gold: 09/19/2016 Negative  CBC Latest Ref Rng & Units 01/15/2017 11/26/2016 09/19/2016  WBC 4.0 - 10.5 K/uL 13.7(H) 18.4(H) 17.7(H)  Hemoglobin 13.0 - 17.0 g/dL 13.5 14.2 13.6  Hematocrit 39.0 - 52.0 % 42.3 43.8 42.6  Platelets 150 - 400 K/uL 280 208 291   CMP Latest Ref Rng & Units 01/15/2017 11/26/2016 09/19/2016  Glucose 65 - 99 mg/dL 112(H) 95 105(H)  BUN 6 - 20 mg/dL 12 9 14   Creatinine 0.61 - 1.24 mg/dL 0.63 0.54(L) 0.80  Sodium 135 - 145 mmol/L 137 143 142  Potassium 3.5 - 5.1 mmol/L 4.0 3.9 4.3  Chloride 101 - 111 mmol/L 99(L) 104 103  CO2 22 - 32 mmol/L 32 31 25  Calcium 8.9 - 10.3 mg/dL 9.2 9.2 9.2  Total Protein 6.5 - 8.1 g/dL 7.4 6.1 6.6  Total Bilirubin 0.3 - 1.2 mg/dL 0.5 0.5 0.4  Alkaline Phos 38 - 126 U/L 67 - 72  AST 15 - 41 U/L 22 19 23   ALT 17 - 63 U/L 16(L) 19 30    Imaging: No results found.  Speciality Comments: No specialty comments available.    Procedures:  No procedures performed Allergies: Patient has no known allergies.   Assessment / Plan:     Visit Diagnoses: No diagnosis found.    Orders: No orders of the defined types were placed in this encounter.  No orders of the defined types were placed in this  encounter.   Face-to-face time spent with patient was *** minutes. 50% of time was spent in counseling and coordination of care.  Follow-Up Instructions: No Follow-up on file.   Earnestine Mealing, CMA  Note - This record has been created using Editor, commissioning.  Chart creation errors have been sought, but may not always  have been located. Such creation errors do not reflect on  the standard of medical care.

## 2017-03-23 IMAGING — CR DG CHEST 1V PORT
1 series · 1 of 1 positions shown · non-contrast
Comparison: February 22, 2015.

CLINICAL DATA: Shortness of breath.

EXAM:
PORTABLE CHEST 1 VIEW

[AP]
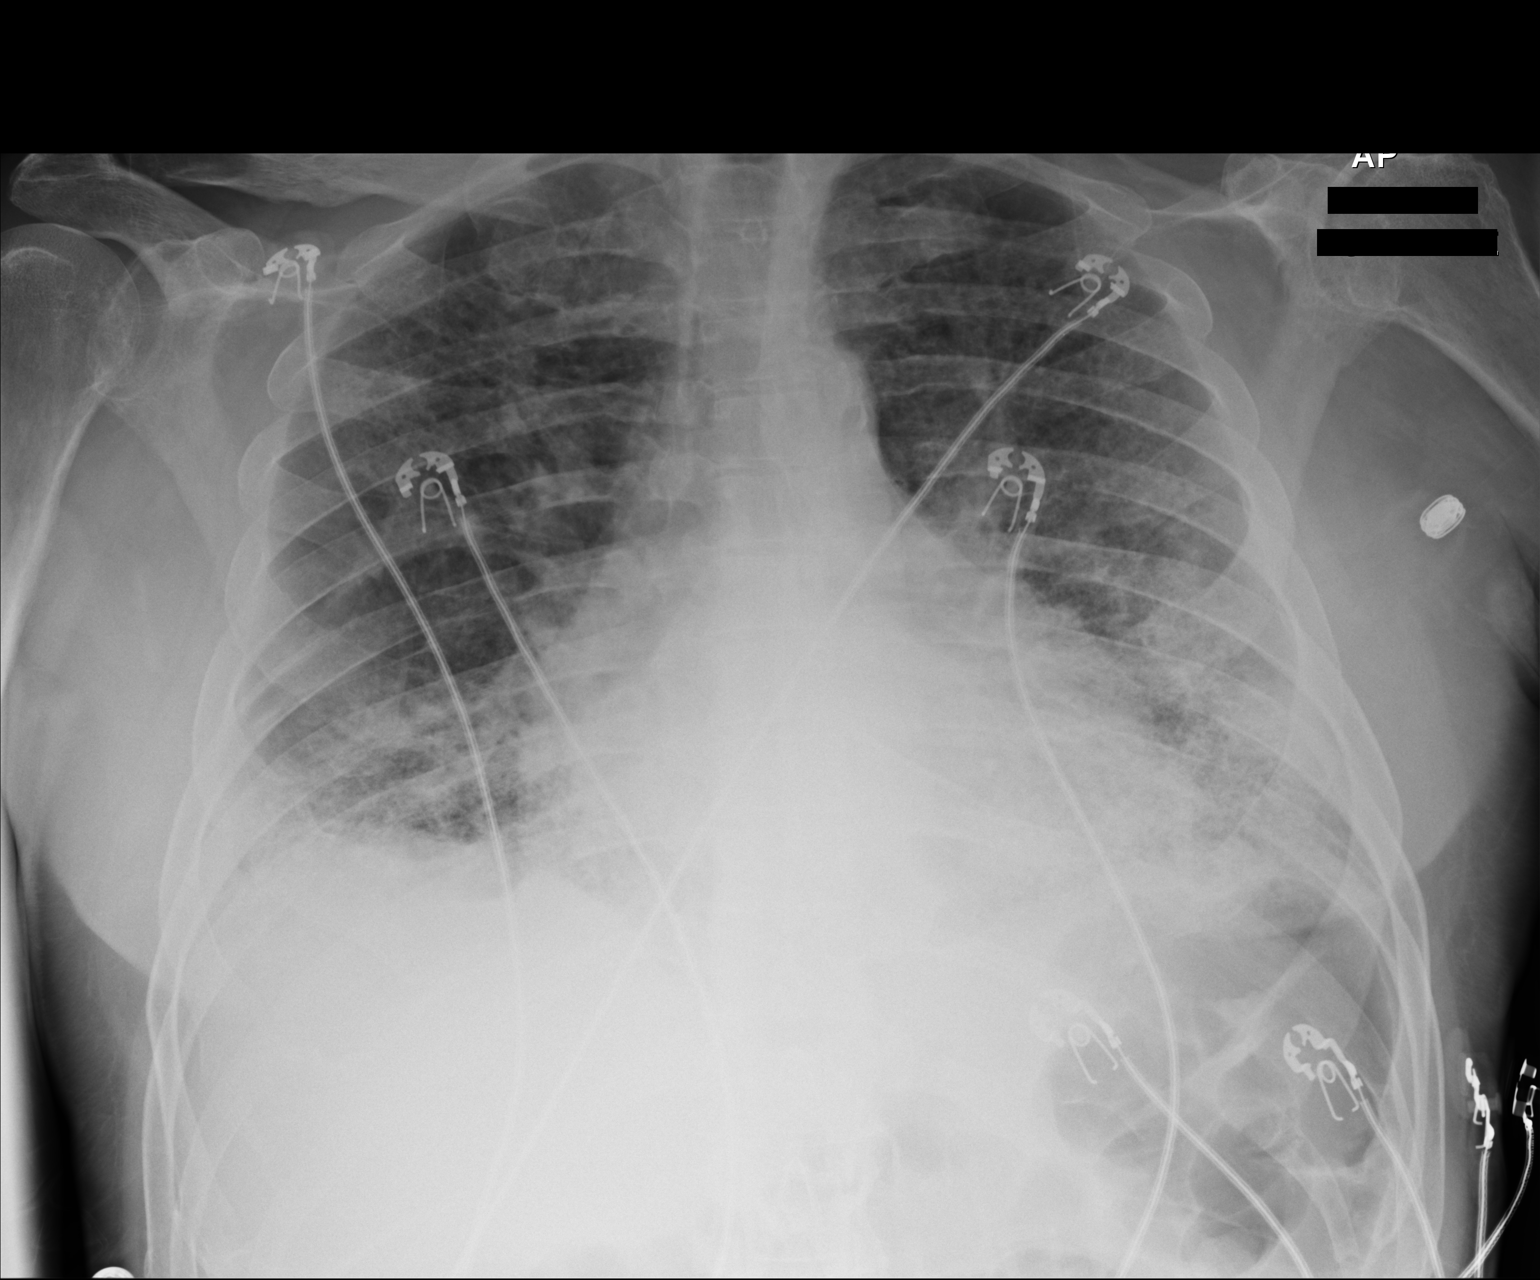

[1 of 1 positions shown; findings below may reference images not displayed]

FINDINGS: Stable cardiomegaly. No pneumothorax or significant pleural effusion
is noted. Stable bibasilar interstitial densities are noted most
consistent with fibrosis or atelectasis. Bony thorax is intact.
IMPRESSION: Stable bibasilar interstitial densities are noted most consistent
with fibrosis or possibly atelectasis. No significant changes noted
compared to prior exam.

## 2017-03-25 NOTE — Progress Notes (Signed)
Office Visit Note  Patient: Curtis Flores             Date of Birth: [birth year]           MRN: [id number]             PCP: Dione Housekeeper, MD Referring: Dione Housekeeper, MD Visit Date: 03/27/2017 Occupation: @GUAROCC @    Subjective:  Other (increased pain and swelling )   History of Present Illness: Curtis Flores is a 62 y.o. male with history of psoriatic arthritis and rheumatoid arthritis overlap. He states she's been having increased pain and discomfort since she's been off prednisone. He continues to have a lot of joint pain and joint swelling. He has not noticed any difference in his breathing so far. He's been taking Imuran and subcutaneous Orencia. He has not noticed any difference between the anti-TNF's and Orencia.  Activities of Daily Living:  Patient reports morning stiffness for all day hours.   Patient Reports nocturnal pain.  Difficulty dressing/grooming: Reports Difficulty climbing stairs: Reports Difficulty getting out of chair: Reports Difficulty using hands for taps, buttons, cutlery, and/or writing: Reports   Review of Systems  Constitutional: Positive for fatigue. Negative for night sweats and weakness ( ).  HENT: Positive for mouth dryness. Negative for mouth sores and nose dryness.   Eyes: Negative for redness and dryness.  Respiratory: Positive for shortness of breath. Negative for difficulty breathing.        On exertion  Cardiovascular: Negative for chest pain, palpitations, hypertension, irregular heartbeat and swelling in legs/feet.  Gastrointestinal: Negative for constipation and diarrhea.  Endocrine: Negative for increased urination.  Musculoskeletal: Positive for arthralgias, joint pain, joint swelling and morning stiffness. Negative for myalgias, muscle weakness, muscle tenderness and myalgias.  Skin: Positive for rash. Negative for color change, hair loss, nodules/bumps, skin tightness, ulcers and sensitivity to sunlight.  Allergic/Immunologic:  Negative for susceptible to infections.  Neurological: Negative for dizziness, fainting, memory loss and night sweats.  Hematological: Negative for swollen glands.  Psychiatric/Behavioral: Negative for depressed mood and sleep disturbance. The patient is not nervous/anxious.     PMFS History:  Patient Active Problem List   Diagnosis Date Noted  . Ischemic cardiomyopathy 12/04/2016  . Leukocytosis 07/17/2016  . Pain in joint involving multiple sites 04/25/2016  . Psoriatic arthritis ([place]) 02/14/2016  . High risk medication use 02/14/2016  . NICM (nonischemic cardiomyopathy) ([place]) 08/29/2015  . Essential hypertension 08/29/2015  . Abnormal CT scan, stomach   . Hiatal hernia   . GERD (gastroesophageal reflux disease) 08/01/2015  . Weight loss, unintentional 08/01/2015  . Iron deficiency anemia 06/06/2015  . Heme positive stool 06/06/2015  . PAF (paroxysmal atrial fibrillation) ([place]) 05/30/2015  . CAD S/P percutaneous coronary angioplasty 04/07/2015  . Atrial flutter ([place]) 04/07/2015  . 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 Acute on chronic systolic congestive heart failure ([place]) 04/07/2015  . LBBB (left bundle branch block) 04/07/2015  . Wide-complex tachycardia ([place]) 04/07/2015  . Hyperlipidemia 03/07/2015  . Hypertension   . Psoriasis   . Rheumatoid arthritis ([place])   . Pulmonary fibrosis ([place]) 03/21/2011    Past Medical History:  Diagnosis Date  . Abnormal CT scan, stomach   . Chronic systolic dysfunction of left ventricle   . Coronary artery disease    lateral STEMI 02/22/15  . GERD (gastroesophageal reflux disease)   . Hyperlipidemia   . Hypertension   . Ischemic cardiomyopathy   . LBBB (left bundle branch block)   .  Leukocytosis    followed by hematology, reactive  . Lymphadenopathy   . Myocardial infarction ([place]) 02/2015  . Psoriasis 2003  . Psoriatic arthritis ([address])   . Pulmonary fibrosis ([place])   . Rheumatoid arthritis(714.0) 2012  . Typical  atrial flutter (H[facility])     Family History  Problem Relation Age of Onset  . Hypertension Mother   . Colon cancer Neg Hx    Past Surgical History:  Procedure Laterality Date  . CARDIAC CATHETERIZATION N/A 02/22/2015   Procedure: Left Heart Cath and Coronary Angiography;  Surgeon: Lorretta Harp, MD;  Location: [place] [facility];  Service: Cardiovascular;  Laterality: N/A;  . CARDIAC CATHETERIZATION N/A 02/22/2015   Procedure: Coronary Stent Intervention;  Surgeon: Lorretta Harp, MD;  Location: [place] [facility];  Service: Cardiovascular;  Laterality: N/A;  . CARDIOVERSION N/A 04/12/2015   Procedure: CARDIOVERSION;  Surgeon: Larey Dresser, MD;  Location[facility] ENDOSCOPY;  Service: Cardiovascular;  Laterality: N/A;  . COLONOSCOPY  07/01/2003   PPJ:KDTOIZ colonic mucosa except for the proximal right colon in the area of ICV which was not seen completely due to inadequate bowel prep. followed with ACBE which was normal.   . COLONOSCOPY N/A 08/24/2015   Dr. Gala Romney: Normal colon. Next colonoscopy in 10 years.  [name] ELECTROPHYSIOLOGIC STUDY N/A 05/30/2015   Atrial fibrillation ablation by Dr Rayann Heman  . ESOPHAGOGASTRODUODENOSCOPY N/A 08/24/2015   Dr. Gala Romney: Medium-sized hiatal hernia, erosive gastropathy. Cameron lesions. Esophageal mucosa distally suggestive of short segment Barrett's esophagus. Not confirmed on biopsy. Gastric biopsy with minimal chronic inflammation  . GIVENS CAPSULE STUDY N/A 04/17/2016   Procedure: GIVENS CAPSULE STUDY;  Surgeon: Daneil Dolin, MD;  Location[facility];  Service: Endoscopy;  Laterality: N/A;  Pt to arrive at 8:00 am for 8:30 am appt  . TEE WITHOUT CARDIOVERSION N/A 04/12/2015   Procedure: TRANSESOPHAGEAL ECHOCARDIOGRAM (TEE);  Surgeon: Larey Dresser, MD;  Location: [facility] ENDOSCOPY;  Service: Cardiovascular;  Laterality: N/A;   Social History   Social History Narrative  . Not on file     Objective: Vital Signs: BP (!) 150/72 (BP Location: Left Arm, Patient  Position: Sitting, Cuff Size: Normal)   Pulse (!) 59   Resp 16   Ht 5\' 7"  (1.702 m)   Wt 186 lb (84.4 kg)   BMI 29.13 kg/m    Physical Exam  Constitutional: He is oriented to person, place, and time. He appears well-developed and well-nourished.  HENT:  Head: Normocephalic and atraumatic.  Eyes: Conjunctivae and EOM are normal. Pupils are equal, round, and reactive to light.  Neck: Normal range of motion. Neck supple.  Cardiovascular: Normal rate, regular rhythm and normal heart sounds.  Pulmonary/Chest: Effort normal and breath sounds normal.  Abdominal: Soft. Bowel sounds are normal.  Neurological: He is alert and oriented to person, place, and time.  Skin: Skin is warm and dry. Capillary refill takes less than 2 seconds. There is erythema.  Erythema around face and around the ears  Psychiatric: He has a normal mood and affect. His behavior is normal.  Nursing note and vitals reviewed.    Musculoskeletal Exam: C-spine and thoracic lumbar spine limited range of motion. He is painful range of motion of bilateral shoulders and elbow joints. His synovitis over bilateral wrist joints MCPs PIPs and DIPs as described below. His effusion in his bilateral knee joint swelling over ankles and MTPs and PIPs.  CDAI Exam: CDAI Homunculus Exam:   Tenderness:  RUE: glenohumeral and  wrist LUE: glenohumeral and wrist Right hand: 1st MCP, 2nd MCP, 3rd MCP, 4th MCP, 5th MCP, 2nd PIP, 3rd PIP, 4th PIP, 5th PIP, 2nd DIP and 3rd DIP Left hand: 1st MCP, 2nd MCP, 3rd MCP, 2nd PIP, 3rd PIP, 4th PIP, 5th PIP, 2nd DIP, 3rd DIP, 4th DIP and 5th DIP RLE: acetabulofemoral, tibiofemoral and tibiotalar LLE: acetabulofemoral, tibiofemoral and tibiotalar Right foot: 2nd MTP, 3rd MTP, 4th MTP, 2nd PIP, 3rd PIP and 4th PIP Left foot: 2nd MTP, 3rd MTP, 4th MTP, 2nd PIP and 3rd PIP  Swelling:  Right hand: 2nd PIP, 3rd PIP, 4th PIP, 5th PIP and 3rd DIP Left hand: 1st MCP, 2nd MCP, 3rd MCP, 2nd PIP, 3rd PIP, 4th  PIP and 5th PIP RLE: tibiofemoral and tibiotalar LLE: tibiofemoral and tibiotalar Right foot: 2nd MTP, 3rd MTP, 2nd PIP and 3rd PIP Left foot: 2nd MTP, 3rd MTP, 2nd PIP and 3rd PIP  Joint Counts:  CDAI Tender Joint count: 22 CDAI Swollen Joint count: 13  Global Assessments:  Patient Global Assessment: 8 Provider Global Assessment: 8  CDAI Calculated Score: 51    Investigation: No additional findings.TB Gold: 09/19/2016 Negative  CBC Latest Ref Rng & Units 01/15/2017 11/26/2016 09/19/2016  WBC 4.0 - 10.5 K/uL 13.7(H) 18.4(H) 17.7(H)  Hemoglobin 13.0 - 17.0 g/dL 13.5 14.2 13.6  Hematocrit 39.0 - 52.0 % 42.3 43.8 42.6  Platelets 150 - 400 K/uL 280 208 291   CMP Latest Ref Rng & Units 01/15/2017 11/26/2016 09/19/2016  Glucose 65 - 99 mg/dL 112(H) 95 105(H)  BUN 6 - 20 mg/dL 12 9 14   Creatinine 0.61 - 1.24 mg/dL 0.63 0.54(L) 0.80  Sodium 135 - 145 mmol/L 137 143 142  Potassium 3.5 - 5.1 mmol/L 4.0 3.9 4.3  Chloride 101 - 111 mmol/L 99(L) 104 103  CO2 22 - 32 mmol/L 32 31 25  Calcium 8.9 - 10.3 mg/dL 9.2 9.2 9.2  Total Protein 6.5 - 8.1 g/dL 7.4 6.1 6.6  Total Bilirubin 0.3 - 1.2 mg/dL 0.5 0.5 0.4  Alkaline Phos 38 - 126 U/L 67 - 72  AST 15 - 41 U/L 22 19 23   ALT 17 - 63 U/L 16(L) 19 30   03/25/2017 CBC WBC 15.4 hemoglobin 12.6 platelets 480, CMP normal, lipid panel LDL was normal, iron studies show low iron saturation. Imaging: No results found.  Speciality Comments: No specialty comments available.    Procedures:  No procedures performed Allergies: Patient has no known allergies.   Assessment / Plan:     Visit Diagnoses: Psoriatic arthritis ([place]): He has severe arthritis involving multiple joints as described above. He is not doing very well on current regimen. So far he has failed two anti-TNF's and Cosyntex. He has to stay on Imuran for underlying interstitial lung disease and the combination therapies are very limited. I had detailed discussion with the patient. I  discussed that he can be referred to a tertiary care center for second opinion but he declined. At this point we discussed keeping him on subcutaneous Orencia along with Imuran and adding low-dose prednisone. He will have to stay on long-term prednisone to control his symptoms. Side effects of long-term use of prednisone were also discussed at length. I will advised him to schedule an appointment with his cardiologist as his blood pressure is elevated and it can go higher with the prednisone usage. He will also be at risk of worsening of the congestive heart failure and atrial fibrillation with the prednisone usage.  Psoriasis: He is  having a flare with some rash on his face and scalp.  Rheumatoid arthritis involving multiple sites with positive rheumatoid factor (HCC) - +RF, +CCP: He continues to have synovitis.  High risk medication use - orencia sq(10/11/2016), imuran 100 mg po qd ( Inadequate response to Enbrel, Humira, Cosyntex, Otezla ). His labs are stable.  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 Elsworth Soho.  I will also schedule a DEXA scan as he'll be taking long-term prednisone. He is also taken multiple courses of prednisone in the recent past. Use of calcium and vitamin D was discussed.  History of bronchiectasis - Noted on his last CT chest.  History of coronary artery disease - left BBB , Plavix  History of congestive heart failure  History of gastroesophageal reflux (GERD)  History of hypertension: His blood pressure is elevated. I've advised him to follow up with his PCP.  History of hyperlipidemia  History of atrial fibrillation  Other iron deficiency anemia    Orders: Orders Placed This Encounter  Procedures  . DG BONE DENSITY (DXA)   Meds ordered this encounter  Medications  . azaTHIOprine (IMURAN) 50 MG tablet    Sig: Take 1 tablet (50 mg total) by mouth 2 (two) times daily.    Dispense:  180 tablet    Refill:  0  . Clobetasol Prop  Emollient Base (CLOBETASOL PROPIONATE E) 0.05 % emollient cream    Sig: Apply to affected area BID    Dispense:  30 g    Refill:  2  . predniSONE (DELTASONE) 5 MG tablet    Sig: Take 4 tablets x 1 week, then 3 tablets x 1 week then 10 mg daily.    Dispense:  120 tablet    Refill:  0    Face-to-face time spent with patient was 40 minutes. Greater than 50% of time was spent in counseling and coordination of care.  Follow-Up Instructions: Return in about 3 months (around 06/25/2017) for Psoriatic arthritis, Rheumatoid arthritis.   Bo Merino, MD  Note - This record has been created using Editor, commissioning.  Chart creation errors have been sought, but may not always  have been located. Such creation errors do not reflect on  the standard of medical care.

## 2017-03-27 ENCOUNTER — Telehealth: Payer: Self-pay

## 2017-03-27 ENCOUNTER — Ambulatory Visit (INDEPENDENT_AMBULATORY_CARE_PROVIDER_SITE_OTHER): Payer: Medicare Other | Admitting: Rheumatology

## 2017-03-27 ENCOUNTER — Encounter: Payer: Self-pay | Admitting: Rheumatology

## 2017-03-27 VITALS — BP 150/72 | HR 59 | Resp 16 | Ht 67.0 in | Wt 186.0 lb

## 2017-03-27 DIAGNOSIS — Z8719 Personal history of other diseases of the digestive system: Secondary | ICD-10-CM

## 2017-03-27 DIAGNOSIS — Z79899 Other long term (current) drug therapy: Secondary | ICD-10-CM

## 2017-03-27 DIAGNOSIS — Z8709 Personal history of other diseases of the respiratory system: Secondary | ICD-10-CM

## 2017-03-27 DIAGNOSIS — Z7952 Long term (current) use of systemic steroids: Secondary | ICD-10-CM | POA: Diagnosis not present

## 2017-03-27 DIAGNOSIS — L4050 Arthropathic psoriasis, unspecified: Secondary | ICD-10-CM

## 2017-03-27 DIAGNOSIS — D508 Other iron deficiency anemias: Secondary | ICD-10-CM | POA: Diagnosis not present

## 2017-03-27 DIAGNOSIS — J8410 Pulmonary fibrosis, unspecified: Secondary | ICD-10-CM

## 2017-03-27 DIAGNOSIS — L409 Psoriasis, unspecified: Secondary | ICD-10-CM

## 2017-03-27 DIAGNOSIS — M0579 Rheumatoid arthritis with rheumatoid factor of multiple sites without organ or systems involvement: Secondary | ICD-10-CM

## 2017-03-27 DIAGNOSIS — Z8639 Personal history of other endocrine, nutritional and metabolic disease: Secondary | ICD-10-CM | POA: Diagnosis not present

## 2017-03-27 DIAGNOSIS — Z8679 Personal history of other diseases of the circulatory system: Secondary | ICD-10-CM | POA: Diagnosis not present

## 2017-03-27 MED ORDER — AZATHIOPRINE 50 MG PO TABS: 50.0000 mg | ORAL_TABLET | Freq: Two times a day (BID) | ORAL | 0 refills | Status: DC

## 2017-03-27 MED ORDER — PREDNISONE 5 MG PO TABS: ORAL_TABLET | ORAL | 0 refills | Status: DC

## 2017-03-27 MED ORDER — CLOBETASOL PROP EMOLLIENT BASE 0.05 % EX CREA: TOPICAL_CREAM | CUTANEOUS | 2 refills | Status: DC

## 2017-03-27 NOTE — Telephone Encounter (Signed)
Spoke with pt in clinic at visit. We will be re-applying for patient assistance for Orencia through [place]. Patient signed application. His wife will fax the social security benefit forms for 2019 to the clinic on Monday 03/31/17 Once all docs are received, I will fax application to foundation. Patient voices understanding and denies any questions at this time.   Carianna Lague, [place], CPhT 3:29 PM

## 2017-03-27 NOTE — Patient Instructions (Signed)
Standing Labs We placed an order today for your standing lab work.    Please come back and get your standing labs in April and every 3 months  We have open lab Monday through Friday from 8:30-11:30 AM and 1:30-4 PM at the office of Dr. Halston Kintz.   The office is located at [address], [address], [place], [place] [address] No appointment is necessary.   Labs are drawn by [facility].  You may receive a bill from [facility] for your lab work. If you have any questions regarding directions or hours of operation,  please call [phone].    

## 2017-03-28 ENCOUNTER — Telehealth: Payer: Self-pay | Admitting: Internal Medicine

## 2017-03-28 NOTE — Telephone Encounter (Signed)
Spoke with patient's wife about recall. She saw a commercial about the losartan recall and reports that her husband stopped the medication a few days ago due to this. Advised that pharmacy should have contacted him if his lot had been affected and that it was not the active ingredient losartan that was recalled, just the lots with the contaminant. Advised that he resume the losartan and call with any other questions or concerns. She stated understanding and appreciation for information.

## 2017-03-28 NOTE — Telephone Encounter (Signed)
Received pts fax regarding his social security benefit letter. Application has been faxed to foundation. Will update once we receive a response.  Anajah Sterbenz, [place], CPhT 1:51 PM

## 2017-03-28 NOTE — Telephone Encounter (Signed)
Pt c/o medication issue:  1. Name of Medication: Losartan Potassium   2. How are you currently taking this medication (dosage and times per day)? 100 mg once daily   3. Are you having a reaction (difficulty breathing--STAT)? no  4. What is your medication issue? Recall

## 2017-04-02 ENCOUNTER — Ambulatory Visit: Payer: Medicare Other | Admitting: Rheumatology

## 2017-04-09 ENCOUNTER — Telehealth: Payer: Self-pay | Admitting: Rheumatology

## 2017-04-09 NOTE — Telephone Encounter (Signed)
Patient's wife [name] left a voicemail stating "the truck did not come with the Orencia this week."  She requested a return call.

## 2017-04-09 NOTE — Telephone Encounter (Signed)
Patient is due for Orencia injection on Friday. They will be coming by on Friday morning to pick up a sample. Sample is okay to give, per [facility].

## 2017-04-18 ENCOUNTER — Telehealth: Payer: Self-pay | Admitting: Rheumatology

## 2017-04-18 NOTE — Telephone Encounter (Signed)
Yes we may provide him with a sample

## 2017-04-18 NOTE — Telephone Encounter (Signed)
Spoke with pts wife. She states that she called the [employer] patient assistance for Orenica to check the status of the application. They are missing a copy of the RX and will need one in order to complete the application.   Called [employer] to clarify. Spoke with G[place]. She verified providers portion. Rx will be mailed to pts home for a month supply at a time. He has received syringes in the past and will receive syringes going forward unless changed by provider.  Rx is being processed urgently. Application will be processed not later that Monday. Pt will then be able to schedule delivery of Orencia.  Called pt back. Spoke with wife. She voices understanding. If [employer] will not be able to ship out meds in time for next fill, can we will provide a sample?   Thanks!  Ziad Maye, [place], CPhT 4:24 PM

## 2017-04-18 NOTE — Telephone Encounter (Signed)
Patients wife calling requesting a call from [place]. Wife states she left a message yesterday, but has not received a call back.

## 2017-04-21 NOTE — Telephone Encounter (Signed)
Called [employer] to check the status of pts application. Spoke with Mateo Flow who states that the date on the provider portion is dated 1/ 24/2018. Date must be changed to 2019 in order to continue processing.   Date on application was corrected and faxed back. Will update once we receive a response.   Called to pt to update. Spoke with his wife. Informed her about offering samples and the application update. She voices understanding and denies any questions at this time.   Manila Rommel, [place], CPhT  1:45 PM

## 2017-04-22 ENCOUNTER — Telehealth: Payer: Self-pay | Admitting: Rheumatology

## 2017-04-22 NOTE — Telephone Encounter (Signed)
Patients wife calling to let you know Curtis Flores is going to be shipped to him tomorrow.

## 2017-04-22 NOTE — Telephone Encounter (Signed)
Patient's wife advised that since patient is due for injection on Friday he should wait to given injection until Friday. She verbalized understanding.

## 2017-04-22 NOTE — Telephone Encounter (Signed)
Patient's wife called stating that the Curtis Flores is arriving tomorrow and wants to know if [name] can take his shot tomorrow or does he need to wait until Friday.

## 2017-04-22 NOTE — Telephone Encounter (Signed)
Received a call from [employer]. [name] left a voicemail stating that pt has been approved to receive Orencia SQ free of charge from 04/21/2017 through 03/03/2018. They needed clarification on where the medication should be delivered.   Phone: [phone] ID: [id number]  Called to clarify. Spoke with a representative who state that it will take 7 to 10 business days to coordinate delivery of the medicaiton. Pt may call the pharmacy to possibly expedite the delivery [phone]). They will send an approval letter to the clinic. Will send document to scan center once received.   Called pt to update. Spoke with Curtis Flores. Gave her the number to the pharmacy. She will call them to schedule delivery and will contact us if he will still need a sample.  Curtis Flores, [place], CPhT 8:31 AM

## 2017-04-23 ENCOUNTER — Other Ambulatory Visit: Payer: Self-pay | Admitting: Rheumatology

## 2017-04-23 NOTE — Telephone Encounter (Signed)
Last Visit: 03/27/17 Next Visit: 06/24/17  Okay to refill per Dr. Estanislado Pandy

## 2017-05-09 IMAGING — NM NM MISC PROCEDURE
6 series · 36 of 36 positions shown · non-contrast
Comparison: none

[Series 1: wbr_r-proj_st wbr rest · 6.40mm/px · 6 of 64 frames shown]
[frame 6/64]
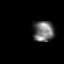
[frame 16/64]
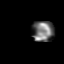
[frame 27/64]
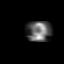
[frame 38/64]
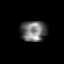
[frame 48/64]
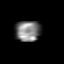
[frame 59/64]
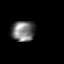

[Series 1: wbr rest · 6.40mm/px · 6 of 64 frames shown]
[frame 6/64]
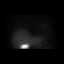
[frame 16/64]
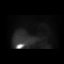
[frame 27/64]
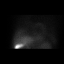
[frame 38/64]
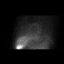
[frame 48/64]
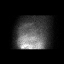
[frame 59/64]
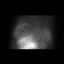

[Series 2: wbr stress-gsp · 6.40mm/px · 6 of 512 frames shown]
[frame 43/512]
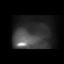
[frame 128/512]
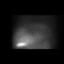
[frame 214/512]
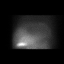
[frame 299/512]
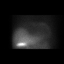
[frame 384/512]
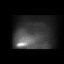
[frame 470/512]
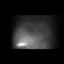

[Series 2: wbr_s-proj_st wbr stress-gsp · 6.40mm/px · 6 of 512 frames shown]
[frame 43/512]
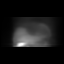
[frame 128/512]
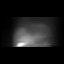
[frame 214/512]
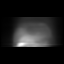
[frame 299/512]
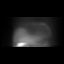
[frame 384/512]
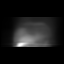
[frame 470/512]
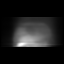

[Series 3: wbr_s-proj_st wbr stress-sum-em · 6.40mm/px · 6 of 64 frames shown]
[frame 6/64]
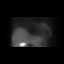
[frame 16/64]
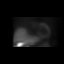
[frame 27/64]
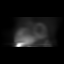
[frame 38/64]
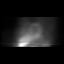
[frame 48/64]
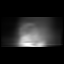
[frame 59/64]
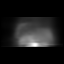

[Series 3: wbr stress-sum-em · 6.40mm/px · 6 of 64 frames shown]
[frame 6/64]
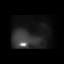
[frame 16/64]
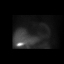
[frame 27/64]
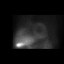
[frame 38/64]
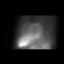
[frame 48/64]
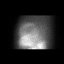
[frame 59/64]
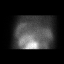

[36 of 36 positions shown; findings below may reference images not displayed]

Canned report from images found in remote index.

Refer to host system for actual result text.

## 2017-05-14 ENCOUNTER — Telehealth: Payer: Self-pay | Admitting: Rheumatology

## 2017-05-14 NOTE — Telephone Encounter (Signed)
Curtis Flores has been advised patient will be on long term low dose prednisone. She verbalized understanding.

## 2017-05-14 NOTE — Telephone Encounter (Signed)
Patient's wife called to find out if Reice should continue taking the Prednisone.

## 2017-05-15 ENCOUNTER — Other Ambulatory Visit: Payer: Self-pay | Admitting: Rheumatology

## 2017-05-15 NOTE — Telephone Encounter (Signed)
Last Visit: 03/27/17 Next Visit: 06/24/17  Okay to refill per Dr. Estanislado Pandy

## 2017-06-11 NOTE — Progress Notes (Deleted)
Office Visit Note  Patient: Curtis Flores             Date of Birth: 1955/03/19           MRN: [id number]             PCP: Dione Housekeeper, MD Referring: Dione Housekeeper, MD Visit Date: 06/24/2017 Occupation: @GUAROCC @    Subjective:  No chief complaint on file.   History of Present Illness: Curtis Flores is a 62 y.o. male ***   Activities of Daily Living:  Patient reports morning stiffness for *** {minute/hour:19697}.   Patient {ACTIONS;DENIES/REPORTS:21021675::"Denies"} nocturnal pain.  Difficulty dressing/grooming: {ACTIONS;DENIES/REPORTS:21021675::"Denies"} Difficulty climbing stairs: {ACTIONS;DENIES/REPORTS:21021675::"Denies"} Difficulty getting out of chair: {ACTIONS;DENIES/REPORTS:21021675::"Denies"} Difficulty using hands for taps, buttons, cutlery, and/or writing: {ACTIONS;DENIES/REPORTS:21021675::"Denies"}   No Rheumatology ROS completed.   PMFS History:  Patient Active Problem List   Diagnosis Date Noted  . Ischemic cardiomyopathy 12/04/2016  . Leukocytosis 07/17/2016  . Pain in joint involving multiple sites 04/25/2016  . Psoriatic arthritis ([place]) 02/14/2016  . High risk medication use 02/14/2016  . NICM (nonischemic cardiomyopathy) ([place]) 08/29/2015  . Essential hypertension 08/29/2015  . Abnormal CT scan, stomach   . Hiatal hernia   . GERD (gastroesophageal reflux disease) 08/01/2015  . Weight loss, unintentional 08/01/2015  . Iron deficiency anemia 06/06/2015  . Heme positive stool 06/06/2015  . PAF (paroxysmal atrial fibrillation) ([employer]) 05/30/2015  . CAD S/P percutaneous coronary angioplasty 04/07/2015  . Atrial flutter ([place]) 04/07/2015  . 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 Acute on chronic systolic congestive heart failure ([place]) 04/07/2015  . LBBB (left bundle branch block) 04/07/2015  . Wide-complex tachycardia ([place]) 04/07/2015  . Hyperlipidemia 03/07/2015  . Hypertension   . Psoriasis   . Rheumatoid arthritis ([place])   .  Pulmonary fibrosis ([place]) 03/21/2011    Past Medical History:  Diagnosis Date  . Abnormal CT scan, stomach   . Chronic systolic dysfunction of left ventricle   . Coronary artery disease    lateral STEMI 02/22/15  . GERD (gastroesophageal reflux disease)   . Hyperlipidemia   . Hypertension   . Ischemic cardiomyopathy   . LBBB (left bundle branch block)   . Leukocytosis    followed by hematology, reactive  . Lymphadenopathy   . Myocardial infarction ([place]) 02/2015  . Psoriasis 2003  . Psoriatic arthritis ([place])   . Pulmonary fibrosis ([place])   . Rheumatoid arthritis(714.0) 2012  . Typical atrial flutter (HCC)     Family History  Problem Relation Age of Onset  . Hypertension Mother   . Colon cancer Neg Hx    Past Surgical History:  Procedure Laterality Date  . CARDIAC CATHETERIZATION N/A 02/22/2015   Procedure: Left Heart Cath and Coronary Angiography;  Surgeon: Lorretta Harp, MD;  Location: [facility];  Service: Cardiovascular;  Laterality: N/A;  . CARDIAC CATHETERIZATION N/A 02/22/2015   Procedure: Coronary Stent Intervention;  Surgeon: Lorretta Harp, MD;  Location: [facility];  Service: Cardiovascular;  Laterality: N/A;  . CARDIOVERSION N/A 04/12/2015   Procedure: CARDIOVERSION;  Surgeon: Larey Dresser, MD;  Location: [facility] ENDOSCOPY;  Service: Cardiovascular;  Laterality: N/A;  . COLONOSCOPY  07/01/2003   WJX:[id number] colonic mucosa except for the proximal right colon in the area of ICV which was not seen completely due to inadequate bowel prep. followed with ACBE which was normal.   . COLONOSCOPY N/A 08/24/2015   Dr. Gala Romney: Normal colon. Next colonoscopy in 10 years.  [name] ELECTROPHYSIOLOGIC  STUDY N/A 05/30/2015   Atrial fibrillation ablation by Dr Rayann Heman  . ESOPHAGOGASTRODUODENOSCOPY N/A 08/24/2015   Dr. Gala Romney: Medium-sized hiatal hernia, erosive gastropathy. Cameron lesions. Esophageal mucosa distally suggestive of short segment Barrett's esophagus. Not confirmed  on biopsy. Gastric biopsy with minimal chronic inflammation  . GIVENS CAPSULE STUDY N/A 04/17/2016   Procedure: GIVENS CAPSULE STUDY;  Surgeon: Daneil Dolin, MD;  [facility];  Service: Endoscopy;  Laterality: N/A;  Pt to arrive at 8:00 am for 8:30 am appt  . TEE WITHOUT CARDIOVERSION N/A 04/12/2015   Procedure: TRANSESOPHAGEAL ECHOCARDIOGRAM (TEE);  Surgeon: Larey Dresser, MD;  Location: [facility] ENDOSCOPY;  Service: Cardiovascular;  Laterality: N/A;   Social History   Social History Narrative  . Not on file     Objective: Vital Signs: There were no vitals taken for this visit.   Physical Exam   Musculoskeletal Exam: ***  CDAI Exam: No CDAI exam completed.    Investigation: No additional findings. Labs: 05/09/2017  TB Gold: 09/19/2016 Negative  Imaging: No results found.  Speciality Comments: No specialty comments available.    Procedures:  No procedures performed Allergies: Patient has no known allergies.   Assessment / Plan:     Visit Diagnoses: No diagnosis found.    Orders: No orders of the defined types were placed in this encounter.  No orders of the defined types were placed in this encounter.   Face-to-face time spent with patient was *** minutes. 50% of time was spent in counseling and coordination of care.  Follow-Up Instructions: No follow-ups on file.   Earnestine Mealing, CMA  Note - This record has been created using Editor, commissioning.  Chart creation errors have been sought, but may not always  have been located. Such creation errors do not reflect on  the standard of medical care.

## 2017-06-23 NOTE — Progress Notes (Signed)
Office Visit Note  Patient: Curtis Flores             Date of Birth: May 23, 1955           MRN: [id number]             PCP: Dione Housekeeper, MD Referring: Dione Housekeeper, MD Visit Date: 07/01/2017 Occupation: @GUAROCC @    Subjective:  Medication management   History of Present Illness: Curtis Flores is a 62 y.o. male with history of psoriatic arthritis and rheumatoid arthritis overlap.  He is doing better since he has resumed prednisone 10 mg p.o. daily.  He is currently on combination of Orencia, Imuran and prednisone.  He has to schedule appointment with Dr. Elsworth Soho for follow-up visit.  He is unable to taper prednisone due to his lung disease and arthritis.  Activities of Daily Living:  Patient reports morning stiffness for 2 minutes.   Patient Denies nocturnal pain.  Difficulty dressing/grooming: Denies Difficulty climbing stairs: Denies Difficulty getting out of chair: Denies Difficulty using hands for taps, buttons, cutlery, and/or writing: Denies   Review of Systems  Constitutional: Positive for fatigue. Negative for night sweats.  HENT: Negative for mouth sores, mouth dryness and nose dryness.   Eyes: Negative for redness and dryness.  Respiratory: Positive for shortness of breath. Negative for difficulty breathing.        Occasional  Cardiovascular: Negative for chest pain, palpitations, hypertension, irregular heartbeat and swelling in legs/feet.  Gastrointestinal: Negative for constipation and diarrhea.  Endocrine: Negative for increased urination.  Musculoskeletal: Positive for morning stiffness. Negative for arthralgias, joint pain, joint swelling, myalgias, muscle weakness, muscle tenderness and myalgias.  Skin: Negative for color change, rash, hair loss, nodules/bumps, skin tightness, ulcers and sensitivity to sunlight.  Allergic/Immunologic: Negative for susceptible to infections.  Neurological: Negative for dizziness, fainting, memory loss, night sweats and  weakness ( ).  Hematological: Negative for swollen glands.  Psychiatric/Behavioral: Negative for depressed mood and sleep disturbance. The patient is not nervous/anxious.     PMFS History:  Patient Active Problem List   Diagnosis Date Noted  . Ischemic cardiomyopathy 12/04/2016  . Leukocytosis 07/17/2016  . Pain in joint involving multiple sites 04/25/2016  . Psoriatic arthritis ([place]) 02/14/2016  . High risk medication use 02/14/2016  . NICM (nonischemic cardiomyopathy) ([place]) 08/29/2015  . Essential hypertension 08/29/2015  . Abnormal CT scan, stomach   . Hiatal hernia   . GERD (gastroesophageal reflux disease) 08/01/2015  . Weight loss, unintentional 08/01/2015  . Iron deficiency anemia 06/06/2015  . Heme positive stool 06/06/2015  . PAF (paroxysmal atrial fibrillation) ([place]) 05/30/2015  . CAD S/P percutaneous coronary angioplasty 04/07/2015  . Atrial flutter ([place]) 04/07/2015  . 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 Acute on chronic systolic congestive heart failure ([place]) 04/07/2015  . LBBB (left bundle branch block) 04/07/2015  . Wide-complex tachycardia ([place]) 04/07/2015  . Hyperlipidemia 03/07/2015  . Hypertension   . Psoriasis   . Rheumatoid arthritis ([place])   . Pulmonary fibrosis ([place]) 03/21/2011    Past Medical History:  Diagnosis Date  . Abnormal CT scan, stomach   . Chronic systolic dysfunction of left ventricle   . Coronary artery disease    lateral STEMI 02/22/15  . GERD (gastroesophageal reflux disease)   . Hyperlipidemia   . Hypertension   . Ischemic cardiomyopathy   . LBBB (left bundle branch block)   . Leukocytosis    followed by hematology, reactive  . Lymphadenopathy   . Myocardial  infarction ([place]) 02/2015  . Psoriasis 2003  . Psoriatic arthritis ([place])   . Pulmonary fibrosis ([place])   . Rheumatoid arthritis(714.0) 2012  . Typical atrial flutter (HCC)     Family History  Problem Relation Age of Onset  . Hypertension Mother   . Colon  cancer Neg Hx    Past Surgical History:  Procedure Laterality Date  . CARDIAC CATHETERIZATION N/A 02/22/2015   Procedure: Left Heart Cath and Coronary Angiography;  Surgeon: Lorretta Harp, MD;  Location: [facility];  Service: Cardiovascular;  Laterality: N/A;  . CARDIAC CATHETERIZATION N/A 02/22/2015   Procedure: Coronary Stent Intervention;  Surgeon: Lorretta Harp, MD;  Location: [facility];  Service: Cardiovascular;  Laterality: N/A;  . CARDIOVERSION N/A 04/12/2015   Procedure: CARDIOVERSION;  Surgeon: Larey Dresser, MD;  Location: [facility] ENDOSCOPY;  Service: Cardiovascular;  Laterality: N/A;  . COLONOSCOPY  07/01/2003   GQQ:PYPPJK colonic mucosa except for the proximal right colon in the area of ICV which was not seen completely due to inadequate bowel prep. followed with ACBE which was normal.   . COLONOSCOPY N/A 08/24/2015   Dr. Gala Romney: Normal colon. Next colonoscopy in 10 years.  [name] ELECTROPHYSIOLOGIC STUDY N/A 05/30/2015   Atrial fibrillation ablation by Dr Rayann Heman  . ESOPHAGOGASTRODUODENOSCOPY N/A 08/24/2015   Dr. Gala Romney: Medium-sized hiatal hernia, erosive gastropathy. Cameron lesions. Esophageal mucosa distally suggestive of short segment Barrett's esophagus. Not confirmed on biopsy. Gastric biopsy with minimal chronic inflammation  . GIVENS CAPSULE STUDY N/A 04/17/2016   Procedure: GIVENS CAPSULE STUDY;  Surgeon: Daneil Dolin, MD;  [facility];  Service: Endoscopy;  Laterality: N/A;  Pt to arrive at 8:00 am for 8:30 am appt  . TEE WITHOUT CARDIOVERSION N/A 04/12/2015   Procedure: TRANSESOPHAGEAL ECHOCARDIOGRAM (TEE);  Surgeon: Larey Dresser, MD;  Location: [facility] ENDOSCOPY;  Service: Cardiovascular;  Laterality: N/A;   Social History   Social History Narrative  . Not on file     Objective: Vital Signs: BP 130/62 (BP Location: Left Arm, Patient Position: Sitting, Cuff Size: Large)   Pulse (!) 48   Resp 14   Ht 5\' 7"  (1.702 m)   Wt 191 lb (86.6 kg)    BMI 29.91 kg/m    Physical Exam  Constitutional: He is oriented to person, place, and time. He appears well-developed and well-nourished.  HENT:  Head: Normocephalic and atraumatic.  Eyes: Pupils are equal, round, and reactive to light. Conjunctivae and EOM are normal.  Neck: Normal range of motion. Neck supple.  Cardiovascular: Normal rate, regular rhythm and normal heart sounds.  Pulmonary/Chest: Effort normal. He has rales.  In bilateral lung bases  Abdominal: Soft. Bowel sounds are normal.  Neurological: He is alert and oriented to person, place, and time.  Skin: Skin is warm and dry. Capillary refill takes less than 2 seconds.  Psychiatric: He has a normal mood and affect. His behavior is normal.  Nursing note and vitals reviewed.    Musculoskeletal Exam: Cervical, thoracic, lumbar spine good range of motion.  Shoulder joints and elbow joints are good range of motion.  He has synovitis over bilateral wrist joints several of his MCPs and PIPs as described below.  He has warmth in his right knee joint.  CDAI Exam: CDAI Homunculus Exam:   Tenderness:  RUE: wrist LUE: wrist Right hand: 2nd MCP, 3rd MCP, 4th MCP, 5th MCP, 2nd PIP, 3rd PIP, 4th PIP and 5th PIP Left hand: 2nd MCP, 5th MCP,  2nd PIP and 4th PIP  Swelling:  RUE: wrist LUE: wrist Right hand: 2nd MCP, 3rd MCP, 4th MCP, 5th MCP, 2nd PIP and 4th PIP Left hand: 2nd MCP and 5th MCP RLE: tibiofemoral  Joint Counts:  CDAI Tender Joint count: 14 CDAI Swollen Joint count: 11  Global Assessments:  Patient Global Assessment: 3 Provider Global Assessment: 6  CDAI Calculated Score: 34    Investigation: No additional findings. Labs: 05/06/2017 WBC elevated on prednisone  Imaging: No results found.  Speciality Comments: No specialty comments available.    Procedures:  No procedures performed Allergies: Patient has no known allergies.   Assessment / Plan:     Visit Diagnoses: Psoriatic arthritis (HCC)-he  continues to have some warmth and swelling in multiple joints with synovitis.  Although his symptoms have improved since he has been taking prednisone 10 mg a day.  He is unable to taper his prednisone.  He is a still on Orencia subcu and Imuran.  Despite of multiple medications he still continues to have synovitis in multiple joints as described above.  He will not be able to taper his prednisone.  Psoriasis-he has no active psoriasis lesions currently.  Rheumatoid arthritis involving multiple sites with positive rheumatoid factor (HCC) - +RF, +CCP.  He continues to have synovitis in his bilateral wrist joints and MCP joints. High risk medication use - orencia sq(10/11/2016), imuran 100 mg po qd, prednisone 10 mg po qd ( Inadequate response to Enbrel, Humira, Cosyntex, Otezla ).  His labs have been stable.  We will continue to monitor his labs every 3 months.  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 Elsworth Soho.  History of congestive heart failure-followed by cardiologist.  History of bronchiectasis - Noted on his last CT chest.  He has been advised to schedule an appointment with Dr. Elsworth Soho.  Osteopenia of multiple sites: T score was -1.3 in October 2018.  I discussed possible use of Fosamax as he will be on long-term prednisone.  Patient declined therapy.  At this point he will continue on calcium and vitamin D.  Need for exercise was also discussed.  History of coronary artery disease - left BBB , Plavix  History of atrial fibrillation  History of hypertension-blood pressure is well controlled.  History of gastroesophageal reflux (GERD)  History of hyperlipidemia  Other iron deficiency anemia    Orders: Orders Placed This Encounter  Procedures  . CBC with Differential/Platelet  . COMPLETE METABOLIC PANEL WITH GFR  . QuantiFERON-TB Gold Plus   No orders of the defined types were placed in this encounter.   Face-to-face time spent with patient was 30  minutes.Greater than 50% of time was spent in counseling and coordination of care.  Follow-Up Instructions: Return in about 5 months (around 12/01/2017) for Psoriatic arthritis, Rheumatoid arthritis, ILD.   Bo Merino, MD  Note - This record has been created using Editor, commissioning.  Chart creation errors have been sought, but may not always  have been located. Such creation errors do not reflect on  the standard of medical care.

## 2017-06-24 ENCOUNTER — Ambulatory Visit: Payer: Medicare Other | Admitting: Rheumatology

## 2017-06-26 ENCOUNTER — Telehealth: Payer: Self-pay | Admitting: Rheumatology

## 2017-06-26 NOTE — Telephone Encounter (Signed)
Left message to advise Curtis Flores that Curtis Flores does not need lab prior to his appointment.

## 2017-06-26 NOTE — Telephone Encounter (Signed)
Patient's wife Curtis Flores called stating that she was checking to see if Curtis Flores is due for labs.  Patient has an appointment with Dr. Estanislado Pandy on 07/01/17 and wants to make sure he doesn't need to get his labwork before his appointment.

## 2017-07-01 ENCOUNTER — Ambulatory Visit (INDEPENDENT_AMBULATORY_CARE_PROVIDER_SITE_OTHER): Payer: Medicare Other | Admitting: Rheumatology

## 2017-07-01 ENCOUNTER — Encounter: Payer: Self-pay | Admitting: Rheumatology

## 2017-07-01 VITALS — BP 130/62 | HR 48 | Resp 14 | Ht 67.0 in | Wt 191.0 lb

## 2017-07-01 DIAGNOSIS — L4050 Arthropathic psoriasis, unspecified: Secondary | ICD-10-CM

## 2017-07-01 DIAGNOSIS — Z8679 Personal history of other diseases of the circulatory system: Secondary | ICD-10-CM | POA: Diagnosis not present

## 2017-07-01 DIAGNOSIS — M8589 Other specified disorders of bone density and structure, multiple sites: Secondary | ICD-10-CM

## 2017-07-01 DIAGNOSIS — Z8639 Personal history of other endocrine, nutritional and metabolic disease: Secondary | ICD-10-CM | POA: Diagnosis not present

## 2017-07-01 DIAGNOSIS — Z79899 Other long term (current) drug therapy: Secondary | ICD-10-CM

## 2017-07-01 DIAGNOSIS — Z8709 Personal history of other diseases of the respiratory system: Secondary | ICD-10-CM

## 2017-07-01 DIAGNOSIS — L409 Psoriasis, unspecified: Secondary | ICD-10-CM

## 2017-07-01 DIAGNOSIS — M0579 Rheumatoid arthritis with rheumatoid factor of multiple sites without organ or systems involvement: Secondary | ICD-10-CM | POA: Diagnosis not present

## 2017-07-01 DIAGNOSIS — Z8719 Personal history of other diseases of the digestive system: Secondary | ICD-10-CM | POA: Diagnosis not present

## 2017-07-01 DIAGNOSIS — D508 Other iron deficiency anemias: Secondary | ICD-10-CM

## 2017-07-01 DIAGNOSIS — J8410 Pulmonary fibrosis, unspecified: Secondary | ICD-10-CM

## 2017-07-01 LAB — CBC WITH DIFFERENTIAL/PLATELET
BASOS PCT: 0.7 %
Basophils Absolute: 106 cells/uL (ref 0–200)
EOS ABS: 30 {cells}/uL (ref 15–500)
Eosinophils Relative: 0.2 %
HCT: 43.5 % (ref 38.5–50.0)
HEMOGLOBIN: 14.2 g/dL (ref 13.2–17.1)
Lymphs Abs: 2703 cells/uL (ref 850–3900)
MCH: 26.6 pg — AB (ref 27.0–33.0)
MCHC: 32.6 g/dL (ref 32.0–36.0)
MCV: 81.6 fL (ref 80.0–100.0)
MONOS PCT: 4.1 %
MPV: 12.6 fL — AB (ref 7.5–12.5)
Neutro Abs: 11642 cells/uL — ABNORMAL HIGH (ref 1500–7800)
Neutrophils Relative %: 77.1 %
Platelets: 251 10*3/uL (ref 140–400)
RBC: 5.33 10*6/uL (ref 4.20–5.80)
RDW: 15.9 % — ABNORMAL HIGH (ref 11.0–15.0)
Total Lymphocyte: 17.9 %
WBC mixed population: 619 cells/uL (ref 200–950)
WBC: 15.1 10*3/uL — ABNORMAL HIGH (ref 3.8–10.8)

## 2017-07-01 NOTE — Patient Instructions (Addendum)
Standing Labs We placed an order today for your standing lab work.    Please come back and get your standing labs in August and every 3 months TB Gold with  next labs  We have open lab Monday through Friday from 8:30-11:30 AM and 1:30-4:00 PM  at the office of Dr. Bo Merino.   You may experience shorter wait times on Monday and Friday afternoons. The office is located at [address], [place], [place], [place] [address] No appointment is necessary.   Labs are drawn by [facility].  You may receive a bill from [facility] for your lab work. If you have any questions regarding directions or hours of operation,  please call [phone].

## 2017-07-02 LAB — COMPLETE METABOLIC PANEL WITH GFR
AG RATIO: 1.4 (calc) (ref 1.0–2.5)
ALBUMIN MSPROF: 3.8 g/dL (ref 3.6–5.1)
ALT: 18 U/L (ref 9–46)
AST: 19 U/L (ref 10–35)
Alkaline phosphatase (APISO): 62 U/L (ref 40–115)
BUN/Creatinine Ratio: 15 (calc) (ref 6–22)
BUN: 10 mg/dL (ref 7–25)
CO2: 31 mmol/L (ref 20–32)
CREATININE: 0.65 mg/dL — AB (ref 0.70–1.25)
Calcium: 9.4 mg/dL (ref 8.6–10.3)
Chloride: 102 mmol/L (ref 98–110)
GFR, EST AFRICAN AMERICAN: 119 mL/min/{1.73_m2} (ref >=60)
GFR, Est Non African American: 103 mL/min/{1.73_m2} (ref >=60)
GLOBULIN: 2.7 g/dL (ref 1.9–3.7)
GLUCOSE: 98 mg/dL (ref 65–99)
POTASSIUM: 4.3 mmol/L (ref 3.5–5.3)
SODIUM: 142 mmol/L (ref 135–146)
Total Bilirubin: 0.6 mg/dL (ref 0.2–1.2)
Total Protein: 6.5 g/dL (ref 6.1–8.1)

## 2017-07-02 NOTE — Progress Notes (Signed)
Labs are stable.

## 2017-07-17 ENCOUNTER — Telehealth: Payer: Self-pay | Admitting: Rheumatology

## 2017-07-17 DIAGNOSIS — D508 Other iron deficiency anemias: Secondary | ICD-10-CM

## 2017-07-17 MED ORDER — AZATHIOPRINE 50 MG PO TABS
50.0000 mg | ORAL_TABLET | Freq: Two times a day (BID) | ORAL | 0 refills | Status: DC
Start: 2017-07-17 — End: 2017-08-08

## 2017-07-17 NOTE — Telephone Encounter (Signed)
Patient's wife [name] called stating that Dr. Estanislado Pandy stated  she would send a prescription of Azathioprine to [facility] and they still have not received a call that it is ready to pick up.

## 2017-07-17 NOTE — Telephone Encounter (Signed)
Last visit: 07/01/17 Next visit: 12/02/17 Labs: 07/01/17 stable   Okay to refill per Dr. Estanislado Pandy

## 2017-07-22 ENCOUNTER — Other Ambulatory Visit (HOSPITAL_COMMUNITY): Payer: Self-pay | Admitting: *Deleted

## 2017-07-22 DIAGNOSIS — D508 Other iron deficiency anemias: Secondary | ICD-10-CM

## 2017-07-23 ENCOUNTER — Inpatient Hospital Stay (HOSPITAL_COMMUNITY): Payer: Medicare Other | Attending: Hematology

## 2017-07-23 DIAGNOSIS — M069 Rheumatoid arthritis, unspecified: Secondary | ICD-10-CM | POA: Insufficient documentation

## 2017-07-23 DIAGNOSIS — I10 Essential (primary) hypertension: Secondary | ICD-10-CM | POA: Diagnosis not present

## 2017-07-23 DIAGNOSIS — Z87891 Personal history of nicotine dependence: Secondary | ICD-10-CM | POA: Insufficient documentation

## 2017-07-23 DIAGNOSIS — I252 Old myocardial infarction: Secondary | ICD-10-CM | POA: Diagnosis not present

## 2017-07-23 DIAGNOSIS — Z79899 Other long term (current) drug therapy: Secondary | ICD-10-CM | POA: Insufficient documentation

## 2017-07-23 DIAGNOSIS — D508 Other iron deficiency anemias: Secondary | ICD-10-CM | POA: Insufficient documentation

## 2017-07-23 DIAGNOSIS — Z7952 Long term (current) use of systemic steroids: Secondary | ICD-10-CM | POA: Insufficient documentation

## 2017-07-23 DIAGNOSIS — D72829 Elevated white blood cell count, unspecified: Secondary | ICD-10-CM | POA: Diagnosis not present

## 2017-07-23 DIAGNOSIS — D638 Anemia in other chronic diseases classified elsewhere: Secondary | ICD-10-CM | POA: Insufficient documentation

## 2017-07-23 DIAGNOSIS — L4050 Arthropathic psoriasis, unspecified: Secondary | ICD-10-CM | POA: Insufficient documentation

## 2017-07-23 LAB — CBC WITH DIFFERENTIAL/PLATELET
BASOS ABS: 0 10*3/uL (ref 0.0–0.1)
Basophils Relative: 0 %
EOS PCT: 1 %
Eosinophils Absolute: 0.1 10*3/uL (ref 0.0–0.7)
HEMATOCRIT: 44.7 % (ref 39.0–52.0)
Hemoglobin: 14.5 g/dL (ref 13.0–17.0)
LYMPHS ABS: 3.1 10*3/uL (ref 0.7–4.0)
Lymphocytes Relative: 17 %
MCH: 27.7 pg (ref 26.0–34.0)
MCHC: 32.4 g/dL (ref 30.0–36.0)
MCV: 85.5 fL (ref 78.0–100.0)
MONO ABS: 1.2 10*3/uL — AB (ref 0.1–1.0)
MONOS PCT: 6 %
Neutro Abs: 14 10*3/uL — ABNORMAL HIGH (ref 1.7–7.7)
Neutrophils Relative %: 76 %
PLATELETS: 216 10*3/uL (ref 150–400)
RBC: 5.23 MIL/uL (ref 4.22–5.81)
RDW: 15.6 % — AB (ref 11.5–15.5)
WBC: 18.4 10*3/uL — ABNORMAL HIGH (ref 4.0–10.5)

## 2017-07-23 LAB — COMPREHENSIVE METABOLIC PANEL
ALT: 20 U/L (ref 17–63)
ANION GAP: 9 (ref 5–15)
AST: 23 U/L (ref 15–41)
Albumin: 3.6 g/dL (ref 3.5–5.0)
Alkaline Phosphatase: 60 U/L (ref 38–126)
BILIRUBIN TOTAL: 0.7 mg/dL (ref 0.3–1.2)
BUN: 18 mg/dL (ref 6–20)
CHLORIDE: 103 mmol/L (ref 101–111)
CO2: 31 mmol/L (ref 22–32)
Calcium: 9.6 mg/dL (ref 8.9–10.3)
Creatinine, Ser: 0.83 mg/dL (ref 0.61–1.24)
Glucose, Bld: 106 mg/dL — ABNORMAL HIGH (ref 65–99)
Potassium: 3.9 mmol/L (ref 3.5–5.1)
Sodium: 143 mmol/L (ref 135–145)
TOTAL PROTEIN: 7.2 g/dL (ref 6.5–8.1)

## 2017-07-24 ENCOUNTER — Other Ambulatory Visit: Payer: Self-pay | Admitting: Rheumatology

## 2017-07-24 DIAGNOSIS — D508 Other iron deficiency anemias: Secondary | ICD-10-CM

## 2017-07-27 ENCOUNTER — Other Ambulatory Visit: Payer: Self-pay | Admitting: Internal Medicine

## 2017-07-29 NOTE — Telephone Encounter (Signed)
Rx sent to pharmacy   

## 2017-07-29 NOTE — Telephone Encounter (Signed)
This is Dr. Berry's pt 

## 2017-07-30 ENCOUNTER — Encounter (HOSPITAL_COMMUNITY): Payer: Self-pay | Admitting: Internal Medicine

## 2017-07-30 ENCOUNTER — Inpatient Hospital Stay (HOSPITAL_BASED_OUTPATIENT_CLINIC_OR_DEPARTMENT_OTHER): Payer: Medicare Other | Admitting: Internal Medicine

## 2017-07-30 ENCOUNTER — Other Ambulatory Visit: Payer: Self-pay

## 2017-07-30 VITALS — BP 146/61 | HR 46 | Temp 98.2°F | Resp 18 | Wt 191.3 lb

## 2017-07-30 DIAGNOSIS — D638 Anemia in other chronic diseases classified elsewhere: Secondary | ICD-10-CM | POA: Diagnosis not present

## 2017-07-30 DIAGNOSIS — M069 Rheumatoid arthritis, unspecified: Secondary | ICD-10-CM

## 2017-07-30 DIAGNOSIS — D72829 Elevated white blood cell count, unspecified: Secondary | ICD-10-CM | POA: Diagnosis not present

## 2017-07-30 DIAGNOSIS — Z7952 Long term (current) use of systemic steroids: Secondary | ICD-10-CM | POA: Diagnosis not present

## 2017-07-30 DIAGNOSIS — Z87891 Personal history of nicotine dependence: Secondary | ICD-10-CM | POA: Diagnosis not present

## 2017-07-30 DIAGNOSIS — I252 Old myocardial infarction: Secondary | ICD-10-CM

## 2017-07-30 DIAGNOSIS — L4050 Arthropathic psoriasis, unspecified: Secondary | ICD-10-CM | POA: Diagnosis not present

## 2017-07-30 DIAGNOSIS — D508 Other iron deficiency anemias: Secondary | ICD-10-CM | POA: Diagnosis not present

## 2017-07-30 DIAGNOSIS — D72823 Leukemoid reaction: Secondary | ICD-10-CM

## 2017-07-30 DIAGNOSIS — Z79899 Other long term (current) drug therapy: Secondary | ICD-10-CM | POA: Diagnosis not present

## 2017-07-30 DIAGNOSIS — I10 Essential (primary) hypertension: Secondary | ICD-10-CM | POA: Diagnosis not present

## 2017-07-30 NOTE — Patient Instructions (Signed)
[facility] at [facility] Discharge Instructions  Today you saw Dr. Higgs.    Thank you for choosing [facility] at [facility] to provide your oncology and hematology care.  To afford each patient quality time with our provider, please arrive at least 15 minutes before your scheduled appointment time.   If you have a lab appointment with the Cancer Center please come in thru the  Main Entrance and check in at the main information desk  You need to re-schedule your appointment should you arrive 10 or more minutes late.  We strive to give you quality time with our providers, and arriving late affects you and other patients whose appointments are after yours.  Also, if you no show three or more times for appointments you may be dismissed from the clinic at the providers discretion.     Again, thank you for choosing [facility].  Our hope is that these requests will decrease the amount of time that you wait before being seen by our physicians.       _____________________________________________________________  Should you have questions after your visit to [facility], please contact our office at [phone] between the hours of 8:30 a.m. and 4:30 p.m.  Voicemails left after 4:30 p.m. will not be returned until the following business day.  For prescription refill requests, have your pharmacy contact our office.       Resources For Cancer Patients and their Caregivers ? American Cancer Society: Can assist with transportation, wigs, general needs, runs Look Good Feel Better.        1-888-227-6333 ? Cancer Care: Provides financial assistance, online support groups, medication/co-pay assistance.  1-800-813-HOPE (4673) ? [facility] Assists [place] cancer patients and their families through emotional , educational and financial support.  336-427-4357 ? [place] DSS Where to apply for  food stamps, Medicaid and utility assistance. 336-342-1394 ? [facility]: Transportation to medical appointments. 336-347-2287 ? Social Security Administration: May apply for disability if have a Stage IV cancer. 336-342-7796 1-800-772-1213 ? [place] Aging, Disability and Transit Services: Assists with nutrition, care and transit needs. 336-349-2343  Cancer Center Support Programs:   > Cancer Support Group  2nd Tuesday of the month 1pm-2pm, Journey Room   > Creative Journey  3rd Tuesday of the month 1130am-1pm, Journey Room    

## 2017-07-30 NOTE — Progress Notes (Signed)
Diagnosis Other iron deficiency anemia - Plan: CBC with Differential/Platelet, Comprehensive metabolic panel, Lactate dehydrogenase, Ferritin, Sedimentation rate, JAK2 V617F, w Reflex to CALR/E12/MPL  Leukemoid reaction - Plan: CBC with Differential/Platelet, Comprehensive metabolic panel, Lactate dehydrogenase, Ferritin, Sedimentation rate, JAK2 V617F, w Reflex to CALR/E12/MPL  Staging Cancer Staging No matching staging information was found for the patient.  Assessment and Plan:  1.  Leukocytosis.  Pt was previously followed by Dr. Talbert Cage. He had a Negative workup in 2013, including negative bone marrow biopsy. Peripheral flow cytometry 2016 negative for monoclonal B cell population.  He is on chronic prednisone and leukocytosis is felt to be reactive likely due to steroid use.   Pt had negative BCR/ABL.  Will repeat labs in 6 months.  Will check Jak 2.   He will follow-up at that time to go over results.    2.  Rheumatoid arthritis/Psoriatic arthritis.  Pt remains on prednisone.  Follow-up with PCP or rheumatologist as directed.    3.  Anemia of chronic disease.  Labs done 07/23/2017 show WBC 18.4, HB 14.5 plts 216,000.  Will repeat labs and ferritin on RTC.    4.  Hypertension:  BP 146/61.  Follow-up with PCP for monitoring.    5.  CAD. This was noted on scans done in 2017.  He reports prior heart attack and is followed by cardiology.    6.  Pulmonary fibrosis.  Follow-up with pulmonary as directed.    Current Status:  Pt is seen today for follow-up.  He is here to go over labs.  He remains on Prednisone.    Interpretation [facility]):  Comment   Comments: (NOTE)  The quantitative RT-PCR assay is negative for the b2a2 and b3a2  (p210) and e1a2 (p190) fusion gene transcripts found in chronic  myelogenous leukemia and [place] positive acute lymphocytic  leukemia. These results do not rule out the presence of low levels of  BCR-ABL1 transcript below the level of detection of this  assay, or  the presence of rare BCR-ABL1 transcripts not detected by this assay.        Pathologist smear review  Status: Finalresult Visible to patient:  Not Released Nextappt: 05/09/2015 at 12:20 PM in Oncology ([facility]) Dx:  Leukocytosis         67moago    Path Review Reviewed By BViolet Baldy M.D.   Comments: 08.08.16  NORMOCYTIC ANEMIA,  MILD LEUKOCYTOSIS.  [email or web address]      Specimen Collected: 10/07/14 1:26 PM Last Resulted: 10/10/14 10:31 AM           PATHOLOGY:    04/09/2014  Diagnosis Bone Marrow, Aspirate,Biopsy, and Clot, left posterior iliac crest - NORMOCELLULAR BONE MARROW FOR AGE WITH TRILINEAGE HEMATOPOIESIS. - A FEW SMALL LYMPHOID AGGREGATES PRESENT. - INCREASED IRON STORES.  FLOW CYTOMETRY REPORT INTERPRETATION Interpretation Peripheral Blood Flow Cytometry - PREDOMINANCE OF T LYMPHOCYTES WITH NO ABERRANT PHENOTYPE. - MINOR B-CELL POPULATION IDENTIFIED. Diagnosis Comment: The majority of lymphocytes consists of T cells with no aberrant phenotype. B cells represent a minor population (less than 10% of lymphocytes) with extremely dim staining for surface immunoglobulin light chains that hinder assessment and/or quantitation of clonality. However, an abnormal B-cell phenotype such as expression of CD10 or CD5 is not identified. Clinical correlation is recommended. (BNS:ecj 10/11/2014) BSusanne GreenhouseMD Pathologist, Electronic Signature (Case signed 10/11/2014)   Problem List Patient Active Problem List   Diagnosis Date Noted  .  Ischemic cardiomyopathy [I25.5] 12/04/2016  . Leukocytosis [D72.829] 07/17/2016  . Pain in joint involving multiple sites [M25.50] 04/25/2016  . Psoriatic arthritis ([place]) [L40.50] 02/14/2016  . High risk medication use [Z79.899] 02/14/2016  . NICM (nonischemic cardiomyopathy) ([place]) [I42.8] 08/29/2015  . Essential hypertension [I10]  08/29/2015  . Abnormal CT scan, stomach [R93.3]   . Hiatal hernia [K44.9]   . GERD (gastroesophageal reflux disease) [K21.9] 08/01/2015  . Weight loss, unintentional [R63.4] 08/01/2015  . Iron deficiency anemia [D50.9] 06/06/2015  . Heme positive stool [R19.5] 06/06/2015  . PAF (paroxysmal atrial fibrillation) ([place]) [I48.0] 05/30/2015  . CAD S/P percutaneous coronary angioplasty [I25.10, Z98.61] 04/07/2015  . Atrial flutter ([place]) [I48.92] 04/07/2015  . 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 Acute on chronic systolic congestive heart failure ([place]) [I50.23] 04/07/2015  . LBBB (left bundle branch block) [I44.7] 04/07/2015  . Wide-complex tachycardia ([place]) [I47.2] 04/07/2015  . Hyperlipidemia [E78.5] 03/07/2015  . Hypertension [I10]   . Psoriasis [L40.9]   . Rheumatoid arthritis ([place]) [M06.9]   . Pulmonary fibrosis [facility]) [J84.10] 03/21/2011    Past Medical History Past Medical History:  Diagnosis Date  . Abnormal CT scan, stomach   . Chronic systolic dysfunction of left ventricle   . Coronary artery disease    lateral STEMI 02/22/15  . GERD (gastroesophageal reflux disease)   . Hyperlipidemia   . Hypertension   . Ischemic cardiomyopathy   . LBBB (left bundle branch block)   . Leukocytosis    followed by hematology, reactive  . Lymphadenopathy   . Myocardial infarction ([place]) 02/2015  . Psoriasis 2003  . Psoriatic arthritis ([place])   . Pulmonary fibrosis ([place])   . Rheumatoid arthritis(714.0) 2012  . Typical atrial flutter [facility])     Past Surgical History Past Surgical History:  Procedure Laterality Date  . CARDIAC CATHETERIZATION N/A 02/22/2015   Procedure: Left Heart Cath and Coronary Angiography;  Surgeon: Lorretta Harp, MD;  Location: [place] CV LAB;  Service: Cardiovascular;  Laterality: N/A;  . CARDIAC CATHETERIZATION N/A 02/22/2015   Procedure: Coronary Stent Intervention;  Surgeon: Lorretta Harp, MD;  Location: [place] CV LAB;   Service: Cardiovascular;  Laterality: N/A;  . CARDIOVERSION N/A 04/12/2015   Procedure: CARDIOVERSION;  Surgeon: Larey Dresser, MD;  Location: [facility] ENDOSCOPY;  Service: Cardiovascular;  Laterality: N/A;  . COLONOSCOPY  07/01/2003   UGQ:BVQXIH colonic mucosa except for the proximal right colon in the area of ICV which was not seen completely due to inadequate bowel prep. followed with ACBE which was normal.   . COLONOSCOPY N/A 08/24/2015   Dr. Gala Romney: Normal colon. Next colonoscopy in 10 years.  [name] ELECTROPHYSIOLOGIC STUDY N/A 05/30/2015   Atrial fibrillation ablation by Dr Rayann Heman  . ESOPHAGOGASTRODUODENOSCOPY N/A 08/24/2015   Dr. Gala Romney: Medium-sized hiatal hernia, erosive gastropathy. Cameron lesions. Esophageal mucosa distally suggestive of short segment Barrett's esophagus. Not confirmed on biopsy. Gastric biopsy with minimal chronic inflammation  . GIVENS CAPSULE STUDY N/A 04/17/2016   Procedure: GIVENS CAPSULE STUDY;  Surgeon: Daneil Dolin, MD;  [facility];  Service: Endoscopy;  Laterality: N/A;  Pt to arrive at 8:00 am for 8:30 am appt  . TEE WITHOUT CARDIOVERSION N/A 04/12/2015   Procedure: TRANSESOPHAGEAL ECHOCARDIOGRAM (TEE);  Surgeon: Larey Dresser, MD;  Location: [facility] ENDOSCOPY;  Service: Cardiovascular;  Laterality: N/A;    Family History Family History  Problem Relation Age of Onset  . Hypertension Mother   . Colon cancer Neg Hx  Social History  reports that he quit smoking about 14 years ago. His smoking use included cigarettes. He has a 30.00 pack-year smoking history. He has never used smokeless tobacco. He reports that he does not drink alcohol or use drugs.  Medications  Current Outpatient Medications:  .  Abatacept (ORENCIA CLICKJECT) 323 MG/ML SOAJ, Inject 125 mg into the skin once a week., Disp: 4 Syringe, Rfl: 2 .  atorvastatin (LIPITOR) 80 MG tablet, Take 1 tablet (80 mg total) by mouth daily at 6 PM., Disp: 90 tablet, Rfl: 3 .  carvedilol (COREG) 12.5 MG  tablet, Take 12.5 mg by mouth 2 (two) times daily with a meal., Disp: , Rfl:  .  Clobetasol Prop Emollient Base (CLOBETASOL PROPIONATE E) 0.05 % emollient cream, Apply to affected area BID, Disp: 30 g, Rfl: 2 .  clopidogrel (PLAVIX) 75 MG tablet, Take 1 tablet (75 mg total) by mouth daily., Disp: 90 tablet, Rfl: 2 .  clopidogrel (PLAVIX) 75 MG tablet, Take 75 mg by mouth daily., Disp: , Rfl:  .  furosemide (LASIX) 40 MG tablet, Take 1 tablet (40 mg total) by mouth 2 (two) times daily., Disp: 180 tablet, Rfl: 2 .  losartan (COZAAR) 100 MG tablet, TAKE 1 TABLET BY MOUTH EVERY DAY, Disp: 90 tablet, Rfl: 1 .  pantoprazole (PROTONIX) 40 MG tablet, Take 1 tablet (40 mg total) by mouth 2 (two) times daily before a meal., Disp: 180 tablet, Rfl: 2 .  predniSONE (DELTASONE) 10 MG tablet, Take 10 mg by mouth daily with breakfast., Disp: , Rfl:  .  azaTHIOprine (IMURAN) 50 MG tablet, Take 1 tablet (50 mg total) by mouth 2 (two) times daily. (Patient not taking: Reported on 07/30/2017), Disp: 180 tablet, Rfl: 0 .  ranitidine (ZANTAC) 300 MG tablet, Take by mouth., Disp: , Rfl:   Allergies Patient has no known allergies.  Review of Systems Review of Systems - Oncology ROS as per HPI otherwise 12 point ROS is negative.   Physical Exam  Vitals Wt Readings from Last 3 Encounters:  07/30/17 191 lb 4.8 oz (86.8 kg)  07/01/17 191 lb (86.6 kg)  03/27/17 186 lb (84.4 kg)   Temp Readings from Last 3 Encounters:  07/30/17 98.2 F (36.8 C) (Oral)  01/30/17 98.9 F (37.2 C) (Oral)  01/21/17 97.8 F (36.6 C) (Oral)   BP Readings from Last 3 Encounters:  07/30/17 (!) 146/61  07/01/17 130/62  03/27/17 (!) 150/72   Pulse Readings from Last 3 Encounters:  07/30/17 (!) 46  07/01/17 (!) 48  03/27/17 (!) 59   Constitutional: Well-developed, well-nourished, and in no distress.   HENT: Head: Normocephalic and atraumatic.  Mouth/Throat: No oropharyngeal exudate. Mucosa moist. Eyes: Pupils are equal, round,  and reactive to light. Conjunctivae are normal. No scleral icterus.  Neck: Normal range of motion. Neck supple. No JVD present.  Cardiovascular: Normal rate, regular rhythm and normal heart sounds.  Exam reveals no gallop and no friction rub.   No murmur heard. Pulmonary/Chest: Effort normal.  Coarse breath sounds.   Abdominal: Soft. Bowel sounds are normal. No distension. There is no tenderness. There is no guarding.  Musculoskeletal: No edema or tenderness.  Lymphadenopathy: No cervical, axillary or supraclavicular adenopathy.  Neurological: Alert and oriented to person, place, and time. No cranial nerve deficit.  Skin: Skin is warm and dry. No rash noted. No erythema. No pallor.  Psychiatric: Affect and judgment normal.   Labs No visits with results within 3 Day(s) from this visit.  Latest  known visit with results is:  Appointment on 07/23/2017  Component Date Value Ref Range Status  . WBC 07/23/2017 18.4* 4.0 - 10.5 K/uL Final  . RBC 07/23/2017 5.23  4.22 - 5.81 MIL/uL Final  . Hemoglobin 07/23/2017 14.5  13.0 - 17.0 g/dL Final  . HCT 07/23/2017 44.7  39.0 - 52.0 % Final  . MCV 07/23/2017 85.5  78.0 - 100.0 fL Final  . MCH 07/23/2017 27.7  26.0 - 34.0 pg Final  . MCHC 07/23/2017 32.4  30.0 - 36.0 g/dL Final  . RDW 07/23/2017 15.6* 11.5 - 15.5 % Final  . Platelets 07/23/2017 216  150 - 400 K/uL Final  . Neutrophils Relative % 07/23/2017 76  % Final  . Neutro Abs 07/23/2017 14.0* 1.7 - 7.7 K/uL Final  . Lymphocytes Relative 07/23/2017 17  % Final  . Lymphs Abs 07/23/2017 3.1  0.7 - 4.0 K/uL Final  . Monocytes Relative 07/23/2017 6  % Final  . Monocytes Absolute 07/23/2017 1.2* 0.1 - 1.0 K/uL Final  . Eosinophils Relative 07/23/2017 1  % Final  . Eosinophils Absolute 07/23/2017 0.1  0.0 - 0.7 K/uL Final  . Basophils Relative 07/23/2017 0  % Final  . Basophils Absolute 07/23/2017 0.0  0.0 - 0.1 K/uL Final   Performed at [facility], [address], [place], [place] [address]  .  Sodium 07/23/2017 143  135 - 145 mmol/L Final  . Potassium 07/23/2017 3.9  3.5 - 5.1 mmol/L Final  . Chloride 07/23/2017 103  101 - 111 mmol/L Final  . CO2 07/23/2017 31  22 - 32 mmol/L Final  . Glucose, Bld 07/23/2017 106* 65 - 99 mg/dL Final  . BUN 07/23/2017 18  6 - 20 mg/dL Final  . Creatinine, Ser 07/23/2017 0.83  0.61 - 1.24 mg/dL Final  . Calcium 07/23/2017 9.6  8.9 - 10.3 mg/dL Final  . Total Protein 07/23/2017 7.2  6.5 - 8.1 g/dL Final  . Albumin 07/23/2017 3.6  3.5 - 5.0 g/dL Final  . AST 07/23/2017 23  15 - 41 U/L Final  . ALT 07/23/2017 20  17 - 63 U/L Final  . Alkaline Phosphatase 07/23/2017 60  38 - 126 U/L Final  . Total Bilirubin 07/23/2017 0.7  0.3 - 1.2 mg/dL Final  . GFR calc non Af Amer 07/23/2017 >60  >60 mL/min Final  . GFR calc Af Amer 07/23/2017 >60  >60 mL/min Final   Comment: (NOTE) The eGFR has been calculated using the CKD EPI equation. This calculation has not been validated in all clinical situations. eGFR's persistently <60 mL/min signify possible Chronic Kidney Disease.   Georgiann Hahn gap 07/23/2017 9  5 - 15 Final   Performed at [facility], [address], [place], [place] [address]     Pathology Orders Placed This Encounter  Procedures  . CBC with Differential/Platelet    Standing Status:   Future    Standing Expiration Date:   07/31/2018  . Comprehensive metabolic panel    Standing Status:   Future    Standing Expiration Date:   07/31/2018  . Lactate dehydrogenase    Standing Status:   Future    Standing Expiration Date:   07/31/2018  . Ferritin    Standing Status:   Future    Standing Expiration Date:   07/31/2018  . Sedimentation rate    Standing Status:   Future    Standing Expiration Date:   07/31/2018  . JAK2 V617F, w Reflex to CALR/E12/MPL  Standing Status:   Future    Standing Expiration Date:   07/31/2018       Zoila Shutter MD

## 2017-08-01 ENCOUNTER — Other Ambulatory Visit: Payer: Self-pay | Admitting: *Deleted

## 2017-08-01 MED ORDER — CLOPIDOGREL BISULFATE 75 MG PO TABS: 75.0000 mg | ORAL_TABLET | Freq: Every day | ORAL | 1 refills | Status: DC

## 2017-08-08 ENCOUNTER — Other Ambulatory Visit: Payer: Self-pay | Admitting: *Deleted

## 2017-08-08 ENCOUNTER — Telehealth: Payer: Self-pay | Admitting: Rheumatology

## 2017-08-08 DIAGNOSIS — D508 Other iron deficiency anemias: Secondary | ICD-10-CM

## 2017-08-08 MED ORDER — AZATHIOPRINE 50 MG PO TABS: 50.0000 mg | ORAL_TABLET | Freq: Two times a day (BID) | ORAL | 0 refills | Status: DC

## 2017-08-08 MED ORDER — ABATACEPT 125 MG/ML ~~LOC~~ SOAJ: 125.0000 mg | SUBCUTANEOUS | 0 refills | Status: DC

## 2017-08-08 NOTE — Telephone Encounter (Signed)
Patient's wife left a voicemail requsting prescription refill of Orencia.

## 2017-08-08 NOTE — Telephone Encounter (Signed)
Refill request received via fax  Last Visit: 07/01/17 Next Visit: 12/02/17 Labs: 07/23/17 Stable  Okay to refill per Dr. Estanislado Pandy

## 2017-08-08 NOTE — Telephone Encounter (Signed)
Last Visit: 07/01/17 Next Visit: 12/02/17 Labs: 07/23/17 Stable TB Gold: 09/19/16 Neg   Okay to refill per Dr. Estanislado Pandy Faxed to [facility]

## 2017-08-12 NOTE — Telephone Encounter (Signed)
Per patient Curtis Flores does not have Orencia rx, and patient took last injection Friday. Please resend. Please contact patient when resend.

## 2017-08-13 NOTE — Telephone Encounter (Signed)
Prescription refaxed.

## 2017-08-21 ENCOUNTER — Telehealth: Payer: Self-pay | Admitting: Rheumatology

## 2017-08-21 NOTE — Telephone Encounter (Signed)
Patient's wife Curtis Flores called stating Curtis Flores has not received his Orencia.  She is requesting a return call.

## 2017-08-21 NOTE — Telephone Encounter (Signed)
Patient states he contacted the [facility] and they advised patient they have not received the prescription. Contacted the foundation and they received the prescription on 08/08/17. They states the doctor's license was expired and they failed to contact the office to get any updated information. Gave a verbal prescription to the pharmacist [place].

## 2017-08-22 ENCOUNTER — Telehealth: Payer: Self-pay | Admitting: Rheumatology

## 2017-08-22 NOTE — Telephone Encounter (Signed)
[name] called to let you know that Curtis Flores received his Orencia this morning.

## 2017-09-06 ENCOUNTER — Other Ambulatory Visit: Payer: Self-pay | Admitting: Cardiovascular Disease

## 2017-09-06 ENCOUNTER — Other Ambulatory Visit: Payer: Self-pay | Admitting: Rheumatology

## 2017-09-08 NOTE — Telephone Encounter (Signed)
Rx sent to pharmacy   

## 2017-09-08 NOTE — Telephone Encounter (Signed)
Last visit: 07/01/2017 Next visit: 12/02/2017  Okay to refill per Dr. Estanislado Pandy.

## 2017-10-14 ENCOUNTER — Telehealth: Payer: Self-pay

## 2017-10-14 DIAGNOSIS — Z79899 Other long term (current) drug therapy: Secondary | ICD-10-CM

## 2017-10-14 MED ORDER — ABATACEPT 125 MG/ML ~~LOC~~ SOAJ: 125.0000 mg | SUBCUTANEOUS | 0 refills | Status: DC

## 2017-10-14 NOTE — Telephone Encounter (Signed)
Refill request received via fax.   Last visit: 07/01/2017 Next visit: 12/02/2017 Labs: 07/23/2017 WBC 18.4 (patient is on prednisone) TB Gold: 09/19/2016 Negative   Left message on machine to advise patient he is due to update TB Gold.  Okay to refill per Dr. Estanislado Pandy.  Faxed to [facility].

## 2017-10-29 ENCOUNTER — Telehealth: Payer: Self-pay | Admitting: Rheumatology

## 2017-10-29 ENCOUNTER — Other Ambulatory Visit: Payer: Self-pay | Admitting: *Deleted

## 2017-10-29 DIAGNOSIS — Z79899 Other long term (current) drug therapy: Secondary | ICD-10-CM

## 2017-10-29 NOTE — Telephone Encounter (Signed)
Patient called requesting labwork orders be faxed to [facility].  Patient will be going tomorrow 8/29.      Fax [phone]

## 2017-10-29 NOTE — Telephone Encounter (Signed)
Lab orders released and faxed.  

## 2017-10-30 LAB — COMPLETE METABOLIC PANEL WITH GFR
AG RATIO: 1.2 (calc) (ref 1.0–2.5)
ALT: 18 U/L (ref 9–46)
AST: 21 U/L (ref 10–35)
Albumin: 3.5 g/dL — ABNORMAL LOW (ref 3.6–5.1)
Alkaline phosphatase (APISO): 57 U/L (ref 40–115)
BUN: 12 mg/dL (ref 7–25)
CALCIUM: 9.5 mg/dL (ref 8.6–10.3)
CO2: 30 mmol/L (ref 20–32)
Chloride: 101 mmol/L (ref 98–110)
Creat: 0.77 mg/dL (ref 0.70–1.25)
GFR, EST NON AFRICAN AMERICAN: 96 mL/min/{1.73_m2} (ref >=60)
GFR, Est African American: 111 mL/min/{1.73_m2} (ref >=60)
GLUCOSE: 155 mg/dL — AB (ref 65–139)
Globulin: 3 g/dL (calc) (ref 1.9–3.7)
POTASSIUM: 4.3 mmol/L (ref 3.5–5.3)
Sodium: 140 mmol/L (ref 135–146)
Total Bilirubin: 0.5 mg/dL (ref 0.2–1.2)
Total Protein: 6.5 g/dL (ref 6.1–8.1)

## 2017-10-30 LAB — CBC WITH DIFFERENTIAL/PLATELET
Basophils Absolute: 63 cells/uL (ref 0–200)
Basophils Relative: 0.4 %
EOS ABS: 47 {cells}/uL (ref 15–500)
EOS PCT: 0.3 %
HCT: 42.1 % (ref 38.5–50.0)
HEMOGLOBIN: 14 g/dL (ref 13.2–17.1)
Lymphs Abs: 2512 cells/uL (ref 850–3900)
MCH: 27.8 pg (ref 27.0–33.0)
MCHC: 33.3 g/dL (ref 32.0–36.0)
MCV: 83.5 fL (ref 80.0–100.0)
MONOS PCT: 3.4 %
MPV: 12.7 fL — AB (ref 7.5–12.5)
NEUTROS ABS: 12640 {cells}/uL — AB (ref 1500–7800)
Neutrophils Relative %: 80 %
Platelets: 257 10*3/uL (ref 140–400)
RBC: 5.04 10*6/uL (ref 4.20–5.80)
RDW: 13.6 % (ref 11.0–15.0)
Total Lymphocyte: 15.9 %
WBC mixed population: 537 cells/uL (ref 200–950)
WBC: 15.8 10*3/uL — ABNORMAL HIGH (ref 3.8–10.8)

## 2017-10-31 NOTE — Progress Notes (Signed)
Glu is elevated. Labs are stable.

## 2017-11-12 ENCOUNTER — Other Ambulatory Visit: Payer: Self-pay | Admitting: Nurse Practitioner

## 2017-11-18 NOTE — Progress Notes (Signed)
Office Visit Note  Patient: Curtis Flores             Date of Birth: 1955-08-29           MRN: [id number]             PCP: Dione Housekeeper, MD Referring: Dione Housekeeper, MD Visit Date: 12/02/2017 Occupation: @GUAROCC @  Subjective:  Medication monitoring   History of Present Illness: Curtis Flores is a 62 y.o. male with history of psoriatic arthritis and rheumatoid arthritis overlap.  He is on Orencia subcutaneous injections once weekly, Imuran 100 mg by mouth daily, prednisone 10 mg by mouth daily.  He states he has occasional flares in both hands.  He states his right hand is typically more inflamed.  He has occasional discomfort in bilateral elbow joints.  He denies any plantar fasciitis or Achilles tendinitis at this time.  He denies any SI joint pain.  He continues to have chronic neck pain and stiffness but denies any symptoms of radiculopathy at this time.  He has intermittent discomfort in bilateral ankles and bilateral feet but denies any joint swelling at this time.  He states that he has not followed up with Dr. Elsworth Soho in 2019 yet, but he plans on scheduling an appointment.     Activities of Daily Living:  Patient reports morning stiffness for a couple of hours.   Patient Denies nocturnal pain.  Difficulty dressing/grooming: Denies Difficulty climbing stairs: Denies Difficulty getting out of chair: Denies Difficulty using hands for taps, buttons, cutlery, and/or writing: Reports  Review of Systems  Constitutional: Negative for fatigue and night sweats.  HENT: Positive for mouth dryness. Negative for mouth sores, trouble swallowing, trouble swallowing and nose dryness.   Eyes: Positive for dryness. Negative for pain and redness.  Respiratory: Negative for cough, hemoptysis, shortness of breath and difficulty breathing.   Cardiovascular: Negative for chest pain, palpitations, hypertension, irregular heartbeat and swelling in legs/feet.  Gastrointestinal: Negative for  abdominal pain, constipation, diarrhea, nausea and vomiting.  Endocrine: Negative for increased urination.  Genitourinary: Negative for painful urination, pelvic pain and urgency.  Musculoskeletal: Positive for arthralgias, joint pain, joint swelling and morning stiffness. Negative for myalgias, muscle weakness, muscle tenderness and myalgias.  Skin: Negative for color change, rash, hair loss, nodules/bumps, skin tightness, ulcers and sensitivity to sunlight.  Allergic/Immunologic: Negative for susceptible to infections.  Neurological: Negative for dizziness, fainting, light-headedness, headaches, memory loss, night sweats and weakness.  Hematological: Negative for bruising/bleeding tendency and swollen glands.  Psychiatric/Behavioral: Negative for depressed mood, confusion and sleep disturbance. The patient is not nervous/anxious.     PMFS History:  Patient Active Problem List   Diagnosis Date Noted  . Ischemic cardiomyopathy 12/04/2016  . Leukocytosis 07/17/2016  . Pain in joint involving multiple sites 04/25/2016  . Psoriatic arthritis ([place]) 02/14/2016  . High risk medication use 02/14/2016  . NICM (nonischemic cardiomyopathy) ([place]) 08/29/2015  . Essential hypertension 08/29/2015  . Abnormal CT scan, stomach   . Hiatal hernia   . GERD (gastroesophageal reflux disease) 08/01/2015  . Weight loss, unintentional 08/01/2015  . Iron deficiency anemia 06/06/2015  . Heme positive stool 06/06/2015  . PAF (paroxysmal atrial fibrillation) ([place]) 05/30/2015  . CAD S/P percutaneous coronary angioplasty 04/07/2015  . Atrial flutter ([place]) 04/07/2015  . 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 Acute on chronic systolic congestive heart failure ([place]) 04/07/2015  . LBBB (left bundle branch block) 04/07/2015  . Wide-complex tachycardia ([place]) 04/07/2015  . Hyperlipidemia 03/07/2015  .  Hypertension   . Psoriasis   . Rheumatoid arthritis ([place])   . Pulmonary fibrosis ([place]) 03/21/2011      Past Medical History:  Diagnosis Date  . Abnormal CT scan, stomach   . Chronic systolic dysfunction of left ventricle   . Coronary artery disease    lateral STEMI 02/22/15  . GERD (gastroesophageal reflux disease)   . Hyperlipidemia   . Hypertension   . Ischemic cardiomyopathy   . LBBB (left bundle branch block)   . Leukocytosis    followed by hematology, reactive  . Lymphadenopathy   . Myocardial infarction ([place]) 02/2015  . Psoriasis 2003  . Psoriatic arthritis ([place])   . Pulmonary fibrosis ([place])   . Rheumatoid arthritis(714.0) 2012  . Typical atrial flutter (H[facility])     Family History  Problem Relation Age of Onset  . Hypertension Mother   . Colon cancer Neg Hx    Past Surgical History:  Procedure Laterality Date  . CARDIAC CATHETERIZATION N/A 02/22/2015   Procedure: Left Heart Cath and Coronary Angiography;  Surgeon: Lorretta Harp, MD;  Location: [facility];  Service: Cardiovascular;  Laterality: N/A;  . CARDIAC CATHETERIZATION N/A 02/22/2015   Procedure: Coronary Stent Intervention;  Surgeon: Lorretta Harp, MD;  Location: [facility];  Service: Cardiovascular;  Laterality: N/A;  . CARDIOVERSION N/A 04/12/2015   Procedure: CARDIOVERSION;  Surgeon: Larey Dresser, MD;  Location: [facility] ENDOSCOPY;  Service: Cardiovascular;  Laterality: N/A;  . COLONOSCOPY  07/01/2003   YOV:ZCHYIF colonic mucosa except for the proximal right colon in the area of ICV which was not seen completely due to inadequate bowel prep. followed with ACBE which was normal.   . COLONOSCOPY N/A 08/24/2015   Dr. Gala Romney: Normal colon. Next colonoscopy in 10 years.  [name] ELECTROPHYSIOLOGIC STUDY N/A 05/30/2015   Atrial fibrillation ablation by Dr Rayann Heman  . ESOPHAGOGASTRODUODENOSCOPY N/A 08/24/2015   Dr. Gala Romney: Medium-sized hiatal hernia, erosive gastropathy. Cameron lesions. Esophageal mucosa distally suggestive of short segment Barrett's esophagus. Not confirmed on biopsy. Gastric biopsy with minimal  chronic inflammation  . GIVENS CAPSULE STUDY N/A 04/17/2016   Procedure: GIVENS CAPSULE STUDY;  Surgeon: Daneil Dolin, MD;  [facility];  Service: Endoscopy;  Laterality: N/A;  Pt to arrive at 8:00 am for 8:30 am appt  . TEE WITHOUT CARDIOVERSION N/A 04/12/2015   Procedure: TRANSESOPHAGEAL ECHOCARDIOGRAM (TEE);  Surgeon: Larey Dresser, MD;  Location: [facility] ENDOSCOPY;  Service: Cardiovascular;  Laterality: N/A;   Social History   Social History Narrative  . Not on file    Objective: Vital Signs: BP 130/60 (BP Location: Left Arm, Patient Position: Sitting, Cuff Size: Normal)   Pulse 71   Resp 15   Ht 5\' 7"  (1.702 m)   Wt 195 lb 9.6 oz (88.7 kg)   BMI 30.64 kg/m    Physical Exam  Constitutional: He is oriented to person, place, and time. He appears well-developed and well-nourished.  HENT:  Head: Normocephalic and atraumatic.  Eyes: Pupils are equal, round, and reactive to light. Conjunctivae and EOM are normal.  Neck: Normal range of motion. Neck supple.  Cardiovascular: Normal rate, regular rhythm and normal heart sounds.  Pulmonary/Chest: Effort normal and breath sounds normal.  Abdominal: Soft. Bowel sounds are normal.  Lymphadenopathy:    He has no cervical adenopathy.  Neurological: He is alert and oriented to person, place, and time.  Skin: Skin is warm and dry. Capillary refill takes less than 2 seconds.  Psychiatric:  He has a normal mood and affect. His behavior is normal.  Nursing note and vitals reviewed.    Musculoskeletal Exam: C-spine, thoracic spine, lumbar spine good range of motion.  No midline spinal tenderness.  No SI joint tenderness.  Shoulder joints and elbow joints good range of motion with no synovitis.  He has synovitis of bilateral wrist joints.  He has synovitis of several MCP and PIP joints as described below.  He has good range of motion bilateral knee joints.  Right knee warmth noted on exam.  No tenderness or swelling of ankle joints at this  time.  CDAI Exam: CDAI Score: 17  Patient Global Assessment: 5 (mm); Provider Global Assessment: 5 (mm) Swollen: 8 ; Tender: 8  Joint Exam      Right  Left  Wrist  Swollen Tender  Swollen Tender  MCP 1     Swollen Tender  MCP 2  Swollen Tender     MCP 3  Swollen Tender  Swollen Tender  MCP 5  Swollen Tender     PIP 4  Swollen Tender        Investigation: No additional findings.  Imaging: No results found.  Recent Labs: Lab Results  Component Value Date   WBC 15.8 (H) 10/30/2017   HGB 14.0 10/30/2017   PLT 257 10/30/2017   NA 140 10/30/2017   K 4.3 10/30/2017   CL 101 10/30/2017   CO2 30 10/30/2017   GLUCOSE 155 (H) 10/30/2017   BUN 12 10/30/2017   CREATININE 0.77 10/30/2017   BILITOT 0.5 10/30/2017   ALKPHOS 60 07/23/2017   AST 21 10/30/2017   ALT 18 10/30/2017   PROT 6.5 10/30/2017   ALBUMIN 3.6 07/23/2017   CALCIUM 9.5 10/30/2017   GFRAA 111 10/30/2017    Speciality Comments: No specialty comments available.  Procedures:  No procedures performed Allergies: Patient has no known allergies.   Assessment / Plan:     Visit Diagnoses: Psoriatic arthritis ([place]): He has aggressive psoriatic arthritis and rheumatoid arthritis overlap.  He continues to have synovitis of bilateral wrist joints, several MCP, and PIP joints as described above.  He has intermittent joint pain and joint swelling.  He is on [place] sq injections once weekly, Imuran 100 mg po daily, and prednisone 10 mg daily.  He is unable to taper prednisone.  He will continue on this current treatment regimen. He does not need any refills.  He was advised to follow up with Dr. Elsworth Soho ASAP.  He will follow up in 5 months.  He will notify us if he has increased joint pain or joint swelling. We will obtain x-rays of both hands and feet at the next visit.     Psoriasis: He has no psoriasis at this time. Nail dystrophy noted.   High risk medication use - orencia sq(10/11/2016), imuran 100 mg po qd, prednisone  10 mg po qd ( Inadequate response to Enbrel, Humira, Cosyntex, Otezla ).  CBC and CMP were stable on 10/30/2017.  Patient is due for TB gold but would like to have it checked with his future labs in November.  A future order for TB gold was placed.  Rheumatoid arthritis involving multiple sites with positive rheumatoid factor (HCC) - +RF, +CCP: He continues to have active synovitis of bilateral wrist joints, MCPs, and PIP joints.  He is on Orencia subcutaneous injections once weekly and Imuran 100 mg by mouth daily.  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 Elsworth Soho ASAP.  Osteopenia of multiple sites - T score was -1.3 in October 2018. In the past Fosamax was discussed due to being on long-term prednisone.  Patient declined therapy.  Other medical conditions are listed as follows:  History of congestive heart failure  History of bronchiectasis   History of atrial fibrillation  History of coronary artery disease  History of hyperlipidemia  History of hypertension  History of gastroesophageal reflux (GERD)   Orders: No orders of the defined types were placed in this encounter.  No orders of the defined types were placed in this encounter.     Follow-Up Instructions: Return in about 5 months (around 05/03/2018) for Psoriatic arthritis, Rheumatoid arthritis.   Ofilia Neas, PA-C   I examined and evaluated the patient with Hazel Sams PA.  Patient continues to have active synovitis on examination.  Although his arthritis has improved since he has been on combination therapy with Orencia.  He still on prednisone 10 mg p.o. daily.  I doubt that he will be able to reduce his prednisone due to his joint disease and also pulmonary disease.  He will follow-up with his pulmonologist as well.  At this point he does not want to taper  prednisone.  The plan of care was discussed as noted above.  Bo Merino, MD Note - This record has been created using  Editor, commissioning.  Chart creation errors have been sought, but may not always  have been located. Such creation errors do not reflect on  the standard of medical care.

## 2017-11-29 ENCOUNTER — Other Ambulatory Visit: Payer: Self-pay | Admitting: Rheumatology

## 2017-12-01 NOTE — Telephone Encounter (Signed)
Last visit: 07/01/2017 Next visit: 12/02/2017  Okay to refill per Dr. Estanislado Pandy

## 2017-12-02 ENCOUNTER — Ambulatory Visit: Payer: Medicare Other | Admitting: Physician Assistant

## 2017-12-02 ENCOUNTER — Ambulatory Visit: Payer: Medicare Other | Admitting: Rheumatology

## 2017-12-02 ENCOUNTER — Other Ambulatory Visit: Payer: Self-pay | Admitting: Cardiovascular Disease

## 2017-12-02 ENCOUNTER — Ambulatory Visit (INDEPENDENT_AMBULATORY_CARE_PROVIDER_SITE_OTHER): Payer: Medicare Other | Admitting: Rheumatology

## 2017-12-02 ENCOUNTER — Other Ambulatory Visit: Payer: Self-pay | Admitting: Rheumatology

## 2017-12-02 ENCOUNTER — Encounter: Payer: Self-pay | Admitting: Physician Assistant

## 2017-12-02 ENCOUNTER — Ambulatory Visit (INDEPENDENT_AMBULATORY_CARE_PROVIDER_SITE_OTHER): Payer: Medicare Other | Admitting: Cardiovascular Disease

## 2017-12-02 ENCOUNTER — Encounter: Payer: Self-pay | Admitting: Cardiovascular Disease

## 2017-12-02 VITALS — BP 130/60 | HR 71 | Resp 15 | Ht 67.0 in | Wt 195.6 lb

## 2017-12-02 DIAGNOSIS — Z9861 Coronary angioplasty status: Secondary | ICD-10-CM

## 2017-12-02 DIAGNOSIS — I447 Left bundle-branch block, unspecified: Secondary | ICD-10-CM | POA: Diagnosis not present

## 2017-12-02 DIAGNOSIS — L4050 Arthropathic psoriasis, unspecified: Secondary | ICD-10-CM

## 2017-12-02 DIAGNOSIS — Z8639 Personal history of other endocrine, nutritional and metabolic disease: Secondary | ICD-10-CM

## 2017-12-02 DIAGNOSIS — I10 Essential (primary) hypertension: Secondary | ICD-10-CM | POA: Diagnosis not present

## 2017-12-02 DIAGNOSIS — Z79899 Other long term (current) drug therapy: Secondary | ICD-10-CM

## 2017-12-02 DIAGNOSIS — M0579 Rheumatoid arthritis with rheumatoid factor of multiple sites without organ or systems involvement: Secondary | ICD-10-CM | POA: Diagnosis not present

## 2017-12-02 DIAGNOSIS — I483 Typical atrial flutter: Secondary | ICD-10-CM

## 2017-12-02 DIAGNOSIS — I2510 Atherosclerotic heart disease of native coronary artery without angina pectoris: Secondary | ICD-10-CM | POA: Diagnosis not present

## 2017-12-02 DIAGNOSIS — L409 Psoriasis, unspecified: Secondary | ICD-10-CM

## 2017-12-02 DIAGNOSIS — E7800 Pure hypercholesterolemia, unspecified: Secondary | ICD-10-CM | POA: Diagnosis not present

## 2017-12-02 DIAGNOSIS — Z8679 Personal history of other diseases of the circulatory system: Secondary | ICD-10-CM

## 2017-12-02 DIAGNOSIS — M8589 Other specified disorders of bone density and structure, multiple sites: Secondary | ICD-10-CM

## 2017-12-02 DIAGNOSIS — Z8709 Personal history of other diseases of the respiratory system: Secondary | ICD-10-CM

## 2017-12-02 DIAGNOSIS — Z8719 Personal history of other diseases of the digestive system: Secondary | ICD-10-CM

## 2017-12-02 DIAGNOSIS — J8410 Pulmonary fibrosis, unspecified: Secondary | ICD-10-CM

## 2017-12-02 NOTE — Progress Notes (Signed)
12/02/2017 Curtis Flores   [birth year]  [id number]  Primary Physician Dione Housekeeper, MD Primary Cardiologist: Lorretta Harp MD Lupe Carney, Curtis  Flores:  Curtis Flores is a 62 y.o.  thin-appearing married African-American male father of 2, grandfather to 3 grandchildren accompanied by his wife Curtis Flores today. I last saw him in the office 05/09/15.His primary care physician is Dr. Edrick Oh in medicine. I recently saw him in the office 03/07/15. He has a remote history of tobacco abuse as well as a history of hypertension and hyperlipidemia rheumatoid arthritis. He had an inferolateral STEMI on 02/22/15. I took him to the Cath Lab and stented his proximal dominant circumflex and first obtuse marginal branch with drug-eluting stents (T stenting). He had residual 90% stenosis and a mid nondominant RCA 80% proximal LAD stenosis. His EF was 35-40% by 2-D echo. Subsequent Myoview stress testing performed soon thereafter was nonischemic.[name] There is a history of pulmonary fibrosis however as well. When I saw him in the office on 04/05/15 he was significantly dyspneic and tachycardic. I initially thought that this was sinus tachycardia however he was admitted to the hospital 2 days later with progressive dyspnea and he was diagnosed with atrial flutter. He did have a wide complex tachycardia prior to that which converted with venous adenosine. He UNDERWENT transesophageal echo/DC cardioversion successfully was has resulted in marked improvement in his symptoms. His EF at that time was 20%. He was put on oral anticoagulations and Brilenta was changed to Plavix. He also underwent a flutter ablation by Dr. Rayann Heman on 05/30/15 and was taken off his amiodarone.   Since I saw him a year ago he is remained stable.  Does complain of some dyspnea which is not changed in frequency or severity.  He denies chest pain.  Current Meds  Medication Sig  . Abatacept (ORENCIA CLICKJECT) 540 MG/ML SOAJ Inject 125 mg into the skin  once a week.  [name] atorvastatin (LIPITOR) 80 MG tablet Take 1 tablet (80 mg total) by mouth daily at 6 PM.  . azaTHIOprine (IMURAN) 50 MG tablet Take 1 tablet (50 mg total) by mouth 2 (two) times daily.  . carvedilol (COREG) 12.5 MG tablet Take 12.5 mg by mouth 2 (two) times daily with a meal.  . Clobetasol Prop Emollient Base (CLOBETASOL PROPIONATE E) 0.05 % emollient cream Apply to affected area BID  . clopidogrel (PLAVIX) 75 MG tablet Take 1 tablet (75 mg total) by mouth daily.  . furosemide (LASIX) 40 MG tablet TAKE 1 TABLET BY MOUTH TWICE A DAY  . losartan (COZAAR) 100 MG tablet TAKE 1 TABLET BY MOUTH EVERY DAY  . pantoprazole (PROTONIX) 40 MG tablet Take 1 tablet (40 mg total) by mouth 2 (two) times daily before a meal.  . predniSONE (DELTASONE) 5 MG tablet TAKE 2 TABLETS BY MOUTH EVERY DAY     No Known Allergies  Social History   Socioeconomic History  . Marital status: Married    Spouse name: Not on file  . Number of children: 2  . Years of education: Not on file  . Highest education level: Not on file  Occupational History  . Occupation: unemployed    Comment: not working do to arthritis; used to be Patent attorney for a [employer]  . Financial resource strain: Not on file  . Food insecurity:    Worry: Not on file    Inability: Not on file  . Transportation needs:  Medical: Not on file    Non-medical: Not on file  Tobacco Use  . Smoking status: Former Smoker    Packs/day: 1.00    Years: 30.00    Pack years: 30.00    Types: Cigarettes    Last attempt to quit: 03/21/2003    Years since quitting: 14.7  . Smokeless tobacco: Never Used  Substance and Sexual Activity  . Alcohol use: No    Alcohol/week: 0.0 standard drinks    Comment: H/O case of beer weekly x 20 years, quiting in 2000-ish.  . Drug use: No    Comment: H/O marijuana use many years ago.  [name] Sexual activity: Yes    Birth control/protection: None  Lifestyle  . Physical activity:     Days per week: Not on file    Minutes per session: Not on file  . Stress: Not on file  Relationships  . Social connections:    Talks on phone: Not on file    Gets together: Not on file    Attends religious service: Not on file    Active member of club or organization: Not on file    Attends meetings of clubs or organizations: Not on file    Relationship status: Not on file  . Intimate partner violence:    Fear of current or ex partner: Not on file    Emotionally abused: Not on file    Physically abused: Not on file    Forced sexual activity: Not on file  Other Topics Concern  . Not on file  Social History Narrative  . Not on file     Review of Systems: General: negative for chills, fever, night sweats or weight changes.  Cardiovascular: negative for chest pain, dyspnea on exertion, edema, orthopnea, palpitations, paroxysmal nocturnal dyspnea or shortness of breath Dermatological: negative for rash Respiratory: negative for cough or wheezing Urologic: negative for hematuria Abdominal: negative for nausea, vomiting, diarrhea, bright red blood per rectum, melena, or hematemesis Neurologic: negative for visual changes, syncope, or dizziness All other systems reviewed and are otherwise negative except as noted above.    Blood pressure (!) 156/70, pulse (!) 46, height 5\' 7"  (1.702 m), weight 196 lb (88.9 kg).  General appearance: alert and no distress Neck: no adenopathy, no carotid bruit, no JVD, supple, symmetrical, trachea midline and thyroid not enlarged, symmetric, no tenderness/mass/nodules Lungs: clear to auscultation bilaterally Heart: regular rate and rhythm, S1, S2 normal, no murmur, click, rub or gallop Extremities: extremities normal, atraumatic, no cyanosis or edema Pulses: 2+ and symmetric Skin: Skin color, texture, turgor normal. No rashes or lesions Neurologic: Alert and oriented X 3, normal strength and tone. Normal symmetric reflexes. Normal coordination and  gait  EKG sinus bradycardia at 46 with left bundle branch block.  I personally reviewed this EKG.  ASSESSMENT AND PLAN:   Hypertension History of essential hypertension her blood pressure measured at 156/70.  He is on carvedilol and losartan.  Hyperlipidemia History of hyperlipidemia on statin therapy with lipid profile performed 03/25/2017 revealed total cholesterol 98, LDL 48 and HDL of 28.  CAD S/P percutaneous coronary angioplasty History of CAD status post inferolateral STEMI 02/22/2015 at which time I performed a "T stenting" of his AV groove circumflex and marginal branch with residual disease in his nondominant RCA and LAD.  His EF was 35 to 40% by 2D echo at that time and subsequent Myoview stress test performed soon thereafter was nonischemic.  He does complain of some dyspnea and has pulmonary  fibrosis as well.  I am going to repeat a 2D echocardiogram for LV function.  LBBB (left bundle branch block) Chronic  Atrial flutter (HCC) History of atrial flutter status post ablation by Dr. Rayann Heman 05/30/2015.  He was taken off amiodarone at that time.  He is not on oral anticoagulation.      Lorretta Harp MD FACP,FACC,FAHA, [facility] 12/02/2017 9:51 AM

## 2017-12-02 NOTE — Patient Instructions (Signed)
Medication Instructions:  Your physician recommends that you continue on your current medications as directed. Please refer to the Current Medication list given to you today.  Labwork: none  Testing/Procedures: Your physician has requested that you have an echocardiogram. Echocardiography is a painless test that uses sound waves to create images of your heart. It provides your doctor with information about the size and shape of your heart and how well your heart's chambers and valves are working. This procedure takes approximately one hour. There are no restrictions for this procedure.    Follow-Up: Your physician wants you to follow-up in: 12 months with DR. [name]. You will receive a reminder letter in the mail two months in advance. If you don't receive a letter, please call our office to schedule the follow-up appointment.   Any Other Special Instructions Will Be Listed Below (If Applicable).     If you need a refill on your cardiac medications before your next appointment, please call your pharmacy.

## 2017-12-02 NOTE — Telephone Encounter (Signed)
Last visit: 07/01/2017 Next visit: 12/02/2017  Okay to refill per Dr. Estanislado Pandy

## 2017-12-02 NOTE — Assessment & Plan Note (Signed)
History of hyperlipidemia on statin therapy with lipid profile performed 03/25/2017 revealed total cholesterol 98, LDL 48 and HDL of 28.

## 2017-12-02 NOTE — Patient Instructions (Signed)
Standing Labs We placed an order today for your standing lab work.    Please come back and get your standing labs in November and every 3 months  CBC, CMP, and TB gold   We have open lab Monday through Friday from 8:30-11:30 AM and 1:30-4:00 PM  at the office of Dr. Bo Merino.   You may experience shorter wait times on Monday and Friday afternoons. The office is located at [address] [place], [place], [place] [address] No appointment is necessary.   Labs are drawn by [facility].  You may receive a bill from [facility] for your lab work. If you have any questions regarding directions or hours of operation,  please call [phone].   Just as a reminder please drink plenty of water prior to coming for your lab work. Thanks!

## 2017-12-02 NOTE — Assessment & Plan Note (Signed)
History of CAD status post inferolateral STEMI 02/22/2015 at which time I performed a "T stenting" of his AV groove circumflex and marginal branch with residual disease in his nondominant RCA and LAD.  His EF was 35 to 40% by 2D echo at that time and subsequent Myoview stress test performed soon thereafter was nonischemic.  He does complain of some dyspnea and has pulmonary fibrosis as well.  I am going to repeat a 2D echocardiogram for LV function.

## 2017-12-02 NOTE — Assessment & Plan Note (Signed)
History of atrial flutter status post ablation by Dr. Rayann Heman 05/30/2015.  He was taken off amiodarone at that time.  He is not on oral anticoagulation.

## 2017-12-02 NOTE — Assessment & Plan Note (Signed)
Chronic. 

## 2017-12-02 NOTE — Assessment & Plan Note (Signed)
History of essential hypertension her blood pressure measured at 156/70.  He is on carvedilol and losartan.

## 2017-12-11 ENCOUNTER — Encounter: Payer: Self-pay | Admitting: Internal Medicine

## 2017-12-16 ENCOUNTER — Telehealth: Payer: Self-pay | Admitting: Rheumatology

## 2017-12-16 ENCOUNTER — Ambulatory Visit (HOSPITAL_COMMUNITY): Payer: Medicare Other | Attending: Cardiovascular Disease

## 2017-12-16 ENCOUNTER — Other Ambulatory Visit: Payer: Self-pay

## 2017-12-16 DIAGNOSIS — I2510 Atherosclerotic heart disease of native coronary artery without angina pectoris: Secondary | ICD-10-CM | POA: Insufficient documentation

## 2017-12-16 DIAGNOSIS — Z9861 Coronary angioplasty status: Secondary | ICD-10-CM | POA: Diagnosis present

## 2017-12-16 DIAGNOSIS — E7800 Pure hypercholesterolemia, unspecified: Secondary | ICD-10-CM | POA: Diagnosis present

## 2017-12-16 DIAGNOSIS — I447 Left bundle-branch block, unspecified: Secondary | ICD-10-CM | POA: Insufficient documentation

## 2017-12-16 DIAGNOSIS — I10 Essential (primary) hypertension: Secondary | ICD-10-CM | POA: Diagnosis not present

## 2017-12-16 DIAGNOSIS — I483 Typical atrial flutter: Secondary | ICD-10-CM | POA: Diagnosis present

## 2017-12-16 NOTE — Telephone Encounter (Signed)
Patient's bone density is scheduled for 12/19/17 at 12 pm at [facility]. [place] [address]. Left message to advise patient on appointment date and time as well as location.

## 2017-12-16 NOTE — Telephone Encounter (Signed)
Patients wife thinks patient has a Bone Density scheduled for Friday the 18th, but does not know for sure or where. Please call to advise.

## 2017-12-19 ENCOUNTER — Encounter: Payer: Self-pay | Admitting: Rheumatology

## 2017-12-23 ENCOUNTER — Telehealth: Payer: Self-pay

## 2017-12-23 NOTE — Telephone Encounter (Signed)
Attempted to call patient and there was no answer nor voicemail available.

## 2017-12-23 NOTE — Telephone Encounter (Signed)
DEXA 12/19/2017. T-score: -1.6 BMD: 0.827 Left femoral neck.   Patient is on long term prednisone.  Continue on calcium, vitamin D and exercises.

## 2017-12-24 ENCOUNTER — Telehealth: Payer: Self-pay | Admitting: Rheumatology

## 2017-12-24 NOTE — Telephone Encounter (Signed)
See previous phone note.  

## 2017-12-24 NOTE — Telephone Encounter (Signed)
Patient's wife advised of [facility]' bone density scan and recommendations.

## 2017-12-24 NOTE — Telephone Encounter (Signed)
Patient's wife [name] left a voicemail stating she was returning your call.

## 2017-12-24 NOTE — Telephone Encounter (Signed)
Attempted to contact the patient and left message for patient to call the office.  

## 2017-12-29 ENCOUNTER — Other Ambulatory Visit: Payer: Self-pay | Admitting: Cardiovascular Disease

## 2017-12-29 NOTE — Telephone Encounter (Signed)
Rx request sent to pharmacy.  

## 2018-01-05 ENCOUNTER — Telehealth: Payer: Self-pay | Admitting: Pharmacy Technician

## 2018-01-05 NOTE — Telephone Encounter (Signed)
Spoke to wife and let her know that a renewal application is on the way for 2020. She will start gathering required documents and will complete application and will mail it to office to be submitted. She will call if she has any questions.  11:11 AM Beatriz Chancellor, CPhT

## 2018-01-19 ENCOUNTER — Other Ambulatory Visit: Payer: Self-pay | Admitting: Cardiovascular Disease

## 2018-01-21 ENCOUNTER — Telehealth: Payer: Self-pay | Admitting: Pharmacist

## 2018-01-21 ENCOUNTER — Other Ambulatory Visit: Payer: Self-pay

## 2018-01-21 DIAGNOSIS — Z79899 Other long term (current) drug therapy: Secondary | ICD-10-CM

## 2018-01-21 NOTE — Telephone Encounter (Signed)
Patient coming to office to fill out paperwork for 2020 Renewal of Curtis Flores Patient Assistance.  He is also due for labs.  Orders for CBC/CMP and TB gold placed.

## 2018-01-21 NOTE — Telephone Encounter (Signed)
Patient came by to complete application, will fax in once MD signature has been received.  11:31 AM Beatriz Chancellor, CPhT

## 2018-01-22 NOTE — Progress Notes (Signed)
Labs are stable.  Elevated WBC count noted.  Patient is on prednisone.

## 2018-01-23 LAB — CBC WITH DIFFERENTIAL/PLATELET
BASOS PCT: 0.6 %
Basophils Absolute: 116 cells/uL (ref 0–200)
Eosinophils Absolute: 78 cells/uL (ref 15–500)
Eosinophils Relative: 0.4 %
HEMATOCRIT: 43.5 % (ref 38.5–50.0)
Hemoglobin: 14.4 g/dL (ref 13.2–17.1)
LYMPHS ABS: 2794 {cells}/uL (ref 850–3900)
MCH: 27.7 pg (ref 27.0–33.0)
MCHC: 33.1 g/dL (ref 32.0–36.0)
MCV: 83.7 fL (ref 80.0–100.0)
MPV: 12.5 fL (ref 7.5–12.5)
Monocytes Relative: 5.7 %
NEUTROS ABS: 15307 {cells}/uL — AB (ref 1500–7800)
Neutrophils Relative %: 78.9 %
PLATELETS: 241 10*3/uL (ref 140–400)
RBC: 5.2 10*6/uL (ref 4.20–5.80)
RDW: 13.3 % (ref 11.0–15.0)
Total Lymphocyte: 14.4 %
WBC: 19.4 10*3/uL — AB (ref 3.8–10.8)
WBCMIX: 1106 {cells}/uL — AB (ref 200–950)

## 2018-01-23 LAB — COMPLETE METABOLIC PANEL WITH GFR
AG Ratio: 1.3 (calc) (ref 1.0–2.5)
ALKALINE PHOSPHATASE (APISO): 62 U/L (ref 40–115)
ALT: 16 U/L (ref 9–46)
AST: 19 U/L (ref 10–35)
Albumin: 3.5 g/dL — ABNORMAL LOW (ref 3.6–5.1)
BUN: 9 mg/dL (ref 7–25)
CO2: 31 mmol/L (ref 20–32)
CREATININE: 0.73 mg/dL (ref 0.70–1.25)
Calcium: 9.5 mg/dL (ref 8.6–10.3)
Chloride: 102 mmol/L (ref 98–110)
GFR, Est African American: 114 mL/min/{1.73_m2} (ref >=60)
GFR, Est Non African American: 98 mL/min/{1.73_m2} (ref >=60)
GLUCOSE: 114 mg/dL — AB (ref 65–99)
Globulin: 2.8 g/dL (calc) (ref 1.9–3.7)
Potassium: 3.9 mmol/L (ref 3.5–5.3)
Sodium: 141 mmol/L (ref 135–146)
Total Bilirubin: 0.4 mg/dL (ref 0.2–1.2)
Total Protein: 6.3 g/dL (ref 6.1–8.1)

## 2018-01-23 LAB — QUANTIFERON-TB GOLD PLUS
MITOGEN-NIL: 9.55 [IU]/mL
NIL: 0.04 [IU]/mL
QUANTIFERON-TB GOLD PLUS: NEGATIVE
TB1-NIL: <0 IU/mL
TB2-NIL: <0 IU/mL

## 2018-01-27 ENCOUNTER — Other Ambulatory Visit: Payer: Self-pay | Admitting: Pulmonary Disease

## 2018-01-27 ENCOUNTER — Ambulatory Visit: Payer: Medicare Other | Admitting: Pulmonary Disease

## 2018-01-27 DIAGNOSIS — J8410 Pulmonary fibrosis, unspecified: Secondary | ICD-10-CM

## 2018-01-28 ENCOUNTER — Other Ambulatory Visit: Payer: Self-pay | Admitting: Rheumatology

## 2018-01-28 ENCOUNTER — Other Ambulatory Visit: Payer: Self-pay | Admitting: Cardiovascular Disease

## 2018-01-28 ENCOUNTER — Inpatient Hospital Stay (HOSPITAL_COMMUNITY): Payer: Medicare Other

## 2018-01-28 DIAGNOSIS — J189 Pneumonia, unspecified organism: Secondary | ICD-10-CM | POA: Diagnosis not present

## 2018-01-28 DIAGNOSIS — D508 Other iron deficiency anemias: Secondary | ICD-10-CM

## 2018-01-28 DIAGNOSIS — D72823 Leukemoid reaction: Secondary | ICD-10-CM

## 2018-01-28 DIAGNOSIS — A419 Sepsis, unspecified organism: Secondary | ICD-10-CM | POA: Diagnosis not present

## 2018-01-28 LAB — LACTATE DEHYDROGENASE: LDH: 179 U/L (ref 98–192)

## 2018-01-28 LAB — CBC WITH DIFFERENTIAL/PLATELET
Abs Immature Granulocytes: 0.12 10*3/uL — ABNORMAL HIGH (ref 0.00–0.07)
BASOS ABS: 0.1 10*3/uL (ref 0.0–0.1)
Basophils Relative: 0 %
EOS ABS: 0.1 10*3/uL (ref 0.0–0.5)
EOS PCT: 1 %
HEMATOCRIT: 44.3 % (ref 39.0–52.0)
Hemoglobin: 14.1 g/dL (ref 13.0–17.0)
Immature Granulocytes: 1 %
LYMPHS ABS: 2.4 10*3/uL (ref 0.7–4.0)
Lymphocytes Relative: 11 %
MCH: 28.1 pg (ref 26.0–34.0)
MCHC: 31.8 g/dL (ref 30.0–36.0)
MCV: 88.4 fL (ref 80.0–100.0)
MONO ABS: 1.2 10*3/uL — AB (ref 0.1–1.0)
Monocytes Relative: 6 %
Neutro Abs: 16.8 10*3/uL — ABNORMAL HIGH (ref 1.7–7.7)
Neutrophils Relative %: 81 %
Platelets: 252 10*3/uL (ref 150–400)
RBC: 5.01 MIL/uL (ref 4.22–5.81)
RDW: 14.3 % (ref 11.5–15.5)
WBC: 20.6 10*3/uL — ABNORMAL HIGH (ref 4.0–10.5)
nRBC: 0 % (ref 0.0–0.2)

## 2018-01-28 LAB — COMPREHENSIVE METABOLIC PANEL
ALT: 25 U/L (ref 0–44)
ANION GAP: 6 (ref 5–15)
AST: 23 U/L (ref 15–41)
Albumin: 3.4 g/dL — ABNORMAL LOW (ref 3.5–5.0)
Alkaline Phosphatase: 57 U/L (ref 38–126)
BUN: 13 mg/dL (ref 8–23)
CALCIUM: 9.1 mg/dL (ref 8.9–10.3)
CO2: 29 mmol/L (ref 22–32)
Chloride: 106 mmol/L (ref 98–111)
Creatinine, Ser: 0.84 mg/dL (ref 0.61–1.24)
Glucose, Bld: 112 mg/dL — ABNORMAL HIGH (ref 70–99)
Potassium: 4.1 mmol/L (ref 3.5–5.1)
Sodium: 141 mmol/L (ref 135–145)
Total Bilirubin: 0.6 mg/dL (ref 0.3–1.2)
Total Protein: 6.9 g/dL (ref 6.5–8.1)

## 2018-01-28 LAB — SEDIMENTATION RATE: SED RATE: 33 mm/h — AB (ref 0–16)

## 2018-01-28 LAB — FERRITIN: FERRITIN: 188 ng/mL (ref 24–336)

## 2018-01-28 NOTE — Telephone Encounter (Signed)
Last visit: 07/01/2017 Next visit: 12/02/2017 Labs: 01/21/18 Labs are stable.  Elevated WBC count noted.  Patient is on prednisone.  Okay to refill per Dr. Estanislado Pandy

## 2018-01-30 ENCOUNTER — Emergency Department (HOSPITAL_COMMUNITY): Payer: Medicare Other

## 2018-01-30 ENCOUNTER — Other Ambulatory Visit: Payer: Self-pay

## 2018-01-30 ENCOUNTER — Inpatient Hospital Stay (HOSPITAL_COMMUNITY)
Admission: EM | Admit: 2018-01-30 | Discharge: 2018-02-01 | DRG: 871 | Disposition: A | Discharge status: Home / Self Care | Payer: Medicare Other | Attending: Family Medicine | Admitting: Family Medicine

## 2018-01-30 ENCOUNTER — Encounter (HOSPITAL_COMMUNITY): Payer: Self-pay | Admitting: Emergency Medicine

## 2018-01-30 DIAGNOSIS — J8410 Pulmonary fibrosis, unspecified: Secondary | ICD-10-CM | POA: Diagnosis present

## 2018-01-30 DIAGNOSIS — I4892 Unspecified atrial flutter: Secondary | ICD-10-CM | POA: Diagnosis present

## 2018-01-30 DIAGNOSIS — Z87891 Personal history of nicotine dependence: Secondary | ICD-10-CM

## 2018-01-30 DIAGNOSIS — I5022 Chronic systolic (congestive) heart failure: Secondary | ICD-10-CM | POA: Diagnosis present

## 2018-01-30 DIAGNOSIS — I248 Other forms of acute ischemic heart disease: Secondary | ICD-10-CM | POA: Diagnosis present

## 2018-01-30 DIAGNOSIS — I447 Left bundle-branch block, unspecified: Secondary | ICD-10-CM | POA: Diagnosis present

## 2018-01-30 DIAGNOSIS — A419 Sepsis, unspecified organism: Principal | ICD-10-CM | POA: Diagnosis present

## 2018-01-30 DIAGNOSIS — K219 Gastro-esophageal reflux disease without esophagitis: Secondary | ICD-10-CM | POA: Diagnosis present

## 2018-01-30 DIAGNOSIS — J181 Lobar pneumonia, unspecified organism: Secondary | ICD-10-CM | POA: Diagnosis present

## 2018-01-30 DIAGNOSIS — I10 Essential (primary) hypertension: Secondary | ICD-10-CM | POA: Diagnosis present

## 2018-01-30 DIAGNOSIS — I110 Hypertensive heart disease with heart failure: Secondary | ICD-10-CM | POA: Diagnosis present

## 2018-01-30 DIAGNOSIS — I4891 Unspecified atrial fibrillation: Secondary | ICD-10-CM | POA: Diagnosis present

## 2018-01-30 DIAGNOSIS — E785 Hyperlipidemia, unspecified: Secondary | ICD-10-CM | POA: Diagnosis present

## 2018-01-30 DIAGNOSIS — E782 Mixed hyperlipidemia: Secondary | ICD-10-CM | POA: Diagnosis present

## 2018-01-30 DIAGNOSIS — Z79899 Other long term (current) drug therapy: Secondary | ICD-10-CM

## 2018-01-30 DIAGNOSIS — I2510 Atherosclerotic heart disease of native coronary artery without angina pectoris: Secondary | ICD-10-CM | POA: Diagnosis present

## 2018-01-30 DIAGNOSIS — I252 Old myocardial infarction: Secondary | ICD-10-CM

## 2018-01-30 DIAGNOSIS — M069 Rheumatoid arthritis, unspecified: Secondary | ICD-10-CM | POA: Diagnosis present

## 2018-01-30 DIAGNOSIS — J189 Pneumonia, unspecified organism: Secondary | ICD-10-CM

## 2018-01-30 DIAGNOSIS — I255 Ischemic cardiomyopathy: Secondary | ICD-10-CM | POA: Diagnosis present

## 2018-01-30 DIAGNOSIS — Z7902 Long term (current) use of antithrombotics/antiplatelets: Secondary | ICD-10-CM

## 2018-01-30 DIAGNOSIS — Z7952 Long term (current) use of systemic steroids: Secondary | ICD-10-CM

## 2018-01-30 DIAGNOSIS — J9601 Acute respiratory failure with hypoxia: Secondary | ICD-10-CM | POA: Diagnosis present

## 2018-01-30 LAB — I-STAT CG4 LACTIC ACID, ED: Lactic Acid, Venous: 1.62 mmol/L (ref 0.5–1.9)

## 2018-01-30 MED ORDER — SODIUM CHLORIDE 0.9 % IV SOLN
2.0000 g | Freq: Once | INTRAVENOUS | Status: AC
  Administered 2018-01-31: 2 g via INTRAVENOUS
  Filled 2018-01-30: qty 2

## 2018-01-30 MED ORDER — ACETAMINOPHEN 500 MG PO TABS
1000.0000 mg | ORAL_TABLET | Freq: Once | ORAL | Status: AC
  Administered 2018-01-31: 1000 mg via ORAL
  Filled 2018-01-30: qty 2

## 2018-01-30 MED ORDER — VANCOMYCIN HCL IN DEXTROSE 1-5 GM/200ML-% IV SOLN
1000.0000 mg | Freq: Once | INTRAVENOUS | Status: AC
  Administered 2018-01-31: 1000 mg via INTRAVENOUS
  Filled 2018-01-30: qty 200

## 2018-01-30 NOTE — ED Provider Notes (Addendum)
[facility] EMERGENCY DEPARTMENT Provider Note   CSN: [id number] Arrival date & time: 01/30/18  2322     History   Chief Complaint Chief Complaint  Patient presents with  . Shortness of Breath    HPI Curtis Flores is a 62 y.o. male.  HPI   62 yo M with PMHx as below including HFrEF, pulmonary fibrosis, RA on Prednisone and Orencia here w/ cough, fever. Pt reports 2-3 days of fever, cough, and SOB. Sx started gradually and have progressively worsened. He first noticed feeling very "sluggish" then developed worsening non-productive cough. He's also felt very short of breath, initially w/ exertion but now at rest. He's had fever and chills as well as poor appetite. Denies known sick contacts. Denies any myalgias or arthralgias. He did have some transient, pleuritic CP this morning that is resolved. No diaphoresis. No n/v/d. No abdominal pain. No rash.  Past Medical History:  Diagnosis Date  . Abnormal CT scan, stomach   . Chronic systolic dysfunction of left ventricle   . Coronary artery disease    lateral STEMI 02/22/15  . GERD (gastroesophageal reflux disease)   . Hyperlipidemia   . Hypertension   . Ischemic cardiomyopathy   . LBBB (left bundle branch block)   . Leukocytosis    followed by hematology, reactive  . Lymphadenopathy   . Myocardial infarction ([place]) 02/2015  . Psoriasis 2003  . Psoriatic arthritis ([place])   . Pulmonary fibrosis ([place])   . Rheumatoid arthritis(714.0) 2012  . Typical atrial flutter [facility])     Patient Active Problem List   Diagnosis Date Noted  . Ischemic cardiomyopathy 12/04/2016  . Leukocytosis 07/17/2016  . Pain in joint involving multiple sites 04/25/2016  . Psoriatic arthritis ([place]) 02/14/2016  . High risk medication use 02/14/2016  . NICM (nonischemic cardiomyopathy) ([place]) 08/29/2015  . Essential hypertension 08/29/2015  . Abnormal CT scan, stomach   . Hiatal hernia   . GERD (gastroesophageal reflux disease)  08/01/2015  . Weight loss, unintentional 08/01/2015  . Iron deficiency anemia 06/06/2015  . Heme positive stool 06/06/2015  . PAF (paroxysmal atrial fibrillation) ([place]) 05/30/2015  . CAD S/P percutaneous coronary angioplasty 04/07/2015  . Atrial flutter ([place]) 04/07/2015  . 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 Acute on chronic systolic congestive heart failure ([place]) 04/07/2015  . LBBB (left bundle branch block) 04/07/2015  . Wide-complex tachycardia ([place]) 04/07/2015  . Hyperlipidemia 03/07/2015  . Hypertension   . Psoriasis   . Rheumatoid arthritis ([place])   . Pulmonary fibrosis ([place]) 03/21/2011    Past Surgical History:  Procedure Laterality Date  . CARDIAC CATHETERIZATION N/A 02/22/2015   Procedure: Left Heart Cath and Coronary Angiography;  Surgeon: Lorretta Harp, MD;  Location: [facility] CV LAB;  Service: Cardiovascular;  Laterality: N/A;  . CARDIAC CATHETERIZATION N/A 02/22/2015   Procedure: Coronary Stent Intervention;  Surgeon: Lorretta Harp, MD;  Location: [place] [facility] CV LAB;  Service: Cardiovascular;  Laterality: N/A;  . CARDIOVERSION N/A 04/12/2015   Procedure: CARDIOVERSION;  Surgeon: Larey Dresser, MD;  Location: [facility] ENDOSCOPY;  Service: Cardiovascular;  Laterality: N/A;  . COLONOSCOPY  07/01/2003   XLK:GMWNUU colonic mucosa except for the proximal right colon in the area of ICV which was not seen completely due to inadequate bowel prep. followed with ACBE which was normal.   . COLONOSCOPY N/A 08/24/2015   Dr. Gala Romney: Normal colon. Next colonoscopy in 10 years.  [name] ELECTROPHYSIOLOGIC STUDY N/A 05/30/2015   Atrial fibrillation  ablation by Dr Rayann Heman  . ESOPHAGOGASTRODUODENOSCOPY N/A 08/24/2015   Dr. Gala Romney: Medium-sized hiatal hernia, erosive gastropathy. Kaylynn Chamblin lesions. Esophageal mucosa distally suggestive of short segment Barrett's esophagus. Not confirmed on biopsy. Gastric biopsy with minimal chronic inflammation  . GIVENS CAPSULE STUDY N/A 04/17/2016    Procedure: GIVENS CAPSULE STUDY;  Surgeon: Daneil Dolin, MD;  Location: [facility] ENDO SUITE;  Service: Endoscopy;  Laterality: N/A;  Pt to arrive at 8:00 am for 8:30 am appt  . TEE WITHOUT CARDIOVERSION N/A 04/12/2015   Procedure: TRANSESOPHAGEAL ECHOCARDIOGRAM (TEE);  Surgeon: Larey Dresser, MD;  Location: [facility];  Service: Cardiovascular;  Laterality: N/A;        Home Medications    Prior to Admission medications   Medication Sig Start Date End Date Taking? Authorizing Provider  Abatacept (ORENCIA CLICKJECT) 751 MG/ML SOAJ Inject 125 mg into the skin once a week. 10/14/17  Yes Deveshwar, Abel Presto, MD  atorvastatin (LIPITOR) 80 MG tablet TAKE 1 TABLET (80 MG TOTAL) BY MOUTH DAILY AT 6 PM. 12/29/17  Yes Lorretta Harp, MD  azaTHIOprine (IMURAN) 50 MG tablet TAKE 1 TABLET BY MOUTH TWICE A DAY Patient taking differently: Take 50 mg by mouth 2 (two) times daily.  01/28/18  Yes Deveshwar, Abel Presto, MD  carvedilol (COREG) 12.5 MG tablet TAKE 1 AND 1/2 TABLETS BY MOUTH TWICE DAILY Patient taking differently: Take 18.75 mg by mouth 2 (two) times daily with a meal.  01/19/18  Yes Lorretta Harp, MD  Clobetasol Prop Emollient Base (CLOBETASOL PROPIONATE E) 0.05 % emollient cream Apply to affected area BID Patient taking differently: Apply topically 2 (two) times daily.  03/27/17  Yes Deveshwar, Abel Presto, MD  clopidogrel (PLAVIX) 75 MG tablet Take 1 tablet (75 mg total) by mouth daily. 11/29/16  Yes Lorretta Harp, MD  furosemide (LASIX) 40 MG tablet TAKE 1 TABLET BY MOUTH TWICE A DAY Patient taking differently: Take 40 mg by mouth 2 (two) times daily.  12/02/17  Yes Lorretta Harp, MD  losartan (COZAAR) 100 MG tablet TAKE 1 TABLET BY MOUTH EVERY DAY Patient taking differently: Take 100 mg by mouth daily.  07/29/17  Yes Lorretta Harp, MD  pantoprazole (PROTONIX) 40 MG tablet Take 1 tablet (40 mg total) by mouth 2 (two) times daily before a meal. 02/12/17  Yes Annitta Needs, NP  predniSONE  (DELTASONE) 5 MG tablet TAKE 2 TABLETS BY MOUTH EVERY DAY Patient taking differently: Take 10 mg by mouth daily with breakfast.  12/02/17  Yes Deveshwar, Abel Presto, MD  clopidogrel (PLAVIX) 75 MG tablet TAKE 1 TABLET BY MOUTH EVERY DAY Patient not taking: Reported on 01/31/2018 01/28/18   Lorretta Harp, MD  predniSONE (DELTASONE) 5 MG tablet TAKE 2 TABLETS BY MOUTH EVERY DAY Patient not taking: Reported on 01/31/2018 12/01/17   Bo Merino, MD    Family History Family History  Problem Relation Age of Onset  . Hypertension Mother   . Colon cancer Neg Hx     Social History Social History   Tobacco Use  . Smoking status: Former Smoker    Packs/day: 1.00    Years: 30.00    Pack years: 30.00    Types: Cigarettes    Last attempt to quit: 03/21/2003    Years since quitting: 14.8  . Smokeless tobacco: Never Used  Substance Use Topics  . Alcohol use: No    Alcohol/week: 0.0 standard drinks    Comment: H/O case of beer weekly x 20 years, quiting in 2000-ish.  [name]  Drug use: No    Comment: H/O marijuana use many years ago.     Allergies   Patient has no known allergies.   Review of Systems Review of Systems  Constitutional: Positive for chills, fatigue and fever.  HENT: Negative for congestion and rhinorrhea.   Eyes: Negative for visual disturbance.  Respiratory: Positive for cough and shortness of breath. Negative for wheezing.   Cardiovascular: Negative for chest pain and leg swelling.  Gastrointestinal: Negative for abdominal pain, diarrhea, nausea and vomiting.  Genitourinary: Negative for dysuria and flank pain.  Musculoskeletal: Negative for neck pain and neck stiffness.  Skin: Negative for rash and wound.  Allergic/Immunologic: Negative for immunocompromised state.  Neurological: Negative for syncope, weakness and headaches.  All other systems reviewed and are negative.    Physical Exam Updated Vital Signs BP (!) 114/46   Pulse (!) 46   Temp (!) 102.6 F  (39.2 C) (Oral)   Resp 18   Ht 5\' 7"  (1.702 m)   Wt 88.5 kg   SpO2 92%   BMI 30.54 kg/m   Physical Exam  Constitutional: He is oriented to person, place, and time. He appears well-developed and well-nourished. He does not appear ill. No distress.  Non-toxic  HENT:  Head: Normocephalic and atraumatic.  Eyes: Conjunctivae are normal.  Neck: Neck supple.  Cardiovascular: Normal rate, regular rhythm and normal heart sounds. Exam reveals no friction rub.  No murmur heard. Pulmonary/Chest: Effort normal. No respiratory distress. He has decreased breath sounds. He has no wheezes. He has rhonchi. He has rales in the right lower field and the left lower field.  Abdominal: He exhibits no distension.  Musculoskeletal: He exhibits no edema.  Neurological: He is alert and oriented to person, place, and time. He exhibits normal muscle tone.  Skin: Skin is warm. Capillary refill takes less than 2 seconds.  Psychiatric: He has a normal mood and affect.  Nursing note and vitals reviewed.    ED Treatments / Results  Labs (all labs ordered are listed, but only abnormal results are displayed) Labs Reviewed  COMPREHENSIVE METABOLIC PANEL - Abnormal; Notable for the following components:      Result Value   Chloride 97 (*)    Albumin 2.9 (*)    Total Bilirubin 1.5 (*)    All other components within normal limits  CBC WITH DIFFERENTIAL/PLATELET - Abnormal; Notable for the following components:   WBC 26.7 (*)    Neutro Abs 21.3 (*)    Monocytes Absolute 1.9 (*)    Abs Immature Granulocytes 0.40 (*)    All other components within normal limits  BRAIN NATRIURETIC PEPTIDE - Abnormal; Notable for the following components:   B Natriuretic Peptide 461.3 (*)    All other components within normal limits  TROPONIN I - Abnormal; Notable for the following components:   Troponin I 0.13 (*)    All other components within normal limits  CULTURE, BLOOD (ROUTINE X 2)  CULTURE, BLOOD (ROUTINE X 2)    URINALYSIS, ROUTINE W REFLEX MICROSCOPIC  I-STAT CG4 LACTIC ACID, ED  I-STAT CG4 LACTIC ACID, ED    EKG EKG Interpretation  Date/Time:  Friday January 30 2018 23:31:43 EST Ventricular Rate:  86 PR Interval:    QRS Duration: 147 QT Interval:  379 QTC Calculation: 454 R Axis:   -48 Text Interpretation:  Sinus rhythm Probable left atrial enlargement Left bundle branch block No significant change since last tracing Confirmed by Duffy Bruce [phone]) on 01/30/2018 11:57:10 PM  Radiology Dg Chest 2 View  Result Date: 01/31/2018 CLINICAL DATA:  Shortness of breath. Cough. Weakness. EXAM: CHEST - 2 VIEW COMPARISON:  Radiograph 08/20/2016 chest CT 05/12/2015 FINDINGS: Borderline cardiomegaly with aortic atherosclerosis. Patient with known interstitial lung disease with peripheral reticulation in a basilar predominant pattern. Increased right perihilar opacities with slight increased density at the bases since prior radiograph. No large pleural effusion or pneumothorax. Unchanged osseous structures. IMPRESSION: Known interstitial lung disease. Increased right suprahilar opacity and increased bibasilar density in the region of chronic lung disease may represent progression of chronic disease versus superimposed pneumonia or atelectasis. Electronically Signed   By: Keith Rake M.D.   On: 01/31/2018 00:43    Procedures .Critical Care Performed by: Duffy Bruce, MD Authorized by: Duffy Bruce, MD   Critical care provider statement:    Critical care time (minutes):  35   Critical care time was exclusive of:  Separately billable procedures and treating other patients and teaching time   Critical care was necessary to treat or prevent imminent or life-threatening deterioration of the following conditions:  Respiratory failure, circulatory failure, sepsis and cardiac failure   Critical care was time spent personally by me on the following activities:  Development of treatment plan  with patient or surrogate, discussions with consultants, evaluation of patient's response to treatment, examination of patient, obtaining history from patient or surrogate, ordering and performing treatments and interventions, ordering and review of laboratory studies, ordering and review of radiographic studies, pulse oximetry, re-evaluation of patient's condition and review of old charts   I assumed direction of critical care for this patient from another provider in my specialty: no     (including critical care time)  Medications Ordered in ED Medications  vancomycin (VANCOCIN) IVPB 1000 mg/200 mL premix (has no administration in time range)  ceFEPIme (MAXIPIME) 1 g in sodium chloride 0.9 % 100 mL IVPB (has no administration in time range)  sodium chloride 0.9 % bolus 500 mL (has no administration in time range)  hydrocortisone sodium succinate (SOLU-CORTEF) 100 MG injection 100 mg (has no administration in time range)  vancomycin (VANCOCIN) IVPB 1000 mg/200 mL premix (0 mg Intravenous Stopped 01/31/18 0202)  ceFEPIme (MAXIPIME) 2 g in sodium chloride 0.9 % 100 mL IVPB (0 g Intravenous Stopped 01/31/18 0050)  acetaminophen (TYLENOL) tablet 1,000 mg (1,000 mg Oral Given 01/31/18 0011)  sodium chloride 0.9 % bolus 500 mL (0 mLs Intravenous Stopped 01/31/18 0050)     Initial Impression / Assessment and Plan / ED Course  I have reviewed the triage vital signs and the nursing notes.  Pertinent labs & imaging results that were available during my care of the patient were reviewed by me and considered in my medical decision making (see chart for details).     62 yo M with h/o RA on Prednisone/Orencia, pulm fibrosis here w/ SOB, fever. Pt febrile on arrival, borderline hypoxic. Imaging, labs are concerning for sepsis 2/2 PNA. LA normal, no signs of shock. Pt given cautious fluids (30 cc/kg contraindicated in setting of normal LA with HFrEF), IV ABX, as well as a stress dose of hydrocortisone.  Admit to medicine. Mentating well.  Final Clinical Impressions(s) / ED Diagnoses   Final diagnoses:  Sepsis due to pneumonia [employer])    ED Discharge Orders    None       Duffy Bruce, MD 01/31/18 4580    Duffy Bruce, MD 02/09/18 1218

## 2018-01-30 NOTE — Progress Notes (Signed)
Pharmacy Antibiotic Note  KORBIN MAPPS is a 62 y.o. male admitted on 01/30/2018 with sepsis.  Pharmacy has been consulted for vancomycin and cefepime dosing.Cefepime 2gm and vancomycin 1gm ordered in the ED  Plan: Continue vancomycin 1gm IV q12 hours Continue cefepime 1gm IV q8 hours F/u renal function, cultures and clinical course  Height: 5\' 7"  (170.2 cm) Weight: 195 lb (88.5 kg) IBW/kg (Calculated) : 66.1  Temp (24hrs), Avg:102.6 F (39.2 C), Min:102.6 F (39.2 C), Max:102.6 F (39.2 C)  Recent Labs  Lab 01/28/18 1249 01/30/18 2345  WBC 20.6*  --   CREATININE 0.84  --   LATICACIDVEN  --  1.62    Estimated Creatinine Clearance: 94.4 mL/min (by C-G formula based on SCr of 0.84 mg/dL).    No Known Allergies    Thank you for allowing pharmacy to be a part of this patient's care.  Excell Seltzer Poteet 01/30/2018 11:53 PM

## 2018-01-30 NOTE — ED Triage Notes (Signed)
Pt reports dizziness, SOB, CP, cough, fever/chills X2-3 days. Pt noted to have fever 102.6, O2 sats low 90's.

## 2018-01-31 ENCOUNTER — Other Ambulatory Visit: Payer: Self-pay

## 2018-01-31 ENCOUNTER — Emergency Department (HOSPITAL_COMMUNITY): Payer: Medicare Other

## 2018-01-31 DIAGNOSIS — I447 Left bundle-branch block, unspecified: Secondary | ICD-10-CM

## 2018-01-31 DIAGNOSIS — I2510 Atherosclerotic heart disease of native coronary artery without angina pectoris: Secondary | ICD-10-CM | POA: Diagnosis present

## 2018-01-31 DIAGNOSIS — A419 Sepsis, unspecified organism: Secondary | ICD-10-CM | POA: Diagnosis present

## 2018-01-31 DIAGNOSIS — I10 Essential (primary) hypertension: Secondary | ICD-10-CM | POA: Diagnosis not present

## 2018-01-31 DIAGNOSIS — J9601 Acute respiratory failure with hypoxia: Secondary | ICD-10-CM | POA: Diagnosis present

## 2018-01-31 DIAGNOSIS — I5022 Chronic systolic (congestive) heart failure: Secondary | ICD-10-CM | POA: Diagnosis present

## 2018-01-31 DIAGNOSIS — K219 Gastro-esophageal reflux disease without esophagitis: Secondary | ICD-10-CM | POA: Diagnosis present

## 2018-01-31 DIAGNOSIS — J8410 Pulmonary fibrosis, unspecified: Secondary | ICD-10-CM

## 2018-01-31 DIAGNOSIS — I248 Other forms of acute ischemic heart disease: Secondary | ICD-10-CM | POA: Diagnosis present

## 2018-01-31 DIAGNOSIS — J189 Pneumonia, unspecified organism: Secondary | ICD-10-CM | POA: Diagnosis present

## 2018-01-31 DIAGNOSIS — R7989 Other specified abnormal findings of blood chemistry: Secondary | ICD-10-CM

## 2018-01-31 DIAGNOSIS — I4892 Unspecified atrial flutter: Secondary | ICD-10-CM | POA: Diagnosis present

## 2018-01-31 DIAGNOSIS — M0579 Rheumatoid arthritis with rheumatoid factor of multiple sites without organ or systems involvement: Secondary | ICD-10-CM

## 2018-01-31 DIAGNOSIS — J181 Lobar pneumonia, unspecified organism: Secondary | ICD-10-CM

## 2018-01-31 DIAGNOSIS — Z79899 Other long term (current) drug therapy: Secondary | ICD-10-CM | POA: Diagnosis not present

## 2018-01-31 DIAGNOSIS — Z7952 Long term (current) use of systemic steroids: Secondary | ICD-10-CM | POA: Diagnosis not present

## 2018-01-31 DIAGNOSIS — Z7902 Long term (current) use of antithrombotics/antiplatelets: Secondary | ICD-10-CM | POA: Diagnosis not present

## 2018-01-31 DIAGNOSIS — E785 Hyperlipidemia, unspecified: Secondary | ICD-10-CM

## 2018-01-31 DIAGNOSIS — I252 Old myocardial infarction: Secondary | ICD-10-CM | POA: Diagnosis not present

## 2018-01-31 DIAGNOSIS — I110 Hypertensive heart disease with heart failure: Secondary | ICD-10-CM | POA: Diagnosis present

## 2018-01-31 DIAGNOSIS — M069 Rheumatoid arthritis, unspecified: Secondary | ICD-10-CM | POA: Diagnosis present

## 2018-01-31 DIAGNOSIS — Z87891 Personal history of nicotine dependence: Secondary | ICD-10-CM | POA: Diagnosis not present

## 2018-01-31 DIAGNOSIS — I483 Typical atrial flutter: Secondary | ICD-10-CM | POA: Diagnosis not present

## 2018-01-31 DIAGNOSIS — I255 Ischemic cardiomyopathy: Secondary | ICD-10-CM | POA: Diagnosis present

## 2018-01-31 DIAGNOSIS — I4891 Unspecified atrial fibrillation: Secondary | ICD-10-CM | POA: Diagnosis present

## 2018-01-31 LAB — LACTIC ACID, PLASMA: Lactic Acid, Venous: 1.3 mmol/L (ref 0.5–1.9)

## 2018-01-31 LAB — CBC WITH DIFFERENTIAL/PLATELET
Abs Immature Granulocytes: 0.4 10*3/uL — ABNORMAL HIGH (ref 0.00–0.07)
Basophils Absolute: 0.1 10*3/uL (ref 0.0–0.1)
Basophils Relative: 0 %
Eosinophils Absolute: 0 10*3/uL (ref 0.0–0.5)
Eosinophils Relative: 0 %
HCT: 42.8 % (ref 39.0–52.0)
Hemoglobin: 13.2 g/dL (ref 13.0–17.0)
Immature Granulocytes: 2 %
Lymphocytes Relative: 11 %
Lymphs Abs: 3 10*3/uL (ref 0.7–4.0)
MCH: 26.7 pg (ref 26.0–34.0)
MCHC: 30.8 g/dL (ref 30.0–36.0)
MCV: 86.5 fL (ref 80.0–100.0)
Monocytes Absolute: 1.9 10*3/uL — ABNORMAL HIGH (ref 0.1–1.0)
Monocytes Relative: 7 %
Neutro Abs: 21.3 10*3/uL — ABNORMAL HIGH (ref 1.7–7.7)
Neutrophils Relative %: 80 %
Platelets: 193 10*3/uL (ref 150–400)
RBC: 4.95 MIL/uL (ref 4.22–5.81)
RDW: 14.3 % (ref 11.5–15.5)
WBC: 26.7 10*3/uL — ABNORMAL HIGH (ref 4.0–10.5)
nRBC: 0 % (ref 0.0–0.2)

## 2018-01-31 LAB — COMPREHENSIVE METABOLIC PANEL WITH GFR
ALT: 29 U/L (ref 0–44)
AST: 38 U/L (ref 15–41)
Albumin: 2.9 g/dL — ABNORMAL LOW (ref 3.5–5.0)
Alkaline Phosphatase: 73 U/L (ref 38–126)
Anion gap: 13 (ref 5–15)
BUN: 16 mg/dL (ref 8–23)
CO2: 25 mmol/L (ref 22–32)
Calcium: 9 mg/dL (ref 8.9–10.3)
Chloride: 97 mmol/L — ABNORMAL LOW (ref 98–111)
Creatinine, Ser: 1.03 mg/dL (ref 0.61–1.24)
GFR calc Af Amer: >60 mL/min
GFR calc non Af Amer: >60 mL/min
Glucose, Bld: 80 mg/dL (ref 70–99)
Potassium: 3.6 mmol/L (ref 3.5–5.1)
Sodium: 135 mmol/L (ref 135–145)
Total Bilirubin: 1.5 mg/dL — ABNORMAL HIGH (ref 0.3–1.2)
Total Protein: 6.8 g/dL (ref 6.5–8.1)

## 2018-01-31 LAB — URINALYSIS, ROUTINE W REFLEX MICROSCOPIC
Bilirubin Urine: NEGATIVE
Glucose, UA: NEGATIVE mg/dL
Hgb urine dipstick: NEGATIVE
Ketones, ur: NEGATIVE mg/dL
Leukocytes, UA: NEGATIVE
Nitrite: NEGATIVE
PROTEIN: NEGATIVE mg/dL
Specific Gravity, Urine: 1.005 (ref 1.005–1.030)
pH: 6 (ref 5.0–8.0)

## 2018-01-31 LAB — TROPONIN I
TROPONIN I: 0.14 ng/mL — AB (ref <0.03)
TROPONIN I: 0.09 ng/mL — AB (ref <0.03)
Troponin I: 0.13 ng/mL (ref <0.03)

## 2018-01-31 LAB — RAPID URINE DRUG SCREEN, HOSP PERFORMED
Amphetamines: NOT DETECTED
Barbiturates: NOT DETECTED
Benzodiazepines: NOT DETECTED
Cocaine: NOT DETECTED
Opiates: NOT DETECTED
Tetrahydrocannabinol: NOT DETECTED

## 2018-01-31 LAB — BRAIN NATRIURETIC PEPTIDE: B Natriuretic Peptide: 461.3 pg/mL — ABNORMAL HIGH (ref 0.0–100.0)

## 2018-01-31 LAB — HEMOGLOBIN A1C
HEMOGLOBIN A1C: 5.4 % (ref 4.8–5.6)
Mean Plasma Glucose: 108.28 mg/dL

## 2018-01-31 LAB — LIPID PANEL
Cholesterol: 86 mg/dL (ref 0–200)
HDL: 32 mg/dL — ABNORMAL LOW (ref >40)
LDL Cholesterol: 44 mg/dL (ref 0–99)
Total CHOL/HDL Ratio: 2.7 RATIO
Triglycerides: 51 mg/dL (ref <150)
VLDL: 10 mg/dL (ref 0–40)

## 2018-01-31 LAB — MRSA PCR SCREENING: MRSA by PCR: NEGATIVE

## 2018-01-31 LAB — INFLUENZA PANEL BY PCR (TYPE A & B)
Influenza A By PCR: NEGATIVE
Influenza B By PCR: NEGATIVE

## 2018-01-31 LAB — I-STAT CG4 LACTIC ACID, ED: Lactic Acid, Venous: 0.99 mmol/L (ref 0.5–1.9)

## 2018-01-31 LAB — PROCALCITONIN: Procalcitonin: 4.76 ng/mL

## 2018-01-31 LAB — STREP PNEUMONIAE URINARY ANTIGEN: STREP PNEUMO URINARY ANTIGEN: NEGATIVE

## 2018-01-31 MED ORDER — ONDANSETRON HCL 4 MG/2ML IJ SOLN: 4.0000 mg | Freq: Three times a day (TID) | INTRAMUSCULAR | Status: DC | PRN

## 2018-01-31 MED ORDER — SODIUM CHLORIDE 0.9 % IV BOLUS
500.0000 mL | Freq: Once | INTRAVENOUS | Status: AC
  Administered 2018-01-31: 500 mL via INTRAVENOUS

## 2018-01-31 MED ORDER — AZITHROMYCIN 500 MG PO TABS
500.0000 mg | ORAL_TABLET | Freq: Every day | ORAL | Status: AC
  Administered 2018-01-31: 500 mg via ORAL
  Filled 2018-01-31: qty 1

## 2018-01-31 MED ORDER — ATORVASTATIN CALCIUM 80 MG PO TABS
80.0000 mg | ORAL_TABLET | Freq: Every day | ORAL | Status: DC
  Administered 2018-01-31: 80 mg via ORAL
  Filled 2018-01-31: qty 1

## 2018-01-31 MED ORDER — HYDROCORTISONE NA SUCCINATE PF 100 MG IJ SOLR
50.0000 mg | Freq: Three times a day (TID) | INTRAMUSCULAR | Status: DC
  Administered 2018-01-31: 50 mg via INTRAVENOUS
  Filled 2018-01-31: qty 2

## 2018-01-31 MED ORDER — FUROSEMIDE 40 MG PO TABS
40.0000 mg | ORAL_TABLET | Freq: Two times a day (BID) | ORAL | Status: DC
  Administered 2018-01-31 – 2018-02-01 (×2): 40 mg via ORAL
  Filled 2018-01-31 (×2): qty 1

## 2018-01-31 MED ORDER — CLOBETASOL PROPIONATE 0.05 % EX CREA
TOPICAL_CREAM | Freq: Two times a day (BID) | CUTANEOUS | Status: DC
  Administered 2018-01-31 – 2018-02-01 (×3): via TOPICAL
  Filled 2018-01-31: qty 15

## 2018-01-31 MED ORDER — HYDRALAZINE HCL 20 MG/ML IJ SOLN: 5.0000 mg | INTRAMUSCULAR | Status: DC | PRN

## 2018-01-31 MED ORDER — ACETAMINOPHEN 325 MG PO TABS: 650.0000 mg | ORAL_TABLET | Freq: Four times a day (QID) | ORAL | Status: DC | PRN

## 2018-01-31 MED ORDER — DM-GUAIFENESIN ER 30-600 MG PO TB12
1.0000 | ORAL_TABLET | Freq: Two times a day (BID) | ORAL | Status: DC | PRN
  Administered 2018-01-31 – 2018-02-01 (×2): 1 via ORAL
  Filled 2018-01-31 (×2): qty 1

## 2018-01-31 MED ORDER — SODIUM CHLORIDE 0.9 % IV BOLUS
500.0000 mL | Freq: Once | INTRAVENOUS | Status: DC
  Administered 2018-01-31: 500 mL via INTRAVENOUS

## 2018-01-31 MED ORDER — CLOPIDOGREL BISULFATE 75 MG PO TABS
75.0000 mg | ORAL_TABLET | Freq: Every day | ORAL | Status: DC
  Administered 2018-01-31 – 2018-02-01 (×2): 75 mg via ORAL
  Filled 2018-01-31 (×2): qty 1

## 2018-01-31 MED ORDER — ZOLPIDEM TARTRATE 5 MG PO TABS: 5.0000 mg | ORAL_TABLET | Freq: Every evening | ORAL | Status: DC | PRN

## 2018-01-31 MED ORDER — HYDROCORTISONE NA SUCCINATE PF 100 MG IJ SOLR
100.0000 mg | Freq: Once | INTRAMUSCULAR | Status: AC
  Administered 2018-01-31: 100 mg via INTRAVENOUS
  Filled 2018-01-31: qty 2

## 2018-01-31 MED ORDER — AZITHROMYCIN 250 MG PO TABS
250.0000 mg | ORAL_TABLET | Freq: Every day | ORAL | Status: DC
  Administered 2018-02-01: 250 mg via ORAL
  Filled 2018-01-31: qty 1

## 2018-01-31 MED ORDER — SODIUM CHLORIDE 0.9 % IV SOLN
1.0000 g | Freq: Three times a day (TID) | INTRAVENOUS | Status: DC
  Administered 2018-01-31 – 2018-02-01 (×4): 1 g via INTRAVENOUS
  Filled 2018-01-31 (×7): qty 1

## 2018-01-31 MED ORDER — SODIUM CHLORIDE 0.9 % IV SOLN
INTRAVENOUS | Status: DC | PRN
  Administered 2018-01-31 (×2): 250 mL via INTRAVENOUS

## 2018-01-31 MED ORDER — ENOXAPARIN SODIUM 40 MG/0.4ML ~~LOC~~ SOLN
40.0000 mg | SUBCUTANEOUS | Status: DC
  Administered 2018-01-31 – 2018-02-01 (×2): 40 mg via SUBCUTANEOUS
  Filled 2018-01-31 (×2): qty 0.4

## 2018-01-31 MED ORDER — ALBUTEROL SULFATE (2.5 MG/3ML) 0.083% IN NEBU: 2.5000 mg | INHALATION_SOLUTION | RESPIRATORY_TRACT | Status: DC | PRN

## 2018-01-31 MED ORDER — VANCOMYCIN HCL IN DEXTROSE 1-5 GM/200ML-% IV SOLN
1000.0000 mg | Freq: Two times a day (BID) | INTRAVENOUS | Status: DC
  Filled 2018-01-31: qty 200

## 2018-01-31 MED ORDER — HYDROCORTISONE NA SUCCINATE PF 100 MG IJ SOLR
50.0000 mg | Freq: Two times a day (BID) | INTRAMUSCULAR | Status: DC
  Administered 2018-01-31 – 2018-02-01 (×2): 50 mg via INTRAVENOUS
  Filled 2018-01-31 (×2): qty 2

## 2018-01-31 MED ORDER — PANTOPRAZOLE SODIUM 40 MG PO TBEC
40.0000 mg | DELAYED_RELEASE_TABLET | Freq: Two times a day (BID) | ORAL | Status: DC
  Administered 2018-01-31 – 2018-02-01 (×3): 40 mg via ORAL
  Filled 2018-01-31 (×3): qty 1

## 2018-01-31 NOTE — Progress Notes (Signed)
PROGRESS NOTE  Brief Narrative: Curtis Flores is a 62 y.o. male with a history of chronic HFrEF,pulmonary fibrosis, RA on orencia, azathioprine, and chronic prednisone, and AFlutter who presented with cough and fever worsening over 2 days found to have sepsis and CXR infiltrate consistent with right suprahilar and basilar pneumonia. He was hypoxic in the ED, and with immunosuppressed status, admitted this morning by Dr. Blaine Hamper. Lactic acid was normal and BNP was 461 though he received IV fluids, IV antibiotics, and stress-dose steroids. Troponin was modestly elevated at 0.13, 0.14 in the absence of chest pain or ischemic ECG changes, but decreased to 0.09 not consistent with ACS. Lasix has been held.   Subjective: Feels improved from admission, still short of breath with any exertion. He normally has no exertional dyspnea at all.   Objective: BP 131/73 (BP Location: Left Arm)   Pulse 68   Temp 98.9 F (37.2 C) (Oral)   Resp 18   Ht 5\' 7"  (1.702 m)   Wt 89.1 kg   SpO2 97%   BMI 30.76 kg/m   Gen: 62yo M pleasant and in no distress Pulm: Focal crackles at right lower and mid lung zones posteriorly. No other adventitious sounds. He is tachypneic but in no distress.  CV: RRR, no murmur, no JVD, no edema GI: Soft, NT, ND, +BS  Neuro: Alert and oriented. No focal deficits. Skin: No rashes, lesions or ulcers  Assessment & Plan: Sepsis due to right lower lobar pneumonia in immunosuppressed patient:  - Continue broad coverage pending culture data. Sputum culture to be collected. HIV also pending. Check MRSA PCR for consideration of deescalation. - Taper stress steroids as able - Wean oxygen and monitor respiratory effort over next 24 hours. If improving, can DC home.  - Continue holding BP meds for now, but will restart lasix with elevated BNP  Continue other medications as ordered for chronic conditions or as noted in H&P from this AM.  Patrecia Pour, MD 01/31/2018, 1:50 PM

## 2018-01-31 NOTE — Plan of Care (Signed)
  Problem: Nutrition: Goal: Adequate nutrition will be maintained Outcome: Completed/Met   Problem: Coping: Goal: Level of anxiety will decrease Outcome: Completed/Met   Problem: Elimination: Goal: Will not experience complications related to urinary retention Outcome: Completed/Met   Problem: Pain Managment: Goal: General experience of comfort will improve Outcome: Completed/Met   Problem: Safety: Goal: Ability to remain free from injury will improve Outcome: Completed/Met   Problem: Skin Integrity: Goal: Risk for impaired skin integrity will decrease Outcome: Completed/Met

## 2018-01-31 NOTE — Progress Notes (Signed)
Patient arrived to [facility] from ED. Patient alert and oriented. Family at bedside. Both patient and family educated on droplet precautions and undated on plan of care. Verbalized understanding of teaching. No complaints at this time. Will continue to monitor patient.

## 2018-01-31 NOTE — H&P (Signed)
History and Physical    Curtis Flores IRS:[id number] DOB: December 18, 1955 DOA: 01/30/2018  Referring MD/NP/PA:   PCP: Dione Housekeeper, MD   Patient coming from:  The patient is coming from home.  At baseline, pt is independent for most of ADL.        Chief Complaint: Cough, shortness breath, chest pain, fever and chills  HPI: Curtis Flores is a 62 y.o. male with medical history significant of hypertension, hyperlipidemia, GERD, CHF with EF of 40%, CAD, pulmonary fibrosis, rheumatoid arthritis on immunosuppressant, atrial flutter, who presents with cough, shortness breath, chest pain, fever and chills.  Patient states that he has been having dry cough, shortness breath and chest pain for more than 3 days, which has been progressively getting worse.  He also has fever and chills.  His chest pain is located in the right side of chest, intermittent, initially 6 out of 10 severity, currently chest pain-free, nonradiating.  Patient has oxygen desaturation to 88% in the ED.  Has little runny nose, no sore throat.  Denies nausea, vomiting, diarrhea, abdominal pain, symptoms of UTI or unilateral weakness.  ED Course: pt was found to have WBC 26.7, BMP 461.3, lactic acid 1.62, 0.99, troponin +0.13, electrolytes renal function okay, temperature 102.6, heart rate 40s to 80s, tachypnea, chest x-ray showed right suprahilar and bibasilar density and known interstitial lung disease. Patient is admitted to telemetry bed as inpatient.    Review of Systems:   General: has fevers, chills, no body weight gain, has poor appetite, has fatigue HEENT: no blurry vision, hearing changes or sore throat Respiratory: has dyspnea, coughing, no wheezing CV: has chest pain, no palpitations GI: no nausea, vomiting, abdominal pain, diarrhea, constipation GU: no dysuria, burning on urination, increased urinary frequency, hematuria  Ext: has leg edema Neuro: no unilateral weakness, numbness, or tingling, no vision change or  hearing loss Skin: has psoriatic rash on the left side of the scalp. no skin tear. MSK: No muscle spasm, no deformity, no limitation of range of movement in spin Heme: No easy bruising.  Travel history: No recent long distant travel.  Allergy: No Known Allergies  Past Medical History:  Diagnosis Date  . Abnormal CT scan, stomach   . Chronic systolic dysfunction of left ventricle   . Coronary artery disease    lateral STEMI 02/22/15  . GERD (gastroesophageal reflux disease)   . Hyperlipidemia   . Hypertension   . Ischemic cardiomyopathy   . LBBB (left bundle branch block)   . Leukocytosis    followed by hematology, reactive  . Lymphadenopathy   . Myocardial infarction ([place]) 02/2015  . Psoriasis 2003  . Psoriatic arthritis ([place])   . Pulmonary fibrosis ([place])   . Rheumatoid arthritis(714.0) 2012  . Typical atrial flutter [facility])     Past Surgical History:  Procedure Laterality Date  . CARDIAC CATHETERIZATION N/A 02/22/2015   Procedure: Left Heart Cath and Coronary Angiography;  Surgeon: Lorretta Harp, MD;  Location: [place] CV LAB;  Service: Cardiovascular;  Laterality: N/A;  . CARDIAC CATHETERIZATION N/A 02/22/2015   Procedure: Coronary Stent Intervention;  Surgeon: Lorretta Harp, MD;  Location: [place] CV LAB;  Service: Cardiovascular;  Laterality: N/A;  . CARDIOVERSION N/A 04/12/2015   Procedure: CARDIOVERSION;  Surgeon: Larey Dresser, MD;  Location[facility] ENDOSCOPY;  Service: Cardiovascular;  Laterality: N/A;  . COLONOSCOPY  07/01/2003   KKX:FGHWEX colonic mucosa except for the proximal right colon in the area of ICV which was not seen  completely due to inadequate bowel prep. followed with ACBE which was normal.   . COLONOSCOPY N/A 08/24/2015   Dr. Gala Romney: Normal colon. Next colonoscopy in 10 years.  [name] ELECTROPHYSIOLOGIC STUDY N/A 05/30/2015   Atrial fibrillation ablation by Dr Rayann Heman  . ESOPHAGOGASTRODUODENOSCOPY N/A 08/24/2015   Dr. Gala Romney: Medium-sized hiatal  hernia, erosive gastropathy. Cameron lesions. Esophageal mucosa distally suggestive of short segment Barrett's esophagus. Not confirmed on biopsy. Gastric biopsy with minimal chronic inflammation  . GIVENS CAPSULE STUDY N/A 04/17/2016   Procedure: GIVENS CAPSULE STUDY;  Surgeon: Daneil Dolin, MD;  [facility];  Service: Endoscopy;  Laterality: N/A;  Pt to arrive at 8:00 am for 8:30 am appt  . TEE WITHOUT CARDIOVERSION N/A 04/12/2015   Procedure: TRANSESOPHAGEAL ECHOCARDIOGRAM (TEE);  Surgeon: Larey Dresser, MD;  Location: [place];  Service: Cardiovascular;  Laterality: N/A;    Social History:  reports that he quit smoking about 14 years ago. His smoking use included cigarettes. He has a 30.00 pack-year smoking history. He has never used smokeless tobacco. He reports that he does not drink alcohol or use drugs.  Family History:  Family History  Problem Relation Age of Onset  . Hypertension Mother   . Colon cancer Neg Hx      Prior to Admission medications   Medication Sig Start Date End Date Taking? Authorizing Provider  Abatacept (ORENCIA CLICKJECT) 809 MG/ML SOAJ Inject 125 mg into the skin once a week. 10/14/17  Yes Deveshwar, Abel Presto, MD  atorvastatin (LIPITOR) 80 MG tablet TAKE 1 TABLET (80 MG TOTAL) BY MOUTH DAILY AT 6 PM. 12/29/17  Yes Lorretta Harp, MD  azaTHIOprine (IMURAN) 50 MG tablet TAKE 1 TABLET BY MOUTH TWICE A DAY Patient taking differently: Take 50 mg by mouth 2 (two) times daily.  01/28/18  Yes Deveshwar, Abel Presto, MD  carvedilol (COREG) 12.5 MG tablet TAKE 1 AND 1/2 TABLETS BY MOUTH TWICE DAILY Patient taking differently: Take 18.75 mg by mouth 2 (two) times daily with a meal.  01/19/18  Yes Lorretta Harp, MD  Clobetasol Prop Emollient Base (CLOBETASOL PROPIONATE E) 0.05 % emollient cream Apply to affected area BID Patient taking differently: Apply topically 2 (two) times daily.  03/27/17  Yes Deveshwar, Abel Presto, MD  clopidogrel (PLAVIX) 75 MG tablet  Take 1 tablet (75 mg total) by mouth daily. 11/29/16  Yes Lorretta Harp, MD  furosemide (LASIX) 40 MG tablet TAKE 1 TABLET BY MOUTH TWICE A DAY Patient taking differently: Take 40 mg by mouth 2 (two) times daily.  12/02/17  Yes Lorretta Harp, MD  losartan (COZAAR) 100 MG tablet TAKE 1 TABLET BY MOUTH EVERY DAY Patient taking differently: Take 100 mg by mouth daily.  07/29/17  Yes Lorretta Harp, MD  pantoprazole (PROTONIX) 40 MG tablet Take 1 tablet (40 mg total) by mouth 2 (two) times daily before a meal. 02/12/17  Yes Annitta Needs, NP  predniSONE ([place]) 5 MG tablet TAKE 2 TABLETS BY MOUTH EVERY DAY Patient taking differently: Take 10 mg by mouth daily with breakfast.  12/02/17  Yes Deveshwar, Abel Presto, MD  clopidogrel (PLAVIX) 75 MG tablet TAKE 1 TABLET BY MOUTH EVERY DAY Patient not taking: Reported on 01/31/2018 01/28/18   Lorretta Harp, MD  predniSONE (DELTASONE) 5 MG tablet TAKE 2 TABLETS BY MOUTH EVERY DAY Patient not taking: Reported on 01/31/2018 12/01/17   Bo Merino, MD    Physical Exam: Vitals:   01/31/18 0145 01/31/18 0200 01/31/18 0209 01/31/18 0230  BP:  (!) 107/55 (!) 114/46 (!) 118/53  Pulse: 65 (!) 48 (!) 46 (!) 49  Resp: (!) 24 18 18  (!) 26  Temp:      TempSrc:      SpO2: 93% 93% 92% 93%  Weight:      Height:       General: Not in acute distress HEENT:       Eyes: PERRL, EOMI, no scleral icterus.       ENT: No discharge from the ears and nose, no pharynx injection, no tonsillar enlargement.        Neck: No JVD, no bruit, no mass felt. Heme: No neck lymph node enlargement. Cardiac: S1/S2, RRR, No murmurs, No gallops or rubs. Respiratory: has fine crackles bilaterally. GI: Soft, nondistended, nontender, no rebound pain, no organomegaly, BS present. GU: No hematuria Ext: 1+ pitting leg edema bilaterally. 2+DP/PT pulse bilaterally. Musculoskeletal: No joint deformities, No joint redness or warmth, no limitation of ROM in spin. Skin: has  psoriatic rash on the left side of the scalp Neuro: Alert, oriented X3, cranial nerves II-XII grossly intact, moves all extremities normally.  Psych: Patient is not psychotic, no suicidal or hemocidal ideation.  Labs on Admission: I have personally reviewed following labs and imaging studies  CBC: Recent Labs  Lab 01/28/18 1249 01/30/18 2339  WBC 20.6* 26.7*  NEUTROABS 16.8* 21.3*  HGB 14.1 13.2  HCT 44.3 42.8  MCV 88.4 86.5  PLT 252 680   Basic Metabolic Panel: Recent Labs  Lab 01/28/18 1249 01/30/18 2339  NA 141 135  K 4.1 3.6  CL 106 97*  CO2 29 25  GLUCOSE 112* 80  BUN 13 16  CREATININE 0.84 1.03  CALCIUM 9.1 9.0   GFR: Estimated Creatinine Clearance: 77 mL/min (by C-G formula based on SCr of 1.03 mg/dL). Liver Function Tests: Recent Labs  Lab 01/28/18 1249 01/30/18 2339  AST 23 38  ALT 25 29  ALKPHOS 57 73  BILITOT 0.6 1.5*  PROT 6.9 6.8  ALBUMIN 3.4* 2.9*   No results for input(s): LIPASE, AMYLASE in the last 168 hours. No results for input(s): AMMONIA in the last 168 hours. Coagulation Profile: No results for input(s): INR, PROTIME in the last 168 hours. Cardiac Enzymes: Recent Labs  Lab 01/30/18 2339  TROPONINI 0.13*   BNP (last 3 results) No results for input(s): PROBNP in the last 8760 hours. HbA1C: No results for input(s): HGBA1C in the last 72 hours. CBG: No results for input(s): GLUCAP in the last 168 hours. Lipid Profile: No results for input(s): CHOL, HDL, LDLCALC, TRIG, CHOLHDL, LDLDIRECT in the last 72 hours. Thyroid Function Tests: No results for input(s): TSH, T4TOTAL, FREET4, T3FREE, THYROIDAB in the last 72 hours. Anemia Panel: Recent Labs    01/28/18 1248  FERRITIN 188   Urine analysis: No results found for: COLORURINE, APPEARANCEUR, LABSPEC, PHURINE, GLUCOSEU, HGBUR, BILIRUBINUR, KETONESUR, PROTEINUR, UROBILINOGEN, NITRITE, LEUKOCYTESUR Sepsis Labs: @LABRCNTIP (procalcitonin:4,lacticidven:4) )No results found for this  or any previous visit (from the past 240 hour(s)).   Radiological Exams on Admission: Dg Chest 2 View  Result Date: 01/31/2018 CLINICAL DATA:  Shortness of breath. Cough. Weakness. EXAM: CHEST - 2 VIEW COMPARISON:  Radiograph 08/20/2016 chest CT 05/12/2015 FINDINGS: Borderline cardiomegaly with aortic atherosclerosis. Patient with known interstitial lung disease with peripheral reticulation in a basilar predominant pattern. Increased right perihilar opacities with slight increased density at the bases since prior radiograph. No large pleural effusion or pneumothorax. Unchanged osseous structures. IMPRESSION: Known interstitial lung disease. Increased  right suprahilar opacity and increased bibasilar density in the region of chronic lung disease may represent progression of chronic disease versus superimposed pneumonia or atelectasis. Electronically Signed   By: Keith Rake M.D.   On: 01/31/2018 00:43     EKG: Independently reviewed.  Sinus rhythm, QTC 454, LAD, left bundle blockade which is old, poor R wave progression   Assessment/Plan Principal Problem:   Lobar pneumonia (HCC) Active Problems:   Pulmonary fibrosis (HCC)   Rheumatoid arthritis (HCC)   Hyperlipidemia   Atrial flutter (HCC)   LBBB (left bundle branch block)   GERD (gastroesophageal reflux disease)   Essential hypertension   Sepsis ([place])   Acute respiratory failure with hypoxia (HCC)   CAD (coronary artery disease)   Elevated troponin   Sepsis due to lobar pneumonia [facility]): Patient meets criteria for sepsis with leukocytosis, fever and tachycardia.  Lactic acid is normal.  Currently hemodynamically stable. CURB-65 score is 2 (YK>99 and diastolic blood pressure less than 60) -->Moderate severity (risk of death 9%). Patient is immunosuppressed, wil need antibiotics with broad coverage.   - will admit to tele bed as inpt - IV Vancomycin and cefepime, Z-pack - Mucinex for cough  - prn Albuterol Nebs prn for SOB -  Urine legionella and S. pneumococcal antigen - Follow up blood culture x2, sputum culture and plus Flu pcr - will get Procalcitonin and trend lactic acid level per sepsis protocol - IVF: 500 L of NS bolus in ED. Will bolus as needed (pt has EF 40% and elevvated BNP 461, at risk of developing fluid overload)  Rheumatoid arthritis: Patient is on weekly Orencia injection, Imuran and prednisone 10 mg daily -hold Orencia and Imuran -Change prednisone to stress dose Solu-Cortef: Received 100 mg of Solu-Cortef in ED, will continue 50 mg 3 times daily  Pulmonary fibrosis ([place]): On chronic steroid and immunosuppressant at home, which is also for rheumatoid arthritis. -Stress dose Solu-Cortef  Hyperlipidemia: Lipitor  Hypertension: -Hold blood pressure medications: Coreg, Cozaar and Lasix since patient has risk of developing hypotension due to sepsis -IV hydralazine PRN  Atrial flutter/fibrillation: CHA2DS2-VASc Score is 3, needs oral anticoagulation, but pt is not on AC.  Possibly due to history of GI bleeding.  Heart rate is well controlled.  Heart rate 40s-80s -Hold Coreg since patient has risk of developing hypotension due to sepsis  GERD: -Protonix  Hx of CAD (coronary artery disease) and Elevated troponin: s/p of stent. Troponin 0.13.  Patient had chest pain, which has resolved currently.  Most likely due to demand ischemia. -Continue Plavix, Lipitor, and Coreg -Will not start aspirin due to history of GI bleeding -Check A1c, FLP, UDS -Troponin x3 -Repeat EKG in AM   Inpatient status:  # Patient requires inpatient status due to high intensity of service, high risk for further deterioration and high frequency of surveillance required.  I certify that at the point of admission it is my clinical judgment that the patient will require inpatient hospital care spanning beyond 2 midnights from the point of admission.  . This patient has multiple chronic comorbidities including hypertension,  hyperlipidemia, GERD, CHF with EF of 40%, CAD, pulmonary fibrosis, rheumatoid arthritis on immunosuppressant, atrial flutter  . Now patient has presenting symptoms include cough, shortness breath, chest pain, fever and chills. . The worrisome physical exam findings include fine crackles bilaterally on auscultation, 1+ leg edema . The initial radiographic and laboratory data are worrisome because of leukocytosis, bilateral pulmonary opacity on chest x-ray, elevated BNP, elevated troponin .  Current medical needs: please see my assessment and plan . Predictability of an adverse outcome (risk): Patient has multiple comorbidities, including immunosuppressants status, CHF with EF of 40%, now presents with sepsis due to lobar pneumonia.  Patient will need IV fluid, but he already has elevated BNP and EF with only 40%, limiting aggressive fluid for sepsis.  Patient is at high risk of deteriorating, will need to stay in hospital for at least 2 days.       DVT ppx: SQ Lovenox Code Status: Full code Family Communication:   Yes, patient's fianc   at bed side Disposition Plan:  Anticipate discharge back to previous home environment Consults called: None Admission status:   Inpatient/tele     Date of Service 01/31/2018    Ivor Costa [facility] Pager [phone]  If 7PM-7AM, please contact night-coverage [email or web address] Password [email or web address] 01/31/2018, 3:16 AM

## 2018-02-01 DIAGNOSIS — J9601 Acute respiratory failure with hypoxia: Secondary | ICD-10-CM

## 2018-02-01 DIAGNOSIS — I483 Typical atrial flutter: Secondary | ICD-10-CM

## 2018-02-01 LAB — EXPECTORATED SPUTUM ASSESSMENT W GRAM STAIN, RFLX TO RESP C

## 2018-02-01 LAB — LEGIONELLA PNEUMOPHILA SEROGP 1 UR AG: L. pneumophila Serogp 1 Ur Ag: NEGATIVE

## 2018-02-01 LAB — EXPECTORATED SPUTUM ASSESSMENT W REFEX TO RESP CULTURE

## 2018-02-01 MED ORDER — CEFPODOXIME PROXETIL 100 MG PO TABS: 200.0000 mg | ORAL_TABLET | Freq: Two times a day (BID) | ORAL | 0 refills | Status: AC

## 2018-02-01 MED ORDER — AZITHROMYCIN 250 MG PO TABS: 250.0000 mg | ORAL_TABLET | Freq: Every day | ORAL | 0 refills | Status: DC

## 2018-02-01 MED ORDER — PREDNISONE 10 MG PO TABS: ORAL_TABLET | ORAL | 0 refills | Status: DC

## 2018-02-01 MED ORDER — ABATACEPT 125 MG/ML ~~LOC~~ SOAJ: 125.0000 mg | SUBCUTANEOUS | 0 refills | Status: DC

## 2018-02-01 NOTE — Progress Notes (Signed)
SATURATION QUALIFICATIONS: (This note is used to comply with regulatory documentation for home oxygen)  Patient Saturations on Room Air at Rest =97%  Patient Saturations on Room Air while Ambulating = 87%  Patient Saturations on 2 Liters of oxygen while Ambulating =94%

## 2018-02-01 NOTE — Discharge Summary (Addendum)
Physician Discharge Summary  Curtis Flores LFY:[id number] DOB: 1955-10-13 DOA: 01/30/2018  PCP: Dione Housekeeper, MD  Admit date: 01/30/2018 Discharge date: 02/01/2018  Admitted From: Home Disposition: Home   Recommendations for Outpatient Follow-up:  1. Follow up with PCP in 1-2 weeks 2. Follow up with pulmonology in the next week. Pt symptomatically improved, still qualifies for 2L O2.   Home Health: None Equipment/Devices: 2L O2 with exertion Discharge Condition: Stable CODE STATUS: Full Diet recommendation: Heart healthy  Brief/Interim Summary: Curtis Flores is a 62 y.o. male with a history of chronic HFrEF,pulmonary fibrosis, RA on orencia, azathioprine, and chronic prednisone, and AFlutter who presented with cough and fever worsening over 2 days found to have sepsis and CXR infiltrate consistent with right suprahilar and basilar pneumonia. He was hypoxic in the ED, and with immunosuppressed status, admitted. Lactic acid was normal and BNP was 461 though he received IV fluids, IV antibiotics, and stress-dose steroids. Troponin was modestly elevated at 0.13, 0.14 in the absence of chest pain or ischemic ECG changes, but decreased to 0.09 not consistent with ACS. Lasix has been held, and antibiotics continued with resolution of hypoxia and return to bseline functional/respiratory status. He is stable for discharge to complete a course of antibiotics, steroid taper back to home dose, and PCP follow up.  Discharge Diagnoses:  Principal Problem:   Lobar pneumonia ([place]) Active Problems:   Pulmonary fibrosis (HCC)   Rheumatoid arthritis (HCC)   Hyperlipidemia   Atrial flutter (HCC)   LBBB (left bundle branch block)   GERD (gastroesophageal reflux disease)   Essential hypertension   Sepsis ([place])   Acute respiratory failure with hypoxia (HCC)   CAD (coronary artery disease)   Elevated troponin  Sepsis due to right lower lobar pneumonia in immunosuppressed patient: Flu negative, MRSA  negative, Strep UAg negative. Blood cultures NGTD, sputum without growth at discharge.  - Continue vantin and azithromycin by mouth - Taper stress steroids  Rheumatoid arthritis: Patient is on weekly Orencia injection, Imuran and prednisone 10 mg daily - Follow up with rheumatology to decide on restart of medications.  Pulmonary fibrosis ([place]): On chronic steroid and immunosuppressant at home, which is also for rheumatoid arthritis. - Continues steroids as above  Hyperlipidemia: - Lipitor  Hypertension: - Ok to restart home medications. Becoming hypertensive.  Atrial flutter/fibrillation:  - continue coreg, not on AC due to GI bleeding per prior notes  GERD: - Protonix  Hx of CAD (coronary artery disease) and Elevated troponin: Demand ischemia, chest pain resolved and troponins trended downward without ischemic ECG changes.   Discharge Instructions Discharge Instructions    Diet - low sodium heart healthy   Complete by:  As directed    Discharge instructions   Complete by:  As directed    You were admitted for pneumonia and have improved with antibiotics as well as increased doses of steroids. You are stable for discharge but will need to continue antibiotics (azithromycin for 3 more days and vantin for 7 more days) as well as steroids (a new prescription for a taper back to your typical 10mg  dose was printed and needs to be provided to you prior to discharge). Call Dr. Estanislado Pandy tomorrow to ask about when to restart orencia, or seek medical attention sooner if your symptoms worsen.   Increase activity slowly   Complete by:  As directed      Allergies as of 02/01/2018   No Known Allergies     Medication List    TAKE  these medications   Abatacept 125 MG/ML Soaj Inject 125 mg into the skin once a week. ask rheumatologist about restarting after pneumonia What changed:  additional instructions   atorvastatin 80 MG tablet Commonly known as:  LIPITOR TAKE 1 TABLET (80  MG TOTAL) BY MOUTH DAILY AT 6 PM.   azaTHIOprine 50 MG tablet Commonly known as:  IMURAN TAKE 1 TABLET BY MOUTH TWICE A DAY   azithromycin 250 MG tablet Commonly known as:  ZITHROMAX Take 1 tablet (250 mg total) by mouth daily. Start taking on:  02/02/2018   carvedilol 12.5 MG tablet Commonly known as:  COREG TAKE 1 AND 1/2 TABLETS BY MOUTH TWICE DAILY What changed:  when to take this   cefpodoxime 100 MG tablet Commonly known as:  VANTIN Take 2 tablets (200 mg total) by mouth 2 (two) times daily for 7 days. Start taking on:  02/02/2018   Clobetasol Prop Emollient Base 0.05 % emollient cream Apply to affected area BID What changed:    how to take this  when to take this  additional instructions   clopidogrel 75 MG tablet Commonly known as:  PLAVIX Take 1 tablet (75 mg total) by mouth daily. What changed:  Another medication with the same name was removed. Continue taking this medication, and follow the directions you see here.   furosemide 40 MG tablet Commonly known as:  LASIX TAKE 1 TABLET BY MOUTH TWICE A DAY What changed:  when to take this   losartan 100 MG tablet Commonly known as:  COZAAR TAKE 1 TABLET BY MOUTH EVERY DAY   pantoprazole 40 MG tablet Commonly known as:  PROTONIX Take 1 tablet (40 mg total) by mouth 2 (two) times daily before a meal.   predniSONE 10 MG tablet Commonly known as:  DELTASONE Take 4 tablets (40 mg total) by mouth daily for 2 days, THEN 3 tablets (30 mg total) daily for 2 days, THEN 2 tablets (20 mg total) daily for 2 days, THEN 1 tablet (10 mg total) daily for 3 days. Start taking on:  02/01/2018 What changed:    medication strength  See the new instructions.  Another medication with the same name was removed. Continue taking this medication, and follow the directions you see here.      Follow-up Information    Dione Housekeeper, MD. Schedule an appointment as soon as possible for a visit in 1 week(s).   Specialty:  Family  Medicine Contact information: [place] [address] [phone]        Bo Merino, MD. Call on 02/02/2018.   Specialty:  Rheumatology Why:  to ask about restarting medications         No Known Allergies  Consultations:  None  Procedures/Studies: Dg Chest 2 View  Result Date: 01/31/2018 CLINICAL DATA:  Shortness of breath. Cough. Weakness. EXAM: CHEST - 2 VIEW COMPARISON:  Radiograph 08/20/2016 chest CT 05/12/2015 FINDINGS: Borderline cardiomegaly with aortic atherosclerosis. Patient with known interstitial lung disease with peripheral reticulation in a basilar predominant pattern. Increased right perihilar opacities with slight increased density at the bases since prior radiograph. No large pleural effusion or pneumothorax. Unchanged osseous structures. IMPRESSION: Known interstitial lung disease. Increased right suprahilar opacity and increased bibasilar density in the region of chronic lung disease may represent progression of chronic disease versus superimposed pneumonia or atelectasis. Electronically Signed   By: Keith Rake M.D.   On: 01/31/2018 00:43    Subjective: Feels better, no oxygen needed for past 24  hours. Feels he gets tired more easily than baseline but can easily get around in the room and walked in the hallway. No chest pain or palpitations or leg swelling or orthopnea.   Discharge Exam: Vitals:   02/01/18 0414 02/01/18 0952  BP: (!) 184/96 (!) 177/82  Pulse: 83 83  Resp: 14 18  Temp: (!) 97.4 F (36.3 C)   SpO2: 100% 100%   General: Pt is alert, awake, not in acute distress Cardiovascular: RRR, S1/S2 +, no rubs, no gallops Respiratory: Nonlabored on room air, very scant crackles on R middle zone. Abdominal: Soft, NT, ND, bowel sounds + Extremities: No edema, no cyanosis  Labs: BNP (last 3 results) Recent Labs    01/30/18 2348  BNP 761.6*   Basic Metabolic Panel: Recent Labs  Lab 01/28/18 1249 01/30/18 2339  NA  141 135  K 4.1 3.6  CL 106 97*  CO2 29 25  GLUCOSE 112* 80  BUN 13 16  CREATININE 0.84 1.03  CALCIUM 9.1 9.0   Liver Function Tests: Recent Labs  Lab 01/28/18 1249 01/30/18 2339  AST 23 38  ALT 25 29  ALKPHOS 57 73  BILITOT 0.6 1.5*  PROT 6.9 6.8  ALBUMIN 3.4* 2.9*   No results for input(s): LIPASE, AMYLASE in the last 168 hours. No results for input(s): AMMONIA in the last 168 hours. CBC: Recent Labs  Lab 01/28/18 1249 01/30/18 2339  WBC 20.6* 26.7*  NEUTROABS 16.8* 21.3*  HGB 14.1 13.2  HCT 44.3 42.8  MCV 88.4 86.5  PLT 252 193   Cardiac Enzymes: Recent Labs  Lab 01/30/18 2339 01/31/18 0259 01/31/18 0909  TROPONINI 0.13* 0.14* 0.09*   BNP: Invalid input(s): POCBNP CBG: No results for input(s): GLUCAP in the last 168 hours. D-Dimer No results for input(s): DDIMER in the last 72 hours. Hgb A1c Recent Labs    01/31/18 0259  HGBA1C 5.4   Lipid Profile Recent Labs    01/31/18 0259  CHOL 86  HDL 32*  LDLCALC 44  TRIG 51  CHOLHDL 2.7   Thyroid function studies No results for input(s): TSH, T4TOTAL, T3FREE, THYROIDAB in the last 72 hours.  Invalid input(s): FREET3 Anemia work up No results for input(s): VITAMINB12, FOLATE, FERRITIN, TIBC, IRON, RETICCTPCT in the last 72 hours. Urinalysis    Component Value Date/Time   COLORURINE YELLOW 01/31/2018 1200   APPEARANCEUR CLEAR 01/31/2018 1200   LABSPEC 1.005 01/31/2018 1200   PHURINE 6.0 01/31/2018 1200   GLUCOSEU NEGATIVE 01/31/2018 1200   HGBUR NEGATIVE 01/31/2018 1200   BILIRUBINUR NEGATIVE 01/31/2018 1200   KETONESUR NEGATIVE 01/31/2018 1200   PROTEINUR NEGATIVE 01/31/2018 1200   NITRITE NEGATIVE 01/31/2018 [place] 01/31/2018 1200    Microbiology Recent Results (from the past 240 hour(s))  Blood Culture (routine x 2)     Status: None (Preliminary result)   Collection Time: 01/30/18 11:41 PM  Result Value Ref Range Status   Specimen Description BLOOD LEFT  ANTECUBITAL  Final   Special Requests   Final    BOTTLES DRAWN AEROBIC AND ANAEROBIC Blood Culture adequate volume   Culture   Final    NO GROWTH 1 DAY Performed at [facility], [place] [address], [place], [place] [address]    Report Status PENDING  Incomplete  Blood Culture (routine x 2)     Status: None (Preliminary result)   Collection Time: 01/30/18 11:55 PM  Result Value Ref Range Status   Specimen Description BLOOD RIGHT HAND  Final   Special Requests   Final    BOTTLES DRAWN AEROBIC AND ANAEROBIC Blood Culture results may not be optimal due to an inadequate volume of blood received in culture bottles   Culture   Final    NO GROWTH 1 DAY Performed at [facility] [address] [place], [place] [address]    Report Status PENDING  Incomplete  MRSA PCR Screening     Status: None   Collection Time: 01/31/18  3:02 PM  Result Value Ref Range Status   MRSA by PCR NEGATIVE NEGATIVE Final    Comment:        The GeneXpert MRSA Assay (FDA approved for NASAL specimens only), is one component of a comprehensive MRSA colonization surveillance program. It is not intended to diagnose MRSA infection nor to guide or monitor treatment for MRSA infections. Performed at [facility], [place] [address], [place], [place] [address]     Time coordinating discharge: Approximately 40 minutes  Patrecia Pour, MD  [facility] 02/01/2018, 10:29 AM Pager [phone]

## 2018-02-01 NOTE — Progress Notes (Addendum)
Oxygen information provided by [facility]. Patient stated understanding and denies further questions and concerns. Family member present during education provided.  Peripheral IV's removed, clean, dry and intact, pressure and dressing applied. CCMD called. Patient transported in wheelchair. Patient discharge packet information provided with medication education. Patient questions and concerns were addressed.  Patient denies chest  Pain.  Patients belongings with patient.  Patient refused to wear his oxygen during transportation, education provided but patient refused.

## 2018-02-01 NOTE — Care Management Note (Signed)
Case Management Note  Patient Details  Name: Curtis Flores MRN: [id number] Date of Birth: May 29, 1955  Subjective/Objective:                    Action/Plan:  Patient to DC today with home oxygen. Referral placed to [facility]. Patient aware it may take a few hours to be delivered to his room, he understands. Patient states that his son will be able to provide transport home.   Expected Discharge Date:  02/01/18               Expected Discharge Plan:     In-House Referral:     Discharge planning Services  CM Consult  Post Acute Care Choice:  Durable Medical Equipment Choice offered to:     DME Arranged:  Oxygen DME Agency:  [place]:    [facility] Agency:     Status of Service:  Completed, signed off  If discussed at [place] of Stay Meetings, dates discussed:    Additional Comments:  Carles Collet, RN 02/01/2018, 12:34 PM

## 2018-02-01 NOTE — Progress Notes (Addendum)
Oxygen saturations while ambulation completed and documented.  CM aware and MD notified.

## 2018-02-03 ENCOUNTER — Telehealth: Payer: Self-pay | Admitting: *Deleted

## 2018-02-03 LAB — CULTURE, RESPIRATORY W GRAM STAIN: Culture: NORMAL

## 2018-02-03 NOTE — Telephone Encounter (Signed)
Attempted to contact the patient and left message for patient to call the office.  

## 2018-02-03 NOTE — Telephone Encounter (Signed)
Patient should hold off Orencia and Imuran until he gets clearance from Dr. Elsworth Soho or Dr. Edrick Oh to restart.

## 2018-02-03 NOTE — Telephone Encounter (Signed)
Patient was recently in the hospital with sepsis due to pneumonia. patient was discharged home 02/01/18 on azithromycin and Vantin. We have patient on Orencia weekly as well as Prednisone 10 mg daily and Imuran 100 mg daily.

## 2018-02-04 ENCOUNTER — Telehealth: Payer: Self-pay | Admitting: Rheumatology

## 2018-02-04 ENCOUNTER — Ambulatory Visit (HOSPITAL_COMMUNITY): Payer: Medicare Other | Admitting: Internal Medicine

## 2018-02-04 NOTE — Telephone Encounter (Signed)
Patient left a voicemail stating he was returning your call. °

## 2018-02-04 NOTE — Telephone Encounter (Signed)
Patient's wife [name] contacted the office. Advised to have patient hold Orencia and Imuran until he is cleared by Dr. Elsworth Soho and Dr. Edrick Oh.

## 2018-02-04 NOTE — Telephone Encounter (Signed)
See previous phone note.  

## 2018-02-05 LAB — CULTURE, BLOOD (ROUTINE X 2)
Culture: NO GROWTH
Culture: NO GROWTH
Special Requests: ADEQUATE

## 2018-02-09 ENCOUNTER — Telehealth: Payer: Self-pay | Admitting: Rheumatology

## 2018-02-09 LAB — HIV ANTIBODY (ROUTINE TESTING W REFLEX): HIV Screen 4th Generation wRfx: NONREACTIVE

## 2018-02-09 MED ORDER — PREDNISONE 10 MG PO TABS: 10.0000 mg | ORAL_TABLET | Freq: Every day | ORAL | 0 refills | Status: DC

## 2018-02-09 NOTE — Telephone Encounter (Signed)
Spoke with Curtis Blare' wife. Patient has been by Dr. Edrick Oh today. Patient is still on one antibiotic. Patient is due to follow up with Dr. Elsworth Soho. Patient will need a chest x-ray. Patient advised to continue to hold Imuran and Orencia until seen and released by Dr. Elsworth Soho and Dr Edrick Oh. Prescription refill sent in for Prednisone.

## 2018-02-09 NOTE — Telephone Encounter (Signed)
Patient's wife [name] called stating she was returning your call.

## 2018-02-10 ENCOUNTER — Encounter: Payer: Self-pay | Admitting: Adult Health

## 2018-02-10 ENCOUNTER — Ambulatory Visit (INDEPENDENT_AMBULATORY_CARE_PROVIDER_SITE_OTHER)
Admission: RE | Admit: 2018-02-10 | Discharge: 2018-02-10 | Disposition: A | Discharge status: Home / Self Care | Payer: Medicare Other | Source: Ambulatory Visit | Attending: Adult Health | Admitting: Adult Health

## 2018-02-10 ENCOUNTER — Ambulatory Visit (INDEPENDENT_AMBULATORY_CARE_PROVIDER_SITE_OTHER): Payer: Medicare Other | Admitting: Adult Health

## 2018-02-10 VITALS — BP 108/50 | HR 65 | Temp 98.3°F | Ht 67.0 in | Wt 194.4 lb

## 2018-02-10 DIAGNOSIS — J181 Lobar pneumonia, unspecified organism: Secondary | ICD-10-CM | POA: Diagnosis not present

## 2018-02-10 DIAGNOSIS — L4050 Arthropathic psoriasis, unspecified: Secondary | ICD-10-CM | POA: Diagnosis not present

## 2018-02-10 DIAGNOSIS — Z9861 Coronary angioplasty status: Secondary | ICD-10-CM | POA: Diagnosis not present

## 2018-02-10 DIAGNOSIS — J8410 Pulmonary fibrosis, unspecified: Secondary | ICD-10-CM

## 2018-02-10 DIAGNOSIS — I2510 Atherosclerotic heart disease of native coronary artery without angina pectoris: Secondary | ICD-10-CM

## 2018-02-10 DIAGNOSIS — J9601 Acute respiratory failure with hypoxia: Secondary | ICD-10-CM

## 2018-02-10 LAB — CALR + JAK2 E12-15 + MPL (REFLEXED)

## 2018-02-10 LAB — JAK2 V617F, W REFLEX TO CALR/E12/MPL

## 2018-02-10 NOTE — Progress Notes (Signed)
'@Patient'  ID: Curtis Flores, male    DOB: 03-Aug-1955, 62 y.o.   MRN: [id number]  Chief Complaint  Patient presents with  . 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  Seen by Dr Lamonte Sakai for persistent leucocytosis, bone marrow biopsy neg  A Flutter -s/p  ablation and amiodarone wasstopped.     Significant tests/ events 2012 -presented with a symmetric polyarthritis of elbows, wrists, hands, neck, shoulders &feet  CXR at [facility] showed bibasal interstitial fibrosis with hilar lymphadenopathy With POS RA 89 & CCP 122, ANA neg, HLA B 27 neg, CK 87  PPD neg, hep panel neg  HRCT - extensive regions of ground-glass attenuation and most pronounced in the lower lobes of the lungs bilaterally. These areas demonstrate internal interstitial prominence with a large amount of microcystic honeycombingwith some cylindrical bronchiectasis and bronchiolectasis. moderate hiatal hernia was also noted.   PFTs 2013 showed intraprenchymal restricton with FVC around 71% &DLCO 56% c/w ILD, no airway obstruction.  Rpt PFTs 10/2012 show mild drop in FVC to 66%, DLCO preserved   PFT 05/2015 FEV1 70%, ratio 82, FVC 66%, no BD response, DLCO 46%. DLCO has decreased from 2014.  CT chest 05/12/15 showed ILD/NSIP , slight progression compared from 2013.   PFT 12/2015 >> FVC 71%, DLCO 45% unc   02/10/2018 Follow up : Pneumonia , Sepsis -Post hospital follow up  Patient returns for a one-week post hospitalization visit.  Patient was recently admitted acute respiratory symptoms with cough fever and hypoxemia.  Found to have pneumonia with sepsis in the setting of immunosuppression.  He was treated with aggressive IV antibiotics and discharged on azithromycin  and Vantin.  Does note that he missed 1 to 2 days of antibiotics but is finishing the entire course currently . chest x-ray showed a right supra hilar and basilar pneumonia.  On admission lactic acid was normal.  BNP was elevated at 461.  He was treated with a steroid burst at discharge.  Patient is on chronic steroids with his underlying psoriatic and rheumatoid arthritis.  He is followed by rheumatology and is on Orencia, Imuran and prednisone 10 mg .  Since discharge patient says he is feeling better.  He has decreased cough congestion.  Energy level is still low but is starting to pick back up.  Shortness of breath is decreased but he still gets winded with activity.  Patient was discharged on 2 L of oxygen.  Says he is not felt that he needed this his last few days.  In the office today O2 saturations 93% on room air walking.  96% on room air at rest.   Last seen in office 08/2016 .  Says up until recently he was doing well.  Doing some light yard work.  Got winded with heavy activity.  No flare of recent cough.    No Known Allergies  Immunization History  Administered Date(s) Administered  . Influenza Split 12/22/2010  . Influenza Whole 01/18/2010  . Influenza, High Dose Seasonal PF 11/11/2017  . Influenza-Unspecified 12/03/2014, 12/13/2015  . Pneumococcal Conjugate-13 08/20/2016  . Pneumococcal Polysaccharide-23 01/22/2011    Past Medical History:  Diagnosis Date  . Abnormal CT scan, stomach   . Chronic systolic dysfunction of left ventricle   .  Coronary artery disease    lateral STEMI 02/22/15  . GERD (gastroesophageal reflux disease)   . Hyperlipidemia   . Hypertension   . Ischemic cardiomyopathy   . LBBB (left bundle branch block)   . Leukocytosis    followed by hematology, reactive  . Lymphadenopathy   . Myocardial infarction ([place]) 02/2015  . Psoriasis 2003  . Psoriatic arthritis ([place])   . Pulmonary fibrosis ([place])   . Rheumatoid arthritis(714.0) 2012  . Typical atrial  flutter (HCC)     Tobacco History: Social History   Tobacco Use  Smoking Status Former Smoker  . Packs/day: 1.00  . Years: 30.00  . Pack years: 30.00  . Types: Cigarettes  . Last attempt to quit: 03/21/2003  . Years since quitting: 14.9  Smokeless Tobacco Never Used   Counseling given: Not Answered   Outpatient Medications Prior to Visit  Medication Sig Dispense Refill  . Abatacept (ORENCIA CLICKJECT) 409 MG/ML SOAJ Inject 125 mg into the skin once a week. ask rheumatologist about restarting after pneumonia (Patient not taking: Reported on 02/10/2018) 12 Syringe 0  . atorvastatin (LIPITOR) 80 MG tablet TAKE 1 TABLET (80 MG TOTAL) BY MOUTH DAILY AT 6 PM. 90 tablet 1  . azaTHIOprine (IMURAN) 50 MG tablet TAKE 1 TABLET BY MOUTH TWICE A DAY (Patient not taking: No sig reported) 180 tablet 0  . azithromycin (ZITHROMAX) 250 MG tablet Take 1 tablet (250 mg total) by mouth daily. 3 each 0  . carvedilol (COREG) 12.5 MG tablet TAKE 1 AND 1/2 TABLETS BY MOUTH TWICE DAILY (Patient taking differently: Take 18.75 mg by mouth 2 (two) times daily with a meal. ) 270 tablet 3  . Clobetasol Prop Emollient Base (CLOBETASOL PROPIONATE E) 0.05 % emollient cream Apply to affected area BID (Patient taking differently: Apply topically 2 (two) times daily. ) 30 g 2  . clopidogrel (PLAVIX) 75 MG tablet Take 1 tablet (75 mg total) by mouth daily. 90 tablet 2  . furosemide (LASIX) 40 MG tablet TAKE 1 TABLET BY MOUTH TWICE A DAY (Patient taking differently: Take 40 mg by mouth 2 (two) times daily. ) 180 tablet 1  . losartan (COZAAR) 100 MG tablet TAKE 1 TABLET BY MOUTH EVERY DAY (Patient taking differently: Take 100 mg by mouth daily. ) 90 tablet 1  . pantoprazole (PROTONIX) 40 MG tablet Take 1 tablet (40 mg total) by mouth 2 (two) times daily before a meal. 180 tablet 2  . predniSONE (DELTASONE) 10 MG tablet Take 1 tablet (10 mg total) by mouth daily. 30 tablet 0   No facility-administered medications prior to  visit.      Review of Systems  Constitutional:   No  weight loss, night sweats,  Fevers, chills, + fatigue, or  lassitude.  HEENT:   No headaches,  Difficulty swallowing,  Tooth/dental problems, or  Sore throat,                No sneezing, itching, ear ache,  +nasal congestion, post nasal drip,   CV:  No chest pain,  Orthopnea, PND, swelling in lower extremities, anasarca, dizziness, palpitations, syncope.   GI  No heartburn, indigestion, abdominal pain, nausea, vomiting, diarrhea, change in bowel habits, loss of appetite, bloody stools.   Resp: No excess mucus, no productive cough,  No non-productive cough,  No coughing up of blood.  No change in color of mucus.  No wheezing.  No chest wall deformity  Skin: no rash or lesions.  GU: no dysuria, change  in color of urine, no urgency or frequency.  No flank pain, no hematuria   MS:  No joint pain or swelling.  No decreased range of motion.  No back pain.    Physical Exam  BP (!) 108/50 (BP Location: Left Arm, Patient Position: Sitting, Cuff Size: Normal)   Pulse 65   Temp 98.3 F (36.8 C)   Ht '5\' 7"'  (1.702 m)   Wt 194 lb 6.4 oz (88.2 kg)   SpO2 93%   BMI 30.45 kg/m   GEN: A/Ox3; pleasant , NAD elderly   HEENT:  [place]/AT,  EACs-clear, TMs-wnl, NOSE-clear, THROAT-clear, no lesions, no postnasal drip or exudate noted.   NECK:  Supple w/ fair ROM; no JVD; normal carotid impulses w/o bruits; no thyromegaly or nodules palpated; no lymphadenopathy.    RESP bibasilar crackles  no accessory muscle use, no dullness to percussion  CARD:  RRR, no m/r/g, no peripheral edema, pulses intact, no cyanosis or clubbing.  GI:   Soft & nt; nml bowel sounds; no organomegaly or masses detected.   Musco: Warm bil, no deformities or joint swelling noted.   Neuro: alert, no focal deficits noted.    Skin: Warm, no lesions or rashes    Lab Results:  CBC  BMET  No results found for: PROBNP  Imaging: Dg Chest 2 View  Result Date:  01/31/2018 CLINICAL DATA:  Shortness of breath. Cough. Weakness. EXAM: CHEST - 2 VIEW COMPARISON:  Radiograph 08/20/2016 chest CT 05/12/2015 FINDINGS: Borderline cardiomegaly with aortic atherosclerosis. Patient with known interstitial lung disease with peripheral reticulation in a basilar predominant pattern. Increased right perihilar opacities with slight increased density at the bases since prior radiograph. No large pleural effusion or pneumothorax. Unchanged osseous structures. IMPRESSION: Known interstitial lung disease. Increased right suprahilar opacity and increased bibasilar density in the region of chronic lung disease may represent progression of chronic disease versus superimposed pneumonia or atelectasis. Electronically Signed   By: Keith Rake M.D.   On: 01/31/2018 00:43      PFT Results Latest Ref Rng & Units 12/13/2015 05/22/2015  FVC-Pre L 2.41 2.42  FVC-Predicted Pre % 68 68  FVC-Post L 2.53 2.34  FVC-Predicted Post % 71 66  Pre FEV1/FVC % % 77 80  Post FEV1/FCV % % 82 82  FEV1-Pre L 1.85 1.94  FEV1-Predicted Pre % 68 70  FEV1-Post L 2.07 1.93  DLCO UNC% % 45 46  DLCO COR %Predicted % 92 101  TLC L 4.05 3.55  TLC % Predicted % 63 55  RV % Predicted % 73 60    No results found for: NITRICOXIDE      Assessment & Plan:   Lobar pneumonia, unspecified organism (HCC) CAP with sepsis (immunosuppressed pt ) -improved with antibiotics.  She is clinically improving.  He is to finish up his full course of antibiotics.  Check xray today   Plan  Patient Instructions  Edrick Oh as directed.  Chest xray today  May use Oxygen with activity as needed.  Follow up with Dr. Elsworth Soho  In 4 weeks with PFT and As needed   Please contact office for sooner follow up if symptoms do not improve or worsen or seek emergency care       Acute respiratory failure with hypoxia ([place]) I improved in the setting of recent pneumonia and sepsis.  O2 saturations are adequate today in  the office at rest and walking.  He may use his oxygen at home as needed.  On return  we will reevaluate if still O2 saturations greater than 90% will be able to discontinue his oxygen.  Psoriatic arthritis (HCC) Psoriatic and rheumatoid arthritis overlap.  Patient is continue to follow-up with rheumatology as planned next month.  Currently arthritic medications are on hold due to recent acute illness.  Patient is continue on hold until seen by rheumatology.  Is clinically improving.  Will check chest x-ray today.  As long as we are seeing clinical and radiographical improvement should be able to restart medications in the next few weeks.  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  Appears to be in stable up until this acute illness.  Once cleared from pneumonia will be able to restart his rheumatoid and psoriatic medications. Check PFTs with DLCO on return.     Rexene Edison, NP 02/10/2018

## 2018-02-10 NOTE — Assessment & Plan Note (Addendum)
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  Appears to be in stable up until this acute illness.  Once cleared from pneumonia will be able to restart his rheumatoid and psoriatic medications. Check PFTs with DLCO on return.

## 2018-02-10 NOTE — Patient Instructions (Addendum)
[employer] as directed.  Chest xray today  May use Oxygen with activity as needed.  Follow up with Dr. Elsworth Soho  In 4 weeks with PFT and As needed   Please contact office for sooner follow up if symptoms do not improve or worsen or seek emergency care

## 2018-02-10 NOTE — Assessment & Plan Note (Signed)
CAP with sepsis (immunosuppressed pt ) -improved with antibiotics.  She is clinically improving.  He is to finish up his full course of antibiotics.  Check xray today   Plan  Patient Instructions  Edrick Oh as directed.  Chest xray today  May use Oxygen with activity as needed.  Follow up with Dr. Elsworth Soho  In 4 weeks with PFT and As needed   Please contact office for sooner follow up if symptoms do not improve or worsen or seek emergency care

## 2018-02-10 NOTE — Assessment & Plan Note (Signed)
I improved in the setting of recent pneumonia and sepsis.  O2 saturations are adequate today in the office at rest and walking.  He may use his oxygen at home as needed.  On return we will reevaluate if still O2 saturations greater than 90% will be able to discontinue his oxygen.

## 2018-02-10 NOTE — Assessment & Plan Note (Signed)
Psoriatic and rheumatoid arthritis overlap.  Patient is continue to follow-up with rheumatology as planned next month.  Currently arthritic medications are on hold due to recent acute illness.  Patient is continue on hold until seen by rheumatology.  Is clinically improving.  Will check chest x-ray today.  As long as we are seeing clinical and radiographical improvement should be able to restart medications in the next few weeks.

## 2018-02-11 ENCOUNTER — Telehealth: Payer: Self-pay | Admitting: Rheumatology

## 2018-02-11 NOTE — Telephone Encounter (Signed)
Seen by TP.  What I understand from the note is that he appears to be clinically improved Chest x-ray has cleared -so okay to restart his Imuran and Orencia He has baseline leukocytosis so difficult to follow this but would repeat CBC to see if this has settled back down to < 20 range

## 2018-02-11 NOTE — Telephone Encounter (Signed)
Dear Dr. Elsworth Soho, We have been holding [name] and Curtis Flores due to recent hospitalization and sepsis.  Please give [place] a clearance to restart medication when his infection is completely cleared. Thank you Bo Merino, MD

## 2018-02-11 NOTE — Progress Notes (Signed)
Called spoke with patient's spouse.  Advised of cxr results / recs as stated by TP.  Spouse verbalized understanding and denied any questions.

## 2018-02-11 NOTE — Telephone Encounter (Signed)
I will wait till CBC is trending down. Thank you for your quick response.

## 2018-02-17 ENCOUNTER — Inpatient Hospital Stay (HOSPITAL_COMMUNITY): Payer: Medicare Other | Attending: Internal Medicine | Admitting: Internal Medicine

## 2018-02-17 ENCOUNTER — Encounter (HOSPITAL_COMMUNITY): Payer: Self-pay | Admitting: Internal Medicine

## 2018-02-17 VITALS — BP 122/57 | HR 48 | Temp 98.6°F | Resp 18 | Wt 193.9 lb

## 2018-02-17 DIAGNOSIS — I10 Essential (primary) hypertension: Secondary | ICD-10-CM | POA: Diagnosis not present

## 2018-02-17 DIAGNOSIS — Z87891 Personal history of nicotine dependence: Secondary | ICD-10-CM

## 2018-02-17 DIAGNOSIS — M069 Rheumatoid arthritis, unspecified: Secondary | ICD-10-CM | POA: Diagnosis not present

## 2018-02-17 DIAGNOSIS — D638 Anemia in other chronic diseases classified elsewhere: Secondary | ICD-10-CM

## 2018-02-17 DIAGNOSIS — I2510 Atherosclerotic heart disease of native coronary artery without angina pectoris: Secondary | ICD-10-CM | POA: Insufficient documentation

## 2018-02-17 DIAGNOSIS — J8410 Pulmonary fibrosis, unspecified: Secondary | ICD-10-CM | POA: Insufficient documentation

## 2018-02-17 DIAGNOSIS — Z79899 Other long term (current) drug therapy: Secondary | ICD-10-CM | POA: Diagnosis not present

## 2018-02-17 DIAGNOSIS — D72823 Leukemoid reaction: Secondary | ICD-10-CM | POA: Diagnosis present

## 2018-02-17 NOTE — Progress Notes (Signed)
Diagnosis Leukemoid reaction - Plan: CBC with Differential/Platelet, Comprehensive metabolic panel, Lactate dehydrogenase  Staging Cancer Staging No matching staging information was found for the patient.  Assessment and Plan:  1.  Leukocytosis.  Pt was previously followed by Dr. Talbert Cage. He had a Negative workup in 2013, including negative bone marrow biopsy. Peripheral flow cytometry 2016 negative for monoclonal B cell population.  He is on chronic prednisone and leukocytosis is felt to be reactive likely due to steroid use.   Pt had negative BCR/ABL.  Jak 2 negative.  Pt will RTC in 08/2018 for follow-up and repeat labs. WBC in 01/2018 stable at 20,000 compared to labs done 07/2017 when WBC was 18.4.    2.  Rheumatoid arthritis/Psoriatic arthritis.  Pt remains on prednisone.  Follow-up with PCP or rheumatologist as directed.    3.  Anemia of chronic disease.  Labs done 01/2018 reviewed and showed WBC 20.6  HB 14.1 plts 252,000.  Labs done  07/23/2017 show WBC 18.4, HB 14.5 plts 216,000.  Will repeat labs in 08/2018.    4.  Hypertension:  BP 122/57.  Follow-up with PCP for monitoring.    5.  CAD. This was noted on scans done in 2017.  He reports prior heart attack and is followed by cardiology.    6.  Pulmonary fibrosis.  Follow-up with pulmonary as directed.    Current Status:  Pt is seen today for follow-up.  He is here to go over labs.  He remains on Prednisone.    Interpretation [facility]):  Comment   Comments: (NOTE)  The quantitative RT-PCR assay is negative for the b2a2 and b3a2  (p210) and e1a2 (p190) fusion gene transcripts found in chronic  myelogenous leukemia and Philadelphia positive acute lymphocytic  leukemia. These results do not rule out the presence of low levels of  BCR-ABL1 transcript below the level of detection of this assay, or  the presence of rare BCR-ABL1 transcripts not detected by this assay.        Pathologist smear review  Status: Finalresult Visible  to patient:  Not Released Nextappt: 05/09/2015 at 12:20 PM in Oncology ([facility]) Dx:  Leukocytosis         60moago    Path Review Reviewed By BViolet Baldy M.D.   Comments: 08.08.16  NORMOCYTIC ANEMIA,  MILD LEUKOCYTOSIS.  [facility]formed at [facility]      Specimen Collected: 10/07/14 1:26 PM Last Resulted: 10/10/14 10:31 AM           PATHOLOGY:    04/09/2014  Diagnosis Bone Marrow, Aspirate,Biopsy, and Clot, left posterior iliac crest - NORMOCELLULAR BONE MARROW FOR AGE WITH TRILINEAGE HEMATOPOIESIS. - A FEW SMALL LYMPHOID AGGREGATES PRESENT. - INCREASED IRON STORES. RPRETATION Interpretation Peripheral Blood Flow Cytometry - PREDOMINANCE OF T LYMPHOCYTES WITH NO ABERRANT PHENOTYPE. - MINOR B-CELL POPULATION IDENTIFIED. Diagnosis Comment: The majority of lymphocytes consists of T cells with no aberrant phenotype. B cells represent a minor population (less than 10% of lymphocytes) with extremely dim staining for surface immunoglobulin light chains that hinder assessment and/or quantitation of clonality. However, an abnormal B-cell phenotype such as expression of CD10 or CD5 is not identified. Clinical correlation is recommended. (BNS:ecj 10/11/2014) BSusanne GreenhouseMD Pathologist, Electronic Signature (Case signed 10/11/2014)  Problem List Patient Active Problem List   Diagnosis Date Noted  . Lobar pneumonia, unspecified organism ([place] [J18.1] 01/31/2018  . Sepsis ([place] [A41.9] 01/31/2018  . Acute respiratory failure with  hypoxia ([place]) [J96.01] 01/31/2018  . CAD (coronary artery disease) [I25.10] 01/31/2018  . Elevated troponin [R79.89] 01/31/2018  . Sepsis due to pneumonia ([place]) [J18.9, A41.9]   . Ischemic cardiomyopathy [I25.5] 12/04/2016  . Leukocytosis [D72.829] 07/17/2016  . Pain in joint involving multiple sites [M25.50] 04/25/2016  . Psoriatic arthritis ([place]) [L40.50] 02/14/2016  . High risk  medication use [Z79.899] 02/14/2016  . NICM (nonischemic cardiomyopathy) ([place]) [I42.8] 08/29/2015  . Essential hypertension [I10] 08/29/2015  . Abnormal CT scan, stomach [R93.3]   . Hiatal hernia [K44.9]   . GERD (gastroesophageal reflux disease) [K21.9] 08/01/2015  . Weight loss, unintentional [R63.4] 08/01/2015  . Iron deficiency anemia [D50.9] 06/06/2015  . Heme positive stool [R19.5] 06/06/2015  . PAF (paroxysmal atrial fibrillation) ([place]) [I48.0] 05/30/2015  . CAD S/P percutaneous coronary angioplasty [I25.10, Z98.61] 04/07/2015  . Atrial flutter ([place]) [I48.92] 04/07/2015  . 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 Acute on chronic systolic congestive heart failure ([place]) [I50.23] 04/07/2015  . LBBB (left bundle branch block) [I44.7] 04/07/2015  . Wide-complex tachycardia ([place]) [I47.2] 04/07/2015  . Hyperlipidemia [E78.5] 03/07/2015  . Hypertension [I10]   . Psoriasis [L40.9]   . Rheumatoid arthritis ([place]) [M06.9]   . Pulmonary fibrosis [facility]) [J84.10] 03/21/2011    Past Medical History Past Medical History:  Diagnosis Date  . Abnormal CT scan, stomach   . Chronic systolic dysfunction of left ventricle   . Coronary artery disease    lateral STEMI 02/22/15  . GERD (gastroesophageal reflux disease)   . Hyperlipidemia   . Hypertension   . Ischemic cardiomyopathy   . LBBB (left bundle branch block)   . Leukocytosis    followed by hematology, reactive  . Lymphadenopathy   . Myocardial infarction ([place]) 02/2015  . Psoriasis 2003  . Psoriatic arthritis ([place])   . Pulmonary fibrosis ([place])   . Rheumatoid arthritis(714.0) 2012  . Typical atrial flutter [facility])     Past Surgical History Past Surgical History:  Procedure Laterality Date  . CARDIAC CATHETERIZATION N/A 02/22/2015   Procedure: Left Heart Cath and Coronary Angiography;  Surgeon: Lorretta Harp, MD;  Location: [place] [facility];  Service: Cardiovascular;  Laterality: N/A;  . CARDIAC  CATHETERIZATION N/A 02/22/2015   Procedure: Coronary Stent Intervention;  Surgeon: Lorretta Harp, MD;  Location: [place] [facility];  Service: Cardiovascular;  Laterality: N/A;  . CARDIOVERSION N/A 04/12/2015   Procedure: CARDIOVERSION;  Surgeon: Larey Dresser, MD;  Location: [facility];  Service: Cardiovascular;  Laterality: N/A;  . COLONOSCOPY  07/01/2003   NTZ:GYFVCB colonic mucosa except for the proximal right colon in the area of ICV which was not seen completely due to inadequate bowel prep. followed with ACBE which was normal.   . COLONOSCOPY N/A 08/24/2015   Dr. Gala Romney: Normal colon. Next colonoscopy in 10 years.  [name] ELECTROPHYSIOLOGIC STUDY N/A 05/30/2015   Atrial fibrillation ablation by Dr Rayann Heman  . ESOPHAGOGASTRODUODENOSCOPY N/A 08/24/2015   Dr. Gala Romney: Medium-sized hiatal hernia, erosive gastropathy. Cameron lesions. Esophageal mucosa distally suggestive of short segment Barrett's esophagus. Not confirmed on biopsy. Gastric biopsy with minimal chronic inflammation  . GIVENS CAPSULE STUDY N/A 04/17/2016   Procedure: GIVENS CAPSULE STUDY;  Surgeon: Daneil Dolin, MD;  [facility];  Service: Endoscopy;  Laterality: N/A;  Pt to arrive at 8:00 am for 8:30 am appt  . TEE WITHOUT CARDIOVERSION N/A 04/12/2015   Procedure: TRANSESOPHAGEAL ECHOCARDIOGRAM (TEE);  Surgeon: Larey Dresser, MD;  Location: [place];  Service:  Cardiovascular;  Laterality: N/A;    Family History Family History  Problem Relation Age of Onset  . Hypertension Mother   . Colon cancer Neg Hx      Social History  reports that he quit smoking about 14 years ago. His smoking use included cigarettes. He has a 30.00 pack-year smoking history. He has never used smokeless tobacco. He reports that he does not drink alcohol or use drugs.  Medications  Current Outpatient Medications:  .  Abatacept (ORENCIA CLICKJECT) 099 MG/ML SOAJ, Inject 125 mg into the skin once a week. ask rheumatologist about restarting  after pneumonia, Disp: 12 Syringe, Rfl: 0 .  atorvastatin (LIPITOR) 80 MG tablet, TAKE 1 TABLET (80 MG TOTAL) BY MOUTH DAILY AT 6 PM., Disp: 90 tablet, Rfl: 1 .  azithromycin (ZITHROMAX) 250 MG tablet, Take 1 tablet (250 mg total) by mouth daily., Disp: 3 each, Rfl: 0 .  carvedilol (COREG) 12.5 MG tablet, TAKE 1 AND 1/2 TABLETS BY MOUTH TWICE DAILY (Patient taking differently: Take 18.75 mg by mouth 2 (two) times daily with a meal. ), Disp: 270 tablet, Rfl: 3 .  Clobetasol Prop Emollient Base (CLOBETASOL PROPIONATE E) 0.05 % emollient cream, Apply to affected area BID (Patient taking differently: Apply topically 2 (two) times daily. ), Disp: 30 g, Rfl: 2 .  clopidogrel (PLAVIX) 75 MG tablet, Take 1 tablet (75 mg total) by mouth daily., Disp: 90 tablet, Rfl: 2 .  furosemide (LASIX) 40 MG tablet, TAKE 1 TABLET BY MOUTH TWICE A DAY (Patient taking differently: Take 40 mg by mouth 2 (two) times daily. ), Disp: 180 tablet, Rfl: 1 .  losartan (COZAAR) 100 MG tablet, TAKE 1 TABLET BY MOUTH EVERY DAY (Patient taking differently: Take 100 mg by mouth daily. ), Disp: 90 tablet, Rfl: 1 .  pantoprazole (PROTONIX) 40 MG tablet, Take 1 tablet (40 mg total) by mouth 2 (two) times daily before a meal., Disp: 180 tablet, Rfl: 2 .  predniSONE (DELTASONE) 10 MG tablet, Take 1 tablet (10 mg total) by mouth daily., Disp: 30 tablet, Rfl: 0 .  azaTHIOprine (IMURAN) 50 MG tablet, TAKE 1 TABLET BY MOUTH TWICE A DAY (Patient not taking: No sig reported), Disp: 180 tablet, Rfl: 0  Allergies Patient has no known allergies.  Review of Systems Review of Systems - Oncology ROS negative   Physical Exam  Vitals Wt Readings from Last 3 Encounters:  02/17/18 193 lb 14.4 oz (88 kg)  02/10/18 194 lb 6.4 oz (88.2 kg)  02/01/18 193 lb 6.4 oz (87.7 kg)   Temp Readings from Last 3 Encounters:  02/17/18 98.6 F (37 C)  02/10/18 98.3 F (36.8 C)  02/01/18 (!) 97.4 F (36.3 C) (Oral)   BP Readings from Last 3 Encounters:   02/17/18 (!) 122/57  02/10/18 (!) 108/50  02/01/18 (!) 177/82   Pulse Readings from Last 3 Encounters:  02/17/18 (!) 48  02/10/18 65  02/01/18 83   Constitutional: Well-developed, well-nourished, and in no distress.   HENT: Head: Normocephalic and atraumatic.  Mouth/Throat: No oropharyngeal exudate. Mucosa moist. Eyes: Pupils are equal, round, and reactive to light. Conjunctivae are normal. No scleral icterus.  Neck: Normal range of motion. Neck supple. No JVD present.  Cardiovascular: Normal rate, regular rhythm and normal heart sounds.  Exam reveals no gallop and no friction rub.   No murmur heard. Pulmonary/Chest: Effort normal and breath sounds normal. No respiratory distress. No wheezes.No rales.  Abdominal: Soft. Bowel sounds are normal. No distension. There is  no tenderness. There is no guarding.  Musculoskeletal: No edema or tenderness.  Lymphadenopathy: No cervical, axillary or supraclavicular adenopathy.  Neurological: Alert and oriented to person, place, and time. No cranial nerve deficit.  Skin: Skin is warm and dry. No rash noted. No erythema. No pallor.  Psychiatric: Affect and judgment normal.   Labs No visits with results within 3 Day(s) from this visit.  Latest known visit with results is:  Admission on 01/30/2018, Discharged on 02/01/2018  Component Date Value Ref Range Status  . Lactic Acid, Venous 01/30/2018 1.62  0.5 - 1.9 mmol/L Final  . Sodium 01/30/2018 135  135 - 145 mmol/L Final  . Potassium 01/30/2018 3.6  3.5 - 5.1 mmol/L Final  . Chloride 01/30/2018 97* 98 - 111 mmol/L Final  . CO2 01/30/2018 25  22 - 32 mmol/L Final  . Glucose, Bld 01/30/2018 80  70 - 99 mg/dL Final  . BUN 01/30/2018 16  8 - 23 mg/dL Final  . Creatinine, Ser 01/30/2018 1.03  0.61 - 1.24 mg/dL Final  . Calcium 01/30/2018 9.0  8.9 - 10.3 mg/dL Final  . Total Protein 01/30/2018 6.8  6.5 - 8.1 g/dL Final  . Albumin 01/30/2018 2.9* 3.5 - 5.0 g/dL Final  . AST 01/30/2018 38  15 - 41  U/L Final  . ALT 01/30/2018 29  0 - 44 U/L Final  . Alkaline Phosphatase 01/30/2018 73  38 - 126 U/L Final  . Total Bilirubin 01/30/2018 1.5* 0.3 - 1.2 mg/dL Final  . GFR calc non Af Amer 01/30/2018 >60  >60 mL/min Final  . GFR calc Af Amer 01/30/2018 >60  >60 mL/min Final  . Anion gap 01/30/2018 13  5 - 15 Final   Performed at [facility], [place] [address], [place], [place] [address]  . WBC 01/30/2018 26.7* 4.0 - 10.5 K/uL Final  . RBC 01/30/2018 4.95  4.22 - 5.81 MIL/uL Final  . Hemoglobin 01/30/2018 13.2  13.0 - 17.0 g/dL Final  . HCT 01/30/2018 42.8  39.0 - 52.0 % Final  . MCV 01/30/2018 86.5  80.0 - 100.0 fL Final  . MCH 01/30/2018 26.7  26.0 - 34.0 pg Final  . MCHC 01/30/2018 30.8  30.0 - 36.0 g/dL Final  . RDW 01/30/2018 14.3  11.5 - 15.5 % Final  . Platelets 01/30/2018 193  150 - 400 K/uL Final  . nRBC 01/30/2018 0.0  0.0 - 0.2 % Final  . Neutrophils Relative % 01/30/2018 80  % Final  . Neutro Abs 01/30/2018 21.3* 1.7 - 7.7 K/uL Final  . Lymphocytes Relative 01/30/2018 11  % Final  . Lymphs Abs 01/30/2018 3.0  0.7 - 4.0 K/uL Final  . Monocytes Relative 01/30/2018 7  % Final  . Monocytes Absolute 01/30/2018 1.9* 0.1 - 1.0 K/uL Final  . Eosinophils Relative 01/30/2018 0  % Final  . Eosinophils Absolute 01/30/2018 0.0  0.0 - 0.5 K/uL Final  . Basophils Relative 01/30/2018 0  % Final  . Basophils Absolute 01/30/2018 0.1  0.0 - 0.1 K/uL Final  . WBC Morphology 01/30/2018 MILD LEFT SHIFT (1-5% METAS, OCC MYELO, OCC BANDS)   Final   Comment: SMUDGE CELLS TOXIC GRANULATION VACUOLATED NEUTROPHILS   . Immature Granulocytes 01/30/2018 2  % Final  . Abs Immature Granulocytes 01/30/2018 0.40* 0.00 - 0.07 K/uL Final  . Smudge Cells 01/30/2018 PRESENT   Final  . Tear Drop Cells 01/30/2018 PRESENT   Final  . Polychromasia 01/30/2018 PRESENT   Final   Performed  at [facility], [place] [address], [place], [place] [address]  . Specimen Description 01/30/2018 BLOOD LEFT  ANTECUBITAL   Final  . Special Requests 01/30/2018 BOTTLES DRAWN AEROBIC AND ANAEROBIC Blood Culture adequate volume   Final  . Culture 01/30/2018    Final                   Value:NO GROWTH 5 DAYS Performed at [facility], [place] [address] [place], [place] [address]   . Report Status 01/30/2018 02/05/2018 FINAL   Final  . Specimen Description 01/30/2018 BLOOD RIGHT HAND   Final  . Special Requests 01/30/2018 BOTTLES DRAWN AEROBIC AND ANAEROBIC Blood Culture results may not be optimal due to an inadequate volume of blood received in culture bottles   Final  . Culture 01/30/2018    Final                   Value:NO GROWTH 5 DAYS Performed at [facility], [place] [address], [place], [place] [address]   . Report Status 01/30/2018 02/05/2018 FINAL   Final  . B Natriuretic Peptide 01/30/2018 461.3* 0.0 - 100.0 pg/mL Final   Performed at [place] [address] [place], [place] [address]  . Troponin I 01/30/2018 0.13* <0.03 ng/mL Final   Comment: CRITICAL RESULT CALLED TO, READ BACK BY AND VERIFIED WITH: MCCORD,S RN 01/31/2018 0118 JORDANS Performed at [facility], [place] [address], [place], [place] [address]   . Lactic Acid, Venous 01/31/2018 0.99  0.5 - 1.9 mmol/L Final  . Influenza A By PCR 01/31/2018 NEGATIVE  NEGATIVE Final  . Influenza B By PCR 01/31/2018 NEGATIVE  NEGATIVE Final   Comment: (NOTE) The Xpert Xpress Flu assay is intended as an aid in the diagnosis of  influenza and should not be used as a sole basis for treatment.  This  assay is FDA approved for nasopharyngeal swab specimens only. Nasal  washings and aspirates are unacceptable for Xpert Xpress Flu testing. Performed at [facility], [place] [address], [place], [place] [address]   . Hgb A1c MFr Bld 01/31/2018 5.4  4.8 - 5.6 % Final   Comment: (NOTE) Pre diabetes:          5.7%-6.4% Diabetes:              >6.4% Glycemic control for   <7.0% adults with diabetes   . Mean Plasma Glucose  01/31/2018 108.28  mg/dL Final   Performed at [place] [address], [place], [place] [address]  . Cholesterol 01/31/2018 86  0 - 200 mg/dL Final  . Triglycerides 01/31/2018 51  <150 mg/dL Final  . HDL 01/31/2018 32* >40 mg/dL Final  . Total CHOL/HDL Ratio 01/31/2018 2.7  RATIO Final  . VLDL 01/31/2018 10  0 - 40 mg/dL Final  . LDL Cholesterol 01/31/2018 44  0 - 99 mg/dL Final   Comment:        Total Cholesterol/HDL:CHD Risk Coronary Heart Disease Risk Table                     Men   Women  1/2 Average Risk   3.4   3.3  Average Risk       5.0   4.4  2 X Average Risk   9.6   7.1  3 X Average Risk  23.4   11.0        Use the  calculated Patient Ratio above and the CHD Risk Table to determine the patient's CHD Risk.        ATP III CLASSIFICATION (LDL):  <100     mg/dL   Optimal  100-129  mg/dL   Near or Above                    Optimal  130-159  mg/dL   Borderline  160-189  mg/dL   High  >190     mg/dL   Very High Performed at [place] [address], [place], [place] [address]   . Troponin I 01/31/2018 0.14* <0.03 ng/mL Final   Comment: CRITICAL VALUE NOTED.  VALUE IS CONSISTENT WITH PREVIOUSLY REPORTED AND CALLED VALUE. Performed at [facility], [place] [address] [place], [place] [address]   . Troponin I 01/31/2018 0.09* <0.03 ng/mL Final   Comment: CRITICAL VALUE NOTED.  VALUE IS CONSISTENT WITH PREVIOUSLY REPORTED AND CALLED VALUE. Performed at [facility], [place] [address], [place], [place] [address]   . Lactic Acid, Venous 01/31/2018 1.3  0.5 - 1.9 mmol/L Final   Performed at [facility], [place] [address], [place], [place] [address]  . Procalcitonin 01/31/2018 4.76  ng/mL Final   Comment:        Interpretation: PCT > 2 ng/mL: Systemic infection (sepsis) is likely, unless other causes are known. (NOTE)       Sepsis PCT Algorithm           Lower Respiratory Tract                                      Infection PCT Algorithm     ----------------------------     ----------------------------         PCT < 0.25 ng/mL                PCT < 0.10 ng/mL         Strongly encourage             Strongly discourage   discontinuation of antibiotics    initiation of antibiotics    ----------------------------     -----------------------------       PCT 0.25 - 0.50 ng/mL            PCT 0.10 - 0.25 ng/mL               OR       >80% decrease in PCT            Discourage initiation of                                            antibiotics      Encourage discontinuation           of antibiotics    ----------------------------     -----------------------------         PCT >= 0.50 ng/mL              PCT 0.26 - 0.50 ng/mL               AND       <80% decrease in PCT  Encourage initiation of                                             antibiotics       Encourage continuation           of antibiotics    ----------------------------     -----------------------------        PCT >= 0.50 ng/mL                  PCT > 0.50 ng/mL               AND         increase in PCT                  Strongly encourage                                      initiation of antibiotics    Strongly encourage escalation           of antibiotics                                     -----------------------------                                           PCT <= 0.25 ng/mL                                                 OR                                        > 80% decrease in PCT                                     Discontinue / Do not initiate                                             antibiotics Performed at [facility], [address], [place], [place] [address]   . Specimen Description 02/01/2018 SPUTUM   Final  . Special Requests 02/01/2018 NONE   Final  . Sputum evaluation 02/01/2018    Final                   Value:THIS SPECIMEN IS ACCEPTABLE FOR SPUTUM CULTURE Performed at [facility],  [address], [place], [place] [address]   . Report Status 02/01/2018 02/01/2018 FINAL   Final  . HIV Screen 4th Generation wRfx 01/31/2018 Non Reactive  Non Reactive Final   Comment: (NOTE) A duplicate report has been generated due to demographic updates. Performed At: [facility] [address]  [address] [place], [place] [id number] Rush Farmer MD VP:[id number]   . Strep Pneumo Urinary Antigen 01/31/2018 NEGATIVE  NEGATIVE Final   Comment:        Infection due to S. pneumoniae cannot be absolutely ruled out since the antigen present may be below the detection limit of the test. Performed at [facility], [address], [place], [place] [address]   . L. pneumophila Serogp 1 Ur Ag 01/31/2018 Negative  Negative Final   Comment: (NOTE) Presumptive negative for L. pneumophila serogroup 1 antigen in urine, suggesting no recent or current infection. Legionnaires' disease cannot be ruled out since other serogroups and species may also cause disease. Performed At: [facility] [place], [place] [id number] Rush Farmer MD [id number]   . Source of Sample 01/31/2018 URINE, RANDOM   Final   Performed at [place] [address] [place], [place] [address]  . Color, Urine 01/31/2018 YELLOW  YELLOW Final  . APPearance 01/31/2018 CLEAR  CLEAR Final  . Specific Gravity, Urine 01/31/2018 1.005  1.005 - 1.030 Final  . pH 01/31/2018 6.0  5.0 - 8.0 Final  . Glucose, UA 01/31/2018 NEGATIVE  NEGATIVE mg/dL Final  . Hgb urine dipstick 01/31/2018 NEGATIVE  NEGATIVE Final  . Bilirubin Urine 01/31/2018 NEGATIVE  NEGATIVE Final  . Ketones, ur 01/31/2018 NEGATIVE  NEGATIVE mg/dL Final  . Protein, ur 01/31/2018 NEGATIVE  NEGATIVE mg/dL Final  . Nitrite 01/31/2018 NEGATIVE  NEGATIVE Final  . Leukocytes, UA 01/31/2018 NEGATIVE  NEGATIVE Final   Performed at [facility], [place] [address], [place], [place] [address]  . Opiates 01/31/2018 NONE DETECTED  NONE  DETECTED Final  . Cocaine 01/31/2018 NONE DETECTED  NONE DETECTED Final  . Benzodiazepines 01/31/2018 NONE DETECTED  NONE DETECTED Final  . Amphetamines 01/31/2018 NONE DETECTED  NONE DETECTED Final  . Tetrahydrocannabinol 01/31/2018 NONE DETECTED  NONE DETECTED Final  . Barbiturates 01/31/2018 NONE DETECTED  NONE DETECTED Final   Comment: (NOTE) DRUG SCREEN FOR MEDICAL PURPOSES ONLY.  IF CONFIRMATION IS NEEDED FOR ANY PURPOSE, NOTIFY LAB WITHIN 5 DAYS. LOWEST DETECTABLE LIMITS FOR URINE DRUG SCREEN Drug Class                     Cutoff (ng/mL) Amphetamine and metabolites    1000 Barbiturate and metabolites    200 Benzodiazepine                 984 Tricyclics and metabolites     300 Opiates and metabolites        300 Cocaine and metabolites        300 THC                            50 Performed at [facility], [place] [address] [place], [place] [address]   . MRSA by PCR 01/31/2018 NEGATIVE  NEGATIVE Final   Comment:        The GeneXpert MRSA Assay (FDA approved for NASAL specimens only), is one component of a comprehensive MRSA colonization surveillance program. It is not intended to diagnose MRSA infection nor to guide or monitor treatment for MRSA infections. Performed at [facility], [place] [address], [place], [place] [address]   . Specimen Description 02/01/2018 SPUTUM   Final  . Special Requests 02/01/2018 NONE Reflexed from [id number]   Final  . Gram Stain 02/01/2018    Final  Value:ABUNDANT WBC PRESENT, PREDOMINANTLY PMN RARE SQUAMOUS EPITHELIAL CELLS PRESENT FEW GRAM NEGATIVE RODS RARE GRAM POSITIVE COCCI   . Culture 02/01/2018    Final                   Value:FEW Consistent with normal respiratory flora. Performed at [facility], [place] [address] [place], [place] [address]   . Report Status 02/01/2018 02/03/2018 FINAL   Final     Pathology Orders Placed This Encounter  Procedures  . CBC with Differential/Platelet    Standing  Status:   Future    Standing Expiration Date:   02/18/2020  . Comprehensive metabolic panel    Standing Status:   Future    Standing Expiration Date:   02/18/2020  . Lactate dehydrogenase    Standing Status:   Future    Standing Expiration Date:   02/18/2020       Zoila Shutter MD

## 2018-02-17 NOTE — Patient Instructions (Signed)
[facility] at [facility] Discharge Instructions  You were seen by Dr. Higgs today   Thank you for choosing [facility] at [facility] to provide your oncology and hematology care.  To afford each patient quality time with our provider, please arrive at least 15 minutes before your scheduled appointment time.   If you have a lab appointment with the Cancer Center please come in thru the  Main Entrance and check in at the main information desk  You need to re-schedule your appointment should you arrive 10 or more minutes late.  We strive to give you quality time with our providers, and arriving late affects you and other patients whose appointments are after yours.  Also, if you no show three or more times for appointments you may be dismissed from the clinic at the providers discretion.     Again, thank you for choosing [facility].  Our hope is that these requests will decrease the amount of time that you wait before being seen by our physicians.       _____________________________________________________________  Should you have questions after your visit to  Cancer Center, please contact our office at [phone] between the hours of 8:00 a.m. and 4:30 p.m.  Voicemails left after 4:00 p.m. will not be returned until the following business day.  For prescription refill requests, have your pharmacy contact our office and allow 72 hours.    Cancer Center Support Programs:   > Cancer Support Group  2nd Tuesday of the month 1pm-2pm, Journey Room   

## 2018-02-23 ENCOUNTER — Telehealth: Payer: Self-pay | Admitting: Rheumatology

## 2018-02-23 NOTE — Telephone Encounter (Signed)
Attempted to contact the patient and left message for patient to call the office.  

## 2018-02-23 NOTE — Telephone Encounter (Signed)
Noted  

## 2018-02-23 NOTE — Telephone Encounter (Signed)
Curtis Flores left a voicemail stating that Byran will come by the office on Friday 12/27 for labwork.

## 2018-02-23 NOTE — Telephone Encounter (Signed)
Please advise patient to come and get CBC and CMP if his CBC is normal then we can start him back on Imuran and Orencia.

## 2018-02-23 NOTE — Telephone Encounter (Signed)
Spoke with [name], patient's wife, advised patient needs a CBC and CMP. Will come by 02/26/18 to have drawn.

## 2018-02-27 ENCOUNTER — Other Ambulatory Visit: Payer: Self-pay

## 2018-02-27 DIAGNOSIS — Z79899 Other long term (current) drug therapy: Secondary | ICD-10-CM

## 2018-02-27 LAB — CBC WITH DIFFERENTIAL/PLATELET
Absolute Monocytes: 1513 cells/uL — ABNORMAL HIGH (ref 200–950)
BASOS PCT: 0.5 %
Basophils Absolute: 85 cells/uL (ref 0–200)
Eosinophils Absolute: 187 cells/uL (ref 15–500)
Eosinophils Relative: 1.1 %
HCT: 43 % (ref 38.5–50.0)
Hemoglobin: 13.6 g/dL (ref 13.2–17.1)
Lymphs Abs: 3417 cells/uL (ref 850–3900)
MCH: 26.7 pg — ABNORMAL LOW (ref 27.0–33.0)
MCHC: 31.6 g/dL — ABNORMAL LOW (ref 32.0–36.0)
MCV: 84.3 fL (ref 80.0–100.0)
MPV: 12.6 fL — ABNORMAL HIGH (ref 7.5–12.5)
Monocytes Relative: 8.9 %
Neutro Abs: 11798 cells/uL — ABNORMAL HIGH (ref 1500–7800)
Neutrophils Relative %: 69.4 %
Platelets: 235 10*3/uL (ref 140–400)
RBC: 5.1 10*6/uL (ref 4.20–5.80)
RDW: 14 % (ref 11.0–15.0)
TOTAL LYMPHOCYTE: 20.1 %
WBC: 17 10*3/uL — ABNORMAL HIGH (ref 3.8–10.8)

## 2018-02-27 LAB — COMPLETE METABOLIC PANEL WITH GFR
AG Ratio: 1.2 (calc) (ref 1.0–2.5)
ALT: 18 U/L (ref 9–46)
AST: 22 U/L (ref 10–35)
Albumin: 3.5 g/dL — ABNORMAL LOW (ref 3.6–5.1)
Alkaline phosphatase (APISO): 67 U/L (ref 40–115)
BUN: 14 mg/dL (ref 7–25)
CO2: 29 mmol/L (ref 20–32)
Calcium: 9.6 mg/dL (ref 8.6–10.3)
Chloride: 103 mmol/L (ref 98–110)
Creat: 0.74 mg/dL (ref 0.70–1.25)
GFR, Est African American: 113 mL/min/{1.73_m2} (ref >=60)
GFR, Est Non African American: 97 mL/min/{1.73_m2} (ref >=60)
GLOBULIN: 2.9 g/dL (ref 1.9–3.7)
Glucose, Bld: 102 mg/dL — ABNORMAL HIGH (ref 65–99)
Potassium: 3.8 mmol/L (ref 3.5–5.3)
SODIUM: 140 mmol/L (ref 135–146)
Total Bilirubin: 0.4 mg/dL (ref 0.2–1.2)
Total Protein: 6.4 g/dL (ref 6.1–8.1)

## 2018-03-05 ENCOUNTER — Telehealth: Payer: Self-pay | Admitting: Rheumatology

## 2018-03-05 MED ORDER — ABATACEPT 125 MG/ML ~~LOC~~ SOAJ: 125.0000 mg | SUBCUTANEOUS | 0 refills | Status: DC

## 2018-03-05 NOTE — Telephone Encounter (Signed)
Patient's wife [name] left a voicemail requesting a return call to let her know if the prescription of Maureen Chatters has been ordered.

## 2018-03-05 NOTE — Telephone Encounter (Signed)
Last visit: 07/01/2017 Next visit: 12/02/2017 Labs: 01/21/18 Labs are stable. Elevated WBC count noted Tb Gold: 01/21/18 Neg   Okay to refill per Dr. Estanislado Pandy.  Prescription faxed to [employer]

## 2018-03-06 NOTE — Telephone Encounter (Signed)
Received fax from [facility], patient's application has been APPROVED. Coverage dates are 03/05/2018 to 03/04/2019.  Will send document to scan center.  Phone# [phone] Fax# [phone]  Left message for patient to advise.  11:31 AM Beatriz Chancellor, CPhT

## 2018-03-11 ENCOUNTER — Telehealth: Payer: Self-pay | Admitting: Rheumatology

## 2018-03-11 NOTE — Telephone Encounter (Signed)
Patient's wife advised that [place] may start the [place] when it is delivered. [name] verbalized understanding.

## 2018-03-11 NOTE — Telephone Encounter (Signed)
Patients wife calling to see if patient needs to go ahead and start Orencia again since he received delievery, or wait til Friday. Please call to advise.

## 2018-03-12 ENCOUNTER — Other Ambulatory Visit: Payer: Self-pay | Admitting: Rheumatology

## 2018-03-12 ENCOUNTER — Other Ambulatory Visit: Payer: Self-pay | Admitting: Cardiovascular Disease

## 2018-03-12 NOTE — Telephone Encounter (Signed)
Last visit: 12/02/17 Next visit: 05/05/18  Okay to refill per Dr. Estanislado Pandy

## 2018-03-16 ENCOUNTER — Telehealth: Payer: Self-pay | Admitting: Rheumatology

## 2018-03-16 NOTE — Telephone Encounter (Signed)
Chart noted.

## 2018-03-16 NOTE — Telephone Encounter (Signed)
FYI: Patients wife calling to let you know he has switched insurances to [employer]. Patients medication will need to be sent to [facility] order pharmacy. Patient is not due for refills as of yet.

## 2018-03-18 ENCOUNTER — Telehealth: Payer: Self-pay | Admitting: Internal Medicine

## 2018-03-18 NOTE — Telephone Encounter (Signed)
[facility]ed[facility] pt request.

## 2018-03-18 NOTE — Telephone Encounter (Signed)
Patient wife called to tell us his pharmacy will be [facility]

## 2018-03-20 ENCOUNTER — Ambulatory Visit (INDEPENDENT_AMBULATORY_CARE_PROVIDER_SITE_OTHER): Payer: Medicare HMO | Admitting: Pulmonary Disease

## 2018-03-20 ENCOUNTER — Ambulatory Visit (INDEPENDENT_AMBULATORY_CARE_PROVIDER_SITE_OTHER)
Admission: RE | Admit: 2018-03-20 | Discharge: 2018-03-20 | Disposition: A | Discharge status: Home / Self Care | Payer: Medicare HMO | Source: Ambulatory Visit | Attending: Pulmonary Disease | Admitting: Pulmonary Disease

## 2018-03-20 ENCOUNTER — Telehealth: Payer: Self-pay | Admitting: Pulmonary Disease

## 2018-03-20 ENCOUNTER — Encounter: Payer: Self-pay | Admitting: Pulmonary Disease

## 2018-03-20 VITALS — BP 116/70 | HR 46 | Ht 67.0 in | Wt 198.0 lb

## 2018-03-20 DIAGNOSIS — J181 Lobar pneumonia, unspecified organism: Secondary | ICD-10-CM

## 2018-03-20 DIAGNOSIS — J8410 Pulmonary fibrosis, unspecified: Secondary | ICD-10-CM

## 2018-03-20 DIAGNOSIS — J9601 Acute respiratory failure with hypoxia: Secondary | ICD-10-CM

## 2018-03-20 DIAGNOSIS — M05731 Rheumatoid arthritis with rheumatoid factor of right wrist without organ or systems involvement: Secondary | ICD-10-CM

## 2018-03-20 DIAGNOSIS — M05732 Rheumatoid arthritis with rheumatoid factor of left wrist without organ or systems involvement: Secondary | ICD-10-CM

## 2018-03-20 LAB — PULMONARY FUNCTION TEST
DL/VA % pred: 90 %
DL/VA: 4.02 ml/min/mmHg/L
DLCO cor % pred: 42 %
DLCO cor: 12.13 ml/min/mmHg
DLCO unc % pred: 41 %
DLCO unc: 11.77 ml/min/mmHg
FEF 25-75 Post: 1.07 L/sec
FEF 25-75 Pre: 1.04 L/sec
FEF2575-%Change-Post: 3 %
FEF2575-%PRED-POST: 43 %
FEF2575-%Pred-Pre: 42 %
FEV1-%Change-Post: 2 %
FEV1-%Pred-Post: 53 %
FEV1-%Pred-Pre: 52 %
FEV1-Post: 1.44 L
FEV1-Pre: 1.41 L
FEV1FVC-%CHANGE-POST: 5 %
FEV1FVC-%Pred-Pre: 97 %
FEV6-%Change-Post: -3 %
FEV6-%Pred-Post: 53 %
FEV6-%Pred-Pre: 55 %
FEV6-Post: 1.8 L
FEV6-Pre: 1.86 L
FEV6FVC-%Pred-Post: 104 %
FEV6FVC-%Pred-Pre: 104 %
FVC-%Change-Post: -3 %
FVC-%Pred-Post: 51 %
FVC-%Pred-Pre: 53 %
FVC-Post: 1.8 L
FVC-Pre: 1.86 L
Post FEV1/FVC ratio: 80 %
Post FEV6/FVC ratio: 100 %
Pre FEV1/FVC ratio: 76 %
Pre FEV6/FVC Ratio: 100 %
RV % pred: 57 %
RV: 1.24 L
TLC % pred: 49 %
TLC: 3.15 L

## 2018-03-20 NOTE — Assessment & Plan Note (Addendum)
Lung function shows 15% drop likely due to recent pneumonia Treatment of fibrosis essentially is treatment and control of active arthritis

## 2018-03-20 NOTE — Progress Notes (Signed)
   Subjective:    Patient ID: Curtis Flores, male    DOB: 07-24-1955, 63 y.o.   MRN: [id number]  HPI  56M, ex smoker for FU of ILD - favor NSIP  He has psoriasis since 2001, rheumatoid arthritis was diagnosed 2012  Seen by Dr Lamonte Sakai for persistent leucocytosis, bone marrow biopsy neg  A Flutter -s/p  ablation and amiodarone wasstopped.   Chief Complaint  Patient presents with  . Follow-up    F/U with PFT. States his breathing has been the same since last visit.       He is followed by rheumatology and is on Orencia, Imuran and prednisone 10 mg .  Adm 02/2018 for Pna/ hypoxia and discharged on oxygen.  His Imuran was stopped and he has restarted back on this about a week ago.  He feels much improved from pneumonia.  He has decreased his oxygen use to as needed basis.  He does remain short of breath on usual exertion. He remains on 10 mg of prednisone.  He states that Dr. Estanislado Pandy recommended that he get a second opinion at [facility] but he has been unable to go there.  He thinks that Maureen Chatters has stopped working.  He complains of a flareup of his arthritis in both hands with increased swelling  We reviewed PFTs today which show drop in FVC although DLCO is maintained  He did not desaturate on walking 3 laps today   Significant tests/ events 2012 -presented with a symmetric polyarthritis of elbows, wrists, hands, neck, shoulders &feet  CXR at [facility] showed bibasal interstitial fibrosis with hilar lymphadenopathy With POS RA 89 & CCP 122, ANA neg, HLA B 27 neg, CK 87  PPD neg, hep panel neg  HRCT 05/2015- extensive regions of ground-glass attenuation and most pronounced in the lower lobes of the lungs bilaterally. These areas demonstrate internal interstitial prominence with a large amount of microcystic honeycombingwith some cylindrical bronchiectasis and bronchiolectasis. moderate hiatal hernia was also noted.   PFTs 2013 showed intraprenchymal restricton with FVC around  71% &DLCO 56% c/w ILD, no airway obstruction.  Rpt PFTs 10/2012 show mild drop in FVC to 66%, DLCO preserved   PFT 05/2015 FEV1 70%, ratio 82, FVC 66%, no BD response, DLCO 46%. DLCO has decreased from 2014.  CT chest 05/12/15 showed ILD/NSIP , slight progression compared from 2013.   PFT 12/2015 >> FVC 71%, DLCO 45% unc  PFTs 03/2018 show drop in FVC to 53%, TLC to 49%, DLCO maintained at 41%  Review of Systems neg for any significant sore throat, dysphagia, itching, sneezing, nasal congestion or excess/ purulent secretions, fever, chills, sweats, unintended wt loss, pleuritic or exertional cp, hempoptysis, orthopnea pnd or change in chronic leg swelling. Also denies presyncope, palpitations, heartburn, abdominal pain, nausea, vomiting, diarrhea or change in bowel or urinary habits, dysuria,hematuria, rash, arthralgias, visual complaints, headache, numbness weakness or ataxia.     Objective:   Physical Exam  Gen. Pleasant, well-nourished, in no distress, normal affect ENT - no pallor,icterus, no post nasal drip Neck: No JVD, no thyromegaly, no carotid bruits Lungs: no use of accessory muscles, no dullness to percussion, bibasal rales no rhonchi  Cardiovascular: Rhythm regular, heart sounds  normal, no murmurs or gallops, no peripheral edema Abdomen: soft and non-tender, no hepatosplenomegaly, BS normal. Musculoskeletal: Swollen joints in both hands and wrist, no cyanosis or clubbing Neuro:  alert, non focal        Assessment & Plan:

## 2018-03-20 NOTE — Progress Notes (Signed)
PFT done today. 

## 2018-03-20 NOTE — Telephone Encounter (Signed)
Called and spoke with sandra, Patient's Wife, (on [place]).  Informed her that I saw where [facility] had left a message to call them with the DME for his O2.  She stated that he used [facility].  Will route message to [facility]

## 2018-03-20 NOTE — Assessment & Plan Note (Signed)
He is having a flareup in his hands will treat with a steroid boost Increase prednisone to 20 mg for 5 days then 15 mg for 5 days then back down to 10 mg daily That should give enough time for Imuran to get back into your system  Encouraged him to go to [facility] as has been recommended by his primary rheumatologist for a second opinion

## 2018-03-20 NOTE — Assessment & Plan Note (Signed)
Okay to discontinue oxygen

## 2018-03-20 NOTE — Patient Instructions (Signed)
Lung function shows 15% drop. Increase prednisone to 20 mg for 5 days then 15 mg for 5 days then back down to 10 mg daily That should give enough time for Imuran to get back into your system   Ambulatory saturation to decide whether you still need oxygen

## 2018-03-23 NOTE — Telephone Encounter (Signed)
Order sent to [facility]

## 2018-03-30 ENCOUNTER — Other Ambulatory Visit: Payer: Self-pay | Admitting: Cardiovascular Disease

## 2018-03-30 ENCOUNTER — Other Ambulatory Visit: Payer: Self-pay | Admitting: Rheumatology

## 2018-03-30 ENCOUNTER — Other Ambulatory Visit: Payer: Self-pay

## 2018-03-30 ENCOUNTER — Telehealth: Payer: Self-pay | Admitting: Rheumatology

## 2018-03-30 DIAGNOSIS — Z79899 Other long term (current) drug therapy: Secondary | ICD-10-CM

## 2018-03-30 NOTE — Telephone Encounter (Signed)
Last visit: 12/02/17 Next visit: 05/05/18  Okay to refill per Dr. Estanislado Pandy

## 2018-03-30 NOTE — Telephone Encounter (Signed)
Patient needs rx Prednisone, and Imuran sent to [facility] mail order Fax# [phone]

## 2018-03-30 NOTE — Telephone Encounter (Signed)
Patient's wife advised that [facility]. Advised that the Imuran is not due to be refilled until next month.

## 2018-03-31 LAB — COMPLETE METABOLIC PANEL WITH GFR
AG Ratio: 1.2 (calc) (ref 1.0–2.5)
ALT: 22 U/L (ref 9–46)
AST: 22 U/L (ref 10–35)
Albumin: 3.5 g/dL — ABNORMAL LOW (ref 3.6–5.1)
Alkaline phosphatase (APISO): 59 U/L (ref 40–115)
BUN: 13 mg/dL (ref 7–25)
CO2: 29 mmol/L (ref 20–32)
Calcium: 9.4 mg/dL (ref 8.6–10.3)
Chloride: 104 mmol/L (ref 98–110)
Creat: 0.86 mg/dL (ref 0.70–1.25)
GFR, Est African American: 106 mL/min/{1.73_m2} (ref >=60)
GFR, Est Non African American: 92 mL/min/{1.73_m2} (ref >=60)
Globulin: 3 g/dL (calc) (ref 1.9–3.7)
Glucose, Bld: 104 mg/dL — ABNORMAL HIGH (ref 65–99)
POTASSIUM: 4.3 mmol/L (ref 3.5–5.3)
SODIUM: 141 mmol/L (ref 135–146)
Total Bilirubin: 0.5 mg/dL (ref 0.2–1.2)
Total Protein: 6.5 g/dL (ref 6.1–8.1)

## 2018-03-31 LAB — CBC WITH DIFFERENTIAL/PLATELET
Absolute Monocytes: 774 cells/uL (ref 200–950)
Basophils Absolute: 103 cells/uL (ref 0–200)
Basophils Relative: 0.6 %
Eosinophils Absolute: 52 cells/uL (ref 15–500)
Eosinophils Relative: 0.3 %
HCT: 43.2 % (ref 38.5–50.0)
Hemoglobin: 14.1 g/dL (ref 13.2–17.1)
Lymphs Abs: 3113 cells/uL (ref 850–3900)
MCH: 27.3 pg (ref 27.0–33.0)
MCHC: 32.6 g/dL (ref 32.0–36.0)
MCV: 83.6 fL (ref 80.0–100.0)
MPV: 12.2 fL (ref 7.5–12.5)
Monocytes Relative: 4.5 %
Neutro Abs: 13158 cells/uL — ABNORMAL HIGH (ref 1500–7800)
Neutrophils Relative %: 76.5 %
Platelets: 266 10*3/uL (ref 140–400)
RBC: 5.17 10*6/uL (ref 4.20–5.80)
RDW: 13.6 % (ref 11.0–15.0)
TOTAL LYMPHOCYTE: 18.1 %
WBC: 17.2 10*3/uL — ABNORMAL HIGH (ref 3.8–10.8)

## 2018-03-31 NOTE — Progress Notes (Signed)
Labs are stable.

## 2018-04-08 ENCOUNTER — Telehealth: Payer: Self-pay

## 2018-04-08 ENCOUNTER — Other Ambulatory Visit: Payer: Self-pay

## 2018-04-08 MED ORDER — CARVEDILOL 12.5 MG PO TABS: 18.7500 mg | ORAL_TABLET | Freq: Two times a day (BID) | ORAL | 2 refills | Status: DC

## 2018-04-08 MED ORDER — ATORVASTATIN CALCIUM 80 MG PO TABS: 80.0000 mg | ORAL_TABLET | Freq: Every day | ORAL | 2 refills | Status: DC

## 2018-04-08 MED ORDER — FUROSEMIDE 40 MG PO TABS: 40.0000 mg | ORAL_TABLET | Freq: Two times a day (BID) | ORAL | 2 refills | Status: DC

## 2018-04-08 MED ORDER — LOSARTAN POTASSIUM 100 MG PO TABS: 100.0000 mg | ORAL_TABLET | Freq: Every day | ORAL | 2 refills | Status: DC

## 2018-04-08 MED ORDER — PANTOPRAZOLE SODIUM 40 MG PO TBEC: 40.0000 mg | DELAYED_RELEASE_TABLET | Freq: Two times a day (BID) | ORAL | 2 refills | Status: DC

## 2018-04-08 MED ORDER — CLOPIDOGREL BISULFATE 75 MG PO TABS: 75.0000 mg | ORAL_TABLET | Freq: Every day | ORAL | 2 refills | Status: DC

## 2018-04-08 NOTE — Addendum Note (Signed)
Addended by: Annitta Needs on: 04/08/2018 10:39 AM   Modules accepted: Orders

## 2018-04-08 NOTE — Telephone Encounter (Signed)
Rx(s) sent to pharmacy electronically.  

## 2018-04-08 NOTE — Telephone Encounter (Signed)
Refill request from [facility] mail order [phone], fax [phone], for Pantoprazole 40 mg daily.

## 2018-04-08 NOTE — Telephone Encounter (Signed)
Done

## 2018-04-09 ENCOUNTER — Other Ambulatory Visit: Payer: Self-pay | Admitting: *Deleted

## 2018-04-09 MED ORDER — FUROSEMIDE 40 MG PO TABS: 40.0000 mg | ORAL_TABLET | Freq: Two times a day (BID) | ORAL | 3 refills | Status: DC

## 2018-04-09 MED ORDER — ATORVASTATIN CALCIUM 80 MG PO TABS: 80.0000 mg | ORAL_TABLET | Freq: Every day | ORAL | 3 refills | Status: DC

## 2018-04-09 MED ORDER — CLOPIDOGREL BISULFATE 75 MG PO TABS: 75.0000 mg | ORAL_TABLET | Freq: Every day | ORAL | 3 refills | Status: DC

## 2018-04-09 MED ORDER — CARVEDILOL 12.5 MG PO TABS: 18.7500 mg | ORAL_TABLET | Freq: Two times a day (BID) | ORAL | 3 refills | Status: DC

## 2018-04-09 MED ORDER — LOSARTAN POTASSIUM 100 MG PO TABS: 100.0000 mg | ORAL_TABLET | Freq: Every day | ORAL | 3 refills | Status: DC

## 2018-04-10 MED ORDER — PANTOPRAZOLE SODIUM 40 MG PO TBEC: 40.0000 mg | DELAYED_RELEASE_TABLET | Freq: Two times a day (BID) | ORAL | 2 refills | Status: DC

## 2018-04-10 NOTE — Telephone Encounter (Signed)
New rx request from [facility] for Pantoprazole 40 mg , phone [phone], fax [phone].

## 2018-04-10 NOTE — Telephone Encounter (Signed)
Done

## 2018-04-10 NOTE — Addendum Note (Signed)
Addended by: Gordy Levan, Katiya Fike A on: 04/10/2018 02:46 PM   Modules accepted: Orders

## 2018-04-10 NOTE — Telephone Encounter (Signed)
Pantoprazole 40 mg is taken bid daily.

## 2018-04-14 ENCOUNTER — Other Ambulatory Visit: Payer: Self-pay | Admitting: *Deleted

## 2018-04-14 DIAGNOSIS — D508 Other iron deficiency anemias: Secondary | ICD-10-CM

## 2018-04-14 MED ORDER — PREDNISONE 10 MG PO TABS: 10.0000 mg | ORAL_TABLET | Freq: Every day | ORAL | 0 refills | Status: DC

## 2018-04-14 MED ORDER — AZATHIOPRINE 50 MG PO TABS: 50.0000 mg | ORAL_TABLET | Freq: Two times a day (BID) | ORAL | 0 refills | Status: DC

## 2018-04-14 NOTE — Telephone Encounter (Signed)
Refill request received via fax  Last visit: 12/02/17 Next visit: 05/05/18 Labs: 03/30/18 stable  Okay to refill per Dr. Estanislado Pandy

## 2018-04-16 ENCOUNTER — Other Ambulatory Visit: Payer: Self-pay | Admitting: *Deleted

## 2018-04-16 MED ORDER — CARVEDILOL 12.5 MG PO TABS: 18.7500 mg | ORAL_TABLET | Freq: Two times a day (BID) | ORAL | 1 refills | Status: DC

## 2018-04-16 MED ORDER — LOSARTAN POTASSIUM 100 MG PO TABS: 100.0000 mg | ORAL_TABLET | Freq: Every day | ORAL | 1 refills | Status: DC

## 2018-04-16 MED ORDER — ATORVASTATIN CALCIUM 80 MG PO TABS: 80.0000 mg | ORAL_TABLET | Freq: Every day | ORAL | 1 refills | Status: DC

## 2018-04-16 MED ORDER — FUROSEMIDE 40 MG PO TABS: 40.0000 mg | ORAL_TABLET | Freq: Two times a day (BID) | ORAL | 1 refills | Status: DC

## 2018-04-16 MED ORDER — CLOPIDOGREL BISULFATE 75 MG PO TABS: 75.0000 mg | ORAL_TABLET | Freq: Every day | ORAL | 1 refills | Status: DC

## 2018-04-30 ENCOUNTER — Other Ambulatory Visit: Payer: Self-pay | Admitting: Rheumatology

## 2018-04-30 NOTE — Telephone Encounter (Signed)
Last visit: 12/02/17 Next visit: 05/05/18  Okay to refill per Dr. Estanislado Pandy

## 2018-05-01 NOTE — Progress Notes (Deleted)
Office Visit Note  Patient: Curtis Flores             Date of Birth: [birth year]           MRN: [id number]             PCP: Dione Housekeeper, MD Referring: Dione Housekeeper, MD Visit Date: 05/15/2018 Occupation: @GUAROCC @  Subjective:  No chief complaint on file.   History of Present Illness: Curtis Flores is a 63 y.o. male ***   Activities of Daily Living:  Patient reports morning stiffness for *** {minute/hour:19697}.   Patient {ACTIONS;DENIES/REPORTS:21021675::"Denies"} nocturnal pain.  Difficulty dressing/grooming: {ACTIONS;DENIES/REPORTS:21021675::"Denies"} Difficulty climbing stairs: {ACTIONS;DENIES/REPORTS:21021675::"Denies"} Difficulty getting out of chair: {ACTIONS;DENIES/REPORTS:21021675::"Denies"} Difficulty using hands for taps, buttons, cutlery, and/or writing: {ACTIONS;DENIES/REPORTS:21021675::"Denies"}  No Rheumatology ROS completed.   PMFS History:  Patient Active Problem List   Diagnosis Date Noted  . Lobar pneumonia, unspecified organism ([place]) 01/31/2018  . Sepsis ([place]) 01/31/2018  . Acute respiratory failure with hypoxia ([place]) 01/31/2018  . CAD (coronary artery disease) 01/31/2018  . Elevated troponin 01/31/2018  . Ischemic cardiomyopathy 12/04/2016  . Leukocytosis 07/17/2016  . Pain in joint involving multiple sites 04/25/2016  . Psoriatic arthritis ([place]) 02/14/2016  . High risk medication use 02/14/2016  . NICM (nonischemic cardiomyopathy) ([place]) 08/29/2015  . Essential hypertension 08/29/2015  . Abnormal CT scan, stomach   . Hiatal hernia   . GERD (gastroesophageal reflux disease) 08/01/2015  . Weight loss, unintentional 08/01/2015  . Iron deficiency anemia 06/06/2015  . Heme positive stool 06/06/2015  . PAF (paroxysmal atrial fibrillation) ([place]) 05/30/2015  . CAD S/P percutaneous coronary angioplasty 04/07/2015  . Atrial flutter ([place]) 04/07/2015  . 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 Acute on chronic systolic congestive heart  failure ([place]) 04/07/2015  . LBBB (left bundle branch block) 04/07/2015  . Wide-complex tachycardia ([place]) 04/07/2015  . Hyperlipidemia 03/07/2015  . Hypertension   . Psoriasis   . Rheumatoid arthritis ([name])   . Pulmonary fibrosis ([place]) 03/21/2011    Past Medical History:  Diagnosis Date  . Abnormal CT scan, stomach   . Chronic systolic dysfunction of left ventricle   . Coronary artery disease    lateral STEMI 02/22/15  . GERD (gastroesophageal reflux disease)   . Hyperlipidemia   . Hypertension   . Ischemic cardiomyopathy   . LBBB (left bundle branch block)   . Leukocytosis    followed by hematology, reactive  . Lymphadenopathy   . Myocardial infarction ([place]) 02/2015  . Psoriasis 2003  . Psoriatic arthritis ([place])   . Pulmonary fibrosis ([place])   . Rheumatoid arthritis(714.0) 2012  . Typical atrial flutter ([facility])     Family History  Problem Relation Age of Onset  . Hypertension Mother   . Colon cancer Neg Hx    Past Surgical History:  Procedure Laterality Date  . CARDIAC CATHETERIZATION N/A 02/22/2015   Procedure: Left Heart Cath and Coronary Angiography;  Surgeon: Lorretta Harp, MD;  Location: [facility];  Service: Cardiovascular;  Laterality: N/A;  . CARDIAC CATHETERIZATION N/A 02/22/2015   Procedure: Coronary Stent Intervention;  Surgeon: Lorretta Harp, MD;  Location: [facility];  Service: Cardiovascular;  Laterality: N/A;  . CARDIOVERSION N/A 04/12/2015   Procedure: CARDIOVERSION;  Surgeon: Larey Dresser, MD;  Location: [facility] ENDOSCOPY;  Service: Cardiovascular;  Laterality: N/A;  . COLONOSCOPY  07/01/2003   URK:YHCWCB colonic mucosa except for the proximal right colon in the area of ICV which was not seen completely  due to inadequate bowel prep. followed with ACBE which was normal.   . COLONOSCOPY N/A 08/24/2015   Dr. Gala Romney: Normal colon. Next colonoscopy in 10 years.  [name] ELECTROPHYSIOLOGIC STUDY N/A 05/30/2015   Atrial fibrillation ablation by Dr Rayann Heman   . ESOPHAGOGASTRODUODENOSCOPY N/A 08/24/2015   Dr. Gala Romney: Medium-sized hiatal hernia, erosive gastropathy. Cameron lesions. Esophageal mucosa distally suggestive of short segment Barrett's esophagus. Not confirmed on biopsy. Gastric biopsy with minimal chronic inflammation  . GIVENS CAPSULE STUDY N/A 04/17/2016   Procedure: GIVENS CAPSULE STUDY;  Surgeon: Daneil Dolin, MD;  [facility];  Service: Endoscopy;  Laterality: N/A;  Pt to arrive at 8:00 am for 8:30 am appt  . TEE WITHOUT CARDIOVERSION N/A 04/12/2015   Procedure: TRANSESOPHAGEAL ECHOCARDIOGRAM (TEE);  Surgeon: Larey Dresser, MD;  Location: [facility] ENDOSCOPY;  Service: Cardiovascular;  Laterality: N/A;   Social History   Social History Narrative  . Not on file   Immunization History  Administered Date(s) Administered  . Influenza Split 12/22/2010  . Influenza Whole 01/18/2010  . Influenza, High Dose Seasonal PF 11/11/2017  . Influenza-Unspecified 12/03/2014, 12/13/2015  . Pneumococcal Conjugate-13 08/20/2016  . Pneumococcal Polysaccharide-23 01/22/2011     Objective: Vital Signs: There were no vitals taken for this visit.   Physical Exam   Musculoskeletal Exam: ***  CDAI Exam: CDAI Score: Not documented Patient Global Assessment: Not documented; Provider Global Assessment: Not documented Swollen: Not documented; Tender: Not documented Joint Exam   Not documented   There is currently no information documented on the homunculus. Go to the Rheumatology activity and complete the homunculus joint exam.  Investigation: No additional findings.  Imaging: No results found.  Recent Labs: Lab Results  Component Value Date   WBC 17.2 (H) 03/30/2018   HGB 14.1 03/30/2018   PLT 266 03/30/2018   NA 141 03/30/2018   K 4.3 03/30/2018   CL 104 03/30/2018   CO2 29 03/30/2018   GLUCOSE 104 (H) 03/30/2018   BUN 13 03/30/2018   CREATININE 0.86 03/30/2018   BILITOT 0.5 03/30/2018   ALKPHOS 73 01/30/2018   AST 22  03/30/2018   ALT 22 03/30/2018   PROT 6.5 03/30/2018   ALBUMIN 2.9 (L) 01/30/2018   CALCIUM 9.4 03/30/2018   GFRAA 106 03/30/2018   QFTBGOLDPLUS NEGATIVE 01/21/2018    Speciality Comments: No specialty comments available.  Procedures:  No procedures performed Allergies: Patient has no known allergies.   Assessment / Plan:     Visit Diagnoses: No diagnosis found.   Orders: No orders of the defined types were placed in this encounter.  No orders of the defined types were placed in this encounter.   Face-to-face time spent with patient was *** minutes. Greater than 50% of time was spent in counseling and coordination of care.  Follow-Up Instructions: No follow-ups on file.   Ofilia Neas, PA-C  Note - This record has been created using Dragon software.  Chart creation errors have been sought, but may not always  have been located. Such creation errors do not reflect on  the standard of medical care.

## 2018-05-05 ENCOUNTER — Ambulatory Visit: Payer: Medicare Other | Admitting: Physician Assistant

## 2018-05-09 ENCOUNTER — Inpatient Hospital Stay (HOSPITAL_COMMUNITY): Payer: Medicare HMO

## 2018-05-09 ENCOUNTER — Inpatient Hospital Stay (HOSPITAL_COMMUNITY)
Admission: AD | Admit: 2018-05-09 | Discharge: 2018-05-13 | DRG: 250 | Disposition: A | Discharge status: Home / Self Care | Payer: Medicare HMO | Source: Other Acute Inpatient Hospital | Attending: Cardiovascular Disease | Admitting: Cardiovascular Disease

## 2018-05-09 ENCOUNTER — Other Ambulatory Visit: Payer: Self-pay

## 2018-05-09 ENCOUNTER — Encounter (HOSPITAL_COMMUNITY): Payer: Self-pay

## 2018-05-09 DIAGNOSIS — Z7952 Long term (current) use of systemic steroids: Secondary | ICD-10-CM | POA: Diagnosis not present

## 2018-05-09 DIAGNOSIS — L4050 Arthropathic psoriasis, unspecified: Secondary | ICD-10-CM | POA: Diagnosis present

## 2018-05-09 DIAGNOSIS — I10 Essential (primary) hypertension: Secondary | ICD-10-CM | POA: Diagnosis present

## 2018-05-09 DIAGNOSIS — Z8249 Family history of ischemic heart disease and other diseases of the circulatory system: Secondary | ICD-10-CM

## 2018-05-09 DIAGNOSIS — I493 Ventricular premature depolarization: Secondary | ICD-10-CM | POA: Diagnosis present

## 2018-05-09 DIAGNOSIS — I255 Ischemic cardiomyopathy: Secondary | ICD-10-CM | POA: Diagnosis present

## 2018-05-09 DIAGNOSIS — Z7902 Long term (current) use of antithrombotics/antiplatelets: Secondary | ICD-10-CM

## 2018-05-09 DIAGNOSIS — I428 Other cardiomyopathies: Secondary | ICD-10-CM | POA: Diagnosis not present

## 2018-05-09 DIAGNOSIS — I213 ST elevation (STEMI) myocardial infarction of unspecified site: Secondary | ICD-10-CM

## 2018-05-09 DIAGNOSIS — I110 Hypertensive heart disease with heart failure: Secondary | ICD-10-CM | POA: Diagnosis present

## 2018-05-09 DIAGNOSIS — E785 Hyperlipidemia, unspecified: Secondary | ICD-10-CM | POA: Diagnosis present

## 2018-05-09 DIAGNOSIS — I480 Paroxysmal atrial fibrillation: Secondary | ICD-10-CM | POA: Diagnosis present

## 2018-05-09 DIAGNOSIS — I214 Non-ST elevation (NSTEMI) myocardial infarction: Secondary | ICD-10-CM | POA: Diagnosis present

## 2018-05-09 DIAGNOSIS — Z87891 Personal history of nicotine dependence: Secondary | ICD-10-CM

## 2018-05-09 DIAGNOSIS — M1990 Unspecified osteoarthritis, unspecified site: Secondary | ICD-10-CM | POA: Diagnosis present

## 2018-05-09 DIAGNOSIS — Z9861 Coronary angioplasty status: Secondary | ICD-10-CM | POA: Diagnosis not present

## 2018-05-09 DIAGNOSIS — I5023 Acute on chronic systolic (congestive) heart failure: Secondary | ICD-10-CM | POA: Diagnosis present

## 2018-05-09 DIAGNOSIS — I2584 Coronary atherosclerosis due to calcified coronary lesion: Secondary | ICD-10-CM | POA: Diagnosis present

## 2018-05-09 DIAGNOSIS — J84112 Idiopathic pulmonary fibrosis: Secondary | ICD-10-CM | POA: Diagnosis present

## 2018-05-09 DIAGNOSIS — I25119 Atherosclerotic heart disease of native coronary artery with unspecified angina pectoris: Secondary | ICD-10-CM | POA: Diagnosis present

## 2018-05-09 DIAGNOSIS — Z79899 Other long term (current) drug therapy: Secondary | ICD-10-CM | POA: Diagnosis not present

## 2018-05-09 DIAGNOSIS — I97190 Other postprocedural cardiac functional disturbances following cardiac surgery: Principal | ICD-10-CM | POA: Diagnosis present

## 2018-05-09 DIAGNOSIS — I5022 Chronic systolic (congestive) heart failure: Secondary | ICD-10-CM | POA: Diagnosis present

## 2018-05-09 DIAGNOSIS — I160 Hypertensive urgency: Secondary | ICD-10-CM | POA: Diagnosis present

## 2018-05-09 DIAGNOSIS — T82855A Stenosis of coronary artery stent, initial encounter: Secondary | ICD-10-CM | POA: Diagnosis present

## 2018-05-09 DIAGNOSIS — Y831 Surgical operation with implant of artificial internal device as the cause of abnormal reaction of the patient, or of later complication, without mention of misadventure at the time of the procedure: Secondary | ICD-10-CM | POA: Diagnosis present

## 2018-05-09 DIAGNOSIS — I4891 Unspecified atrial fibrillation: Secondary | ICD-10-CM

## 2018-05-09 DIAGNOSIS — I252 Old myocardial infarction: Secondary | ICD-10-CM

## 2018-05-09 DIAGNOSIS — I4892 Unspecified atrial flutter: Secondary | ICD-10-CM | POA: Diagnosis present

## 2018-05-09 DIAGNOSIS — Z9981 Dependence on supplemental oxygen: Secondary | ICD-10-CM

## 2018-05-09 DIAGNOSIS — I2510 Atherosclerotic heart disease of native coronary artery without angina pectoris: Secondary | ICD-10-CM | POA: Diagnosis not present

## 2018-05-09 DIAGNOSIS — I483 Typical atrial flutter: Secondary | ICD-10-CM | POA: Diagnosis not present

## 2018-05-09 DIAGNOSIS — M069 Rheumatoid arthritis, unspecified: Secondary | ICD-10-CM | POA: Diagnosis present

## 2018-05-09 DIAGNOSIS — I447 Left bundle-branch block, unspecified: Secondary | ICD-10-CM | POA: Diagnosis present

## 2018-05-09 DIAGNOSIS — K219 Gastro-esophageal reflux disease without esophagitis: Secondary | ICD-10-CM | POA: Diagnosis present

## 2018-05-09 DIAGNOSIS — I21A9 Other myocardial infarction type: Secondary | ICD-10-CM | POA: Diagnosis present

## 2018-05-09 DIAGNOSIS — J8410 Pulmonary fibrosis, unspecified: Secondary | ICD-10-CM | POA: Diagnosis not present

## 2018-05-09 DIAGNOSIS — E782 Mixed hyperlipidemia: Secondary | ICD-10-CM | POA: Diagnosis present

## 2018-05-09 LAB — CBC WITH DIFFERENTIAL/PLATELET
Abs Immature Granulocytes: 0.16 10*3/uL — ABNORMAL HIGH (ref 0.00–0.07)
Basophils Absolute: 0.1 10*3/uL (ref 0.0–0.1)
Basophils Relative: 1 %
Eosinophils Absolute: 0.2 10*3/uL (ref 0.0–0.5)
Eosinophils Relative: 1 %
HCT: 45.8 % (ref 39.0–52.0)
Hemoglobin: 14.5 g/dL (ref 13.0–17.0)
Immature Granulocytes: 1 %
Lymphocytes Relative: 39 %
Lymphs Abs: 6.5 10*3/uL — ABNORMAL HIGH (ref 0.7–4.0)
MCH: 27.3 pg (ref 26.0–34.0)
MCHC: 31.7 g/dL (ref 30.0–36.0)
MCV: 86.3 fL (ref 80.0–100.0)
Monocytes Absolute: 1.4 10*3/uL — ABNORMAL HIGH (ref 0.1–1.0)
Monocytes Relative: 8 %
Neutro Abs: 8.5 10*3/uL — ABNORMAL HIGH (ref 1.7–7.7)
Neutrophils Relative %: 50 %
Platelets: 252 10*3/uL (ref 150–400)
RBC: 5.31 MIL/uL (ref 4.22–5.81)
RDW: 14.8 % (ref 11.5–15.5)
WBC: 16.8 10*3/uL — ABNORMAL HIGH (ref 4.0–10.5)
nRBC: 0 % (ref 0.0–0.2)

## 2018-05-09 LAB — LIPID PANEL
Cholesterol: 130 mg/dL (ref 0–200)
HDL: 51 mg/dL (ref >40)
LDL Cholesterol: 71 mg/dL (ref 0–99)
Total CHOL/HDL Ratio: 2.5 RATIO
Triglycerides: 40 mg/dL (ref <150)
VLDL: 8 mg/dL (ref 0–40)

## 2018-05-09 LAB — BASIC METABOLIC PANEL
Anion gap: 9 (ref 5–15)
BUN: 12 mg/dL (ref 8–23)
CO2: 26 mmol/L (ref 22–32)
Calcium: 9.2 mg/dL (ref 8.9–10.3)
Chloride: 104 mmol/L (ref 98–111)
Creatinine, Ser: 0.84 mg/dL (ref 0.61–1.24)
GFR calc Af Amer: >60 mL/min (ref >60)
Glucose, Bld: 96 mg/dL (ref 70–99)
Potassium: 4.3 mmol/L (ref 3.5–5.1)
Sodium: 139 mmol/L (ref 135–145)

## 2018-05-09 LAB — HEPARIN LEVEL (UNFRACTIONATED): Heparin Unfractionated: 0.71 IU/mL — ABNORMAL HIGH (ref 0.30–0.70)

## 2018-05-09 LAB — MRSA PCR SCREENING: MRSA by PCR: NEGATIVE

## 2018-05-09 LAB — COMPREHENSIVE METABOLIC PANEL
ALT: 29 U/L (ref 0–44)
AST: 82 U/L — ABNORMAL HIGH (ref 15–41)
Albumin: 3.1 g/dL — ABNORMAL LOW (ref 3.5–5.0)
Alkaline Phosphatase: 54 U/L (ref 38–126)
Anion gap: 10 (ref 5–15)
BUN: 10 mg/dL (ref 8–23)
CO2: 27 mmol/L (ref 22–32)
Calcium: 9.2 mg/dL (ref 8.9–10.3)
Chloride: 103 mmol/L (ref 98–111)
Creatinine, Ser: 0.79 mg/dL (ref 0.61–1.24)
GFR calc Af Amer: >60 mL/min (ref >60)
GFR calc non Af Amer: >60 mL/min (ref >60)
Glucose, Bld: 82 mg/dL (ref 70–99)
Potassium: 3.3 mmol/L — ABNORMAL LOW (ref 3.5–5.1)
Sodium: 140 mmol/L (ref 135–145)
Total Bilirubin: 0.8 mg/dL (ref 0.3–1.2)
Total Protein: 5.8 g/dL — ABNORMAL LOW (ref 6.5–8.1)

## 2018-05-09 LAB — HEMOGLOBIN A1C
Hgb A1c MFr Bld: 5.6 % (ref 4.8–5.6)
Mean Plasma Glucose: 114.02 mg/dL

## 2018-05-09 LAB — PROTIME-INR
INR: 1.2 (ref 0.8–1.2)
Prothrombin Time: 14.7 seconds (ref 11.4–15.2)

## 2018-05-09 LAB — APTT: aPTT: 98 seconds — ABNORMAL HIGH (ref 24–36)

## 2018-05-09 LAB — ECHOCARDIOGRAM COMPLETE
Height: 67 in
Weight: 3168 oz

## 2018-05-09 LAB — TSH: TSH: 2.689 u[IU]/mL (ref 0.350–4.500)

## 2018-05-09 LAB — TROPONIN I
Troponin I: 13.51 ng/mL (ref <0.03)
Troponin I: 11.87 ng/mL (ref <0.03)

## 2018-05-09 LAB — MAGNESIUM: Magnesium: 2.1 mg/dL (ref 1.7–2.4)

## 2018-05-09 LAB — BRAIN NATRIURETIC PEPTIDE: B Natriuretic Peptide: 636.2 pg/mL — ABNORMAL HIGH (ref 0.0–100.0)

## 2018-05-09 MED ORDER — ACETAMINOPHEN 325 MG PO TABS
650.0000 mg | ORAL_TABLET | ORAL | Status: DC | PRN
  Administered 2018-05-11 – 2018-05-12 (×2): 650 mg via ORAL
  Filled 2018-05-09 (×2): qty 2

## 2018-05-09 MED ORDER — ASPIRIN EC 81 MG PO TBEC
81.0000 mg | DELAYED_RELEASE_TABLET | Freq: Every day | ORAL | Status: DC
  Administered 2018-05-09 – 2018-05-13 (×5): 81 mg via ORAL
  Filled 2018-05-09 (×5): qty 1

## 2018-05-09 MED ORDER — PREDNISONE 10 MG PO TABS
10.0000 mg | ORAL_TABLET | Freq: Every day | ORAL | Status: DC
  Administered 2018-05-09 – 2018-05-13 (×5): 10 mg via ORAL
  Filled 2018-05-09 (×5): qty 1

## 2018-05-09 MED ORDER — CARVEDILOL 25 MG PO TABS
25.0000 mg | ORAL_TABLET | Freq: Two times a day (BID) | ORAL | Status: DC
  Filled 2018-05-09 (×3): qty 1

## 2018-05-09 MED ORDER — NITROGLYCERIN IN D5W 200-5 MCG/ML-% IV SOLN: 2.0000 ug/min | INTRAVENOUS | Status: DC

## 2018-05-09 MED ORDER — AZATHIOPRINE 50 MG PO TABS
50.0000 mg | ORAL_TABLET | Freq: Two times a day (BID) | ORAL | Status: DC
  Administered 2018-05-09 – 2018-05-13 (×9): 50 mg via ORAL
  Filled 2018-05-09 (×10): qty 1

## 2018-05-09 MED ORDER — ABATACEPT 125 MG/ML ~~LOC~~ SOAJ: 125.0000 mg | SUBCUTANEOUS | Status: DC

## 2018-05-09 MED ORDER — CLOPIDOGREL BISULFATE 75 MG PO TABS
75.0000 mg | ORAL_TABLET | Freq: Every day | ORAL | Status: DC
  Administered 2018-05-09 – 2018-05-13 (×5): 75 mg via ORAL
  Filled 2018-05-09 (×5): qty 1

## 2018-05-09 MED ORDER — HEPARIN (PORCINE) 25000 UT/250ML-% IV SOLN
1100.0000 [IU]/h | INTRAVENOUS | Status: DC
  Administered 2018-05-09 – 2018-05-10 (×2): 1200 [IU]/h via INTRAVENOUS
  Filled 2018-05-09 (×2): qty 250

## 2018-05-09 MED ORDER — ONDANSETRON HCL 4 MG/2ML IJ SOLN: 4.0000 mg | Freq: Four times a day (QID) | INTRAMUSCULAR | Status: DC | PRN

## 2018-05-09 MED ORDER — ATORVASTATIN CALCIUM 80 MG PO TABS
80.0000 mg | ORAL_TABLET | Freq: Every day | ORAL | Status: DC
  Administered 2018-05-09 – 2018-05-12 (×4): 80 mg via ORAL
  Filled 2018-05-09 (×5): qty 1

## 2018-05-09 MED ORDER — POTASSIUM CHLORIDE CRYS ER 20 MEQ PO TBCR
40.0000 meq | EXTENDED_RELEASE_TABLET | ORAL | Status: AC
  Administered 2018-05-09 (×2): 40 meq via ORAL
  Filled 2018-05-09 (×2): qty 2

## 2018-05-09 MED ORDER — PANTOPRAZOLE SODIUM 40 MG PO TBEC
40.0000 mg | DELAYED_RELEASE_TABLET | Freq: Two times a day (BID) | ORAL | Status: DC
  Administered 2018-05-09 – 2018-05-13 (×8): 40 mg via ORAL
  Filled 2018-05-09 (×9): qty 1

## 2018-05-09 MED ORDER — CARVEDILOL 6.25 MG PO TABS: 18.7500 mg | ORAL_TABLET | Freq: Two times a day (BID) | ORAL | Status: DC

## 2018-05-09 MED ORDER — FUROSEMIDE 40 MG PO TABS
40.0000 mg | ORAL_TABLET | Freq: Two times a day (BID) | ORAL | Status: DC
  Administered 2018-05-09 – 2018-05-13 (×9): 40 mg via ORAL
  Filled 2018-05-09 (×5): qty 1
  Filled 2018-05-09: qty 2
  Filled 2018-05-09 (×3): qty 1

## 2018-05-09 MED ORDER — LOSARTAN POTASSIUM 50 MG PO TABS
100.0000 mg | ORAL_TABLET | Freq: Every day | ORAL | Status: DC
  Administered 2018-05-09 – 2018-05-13 (×5): 100 mg via ORAL
  Filled 2018-05-09 (×5): qty 2

## 2018-05-09 NOTE — Progress Notes (Signed)
  Echocardiogram 2D Echocardiogram has been performed.  Jennette Dubin 05/09/2018, 4:10 PM

## 2018-05-09 NOTE — Care Management (Signed)
Discussed prescription costs with pharmacist at [facility] [place] per [facility].  Tikosyn (generic) 276mcg or 500 mcg BID:  60 tablets is $86.42 copay.  Tikosyn GoodRX price at [employer] [facility] [employer]: $42.95 for 232mcg BID (#60)  $32.96 for 560mcg BID (#60)  Sotalol: $8/month  Eliquis 5 mg BID: $45/month (#60)  Xarelto: 20 mg daily $45/month (#30)  Informed patient of costs and choices.  Pt weighing risks/benefits of choice between Tikosyn and Sotalol.  Pt will discuss with family this afternoon and RN will inform Dr. Rayann Heman of patient's decision.  If patient chooses Tikosyn, script will need to be called into the [facility] at [facility] to receive the best [employer].

## 2018-05-09 NOTE — Progress Notes (Signed)
ANTICOAGULATION CONSULT NOTE - follow up  [facility] for heparin Indication: atrial fibrillation  No Known Allergies  Patient Measurements: Height: 5\' 7"  (170.2 cm) Weight: 198 lb (89.8 kg) IBW/kg (Calculated) : 66.1 Heparin Dosing Weight: 84.8 kg  Vital Signs: Temp: 98 F (36.7 C) (03/07 1100) Temp Source: Oral (03/07 1100) BP: 130/82 (03/07 1100) Pulse Rate: 58 (03/07 1100)  Labs: Recent Labs    05/09/18 0545 05/09/18 0902 05/09/18 1109  HGB 14.5  --   --   HCT 45.8  --   --   PLT 252  --   --   APTT 98*  --   --   LABPROT 14.7  --   --   INR 1.2  --   --   HEPARINUNFRC  --  0.71*  --   CREATININE 0.79  --   --   TROPONINI 13.51*  --  11.87*    Estimated Creatinine Clearance: 98.4 mL/min (by C-G formula based on SCr of 0.79 mg/dL).   Medical History: Past Medical History:  Diagnosis Date  . Abnormal CT scan, stomach   . Chronic systolic dysfunction of left ventricle   . Coronary artery disease    lateral STEMI 02/22/15  . GERD (gastroesophageal reflux disease)   . Hyperlipidemia   . Hypertension   . Ischemic cardiomyopathy   . LBBB (left bundle branch block)   . Leukocytosis    followed by hematology, reactive  . Lymphadenopathy   . Myocardial infarction ([place]) 02/2015  . Psoriasis 2003  . Psoriatic arthritis ([place])   . Pulmonary fibrosis ([place])   . Rheumatoid arthritis(714.0) 2012  . Typical atrial flutter ([facility])     Medications:  See med history  Assessment: 63 yo man transferred from OSH on heparin drip for afib.  He was given 4000 unit bolus and drip started at 1200 units/hr. Heparin level 0.7 this am CBC stable and no bleeding  Possible start tikosyn Mag ok, K low replace and recheck   Goal of Therapy:  Heparin level 0.3-0.7 units/ml Monitor platelets by anticoagulation protocol: Yes   Plan:  Continue heparin at 1200 units/hr Check heparin level daily Daily HL and CBC Kcl 38meq q4h x2 recheck Bmet Daily Mag, bmet     Bonnita Nasuti Pharm.D. CPP, BCPS Clinical Pharmacist [phone] 05/09/2018 3:18 PM

## 2018-05-09 NOTE — H&P (Signed)
History & Physical    Patient ID: Curtis Flores MRN: [id number], DOB/AGE: 1955/05/01   Admit date: 05/09/2018   Primary Physician: Dione Housekeeper, MD Primary Cardiologist: No primary care provider on file.  Patient Profile    Curtis Flores is a 63 year old gentleman with CAD and inferolateral STEMI in 02/2015, ICM with EF 35-40%, pulmonary fibrosis, RA, atrial flutter s/p ablation 05/2015, prior tobacco abuse, HTN, HL, RA who presented to [facility] today due to substernal chest pain.  Past Medical History    Past Medical History:  Diagnosis Date  . Abnormal CT scan, stomach   . Chronic systolic dysfunction of left ventricle   . Coronary artery disease    lateral STEMI 02/22/15  . GERD (gastroesophageal reflux disease)   . Hyperlipidemia   . Hypertension   . Ischemic cardiomyopathy   . LBBB (left bundle branch block)   . Leukocytosis    followed by hematology, reactive  . Lymphadenopathy   . Myocardial infarction ([place]) 02/2015  . Psoriasis 2003  . Psoriatic arthritis ([place])   . Pulmonary fibrosis ([place])   . Rheumatoid arthritis(714.0) 2012  . Typical atrial flutter [facility])     Past Surgical History:  Procedure Laterality Date  . CARDIAC CATHETERIZATION N/A 02/22/2015   Procedure: Left Heart Cath and Coronary Angiography;  Surgeon: Lorretta Harp, MD;  Location: [place] CV LAB;  Service: Cardiovascular;  Laterality: N/A;  . CARDIAC CATHETERIZATION N/A 02/22/2015   Procedure: Coronary Stent Intervention;  Surgeon: Lorretta Harp, MD;  Location: [place] CV LAB;  Service: Cardiovascular;  Laterality: N/A;  . CARDIOVERSION N/A 04/12/2015   Procedure: CARDIOVERSION;  Surgeon: Larey Dresser, MD;  Location: [facility] ENDOSCOPY;  Service: Cardiovascular;  Laterality: N/A;  . COLONOSCOPY  07/01/2003   ONG:EXBMWU colonic mucosa except for the proximal right colon in the area of ICV which was not seen completely due to inadequate bowel prep. followed with ACBE which was normal.     . COLONOSCOPY N/A 08/24/2015   Dr. Gala Romney: Normal colon. Next colonoscopy in 10 years.  [name] ELECTROPHYSIOLOGIC STUDY N/A 05/30/2015   Atrial fibrillation ablation by Dr Rayann Heman  . ESOPHAGOGASTRODUODENOSCOPY N/A 08/24/2015   Dr. Gala Romney: Medium-sized hiatal hernia, erosive gastropathy. Cameron lesions. Esophageal mucosa distally suggestive of short segment Barrett's esophagus. Not confirmed on biopsy. Gastric biopsy with minimal chronic inflammation  . GIVENS CAPSULE STUDY N/A 04/17/2016   Procedure: GIVENS CAPSULE STUDY;  Surgeon: Daneil Dolin, MD;  [facility];  Service: Endoscopy;  Laterality: N/A;  Pt to arrive at 8:00 am for 8:30 am appt  . TEE WITHOUT CARDIOVERSION N/A 04/12/2015   Procedure: TRANSESOPHAGEAL ECHOCARDIOGRAM (TEE);  Surgeon: Larey Dresser, MD;  Location: [place];  Service: Cardiovascular;  Laterality: N/A;     Allergies  No Known Allergies  History of Present Illness    Curtis Flores is a 63 year old gentleman with CAD and inferolateral STEMI in 02/2015, ICM with EF 35-40%, pulmonary fibrosis, RA, atrial flutter s/p ablation 05/2015, prior tobacco abuse, HTN, HL, RA who presented to [facility] today due to substernal chest pain.  The patient reports that he had felt well since the time of his most recent stent in 2016. In the past few months, however, has noticed gradual increase in shortness of breath and some episodes of mild chest discomfort. In the week leading up to his admission, reports feeling significantly "run down". On the day of admission, patient reports sudden onset severe chest  discomfort for which he presented to the ED at [place], the patient was found to be significantly hypertensive with BP 215/138, tachycardic to 138, O2 sat 96%. ECG showed atrial fibrillation with RVR and redemonstration of known LBBB. Labs significant for creatinine of 0.8, Troponin <0.01 with subsequent of 0.09, elevated WBC at 16.6. He was treated  with 20mg  IV hydralazine, 324mg  PO aspirin, 5mg  IV metoprolol, was started on a nitroglycerin drip due to ongoing chest pain and was started on anticoagulation with IV heparin. He was transferred to [facility] for further care.  At the time of arrival, the patient reports that chest pain has resolved at this time but that it has been waxing and waning during the course of his admission thus far. He denies shortness of breath, orthopnea, PND, LEE or associated symptoms such as nausea, diaphoresis. Notes that BPs at home are generally in the 073X-106Y systolic.   Home Medications    Prior to Admission medications   Medication Sig Start Date End Date Taking? Authorizing Provider  Abatacept (ORENCIA CLICKJECT) 694 MG/ML SOAJ Inject 125 mg into the skin once a week. 03/05/18   Bo Merino, MD  atorvastatin (LIPITOR) 80 MG tablet Take 1 tablet (80 mg total) by mouth daily at 6 PM. 04/16/18   Lorretta Harp, MD  azaTHIOprine (IMURAN) 50 MG tablet Take 1 tablet (50 mg total) by mouth 2 (two) times daily. 04/14/18   Bo Merino, MD  azithromycin (ZITHROMAX) 250 MG tablet Take 1 tablet (250 mg total) by mouth daily. 02/02/18   Patrecia Pour, MD  carvedilol (COREG) 12.5 MG tablet Take 1.5 tablets (18.75 mg total) by mouth 2 (two) times daily with a meal. 04/16/18   Lorretta Harp, MD  Clobetasol Prop Emollient Base (CLOBETASOL PROPIONATE E) 0.05 % emollient cream Apply to affected area BID Patient taking differently: Apply topically 2 (two) times daily.  03/27/17   Bo Merino, MD  clopidogrel (PLAVIX) 75 MG tablet Take 1 tablet (75 mg total) by mouth daily. 04/16/18   Lorretta Harp, MD  furosemide (LASIX) 40 MG tablet Take 1 tablet (40 mg total) by mouth 2 (two) times daily. 04/16/18   Lorretta Harp, MD  losartan (COZAAR) 100 MG tablet Take 1 tablet (100 mg total) by mouth daily. 04/16/18   Lorretta Harp, MD  pantoprazole (PROTONIX) 40 MG tablet Take 1 tablet (40 mg total) by mouth 2 (two)  times daily before a meal. 04/10/18   Carlis Stable, NP  predniSONE (DELTASONE) 10 MG tablet TAKE 1 TABLET BY MOUTH EVERY DAY 04/30/18   Bo Merino, MD    Family History    Family History  Problem Relation Age of Onset  . Hypertension Mother   . Colon cancer Neg Hx    He indicated that his mother is deceased. He indicated that his father is deceased. He indicated that his brother is deceased. He indicated that the status of his neg hx is unknown.   Social History    Social History   Socioeconomic History  . Marital status: Married    Spouse name: Not on file  . Number of children: 2  . Years of education: Not on file  . Highest education level: Not on file  Occupational History  . Occupation: unemployed    Comment: not working do to arthritis; used to be Patent attorney for a [employer]  . Financial resource strain: Not on file  .  Food insecurity:    Worry: Not on file    Inability: Not on file  . Transportation needs:    Medical: Not on file    Non-medical: Not on file  Tobacco Use  . Smoking status: Former Smoker    Packs/day: 1.00    Years: 30.00    Pack years: 30.00    Types: Cigarettes    Last attempt to quit: 03/21/2003    Years since quitting: 15.1  . Smokeless tobacco: Never Used  Substance and Sexual Activity  . Alcohol use: No    Alcohol/week: 0.0 standard drinks    Comment: H/O case of beer weekly x 20 years, quiting in 2000-ish.  . Drug use: No    Comment: H/O marijuana use many years ago.  [name] Sexual activity: Yes    Birth control/protection: None  Lifestyle  . Physical activity:    Days per week: Not on file    Minutes per session: Not on file  . Stress: Not on file  Relationships  . Social connections:    Talks on phone: Not on file    Gets together: Not on file    Attends religious service: Not on file    Active member of club or organization: Not on file    Attends meetings of clubs or organizations: Not on file     Relationship status: Not on file  . Intimate partner violence:    Fear of current or ex partner: Not on file    Emotionally abused: Not on file    Physically abused: Not on file    Forced sexual activity: Not on file  Other Topics Concern  . Not on file  Social History Narrative  . Not on file     Review of Systems    General:  No chills, fever, night sweats or weight changes.  Cardiovascular:  As per HPI Dermatological: No rash, lesions/masses Respiratory: No cough, dyspnea Urologic: No hematuria, dysuria Abdominal:   No nausea, vomiting, diarrhea, bright red blood per rectum, melena, or hematemesis Neurologic:  No visual changes, wkns, changes in mental status. All other systems reviewed and are otherwise negative except as noted above.  Physical Exam    Blood pressure (!) 169/95, pulse 92, temperature 98.5 F (36.9 C), temperature source Oral, resp. rate (!) 21, height 5\' 7"  (1.702 m), weight 89.8 kg, SpO2 98 %.  General: Pleasant, NAD Psych: Normal affect. Neuro: Alert and oriented X 3. Moves all extremities spontaneously. HEENT: Normal  Neck: Supple without bruits or JVD. Lungs:  Resp regular and unlabored, dry bibasilar crackles Heart: Regular rate. Irregularly irregular rhythm. no s3, s4, or murmurs. Abdomen: Soft, non-tender, non-distended, BS + x 4.  Extremities: No clubbing, cyanosis or edema. DP/PT/Radials 2+ and equal bilaterally.  Labs    Troponin (Point of Care Test) No results for input(s): TROPIPOC in the last 72 hours. No results for input(s): CKTOTAL, CKMB, TROPONINI in the last 72 hours. Lab Results  Component Value Date   WBC 17.2 (H) 03/30/2018   HGB 14.1 03/30/2018   HCT 43.2 03/30/2018   MCV 83.6 03/30/2018   PLT 266 03/30/2018   No results for input(s): NA, K, CL, CO2, BUN, CREATININE, CALCIUM, PROT, BILITOT, ALKPHOS, ALT, AST, GLUCOSE in the last 168 hours.  Invalid input(s): LABALBU Lab Results  Component Value Date   CHOL 86 01/31/2018     HDL 32 (L) 01/31/2018   LDLCALC 44 01/31/2018   TRIG 51 01/31/2018   No results found  for: DDIMER   Radiology Studies    No results found.    ECG & Cardiac Imaging    ECG - personally reviewed. Atrial fibrillation. LBBB, similar to prior.   TTE 12/2017 - Left ventricle: The cavity size was mildly dilated. Systolic   function was mildly to moderately reduced. The estimated ejection   fraction was in the range of 40% to 45%. Hypokinesis of the   inferolateral and basal to mid inferior myocardium. Features are   consistent with a pseudonormal left ventricular filling pattern,   with concomitant abnormal relaxation and increased filling   pressure (grade 2 diastolic dysfunction). Doppler parameters are   consistent with high ventricular filling pressure. - Aortic valve: Transvalvular velocity was within the normal range.   There was no stenosis. There was no regurgitation. - Mitral valve: Transvalvular velocity was within the normal range.   There was no evidence for stenosis. There was no regurgitation. - Left atrium: The atrium was severely dilated. - Right ventricle: The cavity size was mildly dilated. Wall   thickness was normal. Systolic function was normal. - Right atrium: The atrium was mildly dilated. - Atrial septum: No defect or patent foramen ovale was identified   by color flow Doppler. - Tricuspid valve: There was mild regurgitation. - Pulmonary arteries: Systolic pressure was severely increased. PA   peak pressure: 68 mm Hg (S).  Cath 02/22/2015  Mid RCA lesion, 80% stenosed.  Ost LAD to Prox LAD lesion, 80% stenosed.  Ost 1st Mrg to 1st Mrg lesion, 80% stenosed. Post intervention, there is a 0% residual stenosis.  Prox Cx to Mid Cx lesion, 99% stenosed. Post intervention, there is a 0% residual stenosis.  There is mild to moderate left ventricular systolic dysfunction.  Assessment & Plan    Curtis Flores is a 63 year old gentleman with CAD and  inferolateral STEMI in 02/2015, ICM with EF 35-40%, pulmonary fibrosis, RA, atrial flutter s/p ablation 05/2015, prior tobacco abuse, HTN, HL, RA who presented to [facility] today due to substernal chest pain and found to have afib with RVR, NSTEMI.  # NSTEMI Patient with recurrence of angina on the day of admission, although with prodrome of shortness of breath and fatigue over weeks. Has known coronary artery disease with residual disease in the LAD which was not intervened upon in 2016 given no evidence of ischemia on nuclear study at that time.  - ASA 324 at [facility], cont 81mg  daily here - patient has been taking plavix at home daily, will continue at this time - atorvastatin 80 - Lipid panel, A1C pending - Cont to trend troponin - Cont home coreg, losartan - heparin gtt - Plan for [facility] on Monday for further evaluation  # Atrial fibrillation with RVR  Known history of atrial flutter with prior ablation in 2017. Not on chronic anticoagulation at home. - Will get TFTs - Heparin gtt as above, CHADS2VASC of 4 thus will need [place] at discharge - currently rate controlled, continue home coreg  # ICM EF known to be 40-45% by TTE in 12/2017. Appears euvolemic at this time - Cont home coreg, losartan - Cont home lasix 40mg  BID - Repeat TTE   # HTN Needs improved blood pressure control.  - Will continue home coreg and losartan. - Add amlodipine 5mg  daily  # HL - Atorvastatin 80mg    # RA - Cont home abatacept - Cont home azathioprine  # Pulmonary fibrosis - Cont home prednisone  # FULL CODE  Signed, Bryna Colander, MD 05/09/2018, 2:34 AM

## 2018-05-09 NOTE — Progress Notes (Signed)
ANTICOAGULATION CONSULT NOTE - Initial Consult  Pharmacy Consult for heparin Indication: atrial fibrillation  No Known Allergies  Patient Measurements:   Heparin Dosing Weight: 84.8 kg  Vital Signs:    Labs: No results for input(s): HGB, HCT, PLT, APTT, LABPROT, INR, HEPARINUNFRC, HEPRLOWMOCWT, CREATININE, CKTOTAL, CKMB, TROPONINI in the last 72 hours.  CrCl cannot be calculated (Patient's most recent lab result is older than the maximum 21 days allowed.).   Medical History: Past Medical History:  Diagnosis Date  . Abnormal CT scan, stomach   . Chronic systolic dysfunction of left ventricle   . Coronary artery disease    lateral STEMI 02/22/15  . GERD (gastroesophageal reflux disease)   . Hyperlipidemia   . Hypertension   . Ischemic cardiomyopathy   . LBBB (left bundle branch block)   . Leukocytosis    followed by hematology, reactive  . Lymphadenopathy   . Myocardial infarction ([place]) 02/2015  . Psoriasis 2003  . Psoriatic arthritis ([place])   . Pulmonary fibrosis ([place])   . Rheumatoid arthritis(714.0) 2012  . Typical atrial flutter ([facility])     Medications:  See med history  Assessment: 63 yo man transferred from OSH on heparin drip for afib.  He was given 4000 unit bolus and drip started at 1200 units/hr. Goal of Therapy:  Heparin level 0.3-0.7 units/ml Monitor platelets by anticoagulation protocol: Yes   Plan:  Continue heparin at 1200 units/hr Check heparin level in 6 hours Daily HL and CBC Monitor for bleeding complications Thanks for allowing pharmacy to be a part of this patient's care.  Excell Seltzer, PharmD Clinical Pharmacist  05/09/2018,2:09 AM

## 2018-05-09 NOTE — Consult Note (Addendum)
ELECTROPHYSIOLOGY CONSULT NOTE    Primary Care Physician: Dione Housekeeper, MD Referring Physician:  Dr Georgette Shell  Admit Date: 05/09/2018  Reason for consultation:  afib with RVR  Curtis Flores is a 63 y.o. male with a h/o pulmonary fibrosis, CAD, ischemic CM, rheumatoid arthritis, and HTN who now presents with NSTEMI in the setting of AF with RVR.  The patient reports gradual worsening of SOB over the past few months.  Over the past week ,he has had significant reduction in exercise capacity.  He developed progressive severe chest pain for which he presented to [facility] ED and was found to have afib with RVR.  He had hypertensive urgency and NSTEMI.  He was transferred to [facility] for further evaluation.  He has been treated with medical therapy including IV heparin since that time.  He converted to sinus rhythm and now reports feeling "fine".  He has a chronic h/o lung disease.  SOB is currently improved.  Today, he denies symptoms of palpitations, chest pain, orthopnea, PND, lower extremity edema, dizziness, presyncope, syncope, or neurologic sequela. The patient is tolerating medications without difficulties and is otherwise without complaint today.   Past Medical History:  Diagnosis Date  . Abnormal CT scan, stomach   . Chronic systolic dysfunction of left ventricle   . Coronary artery disease    lateral STEMI 02/22/15  . GERD (gastroesophageal reflux disease)   . Hyperlipidemia   . Hypertension   . Ischemic cardiomyopathy   . LBBB (left bundle branch block)   . Leukocytosis    followed by hematology, reactive  . Lymphadenopathy   . Myocardial infarction ([place]) 02/2015  . Psoriasis 2003  . Psoriatic arthritis ([place])   . Pulmonary fibrosis ([place])   . Rheumatoid arthritis(714.0) 2012  . Typical atrial flutter [facility])    Past Surgical History:  Procedure Laterality Date  . CARDIAC CATHETERIZATION N/A 02/22/2015   Procedure: Left Heart Cath and Coronary Angiography;  Surgeon:  Lorretta Harp, MD;  Location: [place] CV LAB;  Service: Cardiovascular;  Laterality: N/A;  . CARDIAC CATHETERIZATION N/A 02/22/2015   Procedure: Coronary Stent Intervention;  Surgeon: Lorretta Harp, MD;  Location: [place] CV LAB;  Service: Cardiovascular;  Laterality: N/A;  . CARDIOVERSION N/A 04/12/2015   Procedure: CARDIOVERSION;  Surgeon: Larey Dresser, MD;  Location: [facility] ENDOSCOPY;  Service: Cardiovascular;  Laterality: N/A;  . COLONOSCOPY  07/01/2003   IEP:PIRJJO colonic mucosa except for the proximal right colon in the area of ICV which was not seen completely due to inadequate bowel prep. followed with ACBE which was normal.   . COLONOSCOPY N/A 08/24/2015   Dr. Gala Romney: Normal colon. Next colonoscopy in 10 years.  [name] ELECTROPHYSIOLOGIC STUDY N/A 05/30/2015   Atrial fibrillation ablation by Dr Rayann Heman  . ESOPHAGOGASTRODUODENOSCOPY N/A 08/24/2015   Dr. Gala Romney: Medium-sized hiatal hernia, erosive gastropathy. Cameron lesions. Esophageal mucosa distally suggestive of short segment Barrett's esophagus. Not confirmed on biopsy. Gastric biopsy with minimal chronic inflammation  . GIVENS CAPSULE STUDY N/A 04/17/2016   Procedure: GIVENS CAPSULE STUDY;  Surgeon: Daneil Dolin, MD;  Location: [facility];  Service: Endoscopy;  Laterality: N/A;  Pt to arrive at 8:00 am for 8:30 am appt  . TEE WITHOUT CARDIOVERSION N/A 04/12/2015   Procedure: TRANSESOPHAGEAL ECHOCARDIOGRAM (TEE);  Surgeon: Larey Dresser, MD;  Location: [facility] ENDOSCOPY;  Service: Cardiovascular;  Laterality: N/A;    . [START ON 05/15/2018] Abatacept  125 mg Subcutaneous Q Fri  . [START ON 05/10/2018]  aspirin EC  81 mg Oral Daily  . atorvastatin  80 mg Oral q1800  . azaTHIOprine  50 mg Oral BID  . carvedilol  18.75 mg Oral BID WC  . clopidogrel  75 mg Oral Daily  . furosemide  40 mg Oral BID  . losartan  100 mg Oral Daily  . pantoprazole  40 mg Oral BID AC  . predniSONE  10 mg Oral Daily   . heparin    . nitroGLYCERIN       No Known Allergies  Social History   Socioeconomic History  . Marital status: Married    Spouse name: Not on file  . Number of children: 2  . Years of education: Not on file  . Highest education level: Not on file  Occupational History  . Occupation: unemployed    Comment: not working do to arthritis; used to be Patent attorney for a [employer]  . Financial resource strain: Not on file  . Food insecurity:    Worry: Not on file    Inability: Not on file  . Transportation needs:    Medical: Not on file    Non-medical: Not on file  Tobacco Use  . Smoking status: Former Smoker    Packs/day: 1.00    Years: 30.00    Pack years: 30.00    Types: Cigarettes    Last attempt to quit: 03/21/2003    Years since quitting: 15.1  . Smokeless tobacco: Never Used  Substance and Sexual Activity  . Alcohol use: No    Alcohol/week: 0.0 standard drinks    Comment: H/O case of beer weekly x 20 years, quiting in 2000-ish.  . Drug use: No    Comment: H/O marijuana use many years ago.  [name] Sexual activity: Yes    Birth control/protection: None  Lifestyle  . Physical activity:    Days per week: Not on file    Minutes per session: Not on file  . Stress: Not on file  Relationships  . Social connections:    Talks on phone: Not on file    Gets together: Not on file    Attends religious service: Not on file    Active member of club or organization: Not on file    Attends meetings of clubs or organizations: Not on file    Relationship status: Not on file  . Intimate partner violence:    Fear of current or ex partner: Not on file    Emotionally abused: Not on file    Physically abused: Not on file    Forced sexual activity: Not on file  Other Topics Concern  . Not on file  Social History Narrative  . Not on file    Family History  Problem Relation Age of Onset  . Hypertension Mother   . Colon cancer Neg Hx     ROS- All systems are reviewed and negative  except as per the HPI above  Physical Exam: Telemetry:  afib with V rates 90s---> sinus rhythm! Vitals:   05/09/18 0338 05/09/18 0700 05/09/18 0800 05/09/18 1100  BP: 126/78 136/64 (!) 117/54 130/82  Pulse: 93  70 (!) 58  Resp: 19 17 16 16   Temp: 97.6 F (36.4 C)  98 F (36.7 C) 98 F (36.7 C)  TempSrc: Oral  Oral Oral  SpO2: 97% 96% 97% 93%  Weight:      Height:        GEN- The patient is well appearing,  alert and oriented x 3 today.   Head- normocephalic, atraumatic Eyes-  Sclera clear, conjunctiva pink Ears- hearing intact Oropharynx- clear Neck- supple, no JVP Lungs- fine crackles throughout, normal work of breathing Heart- Regular rate and rhythm, no murmurs, rubs or gallops, PMI not laterally displaced GI- soft, NT, ND, + BS Extremities- no clubbing, cyanosis, or edema MS- no significant deformity or atrophy Skin- no rash or lesion Psych- euthymic mood, full affect Neuro- strength and sensation are intact  EKG-  Sinus rhythm with LBBB, QTc 490 msec  Labs:   Lab Results  Component Value Date   WBC 16.8 (H) 05/09/2018   HGB 14.5 05/09/2018   HCT 45.8 05/09/2018   MCV 86.3 05/09/2018   PLT 252 05/09/2018    Recent Labs  Lab 05/09/18 0545  NA 140  K 3.3*  CL 103  CO2 27  BUN 10  CREATININE 0.79  CALCIUM 9.2  PROT 5.8*  BILITOT 0.8  ALKPHOS 54  ALT 29  AST 82*  GLUCOSE 82   Lab Results  Component Value Date   TROPONINI 11.87 (HH) 05/09/2018    Lab Results  Component Value Date   CHOL 130 05/09/2018   CHOL 86 01/31/2018   CHOL 80 02/22/2015   Lab Results  Component Value Date   HDL 51 05/09/2018   HDL 32 (L) 01/31/2018   HDL 20 (L) 02/22/2015   Lab Results  Component Value Date   LDLCALC 71 05/09/2018   LDLCALC 44 01/31/2018   LDLCALC 48 02/22/2015   Lab Results  Component Value Date   TRIG 40 05/09/2018   TRIG 51 01/31/2018   TRIG 61 02/22/2015   Lab Results  Component Value Date   CHOLHDL 2.5 05/09/2018   CHOLHDL 2.7  01/31/2018   CHOLHDL 4.0 02/22/2015   TTE 12/2017 - Left ventricle: The cavity size was mildly dilated. Systolic function was mildly to moderately reduced. The estimated ejection fraction was in the range of 40% to 45%. Hypokinesis of the inferolateral and basal to mid inferior myocardium. Features are consistent with a pseudonormal left ventricular filling pattern, with concomitant abnormal relaxation and increased filling pressure (grade 2 diastolic dysfunction). Doppler parameters are consistent with high ventricular filling pressure. - Aortic valve: Transvalvular velocity was within the normal range. There was no stenosis. There was no regurgitation. - Mitral valve: Transvalvular velocity was within the normal range. There was no evidence for stenosis. There was no regurgitation. - Left atrium: The atrium was severely dilated. - Right ventricle: The cavity size was mildly dilated. Wall thickness was normal. Systolic function was normal. - Right atrium: The atrium was mildly dilated. - Atrial septum: No defect or patent foramen ovale was identified by color flow Doppler. - Tricuspid valve: There was mild regurgitation. - Pulmonary arteries: Systolic pressure was severely increased. PA peak pressure: 68 mm Hg (S).  Cath 02/22/2015  Mid RCA lesion, 80% stenosed.  Ost LAD to Prox LAD lesion, 80% stenosed.  Ost 1st Mrg to 1st Mrg lesion, 80% stenosed. Post intervention, there is a 0% residual stenosis.  Prox Cx to Mid Cx lesion, 99% stenosed. Post intervention, there is a 0% residual stenosis.  There is mild to moderate left ventricular systolic dysfunction.   Patient Profile    Curtis Flores is a 63 year old gentleman with CAD and inferolateral STEMI in 02/2015, ICM with EF 35-40%, pulmonary fibrosis, RA, atrial flutter s/p ablation 05/2015, prior tobacco abuse, HTN, HL, RA who presented to [facility] with NSTEMI  in the setting of newly diagnosed  afib with RVR.  Assessment & Plan    1. Paroxysmal atrial fibrillation New onset He did have ablation of atrial flutter by me in 2017 without recurrence. He has severe LA enlargement and will almost certainly have further atrial arrhythmias in the future.  I would therefore advise initiation of AAD therapy.  Given CAD and CHF, he is not a candidate for Ics or multaq.  Given fibrotic lung disease, we should also avoid amiodarone.  Our only real options are tikosyn or sotalol.  Given reduced EF, I would prefer tikosyn if not cost prohibitive.  I will ask case management to review costs of sotalol and tikosyn with the patient today.  Hopefully, we can initiate AAD therapy this evening. The patient is currently in sinus rhythm.  Continue IV heparin as the patient is scheduled for cath on Monday.  He will require initiation of [place] therapy post cath.  If he has surgical disease, then MAZE and LAA removal at time of surgery should be considered. Increase coreg 25mg  BID  2. NSTEMI Secondary to AF with RVR Now pain free with sinus rhythm Continue IV heparin Given robust elevation of CMs, I would advise cath on Monday.  If he has surgical disease, then MAZE and LAA removal at time of surgery should be considered.  3. Hypertensive urgency with CHF and angina Now improved Continue medical therapy Increase coreg as above  4. Pulmonary fibrosis Clinically stable Avoid amiodarone if able   [name], MD 05/09/2018  1:41 PM

## 2018-05-09 NOTE — Progress Notes (Signed)
Nursing Note: NCM in with patient discussing medication options and price.  Patient would like opportunity to talk to wife later tonight.  He plans to let his primary RN know as soon as possible.  Currently replacing potassium per pharmacy.

## 2018-05-10 LAB — BASIC METABOLIC PANEL
Anion gap: 9 (ref 5–15)
BUN: 11 mg/dL (ref 8–23)
CO2: 25 mmol/L (ref 22–32)
Calcium: 8.9 mg/dL (ref 8.9–10.3)
Chloride: 105 mmol/L (ref 98–111)
Creatinine, Ser: 0.72 mg/dL (ref 0.61–1.24)
GFR calc Af Amer: >60 mL/min (ref >60)
GFR calc non Af Amer: >60 mL/min (ref >60)
Glucose, Bld: 87 mg/dL (ref 70–99)
Potassium: 3.7 mmol/L (ref 3.5–5.1)
Sodium: 139 mmol/L (ref 135–145)

## 2018-05-10 LAB — CBC
HCT: 42.2 % (ref 39.0–52.0)
Hemoglobin: 13.5 g/dL (ref 13.0–17.0)
MCH: 27.2 pg (ref 26.0–34.0)
MCHC: 32 g/dL (ref 30.0–36.0)
MCV: 85.1 fL (ref 80.0–100.0)
Platelets: 213 10*3/uL (ref 150–400)
RBC: 4.96 MIL/uL (ref 4.22–5.81)
RDW: 15.1 % (ref 11.5–15.5)
WBC: 17.1 10*3/uL — ABNORMAL HIGH (ref 4.0–10.5)
nRBC: 0 % (ref 0.0–0.2)

## 2018-05-10 LAB — MAGNESIUM: Magnesium: 1.9 mg/dL (ref 1.7–2.4)

## 2018-05-10 LAB — HEPARIN LEVEL (UNFRACTIONATED): Heparin Unfractionated: 0.66 IU/mL (ref 0.30–0.70)

## 2018-05-10 MED ORDER — POTASSIUM CHLORIDE CRYS ER 20 MEQ PO TBCR
40.0000 meq | EXTENDED_RELEASE_TABLET | Freq: Two times a day (BID) | ORAL | Status: DC
  Administered 2018-05-10 – 2018-05-13 (×7): 40 meq via ORAL
  Filled 2018-05-10 (×7): qty 2

## 2018-05-10 MED ORDER — MAGNESIUM SULFATE 2 GM/50ML IV SOLN
2.0000 g | Freq: Once | INTRAVENOUS | Status: AC
  Administered 2018-05-10: 2 g via INTRAVENOUS
  Filled 2018-05-10: qty 50

## 2018-05-10 MED ORDER — SODIUM CHLORIDE 0.9 % WEIGHT BASED INFUSION
3.0000 mL/kg/h | INTRAVENOUS | Status: DC
  Administered 2018-05-11: 3 mL/kg/h via INTRAVENOUS

## 2018-05-10 MED ORDER — SODIUM CHLORIDE 0.9% FLUSH
3.0000 mL | Freq: Two times a day (BID) | INTRAVENOUS | Status: DC
  Administered 2018-05-10: 3 mL via INTRAVENOUS

## 2018-05-10 MED ORDER — SODIUM CHLORIDE 0.9% FLUSH: 3.0000 mL | INTRAVENOUS | Status: DC | PRN

## 2018-05-10 MED ORDER — SODIUM CHLORIDE 0.9 % IV SOLN: 250.0000 mL | INTRAVENOUS | Status: DC | PRN

## 2018-05-10 MED ORDER — SODIUM CHLORIDE 0.9 % WEIGHT BASED INFUSION: 1.0000 mL/kg/h | INTRAVENOUS | Status: DC

## 2018-05-10 MED ORDER — SOTALOL HCL 120 MG PO TABS
120.0000 mg | ORAL_TABLET | Freq: Two times a day (BID) | ORAL | Status: DC
  Administered 2018-05-10: 120 mg via ORAL
  Filled 2018-05-10 (×3): qty 1

## 2018-05-10 NOTE — Progress Notes (Signed)
Progress Note   Subjective   Doing well today, the patient denies CP or SOB.  No new concerns  Inpatient Medications    Scheduled Meds: . [START ON 05/15/2018] Abatacept  125 mg Subcutaneous Q Fri  . aspirin EC  81 mg Oral Daily  . atorvastatin  80 mg Oral q1800  . azaTHIOprine  50 mg Oral BID  . carvedilol  25 mg Oral BID WC  . clopidogrel  75 mg Oral Daily  . furosemide  40 mg Oral BID  . losartan  100 mg Oral Daily  . pantoprazole  40 mg Oral BID AC  . potassium chloride  40 mEq Oral BID  . predniSONE  10 mg Oral Daily   Continuous Infusions: . heparin 1,200 Units/hr (05/10/18 0400)  . magnesium sulfate 1 - 4 g bolus IVPB    . nitroGLYCERIN     PRN Meds: acetaminophen, ondansetron (ZOFRAN) IV   Vital Signs    Vitals:   05/10/18 0400 05/10/18 0500 05/10/18 0656 05/10/18 0700  BP: 129/65 (!) 146/80 (!) 147/75 (!) 152/75  Pulse: (!) 51  (!) 57 66  Resp: 13 16  19   Temp: 98.7 F (37.1 C)   98.2 F (36.8 C)  TempSrc: Oral   Oral  SpO2: 96% 96%  96%  Weight:      Height:        Intake/Output Summary (Last 24 hours) at 05/10/2018 1018 Last data filed at 05/10/2018 0900 Gross per 24 hour  Intake 1218.79 ml  Output 2850 ml  Net -1631.21 ml   Filed Weights   05/09/18 0134 05/09/18 0200  Weight: 86.4 kg 89.8 kg    Telemetry    Sinus rhythm - Personally Reviewed  Physical Exam   GEN- The patient is well appearing, alert and oriented x 3 today.   Head- normocephalic, atraumatic Eyes-  Sclera clear, conjunctiva pink Ears- hearing intact Oropharynx- clear Neck- supple, Lungs- Clear to ausculation bilaterally, normal work of breathing Heart- Regular rate and rhythm  GI- soft, NT, ND, + BS Extremities- no clubbing, cyanosis, or edema  MS- no significant deformity or atrophy Skin- no rash or lesion Psych- euthymic mood, full affect Neuro- strength and sensation are intact   Labs    Chemistry Recent Labs  Lab 05/09/18 0545 05/09/18 2046  05/10/18 0323  NA 140 139 139  K 3.3* 4.3 3.7  CL 103 104 105  CO2 27 26 25   GLUCOSE 82 96 87  BUN 10 12 11   CREATININE 0.79 0.84 0.72  CALCIUM 9.2 9.2 8.9  PROT 5.8*  --   --   ALBUMIN 3.1*  --   --   AST 82*  --   --   ALT 29  --   --   ALKPHOS 54  --   --   BILITOT 0.8  --   --   GFRNONAA >60 >60 >60  GFRAA >60 >60 >60  ANIONGAP 10 9 9      Hematology Recent Labs  Lab 05/09/18 0545 05/10/18 0323  WBC 16.8* 17.1*  RBC 5.31 4.96  HGB 14.5 13.5  HCT 45.8 42.2  MCV 86.3 85.1  MCH 27.3 27.2  MCHC 31.7 32.0  RDW 14.8 15.1  PLT 252 213    Cardiac Enzymes Recent Labs  Lab 05/09/18 0545 05/09/18 1109  TROPONINI 13.51* 11.87*   No results for input(s): TROPIPOC in the last 168 hours.      Patient Profile   Curtis Flores is a  63 year old gentleman with CAD and inferolateral STEMI in 02/2015, ICM with EF 40-45%, pulmonary fibrosis, RA, atrial flutter s/p ablation 05/2015, prior tobacco abuse, HTN, HL, RA who presented to [facility] with NSTEMI in the setting of newly diagnosed afib with RVR.   Assessment & Plan    1.  Paroxysmal atrial flutter New onset Now back in sinus rhythm He has severe LA enlargement His only real options for AAD therapy are tikosyn and sotalol. He has reviewed risks, benefits, and costs of each and is clear in his decision to proceed with sotalol.  I will therefore start sotalol 120mg  BID.  Follow with serial ekgs.  Will need to remain in the hospital until mid day Tuesday (for 5 doses in hospital).  Continue IV heparin as the patient is scheduled for cath on Monday.  He will require initiation of [place] therapy post cath.  If he has surgical disease, then MAZE and LAA removal at time of surgery should be considered.**  2. NSTEMI Occurred in the setting of AF with RVR but with robust elevation in CMs On IV heparin Planned for cath on Monday I would recommend left heart catheterization with possible PCI.  Discussed the cath with the  patient. The patient understands that risks included but are not limited to stroke (1 in 1000), death (1 in 35), kidney failure [usually temporary] (1 in 500), bleeding (1 in 200), allergic reaction [possibly serious] (1 in 200). The patient understands and agrees to proceed.  If he has surgical disease, then MAZE and LAA removal at time of surgery should be considered. Stop nitroglycerine now  3. HTN Stable No change required today We discussed dietary sodium restriction  4. Pulmonary fibrosis Avoid amiodarone if able  General cardiology to follow EP PA to follow ekgs on sotalol with you  [name] MD, [facility] 05/10/2018 10:18 AM

## 2018-05-10 NOTE — Progress Notes (Signed)
ANTICOAGULATION CONSULT NOTE - follow up  [place] for heparin Indication: atrial fibrillation  No Known Allergies  Patient Measurements: Height: 5\' 7"  (170.2 cm) Weight: 198 lb (89.8 kg) IBW/kg (Calculated) : 66.1 Heparin Dosing Weight: 84.8 kg  Vital Signs: Temp: 98.2 F (36.8 C) (03/08 1106) Temp Source: Oral (03/08 1106) BP: 123/73 (03/08 1106) Pulse Rate: 60 (03/08 1106)  Labs: Recent Labs    05/09/18 0545 05/09/18 0902 05/09/18 1109 05/09/18 2046 05/10/18 0323  HGB 14.5  --   --   --  13.5  HCT 45.8  --   --   --  42.2  PLT 252  --   --   --  213  APTT 98*  --   --   --   --   LABPROT 14.7  --   --   --   --   INR 1.2  --   --   --   --   HEPARINUNFRC  --  0.71*  --   --  0.66  CREATININE 0.79  --   --  0.84 0.72  TROPONINI 13.51*  --  11.87*  --   --     Estimated Creatinine Clearance: 98.4 mL/min (by C-G formula based on SCr of 0.72 mg/dL).   Medical History: Past Medical History:  Diagnosis Date  . Abnormal CT scan, stomach   . Chronic systolic dysfunction of left ventricle   . Coronary artery disease    lateral STEMI 02/22/15  . GERD (gastroesophageal reflux disease)   . Hyperlipidemia   . Hypertension   . Ischemic cardiomyopathy   . LBBB (left bundle branch block)   . Leukocytosis    followed by hematology, reactive  . Lymphadenopathy   . Myocardial infarction ([place]) 02/2015  . Psoriasis 2003  . Psoriatic arthritis ([place])   . Pulmonary fibrosis ([place])   . Rheumatoid arthritis(714.0) 2012  . Typical atrial flutter ([facility])     Medications:  See med history  Assessment: 63 yo man transferred from OSH on heparin drip for afib.  He was given 4000 unit bolus and drip started at 1200 units/hr. Heparin level 0.66  this am CBC stable and no bleeding  Started sotalol for Afib watch electrolytes  Mag 1.9 replaced, K3.7  low replaced   Goal of Therapy:  Heparin level 0.3-0.7 units/ml Monitor platelets by anticoagulation  protocol: Yes   Plan:  Continue heparin at 1200 units/hr Check heparin level daily Daily HL and CBC Will change to oral AC post cath Kcl 21meq bid - on furosemide Daily Mag, bmet    Bonnita Nasuti Pharm.D. CPP, BCPS Clinical Pharmacist [phone] 05/10/2018 1:01 PM

## 2018-05-11 ENCOUNTER — Encounter (HOSPITAL_COMMUNITY)
Admission: AD | Disposition: A | Discharge status: Home / Self Care | Payer: Self-pay | Source: Other Acute Inpatient Hospital | Attending: Cardiology

## 2018-05-11 ENCOUNTER — Encounter (HOSPITAL_COMMUNITY): Payer: Self-pay | Admitting: Interventional Cardiology

## 2018-05-11 DIAGNOSIS — I2510 Atherosclerotic heart disease of native coronary artery without angina pectoris: Secondary | ICD-10-CM

## 2018-05-11 DIAGNOSIS — I428 Other cardiomyopathies: Secondary | ICD-10-CM

## 2018-05-11 DIAGNOSIS — I483 Typical atrial flutter: Secondary | ICD-10-CM

## 2018-05-11 DIAGNOSIS — Z9861 Coronary angioplasty status: Secondary | ICD-10-CM

## 2018-05-11 DIAGNOSIS — I4891 Unspecified atrial fibrillation: Secondary | ICD-10-CM

## 2018-05-11 HISTORY — PX: LEFT HEART CATH AND CORONARY ANGIOGRAPHY: CATH118249

## 2018-05-11 LAB — BASIC METABOLIC PANEL
Anion gap: 7 (ref 5–15)
BUN: 12 mg/dL (ref 8–23)
CO2: 26 mmol/L (ref 22–32)
Calcium: 8.8 mg/dL — ABNORMAL LOW (ref 8.9–10.3)
Chloride: 105 mmol/L (ref 98–111)
Creatinine, Ser: 0.74 mg/dL (ref 0.61–1.24)
GFR calc Af Amer: >60 mL/min (ref >60)
GFR calc non Af Amer: >60 mL/min (ref >60)
Glucose, Bld: 72 mg/dL (ref 70–99)
Potassium: 4.2 mmol/L (ref 3.5–5.1)
Sodium: 138 mmol/L (ref 135–145)

## 2018-05-11 LAB — HEPARIN LEVEL (UNFRACTIONATED)
Heparin Unfractionated: 0.8 IU/mL — ABNORMAL HIGH (ref 0.30–0.70)
Heparin Unfractionated: 1.02 IU/mL — ABNORMAL HIGH (ref 0.30–0.70)

## 2018-05-11 LAB — MAGNESIUM: Magnesium: 2.3 mg/dL (ref 1.7–2.4)

## 2018-05-11 LAB — GLUCOSE, CAPILLARY: GLUCOSE-CAPILLARY: 114 mg/dL — AB (ref 70–99)

## 2018-05-11 LAB — CBC
HCT: 41.8 % (ref 39.0–52.0)
Hemoglobin: 13.6 g/dL (ref 13.0–17.0)
MCH: 27.9 pg (ref 26.0–34.0)
MCHC: 32.5 g/dL (ref 30.0–36.0)
MCV: 85.7 fL (ref 80.0–100.0)
PLATELETS: 194 10*3/uL (ref 150–400)
RBC: 4.88 MIL/uL (ref 4.22–5.81)
RDW: 14.9 % (ref 11.5–15.5)
WBC: 15.1 10*3/uL — ABNORMAL HIGH (ref 4.0–10.5)
nRBC: 0 % (ref 0.0–0.2)

## 2018-05-11 SURGERY — LEFT HEART CATH AND CORONARY ANGIOGRAPHY
Anesthesia: LOCAL

## 2018-05-11 MED ORDER — IOHEXOL 350 MG/ML SOLN
INTRAVENOUS | Status: DC | PRN
  Administered 2018-05-11: 105 mL via INTRA_ARTERIAL

## 2018-05-11 MED ORDER — SODIUM CHLORIDE 0.9% FLUSH: 3.0000 mL | INTRAVENOUS | Status: DC | PRN

## 2018-05-11 MED ORDER — VERAPAMIL HCL 2.5 MG/ML IV SOLN
INTRAVENOUS | Status: AC
  Filled 2018-05-11: qty 2

## 2018-05-11 MED ORDER — LIDOCAINE HCL (PF) 1 % IJ SOLN
INTRAMUSCULAR | Status: AC
  Filled 2018-05-11: qty 30

## 2018-05-11 MED ORDER — ACETAMINOPHEN 325 MG PO TABS: 650.0000 mg | ORAL_TABLET | ORAL | Status: DC | PRN

## 2018-05-11 MED ORDER — MIDAZOLAM HCL 2 MG/2ML IJ SOLN
INTRAMUSCULAR | Status: AC
  Filled 2018-05-11: qty 2

## 2018-05-11 MED ORDER — OXYCODONE HCL 5 MG PO TABS: 5.0000 mg | ORAL_TABLET | ORAL | Status: DC | PRN

## 2018-05-11 MED ORDER — LIDOCAINE HCL (PF) 1 % IJ SOLN
INTRAMUSCULAR | Status: DC | PRN
  Administered 2018-05-11: 2 mL

## 2018-05-11 MED ORDER — NITROGLYCERIN 1 MG/10 ML FOR IR/CATH LAB
INTRA_ARTERIAL | Status: AC
  Filled 2018-05-11: qty 10

## 2018-05-11 MED ORDER — SOTALOL HCL 120 MG PO TABS
120.0000 mg | ORAL_TABLET | Freq: Two times a day (BID) | ORAL | Status: DC
  Administered 2018-05-11 – 2018-05-13 (×5): 120 mg via ORAL
  Filled 2018-05-11 (×6): qty 1

## 2018-05-11 MED ORDER — ONDANSETRON HCL 4 MG/2ML IJ SOLN: 4.0000 mg | Freq: Four times a day (QID) | INTRAMUSCULAR | Status: DC | PRN

## 2018-05-11 MED ORDER — SODIUM CHLORIDE 0.9 % IV SOLN: INTRAVENOUS | Status: AC

## 2018-05-11 MED ORDER — FENTANYL CITRATE (PF) 100 MCG/2ML IJ SOLN
INTRAMUSCULAR | Status: DC | PRN
  Administered 2018-05-11: 25 ug via INTRAVENOUS

## 2018-05-11 MED ORDER — HEPARIN SODIUM (PORCINE) 1000 UNIT/ML IJ SOLN
INTRAMUSCULAR | Status: DC | PRN
  Administered 2018-05-11: 4500 [IU] via INTRAVENOUS

## 2018-05-11 MED ORDER — NITROGLYCERIN 1 MG/10 ML FOR IR/CATH LAB
INTRA_ARTERIAL | Status: DC | PRN
  Administered 2018-05-11: 200 ug via INTRACORONARY

## 2018-05-11 MED ORDER — SODIUM CHLORIDE 0.9% FLUSH
3.0000 mL | Freq: Two times a day (BID) | INTRAVENOUS | Status: DC
  Administered 2018-05-11: 3 mL via INTRAVENOUS

## 2018-05-11 MED ORDER — SODIUM CHLORIDE 0.9 % IV SOLN: 250.0000 mL | INTRAVENOUS | Status: DC | PRN

## 2018-05-11 MED ORDER — VERAPAMIL HCL 2.5 MG/ML IV SOLN
INTRAVENOUS | Status: DC | PRN
  Administered 2018-05-11: 10 mL via INTRA_ARTERIAL

## 2018-05-11 MED ORDER — HEPARIN (PORCINE) 25000 UT/250ML-% IV SOLN
1250.0000 [IU]/h | INTRAVENOUS | Status: DC
  Administered 2018-05-11: 1100 [IU]/h via INTRAVENOUS
  Filled 2018-05-11: qty 250

## 2018-05-11 MED ORDER — MIDAZOLAM HCL 2 MG/2ML IJ SOLN
INTRAMUSCULAR | Status: DC | PRN
  Administered 2018-05-11: 1 mg via INTRAVENOUS

## 2018-05-11 MED ORDER — HEPARIN (PORCINE) IN NACL 1000-0.9 UT/500ML-% IV SOLN
INTRAVENOUS | Status: DC | PRN
  Administered 2018-05-11 (×2): 500 mL

## 2018-05-11 MED ORDER — HEPARIN SODIUM (PORCINE) 1000 UNIT/ML IJ SOLN
INTRAMUSCULAR | Status: AC
  Filled 2018-05-11: qty 1

## 2018-05-11 MED ORDER — HEPARIN (PORCINE) IN NACL 1000-0.9 UT/500ML-% IV SOLN
INTRAVENOUS | Status: AC
  Filled 2018-05-11: qty 1000

## 2018-05-11 MED ORDER — FENTANYL CITRATE (PF) 100 MCG/2ML IJ SOLN
INTRAMUSCULAR | Status: AC
  Filled 2018-05-11: qty 2

## 2018-05-11 SURGICAL SUPPLY — 10 items
CATH 5FR JL3.5 JR4 ANG PIG MP (CATHETERS) ×1 IMPLANT
DEVICE RAD COMP TR BAND LRG (VASCULAR PRODUCTS) ×2 IMPLANT
GLIDESHEATH SLEND A-KIT 6F 22G (SHEATH) ×1 IMPLANT
GUIDEWIRE INQWIRE 1.5J.035X260 (WIRE) IMPLANT
INQWIRE 1.5J .035X260CM (WIRE) ×2
KIT HEART LEFT (KITS) ×2 IMPLANT
PACK CARDIAC CATHETERIZATION (CUSTOM PROCEDURE TRAY) ×2 IMPLANT
SHEATH PROBE COVER 6X72 (BAG) ×1 IMPLANT
TRANSDUCER W/STOPCOCK (MISCELLANEOUS) ×2 IMPLANT
TUBING CIL FLEX 10 FLL-RA (TUBING) ×2 IMPLANT

## 2018-05-11 NOTE — H&P (View-Only) (Signed)
Sotalol monitoring visit Last PM dose not given 2/2 bradycardia telemetry is reviewed, SB 40's-50's No coreg since here has been given, will discontinue BMET this AM ordered  Discussed with Dr. Rayann Heman Will give sotalol 120mg  this AM, will f/u HR/EKG  Tommye Standard, PA-C

## 2018-05-11 NOTE — Plan of Care (Signed)

## 2018-05-11 NOTE — Progress Notes (Signed)
TCTS consulted for CABG evaluation. °

## 2018-05-11 NOTE — CV Procedure (Signed)
   Coronary angiography and left ventriculography right radial access achieved with real-time vascular ultrasound.  Diffuse in-stent restenosis of the proximal circumflex stent, 99%, extending to the circumflex ostium/distal left main.  Also moderate diffuse in-stent restenosis of the first obtuse marginal stent, 50%, which forms a "T" with the circumflex stent.  The circumflex is dominant, supplies the PDA and 4 obtuse marginal branches.  Moderate, heavily calcified, mid LAD, 50%.  First diagonal contains eccentric 80% ostial narrowing.  Distal LAD contains eccentric 70% stenosis.  Widely patent left main.  Nondominant right coronary with 60 to 70% mid stenosis  Globally reduced LV systolic function with EF 40%.  Discussed briefly with Dr. [name].  PCI could be performed on the ostial to proximal circumflex T stent with the first obtuse marginal although long-term patency will be compromised because of diffuse ISR and relatively small vessel diameter (2.25 mm stent).  Discussed with team concerning PCI versus surgery with grafting of circumflex, diagonal, question apical LAD along with maze procedure as an alternative treatment strategy.

## 2018-05-11 NOTE — Progress Notes (Signed)
ANTICOAGULATION CONSULT NOTE - follow up  Curtis Flores for heparin Indication: atrial fibrillation  No Known Allergies  Patient Measurements: Height: 5\' 7"  (170.2 cm) Weight: 192 lb 14.4 oz (87.5 kg) IBW/kg (Calculated) : 66.1 Heparin Dosing Weight: 84.8 kg  Vital Signs: Temp: 97.5 F (36.4 C) (03/09 0400) Temp Source: Oral (03/09 0400) BP: 153/84 (03/09 1130) Pulse Rate: 60 (03/09 1130)  Labs: Recent Labs    05/09/18 0545 05/09/18 0902 05/09/18 1109 05/09/18 2046 05/10/18 0323 05/11/18 0234  HGB 14.5  --   --   --  13.5 13.6  HCT 45.8  --   --   --  42.2 41.8  PLT 252  --   --   --  213 194  APTT 98*  --   --   --   --   --   LABPROT 14.7  --   --   --   --   --   INR 1.2  --   --   --   --   --   HEPARINUNFRC  --  0.71*  --   --  0.66 0.80*  CREATININE 0.79  --   --  0.84 0.72 0.74  TROPONINI 13.51*  --  11.87*  --   --   --     Estimated Creatinine Clearance: 97.3 mL/min (by C-G formula based on SCr of 0.74 mg/dL).   Medical History: Past Medical History:  Diagnosis Date  . Abnormal CT scan, stomach   . Chronic systolic dysfunction of left ventricle   . Coronary artery disease    lateral STEMI 02/22/15  . GERD (gastroesophageal reflux disease)   . Hyperlipidemia   . Hypertension   . Ischemic cardiomyopathy   . LBBB (left bundle branch block)   . Leukocytosis    followed by hematology, reactive  . Lymphadenopathy   . Myocardial infarction ([place]) 02/2015  . Psoriasis 2003  . Psoriatic arthritis ([place])   . Pulmonary fibrosis ([place])   . Rheumatoid arthritis(714.0) 2012  . Typical atrial flutter ([facility])      Assessment: [name] started on IV heparin for new onset AFib. Pt has hx of AFlutter treated with ablation, no OAC prior to admission. Pt now s/p cath with noted limited PCI options. Pharmacy to resume heparin 8hr while awaiting workup for CABG.   Goal of Therapy:  Heparin level 0.3-0.7 units/ml Monitor platelets by anticoagulation  protocol: Yes   Plan:  -Resume heparin 1100 units/hr at [place] with no bolus -Recheck 6hr heparin level   Curtis Flores, PharmD, BCPS Clinical Pharmacist [phone] Please check AMION for all [place] numbers 05/11/2018

## 2018-05-11 NOTE — Progress Notes (Addendum)
Progress Note  Patient Name: Curtis Flores Date of Encounter: 05/11/2018  Primary Cardiologist: No primary care provider on file.   Subjective   No chest pain. Remains in SR.   Inpatient Medications    Scheduled Meds: . [START ON 05/15/2018] Abatacept  125 mg Subcutaneous Q Fri  . aspirin EC  81 mg Oral Daily  . atorvastatin  80 mg Oral q1800  . azaTHIOprine  50 mg Oral BID  . clopidogrel  75 mg Oral Daily  . furosemide  40 mg Oral BID  . losartan  100 mg Oral Daily  . pantoprazole  40 mg Oral BID AC  . potassium chloride  40 mEq Oral BID  . predniSONE  10 mg Oral Daily  . sodium chloride flush  3 mL Intravenous Q12H  . sotalol  120 mg Oral Q12H   Continuous Infusions: . sodium chloride    . sodium chloride 1 mL/kg/hr (05/11/18 3818)  . heparin 1,100 Units/hr (05/11/18 0619)   PRN Meds: sodium chloride, acetaminophen, sodium chloride flush   Vital Signs    Vitals:   05/10/18 1944 05/10/18 2341 05/11/18 0400 05/11/18 0500  BP: (!) 130/59 (!) 149/68 (!) 147/71   Pulse: (!) 46 (!) 52    Resp:      Temp: 97.6 F (36.4 C) 97.7 F (36.5 C) (!) 97.5 F (36.4 C)   TempSrc: Oral Oral Oral   SpO2:  94%    Weight:    87.5 kg  Height:        Intake/Output Summary (Last 24 hours) at 05/11/2018 0939 Last data filed at 05/11/2018 0427 Gross per 24 hour  Intake 669.91 ml  Output 2300 ml  Net -1630.09 ml   Last 3 Weights 05/11/2018 05/09/2018 05/09/2018  Weight (lbs) 192 lb 14.4 oz 198 lb 190 lb 7.6 oz  Weight (kg) 87.5 kg 89.812 kg 86.4 kg      Telemetry    SR - Personally Reviewed  ECG    SR with PVCs - Personally Reviewed  Physical Exam   GEN: No acute distress.   Neck: No JVD Cardiac: RRR, no murmurs, rubs, or gallops.  Respiratory: Clear to auscultation bilaterally. GI: Soft, nontender, non-distended  MS: No edema; No deformity. Neuro:  Nonfocal  Psych: Normal affect   Labs    Chemistry Recent Labs  Lab 05/09/18 0545 05/09/18 2046 05/10/18 0323  05/11/18 0234  NA 140 139 139 138  K 3.3* 4.3 3.7 4.2  CL 103 104 105 105  CO2 27 26 25 26   GLUCOSE 82 96 87 72  BUN 10 12 11 12   CREATININE 0.79 0.84 0.72 0.74  CALCIUM 9.2 9.2 8.9 8.8*  PROT 5.8*  --   --   --   ALBUMIN 3.1*  --   --   --   AST 82*  --   --   --   ALT 29  --   --   --   ALKPHOS 54  --   --   --   BILITOT 0.8  --   --   --   GFRNONAA >60 >60 >60 >60  GFRAA >60 >60 >60 >60  ANIONGAP 10 9 9 7      Hematology Recent Labs  Lab 05/09/18 0545 05/10/18 0323 05/11/18 0234  WBC 16.8* 17.1* 15.1*  RBC 5.31 4.96 4.88  HGB 14.5 13.5 13.6  HCT 45.8 42.2 41.8  MCV 86.3 85.1 85.7  MCH 27.3 27.2 27.9  MCHC 31.7 32.0  32.5  RDW 14.8 15.1 14.9  PLT 252 213 194    Cardiac Enzymes Recent Labs  Lab 05/09/18 0545 05/09/18 1109  TROPONINI 13.51* 11.87*   No results for input(s): TROPIPOC in the last 168 hours.   BNP Recent Labs  Lab 05/09/18 0545  BNP 636.2*     DDimer No results for input(s): DDIMER in the last 168 hours.   Radiology    No results found.  Cardiac Studies   TTE: 05/09/2018   1. The left ventricle has mild-moderately reduced systolic function, with an ejection fraction of 40-45%. The cavity size was mildly dilated. There is mildly increased left ventricular wall thickness. There is severe hypokinesis of the basal  inferolateral myocardium. Grade 2 diastolic dysfunction.  2. The right ventricle has normal systolic function. The cavity was mildly enlarged. There is no increase in right ventricular wall thickness. Right ventricular systolic pressure is mildly elevated with an estimated pressure of 37.1 mmHg (decreased  compared to the prior study).  3. Left atrial size was moderately dilated.  4. The mitral valve is normal in structure.  5. The tricuspid valve is normal in structure.  6. The aortic valve is tricuspid.  7. The aortic root is normal in size and structure.  Patient Profile     63 y.o. male with CAD and inferolateral STEMI in  02/2015, ICM with EF 40-45%, pulmonary fibrosis, RA, atrial flutter s/p ablation 05/2015, prior tobacco abuse, HTN, HL, RA who presented to [facility] NSTEMI in the setting of newly diagnosed afib with RVR. Seen by EP and placed on Sotalol. Planned for cardiac cath.  Assessment & Plan    1.  Paroxysmal atrial flutter: new onset while at OSH. Remains in SR. Received Sotalol dose at 11am yesterday, but held last night. EP ok with giving dose this morning. Stopped Coreg. Will need [place] post cath. ChadsVasc of at least 4. Appears Eliquis/Xarelto about the same in cost.  -- on IV heparin until cath today  2. NSTEMI: Occurred in the setting of AF with RVR but with elevation in troponins. Peaked at 13.51. Planned for cath today. No recurrent chest pain.   3. HTN: -- on ARB, BB stopped given his bradycardia.   4. Pulmonary fibrosis: plans to avoid amiodarone.   5. RA: several outpatient medications. Will continue the same.  For questions or updates, please contact [place] Please consult [email or web address] for contact info under    Signed, Reino Bellis, NP  05/11/2018, 9:39 AM    Agree with note by Reino Bellis NP-C  [name] is my clinic patient.  I intervened on him back in 2016.  He had residual disease at that time treated medically.  He was admitted with a flutter with RVR converting to sinus rhythm currently on sotalol and heparin.  His enzymes were elevated.  He denies chest pain.  He scheduled for diagnostic coronary angiography today.  If he has intervention he will need to be on "triple therapy for 1 month after which aspirin can be discontinued.   Lorretta Harp, M.D., [place], [facility], [place]verta [place] [address] [place], [place]  [address]  [phone] 05/11/2018 11:00 AM

## 2018-05-11 NOTE — Progress Notes (Signed)
ANTICOAGULATION CONSULT NOTE - Follow Up Consult  Pharmacy Consult for heparin Indication: Afib and NSTEMI  Labs: Recent Labs    05/09/18 0545 05/09/18 0902 05/09/18 1109 05/09/18 2046 05/10/18 0323 05/11/18 0234  HGB 14.5  --   --   --  13.5 13.6  HCT 45.8  --   --   --  42.2 41.8  PLT 252  --   --   --  213 194  APTT 98*  --   --   --   --   --   LABPROT 14.7  --   --   --   --   --   INR 1.2  --   --   --   --   --   HEPARINUNFRC  --  0.71*  --   --  0.66 0.80*  CREATININE 0.79  --   --  0.84 0.72  --   TROPONINI 13.51*  --  11.87*  --   --   --     Assessment: 63yo male supratherapeutic on heparin after two levels at upper end of goal; no gtt issues or signs of bleeding per RN; plan for cath this am.  Goal of Therapy:  Heparin level 0.3-0.7 units/ml   Plan:  Will decrease heparin gtt by 1-2 units/kg/hr to 1100 units/hr and check level in 6 hours.    Wynona Neat, PharmD, BCPS  05/11/2018,5:39 AM

## 2018-05-11 NOTE — Progress Notes (Signed)
Sotalol monitoring visit Last PM dose not given 2/2 bradycardia telemetry is reviewed, SB 40's-50's No coreg since here has been given, will discontinue BMET this AM ordered  Discussed with Dr. Rayann Heman Will give sotalol 120mg  this AM, will f/u HR/EKG  Tommye Standard, PA-C

## 2018-05-11 NOTE — Consult Note (Signed)
[address]       [place] [address]             [phone]        Curtis Flores [place] Medical Record #[id number] Date of Birth: 10-Jun-1955  Referring: Dr Tamala Julian Primary Care: Dione Housekeeper, MD Primary Cardiologist:Dr [name]  Chief Complaint:   Fast heart rate and SOB  History of Present Illness:    Patient is a 63 year old male with known coronary occlusive disease having had acute inferolateral myocardial infarction in 2016, he has been followed by the pulmonary service for progressive pulmonary fibrosis and arthritis, currently on Imuran and steroids.  Over the past several years his pulmonary fibrosis has progressed both on a functional status and radiographically.  Since November 2019 did notice his pulmonary symptoms worsening, notes that he gets short of breath just doing minor things such as tying his shoes.  In November he had acute exacerbation of pneumonia requiring hospitalization and home oxygen.  Currently is not using oxygen at home.  He has been followed by Dr. 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 HTN, H.  He presented to [facility] NSTEMI in the setting of newly diagnosed afib with RVR. Seen by EP and placed on Sotalol.   Recent pulmonary 03/20/2018 with 15% drop in pulmonary function and increased sterids and imuran PFTs  FEV!  52% DLCO 41 %     Current Activity/ Functional Status: Patient is independent with mobility/ambulation, transfers, ADL's, IADL's.   Zubrod Score: At the time of surgery this patient's most appropriate activity status/level should be described as: []     0    Normal activity, no symptoms []     1    Restricted in physical strenuous activity but ambulatory, able to do out light work [x]     2    Ambulatory and capable of self care, unable  to do work activities, up and about                 more than 50%  Of the time                            []     3    Only limited self care, in bed greater than 50% of waking hours []     4    Completely disabled, no self care, confined to bed or chair []     5    Moribund  Past Medical History:  Diagnosis Date  . Abnormal CT scan, stomach   . Chronic systolic dysfunction of left ventricle   . Coronary artery disease    lateral STEMI 02/22/15  . GERD (gastroesophageal reflux disease)   . Hyperlipidemia   . Hypertension   . Ischemic cardiomyopathy   . LBBB (left bundle branch block)   . Leukocytosis    followed by hematology, reactive  . Lymphadenopathy   . Myocardial infarction ([place]) 02/2015  . Psoriasis 2003  . Psoriatic arthritis ([place])   . Pulmonary fibrosis ([place])   . Rheumatoid arthritis(714.0) 2012  . Typical atrial flutter [facility])     Past Surgical History:  Procedure Laterality Date  . CARDIAC CATHETERIZATION N/A 02/22/2015   Procedure: Left Heart Cath and Coronary Angiography;  Surgeon: Lorretta Harp, MD;  Location: [place] CV LAB;  Service: Cardiovascular;  Laterality: N/A;  . CARDIAC CATHETERIZATION N/A 02/22/2015   Procedure: Coronary Stent Intervention;  Surgeon: Lorretta Harp, MD;  Location: [place] CV LAB;  Service: Cardiovascular;  Laterality: N/A;  . CARDIOVERSION N/A 04/12/2015   Procedure: CARDIOVERSION;  Surgeon: Larey Dresser, MD;  Location: [facility] ENDOSCOPY;  Service: Cardiovascular;  Laterality: N/A;  . COLONOSCOPY  07/01/2003   TIW:PYKDXI colonic mucosa except for the proximal right colon in the area of ICV which was not seen completely due to inadequate bowel prep. followed with ACBE which was normal.   . COLONOSCOPY N/A 08/24/2015   Dr. Gala Romney: Normal colon. Next colonoscopy in 10 years.  [name] ELECTROPHYSIOLOGIC STUDY N/A 05/30/2015   Atrial fibrillation ablation by Dr Rayann Heman  . ESOPHAGOGASTRODUODENOSCOPY N/A 08/24/2015   Dr. Gala Romney: Medium-sized hiatal  hernia, erosive gastropathy. Cameron lesions. Esophageal mucosa distally suggestive of short segment Barrett's esophagus. Not confirmed on biopsy. Gastric biopsy with minimal chronic inflammation  . GIVENS CAPSULE STUDY N/A 04/17/2016   Procedure: GIVENS CAPSULE STUDY;  Surgeon: Daneil Dolin, MD;  [facility];  Service: Endoscopy;  Laterality: N/A;  Pt to arrive at 8:00 am for 8:30 am appt  . LEFT HEART CATH AND CORONARY ANGIOGRAPHY N/A 05/11/2018   Procedure: LEFT HEART CATH AND CORONARY ANGIOGRAPHY;  Surgeon: Belva Crome, MD;  Location: [place] CV LAB;  Service: Cardiovascular;  Laterality: N/A;  . TEE WITHOUT CARDIOVERSION N/A 04/12/2015   Procedure: TRANSESOPHAGEAL ECHOCARDIOGRAM (TEE);  Surgeon: Larey Dresser, MD;  Location: [facility] ENDOSCOPY;  Service: Cardiovascular;  Laterality: N/A;    Social History   Tobacco Use  Smoking Status Former Smoker  . Packs/day: 1.00  . Years: 30.00  . Pack years: 30.00  . Types: Cigarettes  . Last attempt to quit: 03/21/2003  . Years since quitting: 15.1  Smokeless Tobacco Never Used    Social History   Substance and Sexual Activity  Alcohol Use No  . Alcohol/week: 0.0 standard drinks   Comment: H/O case of beer weekly x 20 years, quiting in 2000-ish.     No Known Allergies  Current Facility-Administered Medications  Medication Dose Route Frequency Provider Last Rate Last Dose  . 0.9 %  sodium chloride infusion   Intravenous Continuous Belva Crome, MD      . 0.9 %  sodium chloride infusion  250 mL Intravenous PRN Belva Crome, MD      . Derrill Memo ON 05/15/2018] Abatacept SOAJ 125 mg  125 mg Subcutaneous Q Fri Smith, Henry W, MD      . acetaminophen (TYLENOL) tablet 650 mg  650 mg Oral Q4H PRN Belva Crome, MD      . aspirin EC tablet 81 mg  81 mg Oral Daily Belva Crome, MD   81 mg at 05/11/18 1224  . atorvastatin (LIPITOR) tablet 80 mg  80 mg Oral q1800 Belva Crome, MD   80 mg at 05/10/18 2126  . azaTHIOprine (IMURAN)  tablet 50 mg  50 mg Oral BID Belva Crome, MD   50 mg at 05/11/18 1223  . clopidogrel (PLAVIX) tablet 75 mg  75 mg Oral Daily Belva Crome, MD   75 mg at 05/11/18 1222  . furosemide (LASIX) tablet 40 mg  40 mg Oral BID Belva Crome, MD   40 mg at 05/11/18 1223  . heparin ADULT infusion 100 units/mL (25000 units/257mL sodium chloride 0.45%)  1,100 Units/hr Intravenous Continuous  Einar Grad, [facility]      . losartan (COZAAR) tablet 100 mg  100 mg Oral Daily Belva Crome, MD   100 mg at 05/11/18 1223  . oxyCODONE (Oxy IR/ROXICODONE) immediate release tablet 5-10 mg  5-10 mg Oral Q4H PRN Belva Crome, MD      . pantoprazole (PROTONIX) EC tablet 40 mg  40 mg Oral BID AC Belva Crome, MD   40 mg at 05/11/18 [id number]  . potassium chloride SA (K-DUR,KLOR-CON) CR tablet 40 mEq  40 mEq Oral BID Belva Crome, MD   40 mEq at 05/11/18 1223  . predniSONE (DELTASONE) tablet 10 mg  10 mg Oral Daily Belva Crome, MD   10 mg at 05/11/18 1224  . sodium chloride flush (NS) 0.9 % injection 3 mL  3 mL Intravenous Q12H Belva Crome, MD      . sodium chloride flush (NS) 0.9 % injection 3 mL  3 mL Intravenous PRN Belva Crome, MD      . sotalol (BETAPACE) tablet 120 mg  120 mg Oral Q12H Belva Crome, MD   120 mg at 05/11/18 1223    Medications Prior to Admission  Medication Sig Dispense Refill Last Dose  . Abatacept (ORENCIA CLICKJECT) 341 MG/ML SOAJ Inject 125 mg into the skin once a week. (Patient taking differently: Inject 125 mg into the skin once a week. Every Friday) 12 Syringe 0 05/08/2018 at Unknown time  . atorvastatin (LIPITOR) 80 MG tablet Take 1 tablet (80 mg total) by mouth daily at 6 PM. 90 tablet 1 05/07/2018  . azaTHIOprine (IMURAN) 50 MG tablet Take 1 tablet (50 mg total) by mouth 2 (two) times daily. 180 tablet 0 05/08/2018 at Unknown time  . carvedilol (COREG) 12.5 MG tablet Take 1.5 tablets (18.75 mg total) by mouth 2 (two) times daily with a meal. 270 tablet 1 05/08/2018 at 0730  .  Clobetasol Prop Emollient Base (CLOBETASOL PROPIONATE E) 0.05 % emollient cream Apply to affected area BID (Patient taking differently: Apply 1 application topically daily. ) 30 g 2 05/09/2018 at Unknown time  . clopidogrel (PLAVIX) 75 MG tablet Take 1 tablet (75 mg total) by mouth daily. 90 tablet 1 05/08/2018 at Unknown time  . furosemide (LASIX) 40 MG tablet Take 1 tablet (40 mg total) by mouth 2 (two) times daily. 180 tablet 1 05/08/2018 at Unknown time  . losartan (COZAAR) 100 MG tablet Take 1 tablet (100 mg total) by mouth daily. 90 tablet 1 05/08/2018 at Unknown time  . pantoprazole (PROTONIX) 40 MG tablet Take 1 tablet (40 mg total) by mouth 2 (two) times daily before a meal. 180 tablet 2 05/08/2018 at Unknown time  . azithromycin (ZITHROMAX) 250 MG tablet Take 1 tablet (250 mg total) by mouth daily. (Patient not taking: Reported on 05/09/2018) 3 each 0 Not Taking at Unknown time    Family History  Problem Relation Age of Onset  . Hypertension Mother   . Colon cancer Neg Hx      Review of Systems:   Pertinent items are noted in HPI.     Cardiac Review of Systems: Y or  [    ]= no  Chest Pain [ y   ]  Resting SOB [ y  ] Exertional SOB  Blue.Reese  ]  Vertell Limber Florencio.Farrier  ]   Pedal Edema [ y  ]    Palpitations [ y ] Syncope  [ n ]   Presyncope [  n ]  General Review of Systems: [Y] = yes [  ]=no Constitional: recent weight change [  ]; anorexia [  ]; fatigue [  ]; nausea [  ]; night sweats [  ]; fever [  ]; or chills [  ]                                                               Dental: Last Dentist visit:   Eye : blurred vision [  ]; diplopia [   ]; vision changes [  ];  Amaurosis fugax[  ]; Resp: cough Blue.Reese  ];  wheezing[  ];  hemoptysis[  ]; shortness of breath[  y]; paroxysmal nocturnal dyspnea[  y; dyspnea on exertion[ y ]; or orthopnea[  ];  GI:  gallstones[  ], vomiting[  ];  dysphagia[  ]; melena[  ];  hematochezia [  ]; heartburn[  ];   Hx of  Colonoscopy[  ]; GU: kidney stones [  ]; hematuria[  ];    dysuria [  ];  nocturia[  ];  history of     obstruction [  ]; urinary frequency [  ]             Skin: rash, swelling[  ];, hair loss[  ];  peripheral edema[  ];  or itching[  ]; Musculosketetal: myalgias[  ];  joint swelling[  ];  joint erythema[  ];  joint pain[  ];  back pain[  ];  Heme/Lymph: bruising[  ];  bleeding[  ];  anemia[  ];  Neuro: TIA[  ];  headaches[  ];  stroke[  ];  vertigo[  ];  seizures[  ];   paresthesias[  ];  difficulty walking[  ];  Psych:depression[  ]; anxiety[  ];  Endocrine: diabetes[  ];  thyroid dysfunction[  ];              Physical Exam: BP (!) 118/58   Pulse 60   Temp 98.6 F (37 C) (Oral)   Resp 20   Ht 5\' 7"  (1.702 m)   Wt 87.5 kg   SpO2 100%   BMI 30.21 kg/m    General appearance: alert, cooperative and no distress Head: Normocephalic, without obvious abnormality, atraumatic Neck: no adenopathy, no carotid bruit, no JVD, supple, symmetrical, trachea midline and thyroid not enlarged, symmetric, no tenderness/mass/nodules Lymph nodes: Cervical, supraclavicular, and axillary nodes normal. Resp: diminished breath sounds bilaterally Back: symmetric, no curvature. ROM normal. No CVA tenderness. Cardio: regular rate and rhythm, S1, S2 normal, no murmur, click, rub or gallop and With PACs GI: soft, non-tender; bowel sounds normal; no masses,  no organomegaly Extremities: extremities normal, atraumatic, no cyanosis or edema and Homans sign is negative, no sign of DVT Neurologic: Grossly normal  Diagnostic Studies & Laboratory data:     Recent Radiology :  Chest xrays this admission and jan 220 consistent with pulmonary fibrosis   CT two years ago: CLINICAL DATA:  63 year old male with possible wall fibrosis noted on outside prior chest x-ray. Evaluate for interstitial lung disease. Recent history of cough which has since resolved. No shortness of breath. Prior history of smoking.  EXAM: CT CHEST WITHOUT  CONTRAST  TECHNIQUE: Multidetector CT imaging of the chest was performed following the standard protocol  without intravenous contrast. High resolution imaging of the lungs, as well as inspiratory and expiratory imaging, was performed.  COMPARISON:  High-resolution chest CT 04/09/2011.  FINDINGS: Mediastinum/Lymph Nodes: Heart size is mildly enlarged. There is no significant pericardial fluid, thickening or pericardial calcification. There is atherosclerosis of the thoracic aorta, the great vessels of the mediastinum and the coronary arteries, including calcified atherosclerotic plaque in the left main, left anterior descending, left circumflex and right coronary arteries. Multiple borderline enlarged and minimally enlarged mediastinal and hilar lymph nodes, measuring up to 1 cm in the subcarinal nodal station, similar to the prior study. Esophagus is unremarkable in appearance. No axillary lymphadenopathy.  Lungs/Pleura: Again noted are patchy areas of ground-glass attenuation and septal thickening scattered throughout the lungs bilaterally. These findings are most evident in the lower lobes of the lungs, in a similar distribution to the prior study. The majority of the areas demonstrate sparing of the immediate subpleural interstitium. Extensive bronchiolectasis and microcystic honeycombing is noted throughout these areas. No definite macrocystic honeycombing noted. Some scattered very mild cylindrical bronchiectasis is noted. Overall, the burden of fibrotic changes in the lungs demonstrates minimal progression compared to the prior examination. The Inspiratory and expiratory imaging is unremarkable. No acute consolidative airspace disease. Trace left pleural effusion lying dependently.  Upper abdomen: Unremarkable.  Musculoskeletal: There are no aggressive appearing lytic or blastic lesions noted in the visualized portions of the skeleton.  IMPRESSION: 1. Appearance  of the lungs remains compatible with interstitial lung disease. The overall appearance is again favored to reflect fibrotic phase nonspecific interstitial pneumonia (NSIP), however, slight progression compared to the prior study is somewhat unusual. If there is clinical concern for potential usual interstitial pneumonia (UIP), open lung biopsy could be considered for further diagnostic evaluation. Sparing of the immediate subpleural interstitium would be highly unusual in the setting of UIP, however. 2. Mild cardiomegaly. 3. Atherosclerosis, including left main and 3 vessel coronary artery disease. Please note that although the presence of coronary artery calcium documents the presence of coronary artery disease, the severity of this disease and any potential stenosis cannot be assessed on this non-gated CT examination. Assessment for potential risk factor modification, dietary therapy or pharmacologic therapy may be warranted, if clinically indicated.   Electronically Signed   By: Vinnie Langton M.D.   On: 05/12/2015 17:09 I have independently reviewed the above radiologic studies and discussed with the patient   Recent Lab Findings: Lab Results  Component Value Date   WBC 15.1 (H) 05/11/2018   HGB 13.6 05/11/2018   HCT 41.8 05/11/2018   PLT 194 05/11/2018   GLUCOSE 72 05/11/2018   CHOL 130 05/09/2018   TRIG 40 05/09/2018   HDL 51 05/09/2018   LDLCALC 71 05/09/2018   ALT 29 05/09/2018   AST 82 (H) 05/09/2018   NA 138 05/11/2018   K 4.2 05/11/2018   CL 105 05/11/2018   CREATININE 0.74 05/11/2018   BUN 12 05/11/2018   CO2 26 05/11/2018   TSH 2.689 05/09/2018   INR 1.2 05/09/2018   HGBA1C 5.6 05/09/2018   Previous AFIB ablation: PREPROCEDURE DIAGNOSIS:  1. Typical Atrial flutter.  2. LBBB  POSTPROCEDURE DIAGNOSIS: 1. Typical Atrial flutter.  2. LBBB  PROCEDURES:  1. Comprehensive electrophysiology study.  2. Coronary sinus pacing and recording.  3.  Mapping of atrial flutter.  5. Ablation of atrial flutter.  6. Arrhythmia induction with isuprel and pacing.    CATH: 09/01/624 AO Systolic Pressure 948 mmHg  AO Diastolic Pressure  53 mmHg  AO Mean 76 mmHg  LV Systolic Pressure 258 mmHg  LV Diastolic Pressure 6 mmHg  LV EDP 16 mmHg  AOp Systolic Pressure 527 mmHg  AOp Diastolic Pressure 47 mmHg  AOp Mean Pressure 77 mmHg  LVp Systolic Pressure 782 mmHg  LVp Diastolic Pressure 8 mmHg  LVp EDP Pressure 21 mmHg    Left dominant coronary anatomy.  Heavily calcified LAD and circumflex.  Widely patent left main.  LAD is heavily calcified and contains eccentric 50 to 70% proximal narrowing, 70 to 75% apical narrowing, and there is eccentric 90% obstruction in the first diagonal which is a large vessel.  The previously stented proximal circumflex and first obtuse marginal, with T stent technique, demonstrates diffuse severe, 99%, ISR from the ostium of the circumflex into the proximal vessel beyond the bifurcation with the first obtuse marginal.  There is moderate in-stent restenosis in the first obtuse marginal of 50%.  The right coronary is nondominant.  The right coronary contains 60 to 70% mid vessel narrowing and after the acute marginal branch 60% narrowing.  Global left ventricular dysfunction with EF 35 to 40%.  RECOMMENDATIONS:   Discuss treatment strategy which could include PCI of the circumflex, excepting probable lower long-term patency and need for repeat intervention.  Alternatively CABG with Maze could be considered.  I have not discontinued the patient's Plavix.  Will discuss with team.  Discussed with Dr. [name].  ECHO: Patient Name:   Curtis Flores [facility] Date of Exam: 05/09/2018 Medical Rec #:  [id number]      Height:       67.0 in Accession #:    [id number]     Weight:       198.0 lb Date of Birth:  02-13-1956       BSA:          2.01 m Patient Age:    41 years       BP:           130/82 mmHg Patient Gender: M               HR:           69 bpm. Exam Location:  Inpatient    Procedure: 2D Echo  Indications:    Chest pain R07.9   History:        Patient has prior history of Echocardiogram examinations, most                 recent 12/16/2017. CAD and Previous Myocardial Infarction,                 Atrial Flutter; Risk Factors: Hypertension and Dyslipidemia.   Sonographer:    Mikki Santee RDCS (AE) Referring Phys: [id number] [place]    1. The left ventricle has mild-moderately reduced systolic function, with an ejection fraction of 40-45%. The cavity size was mildly dilated. There is mildly increased left ventricular wall thickness. There is severe hypokinesis of the basal  inferolateral myocardium. Grade 2 diastolic dysfunction.  2. The right ventricle has normal systolic function. The cavity was mildly enlarged. There is no increase in right ventricular wall thickness. Right ventricular systolic pressure is mildly elevated with an estimated pressure of 37.1 mmHg (decreased  compared to the prior study).  3. Left atrial size was moderately dilated.  4. The mitral valve is normal in structure.  5. The tricuspid valve is normal in structure.  6. The aortic valve is tricuspid.  7.  The aortic root is normal in size and structure.  FINDINGS  Left Ventricle: The left ventricle has mild-moderately reduced systolic function, with an ejection fraction of 40-45%. The cavity size was mildly dilated. There is mildly increased left ventricular wall thickness. Left ventricular diastolic Doppler  parameters are consistent with pseudonormalization Right Ventricle: The right ventricle has normal systolic function. The cavity was mildly enlarged. There is no increase in right ventricular wall thickness. Right ventricular systolic pressure is mildly elevated with an estimated pressure of 37.1 mmHg. Left Atrium: left atrial size was moderately dilated Right Atrium: right atrial size was  mild-moderately dilated. Right atrial pressure is estimated at 3 mmHg. Interatrial Septum: No atrial level shunt detected by color flow Doppler. Pericardium: There is no evidence of pericardial effusion. Mitral Valve: The mitral valve is normal in structure. Mitral valve regurgitation is trivial by color flow Doppler. Tricuspid Valve: The tricuspid valve is normal in structure. Tricuspid valve regurgitation is trivial by color flow Doppler. Aortic Valve: The aortic valve is tricuspid Aortic valve regurgitation was not visualized by color flow Doppler. Pulmonic Valve: The pulmonic valve was grossly normal. Pulmonic valve regurgitation is not visualized by color flow Doppler. Aorta: The aortic root is normal in size and structure. Venous: The inferior vena cava is normal in size with greater than 50% respiratory variability.   LEFT VENTRICLE PLAX 2D ([place]) LV EF:          59.7 %   Diastology LVIDd:          5.60 cm  LV e' lateral:   7.72 cm/s LVIDs:          3.80 cm  LV E/e' lateral: 10.3 LV PW:          1.00 cm  LV e' medial:    4.79 cm/s LV IVS:         1.10 cm  LV E/e' medial:  16.7 LVOT diam:      2.30 cm LV SV:          92 ml LVOT Area:      4.15 cm  RIGHT VENTRICLE RV S prime:     12.90 cm/s TAPSE (M-mode): 2.0 cm RVSP:           37.1 mmHg  LEFT ATRIUM              Index       RIGHT ATRIUM           Index LA diam:        4.70 cm  2.33 cm/m  RA Pressure: 3 mmHg LA Vol (A2C):   84.8 ml  42.11 ml/m RA Area:     24.00 cm LA Vol (A4C):   105.0 ml 52.14 ml/m RA Volume:   87.10 ml  43.26 ml/m LA Biplane Vol: 97.5 ml  48.42 ml/m    AORTA Ao Root diam: 3.00 cm  MITRAL VALVE              TRICUSPID VALVE MV Area (PHT): 4.15 cm   TR Peak grad:   34.1 mmHg MV PHT:        53.07 msec TR Vmax:        292.00 cm/s MV Decel Time: 183 msec   RVSP:           37.1 mmHg MV E velocity: 79.90 cm/s MV A velocity: 57.00 cm/s MV E/A ratio:  1.40    Rozann Lesches MD Electronically  signed by Rozann Lesches MD Signature  Date/Time: 05/09/2018/5:20:38 PM   Lab Results  Component Value Date   TROPONINI 11.87 ([place]) 05/09/2018    Assessment / Plan:   #1 coronary occlusive disease, with ischemic cardiomyopathy akinesis of inferolateral ventricle, poor quality distal vessels especially the right-question whether the patient would benefit from viability study, as his primary disease involves the circumflex distribution, but this is also the area that it appears most profoundly akinetic.  #2 significant pulmonary disease with radiographic changes consistent with moderate to severe pulmonary fibrosis-very symptomatic with dyspnea with minimal activity has been on home oxygen on and off-significant diffusion deficit has been demonstrated to be progressive recently. #3 intermittent atrial fibrillation atrial flutter  The patient would from a pulmonary standpoint with his underlying pulmonary fibrosis, and being on Imuran and prednisone coronary artery bypass grafting would carry significant respiratory risk, with decompensation of his respiratory status postoperatively resulting poor outcome due to respiratory failure.  The patient nor his wife wish to pursue coronary artery bypass grafting.  After discussion with Dr. Tamala Julian a had decided to discuss stent placement.     I  spent 60 minutes counseling the patient face to face.   Grace Isaac MD      [address] [place], [address] Office [phone]   Beeper [phone]  05/11/2018 2:05 PM

## 2018-05-11 NOTE — Interval H&P Note (Signed)
Cath Lab Visit (complete for each Cath Lab visit)  Clinical Evaluation Leading to the Procedure:   ACS: Yes.    Non-ACS:    Anginal Classification: CCS III  Anti-ischemic medical therapy: Minimal Therapy (1 class of medications)  Non-Invasive Test Results: No non-invasive testing performed  Prior CABG: No previous CABG      History and Physical Interval Note:  05/11/2018 10:49 AM  [name]  has presented today for surgery, with the diagnosis of NSTEMI.  The various methods of treatment have been discussed with the patient and family. After consideration of risks, benefits and other options for treatment, the patient has consented to  Procedure(s): LEFT HEART CATH AND CORONARY ANGIOGRAPHY (N/A) as a surgical intervention.  The patient's history has been reviewed, patient examined, no change in status, stable for surgery.  I have reviewed the patient's chart and labs.  Questions were answered to the patient's satisfaction.     [name]

## 2018-05-12 ENCOUNTER — Encounter (HOSPITAL_COMMUNITY): Payer: Self-pay | Admitting: Interventional Cardiology

## 2018-05-12 ENCOUNTER — Encounter (HOSPITAL_COMMUNITY)
Admission: AD | Disposition: A | Discharge status: Home / Self Care | Payer: Self-pay | Source: Other Acute Inpatient Hospital | Attending: Cardiology

## 2018-05-12 HISTORY — PX: CORONARY BALLOON ANGIOPLASTY: CATH118233

## 2018-05-12 LAB — CBC
HCT: 43.9 % (ref 39.0–52.0)
Hemoglobin: 13.9 g/dL (ref 13.0–17.0)
MCH: 27 pg (ref 26.0–34.0)
MCHC: 31.7 g/dL (ref 30.0–36.0)
MCV: 85.4 fL (ref 80.0–100.0)
Platelets: 210 10*3/uL (ref 150–400)
RBC: 5.14 MIL/uL (ref 4.22–5.81)
RDW: 15 % (ref 11.5–15.5)
WBC: 15 10*3/uL — ABNORMAL HIGH (ref 4.0–10.5)
nRBC: 0 % (ref 0.0–0.2)

## 2018-05-12 LAB — BASIC METABOLIC PANEL
ANION GAP: 8 (ref 5–15)
BUN: 12 mg/dL (ref 8–23)
CO2: 25 mmol/L (ref 22–32)
Calcium: 9.2 mg/dL (ref 8.9–10.3)
Chloride: 105 mmol/L (ref 98–111)
Creatinine, Ser: 0.67 mg/dL (ref 0.61–1.24)
GFR calc Af Amer: >60 mL/min (ref >60)
GFR calc non Af Amer: >60 mL/min (ref >60)
GLUCOSE: 82 mg/dL (ref 70–99)
Potassium: 4.6 mmol/L (ref 3.5–5.1)
Sodium: 138 mmol/L (ref 135–145)

## 2018-05-12 LAB — POCT ACTIVATED CLOTTING TIME
Activated Clotting Time: 345 seconds
Activated Clotting Time: 384 seconds
Activated Clotting Time: 384 seconds

## 2018-05-12 LAB — HEPARIN LEVEL (UNFRACTIONATED)
Heparin Unfractionated: <0.1 IU/mL — ABNORMAL LOW (ref 0.30–0.70)
Heparin Unfractionated: <0.1 IU/mL — ABNORMAL LOW (ref 0.30–0.70)

## 2018-05-12 LAB — MAGNESIUM: Magnesium: 2.1 mg/dL (ref 1.7–2.4)

## 2018-05-12 SURGERY — CORONARY BALLOON ANGIOPLASTY
Anesthesia: LOCAL

## 2018-05-12 MED ORDER — HYDRALAZINE HCL 20 MG/ML IJ SOLN
INTRAMUSCULAR | Status: DC | PRN
  Administered 2018-05-12 (×2): 10 mg via INTRAVENOUS

## 2018-05-12 MED ORDER — SODIUM CHLORIDE 0.9 % WEIGHT BASED INFUSION: 1.0000 mL/kg/h | INTRAVENOUS | Status: AC

## 2018-05-12 MED ORDER — SODIUM CHLORIDE 0.9% FLUSH: 3.0000 mL | INTRAVENOUS | Status: DC | PRN

## 2018-05-12 MED ORDER — MIDAZOLAM HCL 2 MG/2ML IJ SOLN
INTRAMUSCULAR | Status: DC | PRN
  Administered 2018-05-12: 0.5 mg via INTRAVENOUS

## 2018-05-12 MED ORDER — FENTANYL CITRATE (PF) 100 MCG/2ML IJ SOLN
INTRAMUSCULAR | Status: AC
  Filled 2018-05-12: qty 2

## 2018-05-12 MED ORDER — FENTANYL CITRATE (PF) 100 MCG/2ML IJ SOLN
INTRAMUSCULAR | Status: DC | PRN
  Administered 2018-05-12: 25 ug via INTRAVENOUS

## 2018-05-12 MED ORDER — HEPARIN (PORCINE) 25000 UT/250ML-% IV SOLN
1300.0000 [IU]/h | INTRAVENOUS | Status: DC
  Administered 2018-05-12: 1400 [IU]/h via INTRAVENOUS
  Filled 2018-05-12: qty 250

## 2018-05-12 MED ORDER — HEPARIN (PORCINE) IN NACL 1000-0.9 UT/500ML-% IV SOLN
INTRAVENOUS | Status: AC
  Filled 2018-05-12: qty 1000

## 2018-05-12 MED ORDER — CLOPIDOGREL BISULFATE 75 MG PO TABS: 75.0000 mg | ORAL_TABLET | Freq: Every day | ORAL | Status: DC

## 2018-05-12 MED ORDER — SODIUM CHLORIDE 0.9 % WEIGHT BASED INFUSION: 1.0000 mL/kg/h | INTRAVENOUS | Status: DC

## 2018-05-12 MED ORDER — SODIUM CHLORIDE 0.9 % IV SOLN
INTRAVENOUS | Status: AC | PRN
  Administered 2018-05-12: 87.5 mL/h via INTRAVENOUS

## 2018-05-12 MED ORDER — HYDRALAZINE HCL 20 MG/ML IJ SOLN
INTRAMUSCULAR | Status: AC
  Filled 2018-05-12: qty 1

## 2018-05-12 MED ORDER — SODIUM CHLORIDE 0.9 % WEIGHT BASED INFUSION: 3.0000 mL/kg/h | INTRAVENOUS | Status: DC

## 2018-05-12 MED ORDER — HEPARIN SODIUM (PORCINE) 1000 UNIT/ML IJ SOLN
INTRAMUSCULAR | Status: DC | PRN
  Administered 2018-05-12: 9000 [IU] via INTRAVENOUS

## 2018-05-12 MED ORDER — HEPARIN SODIUM (PORCINE) 5000 UNIT/ML IJ SOLN: 5000.0000 [IU] | Freq: Three times a day (TID) | INTRAMUSCULAR | Status: DC

## 2018-05-12 MED ORDER — ACETAMINOPHEN 325 MG PO TABS: 650.0000 mg | ORAL_TABLET | ORAL | Status: DC | PRN

## 2018-05-12 MED ORDER — LABETALOL HCL 5 MG/ML IV SOLN: 10.0000 mg | INTRAVENOUS | Status: AC | PRN

## 2018-05-12 MED ORDER — SODIUM CHLORIDE 0.9 % IV SOLN: 250.0000 mL | INTRAVENOUS | Status: DC | PRN

## 2018-05-12 MED ORDER — VERAPAMIL HCL 2.5 MG/ML IV SOLN
INTRAVENOUS | Status: DC | PRN
  Administered 2018-05-12: 10 mL via INTRA_ARTERIAL

## 2018-05-12 MED ORDER — CLOPIDOGREL BISULFATE 75 MG PO TABS
ORAL_TABLET | ORAL | Status: AC
  Filled 2018-05-12: qty 2

## 2018-05-12 MED ORDER — SODIUM CHLORIDE 0.9% FLUSH
3.0000 mL | Freq: Two times a day (BID) | INTRAVENOUS | Status: DC
  Administered 2018-05-13: 3 mL via INTRAVENOUS

## 2018-05-12 MED ORDER — HYDRALAZINE HCL 20 MG/ML IJ SOLN: 5.0000 mg | INTRAMUSCULAR | Status: AC | PRN

## 2018-05-12 MED ORDER — NITROGLYCERIN 1 MG/10 ML FOR IR/CATH LAB
INTRA_ARTERIAL | Status: AC
  Filled 2018-05-12: qty 10

## 2018-05-12 MED ORDER — CLOPIDOGREL BISULFATE 300 MG PO TABS
ORAL_TABLET | ORAL | Status: DC | PRN
  Administered 2018-05-12: 150 mg via ORAL

## 2018-05-12 MED ORDER — ONDANSETRON HCL 4 MG/2ML IJ SOLN: 4.0000 mg | Freq: Four times a day (QID) | INTRAMUSCULAR | Status: DC | PRN

## 2018-05-12 MED ORDER — LIDOCAINE HCL (PF) 1 % IJ SOLN
INTRAMUSCULAR | Status: DC | PRN
  Administered 2018-05-12: 2 mL

## 2018-05-12 MED ORDER — HEPARIN (PORCINE) IN NACL 1000-0.9 UT/500ML-% IV SOLN
INTRAVENOUS | Status: AC
  Filled 2018-05-12: qty 500

## 2018-05-12 MED ORDER — ASPIRIN 81 MG PO CHEW: 81.0000 mg | CHEWABLE_TABLET | Freq: Every day | ORAL | Status: DC

## 2018-05-12 MED ORDER — AMLODIPINE BESYLATE 5 MG PO TABS
5.0000 mg | ORAL_TABLET | Freq: Every day | ORAL | Status: DC
  Administered 2018-05-12 – 2018-05-13 (×2): 5 mg via ORAL
  Filled 2018-05-12 (×2): qty 1

## 2018-05-12 MED ORDER — HEPARIN SODIUM (PORCINE) 1000 UNIT/ML IJ SOLN
INTRAMUSCULAR | Status: AC
  Filled 2018-05-12: qty 1

## 2018-05-12 MED ORDER — IOHEXOL 350 MG/ML SOLN
INTRAVENOUS | Status: DC | PRN
  Administered 2018-05-12: 180 mL via INTRACARDIAC

## 2018-05-12 MED ORDER — HEPARIN (PORCINE) IN NACL 1000-0.9 UT/500ML-% IV SOLN
INTRAVENOUS | Status: DC | PRN
  Administered 2018-05-12 (×2): 500 mL

## 2018-05-12 MED ORDER — VERAPAMIL HCL 2.5 MG/ML IV SOLN
INTRAVENOUS | Status: AC
  Filled 2018-05-12: qty 2

## 2018-05-12 MED ORDER — LIDOCAINE HCL (PF) 1 % IJ SOLN
INTRAMUSCULAR | Status: AC
  Filled 2018-05-12: qty 30

## 2018-05-12 MED ORDER — MIDAZOLAM HCL 2 MG/2ML IJ SOLN
INTRAMUSCULAR | Status: AC
  Filled 2018-05-12: qty 2

## 2018-05-12 SURGICAL SUPPLY — 20 items
BALLN SAPPHIRE 2.0X12 (BALLOONS) ×2
BALLN SAPPHIRE 2.0X15 (BALLOONS) ×2
BALLN WOLVERINE 2.50X15 (BALLOONS) ×2
BALLOON SAPPHIRE 2.0X12 (BALLOONS) ×1 IMPLANT
BALLOON SAPPHIRE 2.0X15 (BALLOONS) IMPLANT
BALLOON WOLVERINE 2.50X15 (BALLOONS) ×1 IMPLANT
CATH VISTA GUIDE 6FR XB3.5 (CATHETERS) ×1 IMPLANT
DEVICE RAD COMP TR BAND LRG (VASCULAR PRODUCTS) ×2 IMPLANT
ELECT DEFIB PAD ADLT CADENCE (PAD) ×1 IMPLANT
GLIDESHEATH SLEND A-KIT 6F 22G (SHEATH) ×1 IMPLANT
GUIDEWIRE INQWIRE 1.5J.035X260 (WIRE) IMPLANT
INQWIRE 1.5J .035X260CM (WIRE) ×2
KIT ENCORE 26 ADVANTAGE (KITS) ×1 IMPLANT
KIT HEART LEFT (KITS) ×2 IMPLANT
PACK CARDIAC CATHETERIZATION (CUSTOM PROCEDURE TRAY) ×2 IMPLANT
SHEATH PROBE COVER 6X72 (BAG) ×2 IMPLANT
TRANSDUCER W/STOPCOCK (MISCELLANEOUS) ×2 IMPLANT
TUBING CIL FLEX 10 FLL-RA (TUBING) ×2 IMPLANT
WIRE ASAHI PROWATER 180CM (WIRE) ×2 IMPLANT
WIRE SION BLUE 180 (WIRE) ×2 IMPLANT

## 2018-05-12 NOTE — Progress Notes (Addendum)
Progress Note  Patient Name: Curtis Flores Date of Encounter: 05/12/2018  Primary Cardiologist: Dr. [name]  Subjective   No CP, palpitations or SOB, no dizzy spells, weakness, or symptoms of brady.  Hasn't been up around much yet  Inpatient Medications    Scheduled Meds: . [START ON 05/15/2018] Abatacept  125 mg Subcutaneous Q Fri  . aspirin EC  81 mg Oral Daily  . atorvastatin  80 mg Oral q1800  . azaTHIOprine  50 mg Oral BID  . clopidogrel  75 mg Oral Daily  . furosemide  40 mg Oral BID  . losartan  100 mg Oral Daily  . pantoprazole  40 mg Oral BID AC  . potassium chloride  40 mEq Oral BID  . predniSONE  10 mg Oral Daily  . sodium chloride flush  3 mL Intravenous Q12H  . sotalol  120 mg Oral Q12H   Continuous Infusions: . sodium chloride    . heparin 1,250 Units/hr (05/12/18 0600)   PRN Meds: sodium chloride, acetaminophen, oxyCODONE, sodium chloride flush   Vital Signs    Vitals:   05/11/18 1647 05/11/18 2017 05/11/18 2350 05/12/18 0514  BP: (!) 120/50 132/86 (!) 159/84 (!) 159/77  Pulse:  (!) 50 (!) 57 (!) 50  Resp:   (!) 24 16  Temp: 97.7 F (36.5 C) (!) 97.3 F (36.3 C) (!) 97.4 F (36.3 C) 97.8 F (36.6 C)  TempSrc: Oral Oral Oral Oral  SpO2:   99% 100%  Weight:      Height:        Intake/Output Summary (Last 24 hours) at 05/12/2018 0717 Last data filed at 05/12/2018 0600 Gross per 24 hour  Intake 596.55 ml  Output 2100 ml  Net -1503.45 ml   Last 3 Weights 05/11/2018 05/09/2018 05/09/2018  Weight (lbs) 192 lb 14.4 oz 198 lb 190 lb 7.6 oz  Weight (kg) 87.5 kg 89.812 kg 86.4 kg      Telemetry    SB/SR 40's-60's, occ PVCs, rare PVC, one 4 beat NSVT - Personally Reviewed  ECG    SB 51bpm, LBBB, QTc is OK - Personally Reviewed  Physical Exam   GEN: No acute distress.   Neck: No JVD Cardiac: RRR, bradycardic, no murmurs, rubs, or gallops.  Respiratory:  CTA b/l. GI: Soft, nontender, non-distended  MS: No edema; No deformity. Neuro:  Nonfocal    Psych: Normal affect   Labs    Chemistry Recent Labs  Lab 05/09/18 0545  05/10/18 0323 05/11/18 0234 05/12/18 0212  NA 140   < > 139 138 138  K 3.3*   < > 3.7 4.2 4.6  CL 103   < > 105 105 105  CO2 27   < > 25 26 25   GLUCOSE 82   < > 87 72 82  BUN 10   < > 11 12 12   CREATININE 0.79   < > 0.72 0.74 0.67  CALCIUM 9.2   < > 8.9 8.8* 9.2  PROT 5.8*  --   --   --   --   ALBUMIN 3.1*  --   --   --   --   AST 82*  --   --   --   --   ALT 29  --   --   --   --   ALKPHOS 54  --   --   --   --   BILITOT 0.8  --   --   --   --  GFRNONAA >60   < > >60 >60 >60  GFRAA >60   < > >60 >60 >60  ANIONGAP 10   < > 9 7 8    < > = values in this interval not displayed.     Hematology Recent Labs  Lab 05/10/18 0323 05/11/18 0234 05/12/18 0212  WBC 17.1* 15.1* 15.0*  RBC 4.96 4.88 5.14  HGB 13.5 13.6 13.9  HCT 42.2 41.8 43.9  MCV 85.1 85.7 85.4  MCH 27.2 27.9 27.0  MCHC 32.0 32.5 31.7  RDW 15.1 14.9 15.0  PLT 213 194 210    Cardiac Enzymes Recent Labs  Lab 05/09/18 0545 05/09/18 1109  TROPONINI 13.51* 11.87*   No results for input(s): TROPIPOC in the last 168 hours.   BNP Recent Labs  Lab 05/09/18 0545  BNP 636.2*     DDimer No results for input(s): DDIMER in the last 168 hours.   Radiology    No results found.  Cardiac Studies    05/11/2018: LHC  Left dominant coronary anatomy.  Heavily calcified LAD and circumflex.  Widely patent left main.  LAD is heavily calcified and contains eccentric 50 to 70% proximal narrowing, 70 to 75% apical narrowing, and there is eccentric 90% obstruction in the first diagonal which is a large vessel.  The previously stented proximal circumflex and first obtuse marginal, with T stent technique, demonstrates diffuse severe, 99%, ISR from the ostium of the circumflex into the proximal vessel beyond the bifurcation with the first obtuse marginal.  There is moderate in-stent restenosis in the first obtuse marginal of 50%.  The right  coronary is nondominant.  The right coronary contains 60 to 70% mid vessel narrowing and after the acute marginal branch 60% narrowing.  Global left ventricular dysfunction with EF 35 to 40%.  RECOMMENDATIONS:  Discuss treatment strategy which could include PCI of the circumflex, excepting probable lower long-term patency and need for repeat intervention.  Alternatively CABG with M[place] could be considered.  I have not discontinued the patient's Plavix.  Will discuss with team.  Discussed with Dr. [name].   05/09/2018: TTE IMPRESSIONS  1. The left ventricle has mild-moderately reduced systolic function, with an ejection fraction of 40-45%. The cavity size was mildly dilated. There is mildly increased left ventricular wall thickness. There is severe hypokinesis of the basal  inferolateral myocardium. Grade 2 diastolic dysfunction.  2. The right ventricle has normal systolic function. The cavity was mildly enlarged. There is no increase in right ventricular wall thickness. Right ventricular systolic pressure is mildly elevated with an estimated pressure of 37.1 mmHg (decreased  compared to the prior study).  3. Left atrial size was moderately dilated.  4. The mitral valve is normal in structure.  5. The tricuspid valve is normal in structure.  6. The aortic valve is tricuspid.  7. The aortic root is normal in size and structure.  FINDINGS  Left Ventricle: The left ventricle has mild-moderately reduced systolic function, with an ejection fraction of 40-45%. The cavity size was mildly dilated. There is mildly increased left ventricular wall thickness. Left ventricular diastolic Doppler  parameters are consistent with pseudonormalization Right Ventricle: The right ventricle has normal systolic function. The cavity was mildly enlarged. There is no increase in right ventricular wall thickness. Right ventricular systolic pressure is mildly elevated with an estimated pressure of 37.1 mmHg. Left Atrium:  left atrial size was moderately dilated Right Atrium: right atrial size was mild-moderately dilated. Right atrial pressure is estimated at 3 mmHg. Interatrial  Septum: No atrial level shunt detected by color flow Doppler. Pericardium: There is no evidence of pericardial effusion. Mitral Valve: The mitral valve is normal in structure. Mitral valve regurgitation is trivial by color flow Doppler. Tricuspid Valve: The tricuspid valve is normal in structure. Tricuspid valve regurgitation is trivial by color flow Doppler. Aortic Valve: The aortic valve is tricuspid Aortic valve regurgitation was not visualized by color flow Doppler. Pulmonic Valve: The pulmonic valve was grossly normal. Pulmonic valve regurgitation is not visualized by color flow Doppler. Aorta: The aortic root is normal in size and structure. Venous: The inferior vena cava is normal in size with greater than 50% respiratory variability.     Patient Profile     63 y.o. male w/PMHx of HTN, HLD, CAD, pulmonary fibrosis, AFlutter (ablated 2017), LBBB, ICM, RA, sought attention for progressive severe chest pain for which he presented to [facility] ED and was found to have afib with RVR.  He had hypertensive urgency and NSTEMI.  He was transferred to [facility] for further evaluation  Assessment & Plan    1. CP/NSTEMI     S/p LHC yesterday noted above     CTS evaluation noted, increased risk for CABG, particularly given pulmonary issue     Await decision/plan from cardiology team if planned for PCI, ?viability study  2. PAFib w/RVR     CHA2DSVasc 3, on heparin gtt     Started on sotalol     BMET looks OK     QTc stable     Remains SB 40's-60's     Has ambulated minimally in the room, to the sink, washed up without symptoms of brady       For questions or updates, please contact [place] Please consult [email or web address] for contact info under        Signed, Baldwin [place], PA-C  05/12/2018, 7:17 AM    I have seen,  examined the patient, and reviewed the above assessment and plan.  Changes to above are made where necessary.  On exam, RRR.  QT is stable with sotalol.  Hopefully home soon. Follow-up in 3-5 days in the AF clinic post discharge.  Co Sign: [name], MD 05/12/2018 11:43 PM

## 2018-05-12 NOTE — Interval H&P Note (Signed)
Cath Lab Visit (complete for each Cath Lab visit)  Clinical Evaluation Leading to the Procedure:   ACS: Yes.    Non-ACS:    Anginal Classification: CCS Flores  Anti-ischemic medical therapy: Minimal Therapy (1 class of medications)  Non-Invasive Test Results: No non-invasive testing performed  Prior CABG: No previous CABG      History and Physical Interval Note:  05/12/2018 12:28 PM  Curtis Flores  has presented today for surgery, with the diagnosis of cad.  The various methods of treatment have been discussed with the patient and family. After consideration of risks, benefits and other options for treatment, the patient has consented to  Procedure(s): CORONARY STENT INTERVENTION (N/A) as a surgical intervention.  The patient's history has been reviewed, patient examined, no change in status, stable for surgery.  I have reviewed the patient's chart and labs.  Questions were answered to the patient's satisfaction.     Curtis Flores

## 2018-05-12 NOTE — Progress Notes (Signed)
ANTICOAGULATION CONSULT NOTE   Pharmacy Consult for Heparin Indication: atrial fibrillation, s/p cath  No Known Allergies  Patient Measurements: Height: 5\' 7"  (170.2 cm) Weight: 192 lb 14.4 oz (87.5 kg) IBW/kg (Calculated) : 66.1 Heparin Dosing Weight: 84.8 kg  Vital Signs: Temp: 98.2 F (36.8 C) (03/10 1411) Temp Source: Oral (03/10 1411) BP: 132/57 (03/10 1411) Pulse Rate: 56 (03/10 1411)  Labs: Recent Labs    05/10/18 0323 05/11/18 0234 05/11/18 1239 05/12/18 0212 05/12/18 1133  HGB 13.5 13.6  --  13.9  --   HCT 42.2 41.8  --  43.9  --   PLT 213 194  --  210  --   HEPARINUNFRC 0.66 0.80* 1.02* <0.10* <0.10*  CREATININE 0.72 0.74  --  0.67  --     Estimated Creatinine Clearance: 97.3 mL/min (by C-G formula based on SCr of 0.67 mg/dL).   Medical History: Past Medical History:  Diagnosis Date  . Abnormal CT scan, stomach   . Chronic systolic dysfunction of left ventricle   . Coronary artery disease    lateral STEMI 02/22/15  . GERD (gastroesophageal reflux disease)   . Hyperlipidemia   . Hypertension   . Ischemic cardiomyopathy   . LBBB (left bundle branch block)   . Leukocytosis    followed by hematology, reactive  . Lymphadenopathy   . Myocardial infarction ([place]) 02/2015  . Psoriasis 2003  . Psoriatic arthritis ([place])   . Pulmonary fibrosis ([place])   . Rheumatoid arthritis(714.0) 2012  . Typical atrial flutter (HCC)      Assessment: 62 yoM started on IV heparin for new onset AFib. Pt has hx of AFlutter treated with ablation, no OAC prior to admission. Pt now s/p cath with noted limited PCI options. Pharmacy to resume heparin 8hr while awaiting workup for CABG.  Pt deemed not CABG candidate, now s/p staged PCI to restart IV heparin tonight 8hr after sheath pull. Previously subtherapeutic on 1250 units/hr this morning. Planning to start Eliquis tomorrow.  Goal of Therapy:  Heparin level 0.3-0.7 units/ml Monitor platelets by anticoagulation protocol:  Yes   Plan:  -Heparin 1400 units/hr starting at 2230 tonight -Recheck 8hr heparin level in am  Arrie Senate, PharmD, BCPS Clinical Pharmacist [phone] Please check AMION for all [place] numbers 05/12/2018

## 2018-05-12 NOTE — Interval H&P Note (Signed)
History and Physical Interval Note:  05/12/2018 12:29 PM   Procedure discussed and identified as high risk.  Curtis Flores  has presented today for surgery, with the diagnosis of cad.  The various methods of treatment have been discussed with the patient and family. After consideration of risks, benefits and other options for treatment, the patient has consented to  Procedure(s): CORONARY STENT INTERVENTION (N/A) as a surgical intervention.  The patient's history has been reviewed, patient examined, no change in status, stable for surgery.  I have reviewed the patient's chart and labs.  Questions were answered to the patient's satisfaction.     [name]

## 2018-05-12 NOTE — Progress Notes (Signed)
ANTICOAGULATION CONSULT NOTE   Pharmacy Consult for Heparin Indication: atrial fibrillation, s/p cath  No Known Allergies  Patient Measurements: Height: 5\' 7"  (170.2 cm) Weight: 192 lb 14.4 oz (87.5 kg) IBW/kg (Calculated) : 66.1 Heparin Dosing Weight: 84.8 kg  Vital Signs: Temp: 97.4 F (36.3 C) (03/09 2350) Temp Source: Oral (03/09 2350) BP: 159/84 (03/09 2350) Pulse Rate: 57 (03/09 2350)  Labs: Recent Labs    05/09/18 0545  05/09/18 1109  05/10/18 0323 05/11/18 0234 05/11/18 1239 05/12/18 0212  HGB 14.5  --   --   --  13.5 13.6  --  13.9  HCT 45.8  --   --   --  42.2 41.8  --  43.9  PLT 252  --   --   --  213 194  --  210  APTT 98*  --   --   --   --   --   --   --   LABPROT 14.7  --   --   --   --   --   --   --   INR 1.2  --   --   --   --   --   --   --   HEPARINUNFRC  --    < >  --   --  0.66 0.80* 1.02* <0.10*  CREATININE 0.79  --   --    < > 0.72 0.74  --  0.67  TROPONINI 13.51*  --  11.87*  --   --   --   --   --    < > = values in this interval not displayed.    Estimated Creatinine Clearance: 97.3 mL/min (by C-G formula based on SCr of 0.67 mg/dL).   Medical History: Past Medical History:  Diagnosis Date  . Abnormal CT scan, stomach   . Chronic systolic dysfunction of left ventricle   . Coronary artery disease    lateral STEMI 02/22/15  . GERD (gastroesophageal reflux disease)   . Hyperlipidemia   . Hypertension   . Ischemic cardiomyopathy   . LBBB (left bundle branch block)   . Leukocytosis    followed by hematology, reactive  . Lymphadenopathy   . Myocardial infarction ([place]) 02/2015  . Psoriasis 2003  . Psoriatic arthritis ([place])   . Pulmonary fibrosis ([place])   . Rheumatoid arthritis(714.0) 2012  . Typical atrial flutter (HCC)      Assessment: 70 yoM started on IV heparin for new onset AFib. Pt has hx of AFlutter treated with ablation, no OAC prior to admission. Pt now s/p cath with noted limited PCI options. Pharmacy to resume heparin  8hr while awaiting workup for CABG.   3/10 AM update: heparin level undetectable after re-start s/p cath, no plans for CABG, may go back to cath lab, no issues per RN.   Goal of Therapy:  Heparin level 0.3-0.7 units/ml Monitor platelets by anticoagulation protocol: Yes   Plan:  -Inc heparin to 1250 units/hr -Re-check heparin level in 6-8 hours  Narda Bonds, PharmD, [place] Pharmacist Phone: [phone]

## 2018-05-12 NOTE — H&P (View-Only) (Signed)
Progress Note  Patient Name: Curtis Flores Date of Encounter: 05/12/2018  Primary Cardiologist: Dr. Quay Burow  Subjective   No chest pain. Remains in SR.   Inpatient Medications    Scheduled Meds: . [START ON 05/15/2018] Abatacept  125 mg Subcutaneous Q Fri  . aspirin EC  81 mg Oral Daily  . atorvastatin  80 mg Oral q1800  . azaTHIOprine  50 mg Oral BID  . clopidogrel  75 mg Oral Daily  . furosemide  40 mg Oral BID  . losartan  100 mg Oral Daily  . pantoprazole  40 mg Oral BID AC  . potassium chloride  40 mEq Oral BID  . predniSONE  10 mg Oral Daily  . sodium chloride flush  3 mL Intravenous Q12H  . sotalol  120 mg Oral Q12H   Continuous Infusions: . sodium chloride    . heparin 1,250 Units/hr (05/12/18 0900)   PRN Meds: sodium chloride, acetaminophen, oxyCODONE, sodium chloride flush   Vital Signs    Vitals:   05/11/18 2017 05/11/18 2350 05/12/18 0514 05/12/18 0746  BP: 132/86 (!) 159/84 (!) 159/77   Pulse: (!) 50 (!) 57 (!) 50 (!) 48  Resp:  (!) 24 16   Temp: (!) 97.3 F (36.3 C) (!) 97.4 F (36.3 C) 97.8 F (36.6 C) 98.7 F (37.1 C)  TempSrc: Oral Oral Oral Oral  SpO2:  99% 100% 98%  Weight:      Height:        Intake/Output Summary (Last 24 hours) at 05/12/2018 0947 Last data filed at 05/12/2018 0900 Gross per 24 hour  Intake 821.55 ml  Output 2100 ml  Net -1278.45 ml   Last 3 Weights 05/11/2018 05/09/2018 05/09/2018  Weight (lbs) 192 lb 14.4 oz 198 lb 190 lb 7.6 oz  Weight (kg) 87.5 kg 89.812 kg 86.4 kg      Telemetry    SR - Personally Reviewed  ECG    SR with PVCs - Personally Reviewed  Physical Exam   GEN: No acute distress.   Neck: No JVD Cardiac: RRR, no murmurs, rubs, or gallops.  Respiratory: Clear to auscultation bilaterally. GI: Soft, nontender, non-distended  MS: No edema; No deformity. Neuro:  Nonfocal  Psych: Normal affect   Labs    Chemistry Recent Labs  Lab 05/09/18 0545  05/10/18 0323 05/11/18 0234  05/12/18 0212  NA 140   < > 139 138 138  K 3.3*   < > 3.7 4.2 4.6  CL 103   < > 105 105 105  CO2 27   < > 25 26 25   GLUCOSE 82   < > 87 72 82  BUN 10   < > 11 12 12   CREATININE 0.79   < > 0.72 0.74 0.67  CALCIUM 9.2   < > 8.9 8.8* 9.2  PROT 5.8*  --   --   --   --   ALBUMIN 3.1*  --   --   --   --   AST 82*  --   --   --   --   ALT 29  --   --   --   --   ALKPHOS 54  --   --   --   --   BILITOT 0.8  --   --   --   --   GFRNONAA >60   < > >60 >60 >60  GFRAA >60   < > >60 >60 >60  ANIONGAP  10   < > 9 7 8    < > = values in this interval not displayed.     Hematology Recent Labs  Lab 05/10/18 0323 05/11/18 0234 05/12/18 0212  WBC 17.1* 15.1* 15.0*  RBC 4.96 4.88 5.14  HGB 13.5 13.6 13.9  HCT 42.2 41.8 43.9  MCV 85.1 85.7 85.4  MCH 27.2 27.9 27.0  MCHC 32.0 32.5 31.7  RDW 15.1 14.9 15.0  PLT 213 194 210    Cardiac Enzymes Recent Labs  Lab 05/09/18 0545 05/09/18 1109  TROPONINI 13.51* 11.87*   No results for input(s): TROPIPOC in the last 168 hours.   BNP Recent Labs  Lab 05/09/18 0545  BNP 636.2*     DDimer No results for input(s): DDIMER in the last 168 hours.   Radiology    No results found.  Cardiac Studies   TTE: 05/09/2018   1. The left ventricle has mild-moderately reduced systolic function, with an ejection fraction of 40-45%. The cavity size was mildly dilated. There is mildly increased left ventricular wall thickness. There is severe hypokinesis of the basal  inferolateral myocardium. Grade 2 diastolic dysfunction.  2. The right ventricle has normal systolic function. The cavity was mildly enlarged. There is no increase in right ventricular wall thickness. Right ventricular systolic pressure is mildly elevated with an estimated pressure of 37.1 mmHg (decreased  compared to the prior study).  3. Left atrial size was moderately dilated.  4. The mitral valve is normal in structure.  5. The tricuspid valve is normal in structure.  6. The aortic  valve is tricuspid.  7. The aortic root is normal in size and structure.  Patient Profile     63 y.o. male with CAD and inferolateral STEMI in 02/2015, ICM with EF 40-45%, pulmonary fibrosis, RA, atrial flutter s/p ablation 05/2015, prior tobacco abuse, HTN, HL, RA who presented to [place] NSTEMI in the setting of newly diagnosed afib with RVR. Seen by EP and placed on Sotalol.  Cardiac catheterization performed yesterday showed in-stent restenosis within the proximal circumflex and first obtuse marginal branch T stent.  He had only mild disease in his LAD and small RCA.  He was seen by Dr. Servando Snare for surgical consultation who felt he was not an ideal operative candidate because of his baseline pulmonary status and pulmonary fibrosis.  Assessment & Plan    1.  Paroxysmal atrial flutter: new onset while at OSH. Remains in SR. Received Sotalol dose at 11am yesterday, but held last night. EP ok with giving dose this morning. Stopped Coreg. Will need [place] post cath. ChadsVasc of at least 4. Appears Eliquis/Xarelto about the same in cost.  -- on IV heparin until cath today  2. NSTEMI: Occurred in the setting of AF with RVR but with elevation in troponins. Peaked at 13.51.  Cardiac catheterization performed yesterday by Dr. Tamala Julian revealed severe in-stent restenosis within the proximal circumflex and obtuse marginal branch T stent.  He was seen by Dr. Servando Snare in consultation for consideration of bypass grafting who felt he was not an optimal bypass candidate because of his underlying pulmonary fibrosis.  3. HTN:-- on ARB, BB stopped given his bradycardia.   4. Pulmonary fibrosis: plans to avoid amiodarone.   5. RA: several outpatient medications. Will continue the same.   We will review his options with Dr. Tamala Julian.  Suspect he will need a temporizing percutaneous procedure.  He remains on IV heparin.   For questions or updates, please contact [place]  Please consult  [email or web address] for contact info under    Lorretta Harp, M.D., [place], [facility], [place], [place] [address] [place], [place]  [address]  [phone] 05/12/2018 9:49 AM

## 2018-05-12 NOTE — Progress Notes (Signed)
Back from the cath lab awake and alert. TR Band to right wrist intact., elevated with pillow. Pulse ox to right thumb in placed.

## 2018-05-12 NOTE — Progress Notes (Signed)
Transported to the cath lab by bed. 

## 2018-05-12 NOTE — Progress Notes (Signed)
Progress Note  Patient Name: Curtis Flores Date of Encounter: 05/12/2018  Primary Cardiologist: Dr. Quay Burow  Subjective   No chest pain. Remains in SR.   Inpatient Medications    Scheduled Meds: . [START ON 05/15/2018] Abatacept  125 mg Subcutaneous Q Fri  . aspirin EC  81 mg Oral Daily  . atorvastatin  80 mg Oral q1800  . azaTHIOprine  50 mg Oral BID  . clopidogrel  75 mg Oral Daily  . furosemide  40 mg Oral BID  . losartan  100 mg Oral Daily  . pantoprazole  40 mg Oral BID AC  . potassium chloride  40 mEq Oral BID  . predniSONE  10 mg Oral Daily  . sodium chloride flush  3 mL Intravenous Q12H  . sotalol  120 mg Oral Q12H   Continuous Infusions: . sodium chloride    . heparin 1,250 Units/hr (05/12/18 0900)   PRN Meds: sodium chloride, acetaminophen, oxyCODONE, sodium chloride flush   Vital Signs    Vitals:   05/11/18 2017 05/11/18 2350 05/12/18 0514 05/12/18 0746  BP: 132/86 (!) 159/84 (!) 159/77   Pulse: (!) 50 (!) 57 (!) 50 (!) 48  Resp:  (!) 24 16   Temp: (!) 97.3 F (36.3 C) (!) 97.4 F (36.3 C) 97.8 F (36.6 C) 98.7 F (37.1 C)  TempSrc: Oral Oral Oral Oral  SpO2:  99% 100% 98%  Weight:      Height:        Intake/Output Summary (Last 24 hours) at 05/12/2018 0947 Last data filed at 05/12/2018 0900 Gross per 24 hour  Intake 821.55 ml  Output 2100 ml  Net -1278.45 ml   Last 3 Weights 05/11/2018 05/09/2018 05/09/2018  Weight (lbs) 192 lb 14.4 oz 198 lb 190 lb 7.6 oz  Weight (kg) 87.5 kg 89.812 kg 86.4 kg      Telemetry    SR - Personally Reviewed  ECG    SR with PVCs - Personally Reviewed  Physical Exam   GEN: No acute distress.   Neck: No JVD Cardiac: RRR, no murmurs, rubs, or gallops.  Respiratory: Clear to auscultation bilaterally. GI: Soft, nontender, non-distended  MS: No edema; No deformity. Neuro:  Nonfocal  Psych: Normal affect   Labs    Chemistry Recent Labs  Lab 05/09/18 0545  05/10/18 0323 05/11/18 0234  05/12/18 0212  NA 140   < > 139 138 138  K 3.3*   < > 3.7 4.2 4.6  CL 103   < > 105 105 105  CO2 27   < > 25 26 25   GLUCOSE 82   < > 87 72 82  BUN 10   < > 11 12 12   CREATININE 0.79   < > 0.72 0.74 0.67  CALCIUM 9.2   < > 8.9 8.8* 9.2  PROT 5.8*  --   --   --   --   ALBUMIN 3.1*  --   --   --   --   AST 82*  --   --   --   --   ALT 29  --   --   --   --   ALKPHOS 54  --   --   --   --   BILITOT 0.8  --   --   --   --   GFRNONAA >60   < > >60 >60 >60  GFRAA >60   < > >60 >60 >60  ANIONGAP  10   < > 9 7 8    < > = values in this interval not displayed.     Hematology Recent Labs  Lab 05/10/18 0323 05/11/18 0234 05/12/18 0212  WBC 17.1* 15.1* 15.0*  RBC 4.96 4.88 5.14  HGB 13.5 13.6 13.9  HCT 42.2 41.8 43.9  MCV 85.1 85.7 85.4  MCH 27.2 27.9 27.0  MCHC 32.0 32.5 31.7  RDW 15.1 14.9 15.0  PLT 213 194 210    Cardiac Enzymes Recent Labs  Lab 05/09/18 0545 05/09/18 1109  TROPONINI 13.51* 11.87*   No results for input(s): TROPIPOC in the last 168 hours.   BNP Recent Labs  Lab 05/09/18 0545  BNP 636.2*     DDimer No results for input(s): DDIMER in the last 168 hours.   Radiology    No results found.  Cardiac Studies   TTE: 05/09/2018   1. The left ventricle has mild-moderately reduced systolic function, with an ejection fraction of 40-45%. The cavity size was mildly dilated. There is mildly increased left ventricular wall thickness. There is severe hypokinesis of the basal  inferolateral myocardium. Grade 2 diastolic dysfunction.  2. The right ventricle has normal systolic function. The cavity was mildly enlarged. There is no increase in right ventricular wall thickness. Right ventricular systolic pressure is mildly elevated with an estimated pressure of 37.1 mmHg (decreased  compared to the prior study).  3. Left atrial size was moderately dilated.  4. The mitral valve is normal in structure.  5. The tricuspid valve is normal in structure.  6. The aortic  valve is tricuspid.  7. The aortic root is normal in size and structure.  Patient Profile     63 y.o. male with CAD and inferolateral STEMI in 02/2015, ICM with EF 40-45%, pulmonary fibrosis, RA, atrial flutter s/p ablation 05/2015, prior tobacco abuse, HTN, HL, RA who presented to [facility] NSTEMI in the setting of newly diagnosed afib with RVR. Seen by EP and placed on Sotalol.  Cardiac catheterization performed yesterday showed in-stent restenosis within the proximal circumflex and first obtuse marginal branch T stent.  He had only mild disease in his LAD and small RCA.  He was seen by Dr. Servando Snare for surgical consultation who felt he was not an ideal operative candidate because of his baseline pulmonary status and pulmonary fibrosis.  Assessment & Plan    1.  Paroxysmal atrial flutter: new onset while at OSH. Remains in SR. Received Sotalol dose at 11am yesterday, but held last night. EP ok with giving dose this morning. Stopped Coreg. Will need Vancleave post cath. ChadsVasc of at least 4. Appears Eliquis/Xarelto about the same in cost.  -- on IV heparin until cath today  2. NSTEMI: Occurred in the setting of AF with RVR but with elevation in troponins. Peaked at 13.51.  Cardiac catheterization performed yesterday by Dr. Tamala Julian revealed severe in-stent restenosis within the proximal circumflex and obtuse marginal branch T stent.  He was seen by Dr. Servando Snare in consultation for consideration of bypass grafting who felt he was not an optimal bypass candidate because of his underlying pulmonary fibrosis.  3. HTN:-- on ARB, BB stopped given his bradycardia.   4. Pulmonary fibrosis: plans to avoid amiodarone.   5. RA: several outpatient medications. Will continue the same.   We will review his options with Dr. Tamala Julian.  Suspect he will need a temporizing percutaneous procedure.  He remains on IV heparin.   For questions or updates, please contact [place]  Please consult  [email or web address] for contact info under    Lorretta Harp, M.D., [place], [facility], [place], [place] [address] [place], [place]  [address]  [phone] 05/12/2018 9:49 AM

## 2018-05-12 NOTE — Progress Notes (Signed)
Left forearm iv site swollen, iv line removed, ice pack applied x20 min. Swelling subsided. Continue to monitor.

## 2018-05-13 ENCOUNTER — Telehealth: Payer: Self-pay | Admitting: Cardiovascular Disease

## 2018-05-13 ENCOUNTER — Encounter (HOSPITAL_COMMUNITY): Payer: Self-pay | Admitting: Cardiology

## 2018-05-13 DIAGNOSIS — I447 Left bundle-branch block, unspecified: Secondary | ICD-10-CM

## 2018-05-13 DIAGNOSIS — I5023 Acute on chronic systolic (congestive) heart failure: Secondary | ICD-10-CM

## 2018-05-13 LAB — CBC
HCT: 48.9 % (ref 39.0–52.0)
Hemoglobin: 15.6 g/dL (ref 13.0–17.0)
MCH: 27.7 pg (ref 26.0–34.0)
MCHC: 31.9 g/dL (ref 30.0–36.0)
MCV: 86.7 fL (ref 80.0–100.0)
Platelets: 256 10*3/uL (ref 150–400)
RBC: 5.64 MIL/uL (ref 4.22–5.81)
RDW: 15.2 % (ref 11.5–15.5)
WBC: 17.1 10*3/uL — ABNORMAL HIGH (ref 4.0–10.5)
nRBC: 0 % (ref 0.0–0.2)

## 2018-05-13 LAB — BASIC METABOLIC PANEL
Anion gap: 8 (ref 5–15)
BUN: 13 mg/dL (ref 8–23)
CALCIUM: 9.8 mg/dL (ref 8.9–10.3)
CO2: 29 mmol/L (ref 22–32)
Chloride: 103 mmol/L (ref 98–111)
Creatinine, Ser: 1 mg/dL (ref 0.61–1.24)
GFR calc Af Amer: >60 mL/min (ref >60)
GFR calc non Af Amer: >60 mL/min (ref >60)
Glucose, Bld: 82 mg/dL (ref 70–99)
Potassium: 4.4 mmol/L (ref 3.5–5.1)
Sodium: 140 mmol/L (ref 135–145)

## 2018-05-13 LAB — HEPARIN LEVEL (UNFRACTIONATED): HEPARIN UNFRACTIONATED: 0.77 [IU]/mL — AB (ref 0.30–0.70)

## 2018-05-13 LAB — MAGNESIUM: MAGNESIUM: 2.2 mg/dL (ref 1.7–2.4)

## 2018-05-13 MED ORDER — APIXABAN 5 MG PO TABS: 5.0000 mg | ORAL_TABLET | Freq: Two times a day (BID) | ORAL | 3 refills | Status: DC

## 2018-05-13 MED ORDER — POTASSIUM CHLORIDE CRYS ER 20 MEQ PO TBCR: 40.0000 meq | EXTENDED_RELEASE_TABLET | Freq: Two times a day (BID) | ORAL | 1 refills | Status: DC

## 2018-05-13 MED ORDER — SOTALOL HCL 120 MG PO TABS: 120.0000 mg | ORAL_TABLET | Freq: Two times a day (BID) | ORAL | 1 refills | Status: DC

## 2018-05-13 MED ORDER — APIXABAN 5 MG PO TABS
5.0000 mg | ORAL_TABLET | Freq: Two times a day (BID) | ORAL | Status: DC
  Administered 2018-05-13: 5 mg via ORAL
  Filled 2018-05-13: qty 1

## 2018-05-13 MED ORDER — AMLODIPINE BESYLATE 5 MG PO TABS: 5.0000 mg | ORAL_TABLET | Freq: Every day | ORAL | 2 refills | Status: DC

## 2018-05-13 NOTE — Progress Notes (Addendum)
ANTICOAGULATION CONSULT NOTE   Pharmacy Consult for Heparin Indication: atrial fibrillation, s/p cath  No Known Allergies  Patient Measurements: Height: 5\' 7"  (170.2 cm) Weight: 192 lb 14.4 oz (87.5 kg) IBW/kg (Calculated) : 66.1 Heparin Dosing Weight: 84.8 kg  Vital Signs: Temp: 98.3 F (36.8 C) (03/11 0427) Temp Source: Oral (03/11 0427) BP: 134/75 (03/11 0427) Pulse Rate: 48 (03/10 2026)  Labs: Recent Labs    05/11/18 0234  05/12/18 0212 05/12/18 1133 05/13/18 0623  HGB 13.6  --  13.9  --  15.6  HCT 41.8  --  43.9  --  48.9  PLT 194  --  210  --  256  HEPARINUNFRC 0.80*   < > <0.10* <0.10* 0.77*  CREATININE 0.74  --  0.67  --   --    < > = values in this interval not displayed.    Estimated Creatinine Clearance: 97.3 mL/min (by C-G formula based on SCr of 0.67 mg/dL).   Medical History: Past Medical History:  Diagnosis Date  . Abnormal CT scan, stomach   . Chronic systolic dysfunction of left ventricle   . Coronary artery disease    lateral STEMI 02/22/15  . GERD (gastroesophageal reflux disease)   . Hyperlipidemia   . Hypertension   . Ischemic cardiomyopathy   . LBBB (left bundle branch block)   . Leukocytosis    followed by hematology, reactive  . Lymphadenopathy   . Myocardial infarction ([place]) 02/2015  . Psoriasis 2003  . Psoriatic arthritis ([place])   . Pulmonary fibrosis ([place])   . Rheumatoid arthritis(714.0) 2012  . Typical atrial flutter (HCC)      Assessment: 43 yoM started on IV heparin for new onset AFib. Pt has hx of AFlutter treated with ablation, no OAC prior to admission. Pt now s/p cath with noted limited PCI options. Pharmacy to resume heparin 8hr while awaiting workup for CABG.  Pt deemed not CABG candidate, now s/p staged PCI to restart IV heparin tonight 8hr after sheath pull. Previously subtherapeutic on 1250 units/hr this morning. Planning to start Eliquis today.  Heparin level supratherapeutic this AM at 0.77. CBCs stable, no  s/sx of bleeding documented  Goal of Therapy:  Heparin level 0.3-0.7 units/ml Monitor platelets by anticoagulation protocol: Yes   Plan:  -Decrease heparin drip to 1300 units/hr -Monitor CBCs, heparin level, s/sx of bleeding  -F/u initiation of Eliquis today  Addendum: Heparin gtt discontinued and apixaban to start at [place], [place] D PGY1 Pharmacy Resident  Phone [phone] 05/13/2018   7:06 AM

## 2018-05-13 NOTE — Discharge Summary (Addendum)
Discharge Summary    Patient ID: Curtis Flores,  MRN: [id number], DOB/AGE: 1955-06-07 63 y.o.  Admit date: 05/09/2018 Discharge date: 05/13/2018  Primary Care Provider: Dione Housekeeper Primary Cardiologist: Dr. [name]   Discharge Diagnoses    Principal Problem:   CAD S/P percutaneous coronary angioplasty Active Problems:   Pulmonary fibrosis (HCC)   Hypertension   Hyperlipidemia   Atrial flutter (HCC)   Acute on chronic systolic congestive heart failure (HCC)   LBBB (left bundle branch block)   PAF (paroxysmal atrial fibrillation) (HCC)   NICM (nonischemic cardiomyopathy) ([place])   Essential hypertension   NSTEMI (non-ST elevated myocardial infarction) ([place])   Allergies No Known Allergies  Diagnostic Studies/Procedures    TTE: 05/09/2018  IMPRESSIONS    1. The left ventricle has mild-moderately reduced systolic function, with an ejection fraction of 40-45%. The cavity size was mildly dilated. There is mildly increased left ventricular wall thickness. There is severe hypokinesis of the basal  inferolateral myocardium. Grade 2 diastolic dysfunction.  2. The right ventricle has normal systolic function. The cavity was mildly enlarged. There is no increase in right ventricular wall thickness. Right ventricular systolic pressure is mildly elevated with an estimated pressure of 37.1 mmHg (decreased  compared to the prior study).  3. Left atrial size was moderately dilated.  4. The mitral valve is normal in structure.  5. The tricuspid valve is normal in structure.  6. The aortic valve is tricuspid.  7. The aortic root is normal in size and structure.  Cath: 05/11/2018   Left dominant coronary anatomy.  Heavily calcified LAD and circumflex.  Widely patent left main.  LAD is heavily calcified and contains eccentric 50 to 70% proximal narrowing, 70 to 75% apical narrowing, and there is eccentric 90% obstruction in the first diagonal which is a large vessel.  The  previously stented proximal circumflex and first obtuse marginal, with T stent technique, demonstrates diffuse severe, 99%, ISR from the ostium of the circumflex into the proximal vessel beyond the bifurcation with the first obtuse marginal.  There is moderate in-stent restenosis in the first obtuse marginal of 50%.  The right coronary is nondominant.  The right coronary contains 60 to 70% mid vessel narrowing and after the acute marginal branch 60% narrowing.  Global left ventricular dysfunction with EF 35 to 40%.  RECOMMENDATIONS:   Discuss treatment strategy which could include PCI of the circumflex, excepting probable lower long-term patency and need for repeat intervention.  Alternatively CABG with [place] could be considered.  I have not discontinued the patient's Plavix.  Will discuss with team.  Discussed with Dr. [name].  Cath: 05/12/2018   Successful scoring balloon angioplasty of in-stent restenosis involving the ostium to proximal circumflex reducing a 99% stenosis to 40% with TIMI grade III flow.  The ostial stenosis in the first obtuse marginal could not be crossed with the balloon to perform angioplasty.  RECOMMENDATIONS:   Clopidogrel and apixaban going forward.  No aspirin should be used to avoid excess bleeding risk.  Further management per treating team.  Discussed with Dr. [name]. _____________   History of Present Illness     Curtis Flores is a 63 year old gentleman with CAD and inferolateral STEMI in 02/2015, ICM with EF 35-40%, pulmonary fibrosis, RA, atrial flutter s/p ablation 05/2015, prior tobacco abuse, HTN, HL, RA who presented to [facility] today due to substernal chest pain.  The patient reports that he had felt well since the time of his  most recent stent in 2016. In the past few months, however, has noticed gradual increase in shortness of breath and some episodes of mild chest discomfort. In the week leading up to his admission, reports feeling  significantly "run down". On the day of admission, patient reports sudden onset severe chest discomfort for which he presented to the ED at [place], the patient was found to be significantly hypertensive with BP 215/138, tachycardic to 138, O2 sat 96%. ECG showed atrial fibrillation with RVR and redemonstration of known LBBB. Labs significant for creatinine of 0.8, Troponin <0.01 with subsequent of 0.09, elevated WBC at 16.6. He was treated with 20mg  IV hydralazine, 324mg  PO aspirin, 5mg  IV metoprolol, was started on a nitroglycerin drip due to ongoing chest pain and was started on anticoagulation with IV heparin. He was transferred to [facility] for further care.  At the time of arrival, the patient reports that chest pain has resolved at this time but that it has been waxing and waning during the course of his admission thus far. He denies shortness of breath, orthopnea, PND, LEE or associated symptoms such as nausea, diaphoresis. Notes that BPs at home are generally in the 322G-254Y systolic.   Hospital Course     Consultants: EP  1.Paroxysmal atrial flutter: new onset while at OSH. Converted to SR PTA. Remained in SR. Started on Sotalol via Dr. Rayann Heman. Coreg started on admission but held 2/2 to bradycardia. ChadsVasc of at least 4. IV heparin stopped post cath and placed on Eliquis 5mg  BID. No ASA  2. NSTEMI: Occurred in the setting of AF with RVR but with elevation in troponins. Peaked at 13.51.  Cardiac catheterization performed by Dr. Tamala Julian revealed severe in-stent restenosis within the proximal circumflex and obtuse marginal branch T stent.  He was seen by Dr. Servando Snare in consultation for consideration of bypass grafting who felt he was not an optimal bypass candidate because of his underlying pulmonary fibrosis.  He underwent Cutting Balloon atherectomy via the right radial approach by Dr. Tamala Julian of his proximal circumflex in-stent restenosis with an excellent result.   Unfortunately, unable to cross the stent and dilate the origin of the stented obtuse marginal branch.  He recommended Plavix alone given that he would have to be on Eliquis as well for his A. fib  3. HTN:-- on ARB, BB stopped given his bradycardia.   4. Pulmonary fibrosis: plans to avoid amiodarone.   5. RA: several outpatient medications. Will continue the same.  6: Ischemic cardiomyopathy- EF of 40 to 45% by 2D echo.  He is on losartan.  His carvedilol was discontinued because he is on sotalol for PAF and is currently bradycardic.  7. HL:  On high dose statin  [name] was seen by Dr. [name] and determined stable for discharge home. Follow up in the office has been arranged. Medications are listed below.   _____________  Discharge Vitals Blood pressure 117/61, pulse (!) 50, temperature 98.2 F (36.8 C), temperature source Oral, resp. rate 20, height 5\' 7"  (1.702 m), weight 87.5 kg, SpO2 94 %.  Filed Weights   05/09/18 0134 05/09/18 0200 05/11/18 0500  Weight: 86.4 kg 89.8 kg 87.5 kg    Labs & Radiologic Studies    CBC Recent Labs    05/12/18 0212 05/13/18 0623  WBC 15.0* 17.1*  HGB 13.9 15.6  HCT 43.9 48.9  MCV 85.4 86.7  PLT 210 706   Basic Metabolic Panel Recent Labs  05/12/18 0212 05/13/18 0623  NA 138 140  K 4.6 4.4  CL 105 103  CO2 25 29  GLUCOSE 82 82  BUN 12 13  CREATININE 0.67 1.00  CALCIUM 9.2 9.8  MG 2.1 2.2   Liver Function Tests No results for input(s): AST, ALT, ALKPHOS, BILITOT, PROT, ALBUMIN in the last 72 hours. No results for input(s): LIPASE, AMYLASE in the last 72 hours. Cardiac Enzymes No results for input(s): CKTOTAL, CKMB, CKMBINDEX, TROPONINI in the last 72 hours. BNP Invalid input(s): POCBNP D-Dimer No results for input(s): DDIMER in the last 72 hours. Hemoglobin A1C No results for input(s): HGBA1C in the last 72 hours. Fasting Lipid Panel No results for input(s): CHOL, HDL, LDLCALC, TRIG, CHOLHDL, LDLDIRECT in the  last 72 hours. Thyroid Function Tests No results for input(s): TSH, T4TOTAL, T3FREE, THYROIDAB in the last 72 hours.  Invalid input(s): FREET3 _____________  No results found. Disposition   Pt is being discharged home today in good condition.  Follow-up Plans & Appointments    Follow-up Information    [facility] Follow up.   Specialty:  Cardiology Why:  05/18/2018 @ 9:00AM Contact information: [address] [id number] [place]       Erlene Quan, PA-C Follow up on 05/28/2018.   Specialties:  Cardiology, Radiology Why:  at 11:30am for your follow up appt.  Contact information: [address] [place] [address] [phone]          Discharge Instructions    Amb Referral to Cardiac Rehabilitation   Complete by:  As directed    Diagnosis:   NSTEMI PTCA     Diet - low sodium heart healthy   Complete by:  As directed    Discharge instructions   Complete by:  As directed    Radial Site Care Refer to this sheet in the next few weeks. These instructions provide you with information on caring for yourself after your procedure. Your caregiver may also give you more specific instructions. Your treatment has been planned according to current medical practices, but problems sometimes occur. Call your caregiver if you have any problems or questions after your procedure. HOME CARE INSTRUCTIONS You may shower the day after the procedure.Remove the bandage (dressing) and gently wash the site with plain soap and water.Gently pat the site dry.  Do not apply powder or lotion to the site.  Do not submerge the affected site in water for 3 to 5 days.  Inspect the site at least twice daily.  Do not flex or bend the affected arm for 24 hours.  No lifting over 5 pounds (2.3 kg) for 5 days after your procedure.  Do not drive home if you are discharged the same day of the procedure. Have someone  else drive you.  You may drive 24 hours after the procedure unless otherwise instructed by your caregiver.  What to expect: Any bruising will usually fade within 1 to 2 weeks.  Blood that collects in the tissue (hematoma) may be painful to the touch. It should usually decrease in size and tenderness within 1 to 2 weeks.  SEEK IMMEDIATE MEDICAL CARE IF: You have unusual pain at the radial site.  You have redness, warmth, swelling, or pain at the radial site.  You have drainage (other than a small amount of blood on the dressing).  You have chills.  You have a fever or persistent symptoms for more than 72 hours.  You have a fever and your symptoms suddenly get worse.  Your arm becomes pale, cool, tingly, or numb.  You have heavy bleeding from the site. Hold pressure on the site.   Increase activity slowly   Complete by:  As directed       Discharge Medications     Medication List    STOP taking these medications   azithromycin 250 MG tablet Commonly known as:  ZITHROMAX   carvedilol 12.5 MG tablet Commonly known as:  COREG     TAKE these medications   Abatacept 125 MG/ML Soaj Commonly known as:  Orencia ClickJect Inject 121 mg into the skin once a week. What changed:  additional instructions   amLODipine 5 MG tablet Commonly known as:  NORVASC Take 1 tablet (5 mg total) by mouth daily. Start taking on:  May 14, 2018   apixaban 5 MG Tabs tablet Commonly known as:  ELIQUIS Take 1 tablet (5 mg total) by mouth 2 (two) times daily.   atorvastatin 80 MG tablet Commonly known as:  LIPITOR Take 1 tablet (80 mg total) by mouth daily at 6 PM.   azaTHIOprine 50 MG tablet Commonly known as:  IMURAN Take 1 tablet (50 mg total) by mouth 2 (two) times daily.   Clobetasol Prop Emollient Base 0.05 % emollient cream Commonly known as:  Clobetasol Propionate E Apply to affected area BID What changed:    how much to take  how to take this  when to take this  additional  instructions   clopidogrel 75 MG tablet Commonly known as:  PLAVIX Take 1 tablet (75 mg total) by mouth daily.   furosemide 40 MG tablet Commonly known as:  LASIX Take 1 tablet (40 mg total) by mouth 2 (two) times daily.   losartan 100 MG tablet Commonly known as:  COZAAR Take 1 tablet (100 mg total) by mouth daily.   pantoprazole 40 MG tablet Commonly known as:  PROTONIX Take 1 tablet (40 mg total) by mouth 2 (two) times daily before a meal.   potassium chloride SA 20 MEQ tablet Commonly known as:  K-DUR,KLOR-CON Take 2 tablets (40 mEq total) by mouth 2 (two) times daily.   sotalol 120 MG tablet Commonly known as:  BETAPACE Take 1 tablet (120 mg total) by mouth every 12 (twelve) hours.        Acute coronary syndrome (MI, NSTEMI, STEMI, etc) this admission?: Yes.     AHA/ACC Clinical Performance & Quality Measures: 1. Aspirin prescribed? - No, on Eliquis 2. ADP Receptor Inhibitor (Plavix/Clopidogrel, Brilinta/Ticagrelor or Effient/Prasugrel) prescribed (includes medically managed patients)? - Yes 3. Beta Blocker prescribed? - No - bradycardia 4. High Intensity Statin (Lipitor 40-80mg  or Crestor 20-40mg ) prescribed? - Yes 5. EF assessed during THIS hospitalization? - Yes 6. For EF <40%, was ACEI/ARB prescribed? - Not Applicable (EF >/= 97%) 7. For EF <40%, Aldosterone Antagonist (Spironolactone or Eplerenone) prescribed? - Not Applicable (EF >/= 58%) 8. Cardiac Rehab Phase II ordered (Included Medically managed Patients)? - Yes    Outstanding Labs/Studies   N/a  Duration of Discharge Encounter   Greater than 30 minutes including physician time.  Signed, Reino Bellis NP-C 05/13/2018, 12:25 PM   Agree with note by Reino Bellis NP-C  Status post Cutting Balloon atherectomy proximal circumflex in-stent restenosis with a good result.  Unfortunately the obtuse marginal branch could not be intervened on because of being jailed by the circumflex AV groove stent.   He is pain-free.  His right radial  puncture site is intact.  He will be started on Eliquis and Plavix.  We will arrange close outpatient follow-up.  Lorretta Harp, M.D., [place], [facility], [place] [address] [place], [place]  [address]  [phone] 05/13/2018 12:51 PM

## 2018-05-13 NOTE — Telephone Encounter (Signed)
Patient currently admitted.  Attempt 03/12

## 2018-05-13 NOTE — Progress Notes (Signed)
Discharged home by wheelchair accompanied by daughter. Discharge instructions  Given to pt. Belongings taken home

## 2018-05-13 NOTE — Progress Notes (Signed)
Progress Note  Curtis Flores Name: Curtis Flores Date of Encounter: 05/13/2018  Primary Cardiologist: Dr. Quay Burow  Subjective   No chest pain. Remains in SR. postop day 1 circumflex Cutting Balloon atherectomy and balloon angioplasty by Dr. Tamala Julian.  Curtis Flores unable to intervene on Curtis jailed marginal branch.  Curtis Flores is asymptomatic on IV heparin.  Inpatient Medications    Scheduled Meds:  [START ON 05/15/2018] Abatacept  125 mg Subcutaneous Q Fri   amLODipine  5 mg Oral Daily   aspirin EC  81 mg Oral Daily   atorvastatin  80 mg Oral q1800   azaTHIOprine  50 mg Oral BID   clopidogrel  75 mg Oral Daily   furosemide  40 mg Oral BID   losartan  100 mg Oral Daily   pantoprazole  40 mg Oral BID AC   potassium chloride  40 mEq Oral BID   predniSONE  10 mg Oral Daily   sodium chloride flush  3 mL Intravenous Q12H   sotalol  120 mg Oral Q12H   Continuous Infusions:  sodium chloride     heparin 1,300 Units/hr (05/13/18 0800)   PRN Meds: sodium chloride, acetaminophen, sodium chloride flush   Vital Signs    Vitals:   05/12/18 2026 05/13/18 0427 05/13/18 0730 05/13/18 0911  BP: (!) 136/58 134/75  (!) 141/62  Pulse: (!) 48  62   Resp: 20 (!) 21    Temp: 98.3 F (36.8 C) 98.3 F (36.8 C) 97.7 F (36.5 C)   TempSrc: Oral Oral    SpO2: 98% 97% 96%   Weight:      Height:        Intake/Output Summary (Last 24 hours) at 05/13/2018 0929 Last data filed at 05/13/2018 0900 Gross per 24 hour  Intake 542.11 ml  Output 1675 ml  Net -1132.89 ml   Last 3 Weights 05/11/2018 05/09/2018 05/09/2018  Weight (lbs) 192 lb 14.4 oz 198 lb 190 lb 7.6 oz  Weight (kg) 87.5 kg 89.812 kg 86.4 kg      Telemetry    SR - Personally Reviewed  ECG    Sinus bradycardia at 52 with left bundle branch block.- Personally Reviewed  Physical Exam   GEN: No acute distress.   Neck: No JVD Cardiac: RRR, no murmurs, rubs, or gallops.  Respiratory: Clear to auscultation  bilaterally. GI: Soft, nontender, non-distended  MS: No edema; No deformity. Neuro:  Nonfocal  Psych: Normal affect   Labs    Chemistry Recent Labs  Lab 05/09/18 0545  05/11/18 0234 05/12/18 0212 05/13/18 0623  NA 140   < > 138 138 140  K 3.3*   < > 4.2 4.6 4.4  CL 103   < > 105 105 103  CO2 27   < > 26 25 29   GLUCOSE 82   < > 72 82 82  BUN 10   < > 12 12 13   CREATININE 0.79   < > 0.74 0.67 1.00  CALCIUM 9.2   < > 8.8* 9.2 9.8  PROT 5.8*  --   --   --   --   ALBUMIN 3.1*  --   --   --   --   AST 82*  --   --   --   --   ALT 29  --   --   --   --   ALKPHOS 54  --   --   --   --   BILITOT  0.8  --   --   --   --   GFRNONAA >60   < > >60 >60 >60  GFRAA >60   < > >60 >60 >60  ANIONGAP 10   < > 7 8 8    < > = values in this interval not displayed.     Hematology Recent Labs  Lab 05/11/18 0234 05/12/18 0212 05/13/18 0623  WBC 15.1* 15.0* 17.1*  RBC 4.88 5.14 5.64  HGB 13.6 13.9 15.6  HCT 41.8 43.9 48.9  MCV 85.7 85.4 86.7  MCH 27.9 27.0 27.7  MCHC 32.5 31.7 31.9  RDW 14.9 15.0 15.2  PLT 194 210 256    Cardiac Enzymes Recent Labs  Lab 05/09/18 0545 05/09/18 1109  TROPONINI 13.51* 11.87*   No results for input(s): TROPIPOC in Curtis last 168 hours.   BNP Recent Labs  Lab 05/09/18 0545  BNP 636.2*     DDimer No results for input(s): DDIMER in Curtis last 168 hours.   Radiology    No results found.  Cardiac Studies   TTE: 05/09/2018   1. Curtis left ventricle has mild-moderately reduced systolic function, with an ejection fraction of 40-45%. Curtis cavity size Flores mildly dilated. There is mildly increased left ventricular wall thickness. There is severe hypokinesis of Curtis basal  inferolateral myocardium. Grade 2 diastolic dysfunction.  2. Curtis right ventricle has normal systolic function. Curtis cavity Flores mildly enlarged. There is no increase in right ventricular wall thickness. Right ventricular systolic pressure is mildly elevated with an estimated pressure of 37.1  mmHg (decreased  compared to Curtis prior study).  3. Left atrial size Flores moderately dilated.  4. Curtis mitral valve is normal in structure.  5. Curtis tricuspid valve is normal in structure.  6. Curtis aortic valve is tricuspid.  7. Curtis aortic root is normal in size and structure.  Coronary intervention (05/12/2018)  Conclusion    Successful scoring balloon angioplasty of in-stent restenosis involving Curtis ostium to proximal circumflex reducing a 99% stenosis to 40% with TIMI grade III flow.  Curtis ostial stenosis in Curtis first obtuse marginal could not be crossed with Curtis balloon to perform angioplasty.  RECOMMENDATIONS:   Clopidogrel and apixaban going forward.  No aspirin should be used to avoid excess bleeding risk.  Further management per treating team.  Discussed with Dr. [name].     Curtis Flores Profile     63 y.o. male with CAD and inferolateral STEMI in 02/2015, ICM with EF 40-45%, pulmonary fibrosis, RA, atrial flutter s/p ablation 05/2015, prior tobacco abuse, HTN, HL, RA who presented to [place] NSTEMI in Curtis setting of newly diagnosed afib with RVR. Seen by EP and placed on Sotalol.  Cardiac catheterization performed yesterday showed in-stent restenosis within Curtis proximal circumflex and first obtuse marginal branch T stent.  Curtis Flores had only mild disease in his LAD and small RCA.  Curtis Flores seen by Dr. Servando Snare for surgical consultation who felt Curtis Flores not an ideal operative candidate because of his baseline pulmonary status and pulmonary fibrosis.  Curtis Flores underwent Cutting Balloon atherectomy of his proximal circumflex "in-stent restenosis" yesterday by Dr. Tamala Julian.  Assessment & Plan    1.  Paroxysmal atrial flutter: new onset while at OSH. Remains in SR. Received Sotalol dose at 11am yesterday, but held last night. EP ok with giving dose this morning. Stopped Coreg. Will need [place] post cath. ChadsVasc of at least 4. Appears Eliquis/Xarelto about Curtis same in cost.  -- on IV heparin until  cath today.  Curtis Flores has maintained sinus rhythm.  We will stop his IV heparin and begin him on Eliquis and discontinue his aspirin.  2. NSTEMI: Occurred in Curtis setting of AF with RVR but with elevation in troponins. Peaked at 13.51.  Cardiac catheterization performed yesterday by Dr. Tamala Julian revealed severe in-stent restenosis within Curtis proximal circumflex and obtuse marginal branch T stent.  Curtis Flores seen by Dr. Servando Snare in consultation for consideration of bypass grafting who felt Curtis Flores not an optimal bypass candidate because of his underlying pulmonary fibrosis.  Curtis Flores underwent Cutting Balloon atherectomy via Curtis right radial approach by Dr. Tamala Julian yesterday of his proximal circumflex in-stent restenosis with an excellent result.  Unfortunately, Dr. Tamala Julian Flores unable to cross Curtis stent and dilate Curtis origin of Curtis stented obtuse marginal branch.  Curtis Flores recommended Plavix alone given that Curtis Flores would have to be on Eliquis as well for his A. fib  3. HTN:-- on ARB, BB stopped given his bradycardia.   4. Pulmonary fibrosis: plans to avoid amiodarone.   5. RA: several outpatient medications. Will continue Curtis same.  6: Ischemic cardiomyopathy- EF of 40 to 45% by 2D echo.  Curtis Flores is on losartan.  His carvedilol Flores discontinued because Curtis Flores is on sotalol for PAF and is currently bradycardic.  Mr. Bozman is done well postop day 1 percutaneous intervention of his proximal circumflex.  Has been ambulating without limitation and his exam is benign.  I am going to discontinue his heparin and begin him on apixaban and stop aspirin as well.  Curtis Flores can be discharged home today on Plavix and Eliquis with close hospital follow-up      For questions or updates, please contact [place] Please consult [email or web address] for contact info under    Lorretta Harp, M.D., [place], [facility], [place], [place] [address] [place], [place]  [address]  [phone] 05/13/2018 9:29 AM

## 2018-05-13 NOTE — Care Management Important Message (Signed)
Important Message  Patient Details  Name: Curtis Flores MRN: [id number] Date of Birth: 05-08-1955   Medicare Important Message Given:  Yes    Barb Merino Aisha Greenberger 05/13/2018, 4:50 PM

## 2018-05-13 NOTE — Progress Notes (Signed)
All set to go home, waiting for a ride.

## 2018-05-13 NOTE — Telephone Encounter (Signed)
New Message   Pt has TOC Appt 05/28/18 at 11:30am with Kerin Ransom

## 2018-05-13 NOTE — Care Management (Signed)
CM Aldona Bar Hildreth Robart 05/13/18 Pt confirmed he is aware of copay for eliquis.  CM provided free 30 day card as pt informed CM that he has not yet utilized card.  Pt confirms he is independent from home, has PCP and denied barriers to paying for medications as prescribed.   CM Adrian Saran 05/09/18 Discussed prescription costs with pharmacist at patient's preferred pharmacy, [facility] [place], per MD request.  Tikosyn (generic) 248mcg or 500 mcg BID:  60 tablets is $86.42 copay.  Tikosyn GoodRX price at [facility]: $42.95 for 262mcg BID (#60)  $32.96 for 554mcg BID (#60)  Sotalol: $8/month  Eliquis 5 mg BID: $45/month (#60)  Xarelto: 20 mg daily $45/month (#30)  Informed patient of costs and choices.  Pt weighing risks/benefits of choice between Tikosyn and Sotalol.  Pt will discuss with family this afternoon and RN will inform Dr. Rayann Heman of patient's decision.  If patient chooses Tikosyn, script will need to be called into the [facility] at [facility] to receive the best [employer].

## 2018-05-13 NOTE — Progress Notes (Signed)
CARDIAC REHAB PHASE I   PRE:  Rate/Rhythm: 46-52 SB  BP:  Supine:   Sitting: 148/60  Standing:    SaO2: 98%RA  MODE:  Ambulation: 340 ft   POST:  Rate/Rhythm: 68 SR  BP:  Supine:   Sitting: 163/58  Standing:    SaO2: 98%RA 0925-1020 Pt walked 340 ft on RA with steady gait and tolerated well. No CP. Stated his breathing was better. MI education completed with pt who voiced understanding. Reviewed MI restrictions, NTG use(if ordered, told pt may not be ordered with low HR), calling 911 with CP, heart healthy food choices, ex ed, and CRP 2. Referred to [place] program. Pt stated he has not attended before due to high copays. Will depend on his insurance whether he can attend or not. In good spirits and remembers that I worked with him in the past.   Graylon Good, RN BSN  05/13/2018 10:17 AM

## 2018-05-13 NOTE — Discharge Instructions (Signed)

## 2018-05-14 MED FILL — Heparin Sod (Porcine) in NaCl IV Soln 25000 Unit/250ML-0.45%: INTRAVENOUS | Qty: 250 | Status: AC

## 2018-05-14 MED FILL — Nitroglycerin IV Soln 200 MCG/ML in D5W: INTRAVENOUS | Qty: 250 | Status: AC

## 2018-05-15 ENCOUNTER — Ambulatory Visit: Payer: Medicare HMO | Admitting: Physician Assistant

## 2018-05-15 NOTE — Telephone Encounter (Signed)
Patient contacted regarding discharge from [facility] 05/13/18.  Patient understands to follow up with provider [facility] on 05/28/18 at 11:30 AM at [facility]. Patient understands discharge instructions? yes  Patient understands medications and regiment? yes  Patient understands to bring all medications to this visit? yes

## 2018-05-18 ENCOUNTER — Other Ambulatory Visit: Payer: Self-pay

## 2018-05-18 ENCOUNTER — Encounter (HOSPITAL_COMMUNITY): Payer: Self-pay | Admitting: Nurse Practitioner

## 2018-05-18 ENCOUNTER — Ambulatory Visit (HOSPITAL_COMMUNITY)
Admission: RE | Admit: 2018-05-18 | Discharge: 2018-05-18 | Disposition: A | Discharge status: Home / Self Care | Payer: Medicare HMO | Source: Ambulatory Visit | Attending: Nurse Practitioner | Admitting: Nurse Practitioner

## 2018-05-18 VITALS — BP 132/58 | HR 43 | Ht 67.0 in | Wt 196.0 lb

## 2018-05-18 DIAGNOSIS — I10 Essential (primary) hypertension: Secondary | ICD-10-CM | POA: Diagnosis not present

## 2018-05-18 DIAGNOSIS — J8410 Pulmonary fibrosis, unspecified: Secondary | ICD-10-CM | POA: Insufficient documentation

## 2018-05-18 DIAGNOSIS — D72829 Elevated white blood cell count, unspecified: Secondary | ICD-10-CM | POA: Diagnosis not present

## 2018-05-18 DIAGNOSIS — I483 Typical atrial flutter: Secondary | ICD-10-CM | POA: Diagnosis not present

## 2018-05-18 DIAGNOSIS — I2510 Atherosclerotic heart disease of native coronary artery without angina pectoris: Secondary | ICD-10-CM | POA: Insufficient documentation

## 2018-05-18 DIAGNOSIS — I4891 Unspecified atrial fibrillation: Secondary | ICD-10-CM | POA: Insufficient documentation

## 2018-05-18 DIAGNOSIS — I252 Old myocardial infarction: Secondary | ICD-10-CM | POA: Insufficient documentation

## 2018-05-18 DIAGNOSIS — E785 Hyperlipidemia, unspecified: Secondary | ICD-10-CM | POA: Insufficient documentation

## 2018-05-18 DIAGNOSIS — M069 Rheumatoid arthritis, unspecified: Secondary | ICD-10-CM | POA: Insufficient documentation

## 2018-05-18 DIAGNOSIS — I480 Paroxysmal atrial fibrillation: Secondary | ICD-10-CM

## 2018-05-18 DIAGNOSIS — K219 Gastro-esophageal reflux disease without esophagitis: Secondary | ICD-10-CM | POA: Diagnosis not present

## 2018-05-18 DIAGNOSIS — Z79899 Other long term (current) drug therapy: Secondary | ICD-10-CM | POA: Diagnosis not present

## 2018-05-18 DIAGNOSIS — Z7901 Long term (current) use of anticoagulants: Secondary | ICD-10-CM | POA: Diagnosis not present

## 2018-05-18 DIAGNOSIS — I255 Ischemic cardiomyopathy: Secondary | ICD-10-CM | POA: Diagnosis not present

## 2018-05-18 DIAGNOSIS — I447 Left bundle-branch block, unspecified: Secondary | ICD-10-CM | POA: Diagnosis not present

## 2018-05-18 DIAGNOSIS — Z87891 Personal history of nicotine dependence: Secondary | ICD-10-CM | POA: Diagnosis not present

## 2018-05-18 DIAGNOSIS — Z8249 Family history of ischemic heart disease and other diseases of the circulatory system: Secondary | ICD-10-CM | POA: Insufficient documentation

## 2018-05-18 LAB — BASIC METABOLIC PANEL
ANION GAP: 5 (ref 5–15)
BUN: 12 mg/dL (ref 8–23)
CO2: 30 mmol/L (ref 22–32)
Calcium: 9.6 mg/dL (ref 8.9–10.3)
Chloride: 101 mmol/L (ref 98–111)
Creatinine, Ser: 0.79 mg/dL (ref 0.61–1.24)
GFR calc Af Amer: >60 mL/min (ref >60)
Glucose, Bld: 101 mg/dL — ABNORMAL HIGH (ref 70–99)
Potassium: 4 mmol/L (ref 3.5–5.1)
Sodium: 136 mmol/L (ref 135–145)

## 2018-05-18 LAB — MAGNESIUM: Magnesium: 2 mg/dL (ref 1.7–2.4)

## 2018-05-18 NOTE — Progress Notes (Addendum)
Primary Care Physician: Dione Housekeeper, MD Referring Physician: [facility] f/u EP: Dr. Rayann Heman Cardiologist:Dr. Zettie Pho is a 63 y.o. male with a h/o 63 y.o. male w/PMHx of HTN, HLD, CAD, pulmonary fibrosis, AFlutter (ablated 2017), LBBB, ICM, RA, sought attention for progressive severe chest pain for which he presented to [facility] ED and was found to have afib with RVR. He had hypertensive urgency and NSTEMI. He was transferred to [facility] for further evaluation.  He underwent LHC and was thought not to be a surgical candidate for Bypass 2/2 pulmonary fibrosis. He underwent successful scoring balloon angioplasty of in-stent restenosis involving the ostium to proximal circumflex reducing a 99% stenosis to 40% with TIMI grade III flow. The ostial stenosis in the first obtuse marginal could not be crossed with the balloon to perform angioplasty.  He was seen in consult by Dr. Rayann Heman for afib with RVR and was started on sotalol. He had brady with this and BB was stopped. He was placed on apixaban and clopidogrel, ASA was not used to avoid bleeding risks.  In f/u in afib clinic, he feels well, has not had any further chest pain or awareness of of irregular heart beat. He is in S brady at 43 bpm, although he is not symptomatic with this.  Today, he denies symptoms of palpitations, chest pain, shortness of breath, orthopnea, PND, lower extremity edema, dizziness, presyncope, syncope, or neurologic sequela. The patient is tolerating medications without difficulties and is otherwise without complaint today.   Past Medical History:  Diagnosis Date  . Abnormal CT scan, stomach   . Chronic systolic dysfunction of left ventricle   . Coronary artery disease    lateral STEMI 02/22/15 3/10 cutting balloon to ISR of the o/pLCX  . GERD (gastroesophageal reflux disease)   . Hyperlipidemia   . Hypertension   . Ischemic cardiomyopathy   . LBBB (left bundle branch block)   . Leukocytosis     followed by hematology, reactive  . Lymphadenopathy   . Myocardial infarction ([place]) 02/2015  . Psoriasis 2003  . Psoriatic arthritis ([place])   . Pulmonary fibrosis ([place])   . Rheumatoid arthritis(714.0) 2012  . Typical atrial flutter [facility])    Past Surgical History:  Procedure Laterality Date  . CARDIAC CATHETERIZATION N/A 02/22/2015   Procedure: Left Heart Cath and Coronary Angiography;  Surgeon: Lorretta Harp, MD;  Location: [place] CV LAB;  Service: Cardiovascular;  Laterality: N/A;  . CARDIAC CATHETERIZATION N/A 02/22/2015   Procedure: Coronary Stent Intervention;  Surgeon: Lorretta Harp, MD;  Location: [place] CV LAB;  Service: Cardiovascular;  Laterality: N/A;  . CARDIOVERSION N/A 04/12/2015   Procedure: CARDIOVERSION;  Surgeon: Larey Dresser, MD;  Location: [facility] ENDOSCOPY;  Service: Cardiovascular;  Laterality: N/A;  . COLONOSCOPY  07/01/2003   VOH:YWVPXT colonic mucosa except for the proximal right colon in the area of ICV which was not seen completely due to inadequate bowel prep. followed with ACBE which was normal.   . COLONOSCOPY N/A 08/24/2015   Dr. Gala Romney: Normal colon. Next colonoscopy in 10 years.  . CORONARY BALLOON ANGIOPLASTY N/A 05/12/2018   Procedure: CORONARY BALLOON ANGIOPLASTY;  Surgeon: Belva Crome, MD;  Location: [place] CV LAB;  Service: Cardiovascular;  Laterality: N/A;  . ELECTROPHYSIOLOGIC STUDY N/A 05/30/2015   Atrial fibrillation ablation by Dr Rayann Heman  . ESOPHAGOGASTRODUODENOSCOPY N/A 08/24/2015   Dr. Gala Romney: Medium-sized hiatal hernia, erosive gastropathy. Cameron lesions. Esophageal mucosa distally suggestive of short segment Barrett's  esophagus. Not confirmed on biopsy. Gastric biopsy with minimal chronic inflammation  . GIVENS CAPSULE STUDY N/A 04/17/2016   Procedure: GIVENS CAPSULE STUDY;  Surgeon: Daneil Dolin, MD;  [facility];  Service: Endoscopy;  Laterality: N/A;  Pt to arrive at 8:00 am for 8:30 am appt  . LEFT HEART CATH  AND CORONARY ANGIOGRAPHY N/A 05/11/2018   Procedure: LEFT HEART CATH AND CORONARY ANGIOGRAPHY;  Surgeon: Belva Crome, MD;  Location: [place] CV LAB;  Service: Cardiovascular;  Laterality: N/A;  . TEE WITHOUT CARDIOVERSION N/A 04/12/2015   Procedure: TRANSESOPHAGEAL ECHOCARDIOGRAM (TEE);  Surgeon: Larey Dresser, MD;  Location: [facility] ENDOSCOPY;  Service: Cardiovascular;  Laterality: N/A;    Current Outpatient Medications  Medication Sig Dispense Refill  . Abatacept (ORENCIA CLICKJECT) 149 MG/ML SOAJ Inject 125 mg into the skin once a week. (Patient taking differently: Inject 125 mg into the skin once a week. Every Friday) 12 Syringe 0  . amLODipine (NORVASC) 5 MG tablet Take 1 tablet (5 mg total) by mouth daily. 30 tablet 2  . apixaban (ELIQUIS) 5 MG TABS tablet Take 1 tablet (5 mg total) by mouth 2 (two) times daily. 60 tablet 3  . atorvastatin (LIPITOR) 80 MG tablet Take 1 tablet (80 mg total) by mouth daily at 6 PM. 90 tablet 1  . azaTHIOprine (IMURAN) 50 MG tablet Take 1 tablet (50 mg total) by mouth 2 (two) times daily. 180 tablet 0  . Clobetasol Prop Emollient Base (CLOBETASOL PROPIONATE E) 0.05 % emollient cream Apply to affected area BID (Patient taking differently: Apply 1 application topically daily. ) 30 g 2  . clopidogrel (PLAVIX) 75 MG tablet Take 1 tablet (75 mg total) by mouth daily. 90 tablet 1  . furosemide (LASIX) 40 MG tablet Take 1 tablet (40 mg total) by mouth 2 (two) times daily. 180 tablet 1  . losartan (COZAAR) 100 MG tablet Take 1 tablet (100 mg total) by mouth daily. 90 tablet 1  . pantoprazole (PROTONIX) 40 MG tablet Take 1 tablet (40 mg total) by mouth 2 (two) times daily before a meal. 180 tablet 2  . potassium chloride SA (K-DUR,KLOR-CON) 20 MEQ tablet Take 2 tablets (40 mEq total) by mouth 2 (two) times daily. 120 tablet 1  . sotalol (BETAPACE) 120 MG tablet Take 1 tablet (120 mg total) by mouth every 12 (twelve) hours. 60 tablet 1   No current  facility-administered medications for this encounter.     No Known Allergies  Social History   Socioeconomic History  . Marital status: Married    Spouse name: Not on file  . Number of children: 2  . Years of education: Not on file  . Highest education level: Not on file  Occupational History  . Occupation: unemployed    Comment: not working do to arthritis; used to be Patent attorney for a [employer]  . Financial resource strain: Not on file  . Food insecurity:    Worry: Not on file    Inability: Not on file  . Transportation needs:    Medical: Not on file    Non-medical: Not on file  Tobacco Use  . Smoking status: Former Smoker    Packs/day: 1.00    Years: 30.00    Pack years: 30.00    Types: Cigarettes    Last attempt to quit: 03/21/2003    Years since quitting: 15.1  . Smokeless tobacco: Never Used  Substance and Sexual Activity  .  Alcohol use: No    Alcohol/week: 0.0 standard drinks    Comment: H/O case of beer weekly x 20 years, quiting in 2000-ish.  . Drug use: No    Comment: H/O marijuana use many years ago.  [name] Sexual activity: Yes    Birth control/protection: None  Lifestyle  . Physical activity:    Days per week: Not on file    Minutes per session: Not on file  . Stress: Not on file  Relationships  . Social connections:    Talks on phone: Not on file    Gets together: Not on file    Attends religious service: Not on file    Active member of club or organization: Not on file    Attends meetings of clubs or organizations: Not on file    Relationship status: Not on file  . Intimate partner violence:    Fear of current or ex partner: Not on file    Emotionally abused: Not on file    Physically abused: Not on file    Forced sexual activity: Not on file  Other Topics Concern  . Not on file  Social History Narrative  . Not on file    Family History  Problem Relation Age of Onset  . Hypertension Mother   . Colon cancer Neg Hx      ROS- All systems are reviewed and negative except as per the HPI above  Physical Exam: Vitals:   05/18/18 0842  BP: (!) 132/58  Pulse: (!) 43  Weight: 88.9 kg  Height: 5\' 7"  (1.702 m)   Wt Readings from Last 3 Encounters:  05/18/18 88.9 kg  05/11/18 87.5 kg  03/20/18 89.8 kg    Labs: Lab Results  Component Value Date   NA 140 05/13/2018   K 4.4 05/13/2018   CL 103 05/13/2018   CO2 29 05/13/2018   GLUCOSE 82 05/13/2018   BUN 13 05/13/2018   CREATININE 1.00 05/13/2018   CALCIUM 9.8 05/13/2018   PHOS 3.2 04/07/2015   MG 2.2 05/13/2018   Lab Results  Component Value Date   INR 1.2 05/09/2018   Lab Results  Component Value Date   CHOL 130 05/09/2018   HDL 51 05/09/2018   LDLCALC 71 05/09/2018   TRIG 40 05/09/2018     GEN- The patient is well appearing, alert and oriented x 3 today.   Head- normocephalic, atraumatic Eyes-  Sclera clear, conjunctiva pink Ears- hearing intact Oropharynx- clear Neck- supple, no JVP Lymph- no cervical lymphadenopathy Lungs- Clear to ausculation bilaterally, normal work of breathing Heart- slow regular rate and rhythm, no murmurs, rubs or gallops, PMI not laterally displaced GI- soft, NT, ND, + BS Extremities- no clubbing, cyanosis, or edema MS- no significant deformity or atrophy Skin- no rash or lesion Psych- euthymic mood, full affect Neuro- strength and sensation are intact  EKG-Marked sinus brady at 43 bpm, PR int LAD, LBBB, qtc at 407 ms Epic reecords reviewed Echo-IMPRESSIONS    1. The left ventricle has mild-moderately reduced systolic function, with an ejection fraction of 40-45%. The cavity size was mildly dilated. There is mildly increased left ventricular wall thickness. There is severe hypokinesis of the basal  inferolateral myocardium. Grade 2 diastolic dysfunction.  2. The right ventricle has normal systolic function. The cavity was mildly enlarged. There is no increase in right ventricular wall thickness.  Right ventricular systolic pressure is mildly elevated with an estimated pressure of 37.1 mmHg (decreased  compared to the prior study).  3. Left atrial size was moderately dilated.  4. The mitral valve is normal in structure.  5. The tricuspid valve is normal in structure.  6. The aortic valve is tricuspid.  7. The aortic root is normal in size and structure.  FINDINGS  Left Ventricle: The left ventricle has mild-moderately reduced systolic function, with an ejection fraction of 40-45%. The cavity size was mildly dilated. There is mildly increased left ventricular wall thickness. Left ventricular diastolic Doppler  parameters are consistent with pseudonormalization Right Ventricle: The right ventricle has normal systolic function. The cavity was mildly enlarged. There is no increase in right ventricular wall thickness. Right ventricular systolic pressure is mildly elevated with an estimated pressure of 37.1 mmHg. Left Atrium: left atrial size was moderately dilated Right Atrium: right atrial size was mild-moderately dilated. Right atrial pressure is estimated at 3 mmHg. Interatrial Septum: No atrial level shunt detected by color flow Doppler. Pericardium: There is no evidence of pericardial effusion. Mitral Valve: The mitral valve is normal in structure. Mitral valve regurgitation is trivial by color flow Doppler. Tricuspid Valve: The tricuspid valve is normal in structure. Tricuspid valve regurgitation is trivial by color flow Doppler. Aortic Valve: The aortic valve is tricuspid Aortic valve regurgitation was not visualized by color flow Doppler. Pulmonic Valve: The pulmonic valve was grossly normal. Pulmonic valve regurgitation is not visualized by color flow Doppler. Aorta: The aortic root is normal in size and structure. Venous: The inferior vena cava is normal in size with greater than 50% respiratory variability.     Assessment and Plan: 1. Afib with RVR General education re  afib  and triggers Started on sotalol 120 mg bid In SR with asymptomatic brady  No BB on board for brady On eliquis 5 mg bid for CHA2DS2VASc score of at least 4 Bleeding precautions discussed Bemt/mag drawn today with K+/mag at range(Please note, result note read for tikosyn admit, it should have read for recent sotalol load)  2. CAD No anginal symptoms Per Dr. [name] No ASA for plavix/eliquis on board No BB for brady  3. HTN Stable   F/u with Kerin Ransom, PA-C 3/26 afib clinic 3 months    Geroge Baseman. Savva Beamer, [facility] [address] [place], [place] [address] [phone]

## 2018-05-18 NOTE — Addendum Note (Signed)
Encounter addended by: Sherran Needs, NP on: 05/18/2018 1:16 PM  Actions taken: Clinical Note Signed

## 2018-05-21 NOTE — Addendum Note (Signed)
Encounter addended by: Sherran Needs, NP on: 05/21/2018 11:48 AM  Actions taken: LOS modified

## 2018-05-25 ENCOUNTER — Other Ambulatory Visit: Payer: Self-pay

## 2018-05-25 MED ORDER — POTASSIUM CHLORIDE CRYS ER 20 MEQ PO TBCR: 40.0000 meq | EXTENDED_RELEASE_TABLET | Freq: Two times a day (BID) | ORAL | 0 refills | Status: DC

## 2018-05-25 MED ORDER — APIXABAN 5 MG PO TABS: 5.0000 mg | ORAL_TABLET | Freq: Two times a day (BID) | ORAL | 3 refills | Status: DC

## 2018-05-25 MED ORDER — SOTALOL HCL 120 MG PO TABS: 120.0000 mg | ORAL_TABLET | Freq: Two times a day (BID) | ORAL | 0 refills | Status: DC

## 2018-05-25 MED ORDER — AMLODIPINE BESYLATE 5 MG PO TABS: 5.0000 mg | ORAL_TABLET | Freq: Every day | ORAL | 0 refills | Status: DC

## 2018-05-27 ENCOUNTER — Telehealth: Payer: Self-pay | Admitting: Cardiology

## 2018-05-27 NOTE — Telephone Encounter (Signed)
   Primary Cardiologist: Dr [name]  Pt contacted.  History and symptoms reviewed.  Pt will f[facility]u with [facility] provider as scheduled.  Pt. advised that we are restricting visitors at this time and request that only patients present for check-in prior to their appointment.  All other visitors should remain in their car.  If necessary, only one visitor may come with the patient, into the building. For everyone's safety, all patients and visitor entering our practice area should expect to be screened again prior to entering our waiting area.  Kerin Ransom, [place]  05/27/2018 11:06 AM

## 2018-05-28 ENCOUNTER — Encounter: Payer: Self-pay | Admitting: Cardiology

## 2018-05-28 ENCOUNTER — Ambulatory Visit (INDEPENDENT_AMBULATORY_CARE_PROVIDER_SITE_OTHER): Payer: Medicare HMO | Admitting: Cardiology

## 2018-05-28 ENCOUNTER — Other Ambulatory Visit: Payer: Self-pay

## 2018-05-28 VITALS — BP 138/60 | HR 45 | Ht 67.0 in | Wt 196.0 lb

## 2018-05-28 DIAGNOSIS — I214 Non-ST elevation (NSTEMI) myocardial infarction: Secondary | ICD-10-CM

## 2018-05-28 DIAGNOSIS — I10 Essential (primary) hypertension: Secondary | ICD-10-CM

## 2018-05-28 DIAGNOSIS — I447 Left bundle-branch block, unspecified: Secondary | ICD-10-CM

## 2018-05-28 DIAGNOSIS — Z7901 Long term (current) use of anticoagulants: Secondary | ICD-10-CM

## 2018-05-28 DIAGNOSIS — I483 Typical atrial flutter: Secondary | ICD-10-CM | POA: Diagnosis not present

## 2018-05-28 DIAGNOSIS — J8410 Pulmonary fibrosis, unspecified: Secondary | ICD-10-CM

## 2018-05-28 DIAGNOSIS — I2510 Atherosclerotic heart disease of native coronary artery without angina pectoris: Secondary | ICD-10-CM | POA: Diagnosis not present

## 2018-05-28 DIAGNOSIS — I480 Paroxysmal atrial fibrillation: Secondary | ICD-10-CM

## 2018-05-28 DIAGNOSIS — I4891 Unspecified atrial fibrillation: Secondary | ICD-10-CM | POA: Insufficient documentation

## 2018-05-28 DIAGNOSIS — M05732 Rheumatoid arthritis with rheumatoid factor of left wrist without organ or systems involvement: Secondary | ICD-10-CM

## 2018-05-28 DIAGNOSIS — Z9861 Coronary angioplasty status: Secondary | ICD-10-CM

## 2018-05-28 DIAGNOSIS — M05731 Rheumatoid arthritis with rheumatoid factor of right wrist without organ or systems involvement: Secondary | ICD-10-CM

## 2018-05-28 DIAGNOSIS — I255 Ischemic cardiomyopathy: Secondary | ICD-10-CM

## 2018-05-28 NOTE — Progress Notes (Signed)
05/28/2018 Curtis Flores   [birth year]  [id number]  Primary Physician Dione Housekeeper, MD Primary Cardiologist: Dr [name] Dr Rayann Heman  HPI:   The patient is a pleasant 63 year old male who is been followed by cardiology in the past.  He has a history of a prior ST EMI December 2016.  This was treated with circumflex PCI with DES as well as an OM1 DES.  In 2017 he had atrial fibrillation.  He was not put on anticoagulation at that time because of iron deficiency anemia.  Ultimately his atrial fibrillation was treated with ablation.  Other medical issues include rheumatoid arthritis and interstitial lung disease.  Patient presented to [facility] rocking him May 09, 2018 with chest pain and shortness of breath.  He was noted to be in atrial fibrillation with rapid ventricular response.  His troponin was elevated consistent with the NSTEMI and he was transferred to [facility] for further evaluation.  His troponin peaked at 13.  Catheterization done on 05/11/2018 showed a 70% mid RCA, 99% proximal CFX in-stent restenosis, 85% OM1 in-stent restenosis, 70% proximal LAD.  After review of his cath films it was decided to proceed with PCI.  This was done 05/12/2018.  He underwent p.o. BA to the circumflex at the proximal and mid vessel with a residual stenosis of 30 to 40%.  He tolerated the procedure well.  He was seen by EP during that hospitalization and placed on sotalol.  His beta-blocker was stopped because of bradycardia.  It was decided to put him on Eliquis and Plavix, no aspirin.  He followed up with the A. fib clinic on March 16, he was doing well in sinus rhythm, sinus bradycardia.  He is in the office today for cardiology follow-up.  He denies any chest pain shortness of breath or tachycardia.  He says occasionally he gets dizzy but he is not had syncope or near syncope.  He has had no obvious bleeding.   Current Outpatient Medications  Medication Sig Dispense Refill  . Abatacept (ORENCIA CLICKJECT) 235  MG/ML SOAJ Inject 125 mg into the skin once a week. (Patient taking differently: Inject 125 mg into the skin once a week. Every Friday) 12 Syringe 0  . amLODipine (NORVASC) 5 MG tablet Take 1 tablet (5 mg total) by mouth daily. 90 tablet 0  . apixaban (ELIQUIS) 5 MG TABS tablet Take 1 tablet (5 mg total) by mouth 2 (two) times daily. 60 tablet 3  . atorvastatin (LIPITOR) 80 MG tablet Take 1 tablet (80 mg total) by mouth daily at 6 PM. 90 tablet 1  . azaTHIOprine (IMURAN) 50 MG tablet Take 1 tablet (50 mg total) by mouth 2 (two) times daily. 180 tablet 0  . Clobetasol Prop Emollient Base (CLOBETASOL PROPIONATE E) 0.05 % emollient cream Apply to affected area BID (Patient taking differently: Apply 1 application topically daily. ) 30 g 2  . clopidogrel (PLAVIX) 75 MG tablet Take 1 tablet (75 mg total) by mouth daily. 90 tablet 1  . furosemide (LASIX) 40 MG tablet Take 1 tablet (40 mg total) by mouth 2 (two) times daily. 180 tablet 1  . losartan (COZAAR) 100 MG tablet Take 1 tablet (100 mg total) by mouth daily. 90 tablet 1  . pantoprazole (PROTONIX) 40 MG tablet Take 1 tablet (40 mg total) by mouth 2 (two) times daily before a meal. 180 tablet 2  . potassium chloride SA (K-DUR,KLOR-CON) 20 MEQ tablet Take 2 tablets (40 mEq total) by mouth 2 (two)  times daily. 360 tablet 0  . sotalol (BETAPACE) 120 MG tablet Take 1 tablet (120 mg total) by mouth every 12 (twelve) hours. 180 tablet 0   No current facility-administered medications for this visit.     No Known Allergies  Past Medical History:  Diagnosis Date  . Abnormal CT scan, stomach   . Chronic systolic dysfunction of left ventricle   . Coronary artery disease    lateral STEMI 02/22/15 3/10 cutting balloon to ISR of the o/pLCX  . GERD (gastroesophageal reflux disease)   . Hyperlipidemia   . Hypertension   . Ischemic cardiomyopathy   . LBBB (left bundle branch block)   . Leukocytosis    followed by hematology, reactive  . Lymphadenopathy    . Myocardial infarction ([place]) 02/2015  . Psoriasis 2003  . Psoriatic arthritis ([place])   . Pulmonary fibrosis ([place])   . Rheumatoid arthritis(714.0) 2012  . Typical atrial flutter ([facility])     Social History   Socioeconomic History  . Marital status: Married    Spouse name: Not on file  . Number of children: 2  . Years of education: Not on file  . Highest education level: Not on file  Occupational History  . Occupation: unemployed    Comment: not working do to arthritis; used to be Patent attorney for a [employer]  . Financial resource strain: Not on file  . Food insecurity:    Worry: Not on file    Inability: Not on file  . Transportation needs:    Medical: Not on file    Non-medical: Not on file  Tobacco Use  . Smoking status: Former Smoker    Packs/day: 1.00    Years: 30.00    Pack years: 30.00    Types: Cigarettes    Last attempt to quit: 03/21/2003    Years since quitting: 15.1  . Smokeless tobacco: Never Used  Substance and Sexual Activity  . Alcohol use: No    Alcohol/week: 0.0 standard drinks    Comment: H/O case of beer weekly x 20 years, quiting in 2000-ish.  . Drug use: No    Comment: H/O marijuana use many years ago.  [name] Sexual activity: Yes    Birth control/protection: None  Lifestyle  . Physical activity:    Days per week: Not on file    Minutes per session: Not on file  . Stress: Not on file  Relationships  . Social connections:    Talks on phone: Not on file    Gets together: Not on file    Attends religious service: Not on file    Active member of club or organization: Not on file    Attends meetings of clubs or organizations: Not on file    Relationship status: Not on file  . Intimate partner violence:    Fear of current or ex partner: Not on file    Emotionally abused: Not on file    Physically abused: Not on file    Forced sexual activity: Not on file  Other Topics Concern  . Not on file  Social History Narrative  .  Not on file     Family History  Problem Relation Age of Onset  . Hypertension Mother   . Colon cancer Neg Hx      Review of Systems: General: negative for chills, fever, night sweats or weight changes.  Cardiovascular: negative for chest pain, dyspnea on exertion, edema, orthopnea, palpitations, paroxysmal nocturnal dyspnea or  shortness of breath Dermatological: negative for rash Respiratory: negative for cough or wheezing Urologic: negative for hematuria Abdominal: negative for nausea, vomiting, diarrhea, bright red blood per rectum, melena, or hematemesis Neurologic: negative for visual changes, syncope, or dizziness All other systems reviewed and are otherwise negative except as noted above.    Pulse (!) 45, height 5\' 7"  (1.702 m), weight 196 lb (88.9 kg).  General appearance: alert, cooperative and no distress Heart: regular rate and rhythm Extremities: no edema Skin: Skin color, texture, turgor normal. No rashes or lesions Neurologic: Grossly normal  EKG NSR, SB-45- LBBB- QTc 415  ASSESSMENT AND PLAN:   NSTEMI (non-ST elevated myocardial infarction) ([place]) Pt admitted 05/09/2018 to [facility] with chest pain and SOB. Found to be in AF with RVR.  Troponin peak-13  CAD S/P percutaneous coronary angioplasty 02/22/15 Acute lateral wall infarction emergent cath with PCI/DES to circumflex AV groove and first marginal branch by Dr. [name]  05/12/2018 CFX POBA for ISR (2sites), residual 85% OM1 ISR, 70% mRCA, 70% pLAD- medical Rx  Atrial fibrillation with RVR ([place]) On admission 05/09/2018- NSR/SB on Sotalol post NSTEMI  Essential hypertension Echo March 2020- EF 40-45% with grade 2 DD  Dyslipidemia, goal LDL below 70 LDL 71 on high dose statin Rx March 2020  LBBB (left bundle branch block) Chronic LBBB, sinus bradycardia  PAF (paroxysmal atrial fibrillation) ([place]) TEE CV Feb 2017- recurrent AF- RFA 05/30/15, also taken off Amiodarone (ILD) and anticoagulation (Fe  anemia).  Recurrent AF with RVR with NSTEMI March 2020- Sotalol added- Eliquis added.  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  I did not adjust his medications.  F/U with Dr [name] in two months.   Kerin Ransom PA-C 05/28/2018 12:03 PM

## 2018-05-28 NOTE — Assessment & Plan Note (Signed)
Chronic LBBB, sinus bradycardia

## 2018-05-28 NOTE — Assessment & Plan Note (Signed)
Pt admitted 05/09/2018 to [facility] with chest pain and SOB. Found to be in AF with RVR.  Troponin peak-13

## 2018-05-28 NOTE — Assessment & Plan Note (Signed)
LDL 71 on high dose statin Rx March 2020

## 2018-05-28 NOTE — Assessment & Plan Note (Signed)
Echo March 2020- EF 40-45% with grade 2 DD

## 2018-05-28 NOTE — Assessment & Plan Note (Signed)
On admission 05/09/2018- NSR/SB on Sotalol post NSTEMI

## 2018-05-28 NOTE — Patient Instructions (Addendum)
Medication Instructions:  Your physician recommends that you continue on your current medications as directed. Please refer to the Current Medication list given to you today. If you need a refill on your cardiac medications before your next appointment, please call your pharmacy.   Lab work: NONE  If you have labs (blood work) drawn today and your tests are completely normal, you will receive your results only by: [name] MyChart Message (if you have MyChart) OR . A paper copy in the mail If you have any lab test that is abnormal or we need to change your treatment, we will call you to review the results.  Testing/Procedures: NONE   Follow-Up: At [facility], you and your health needs are our priority.  As part of our continuing mission to provide you with exceptional heart care, we have created designated Provider Care Teams.  These Care Teams include your primary Cardiologist (physician) and Advanced Practice Providers (APPs -  Physician Assistants and Nurse Practitioners) who all work together to provide you with the care you need, when you need it. You will need a follow up appointment in 2 months. You may see Dr Quay Burow or one of the following Advanced Practice Providers on your designated Care Team:   Kerin Ransom, PA-C Roby Lofts, [place] . Sande Rives, PA-C  Any Other Special Instructions Will Be Listed Below (If Applicable).

## 2018-05-28 NOTE — Assessment & Plan Note (Signed)
Started March 2020 for recurrent PAF- Eliquis and Plavix (no ASA post PCI) CHADS VASC=4

## 2018-05-28 NOTE — Assessment & Plan Note (Signed)
+  RF; +CCP; -Dr Estanislado Pandy follows

## 2018-05-28 NOTE — Assessment & Plan Note (Signed)
EF 40-45% by echo March 2020

## 2018-05-28 NOTE — Assessment & Plan Note (Signed)
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

## 2018-05-28 NOTE — Assessment & Plan Note (Signed)
TEE CV Feb 2017- recurrent AF- RFA 05/30/15, also taken off Amiodarone (ILD) and anticoagulation (Fe anemia).  Recurrent AF with RVR with NSTEMI March 2020- Sotalol added- Eliquis added.

## 2018-05-28 NOTE — Assessment & Plan Note (Signed)
02/22/15 Acute lateral wall infarction emergent cath with PCI/DES to circumflex AV groove and first marginal branch by Dr. [name]  05/12/2018 CFX POBA for ISR (2sites), residual 85% OM1 ISR, 70% mRCA, 70% pLAD- medical Rx

## 2018-06-03 ENCOUNTER — Other Ambulatory Visit: Payer: Self-pay

## 2018-06-03 ENCOUNTER — Telehealth: Payer: Self-pay

## 2018-06-03 MED ORDER — APIXABAN 5 MG PO TABS: 5.0000 mg | ORAL_TABLET | Freq: Two times a day (BID) | ORAL | 3 refills | Status: DC

## 2018-06-03 MED ORDER — AMLODIPINE BESYLATE 5 MG PO TABS: 5.0000 mg | ORAL_TABLET | Freq: Every day | ORAL | 3 refills | Status: DC

## 2018-06-04 ENCOUNTER — Telehealth: Payer: Self-pay

## 2018-06-05 MED ORDER — APIXABAN 5 MG PO TABS: 5.0000 mg | ORAL_TABLET | Freq: Two times a day (BID) | ORAL | 1 refills | Status: DC

## 2018-06-05 NOTE — Telephone Encounter (Signed)
Thank you.  Please include pharmacy and any other info - ie; patient would like 90 day refill of his Eliquis sent to [facility].  Helps [place] out.  I'll take care of this one.

## 2018-06-05 NOTE — Telephone Encounter (Signed)
Refill sent to mail order. 

## 2018-06-20 ENCOUNTER — Other Ambulatory Visit: Payer: Self-pay | Admitting: Rheumatology

## 2018-06-22 NOTE — Telephone Encounter (Signed)
Last Visit: 12/02/2017 Next Visit: message sent to the front desk to schedule.   Okay to refill per Dr. Estanislado Pandy.

## 2018-06-23 ENCOUNTER — Telehealth: Payer: Self-pay | Admitting: Rheumatology

## 2018-06-23 NOTE — Telephone Encounter (Signed)
I LMOM on patients cell phone, to call, and schedule a return office with Dr. Estanislado Pandy.

## 2018-06-23 NOTE — Telephone Encounter (Signed)
-----   Message from [place] sent at 06/22/2018 11:02 AM EDT ----- Please call to schedule follow up. Patient is due now. Thanks!

## 2018-07-03 ENCOUNTER — Other Ambulatory Visit: Payer: Self-pay | Admitting: *Deleted

## 2018-07-03 NOTE — Telephone Encounter (Signed)
Refill request received via fax   Last Visit: 12/02/2017 Next Visit: message sent to the front desk to schedule.  Labs: 05/18/18 BMP WNL, CBC shows WBC 17.1  TB Gold: 01/21/18 Neg   Okay to refill per Dr. Estanislado Pandy

## 2018-07-24 ENCOUNTER — Telehealth: Payer: Self-pay | Admitting: Rheumatology

## 2018-07-24 MED ORDER — PREDNISONE 10 MG PO TABS: 10.0000 mg | ORAL_TABLET | Freq: Every day | ORAL | 0 refills | Status: DC

## 2018-07-24 NOTE — Telephone Encounter (Signed)
Patient's wife advised that we have not tapered his prednisone at this time. Patient has been scheduled for a follow up visit for 07/28/18 as he is due. She request prescription to be sent to local pharmacy.   Last visit: 12/02/17 Next Visit: 07/28/18  Okay to refill per Dr. Estanislado Pandy

## 2018-07-24 NOTE — Telephone Encounter (Signed)
Patient's wife calling to let you know patient is down to one Prednisone tab. Patient wants to know if he needs to keep taking it, or if he is through with it. Patient uses Patent attorney. Please call patient to advise.

## 2018-07-25 ENCOUNTER — Other Ambulatory Visit: Payer: Self-pay | Admitting: Cardiovascular Disease

## 2018-07-28 ENCOUNTER — Other Ambulatory Visit: Payer: Self-pay

## 2018-07-28 ENCOUNTER — Telehealth (HOSPITAL_COMMUNITY): Payer: Self-pay | Admitting: *Deleted

## 2018-07-28 ENCOUNTER — Encounter: Payer: Self-pay | Admitting: Physician Assistant

## 2018-07-28 ENCOUNTER — Ambulatory Visit (INDEPENDENT_AMBULATORY_CARE_PROVIDER_SITE_OTHER): Payer: Medicare HMO | Admitting: Rheumatology

## 2018-07-28 ENCOUNTER — Telehealth: Payer: Self-pay | Admitting: Cardiovascular Disease

## 2018-07-28 VITALS — BP 156/65 | HR 45 | Resp 15 | Ht 67.0 in | Wt 200.0 lb

## 2018-07-28 DIAGNOSIS — J8410 Pulmonary fibrosis, unspecified: Secondary | ICD-10-CM

## 2018-07-28 DIAGNOSIS — M0579 Rheumatoid arthritis with rheumatoid factor of multiple sites without organ or systems involvement: Secondary | ICD-10-CM

## 2018-07-28 DIAGNOSIS — Z8639 Personal history of other endocrine, nutritional and metabolic disease: Secondary | ICD-10-CM

## 2018-07-28 DIAGNOSIS — Z79899 Other long term (current) drug therapy: Secondary | ICD-10-CM

## 2018-07-28 DIAGNOSIS — Z8709 Personal history of other diseases of the respiratory system: Secondary | ICD-10-CM

## 2018-07-28 DIAGNOSIS — Z8679 Personal history of other diseases of the circulatory system: Secondary | ICD-10-CM

## 2018-07-28 DIAGNOSIS — L409 Psoriasis, unspecified: Secondary | ICD-10-CM | POA: Diagnosis not present

## 2018-07-28 DIAGNOSIS — Z8719 Personal history of other diseases of the digestive system: Secondary | ICD-10-CM

## 2018-07-28 DIAGNOSIS — L4050 Arthropathic psoriasis, unspecified: Secondary | ICD-10-CM | POA: Diagnosis not present

## 2018-07-28 DIAGNOSIS — M8589 Other specified disorders of bone density and structure, multiple sites: Secondary | ICD-10-CM

## 2018-07-28 MED ORDER — PREDNISONE 5 MG PO TABS: ORAL_TABLET | ORAL | 1 refills | Status: DC

## 2018-07-28 NOTE — Telephone Encounter (Signed)
-----   Message from Juluis Mire, RN sent at 07/28/2018  1:01 PM EDT ----- Regarding: appt Called for recall letter - can prob make toward august - in office sotalol labs/ekg

## 2018-07-28 NOTE — Progress Notes (Signed)
Office Visit Note  Patient: Curtis Flores             Date of Birth: April 23, 1955           MRN: [id number]             PCP: Dione Housekeeper, MD Referring: Dione Housekeeper, MD Visit Date: 07/28/2018 Occupation: @GUAROCC @  Subjective:  Medication monitoring    History of Present Illness: Curtis Flores is a 63 y.o. male with history of psoriatic arthritis, rheumatoid arthritis, and pulmonary fibrosis.  He is on prednisone 10 mg po daily, Orencia 125 mg sq once weekly, and Imuran 50 mg BID.  He denies any joint pain or joint swelling.  He was started on a prednisone taper in January 2020 by Dr. Elsworth Soho and he has been taking prednisone 10 mg by mouth daily since then.  He has no stiffness at this time.  He has no concerns.  Activities of Daily Living:  Patient reports morning stiffness for 0 minutes.   Patient Denies nocturnal pain.  Difficulty dressing/grooming: Denies Difficulty climbing stairs: Denies Difficulty getting out of chair: Denies Difficulty using hands for taps, buttons, cutlery, and/or writing: Denies  Review of Systems  Constitutional: Negative for fatigue and night sweats.  HENT: Positive for mouth dryness. Negative for mouth sores and nose dryness.   Eyes: Positive for dryness. Negative for redness and visual disturbance.  Respiratory: Positive for shortness of breath. Negative for cough, hemoptysis and difficulty breathing.   Cardiovascular: Negative for chest pain, palpitations, hypertension, irregular heartbeat and swelling in legs/feet.  Gastrointestinal: Negative for constipation and diarrhea.  Endocrine: Negative for increased urination.  Genitourinary: Negative for painful urination.  Musculoskeletal: Negative for arthralgias, joint pain, joint swelling, myalgias, muscle weakness, morning stiffness, muscle tenderness and myalgias.  Skin: Positive for rash. Negative for color change, hair loss, nodules/bumps, skin tightness, ulcers and sensitivity to sunlight.   Allergic/Immunologic: Negative for susceptible to infections.  Neurological: Negative for dizziness, fainting, memory loss, night sweats and weakness.  Hematological: Negative for swollen glands.  Psychiatric/Behavioral: Negative for depressed mood and sleep disturbance. The patient is not nervous/anxious.     PMFS History:  Patient Active Problem List   Diagnosis Date Noted  . Atrial fibrillation with RVR ([place]) 05/28/2018  . Chronic anticoagulation 05/28/2018  . NSTEMI (non-ST elevated myocardial infarction) ([place]) 05/09/2018  . Lobar pneumonia, unspecified organism ([place]) 01/31/2018  . Sepsis ([place]) 01/31/2018  . Acute respiratory failure with hypoxia ([place]) 01/31/2018  . Ischemic cardiomyopathy 12/04/2016  . Leukocytosis 07/17/2016  . Pain in joint involving multiple sites 04/25/2016  . Psoriatic arthritis ([place]) 02/14/2016  . High risk medication use 02/14/2016  . NICM (nonischemic cardiomyopathy) ([place]) 08/29/2015  . Essential hypertension 08/29/2015  . Abnormal CT scan, stomach   . Hiatal hernia   . GERD (gastroesophageal reflux disease) 08/01/2015  . Weight loss, unintentional 08/01/2015  . Iron deficiency anemia 06/06/2015  . Heme positive stool 06/06/2015  . PAF (paroxysmal atrial fibrillation) ([place]) 05/30/2015  . CAD S/P percutaneous coronary angioplasty 04/07/2015  . 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 Acute on chronic systolic congestive heart failure ([place]) 04/07/2015  . LBBB (left bundle branch block) 04/07/2015  . Wide-complex tachycardia ([place]) 04/07/2015  . Dyslipidemia, goal LDL below 70 03/07/2015  . Psoriasis   . Rheumatoid arthritis ([place])   . Pulmonary fibrosis ([place]) 03/21/2011    Past Medical History:  Diagnosis Date  . Abnormal CT scan, stomach   . Chronic systolic dysfunction  of left ventricle   . Coronary artery disease    lateral STEMI 02/22/15 3/10 cutting balloon to ISR of the o/pLCX  . GERD (gastroesophageal reflux disease)   .  Hyperlipidemia   . Hypertension   . Ischemic cardiomyopathy   . LBBB (left bundle branch block)   . Leukocytosis    followed by hematology, reactive  . Lymphadenopathy   . Myocardial infarction ([place]) 02/2015  . Psoriasis 2003  . Psoriatic arthritis ([place])   . Pulmonary fibrosis ([place])   . Rheumatoid arthritis(714.0) 2012  . Typical atrial flutter ([facility])     Family History  Problem Relation Age of Onset  . Hypertension Mother   . Colon cancer Neg Hx    Past Surgical History:  Procedure Laterality Date  . CARDIAC CATHETERIZATION N/A 02/22/2015   Procedure: Left Heart Cath and Coronary Angiography;  Surgeon: Lorretta Harp, MD;  Location: [place] [facility];  Service: Cardiovascular;  Laterality: N/A;  . CARDIAC CATHETERIZATION N/A 02/22/2015   Procedure: Coronary Stent Intervention;  Surgeon: Lorretta Harp, MD;  Location: [place] [facility];  Service: Cardiovascular;  Laterality: N/A;  . CARDIOVERSION N/A 04/12/2015   Procedure: CARDIOVERSION;  Surgeon: Larey Dresser, MD;  Location[facility] ENDOSCOPY;  Service: Cardiovascular;  Laterality: N/A;  . COLONOSCOPY  07/01/2003   ZOX:WRUEAV colonic mucosa except for the proximal right colon in the area of ICV which was not seen completely due to inadequate bowel prep. followed with ACBE which was normal.   . COLONOSCOPY N/A 08/24/2015   Dr. Gala Romney: Normal colon. Next colonoscopy in 10 years.  . CORONARY BALLOON ANGIOPLASTY N/A 05/12/2018   Procedure: CORONARY BALLOON ANGIOPLASTY;  Surgeon: Belva Crome, MD;  Location: [place] [facility];  Service: Cardiovascular;  Laterality: N/A;  . ELECTROPHYSIOLOGIC STUDY N/A 05/30/2015   Atrial fibrillation ablation by Dr Rayann Heman  . ESOPHAGOGASTRODUODENOSCOPY N/A 08/24/2015   Dr. Gala Romney: Medium-sized hiatal hernia, erosive gastropathy. Cameron lesions. Esophageal mucosa distally suggestive of short segment Barrett's esophagus. Not confirmed on biopsy. Gastric biopsy with minimal chronic inflammation  . GIVENS  CAPSULE STUDY N/A 04/17/2016   Procedure: GIVENS CAPSULE STUDY;  Surgeon: Daneil Dolin, MD;  [facility];  Service: Endoscopy;  Laterality: N/A;  Pt to arrive at 8:00 am for 8:30 am appt  . LEFT HEART CATH AND CORONARY ANGIOGRAPHY N/A 05/11/2018   Procedure: LEFT HEART CATH AND CORONARY ANGIOGRAPHY;  Surgeon: Belva Crome, MD;  Location: [place] [facility];  Service: Cardiovascular;  Laterality: N/A;  . TEE WITHOUT CARDIOVERSION N/A 04/12/2015   Procedure: TRANSESOPHAGEAL ECHOCARDIOGRAM (TEE);  Surgeon: Larey Dresser, MD;  Location: [facility]OSCOPY;  Service: Cardiovascular;  Laterality: N/A;   Social History   Social History Narrative  . Not on file   Immunization History  Administered Date(s) Administered  . Influenza Split 12/22/2010  . Influenza Whole 01/18/2010  . Influenza, High Dose Seasonal PF 11/11/2017  . Influenza, Seasonal, Injecte, Preservative Fre 12/03/2014, 12/13/2015, 12/03/2017  . Influenza-Unspecified 12/03/2014, 12/13/2015  . Pneumococcal Conjugate-13 08/20/2016, 04/07/2018  . Pneumococcal Polysaccharide-23 01/22/2011, 03/05/2011  . Pneumococcal-Unspecified 01/22/2011     Objective: Vital Signs: BP (!) 156/65 (BP Location: Left Arm, Patient Position: Sitting, Cuff Size: Normal)   Pulse (!) 45   Resp 15   Ht 5\' 7"  (1.702 m)   Wt 200 lb (90.7 kg)   BMI 31.32 kg/m    Physical Exam Vitals signs and nursing note reviewed.  Constitutional:      Appearance:  He is well-developed.  HENT:     Head: Normocephalic and atraumatic.  Eyes:     Conjunctiva/sclera: Conjunctivae normal.     Pupils: Pupils are equal, round, and reactive to light.  Neck:     Musculoskeletal: Normal range of motion and neck supple.  Cardiovascular:     Rate and Rhythm: Normal rate and regular rhythm.     Heart sounds: Normal heart sounds.  Pulmonary:     Effort: Pulmonary effort is normal.     Breath sounds: Rales present.  Abdominal:     General: Bowel sounds are normal.      Palpations: Abdomen is soft.  Lymphadenopathy:     Cervical: No cervical adenopathy.  Skin:    General: Skin is warm and dry.     Capillary Refill: Capillary refill takes less than 2 seconds.  Neurological:     Mental Status: He is alert and oriented to person, place, and time.  Psychiatric:        Behavior: Behavior normal.      Musculoskeletal Exam: Synovial thickening of both wirst joints.  Right 1st, 3rd, 4th and 5th MCP joint synovial thickening.  Left 2nd, 3rd, and 5th MCP synovial thickening.   PIP and DIP synovial thickening noted.  Hip joints, knee joints,   Psoriasis on scalp.    CDAI Exam: CDAI Score: 0.2  Patient Global Assessment: 1 (mm); Provider Global Assessment: 1 (mm) Swollen: 0 ; Tender: 0  Joint Exam   Not documented   There is currently no information documented on the homunculus. Go to the Rheumatology activity and complete the homunculus joint exam.  Investigation: No additional findings.  Imaging: No results found.  Recent Labs: Lab Results  Component Value Date   WBC 17.1 (H) 05/13/2018   HGB 15.6 05/13/2018   PLT 256 05/13/2018   NA 136 05/18/2018   K 4.0 05/18/2018   CL 101 05/18/2018   CO2 30 05/18/2018   GLUCOSE 101 (H) 05/18/2018   BUN 12 05/18/2018   CREATININE 0.79 05/18/2018   BILITOT 0.8 05/09/2018   ALKPHOS 54 05/09/2018   AST 82 (H) 05/09/2018   ALT 29 05/09/2018   PROT 5.8 (L) 05/09/2018   ALBUMIN 3.1 (L) 05/09/2018   CALCIUM 9.6 05/18/2018   GFRAA >60 05/18/2018   QFTBGOLDPLUS NEGATIVE 01/21/2018    Speciality Comments: No specialty comments available.  Procedures:  No procedures performed Allergies: Patient has no known allergies.   Assessment / Plan:     Visit Diagnoses: Psoriatic arthritis ([place]): He has no synovitis or dactylitis on exam.  He has not had any SI joint tenderness, Achilles tendinitis, or plantar fasciitis.  He had psoriasis on his scalp currently and has been using topical agents.  He is  clinically doing well on Orencia 125 mg subcu injections and prednisone 10 mg by mouth daily.  He will start tapering his prednisone.  He was advised to notify us if he develops increased joint pain or joint swelling.  He will follow up in 4 months.   Psoriasis: He has psoriasis on his scalp.  He uses a topical agent which is effective.  Rheumatoid arthritis involving multiple sites with positive rheumatoid factor ([place]): He has no synovitis on exam.  He has synovial thickening of MCP joints as described above.  He is synovial thickening of bilateral wrist joints.  No tenderness was noted.  He is clinically doing well on Orencia 125 mg subcu weekly injections, Imuran 100 mg by mouth daily, and  prednisone 10 mg by mouth daily.  He is started on a prednisone taper in January 2020 by Dr. Elsworth Soho.  He has not had any joint pain or joint swelling since being on 10 mg of prednisone.  He has not had any recent flares.  We discussed starting to taper his dose of prednisone.  He is advised to notify us if he develops increased joint pain or joint swelling.  He will follow-up in the office in 4 months.  High risk medication use - Orencia sq(10/11/2016), imuran 100 mg po qd, prednisone 10 mg po qd ( Inadequate response to Enbrel, Humira, Cosyntex, Otezla ). Last TB gold negative 01/21/2018 and will monitor weekly. Most recent BMP within normal limits on 05/18/2018.  Most recent CBC within normal limits except for elevated WBC on 05/13/2018. Will monitor CBC/CMP every 3 months. Standing orders are in place. He received his flu vaccine in September and previously Prevnar 13 and Pneumovax 23.    Pulmonary fibrosis [facility]): He continues to follow-up with Dr. Elsworth Soho on a regular basis.  According to Dr. Bari Mantis note on 03/20/2018 he has on function shows a 15% drop likely due to a previous diagnosis of pneumonia.  He is advised to continue on Imuran 100 mg by mouth daily.  If he develops any new or worsening symptoms he will follow-up  with Dr. Elsworth Soho.  Other medical conditions are listed as follows:   History of congestive heart failure  History of bronchiectasis  Osteopenia of multiple sites  History of atrial fibrillation  History of coronary artery disease  History of hyperlipidemia  History of hypertension  History of gastroesophageal reflux (GERD)   Orders: No orders of the defined types were placed in this encounter.  No orders of the defined types were placed in this encounter.    Follow-Up Instructions: Return in about 4 months (around 11/28/2018) for Rheumatoid arthritis, Psoriatic arthritis.   Ofilia Neas, PA-C   I examined and evaluated the patient with Hazel Sams PA.  Patient is clinically doing well with combination of Orencia Imuran and prednisone 10 mg p.o. daily.  We will taper prednisone to 7.5 mg if tolerated and then he can taper further to 5 mg if tolerated.  We also discussed that if he has flare of his arthritis he can increase his prednisone again.  He had no synovitis on examination today.  Although he did have synovial thickening in multiple joints as described above.  The plan of care was discussed as noted above.  Bo Merino, MD Note - This record has been created using Editor, commissioning.  Chart creation errors have been sought, but may not always  have been located. Such creation errors do not reflect on  the standard of medical care.

## 2018-07-28 NOTE — Telephone Encounter (Signed)
lft msg to get pt scheduled for follow up

## 2018-07-28 NOTE — Patient Instructions (Signed)
Standing Labs We placed an order today for your standing lab work.    Please come back and get your standing labs in June and every 3 months   We have open lab daily Monday through Thursday from 8:30-12:30 PM and 1:30-4:30 PM and Friday from 8:30-12:30 PM and 1:30 -4:00 PM at the office of Dr. Bo Merino.   You may experience shorter wait times on Monday and Friday afternoons. The office is located at [address] [place], [place], [place] [address] No appointment is necessary.   Labs are drawn by [facility].  You may receive a bill from [facility] for your lab work.  If you wish to have your labs drawn at another location, please call the office 24 hours in advance to send orders.  If you have any questions regarding directions or hours of operation,  please call [phone].   Just as a reminder please drink plenty of water prior to coming for your lab work. Thanks!

## 2018-07-28 NOTE — Telephone Encounter (Signed)
Virtual visit/phone visit/call [id number] my chart/pre reg complete/consent obtained -- ttf

## 2018-07-29 ENCOUNTER — Telehealth (INDEPENDENT_AMBULATORY_CARE_PROVIDER_SITE_OTHER): Payer: Medicare HMO | Admitting: Cardiovascular Disease

## 2018-07-29 ENCOUNTER — Other Ambulatory Visit: Payer: Self-pay | Admitting: Rheumatology

## 2018-07-29 DIAGNOSIS — I4891 Unspecified atrial fibrillation: Secondary | ICD-10-CM

## 2018-07-29 DIAGNOSIS — I255 Ischemic cardiomyopathy: Secondary | ICD-10-CM

## 2018-07-29 DIAGNOSIS — D508 Other iron deficiency anemias: Secondary | ICD-10-CM

## 2018-07-29 DIAGNOSIS — I480 Paroxysmal atrial fibrillation: Secondary | ICD-10-CM

## 2018-07-29 DIAGNOSIS — I2510 Atherosclerotic heart disease of native coronary artery without angina pectoris: Secondary | ICD-10-CM

## 2018-07-29 DIAGNOSIS — Z9861 Coronary angioplasty status: Secondary | ICD-10-CM

## 2018-07-29 DIAGNOSIS — Z7901 Long term (current) use of anticoagulants: Secondary | ICD-10-CM | POA: Diagnosis not present

## 2018-07-29 DIAGNOSIS — I10 Essential (primary) hypertension: Secondary | ICD-10-CM

## 2018-07-29 DIAGNOSIS — I5023 Acute on chronic systolic (congestive) heart failure: Secondary | ICD-10-CM

## 2018-07-29 DIAGNOSIS — E785 Hyperlipidemia, unspecified: Secondary | ICD-10-CM

## 2018-07-29 MED ORDER — SOTALOL HCL 120 MG PO TABS: 120.0000 mg | ORAL_TABLET | Freq: Two times a day (BID) | ORAL | 3 refills | Status: DC

## 2018-07-29 MED ORDER — POTASSIUM CHLORIDE CRYS ER 20 MEQ PO TBCR: 40.0000 meq | EXTENDED_RELEASE_TABLET | Freq: Two times a day (BID) | ORAL | 0 refills | Status: DC

## 2018-07-29 MED ORDER — SOTALOL HCL 120 MG PO TABS: 120.0000 mg | ORAL_TABLET | Freq: Two times a day (BID) | ORAL | 0 refills | Status: DC

## 2018-07-29 MED ORDER — POTASSIUM CHLORIDE CRYS ER 20 MEQ PO TBCR: 40.0000 meq | EXTENDED_RELEASE_TABLET | Freq: Two times a day (BID) | ORAL | 3 refills | Status: DC

## 2018-07-29 NOTE — Patient Instructions (Addendum)
Medication Instructions:  Your physician has recommended you make the following change in your medication:  YOU ARE TO BE WEANED OFF OF YOUR CARVEDILOL . PLEASE FOLLOW THESE INSTRUCTIONS:  -STARTING TOMORROW (Thursday 07/30/2018), TAKE CARVEDILOL 6.25MG  (HALF OF YOUR 12.5MG  TABLET) TWICE A DAY FOR A WEEK  -STARTING Friday 08/07/2018, DECREASE YOUR CARVEDILOL TO 6.25MG  ONCE A DAY FOR A WEEK  -STOP TAKING YOUR CARVEDILOL ON Saturday 08/15/2018 AND DO NOT TAKE THIS MEDICATION AGAIN.    If you need a refill on your cardiac medications before your next appointment, please call your pharmacy.   Lab work: NONE If you have labs (blood work) drawn today and your tests are completely normal, you will receive your results only by: [name] MyChart Message (if you have MyChart) OR . A paper copy in the mail If you have any lab test that is abnormal or we need to change your treatment, we will call you to review the results.  Testing/Procedures: NONE  Follow-Up: At [facility], you and your health needs are our priority.  As part of our continuing mission to provide you with exceptional heart care, we have created designated Provider Care Teams.  These Care Teams include your primary Cardiologist (physician) and Advanced Practice Providers (APPs -  Physician Assistants and Nurse Practitioners) who all work together to provide you with the care you need, when you need it. . You will need a follow up appointment in 3 months with Kerin Ransom, PA-C and in 6 months with Dr. [name].  Please call our office 2 months in advance to schedule this appointment.    Any Other Special Instructions Will Be Listed Below (If Applicable). YOU WILL BE CONTACTED BY A CLINICAL PHARMACIST WITHIN THE NEXT FEW WEEKS TO DISCUSS YOUR VITAL SIGNS. PLEASE KEEP A DAILY RECORD OF YOUR BLOOD PRESSURE AND HEART RATE FOR THE PHARMACIST TO REVIEW.

## 2018-07-29 NOTE — Telephone Encounter (Signed)
Last Visit: 07/28/18 Next Visit: 12/01/18 Labs: 05/13/18 WBC 17.1   Okay to refill per Dr. Estanislado Pandy

## 2018-07-29 NOTE — Progress Notes (Signed)
Virtual Visit via Telephone Note   This visit type was conducted due to national recommendations for restrictions regarding the COVID-19 Pandemic (e.g. social distancing) in an effort to limit this patient's exposure and mitigate transmission in our community.  Due to his co-morbid illnesses, this patient is at least at moderate risk for complications without adequate follow up.  This format is felt to be most appropriate for this patient at this time.  The patient did not have access to video technology/had technical difficulties with video requiring transitioning to audio format only (telephone).  All issues noted in this document were discussed and addressed.  No physical exam could be performed with this format.  Please refer to the patient's chart for his  consent to telehealth for [facility].   Date:  07/29/2018   ID:  [name], DOB 03-23-1955, MRN [id number]  Patient Location: Home Provider Location: Home  PCP:  Dione Housekeeper, MD  Cardiologist:  Quay Burow, MD  Electrophysiologist:  None   Evaluation Performed:  Follow-Up Visit  Chief Complaint: Post hospital follow-up visit CAD and A. fib  History of Present Illness:    Curtis Flores is a 63 y.o.  thin-appearing married African-American male father of 2, grandfather to 3 grandchildren accompanied by his wife Curtis Flores today. I last saw him in the office 12/02/2017.His primary care physician is Dr. Edrick Oh in medicine. I recently saw him in the office 03/07/15. He has a remote history of tobacco abuse as well as a history of hypertension and hyperlipidemia rheumatoid arthritis. He had an inferolateral STEMI on 02/22/15. I took him to the Cath Lab and stented his proximal dominant circumflex and first obtuse marginal branch with drug-eluting stents (T stenting). He had residual 90% stenosis and a mid nondominant RCA 80% proximal LAD stenosis. His EF was 35-40% by 2-D echo.Subsequent Myoview stress testing performed soon  thereafter was nonischemic.[name] There is a history of pulmonary fibrosis however as well. When I saw him in the office on 04/05/15 he was significantly dyspneic and tachycardic. I initially thought that this was sinus tachycardia however he was admitted to the hospital 2 days later with progressive dyspnea and he was diagnosed with atrial flutter. He did have a wide complex tachycardia prior to that which converted with venous adenosine. He UNDERWENT transesophageal echo/DC cardioversion successfully was has resulted in marked improvement in his symptoms. His EF at that time was 20%. He was put on oral anticoagulations andBrilentawas changed to Plavix.He also underwent a flutter ablation by Dr. Rayann Heman on 05/30/15 and was taken off his amiodarone.   Since I saw him last October, he was admitted to [facility] with a non-STEMI 05/09/2018.  He underwent cardiac catheterization by Dr. Tamala Julian on 05/11/2018 revealing multivessel disease with a 99% proximal in-stent restenosis within the circumflex stent as well as obtuse branch in-stent restenosis and moderate disease in the LAD and RCA.  His EF was 35 to 45% by ventriculography and 40 to 45% by 2D echo.  After consideration of revascularization strategy it was decided to proceed with angioplasty of his proximal circumflex by Dr. Tamala Julian on 05/12/2018 with an acceptable result.  He was discharged home on Eliquis and Plavix.  Aspirin has been discontinued.  Because of his A. fib he was begun on sotalol and apparently his beta-blocker was discontinued.  Since being at home he feels clinically improved.  He denies chest pain or shortness of breath.  The patient does not have symptoms concerning for COVID-19  infection (fever, chills, cough, or new shortness of breath).    Past Medical History:  Diagnosis Date   Abnormal CT scan, stomach    Chronic systolic dysfunction of left ventricle    Coronary artery disease    lateral STEMI 02/22/15 3/10 cutting balloon to  ISR of the o/pLCX   GERD (gastroesophageal reflux disease)    Hyperlipidemia    Hypertension    Ischemic cardiomyopathy    LBBB (left bundle branch block)    Leukocytosis    followed by hematology, reactive   Lymphadenopathy    Myocardial infarction ([place]) 02/2015   Psoriasis 2003   Psoriatic arthritis ([place])    Pulmonary fibrosis ([place])    Rheumatoid arthritis(714.0) 2012   Typical atrial flutter ([place])    Past Surgical History:  Procedure Laterality Date   CARDIAC CATHETERIZATION N/A 02/22/2015   Procedure: Left Heart Cath and Coronary Angiography;  Surgeon: Lorretta Harp, MD;  Location: [place] CV LAB;  Service: Cardiovascular;  Laterality: N/A;   CARDIAC CATHETERIZATION N/A 02/22/2015   Procedure: Coronary Stent Intervention;  Surgeon: Lorretta Harp, MD;  Location: [place] CV LAB;  Service: Cardiovascular;  Laterality: N/A;   CARDIOVERSION N/A 04/12/2015   Procedure: CARDIOVERSION;  Surgeon: Larey Dresser, MD;  Location: [facility] ENDOSCOPY;  Service: Cardiovascular;  Laterality: N/A;   COLONOSCOPY  07/01/2003   NID:[id number] colonic mucosa except for the proximal right colon in the area of ICV which was not seen completely due to inadequate bowel prep. followed with ACBE which was normal.    COLONOSCOPY N/A 08/24/2015   Dr. Gala Romney: Normal colon. Next colonoscopy in 10 years.   CORONARY BALLOON ANGIOPLASTY N/A 05/12/2018   Procedure: CORONARY BALLOON ANGIOPLASTY;  Surgeon: Belva Crome, MD;  Location: [place] CV LAB;  Service: Cardiovascular;  Laterality: N/A;   ELECTROPHYSIOLOGIC STUDY N/A 05/30/2015   Atrial fibrillation ablation by Dr Rayann Heman   ESOPHAGOGASTRODUODENOSCOPY N/A 08/24/2015   Dr. Gala Romney: Medium-sized hiatal hernia, erosive gastropathy. Cameron lesions. Esophageal mucosa distally suggestive of short segment Barrett's esophagus. Not confirmed on biopsy. Gastric biopsy with minimal chronic inflammation   GIVENS CAPSULE STUDY N/A 04/17/2016    Procedure: GIVENS CAPSULE STUDY;  Surgeon: Daneil Dolin, MD;  [facility];  Service: Endoscopy;  Laterality: N/A;  Pt to arrive at 8:00 am for 8:30 am appt   LEFT HEART CATH AND CORONARY ANGIOGRAPHY N/A 05/11/2018   Procedure: LEFT HEART CATH AND CORONARY ANGIOGRAPHY;  Surgeon: Belva Crome, MD;  Location: [place] CV LAB;  Service: Cardiovascular;  Laterality: N/A;   TEE WITHOUT CARDIOVERSION N/A 04/12/2015   Procedure: TRANSESOPHAGEAL ECHOCARDIOGRAM (TEE);  Surgeon: Larey Dresser, MD;  Location: [facility] ENDOSCOPY;  Service: Cardiovascular;  Laterality: N/A;     No outpatient medications have been marked as taking for the 07/29/18 encounter (Appointment) with Lorretta Harp, MD.     Allergies:   Patient has no known allergies.   Social History   Tobacco Use   Smoking status: Former Smoker    Packs/day: 1.00    Years: 30.00    Pack years: 30.00    Types: Cigarettes    Last attempt to quit: 03/21/2003    Years since quitting: 15.3   Smokeless tobacco: Never Used  Substance Use Topics   Alcohol use: No    Alcohol/week: 0.0 standard drinks    Comment: H/O case of beer weekly x 20 years, quiting in 2000-ish.   Drug use: No    Comment: H/O  marijuana use many years ago.     Family Hx: The patient's family history includes Hypertension in his mother. There is no history of Colon cancer.  ROS:   Please see the history of present illness.     All other systems reviewed and are negative.   Prior CV studies:   The following studies were reviewed today:  None  Labs/Other Tests and Data Reviewed:    EKG:  No ECG reviewed.  Recent Labs: 05/09/2018: ALT 29; B Natriuretic Peptide 636.2; TSH 2.689 05/13/2018: Hemoglobin 15.6; Platelets 256 05/18/2018: BUN 12; Creatinine, Ser 0.79; Magnesium 2.0; Potassium 4.0; Sodium 136   Recent Lipid Panel Lab Results  Component Value Date/Time   CHOL 130 05/09/2018 05:45 AM   TRIG 40 05/09/2018 05:45 AM   HDL 51 05/09/2018  05:45 AM   CHOLHDL 2.5 05/09/2018 05:45 AM   LDLCALC 71 05/09/2018 05:45 AM    Wt Readings from Last 3 Encounters:  07/28/18 200 lb (90.7 kg)  05/28/18 196 lb (88.9 kg)  05/18/18 196 lb (88.9 kg)     Objective:    Vital Signs:  There were no vitals taken for this visit.   VITAL SIGNS:  reviewed a complete physical exam was not performed today since this was a virtual telemedicine phone visit  ASSESSMENT & PLAN:    1. Coronary artery disease- history of CAD status post inferolateral STEMI 02/22/2015.  I stented his proximal dominant circumflex and first obtuse marginal branch with drug-eluting stents (T stenting).  He had a 90% stenosis in the mid nondominant RCA and 80% proximal LAD stenosis with an EF of 35 to 40% at that time.  Subsequent Myoview stress test was nonischemic.  He was readmitted with a non-STEMI 05/09/2018.  He was in A. fib with RVR.  He was catheterized 05/11/2018 by Dr. Tamala Julian revealing a 99% "in-stent restenosis" within the proximal circumflex stent as well as obtuse marginal branch stent.  He did have disease in his RCA and LAD as well with an EF of 35 to 45%.  After consideration of revascularization strategies it was decided to proceed with angioplasty the following day which was done with a balloon only by Dr. Tamala Julian with an acceptable angiographic result.  He has had no residual chest pain. 2. Paroxysmal atrial fibrillation- status post atrial fibrillation ablation by Dr. Rayann Heman 05/30/2015.  He did have recurrent A. fib in the setting of non-STEMI 05/09/2018 and was placed on sotalol at that time.  He is on Eliquis oral anticoagulation. 3. Essential hypertension- history of essential hypertension with blood pressure measured by the patient today at 156/65 with a pulse of 45. 4. Hyperlipidemia- history of hyperlipidemia on atorvastatin with lipid profile performed 05/09/2018 revealing total cholesterol 130, LDL 71 and HDL 51.  COVID-19 Education: The signs and symptoms of  COVID-19 were discussed with the patient and how to seek care for testing (follow up with PCP or arrange E-visit).  The importance of social distancing was discussed today.  Time:   Today, I have spent 6 minutes with the patient with telehealth technology discussing the above problems.     Medication Adjustments/Labs and Tests Ordered: Current medicines are reviewed at length with the patient today.  Concerns regarding medicines are outlined above.   Tests Ordered: No orders of the defined types were placed in this encounter.   Medication Changes: No orders of the defined types were placed in this encounter.   Disposition:  Follow up in 3 month(s)  Signed, Roderic Palau  [name], MD  07/29/2018 8:16 AM    [place]

## 2018-07-30 ENCOUNTER — Telehealth: Payer: Self-pay

## 2018-07-30 NOTE — Telephone Encounter (Signed)
Called pt and spoke to wife. Reviewed pt current med list with pt wife. The only med not on pt med list is carvedilol. Pt wife has already been educated on weaning instructions per Dr. [name].

## 2018-08-03 ENCOUNTER — Telehealth: Payer: Self-pay | Admitting: Licensed Clinical Social Worker

## 2018-08-03 NOTE — Telephone Encounter (Signed)
CSW referred to assist patient with obtaining a BP cuff. CSW contacted patient to inform cuff will be delivered to home next week. Patient grateful for support and assistance. CSW available as needed. Jackie Lolamae Voisin, LCSW, CCSW-MCS [phone]  

## 2018-08-07 ENCOUNTER — Telehealth: Payer: Self-pay | Admitting: Pharmacist

## 2018-08-07 NOTE — Telephone Encounter (Signed)
*   Talked to Ms Fatzinger*  Patent following instructions to wean off carvedilol without problems. Started carvedilol 1/2 tablet daily today and plan to take last dose on June/12 as instructed.   They don't have home BP machine yet but got approval from  [facility] for deliver.  Instructed to check BP and keep records to discuss during follow up in 2 weeks (will check BP twice daily for 1 week, than daily until  follow up).

## 2018-08-17 ENCOUNTER — Other Ambulatory Visit (HOSPITAL_COMMUNITY): Payer: Self-pay | Admitting: *Deleted

## 2018-08-17 DIAGNOSIS — D72823 Leukemoid reaction: Secondary | ICD-10-CM

## 2018-08-17 DIAGNOSIS — D508 Other iron deficiency anemias: Secondary | ICD-10-CM

## 2018-08-17 DIAGNOSIS — D72829 Elevated white blood cell count, unspecified: Secondary | ICD-10-CM

## 2018-08-18 ENCOUNTER — Other Ambulatory Visit: Payer: Self-pay

## 2018-08-18 ENCOUNTER — Inpatient Hospital Stay (HOSPITAL_COMMUNITY): Payer: Medicare HMO | Attending: Hematology

## 2018-08-18 DIAGNOSIS — Z79899 Other long term (current) drug therapy: Secondary | ICD-10-CM | POA: Diagnosis not present

## 2018-08-18 DIAGNOSIS — Z7901 Long term (current) use of anticoagulants: Secondary | ICD-10-CM | POA: Insufficient documentation

## 2018-08-18 DIAGNOSIS — D72823 Leukemoid reaction: Secondary | ICD-10-CM

## 2018-08-18 DIAGNOSIS — Z8249 Family history of ischemic heart disease and other diseases of the circulatory system: Secondary | ICD-10-CM | POA: Insufficient documentation

## 2018-08-18 DIAGNOSIS — Z87891 Personal history of nicotine dependence: Secondary | ICD-10-CM | POA: Insufficient documentation

## 2018-08-18 DIAGNOSIS — D72829 Elevated white blood cell count, unspecified: Secondary | ICD-10-CM | POA: Diagnosis not present

## 2018-08-18 DIAGNOSIS — I10 Essential (primary) hypertension: Secondary | ICD-10-CM | POA: Diagnosis not present

## 2018-08-18 DIAGNOSIS — I4891 Unspecified atrial fibrillation: Secondary | ICD-10-CM | POA: Diagnosis not present

## 2018-08-18 DIAGNOSIS — L4050 Arthropathic psoriasis, unspecified: Secondary | ICD-10-CM | POA: Diagnosis not present

## 2018-08-18 DIAGNOSIS — D508 Other iron deficiency anemias: Secondary | ICD-10-CM

## 2018-08-18 DIAGNOSIS — M069 Rheumatoid arthritis, unspecified: Secondary | ICD-10-CM | POA: Insufficient documentation

## 2018-08-18 DIAGNOSIS — I252 Old myocardial infarction: Secondary | ICD-10-CM | POA: Diagnosis not present

## 2018-08-18 DIAGNOSIS — K219 Gastro-esophageal reflux disease without esophagitis: Secondary | ICD-10-CM | POA: Diagnosis not present

## 2018-08-18 DIAGNOSIS — E785 Hyperlipidemia, unspecified: Secondary | ICD-10-CM | POA: Insufficient documentation

## 2018-08-18 LAB — CBC WITH DIFFERENTIAL/PLATELET
Abs Immature Granulocytes: 0.15 10*3/uL — ABNORMAL HIGH (ref 0.00–0.07)
Basophils Absolute: 0.1 10*3/uL (ref 0.0–0.1)
Basophils Relative: 0 %
Eosinophils Absolute: 0.1 10*3/uL (ref 0.0–0.5)
Eosinophils Relative: 1 %
HCT: 47.3 % (ref 39.0–52.0)
Hemoglobin: 15.2 g/dL (ref 13.0–17.0)
Immature Granulocytes: 1 %
Lymphocytes Relative: 27 %
Lymphs Abs: 4.5 10*3/uL — ABNORMAL HIGH (ref 0.7–4.0)
MCH: 28.8 pg (ref 26.0–34.0)
MCHC: 32.1 g/dL (ref 30.0–36.0)
MCV: 89.8 fL (ref 80.0–100.0)
Monocytes Absolute: 1 10*3/uL (ref 0.1–1.0)
Monocytes Relative: 6 %
Neutro Abs: 11.1 10*3/uL — ABNORMAL HIGH (ref 1.7–7.7)
Neutrophils Relative %: 65 %
Platelets: 250 10*3/uL (ref 150–400)
RBC: 5.27 MIL/uL (ref 4.22–5.81)
RDW: 14.6 % (ref 11.5–15.5)
WBC: 17 10*3/uL — ABNORMAL HIGH (ref 4.0–10.5)
nRBC: 0 % (ref 0.0–0.2)

## 2018-08-18 LAB — COMPREHENSIVE METABOLIC PANEL
ALT: 31 U/L (ref 0–44)
AST: 30 U/L (ref 15–41)
Albumin: 3.8 g/dL (ref 3.5–5.0)
Alkaline Phosphatase: 63 U/L (ref 38–126)
Anion gap: 11 (ref 5–15)
BUN: 12 mg/dL (ref 8–23)
CO2: 26 mmol/L (ref 22–32)
Calcium: 9.3 mg/dL (ref 8.9–10.3)
Chloride: 105 mmol/L (ref 98–111)
Creatinine, Ser: 0.79 mg/dL (ref 0.61–1.24)
GFR calc Af Amer: >60 mL/min (ref >60)
GFR calc non Af Amer: >60 mL/min (ref >60)
Glucose, Bld: 100 mg/dL — ABNORMAL HIGH (ref 70–99)
Potassium: 4.4 mmol/L (ref 3.5–5.1)
Sodium: 142 mmol/L (ref 135–145)
Total Bilirubin: 0.5 mg/dL (ref 0.3–1.2)
Total Protein: 6.9 g/dL (ref 6.5–8.1)

## 2018-08-18 LAB — LACTATE DEHYDROGENASE: LDH: 207 U/L — ABNORMAL HIGH (ref 98–192)

## 2018-08-25 ENCOUNTER — Encounter (HOSPITAL_COMMUNITY): Payer: Self-pay | Admitting: Hematology

## 2018-08-25 ENCOUNTER — Inpatient Hospital Stay (HOSPITAL_BASED_OUTPATIENT_CLINIC_OR_DEPARTMENT_OTHER): Payer: Medicare HMO | Admitting: Hematology

## 2018-08-25 ENCOUNTER — Other Ambulatory Visit: Payer: Self-pay

## 2018-08-25 VITALS — BP 138/65 | HR 91 | Temp 99.1°F | Resp 16 | Wt 198.0 lb

## 2018-08-25 DIAGNOSIS — Z7901 Long term (current) use of anticoagulants: Secondary | ICD-10-CM

## 2018-08-25 DIAGNOSIS — M069 Rheumatoid arthritis, unspecified: Secondary | ICD-10-CM | POA: Diagnosis not present

## 2018-08-25 DIAGNOSIS — I10 Essential (primary) hypertension: Secondary | ICD-10-CM

## 2018-08-25 DIAGNOSIS — I252 Old myocardial infarction: Secondary | ICD-10-CM

## 2018-08-25 DIAGNOSIS — E785 Hyperlipidemia, unspecified: Secondary | ICD-10-CM

## 2018-08-25 DIAGNOSIS — D72829 Elevated white blood cell count, unspecified: Secondary | ICD-10-CM

## 2018-08-25 DIAGNOSIS — L4050 Arthropathic psoriasis, unspecified: Secondary | ICD-10-CM | POA: Diagnosis not present

## 2018-08-25 DIAGNOSIS — Z87891 Personal history of nicotine dependence: Secondary | ICD-10-CM

## 2018-08-25 DIAGNOSIS — Z79899 Other long term (current) drug therapy: Secondary | ICD-10-CM

## 2018-08-25 DIAGNOSIS — Z8249 Family history of ischemic heart disease and other diseases of the circulatory system: Secondary | ICD-10-CM

## 2018-08-25 DIAGNOSIS — I4891 Unspecified atrial fibrillation: Secondary | ICD-10-CM

## 2018-08-25 DIAGNOSIS — K219 Gastro-esophageal reflux disease without esophagitis: Secondary | ICD-10-CM

## 2018-08-25 NOTE — Progress Notes (Signed)
Curtis Flores, [place] [id number]   CLINIC:  Medical Oncology/Hematology  PCP:  Curtis Housekeeper, MD [place] [address] [phone]   REASON FOR VISIT:  Follow-up for Leukocytosis   CURRENT THERAPY: Clinical Surveillance     INTERVAL HISTORY:  Curtis Flores 63 y.o. male presents today for follow up. He reports over all doing well. Denies any significant fatigue. Currently on prednisone for Rheumatoid and Psoriatic arthritis. He states his arthritis is under control. He denies any fevers, chills, night sweats. No weight loss. No lymphadenopathy. No recent infections. He is here for repeat labs and office visit.    REVIEW OF SYSTEMS:  Review of Systems  Musculoskeletal: Positive for arthralgias.  All other systems reviewed and are negative.    PAST MEDICAL/SURGICAL HISTORY:  Past Medical History:  Diagnosis Date  . Abnormal CT scan, stomach   . Chronic systolic dysfunction of left ventricle   . Coronary artery disease    lateral STEMI 02/22/15 3/10 cutting balloon to ISR of the o/pLCX  . GERD (gastroesophageal reflux disease)   . Hyperlipidemia   . Hypertension   . Ischemic cardiomyopathy   . LBBB (left bundle branch block)   . Leukocytosis    followed by hematology, reactive  . Lymphadenopathy   . Myocardial infarction ([place]) 02/2015  . Psoriasis 2003  . Psoriatic arthritis ([place])   . Pulmonary fibrosis ([place])   . Rheumatoid arthritis(714.0) 2012  . Typical atrial flutter [facility])    Past Surgical History:  Procedure Laterality Date  . CARDIAC CATHETERIZATION N/A 02/22/2015   Procedure: Left Heart Cath and Coronary Angiography;  Surgeon: Lorretta Harp, MD;  Location: [place] CV LAB;  Service: Cardiovascular;  Laterality: N/A;  . CARDIAC CATHETERIZATION N/A 02/22/2015   Procedure: Coronary Stent Intervention;  Surgeon: Lorretta Harp, MD;  Location: [place] CV LAB;  Service: Cardiovascular;  Laterality:  N/A;  . CARDIOVERSION N/A 04/12/2015   Procedure: CARDIOVERSION;  Surgeon: Larey Dresser, MD;  Location: [facility] ENDOSCOPY;  Service: Cardiovascular;  Laterality: N/A;  . COLONOSCOPY  07/01/2003   VOZ:[id number] colonic mucosa except for the proximal right colon in the area of ICV which was not seen completely due to inadequate bowel prep. followed with ACBE which was normal.   . COLONOSCOPY N/A 08/24/2015   Dr. Gala Romney: Normal colon. Next colonoscopy in 10 years.  . CORONARY BALLOON ANGIOPLASTY N/A 05/12/2018   Procedure: CORONARY BALLOON ANGIOPLASTY;  Surgeon: Belva Crome, MD;  Location: [place] CV LAB;  Service: Cardiovascular;  Laterality: N/A;  . ELECTROPHYSIOLOGIC STUDY N/A 05/30/2015   Atrial fibrillation ablation by Dr Rayann Heman  . ESOPHAGOGASTRODUODENOSCOPY N/A 08/24/2015   Dr. Gala Romney: Medium-sized hiatal hernia, erosive gastropathy. Cameron lesions. Esophageal mucosa distally suggestive of short segment Barrett's esophagus. Not confirmed on biopsy. Gastric biopsy with minimal chronic inflammation  . GIVENS CAPSULE STUDY N/A 04/17/2016   Procedure: GIVENS CAPSULE STUDY;  Surgeon: Daneil Dolin, MD;  [facility];  Service: Endoscopy;  Laterality: N/A;  Pt to arrive at 8:00 am for 8:30 am appt  . LEFT HEART CATH AND CORONARY ANGIOGRAPHY N/A 05/11/2018   Procedure: LEFT HEART CATH AND CORONARY ANGIOGRAPHY;  Surgeon: Belva Crome, MD;  Location: [place] CV LAB;  Service: Cardiovascular;  Laterality: N/A;  . TEE WITHOUT CARDIOVERSION N/A 04/12/2015   Procedure: TRANSESOPHAGEAL ECHOCARDIOGRAM (TEE);  Surgeon: Larey Dresser, MD;  Location: [place];  Service: Cardiovascular;  Laterality: N/A;  SOCIAL HISTORY:  Social History   Socioeconomic History  . Marital status: Married    Spouse name: Not on file  . Number of children: 2  . Years of education: Not on file  . Highest education level: Not on file  Occupational History  . 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 Financial resource strain: Not on file  . Food insecurity    Worry: Not on file    Inability: Not on file  . Transportation needs    Medical: Not on file    Non-medical: Not on file  Tobacco Use  . Smoking status: Former Smoker    Packs/day: 1.00    Years: 30.00    Pack years: 30.00    Types: Cigarettes    Quit date: 03/21/2003    Years since quitting: 15.4  . Smokeless tobacco: Never Used  Substance and Sexual Activity  . Alcohol use: No    Alcohol/week: 0.0 standard drinks    Comment: H/O case of beer weekly x 20 years, quiting in 2000-ish.  . Drug use: No    Comment: H/O marijuana use many years ago.  . Sexual activity: Yes    Birth control/protection: None  Lifestyle  . Physical activity    Days per week: Not on file    Minutes per session: Not on file  . Stress: Not on file  Relationships  . Social connections    Talks on phone: Not on file    Gets together: Not on file    Attends religious service: Not on file    Active member of club or organization: Not on file    Attends meetings of clubs or organizations: Not on file    Relationship status: Not on file  . Intimate partner violence    Fear of current or ex partner: Not on file    Emotionally abused: Not on file    Physically abused: Not on file    Forced sexual activity: Not on file  Other Topics Concern  . Not on file  Social History Narrative  . Not on file    FAMILY HISTORY:  Family History  Problem Relation Age of Onset  . Hypertension Mother   . Colon cancer Neg Hx     CURRENT MEDICATIONS:  Outpatient Encounter Medications as of 08/25/2018  Medication Sig  . Abatacept (ORENCIA CLICKJECT) 125 MG/ML SOAJ Inject 125 mg into the skin once a week. (Patient taking differently: Inject 125 mg into the skin once a week. Every Friday)  . amLODipine (NORVASC) 5 MG tablet Take 1 tablet (5 mg total) by mouth daily.  .  apixaban (ELIQUIS) 5 MG TABS tablet Take 1 tablet (5 mg total) by mouth 2 (two) times daily.  . atorvastatin (LIPITOR) 80 MG tablet TAKE 1 TABLET (80 MG TOTAL) BY MOUTH DAILY AT 6 PM.  . azaTHIOprine (IMURAN) 50 MG tablet TAKE 1 TABLET TWICE DAILY  . Clobetasol Prop Emollient Base 0.05 % emollient cream APPLY TO AFFECTED AREA TWICE A DAY  . clopidogrel (PLAVIX) 75 MG tablet TAKE 1 TABLET BY MOUTH EVERY DAY  . furosemide (LASIX) 40 MG tablet Take 1 tablet (40 mg total) by mouth 2 (two) times daily.  . losartan (COZAAR) 100 MG tablet Take 1 tablet (100 mg total) by mouth daily.  . pantoprazole (PROTONIX) 40 MG tablet Take 1 tablet (40 mg total) by mouth   2 (two) times daily before a meal.  . potassium chloride SA (K-DUR) 20 MEQ tablet Take 2 tablets (40 mEq total) by mouth 2 (two) times daily.  . predniSONE (DELTASONE) 10 MG tablet Take 1 tablet (10 mg total) by mouth daily with breakfast.  . predniSONE (DELTASONE) 5 MG tablet Take 1.5 tablets by mouth daily with breakfast.  . sotalol (BETAPACE) 120 MG tablet Take 1 tablet (120 mg total) by mouth every 12 (twelve) hours.   No facility-administered encounter medications on file as of 08/25/2018.     ALLERGIES:  No Known Allergies   PHYSICAL EXAM:  ECOG Performance status: 1  Vitals:   08/25/18 1500  BP: 138/65  Pulse: 91  Resp: 16  Temp: 99.1 F (37.3 C)  SpO2: 96%   Filed Weights   08/25/18 1500  Weight: 198 lb (89.8 kg)    Physical Exam Constitutional:      Appearance: Normal appearance.  Neck:     Musculoskeletal: Normal range of motion.  Cardiovascular:     Rate and Rhythm: Normal rate and regular rhythm.     Heart sounds: Normal heart sounds.  Pulmonary:     Effort: Pulmonary effort is normal.     Breath sounds: Normal breath sounds.  Abdominal:     General: There is no distension.     Palpations: Abdomen is soft. There is no mass.  Musculoskeletal: Normal range of motion.  Skin:    General: Skin is warm.   Neurological:     General: No focal deficit present.     Mental Status: He is alert and oriented to person, place, and time.  Psychiatric:        Mood and Affect: Mood normal.        Behavior: Behavior normal.      LABORATORY DATA:  I have reviewed the labs as listed.  CBC    Component Value Date/Time   WBC 17.0 (H) 08/18/2018 1400   RBC 5.27 08/18/2018 1400   HGB 15.2 08/18/2018 1400   HGB 14.5 08/12/2011 1256   HCT 47.3 08/18/2018 1400   HCT 44.7 08/12/2011 1256   PLT 250 08/18/2018 1400   PLT 240 08/12/2011 1256   MCV 89.8 08/18/2018 1400   MCV 82.2 08/12/2011 1256   MCH 28.8 08/18/2018 1400   MCHC 32.1 08/18/2018 1400   RDW 14.6 08/18/2018 1400   RDW 14.7 (H) 08/12/2011 1256   LYMPHSABS 4.5 (H) 08/18/2018 1400   LYMPHSABS 5.5 (H) 08/12/2011 1256   MONOABS 1.0 08/18/2018 1400   MONOABS 1.6 (H) 08/12/2011 1256   EOSABS 0.1 08/18/2018 1400   EOSABS 0.2 08/12/2011 1256   BASOSABS 0.1 08/18/2018 1400   BASOSABS 0.1 08/12/2011 1256   CMP Latest Ref Rng & Units 08/18/2018 05/18/2018 05/13/2018  Glucose 70 - 99 mg/dL 100(H) 101(H) 82  BUN 8 - 23 mg/dL 12 12 13  Creatinine 0.61 - 1.24 mg/dL 0.79 0.79 1.00  Sodium 135 - 145 mmol/L 142 136 140  Potassium 3.5 - 5.1 mmol/L 4.4 4.0 4.4  Chloride 98 - 111 mmol/L 105 101 103  CO2 22 - 32 mmol/L 26 30 29  Calcium 8.9 - 10.3 mg/dL 9.3 9.6 9.8  Total Protein 6.5 - 8.1 g/dL 6.9 - -  Total Bilirubin 0.3 - 1.2 mg/dL 0.5 - -  Alkaline Phos 38 - 126 U/L 63 - -  AST 15 - 41 U/L 30 - -  ALT 0 - 44 U/L 31 - -          ASSESSMENT & PLAN:   Leukocytosis 1.  Leukocytosis: -  Pt was previously followed by Dr. Zhou.  - He had a Negative workup in 2013, including negative bone marrow biopsy. - Peripheral flow cytometry 2016 negative for monoclonal B cell population.   - BCR/ABL: negative.  Jak 2 negative.   - WBC is stable: 17, neutrophils: 11.1 and Lymphocytes:4.5. Leukocytosis is most likely secondary to demarginalization caused by  steroid use.   2.  Rheumatoid arthritis/Psoriatic arthritis.  -  Pt remains on prednisone.  Follow-up with PCP or rheumatologist as directed.             Orders placed this encounter:  Orders Placed This Encounter  Procedures  . CBC with Differential  . Comprehensive metabolic panel  . Lactate dehydrogenase      Sreedhar Katragadda, MD [facility] [phone]   

## 2018-08-25 NOTE — Assessment & Plan Note (Addendum)
1.  Leukocytosis: -  Pt was previously followed by Dr. Talbert Cage.  - He had a Negative workup in 2013, including negative bone marrow biopsy. - Peripheral flow cytometry 2016 negative for monoclonal B cell population.   - BCR/ABL: negative.  Jak 2 negative.   - WBC is stable: 17, neutrophils: 11.1 and Lymphocytes:4.5. Leukocytosis is most likely secondary to demarginalization caused by steroid use. -We will monitor blood counts in 6 months.  He was told to come back sooner should he develop any fevers, night sweats or unintentional weight loss.  2.  Rheumatoid arthritis/Psoriatic arthritis.  - He is taking prednisone 10 mg daily at this time.  He reports that he has been on prednisone for the last 2 to 3 years. -His symptoms are very well controlled.

## 2018-08-28 ENCOUNTER — Telehealth: Payer: Self-pay | Admitting: Pharmacist

## 2018-08-28 NOTE — Telephone Encounter (Signed)
*  Telephone follow up*  Patient feeling good. Carvedilol stopped.   Reported readings: 6/22: 108/52 (48) 6/23: 121/61 (45) 6/24: 131/63 (45) 6/25: 119/58 (45)  Recommendations:    Continue all medication as prescribed. Keep follow up with Afib clinic as scheduled for 09/30/2018

## 2018-09-08 ENCOUNTER — Telehealth: Payer: Self-pay | Admitting: Cardiovascular Disease

## 2018-09-08 NOTE — Telephone Encounter (Signed)
New Message      Patient's wife states she sent form in for Ilda Foil to be filled out by Dr. [name] and she wants to see if it was completed so it can be sent in for patient to get his medication.

## 2018-09-08 NOTE — Telephone Encounter (Signed)
Sent to primary nurse 

## 2018-09-09 NOTE — Telephone Encounter (Signed)
Unable to LVM on home phone: [phone]. lmtcb on mobile phone: [phone]

## 2018-09-11 NOTE — Telephone Encounter (Signed)
Follow up    Patient is following in reference to form that that needs to be filled out so that patient can get assistance with the cost of Eliquis from [facility]. Please call.

## 2018-09-11 NOTE — Telephone Encounter (Signed)
Spoke with pt wife who states a [employer] rep assisted her in completing pt section of PA form for pt and she was instructed to mail it to [employer]. She was told that the form would be sent to Dr. Kennon Holter office for his section to be completed. Informed pt wife that the current form available was faxed from [facility] on 7/10 and was not completed. She states she does not know why that is so. Informed her that nurse would search to see if any additional forms faxed to office for pt and would reach out to company regarding getting completed form faxed over. Informed pt wife that nurse would call to update her on what is going on. Pt wife agreeable

## 2018-09-15 ENCOUNTER — Telehealth: Payer: Self-pay

## 2018-09-15 NOTE — Telephone Encounter (Signed)
Contacted [place] and spoke with rep regarding pt assistance form. Rep stated that they received pt section of form and just need the provider section completed and faxed to [phone].  Contacted pt and updated him on status of form and informed him that nurse would have form completed and faxed to [facility] today. Pt verbalized understanding.

## 2018-09-19 ENCOUNTER — Other Ambulatory Visit: Payer: Self-pay | Admitting: Rheumatology

## 2018-09-19 DIAGNOSIS — D508 Other iron deficiency anemias: Secondary | ICD-10-CM

## 2018-09-21 ENCOUNTER — Telehealth: Payer: Self-pay | Admitting: Rheumatology

## 2018-09-21 MED ORDER — PREDNISONE 5 MG PO TABS: ORAL_TABLET | ORAL | 0 refills | Status: DC

## 2018-09-21 NOTE — Telephone Encounter (Signed)
Curtis Flores called requesting prescription refill of Prednisone 10 mg to be sent to [facility] in [place]

## 2018-09-21 NOTE — Telephone Encounter (Signed)
Ok to refill Imuran.

## 2018-09-21 NOTE — Telephone Encounter (Signed)
Last Visit: 07/28/2018 Next Visit: 12/01/2018 Labs: 08/18/2018 WBC 17.0, neutro abs 11.1, lymphs abs 4.5, abs immature granulocytes 0.15, elevated glucose.   Okay to refill imuran?

## 2018-09-21 NOTE — Telephone Encounter (Signed)
On 07/28/2018 prednisone was decreased to 7.5mg  po qd. Per patient, he did fine on the 7.5mg  daily but when he ran out, he increased his  Prednisone back to 10mg  daily just because he had 10mg  tablets on hand. Discussed this with Hazel Sams, PA-C and we will send in 7.5mg  po qd for patient. Advised patient that he will be taking 7.5mg  daily and to call with any increased joint pain and swelling.

## 2018-09-30 ENCOUNTER — Ambulatory Visit (HOSPITAL_COMMUNITY)
Admission: RE | Admit: 2018-09-30 | Discharge: 2018-09-30 | Disposition: A | Discharge status: Home / Self Care | Payer: Medicare HMO | Source: Ambulatory Visit | Attending: Nurse Practitioner | Admitting: Nurse Practitioner

## 2018-09-30 ENCOUNTER — Other Ambulatory Visit: Payer: Self-pay

## 2018-09-30 ENCOUNTER — Encounter (HOSPITAL_COMMUNITY): Payer: Self-pay | Admitting: Nurse Practitioner

## 2018-09-30 VITALS — BP 146/64 | HR 42 | Ht 67.0 in | Wt 199.0 lb

## 2018-09-30 DIAGNOSIS — Z79899 Other long term (current) drug therapy: Secondary | ICD-10-CM | POA: Insufficient documentation

## 2018-09-30 DIAGNOSIS — I10 Essential (primary) hypertension: Secondary | ICD-10-CM | POA: Diagnosis not present

## 2018-09-30 DIAGNOSIS — Z955 Presence of coronary angioplasty implant and graft: Secondary | ICD-10-CM | POA: Diagnosis not present

## 2018-09-30 DIAGNOSIS — I4891 Unspecified atrial fibrillation: Secondary | ICD-10-CM | POA: Diagnosis present

## 2018-09-30 DIAGNOSIS — Z7952 Long term (current) use of systemic steroids: Secondary | ICD-10-CM | POA: Insufficient documentation

## 2018-09-30 DIAGNOSIS — E785 Hyperlipidemia, unspecified: Secondary | ICD-10-CM | POA: Diagnosis not present

## 2018-09-30 DIAGNOSIS — Z8249 Family history of ischemic heart disease and other diseases of the circulatory system: Secondary | ICD-10-CM | POA: Diagnosis not present

## 2018-09-30 DIAGNOSIS — I255 Ischemic cardiomyopathy: Secondary | ICD-10-CM | POA: Diagnosis not present

## 2018-09-30 DIAGNOSIS — M069 Rheumatoid arthritis, unspecified: Secondary | ICD-10-CM | POA: Diagnosis not present

## 2018-09-30 DIAGNOSIS — I2510 Atherosclerotic heart disease of native coronary artery without angina pectoris: Secondary | ICD-10-CM | POA: Diagnosis not present

## 2018-09-30 DIAGNOSIS — K219 Gastro-esophageal reflux disease without esophagitis: Secondary | ICD-10-CM | POA: Insufficient documentation

## 2018-09-30 DIAGNOSIS — Z7901 Long term (current) use of anticoagulants: Secondary | ICD-10-CM | POA: Diagnosis not present

## 2018-09-30 DIAGNOSIS — J8410 Pulmonary fibrosis, unspecified: Secondary | ICD-10-CM | POA: Insufficient documentation

## 2018-09-30 DIAGNOSIS — Z7902 Long term (current) use of antithrombotics/antiplatelets: Secondary | ICD-10-CM | POA: Insufficient documentation

## 2018-09-30 DIAGNOSIS — I447 Left bundle-branch block, unspecified: Secondary | ICD-10-CM | POA: Diagnosis not present

## 2018-09-30 DIAGNOSIS — I252 Old myocardial infarction: Secondary | ICD-10-CM | POA: Diagnosis not present

## 2018-09-30 DIAGNOSIS — Z87891 Personal history of nicotine dependence: Secondary | ICD-10-CM | POA: Diagnosis not present

## 2018-09-30 DIAGNOSIS — L4050 Arthropathic psoriasis, unspecified: Secondary | ICD-10-CM | POA: Diagnosis not present

## 2018-09-30 LAB — MAGNESIUM: Magnesium: 2.1 mg/dL (ref 1.7–2.4)

## 2018-09-30 LAB — BASIC METABOLIC PANEL
Anion gap: 5 (ref 5–15)
BUN: 11 mg/dL (ref 8–23)
CO2: 30 mmol/L (ref 22–32)
Calcium: 9.6 mg/dL (ref 8.9–10.3)
Chloride: 107 mmol/L (ref 98–111)
Creatinine, Ser: 0.78 mg/dL (ref 0.61–1.24)
GFR calc Af Amer: >60 mL/min (ref >60)
GFR calc non Af Amer: >60 mL/min (ref >60)
Glucose, Bld: 110 mg/dL — ABNORMAL HIGH (ref 70–99)
Potassium: 4.6 mmol/L (ref 3.5–5.1)
Sodium: 142 mmol/L (ref 135–145)

## 2018-09-30 NOTE — Progress Notes (Addendum)
Primary Care Physician: Dione Housekeeper, MD Referring Physician: [facility] f/u EP: Dr. Rayann Heman Cardiologist:Dr. Zettie Pho is a 63 y.o. male with a h/o 63 y.o. male w/PMHx of HTN, HLD, CAD, pulmonary fibrosis, AFlutter (ablated 2017), LBBB, ICM, RA, sought attention for progressive severe chest pain for which he presented to [facility] ED and was found to have afib with RVR. He had hypertensive urgency and NSTEMI. He was transferred to [facility] for further evaluation.  He underwent LHC and was thought not to be a surgical candidate for Bypass 2/2 pulmonary fibrosis. He underwent successful scoring balloon angioplasty of in-stent restenosis involving the ostium to proximal circumflex reducing a 99% stenosis to 40% with TIMI grade III flow. The ostial stenosis in the first obtuse marginal could not be crossed with the balloon to perform angioplasty.  He was seen in consult by Dr. Rayann Heman for afib with RVR and was started on sotalol. He had brady with this and BB was stopped. He was placed on apixaban and clopidogrel, ASA was not used to avoid bleeding risks.  In f/u in afib clinic, he feels well, has not had any further chest pain or awareness of of irregular heart beat. He is in S brady at 43 bpm, although he is not symptomatic with this.  F/u afib clinic, 10/01/18. He reports no concerns.He is staying in rhythm, he is compliant with sotalol and eliquis. No bleeding issues. He still has bradycardia in the 40's but remains asymptomatic.  Today, he denies symptoms of palpitations, chest pain, shortness of breath, orthopnea, PND, lower extremity edema, dizziness, presyncope, syncope, or neurologic sequela. The patient is tolerating medications without difficulties and is otherwise without complaint today.   Past Medical History:  Diagnosis Date  . Abnormal CT scan, stomach   . Chronic systolic dysfunction of left ventricle   . Coronary artery disease    lateral STEMI 02/22/15 3/10  cutting balloon to ISR of the o/pLCX  . GERD (gastroesophageal reflux disease)   . Hyperlipidemia   . Hypertension   . Ischemic cardiomyopathy   . LBBB (left bundle branch block)   . Leukocytosis    followed by hematology, reactive  . Lymphadenopathy   . Myocardial infarction ([place]) 02/2015  . Psoriasis 2003  . Psoriatic arthritis ([place])   . Pulmonary fibrosis ([place])   . Rheumatoid arthritis(714.0) 2012  . Typical atrial flutter [facility])    Past Surgical History:  Procedure Laterality Date  . CARDIAC CATHETERIZATION N/A 02/22/2015   Procedure: Left Heart Cath and Coronary Angiography;  Surgeon: Lorretta Harp, MD;  Location: [place] CV LAB;  Service: Cardiovascular;  Laterality: N/A;  . CARDIAC CATHETERIZATION N/A 02/22/2015   Procedure: Coronary Stent Intervention;  Surgeon: Lorretta Harp, MD;  Location: [place] [facility];  Service: Cardiovascular;  Laterality: N/A;  . CARDIOVERSION N/A 04/12/2015   Procedure: CARDIOVERSION;  Surgeon: Larey Dresser, MD;  Location: [facility] ENDOSCOPY;  Service: Cardiovascular;  Laterality: N/A;  . COLONOSCOPY  07/01/2003   ZDG:UYQIHK colonic mucosa except for the proximal right colon in the area of ICV which was not seen completely due to inadequate bowel prep. followed with ACBE which was normal.   . COLONOSCOPY N/A 08/24/2015   Dr. Gala Romney: Normal colon. Next colonoscopy in 10 years.  . CORONARY BALLOON ANGIOPLASTY N/A 05/12/2018   Procedure: CORONARY BALLOON ANGIOPLASTY;  Surgeon: Belva Crome, MD;  Location: [place] [facility] LAB;  Service: Cardiovascular;  Laterality: N/A;  . ELECTROPHYSIOLOGIC STUDY N/A  05/30/2015   Atrial fibrillation ablation by Dr Rayann Heman  . ESOPHAGOGASTRODUODENOSCOPY N/A 08/24/2015   Dr. Gala Romney: Medium-sized hiatal hernia, erosive gastropathy. Cameron lesions. Esophageal mucosa distally suggestive of short segment Barrett's esophagus. Not confirmed on biopsy. Gastric biopsy with minimal chronic inflammation  . GIVENS CAPSULE STUDY N/A  04/17/2016   Procedure: GIVENS CAPSULE STUDY;  Surgeon: Daneil Dolin, MD;  Location[facility];  Service: Endoscopy;  Laterality: N/A;  Pt to arrive at 8:00 am for 8:30 am appt  . LEFT HEART CATH AND CORONARY ANGIOGRAPHY N/A 05/11/2018   Procedure: LEFT HEART CATH AND CORONARY ANGIOGRAPHY;  Surgeon: Belva Crome, MD;  Location: [place] [facility] LAB;  Service: Cardiovascular;  Laterality: N/A;  . TEE WITHOUT CARDIOVERSION N/A 04/12/2015   Procedure: TRANSESOPHAGEAL ECHOCARDIOGRAM (TEE);  Surgeon: Larey Dresser, MD;  Location: [facility] ENDOSCOPY;  Service: Cardiovascular;  Laterality: N/A;    Current Outpatient Medications  Medication Sig Dispense Refill  . Abatacept (ORENCIA CLICKJECT) 540 MG/ML SOAJ Inject 125 mg into the skin once a week. (Patient taking differently: Inject 125 mg into the skin once a week. Every Friday) 12 Syringe 0  . amLODipine (NORVASC) 5 MG tablet Take 1 tablet (5 mg total) by mouth daily. 90 tablet 3  . apixaban (ELIQUIS) 5 MG TABS tablet Take 1 tablet (5 mg total) by mouth 2 (two) times daily. 180 tablet 1  . atorvastatin (LIPITOR) 80 MG tablet TAKE 1 TABLET (80 MG TOTAL) BY MOUTH DAILY AT 6 PM. 90 tablet 1  . azaTHIOprine (IMURAN) 50 MG tablet TAKE 1 TABLET BY MOUTH TWICE A DAY 60 tablet 2  . Clobetasol Prop Emollient Base 0.05 % emollient cream APPLY TO AFFECTED AREA TWICE A DAY 30 g 2  . clopidogrel (PLAVIX) 75 MG tablet TAKE 1 TABLET BY MOUTH EVERY DAY 90 tablet 1  . furosemide (LASIX) 40 MG tablet Take 1 tablet (40 mg total) by mouth 2 (two) times daily. 180 tablet 1  . losartan (COZAAR) 100 MG tablet Take 1 tablet (100 mg total) by mouth daily. 90 tablet 1  . pantoprazole (PROTONIX) 40 MG tablet Take 1 tablet (40 mg total) by mouth 2 (two) times daily before a meal. 180 tablet 2  . potassium chloride SA (K-DUR) 20 MEQ tablet Take 2 tablets (40 mEq total) by mouth 2 (two) times daily. 360 tablet 3  . predniSONE (DELTASONE) 5 MG tablet Take 1.5 tablets by mouth daily  with breakfast. 45 tablet 0  . sotalol (BETAPACE) 120 MG tablet Take 1 tablet (120 mg total) by mouth every 12 (twelve) hours. 180 tablet 3   No current facility-administered medications for this encounter.     No Known Allergies  Social History   Socioeconomic History  . Marital status: Married    Spouse name: Not on file  . Number of children: 2  . Years of education: Not on file  . Highest education level: Not on file  Occupational History  . Occupation: unemployed    Comment: not working do to arthritis; used to be Patent attorney for a [employer]  . Financial resource strain: Not on file  . Food insecurity    Worry: Not on file    Inability: Not on file  . Transportation needs    Medical: Not on file    Non-medical: Not on file  Tobacco Use  . Smoking status: Former Smoker    Packs/day: 1.00    Years: 30.00  Pack years: 30.00    Types: Cigarettes    Quit date: 03/21/2003    Years since quitting: 15.5  . Smokeless tobacco: Never Used  Substance and Sexual Activity  . Alcohol use: No    Alcohol/week: 0.0 standard drinks    Comment: H/O case of beer weekly x 20 years, quiting in 2000-ish.  . Drug use: No    Comment: H/O marijuana use many years ago.  [name] Sexual activity: Yes    Birth control/protection: None  Lifestyle  . Physical activity    Days per week: Not on file    Minutes per session: Not on file  . Stress: Not on file  Relationships  . Social Herbalist on phone: Not on file    Gets together: Not on file    Attends religious service: Not on file    Active member of club or organization: Not on file    Attends meetings of clubs or organizations: Not on file    Relationship status: Not on file  . Intimate partner violence    Fear of current or ex partner: Not on file    Emotionally abused: Not on file    Physically abused: Not on file    Forced sexual activity: Not on file  Other Topics Concern  . Not on file   Social History Narrative  . Not on file    Family History  Problem Relation Age of Onset  . Hypertension Mother   . Colon cancer Neg Hx     ROS- All systems are reviewed and negative except as per the HPI above  Physical Exam: Vitals:   09/30/18 1128  BP: (!) 146/64  Pulse: (!) 42  Weight: 90.3 kg  Height: 5\' 7"  (1.702 m)   Wt Readings from Last 3 Encounters:  09/30/18 90.3 kg  08/25/18 89.8 kg  07/29/18 89.4 kg    Labs: Lab Results  Component Value Date   NA 142 08/18/2018   K 4.4 08/18/2018   CL 105 08/18/2018   CO2 26 08/18/2018   GLUCOSE 100 (H) 08/18/2018   BUN 12 08/18/2018   CREATININE 0.79 08/18/2018   CALCIUM 9.3 08/18/2018   PHOS 3.2 04/07/2015   MG 2.0 05/18/2018   Lab Results  Component Value Date   INR 1.2 05/09/2018   Lab Results  Component Value Date   CHOL 130 05/09/2018   HDL 51 05/09/2018   LDLCALC 71 05/09/2018   TRIG 40 05/09/2018     GEN- The patient is well appearing, alert and oriented x 3 today.   Head- normocephalic, atraumatic Eyes-  Sclera clear, conjunctiva pink Ears- hearing intact Oropharynx- clear Neck- supple, no JVP Lymph- no cervical lymphadenopathy Lungs- Clear to ausculation bilaterally, normal work of breathing Heart- slow regular rate and rhythm, no murmurs, rubs or gallops, PMI not laterally displaced GI- soft, NT, ND, + BS Extremities- no clubbing, cyanosis, or edema MS- no significant deformity or atrophy Skin- no rash or lesion Psych- euthymic mood, full affect Neuro- strength and sensation are intact  EKG-Marked sinus brady at 42 bpm, PR int 186 ms, qrs int 152 ms, qtc 422 ms LAD, LBBB( stable form last EKG) Epic reecords reviewed Echo-IMPRESSIONS    1. The left ventricle has mild-moderately reduced systolic function, with an ejection fraction of 40-45%. The cavity size was mildly dilated. There is mildly increased left ventricular wall thickness. There is severe hypokinesis of the basal   inferolateral myocardium. Grade 2 diastolic dysfunction.  2. The right ventricle has normal systolic function. The cavity was mildly enlarged. There is no increase in right ventricular wall thickness. Right ventricular systolic pressure is mildly elevated with an estimated pressure of 37.1 mmHg (decreased  compared to the prior study).  3. Left atrial size was moderately dilated.  4. The mitral valve is normal in structure.  5. The tricuspid valve is normal in structure.  6. The aortic valve is tricuspid.  7. The aortic root is normal in size and structure.  FINDINGS  Left Ventricle: The left ventricle has mild-moderately reduced systolic function, with an ejection fraction of 40-45%. The cavity size was mildly dilated. There is mildly increased left ventricular wall thickness. Left ventricular diastolic Doppler  parameters are consistent with pseudonormalization Right Ventricle: The right ventricle has normal systolic function. The cavity was mildly enlarged. There is no increase in right ventricular wall thickness. Right ventricular systolic pressure is mildly elevated with an estimated pressure of 37.1 mmHg. Left Atrium: left atrial size was moderately dilated Right Atrium: right atrial size was mild-moderately dilated. Right atrial pressure is estimated at 3 mmHg. Interatrial Septum: No atrial level shunt detected by color flow Doppler. Pericardium: There is no evidence of pericardial effusion. Mitral Valve: The mitral valve is normal in structure. Mitral valve regurgitation is trivial by color flow Doppler. Tricuspid Valve: The tricuspid valve is normal in structure. Tricuspid valve regurgitation is trivial by color flow Doppler. Aortic Valve: The aortic valve is tricuspid Aortic valve regurgitation was not visualized by color flow Doppler. Pulmonic Valve: The pulmonic valve was grossly normal. Pulmonic valve regurgitation is not visualized by color flow Doppler. Aorta: The aortic root is  normal in size and structure. Venous: The inferior vena cava is normal in size with greater than 50% respiratory variability.     Assessment and Plan: 1. Afib with RVR Now staying in rhythm with sotalol Continue on sotalol 120 mg bid Asymptomatic brady, tolerating well, stable On eliquis 5 mg bid for CHA2DS2VASc score of at least 4 Bemt/mag drawn   2. CAD No anginal symptoms Per Dr. [name] No ASA for plavix/eliquis on board No BB for brady  3. HTN Stable   F/u with Dr. [name] 11/2018 afib clinic January 2021    Butch Penny C. Tito Ausmus, [facility] [address] [place], [place] [address] [phone]

## 2018-10-06 ENCOUNTER — Telehealth: Payer: Self-pay

## 2018-10-06 NOTE — Telephone Encounter (Signed)
Eliquis pt assistance form faxed 8/4

## 2018-10-08 ENCOUNTER — Other Ambulatory Visit: Payer: Self-pay

## 2018-10-08 NOTE — Telephone Encounter (Signed)
Received Rx request. Losartan 100 mg is on backorder. Pharmacy is requesting Losartan 50 mg 2 tablets QD. Please advise.

## 2018-10-09 ENCOUNTER — Other Ambulatory Visit: Payer: Self-pay

## 2018-10-09 ENCOUNTER — Other Ambulatory Visit: Payer: Self-pay | Admitting: Pharmacist Clinician (PhC)/ Clinical Pharmacy Specialist

## 2018-10-09 MED ORDER — LOSARTAN POTASSIUM 50 MG PO TABS: 100.0000 mg | ORAL_TABLET | Freq: Every day | ORAL | 3 refills | Status: DC

## 2018-10-09 NOTE — Telephone Encounter (Signed)
27M 89.8 kg SCr 0.78, LOV JB 07/2018

## 2018-10-13 ENCOUNTER — Telehealth: Payer: Self-pay

## 2018-10-13 NOTE — Telephone Encounter (Signed)
Patient Assistance for Eliquis re-faxed to [place] at [phone] and [phone] on 10/13/2018.

## 2018-10-26 ENCOUNTER — Other Ambulatory Visit: Payer: Self-pay | Admitting: Rheumatology

## 2018-10-26 DIAGNOSIS — D508 Other iron deficiency anemias: Secondary | ICD-10-CM

## 2018-10-26 NOTE — Telephone Encounter (Signed)
Last visit: 07/28/18 Next visit: 12/01/18 Labs: 08/18/18 Glucose 100, WBC 17.0, Neuto Abs 1.1 Lymph Abs 4.5  Okay to refill per Dr. Estanislado Pandy

## 2018-11-06 NOTE — Progress Notes (Signed)
Office Visit Note  Patient: Curtis Flores             Date of Birth: 17-Mar-1955           MRN: [id number]             PCP: Dione Housekeeper, MD Referring: Dione Housekeeper, MD Visit Date: 11/20/2018 Occupation: @GUAROCC @  Subjective:  Medication monitoring     History of Present Illness: Curtis Flores is a 63 y.o. malewith history of psoriatic arthritis, seropositive rheumatoid arthritis, and pulmonary fibrosis.  Patient is on Orencia 125 mg subcutaneous injections every 7 days, Imuran 50 mg 2 tablets by mouth daily, and prednisone 7.5 mg by mouth daily. He has been taking prednisone since January 2020, which was prescribed by Dr. Elsworth Soho. He denies any recent rheumatoid or psoriatic arthritis flares.  He denies any joint pain or joint swelling at this time.  He denies any Achilles tendinitis or plantar fasciitis.  He denies any SI joint pain.  He denies any morning stiffness.  He continues to have scalp psoriasis and uses topical clobetasol cream as needed.  He does not feel as though clobetasol is not effective.  He continues to have lung crackles but denies any increase shortness of breath or coughing.  He states that he needs to schedule follow-up appointment with his pulmonologist for routine visit.   Activities of Daily Living:  Patient reports morning stiffness for 0  minutes.   Patient Denies nocturnal pain.  Difficulty dressing/grooming: Denies Difficulty climbing stairs: Denies Difficulty getting out of chair: Denies Difficulty using hands for taps, buttons, cutlery, and/or writing: Denies  Review of Systems  Constitutional: Positive for fatigue. Negative for night sweats.  HENT: Positive for mouth dryness. Negative for mouth sores and nose dryness.   Eyes: Negative for redness and dryness.  Respiratory: Negative for cough, hemoptysis, shortness of breath and difficulty breathing.   Cardiovascular: Negative for chest pain, palpitations, hypertension, irregular heartbeat and  swelling in legs/feet.  Gastrointestinal: Negative for blood in stool, constipation and diarrhea.  Endocrine: Negative for increased urination.  Genitourinary: Negative for painful urination.  Musculoskeletal: Negative for arthralgias, joint pain, joint swelling, myalgias, muscle weakness, morning stiffness, muscle tenderness and myalgias.  Skin: Positive for rash (Psoriasis on scalp). Negative for color change, hair loss, nodules/bumps, skin tightness, ulcers and sensitivity to sunlight.  Allergic/Immunologic: Negative for susceptible to infections.  Neurological: Negative for dizziness, fainting, memory loss, night sweats and weakness.  Hematological: Negative for swollen glands.  Psychiatric/Behavioral: Negative for depressed mood and sleep disturbance. The patient is not nervous/anxious.     PMFS History:  Patient Active Problem List   Diagnosis Date Noted  . Atrial fibrillation with RVR ([place]) 05/28/2018  . Chronic anticoagulation 05/28/2018  . NSTEMI (non-ST elevated myocardial infarction) ([place]) 05/09/2018  . Lobar pneumonia, unspecified organism ([place]) 01/31/2018  . Sepsis ([place]) 01/31/2018  . Acute respiratory failure with hypoxia ([place]) 01/31/2018  . Ischemic cardiomyopathy 12/04/2016  . Leukocytosis 07/17/2016  . Pain in joint involving multiple sites 04/25/2016  . Psoriatic arthritis ([place]) 02/14/2016  . High risk medication use 02/14/2016  . NICM (nonischemic cardiomyopathy) ([place]) 08/29/2015  . Essential hypertension 08/29/2015  . Abnormal CT scan, stomach   . Hiatal hernia   . GERD (gastroesophageal reflux disease) 08/01/2015  . Weight loss, unintentional 08/01/2015  . Iron deficiency anemia 06/06/2015  . Heme positive stool 06/06/2015  . PAF (paroxysmal atrial fibrillation) ([place]) 05/30/2015  . CAD S/P percutaneous coronary angioplasty 04/07/2015  .  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 Acute on chronic systolic congestive heart failure ([place]) 04/07/2015  .  LBBB (left bundle branch block) 04/07/2015  . Wide-complex tachycardia ([place]) 04/07/2015  . Dyslipidemia, goal LDL below 70 03/07/2015  . Psoriasis   . Rheumatoid arthritis ([place])   . Pulmonary fibrosis ([place]) 03/21/2011    Past Medical History:  Diagnosis Date  . Abnormal CT scan, stomach   . Chronic systolic dysfunction of left ventricle   . Coronary artery disease    lateral STEMI 02/22/15 3/10 cutting balloon to ISR of the o/pLCX  . GERD (gastroesophageal reflux disease)   . Hyperlipidemia   . Hypertension   . Ischemic cardiomyopathy   . LBBB (left bundle branch block)   . Leukocytosis    followed by hematology, reactive  . Lymphadenopathy   . Myocardial infarction ([place]) 02/2015  . Psoriasis 2003  . Psoriatic arthritis ([place])   . Pulmonary fibrosis ([place])   . Rheumatoid arthritis(714.0) 2012  . Typical atrial flutter ([facility])     Family History  Problem Relation Age of Onset  . Hypertension Mother   . Colon cancer Neg Hx    Past Surgical History:  Procedure Laterality Date  . CARDIAC CATHETERIZATION N/A 02/22/2015   Procedure: Left Heart Cath and Coronary Angiography;  Surgeon: Lorretta Harp, MD;  Location: [place] [facility];  Service: Cardiovascular;  Laterality: N/A;  . CARDIAC CATHETERIZATION N/A 02/22/2015   Procedure: Coronary Stent Intervention;  Surgeon: Lorretta Harp, MD;  Location: [place] [facility];  Service: Cardiovascular;  Laterality: N/A;  . CARDIOVERSION N/A 04/12/2015   Procedure: CARDIOVERSION;  Surgeon: Larey Dresser, MD;  Location: [facility] ENDOSCOPY;  Service: Cardiovascular;  Laterality: N/A;  . COLONOSCOPY  07/01/2003   [id number] colonic mucosa except for the proximal right colon in the area of ICV which was not seen completely due to inadequate bowel prep. followed with ACBE which was normal.   . COLONOSCOPY N/A 08/24/2015   Dr. Gala Romney: Normal colon. Next colonoscopy in 10 years.  . CORONARY BALLOON ANGIOPLASTY N/A 05/12/2018   Procedure: CORONARY  BALLOON ANGIOPLASTY;  Surgeon: Belva Crome, MD;  Location: [place] [facility];  Service: Cardiovascular;  Laterality: N/A;  . ELECTROPHYSIOLOGIC STUDY N/A 05/30/2015   Atrial fibrillation ablation by Dr Rayann Heman  . ESOPHAGOGASTRODUODENOSCOPY N/A 08/24/2015   Dr. Gala Romney: Medium-sized hiatal hernia, erosive gastropathy. Cameron lesions. Esophageal mucosa distally suggestive of short segment Barrett's esophagus. Not confirmed on biopsy. Gastric biopsy with minimal chronic inflammation  . GIVENS CAPSULE STUDY N/A 04/17/2016   Procedure: GIVENS CAPSULE STUDY;  Surgeon: Daneil Dolin, MD;  [facility];  Service: Endoscopy;  Laterality: N/A;  Pt to arrive at 8:00 am for 8:30 am appt  . LEFT HEART CATH AND CORONARY ANGIOGRAPHY N/A 05/11/2018   Procedure: LEFT HEART CATH AND CORONARY ANGIOGRAPHY;  Surgeon: Belva Crome, MD;  Location: [place] [facility];  Service: Cardiovascular;  Laterality: N/A;  . TEE WITHOUT CARDIOVERSION N/A 04/12/2015   Procedure: TRANSESOPHAGEAL ECHOCARDIOGRAM (TEE);  Surgeon: Larey Dresser, MD;  Location[facility];  Service: Cardiovascular;  Laterality: N/A;   Social History   Social History Narrative  . Not on file   Immunization History  Administered Date(s) Administered  . Influenza Split 12/22/2010  . Influenza Whole 01/18/2010  . Influenza, High Dose Seasonal PF 11/11/2017  . Influenza, Seasonal, Injecte, Preservative Fre 12/03/2014, 12/13/2015, 12/03/2017  . Influenza-Unspecified 12/03/2014, 12/13/2015  . Pneumococcal Conjugate-13 08/20/2016, 04/07/2018  .  Pneumococcal Polysaccharide-23 01/22/2011, 03/05/2011  . Pneumococcal-Unspecified 01/22/2011     Objective: Vital Signs: BP (!) 117/58 (BP Location: Left Arm, Patient Position: Sitting, Cuff Size: Normal)   Pulse 64   Resp 15   Ht 5\' 7"  (1.702 m)   Wt 199 lb (90.3 kg)   BMI 31.17 kg/m    Physical Exam Vitals signs and nursing note reviewed.  Constitutional:      Appearance: He is  well-developed.  HENT:     Head: Normocephalic and atraumatic.  Eyes:     Conjunctiva/sclera: Conjunctivae normal.     Pupils: Pupils are equal, round, and reactive to light.  Neck:     Musculoskeletal: Normal range of motion and neck supple.  Cardiovascular:     Rate and Rhythm: Normal rate and regular rhythm.     Heart sounds: Normal heart sounds.  Pulmonary:     Effort: Pulmonary effort is normal.     Breath sounds: Normal breath sounds.  Abdominal:     General: Bowel sounds are normal.     Palpations: Abdomen is soft.  Skin:    General: Skin is warm and dry.     Capillary Refill: Capillary refill takes less than 2 seconds.  Neurological:     Mental Status: He is alert and oriented to person, place, and time.  Psychiatric:        Behavior: Behavior normal.      Musculoskeletal Exam: C-spine, thoracic spine, lumbar spine good range of motion.  No midline spinal tenderness.  No SI joint tenderness.  Shoulder joints, elbows, wrist joints, MCPs and PIPs and DIPs good range of motion no synovitis.  He has synovial thickening over bilateral wrist joints.  He has synovial thickening of the right first, third, fourth, and fifth MCP joints and the left second, third, and third MCP joints.  He has PIP and DIP synovial thickening.  He has mild flexion contractures of the DIP joints.  Hip joints, knee joints, ankle joints, MTPs, PIPs, DIPs good range of motion no synovitis.  No warmth or effusion of bilateral knee joints.  No tenderness or swelling of ankle joints.  No Achilles tendinitis or plantar fasciitis.  CDAI Exam: CDAI Score: 0.4  Patient Global: 2 mm; Provider Global: 2 mm Swollen: 0 ; Tender: 0  Joint Exam   No joint exam has been documented for this visit   There is currently no information documented on the homunculus. Go to the Rheumatology activity and complete the homunculus joint exam.  Investigation: No additional findings.  Imaging: No results found.  Recent  Labs: Lab Results  Component Value Date   WBC 17.0 (H) 08/18/2018   HGB 15.2 08/18/2018   PLT 250 08/18/2018   NA 142 09/30/2018   K 4.6 09/30/2018   CL 107 09/30/2018   CO2 30 09/30/2018   GLUCOSE 110 (H) 09/30/2018   BUN 11 09/30/2018   CREATININE 0.78 09/30/2018   BILITOT 0.5 08/18/2018   ALKPHOS 63 08/18/2018   AST 30 08/18/2018   ALT 31 08/18/2018   PROT 6.9 08/18/2018   ALBUMIN 3.8 08/18/2018   CALCIUM 9.6 09/30/2018   GFRAA >60 09/30/2018   QFTBGOLDPLUS NEGATIVE 01/21/2018    Speciality Comments: No specialty comments available.  Procedures:  No procedures performed Allergies: Patient has no known allergies.   Assessment / Plan:     Visit Diagnoses: Psoriatic arthritis ([place]): He has no synovitis or dactylitis on exam.  He has not had any recent psoriatic arthritis flares.  He has no joint pain or joint swelling at this time.  He has no Achilles tendinitis or plantar fasciitis.  No SI joint tenderness was noted on exam.  He is clinically doing well on combination therapy of Orencia 125 mg subcutaneous injections every week, Imuran 100 mg by mouth daily, and prednisone 7.5 mg daily.  He continues to have psoriasis on his scalp and uses clobetasol cream topically.  No nail changes were noted.  He requested a refill of prednisone today.  He will start tapering prednisone by alternating 7.5 and 5 mg every other day for 1 week and if he does not develop any new or worsening symptoms he will continue on 5 mg by mouth daily.  He was advised to notify us if he develops increased joint pain or joint swelling.  He will follow-up in the office in 5 months.  Psoriasis: He has psoriasis on his scalp.  He applies clobetasol cream topically.  Rheumatoid arthritis involving multiple sites with positive rheumatoid factor [facility]): He has synovial thickening of several MCP joints as described above.  No active synovitis was noted.  He has not had any recent rheumatoid arthritis flares.  He has no  joint pain or joint swelling at this time.  He has no morning stiffness.  He is clinically doing well on Orencia 125 mg subcutaneous injections once weekly, Imuran 100 mg by mouth daily, prednisone 7.5 mg by mouth daily.  Prednisone taper discussed and noted above.  He has not missed any doses recently.  He request a refill of prednisone.  He will continue on his current treatment regimen.  He was advised to notify us if he develops increased joint pain or joint swelling.  High risk medication use - Orencia 125 mg sq weekly injections (10/11/2016), imuran 100 mg po qd, prednisone 7.5 mg po daily ( Inadequate response to Enbrel, Humira, Cosyntex, Otezla ). Last TB gold negative on 01/21/2018 and will monitor yearly. Future TB gold order placed.  Most recent CBC/CMP within normal limits except for elevated hemoglobin on 08/18/2018.  Due for CBC/MP today and will monitor every 3 months.  - Plan: QuantiFERON-TB Gold Plus, CBC with Differential/Platelet, COMPLETE METABOLIC PANEL WITH GFR  Pulmonary fibrosis ([place]) -He has not had any new or worsening pulmonary symptoms.  He had a routine CXR and PFTs on 03/20/2018.  Chest x-ray findings were stable since December 2019.  According to Dr. Philbert Riser note his lung function had dropped by 15% due to a recent diagnosis of pneumonia.  He was started on a prednisone taper.  He is currently on 7.5 mg of prednisone daily and Imuran 100 mg by mouth daily.  He was advised to schedule a routine follow up visit with Dr. Elsworth Soho.   Osteopenia of multiple sites: He declines therapy at this time.  Last DEXA on 10/18/019 showed T score of -1.6 at left femur neck  -5% change in BMD.  He takes calcium and vitamin D supplements.   Other medical conditions are listed as follows:   History of gastroesophageal reflux (GERD)  History of bronchiectasis  History of congestive heart failure  History of hyperlipidemia  History of coronary artery disease  History of hypertension   History of atrial fibrillation  Orders: Orders Placed This Encounter  Procedures  . QuantiFERON-TB Gold Plus  . CBC with Differential/Platelet  . COMPLETE METABOLIC PANEL WITH GFR   Meds ordered this encounter  Medications  . predniSONE (DELTASONE) 5 MG tablet    Sig: Take  1.5 tablets by mouth daily with breakfast.    Dispense:  45 tablet    Refill:  0    Follow-Up Instructions: Return in about 5 months (around 04/22/2019) for Psoriatic arthritis, Rheumatoid arthritis.   Ofilia Neas, PA-C  I examined and evaluated the patient with Hazel Sams PA.  Patient is clinically doing well.  I have advised him to stay on the current dose of prednisone until he sees Dr. Elsworth Soho.  The plan of care was discussed as noted above.  Bo Merino, MD  Note - This record has been created using Editor, commissioning.  Chart creation errors have been sought, but may not always  have been located. Such creation errors do not reflect on  the standard of medical care.

## 2018-11-10 ENCOUNTER — Other Ambulatory Visit: Payer: Self-pay

## 2018-11-10 ENCOUNTER — Telehealth: Payer: Self-pay | Admitting: Internal Medicine

## 2018-11-10 MED ORDER — PANTOPRAZOLE SODIUM 40 MG PO TBEC: 40.0000 mg | DELAYED_RELEASE_TABLET | Freq: Two times a day (BID) | ORAL | 0 refills | Status: DC

## 2018-11-10 NOTE — Telephone Encounter (Signed)
[id number]  PATIENT WIFE CALLED ASKING ABOUT HIS REFLUX PRESCRIPTION, THE PHARMACY WAS SENDING [place] SOMETHING TO GET IT REFILLED, PATIENT NEEDS TO KNOW WHAT TO DO UNTIL THEN BECAUSE HIS REFLUX IS BAD

## 2018-11-10 NOTE — Addendum Note (Signed)
Addended by: Mahala Menghini on: 11/10/2018 01:37 PM   Modules accepted: Orders

## 2018-11-10 NOTE — Telephone Encounter (Signed)
Spoke with pt. He is aware that for additional refills, he will need an office visit.

## 2018-11-10 NOTE — Telephone Encounter (Signed)
Pt would like a refill of his medication sent to [place].   Pt also asked if his wife could be added to his HIPPA form. Pt will need update forms.

## 2018-11-10 NOTE — Telephone Encounter (Signed)
Can I verify, are we talking about his pantoprazole RX? We have not seen him since 01/2017.  He needs OV for refills. I sent in one refill for pantoprazole.

## 2018-11-16 ENCOUNTER — Other Ambulatory Visit: Payer: Self-pay | Admitting: Rheumatology

## 2018-11-17 NOTE — Telephone Encounter (Signed)
Last visit: 07/28/18 Next visit: 12/01/18 Labs: 08/18/18 Glucose 100, WBC 17.0, Neuto Abs 1.1 Lymph Abs 4.5 Tb Gold: 01/21/18 Neg   Okay to refill per Dr. Estanislado Pandy

## 2018-11-20 ENCOUNTER — Other Ambulatory Visit: Payer: Self-pay

## 2018-11-20 ENCOUNTER — Ambulatory Visit (INDEPENDENT_AMBULATORY_CARE_PROVIDER_SITE_OTHER): Payer: Medicare HMO | Admitting: Physician Assistant

## 2018-11-20 ENCOUNTER — Encounter: Payer: Self-pay | Admitting: Physician Assistant

## 2018-11-20 VITALS — BP 117/58 | HR 64 | Resp 15 | Ht 67.0 in | Wt 199.0 lb

## 2018-11-20 DIAGNOSIS — M0579 Rheumatoid arthritis with rheumatoid factor of multiple sites without organ or systems involvement: Secondary | ICD-10-CM

## 2018-11-20 DIAGNOSIS — Z79899 Other long term (current) drug therapy: Secondary | ICD-10-CM | POA: Diagnosis not present

## 2018-11-20 DIAGNOSIS — Z8719 Personal history of other diseases of the digestive system: Secondary | ICD-10-CM

## 2018-11-20 DIAGNOSIS — Z8639 Personal history of other endocrine, nutritional and metabolic disease: Secondary | ICD-10-CM

## 2018-11-20 DIAGNOSIS — L4050 Arthropathic psoriasis, unspecified: Secondary | ICD-10-CM | POA: Diagnosis not present

## 2018-11-20 DIAGNOSIS — M8589 Other specified disorders of bone density and structure, multiple sites: Secondary | ICD-10-CM

## 2018-11-20 DIAGNOSIS — L409 Psoriasis, unspecified: Secondary | ICD-10-CM

## 2018-11-20 DIAGNOSIS — J8410 Pulmonary fibrosis, unspecified: Secondary | ICD-10-CM

## 2018-11-20 DIAGNOSIS — Z8679 Personal history of other diseases of the circulatory system: Secondary | ICD-10-CM

## 2018-11-20 DIAGNOSIS — Z8709 Personal history of other diseases of the respiratory system: Secondary | ICD-10-CM

## 2018-11-20 MED ORDER — PREDNISONE 5 MG PO TABS: ORAL_TABLET | ORAL | 0 refills | Status: DC

## 2018-11-21 LAB — CBC WITH DIFFERENTIAL/PLATELET
Absolute Monocytes: 774 cells/uL (ref 200–950)
Basophils Absolute: 88 cells/uL (ref 0–200)
Basophils Relative: 0.6 %
Eosinophils Absolute: 117 cells/uL (ref 15–500)
Eosinophils Relative: 0.8 %
HCT: 46.9 % (ref 38.5–50.0)
Hemoglobin: 15.4 g/dL (ref 13.2–17.1)
Lymphs Abs: 3022 cells/uL (ref 850–3900)
MCH: 28.6 pg (ref 27.0–33.0)
MCHC: 32.8 g/dL (ref 32.0–36.0)
MCV: 87.2 fL (ref 80.0–100.0)
MPV: 12 fL (ref 7.5–12.5)
Monocytes Relative: 5.3 %
Neutro Abs: 10600 cells/uL — ABNORMAL HIGH (ref 1500–7800)
Neutrophils Relative %: 72.6 %
Platelets: 259 10*3/uL (ref 140–400)
RBC: 5.38 10*6/uL (ref 4.20–5.80)
RDW: 12.5 % (ref 11.0–15.0)
Total Lymphocyte: 20.7 %
WBC: 14.6 10*3/uL — ABNORMAL HIGH (ref 3.8–10.8)

## 2018-11-21 LAB — COMPLETE METABOLIC PANEL WITH GFR
AG Ratio: 1.4 (calc) (ref 1.0–2.5)
ALT: 28 U/L (ref 9–46)
AST: 28 U/L (ref 10–35)
Albumin: 3.9 g/dL (ref 3.6–5.1)
Alkaline phosphatase (APISO): 59 U/L (ref 35–144)
BUN: 11 mg/dL (ref 7–25)
CO2: 28 mmol/L (ref 20–32)
Calcium: 9.7 mg/dL (ref 8.6–10.3)
Chloride: 104 mmol/L (ref 98–110)
Creat: 0.84 mg/dL (ref 0.70–1.25)
GFR, Est African American: 106 mL/min/{1.73_m2} (ref >=60)
GFR, Est Non African American: 92 mL/min/{1.73_m2} (ref >=60)
Globulin: 2.7 g/dL (calc) (ref 1.9–3.7)
Glucose, Bld: 93 mg/dL (ref 65–99)
Potassium: 5 mmol/L (ref 3.5–5.3)
Sodium: 143 mmol/L (ref 135–146)
Total Bilirubin: 0.4 mg/dL (ref 0.2–1.2)
Total Protein: 6.6 g/dL (ref 6.1–8.1)

## 2018-11-23 NOTE — Progress Notes (Signed)
WBC count is borderline elevated.  He is on long term prednisone and has not had any recent infections.  CMP WNL

## 2018-11-26 ENCOUNTER — Other Ambulatory Visit: Payer: Self-pay | Admitting: Cardiovascular Disease

## 2018-12-01 ENCOUNTER — Ambulatory Visit: Payer: Medicare HMO | Admitting: Physician Assistant

## 2018-12-02 ENCOUNTER — Telehealth: Payer: Self-pay

## 2018-12-02 NOTE — Telephone Encounter (Signed)
Left message for patient to call back  

## 2018-12-02 NOTE — Telephone Encounter (Signed)
-----   Message from Rigoberto Noel, MD sent at 11/25/2018  3:43 PM EDT ----- Pl arrange for FU visit with APP / me  RA ----- Message ----- From: Bo Merino, MD Sent: 11/20/2018   1:52 PM EDT To: Rigoberto Noel, MD

## 2018-12-09 ENCOUNTER — Telehealth: Payer: Self-pay | Admitting: Cardiovascular Disease

## 2018-12-09 MED ORDER — APIXABAN 5 MG PO TABS: 5.0000 mg | ORAL_TABLET | Freq: Two times a day (BID) | ORAL | 1 refills | Status: DC

## 2018-12-09 NOTE — Telephone Encounter (Signed)
Spoke with wife about Eliquis cost. A 30 day supply is $123 at [facility] and 90 day supply is $370 at [facility]. Explained they can check with insurance to see if Xarelto or Pradaxa are on lower tiers. Explained generic alternative of warfarin requires blood monitoring, etc.   Offered to check on samples but patient lives in [place] Patient's wife states they don't qualify for patient assistance but explained that if they applied early in the year it may be different than if applied now  Routed to CVRR/MD to advice on anticoagulation options

## 2018-12-09 NOTE — Telephone Encounter (Signed)
New message   Patient wants to know if there is a substitute for  apixaban (ELIQUIS) 5 MG TABS tablet   The patient's wife states that they can't afford to pay for this medicine. Please call.

## 2018-12-09 NOTE — Telephone Encounter (Signed)
I don't have any additional recommendations at this time. I think you cover all of them.  We will be happy to transition if they decide to change to warfarin.

## 2018-12-10 NOTE — Telephone Encounter (Signed)
If patient cannot afford a DO AC, I am okay to transitioning to warfarin if the patient so desires

## 2018-12-11 NOTE — Telephone Encounter (Signed)
Wife notified of MD advice. She has contacted insurance and formulary alternatives are no cheaper than Eliquis. They will call back if they decide to change DOAC

## 2018-12-14 ENCOUNTER — Other Ambulatory Visit: Payer: Self-pay | Admitting: Rheumatology

## 2018-12-15 NOTE — Telephone Encounter (Addendum)
Last Visit: 11/20/18 Next Visit: 04/23/19 Labs: 11/20/18 WBC count is borderline elevated. CMP WNL Tb Gold: 01/21/18 Neg   Okay to refill per Dr. Estanislado Pandy

## 2018-12-16 ENCOUNTER — Telehealth: Payer: Self-pay | Admitting: *Deleted

## 2018-12-16 NOTE — Telephone Encounter (Signed)
A message was left, re: his follow up visit. 

## 2018-12-25 ENCOUNTER — Other Ambulatory Visit: Payer: Self-pay | Admitting: Rheumatology

## 2018-12-25 DIAGNOSIS — D508 Other iron deficiency anemias: Secondary | ICD-10-CM

## 2018-12-30 ENCOUNTER — Other Ambulatory Visit: Payer: Self-pay | Admitting: Cardiovascular Disease

## 2019-01-11 ENCOUNTER — Telehealth: Payer: Self-pay | Admitting: Pharmacy Technician

## 2019-01-11 NOTE — Telephone Encounter (Signed)
Mailed patient Orencia renewal application from [employer]. Will fax once patient completes and returns.  11:51 AM Beatriz Chancellor, CPhT

## 2019-01-14 ENCOUNTER — Other Ambulatory Visit: Payer: Self-pay | Admitting: Cardiovascular Disease

## 2019-02-04 ENCOUNTER — Other Ambulatory Visit: Payer: Self-pay | Admitting: Cardiology

## 2019-02-04 ENCOUNTER — Other Ambulatory Visit: Payer: Self-pay | Admitting: Gastroenterology

## 2019-02-04 NOTE — Telephone Encounter (Signed)
This is Dr. Berry's pt 

## 2019-02-05 ENCOUNTER — Ambulatory Visit (INDEPENDENT_AMBULATORY_CARE_PROVIDER_SITE_OTHER): Payer: Medicare HMO | Admitting: Cardiovascular Disease

## 2019-02-05 ENCOUNTER — Encounter: Payer: Self-pay | Admitting: Cardiovascular Disease

## 2019-02-05 ENCOUNTER — Other Ambulatory Visit: Payer: Self-pay

## 2019-02-05 DIAGNOSIS — J8410 Pulmonary fibrosis, unspecified: Secondary | ICD-10-CM | POA: Diagnosis not present

## 2019-02-05 DIAGNOSIS — I480 Paroxysmal atrial fibrillation: Secondary | ICD-10-CM | POA: Diagnosis not present

## 2019-02-05 DIAGNOSIS — I428 Other cardiomyopathies: Secondary | ICD-10-CM

## 2019-02-05 DIAGNOSIS — E785 Hyperlipidemia, unspecified: Secondary | ICD-10-CM | POA: Diagnosis not present

## 2019-02-05 DIAGNOSIS — I10 Essential (primary) hypertension: Secondary | ICD-10-CM | POA: Diagnosis not present

## 2019-02-05 MED ORDER — CLOPIDOGREL BISULFATE 75 MG PO TABS: 75.0000 mg | ORAL_TABLET | Freq: Every day | ORAL | 3 refills | Status: DC

## 2019-02-05 MED ORDER — CLOPIDOGREL BISULFATE 75 MG PO TABS: 75.0000 mg | ORAL_TABLET | Freq: Every day | ORAL | 1 refills | Status: DC

## 2019-02-05 NOTE — Progress Notes (Signed)
02/05/2019 Curtis Flores   [birth year]  OF:[id number]  Primary Physician Chesley Noon, MD Primary Cardiologist: Lorretta Harp MD Lupe Carney, [place]  HPI:  Curtis Flores is a 63 y.o. moderately overweight, married African-American male father of 2, grandfather to 3 grandchildren accompanied by his wife Curtis Flores today. I last saw him in the office 12/02/2017.Marland KitchenHis primary care physician is Dr. Edrick Oh in medicine. I recently saw him in the office 03/07/15. He has a remote history of tobacco abuse as well as a history of hypertension and hyperlipidemia rheumatoid arthritis. He had an inferolateral STEMI on 02/22/15. I took him to the Cath Lab and stented his proximal dominant circumflex and first obtuse marginal branch with drug-eluting stents (T stenting). He had residual 90% stenosis and a mid nondominant RCA 80% proximal LAD stenosis. His EF was 35-40% by 2-D echo.Subsequent Myoview stress testing performed soon thereafter was nonischemic.[name] There is a history of pulmonary fibrosis however as well. When I saw him in the office on 04/05/15 he was significantly dyspneic and tachycardic. I initially thought that this was sinus tachycardia however he was admitted to the hospital 2 days later with progressive dyspnea and he was diagnosed with atrial flutter. He did have a wide complex tachycardia prior to that which converted with venous adenosine. He UNDERWENT transesophageal echo/DC cardioversion successfully was has resulted in marked improvement in his symptoms. His EF at that time was 20%. He was put on oral anticoagulations andBrilentawas changed to Plavix.He also underwent a flutter ablation by Dr. Rayann Heman on 05/30/15 and was taken off his amiodarone.   Since I saw him a year ago he was hospitalized 05/09/2018 with non-STEMI.  He underwent cardiac catheterization by Dr. Tamala Julian demonstrating "in-stent restenosis within the proximal circumflex "T stent" and underwent Cutting Balloon atherectomy.  He  did develop a flutter prior to his hospitalization but converted to sinus rhythm and was begun on sotalol by Dr. Rayann Heman.  He continues to maintain sinus rhythm/sinus bradycardia on Eliquis oral anticoagulation.  He does have pulmonary fibrosis followed by Dr. Elsworth Soho.   Current Meds  Medication Sig  . amLODipine (NORVASC) 5 MG tablet TAKE 1 TABLET BY MOUTH EVERY DAY  . apixaban (ELIQUIS) 5 MG TABS tablet Take 1 tablet (5 mg total) by mouth 2 (two) times daily.  [name] atorvastatin (LIPITOR) 80 MG tablet TAKE 1 TABLET (80 MG TOTAL) DAILY AT 6 PM. (OVERDUE FOR OFFICE VISIT PLEASE CALL FOR APPOINTMENT)  . azaTHIOprine (IMURAN) 50 MG tablet TAKE 1 TABLET TWICE DAILY  . Clobetasol Prop Emollient Base 0.05 % emollient cream APPLY TO AFFECTED AREA TWICE A DAY  . clopidogrel (PLAVIX) 75 MG tablet Take 1 tablet (75 mg total) by mouth daily.  . furosemide (LASIX) 40 MG tablet TAKE 1 TABLET (40 MG TOTAL) BY MOUTH 2 (TWO) TIMES DAILY.  [name] losartan (COZAAR) 100 MG tablet   . ORENCIA CLICKJECT 0000000 MG/ML SOAJ INJECT 125MG  (1 SYRINGE) SUBCUTANEOUSLY ONCE A WEEK.  (STORE IN REFRIGERATOR IMMEDIATELY.)  . pantoprazole (PROTONIX) 40 MG tablet Take 1 tablet (40 mg total) by mouth 2 (two) times daily before a meal.  . potassium chloride SA (K-DUR) 20 MEQ tablet Take 2 tablets (40 mEq total) by mouth 2 (two) times daily.  . predniSONE (DELTASONE) 5 MG tablet Take 1.5 tablets by mouth daily with breakfast.  . sotalol (BETAPACE) 120 MG tablet Take 1 tablet (120 mg total) by mouth every 12 (twelve) hours.  . [DISCONTINUED] clopidogrel (PLAVIX) 75 MG  tablet TAKE 1 TABLET BY MOUTH EVERY DAY     No Known Allergies  Social History   Socioeconomic History  . Marital status: Married    Spouse name: Not on file  . Number of children: 2  . Years of education: Not on file  . Highest education level: Not on file  Occupational History  . Occupation: unemployed    Comment: not working do to arthritis; used to be Writer for a [employer]  . Financial resource strain: Not on file  . Food insecurity    Worry: Not on file    Inability: Not on file  . Transportation needs    Medical: Not on file    Non-medical: Not on file  Tobacco Use  . Smoking status: Former Smoker    Packs/day: 1.00    Years: 30.00    Pack years: 30.00    Types: Cigarettes    Quit date: 03/21/2003    Years since quitting: 15.8  . Smokeless tobacco: Never Used  Substance and Sexual Activity  . Alcohol use: No    Alcohol/week: 0.0 standard drinks    Comment: H/O case of beer weekly x 20 years, quiting in 2000-ish.  . Drug use: No    Comment: H/O marijuana use many years ago.  [name] Sexual activity: Yes    Birth control/protection: None  Lifestyle  . Physical activity    Days per week: Not on file    Minutes per session: Not on file  . Stress: Not on file  Relationships  . Social Herbalist on phone: Not on file    Gets together: Not on file    Attends religious service: Not on file    Active member of club or organization: Not on file    Attends meetings of clubs or organizations: Not on file    Relationship status: Not on file  . Intimate partner violence    Fear of current or ex partner: Not on file    Emotionally abused: Not on file    Physically abused: Not on file    Forced sexual activity: Not on file  Other Topics Concern  . Not on file  Social History Narrative  . Not on file     Review of Systems: General: negative for chills, fever, night sweats or weight changes.  Cardiovascular: negative for chest pain, dyspnea on exertion, edema, orthopnea, palpitations, paroxysmal nocturnal dyspnea or shortness of breath Dermatological: negative for rash Respiratory: negative for cough or wheezing Urologic: negative for hematuria Abdominal: negative for nausea, vomiting, diarrhea, bright red blood per rectum, melena, or hematemesis Neurologic: negative for visual changes, syncope,  or dizziness All other systems reviewed and are otherwise negative except as noted above.    Blood pressure (!) 158/63, pulse 90, temperature (!) 97.3 F (36.3 C), height 5\' 7"  (1.702 m), weight 202 lb 3.2 oz (91.7 kg), SpO2 96 %.  General appearance: alert and no distress Neck: no adenopathy, no carotid bruit, no JVD, supple, symmetrical, trachea midline and thyroid not enlarged, symmetric, no tenderness/mass/nodules Lungs: clear to auscultation bilaterally Heart: regular rate and rhythm, S1, S2 normal, no murmur, click, rub or gallop Extremities: extremities normal, atraumatic, no cyanosis or edema Pulses: 2+ and symmetric Skin: Skin color, texture, turgor normal. No rashes or lesions Neurologic: Alert and oriented X 3, normal strength and tone. Normal symmetric reflexes. Normal coordination and gait  EKG not performed today  ASSESSMENT AND PLAN:  Pulmonary fibrosis (HCC) History of pulmonary fibrosis followed by Dr Elsworth Soho   Dyslipidemia, goal LDL below 70 History of hyperlipidemia on statin therapy with lipid profile performed 05/09/2018 revealing total cholesterol 130, LDL 71 and HDL of 51.  CAD S/P percutaneous coronary angioplasty History of CAD status post inferolateral STEMI 02/22/2015.  I took him to the Cath Lab demonstrating a proximal dominant circumflex which I stented using a "T stent technique.  He did have residual 90% stenosis in a nondominant RCA and 80% proximal LAD stenosis.  His EF at that time was 35 to 40%.  Subsequent Myoview was nonischemic.  He was hospitalized 05/09/2018 for non-STEMI and underwent cardiac catheterization by Dr. Tamala Julian revealing "in-stent restenosis within the proximal circumflex T stent.  He underwent Cutting Balloon atherectomy with an excellent result although he did have residual stenosis within the obtuse marginal branch stent.  He has had no recurrent symptoms.  He is on Plavix.  PAF (paroxysmal atrial fibrillation) (HCC) History of PAF status  post A. fib ablation by Dr. Rayann Heman 05/30/2015 at which time he was taken off amiodarone.  He underwent cardioversion during his recent hospitalization.  He remains in sinus rhythm/sinus bradycardia on Eliquis oral anticoagulation and sotalol.  Essential hypertension History of essential hypertension with blood pressure measured today at 158/63.  He is on amlodipine and losartan.  NICM (nonischemic cardiomyopathy) ([place]) History of cardiomyopathy with an EF in the 40 to 45% by 2D echo performed in March of this year during his hospitalization for non-STEMI.      Lorretta Harp MD FACP,FACC,FAHA, [facility] 02/05/2019 11:54 AM

## 2019-02-05 NOTE — Assessment & Plan Note (Signed)
History of PAF status post A. fib ablation by Dr. Rayann Heman 05/30/2015 at which time he was taken off amiodarone.  He underwent cardioversion during his recent hospitalization.  He remains in sinus rhythm/sinus bradycardia on Eliquis oral anticoagulation and sotalol.

## 2019-02-05 NOTE — Assessment & Plan Note (Signed)
History of essential hypertension with blood pressure measured today at 158/63.  He is on amlodipine and losartan.

## 2019-02-05 NOTE — Assessment & Plan Note (Signed)
History of pulmonary fibrosis followed by Dr Elsworth Soho

## 2019-02-05 NOTE — Assessment & Plan Note (Signed)
History of CAD status post inferolateral STEMI 02/22/2015.  I took him to the Cath Lab demonstrating a proximal dominant circumflex which I stented using a "T stent technique.  He did have residual 90% stenosis in a nondominant RCA and 80% proximal LAD stenosis.  His EF at that time was 35 to 40%.  Subsequent Myoview was nonischemic.  He was hospitalized 05/09/2018 for non-STEMI and underwent cardiac catheterization by Dr. Tamala Julian revealing "in-stent restenosis within the proximal circumflex T stent.  He underwent Cutting Balloon atherectomy with an excellent result although he did have residual stenosis within the obtuse marginal branch stent.  He has had no recurrent symptoms.  He is on Plavix.

## 2019-02-05 NOTE — Assessment & Plan Note (Signed)
History of cardiomyopathy with an EF in the 40 to 45% by 2D echo performed in March of this year during his hospitalization for non-STEMI.

## 2019-02-05 NOTE — Patient Instructions (Signed)
Medication Instructions:  Your physician recommends that you continue on your current medications as directed. Please refer to the Current Medication list given to you today.  If you need a refill on your cardiac medications before your next appointment, please call your pharmacy.   Lab work: NONE  Testing/Procedures: NONE  Follow-Up: At [facility], you and your health needs are our priority.  As part of our continuing mission to provide you with exceptional heart care, we have created designated Provider Care Teams.  These Care Teams include your primary Cardiologist (physician) and Advanced Practice Providers (APPs -  Physician Assistants and Nurse Practitioners) who all work together to provide you with the care you need, when you need it. You may see Jonathan Berry, MD or one of the following Advanced Practice Providers on your designated Care Team:    Luke Kilroy, PA-C  Callie Goodrich, PA-C  Jesse Cleaver, FNP Your physician wants you to follow-up in 1 year        

## 2019-02-05 NOTE — Assessment & Plan Note (Signed)
History of hyperlipidemia on statin therapy with lipid profile performed 05/09/2018 revealing total cholesterol 130, LDL 71 and HDL of 51.

## 2019-02-09 ENCOUNTER — Encounter: Payer: Self-pay | Admitting: Internal Medicine

## 2019-02-09 ENCOUNTER — Other Ambulatory Visit: Payer: Self-pay

## 2019-02-09 ENCOUNTER — Ambulatory Visit (INDEPENDENT_AMBULATORY_CARE_PROVIDER_SITE_OTHER): Payer: Medicare HMO | Admitting: Internal Medicine

## 2019-02-09 VITALS — BP 144/66 | HR 56 | Temp 97.6°F | Ht 67.0 in | Wt 205.2 lb

## 2019-02-09 DIAGNOSIS — K219 Gastro-esophageal reflux disease without esophagitis: Secondary | ICD-10-CM | POA: Diagnosis not present

## 2019-02-09 MED ORDER — PANTOPRAZOLE SODIUM 40 MG PO TBEC: 40.0000 mg | DELAYED_RELEASE_TABLET | Freq: Two times a day (BID) | ORAL | 0 refills | Status: DC

## 2019-02-09 MED ORDER — PANTOPRAZOLE SODIUM 40 MG PO TBEC: 40.0000 mg | DELAYED_RELEASE_TABLET | Freq: Two times a day (BID) | ORAL | 3 refills | Status: DC

## 2019-02-09 NOTE — Progress Notes (Signed)
Primary Care Physician:  Chesley Noon, MD Primary Gastroenterologist:  Dr.   Pre-Procedure History & Physical: HPI:  Curtis Flores is a 63 y.o. male here for 1 year follow-up.  Longstanding GERD.  Well controlled on Protonix 40 mg once daily to twice daily.  No dysphagia.  No melena or rectal bleeding.  Negative colonoscopy 2017; due for average rescreening 2027.  Since he was last seen here, he underwent PCI for an occluded stent.  He remains on Eliquis and Plavix.  Past Medical History:  Diagnosis Date  . Abnormal CT scan, stomach   . Chronic systolic dysfunction of left ventricle   . Coronary artery disease    lateral STEMI 02/22/15 3/10 cutting balloon to ISR of the o/pLCX  . GERD (gastroesophageal reflux disease)   . Hyperlipidemia   . Hypertension   . Ischemic cardiomyopathy   . LBBB (left bundle branch block)   . Leukocytosis    followed by hematology, reactive  . Lymphadenopathy   . Myocardial infarction ([place]) 02/2015  . Psoriasis 2003  . Psoriatic arthritis ([place])   . Pulmonary fibrosis ([place])   . Rheumatoid arthritis(714.0) 2012  . Typical atrial flutter [facility])     Past Surgical History:  Procedure Laterality Date  . CARDIAC CATHETERIZATION N/A 02/22/2015   Procedure: Left Heart Cath and Coronary Angiography;  Surgeon: Lorretta Harp, MD;  Location: [place] CV LAB;  Service: Cardiovascular;  Laterality: N/A;  . CARDIAC CATHETERIZATION N/A 02/22/2015   Procedure: Coronary Stent Intervention;  Surgeon: Lorretta Harp, MD;  Location: [place] CV LAB;  Service: Cardiovascular;  Laterality: N/A;  . CARDIOVERSION N/A 04/12/2015   Procedure: CARDIOVERSION;  Surgeon: Larey Dresser, MD;  Location: [facility] ENDOSCOPY;  Service: Cardiovascular;  Laterality: N/A;  . COLONOSCOPY  07/01/2003   [id number] colonic mucosa except for the proximal right colon in the area of ICV which was not seen completely due to inadequate bowel prep. followed with ACBE which was normal.    . COLONOSCOPY N/A 08/24/2015   Dr. Gala Romney: Normal colon. Next colonoscopy in 10 years.  . CORONARY BALLOON ANGIOPLASTY N/A 05/12/2018   Procedure: CORONARY BALLOON ANGIOPLASTY;  Surgeon: Belva Crome, MD;  Location: [place] CV LAB;  Service: Cardiovascular;  Laterality: N/A;  . ELECTROPHYSIOLOGIC STUDY N/A 05/30/2015   Atrial fibrillation ablation by Dr Rayann Heman  . ESOPHAGOGASTRODUODENOSCOPY N/A 08/24/2015   Dr. Gala Romney: Medium-sized hiatal hernia, erosive gastropathy. [place] lesions. Esophageal mucosa distally suggestive of short segment Barrett's esophagus. Not confirmed on biopsy. Gastric biopsy with minimal chronic inflammation  . GIVENS CAPSULE STUDY N/A 04/17/2016   Procedure: GIVENS CAPSULE STUDY;  Surgeon: Daneil Dolin, MD;  [facility];  Service: Endoscopy;  Laterality: N/A;  Pt to arrive at 8:00 am for 8:30 am appt  . LEFT HEART CATH AND CORONARY ANGIOGRAPHY N/A 05/11/2018   Procedure: LEFT HEART CATH AND CORONARY ANGIOGRAPHY;  Surgeon: Belva Crome, MD;  Location: [place] CV LAB;  Service: Cardiovascular;  Laterality: N/A;  . TEE WITHOUT CARDIOVERSION N/A 04/12/2015   Procedure: TRANSESOPHAGEAL ECHOCARDIOGRAM (TEE);  Surgeon: Larey Dresser, MD;  Location: [place];  Service: Cardiovascular;  Laterality: N/A;    Prior to Admission medications   Medication Sig Start Date End Date Taking? Authorizing Provider  amLODipine (NORVASC) 5 MG tablet TAKE 1 TABLET BY MOUTH EVERY DAY 02/05/19  Yes Lorretta Harp, MD  apixaban (ELIQUIS) 5 MG TABS tablet Take 1 tablet (5 mg total) by mouth  2 (two) times daily. 12/09/18  Yes Lorretta Harp, MD  atorvastatin (LIPITOR) 80 MG tablet TAKE 1 TABLET (80 MG TOTAL) DAILY AT 6 PM. (OVERDUE FOR OFFICE VISIT PLEASE CALL FOR APPOINTMENT) 01/15/19  Yes Lorretta Harp, MD  azaTHIOprine (IMURAN) 50 MG tablet TAKE 1 TABLET TWICE DAILY 10/26/18  Yes Deveshwar, Abel Presto, MD  Clobetasol Prop Emollient Base 0.05 % emollient cream APPLY TO  AFFECTED AREA TWICE A DAY 06/22/18  Yes Deveshwar, Abel Presto, MD  clopidogrel (PLAVIX) 75 MG tablet Take 1 tablet (75 mg total) by mouth daily. 02/05/19  Yes Lorretta Harp, MD  clopidogrel (PLAVIX) 75 MG tablet Take 1 tablet (75 mg total) by mouth daily. 02/05/19  Yes Lorretta Harp, MD  furosemide (LASIX) 40 MG tablet TAKE 1 TABLET (40 MG TOTAL) BY MOUTH 2 (TWO) TIMES DAILY. 11/27/18  Yes Lorretta Harp, MD  losartan (COZAAR) 100 MG tablet  12/09/18  Yes [provider]  ORENCIA CLICKJECT 0000000 MG/ML SOAJ INJECT 125MG  (1 SYRINGE) SUBCUTANEOUSLY ONCE A WEEK.  (STORE IN REFRIGERATOR IMMEDIATELY.) 12/15/18  Yes Deveshwar, Abel Presto, MD  pantoprazole (PROTONIX) 40 MG tablet Take 1 tablet (40 mg total) by mouth 2 (two) times daily before a meal. 11/10/18  Yes Mahala Menghini, PA-C  potassium chloride SA (K-DUR) 20 MEQ tablet Take 2 tablets (40 mEq total) by mouth 2 (two) times daily. 07/29/18  Yes Lorretta Harp, MD  predniSONE (DELTASONE) 5 MG tablet Take 1.5 tablets by mouth daily with breakfast. 11/20/18  Yes Ofilia Neas, PA-C  sotalol (BETAPACE) 120 MG tablet Take 1 tablet (120 mg total) by mouth every 12 (twelve) hours. 07/29/18  Yes Lorretta Harp, MD    Allergies as of 02/09/2019  . (No Known Allergies)    Family History  Problem Relation Age of Onset  . Hypertension Mother   . Colon cancer Neg Hx     Social History   Socioeconomic History  . Marital status: Married    Spouse name: Not on file  . Number of children: 2  . Years of education: Not on file  . Highest education level: Not on file  Occupational History  . Occupation: unemployed    Comment: not working do to arthritis; used to be Patent attorney for a [employer]  . Financial resource strain: Not on file  . Food insecurity    Worry: Not on file    Inability: Not on file  . Transportation needs    Medical: Not on file    Non-medical: Not on file  Tobacco Use  . Smoking status:  Former Smoker    Packs/day: 1.00    Years: 30.00    Pack years: 30.00    Types: Cigarettes    Quit date: 03/21/2003    Years since quitting: 15.9  . Smokeless tobacco: Never Used  Substance and Sexual Activity  . Alcohol use: No    Alcohol/week: 0.0 standard drinks    Comment: H/O case of beer weekly x 20 years, quiting in 2000-ish.  . Drug use: No    Comment: H/O marijuana use many years ago.  [name] Sexual activity: Yes    Birth control/protection: None  Lifestyle  . Physical activity    Days per week: Not on file    Minutes per session: Not on file  . Stress: Not on file  Relationships  . Social Herbalist on phone: Not on file    Gets together:  Not on file    Attends religious service: Not on file    Active member of club or organization: Not on file    Attends meetings of clubs or organizations: Not on file    Relationship status: Not on file  . Intimate partner violence    Fear of current or ex partner: Not on file    Emotionally abused: Not on file    Physically abused: Not on file    Forced sexual activity: Not on file  Other Topics Concern  . Not on file  Social History Narrative  . Not on file    Review of Systems: See HPI, otherwise negative ROS  Physical Exam: BP (!) 144/66   Pulse (!) 56   Temp 97.6 F (36.4 C) (Oral)   Ht 5\' 7"  (1.702 m)   Wt 205 lb 3.2 oz (93.1 kg)   BMI 32.14 kg/m  General:   Alert,  Well-developed, well-nourished, pleasant and cooperative in NAD Neck:  Supple; no masses or thyromegaly. No significant cervical adenopathy. Lungs:  Clear throughout to auscultation.   No wheezes, crackles, or rhonchi. No acute distress. Heart:  Regular rate and rhythm; no murmurs, clicks, rubs,  or gallops. Abdomen: Non-distended, normal bowel sounds.  Soft and nontender without appreciable mass or hepatosplenomegaly.  Pulses:  Normal pulses noted. Extremities:  Without clubbing or edema.  Impression/Plan: Pleasant 63 year old gentleman  longstanding GERD well-controlled on Protonix 40 mg daily to twice daily.  No alarm symptoms. PCI since I last saw patient doing well on Plavix and Eliquis. Due for screening colonoscopy 2027.  Recommendations:    Continue Protonix 40 mg once to twice daily (disp 60 with no refill and RX for 180 tablets with 3 refills - mail in )  GERD information provided  Screening colonoscopy 2027  Office visit in 1 year    Notice: This dictation was prepared with Dragon dictation along with smaller phrase technology. Any transcriptional errors that result from this process are unintentional and may not be corrected upon review.

## 2019-02-09 NOTE — Patient Instructions (Addendum)
Continue Protonix 40 mg once to twice daily (disp 60 with no refill and RX for 180 tablets with 3 refills - mail in )  GERD information provided  Screening colonoscopy 2027  Office visit in 1 year

## 2019-02-23 ENCOUNTER — Other Ambulatory Visit: Payer: Self-pay | Admitting: Physician Assistant

## 2019-02-23 NOTE — Telephone Encounter (Signed)
Last Visit: 11/20/2018 Next Visit: 04/23/2019  Patient is now on 5mg  po every day.   Okay to refill per Dr. Estanislado Pandy.

## 2019-03-08 ENCOUNTER — Other Ambulatory Visit: Payer: Self-pay | Admitting: Internal Medicine

## 2019-03-09 ENCOUNTER — Other Ambulatory Visit: Payer: Self-pay

## 2019-03-09 ENCOUNTER — Inpatient Hospital Stay (HOSPITAL_COMMUNITY): Payer: Medicare HMO | Attending: Hematology

## 2019-03-09 DIAGNOSIS — Z87891 Personal history of nicotine dependence: Secondary | ICD-10-CM | POA: Diagnosis not present

## 2019-03-09 DIAGNOSIS — D72829 Elevated white blood cell count, unspecified: Secondary | ICD-10-CM | POA: Diagnosis present

## 2019-03-09 DIAGNOSIS — I10 Essential (primary) hypertension: Secondary | ICD-10-CM | POA: Insufficient documentation

## 2019-03-09 DIAGNOSIS — I255 Ischemic cardiomyopathy: Secondary | ICD-10-CM | POA: Insufficient documentation

## 2019-03-09 DIAGNOSIS — M069 Rheumatoid arthritis, unspecified: Secondary | ICD-10-CM | POA: Insufficient documentation

## 2019-03-09 DIAGNOSIS — I252 Old myocardial infarction: Secondary | ICD-10-CM | POA: Insufficient documentation

## 2019-03-09 DIAGNOSIS — Z7901 Long term (current) use of anticoagulants: Secondary | ICD-10-CM | POA: Diagnosis not present

## 2019-03-09 DIAGNOSIS — E785 Hyperlipidemia, unspecified: Secondary | ICD-10-CM | POA: Insufficient documentation

## 2019-03-09 DIAGNOSIS — M2550 Pain in unspecified joint: Secondary | ICD-10-CM | POA: Diagnosis not present

## 2019-03-09 DIAGNOSIS — I2510 Atherosclerotic heart disease of native coronary artery without angina pectoris: Secondary | ICD-10-CM | POA: Insufficient documentation

## 2019-03-09 DIAGNOSIS — J8410 Pulmonary fibrosis, unspecified: Secondary | ICD-10-CM | POA: Diagnosis not present

## 2019-03-09 DIAGNOSIS — Z79899 Other long term (current) drug therapy: Secondary | ICD-10-CM | POA: Diagnosis not present

## 2019-03-09 DIAGNOSIS — Z8249 Family history of ischemic heart disease and other diseases of the circulatory system: Secondary | ICD-10-CM | POA: Diagnosis not present

## 2019-03-09 DIAGNOSIS — L4050 Arthropathic psoriasis, unspecified: Secondary | ICD-10-CM | POA: Insufficient documentation

## 2019-03-09 DIAGNOSIS — K219 Gastro-esophageal reflux disease without esophagitis: Secondary | ICD-10-CM | POA: Insufficient documentation

## 2019-03-09 LAB — COMPREHENSIVE METABOLIC PANEL
ALT: 31 U/L (ref 0–44)
AST: 33 U/L (ref 15–41)
Albumin: 3.6 g/dL (ref 3.5–5.0)
Alkaline Phosphatase: 61 U/L (ref 38–126)
Anion gap: 8 (ref 5–15)
BUN: 14 mg/dL (ref 8–23)
CO2: 29 mmol/L (ref 22–32)
Calcium: 9.1 mg/dL (ref 8.9–10.3)
Chloride: 103 mmol/L (ref 98–111)
Creatinine, Ser: 0.74 mg/dL (ref 0.61–1.24)
GFR calc Af Amer: >60 mL/min (ref >60)
GFR calc non Af Amer: >60 mL/min (ref >60)
Glucose, Bld: 98 mg/dL (ref 70–99)
Potassium: 4.1 mmol/L (ref 3.5–5.1)
Sodium: 140 mmol/L (ref 135–145)
Total Bilirubin: 0.6 mg/dL (ref 0.3–1.2)
Total Protein: 7.4 g/dL (ref 6.5–8.1)

## 2019-03-09 LAB — CBC WITH DIFFERENTIAL/PLATELET
Abs Immature Granulocytes: 0.13 10*3/uL — ABNORMAL HIGH (ref 0.00–0.07)
Basophils Absolute: 0.1 10*3/uL (ref 0.0–0.1)
Basophils Relative: 1 %
Eosinophils Absolute: 0.1 10*3/uL (ref 0.0–0.5)
Eosinophils Relative: 1 %
HCT: 47.7 % (ref 39.0–52.0)
Hemoglobin: 14.9 g/dL (ref 13.0–17.0)
Immature Granulocytes: 1 %
Lymphocytes Relative: 24 %
Lymphs Abs: 3.5 10*3/uL (ref 0.7–4.0)
MCH: 28.2 pg (ref 26.0–34.0)
MCHC: 31.2 g/dL (ref 30.0–36.0)
MCV: 90.2 fL (ref 80.0–100.0)
Monocytes Absolute: 1.1 10*3/uL — ABNORMAL HIGH (ref 0.1–1.0)
Monocytes Relative: 7 %
Neutro Abs: 9.6 10*3/uL — ABNORMAL HIGH (ref 1.7–7.7)
Neutrophils Relative %: 66 %
Platelets: 244 10*3/uL (ref 150–400)
RBC: 5.29 MIL/uL (ref 4.22–5.81)
RDW: 13.2 % (ref 11.5–15.5)
WBC: 14.5 10*3/uL — ABNORMAL HIGH (ref 4.0–10.5)
nRBC: 0 % (ref 0.0–0.2)

## 2019-03-09 LAB — LACTATE DEHYDROGENASE: LDH: 178 U/L (ref 98–192)

## 2019-03-16 ENCOUNTER — Encounter (HOSPITAL_COMMUNITY): Payer: Self-pay | Admitting: Hematology

## 2019-03-16 ENCOUNTER — Other Ambulatory Visit: Payer: Self-pay

## 2019-03-16 ENCOUNTER — Inpatient Hospital Stay (HOSPITAL_BASED_OUTPATIENT_CLINIC_OR_DEPARTMENT_OTHER): Payer: Medicare HMO | Admitting: Hematology

## 2019-03-16 VITALS — BP 148/82 | HR 54 | Temp 98.6°F | Resp 16 | Wt 203.6 lb

## 2019-03-16 DIAGNOSIS — D72829 Elevated white blood cell count, unspecified: Secondary | ICD-10-CM

## 2019-03-16 NOTE — Progress Notes (Signed)
[place], [place] [address]   CLINIC:  Medical Oncology/Hematology  PCP:  Chesley Noon, MD [place] [id number] [phone]   REASON FOR VISIT:  Follow-up for Leukocytosis   CURRENT THERAPY: Clinical Surveillance     INTERVAL HISTORY:  Curtis Flores 64 y.o. male seen for follow-up of leukocytosis. Denies any fevers, night sweats or weight loss. Appetite and energy levels are 100%. He is currently taking prednisone 5 mg daily. Denies any bleeding per rectum or melena. No ER visits or hospitalizations.   REVIEW OF SYSTEMS:  Review of Systems  Musculoskeletal: Positive for arthralgias.  All other systems reviewed and are negative.    PAST MEDICAL/SURGICAL HISTORY:  Past Medical History:  Diagnosis Date  . Abnormal CT scan, stomach   . Chronic systolic dysfunction of left ventricle   . Coronary artery disease    lateral STEMI 02/22/15 3/10 cutting balloon to ISR of the o/pLCX  . GERD (gastroesophageal reflux disease)   . Hyperlipidemia   . Hypertension   . Ischemic cardiomyopathy   . LBBB (left bundle branch block)   . Leukocytosis    followed by hematology, reactive  . Lymphadenopathy   . Myocardial infarction ([place]) 02/2015  . Psoriasis 2003  . Psoriatic arthritis ([place])   . Pulmonary fibrosis ([place])   . Rheumatoid arthritis(714.0) 2012  . Typical atrial flutter [facility])    Past Surgical History:  Procedure Laterality Date  . CARDIAC CATHETERIZATION N/A 02/22/2015   Procedure: Left Heart Cath and Coronary Angiography;  Surgeon: Lorretta Harp, MD;  Location: [place] CV LAB;  Service: Cardiovascular;  Laterality: N/A;  . CARDIAC CATHETERIZATION N/A 02/22/2015   Procedure: Coronary Stent Intervention;  Surgeon: Lorretta Harp, MD;  Location: [place] [facility];  Service: Cardiovascular;  Laterality: N/A;  . CARDIOVERSION N/A 04/12/2015   Procedure: CARDIOVERSION;  Surgeon: Larey Dresser, MD;   Location: [facility] ENDOSCOPY;  Service: Cardiovascular;  Laterality: N/A;  . COLONOSCOPY  07/01/2003   TDV:VOHYWV colonic mucosa except for the proximal right colon in the area of ICV which was not seen completely due to inadequate bowel prep. followed with ACBE which was normal.   . COLONOSCOPY N/A 08/24/2015   Dr. Gala Romney: Normal colon. Next colonoscopy in 10 years.  . CORONARY BALLOON ANGIOPLASTY N/A 05/12/2018   Procedure: CORONARY BALLOON ANGIOPLASTY;  Surgeon: Belva Crome, MD;  Location: [place] CV LAB;  Service: Cardiovascular;  Laterality: N/A;  . ELECTROPHYSIOLOGIC STUDY N/A 05/30/2015   Atrial fibrillation ablation by Dr Rayann Heman  . ESOPHAGOGASTRODUODENOSCOPY N/A 08/24/2015   Dr. Gala Romney: Medium-sized hiatal hernia, erosive gastropathy. [place] lesions. Esophageal mucosa distally suggestive of short segment Barrett's esophagus. Not confirmed on biopsy. Gastric biopsy with minimal chronic inflammation  . GIVENS CAPSULE STUDY N/A 04/17/2016   Procedure: GIVENS CAPSULE STUDY;  Surgeon: Daneil Dolin, MD;  [facility];  Service: Endoscopy;  Laterality: N/A;  Pt to arrive at 8:00 am for 8:30 am appt  . LEFT HEART CATH AND CORONARY ANGIOGRAPHY N/A 05/11/2018   Procedure: LEFT HEART CATH AND CORONARY ANGIOGRAPHY;  Surgeon: Belva Crome, MD;  Location: [place] [facility] LAB;  Service: Cardiovascular;  Laterality: N/A;  . TEE WITHOUT CARDIOVERSION N/A 04/12/2015   Procedure: TRANSESOPHAGEAL ECHOCARDIOGRAM (TEE);  Surgeon: Larey Dresser, MD;  Location: [facility] ENDOSCOPY;  Service: Cardiovascular;  Laterality: N/A;     SOCIAL HISTORY:  Social History   Socioeconomic History  . Marital  status: Married    Spouse name: Not on file  . Number of children: 2  . Years of education: Not on file  . Highest education level: Not on file  Occupational History  . 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 Smoking  status: Former Smoker    Packs/day: 1.00    Years: 30.00    Pack years: 30.00    Types: Cigarettes    Quit date: 03/21/2003    Years since quitting: 16.0  . Smokeless tobacco: Never Used  Substance and Sexual Activity  . Alcohol use: No    Alcohol/week: 0.0 standard drinks    Comment: H/O case of beer weekly x 20 years, quiting in 2000-ish.  . Drug use: No    Comment: H/O marijuana use many years ago.  [name] Sexual activity: Yes    Birth control/protection: None  Other Topics Concern  . Not on file  Social History Narrative  . Not on file   Social Determinants of Health   Financial Resource Strain:   . Difficulty of Paying Living Expenses: Not on file  Food Insecurity:   . Worried About Charity fundraiser in the Last Year: Not on file  . Ran Out of Food in the Last Year: Not on file  Transportation Needs:   . Lack of Transportation (Medical): Not on file  . Lack of Transportation (Non-Medical): Not on file  Physical Activity:   . Days of Exercise per Week: Not on file  . Minutes of Exercise per Session: Not on file  Stress:   . Feeling of Stress : Not on file  Social Connections:   . Frequency of Communication with Friends and Family: Not on file  . Frequency of Social Gatherings with Friends and Family: Not on file  . Attends Religious Services: Not on file  . Active Member of Clubs or Organizations: Not on file  . Attends Archivist Meetings: Not on file  . Marital Status: Not on file  Intimate Partner Violence:   . Fear of Current or Ex-Partner: Not on file  . Emotionally Abused: Not on file  . Physically Abused: Not on file  . Sexually Abused: Not on file    FAMILY HISTORY:  Family History  Problem Relation Age of Onset  . Hypertension Mother   . Colon cancer Neg Hx     CURRENT MEDICATIONS:  Outpatient Encounter Medications as of 03/16/2019  Medication Sig  . amLODipine (NORVASC) 5 MG tablet TAKE 1 TABLET BY MOUTH EVERY DAY  . apixaban (ELIQUIS) 5  MG TABS tablet Take 1 tablet (5 mg total) by mouth 2 (two) times daily.  [name] atorvastatin (LIPITOR) 80 MG tablet TAKE 1 TABLET (80 MG TOTAL) DAILY AT 6 PM. (OVERDUE FOR OFFICE VISIT PLEASE CALL FOR APPOINTMENT)  . azaTHIOprine (IMURAN) 50 MG tablet TAKE 1 TABLET TWICE DAILY  . Clobetasol Prop Emollient Base 0.05 % emollient cream APPLY TO AFFECTED AREA TWICE A DAY  . clopidogrel (PLAVIX) 75 MG tablet Take 1 tablet (75 mg total) by mouth daily.  . furosemide (LASIX) 40 MG tablet TAKE 1 TABLET (40 MG TOTAL) BY MOUTH 2 (TWO) TIMES DAILY.  [name] losartan (COZAAR) 100 MG tablet   . ORENCIA CLICKJECT 270 MG/ML SOAJ INJECT 125MG (1 SYRINGE) SUBCUTANEOUSLY ONCE A WEEK.  (STORE IN REFRIGERATOR IMMEDIATELY.)  . pantoprazole (PROTONIX) 40 MG tablet Take 1 tablet (  40 mg total) by mouth 2 (two) times daily before a meal.  . potassium chloride SA (K-DUR) 20 MEQ tablet Take 2 tablets (40 mEq total) by mouth 2 (two) times daily.  . predniSONE (DELTASONE) 5 MG tablet Take 1 tablet (5 mg total) by mouth daily with breakfast.  . sotalol (BETAPACE) 120 MG tablet Take 1 tablet (120 mg total) by mouth every 12 (twelve) hours.  . [DISCONTINUED] clopidogrel (PLAVIX) 75 MG tablet Take 1 tablet (75 mg total) by mouth daily.  . [DISCONTINUED] pantoprazole (PROTONIX) 40 MG tablet Take 1 tablet (40 mg total) by mouth 2 (two) times daily.  . [DISCONTINUED] pantoprazole (PROTONIX) 40 MG tablet Take 1 tablet (40 mg total) by mouth 2 (two) times daily.   No facility-administered encounter medications on file as of 03/16/2019.    ALLERGIES:  No Known Allergies   PHYSICAL EXAM:  ECOG Performance status: 1  Vitals:   03/16/19 1000  BP: (!) 148/82  Pulse: (!) 54  Resp: 16  Temp: 98.6 F (37 C)  SpO2: 98%   Filed Weights   03/16/19 1000  Weight: 203 lb 9 oz (92.3 kg)    Physical Exam Constitutional:      Appearance: Normal appearance.  Cardiovascular:     Rate and Rhythm: Normal rate and regular rhythm.     Heart  sounds: Normal heart sounds.  Pulmonary:     Effort: Pulmonary effort is normal.     Breath sounds: Normal breath sounds.  Abdominal:     General: There is no distension.     Palpations: Abdomen is soft. There is no mass.  Musculoskeletal:        General: Normal range of motion.     Cervical back: Normal range of motion.  Skin:    General: Skin is warm.  Neurological:     General: No focal deficit present.     Mental Status: He is alert and oriented to person, place, and time.  Psychiatric:        Mood and Affect: Mood normal.        Behavior: Behavior normal.      LABORATORY DATA:  I have reviewed the labs as listed.  CBC    Component Value Date/Time   WBC 14.5 (H) 03/09/2019 1032   RBC 5.29 03/09/2019 1032   HGB 14.9 03/09/2019 1032   HGB 14.5 08/12/2011 1256   HCT 47.7 03/09/2019 1032   HCT 44.7 08/12/2011 1256   PLT 244 03/09/2019 1032   PLT 240 08/12/2011 1256   MCV 90.2 03/09/2019 1032   MCV 82.2 08/12/2011 1256   MCH 28.2 03/09/2019 1032   MCHC 31.2 03/09/2019 1032   RDW 13.2 03/09/2019 1032   RDW 14.7 (H) 08/12/2011 1256   LYMPHSABS 3.5 03/09/2019 1032   LYMPHSABS 5.5 (H) 08/12/2011 1256   MONOABS 1.1 (H) 03/09/2019 1032   MONOABS 1.6 (H) 08/12/2011 1256   EOSABS 0.1 03/09/2019 1032   EOSABS 0.2 08/12/2011 1256   BASOSABS 0.1 03/09/2019 1032   BASOSABS 0.1 08/12/2011 1256   CMP Latest Ref Rng & Units 03/09/2019 11/20/2018 09/30/2018  Glucose 70 - 99 mg/dL 98 93 110(H)  BUN 8 - 23 mg/dL '14 11 11  ' Creatinine 0.61 - 1.24 mg/dL 0.74 0.84 0.78  Sodium 135 - 145 mmol/L 140 143 142  Potassium 3.5 - 5.1 mmol/L 4.1 5.0 4.6  Chloride 98 - 111 mmol/L 103 104 107  CO2 22 - 32 mmol/L '29 28 30  ' Calcium 8.9 -  10.3 mg/dL 9.1 9.7 9.6  Total Protein 6.5 - 8.1 g/dL 7.4 6.6 -  Total Bilirubin 0.3 - 1.2 mg/dL 0.6 0.4 -  Alkaline Phos 38 - 126 U/L 61 - -  AST 15 - 41 U/L 33 28 -  ALT 0 - 44 U/L 31 28 -        ASSESSMENT & PLAN:   Leukocytosis 1. Neutrophilic  leukocytosis: -He was followed in our clinic for leukocytosis since 2013. -Bone marrow biopsy on 04/10/2011 showed normocellular marrow with trilineage hematopoiesis. Flow cytometry was negative. Lymphocytes with no aberrant phenotype. JAK2 V617F and BCR/ABL was negative. -He is on chronic prednisone which could be contributing to his leukocytosis. -We reviewed his labs which showed white count 14.5. Elevated absolute neutrophil count and slight elevation of monocytes. Hemoglobin and platelets are normal. -He'll be seen back in 1 year for follow-up.  2. Rheumatoid arthritis/psoriatic arthritis: -He is currently taking prednisone 5 mg daily. He is also taking Orencia injection weekly.      Orders placed this encounter:  Orders Placed This Encounter  Procedures  . CBC with Differential/Platelet  . Comprehensive metabolic panel  . Lactate dehydrogenase      Derek Jack, MD [place] [phone]

## 2019-03-16 NOTE — Patient Instructions (Addendum)
[place] at [facility] Inpatient Discharge Instructions  You were seen today by Dr. Delton Coombes. He went over your recent labs results. He will see you back in 1 year for labs and follow up.   Thank you for choosing [place] at [facility] to provide your oncology and hematology care.  To afford each patient quality time with our provider, please arrive at least 15 minutes before your scheduled appointment time.   If you have a lab appointment with the [facility] please come in thru the  Main Entrance and check in at the main information desk  You need to re-schedule your appointment should you arrive 10 or more minutes late.  We strive to give you quality time with our providers, and arriving late affects you and other patients whose appointments are after yours.  Also, if you no show three or more times for appointments you may be dismissed from the clinic at the providers discretion.     Again, thank you for choosing [facility].  Our hope is that these requests will decrease the amount of time that you wait before being seen by our physicians.       _____________________________________________________________  Should you have questions after your visit to [facility], please contact our office at [phone] between the hours of 8:00 a.m. and 4:30 p.m.  Voicemails left after 4:00 p.m. will not be returned until the following business day.  For prescription refill requests, have your pharmacy contact our office and allow 72 hours.    Cancer Center Support Programs:   > Cancer Support Group  2nd Tuesday of the month 1pm-2pm, Journey Room

## 2019-03-27 NOTE — Assessment & Plan Note (Signed)
1. Neutrophilic leukocytosis: -He was followed in our clinic for leukocytosis since 2013. -Bone marrow biopsy on 04/10/2011 showed normocellular marrow with trilineage hematopoiesis. Flow cytometry was negative. Lymphocytes with no aberrant phenotype. JAK2 V617F and BCR/ABL was negative. -He is on chronic prednisone which could be contributing to his leukocytosis. -We reviewed his labs which showed white count 14.5. Elevated absolute neutrophil count and slight elevation of monocytes. Hemoglobin and platelets are normal. -He'll be seen back in 1 year for follow-up.  2. Rheumatoid arthritis/psoriatic arthritis: -He is currently taking prednisone 5 mg daily. He is also taking Orencia injection weekly.

## 2019-04-09 ENCOUNTER — Ambulatory Visit (INDEPENDENT_AMBULATORY_CARE_PROVIDER_SITE_OTHER): Payer: Medicare HMO | Admitting: Primary Care

## 2019-04-09 ENCOUNTER — Ambulatory Visit (INDEPENDENT_AMBULATORY_CARE_PROVIDER_SITE_OTHER): Payer: Medicare HMO

## 2019-04-09 ENCOUNTER — Encounter: Payer: Self-pay | Admitting: Primary Care

## 2019-04-09 ENCOUNTER — Ambulatory Visit: Payer: Medicare HMO | Admitting: Pulmonary Disease

## 2019-04-09 ENCOUNTER — Other Ambulatory Visit: Payer: Self-pay

## 2019-04-09 VITALS — BP 110/80 | HR 50 | Ht 67.0 in | Wt 203.0 lb

## 2019-04-09 DIAGNOSIS — J8410 Pulmonary fibrosis, unspecified: Secondary | ICD-10-CM

## 2019-04-09 DIAGNOSIS — R0602 Shortness of breath: Secondary | ICD-10-CM

## 2019-04-09 LAB — BASIC METABOLIC PANEL
BUN: 11 mg/dL (ref 6–23)
CO2: 30 mEq/L (ref 19–32)
Calcium: 9.7 mg/dL (ref 8.4–10.5)
Chloride: 102 mEq/L (ref 96–112)
Creatinine, Ser: 0.82 mg/dL (ref 0.40–1.50)
GFR: 113.74 mL/min (ref >60.00)
Glucose, Bld: 100 mg/dL — ABNORMAL HIGH (ref 70–99)
Potassium: 4.3 mEq/L (ref 3.5–5.1)
Sodium: 140 mEq/L (ref 135–145)

## 2019-04-09 LAB — BRAIN NATRIURETIC PEPTIDE: Pro B Natriuretic peptide (BNP): 236 pg/mL — ABNORMAL HIGH (ref 0.0–100.0)

## 2019-04-09 NOTE — Progress Notes (Signed)
'@Patient'  ID: Curtis Flores, male    DOB: 1955-05-03, 64 y.o.   MRN: [id number]  Chief Complaint  Patient presents with  . Follow-up    Reports increased shortness of breath over the last few months, thinks this may be due to his recent weight gain.    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   HPI: 64 year old male, former smoker quit 2005 (30 pack year hx). PMH significant for pulmonary fibrosis, lobar pneumonia, acute respiratory failure, NSTEMI, PAF (chronic anticoagulation), HTN, ischemic cardiomyopathy, GERD, RA. Patient of Dr. Elsworth Soho, last seen on 03/20/18. Followed by rheumatology and is on Orencia, imuran and prednisone 56m daily.   04/09/2019 Patient presents today for annual follow-up for pulmonary fibrosis. He has noticed more shortness of breath over the last year. States that it depends on what he is doing. Reports dyspnea and fatigue with yard work. No trouble with day to day ADLs .No nocturnal symptoms. Remains on Orencia and Imuran. Prednisone decreased from 138mto 34m63mbout 6 months ago. Arthritis symptoms are controlled right now. He has a follow-up apt with Rheumatology this month. Put on 5 lbs since last visit. Less active over the last few months d.t pandemic. Taking 73m73msix twice daily Denies fever, chills, dizziness, chest pain, chest tightness, wheezing, cough.   Significant tests/ events 2012 -presented with a symmetric polyarthritis of elbows, wrists, hands, neck, shoulders &feet  [facility] with hilar lymphadenopathy With POS RA 89 & CCP 122, ANA neg, HLA B 27 neg, CK 87  PPD neg, hep panel neg  HRCT 05/2015- extensive regions of ground-glass attenuation and most  pronounced in the lower lobes of the lungs bilaterally. These areas demonstrate internal interstitial prominence with a large amount of microcystic honeycombingwith some cylindrical bronchiectasis and bronchiolectasis. moderate hiatal hernia was also noted.  Pulmonary function testing: PFTs 2013 showed intraprenchymal restricton with FVC around 71% &DLCO 56% c/w ILD, no airway obstruction.  Rpt PFTs 10/2012 show mild drop in FVC to 66%, DLCO preserved  PFT 05/2015 FEV1 70%, ratio 82, FVC 66%, no BD response, DLCO 46%. DLCO has decreased from 2014.  CT chest 05/12/15 showed ILD/NSIP , slight progression compared from 2013.   PFT 12/2015 >> FVC 71%, DLCO 45% unc  PFTs 03/2018 show drop in FVC to 53%, TLC to 49%, DLCO maintained at 41%  Ambulatory walk: 04/09/2019 No desaturation on RA after 3 Laps at normal pace    No Known Allergies  Immunization History  Administered Date(s) Administered  . Fluad Quad(high Dose 65+) 12/09/2018  . Influenza Split 12/22/2010  . Influenza Whole 01/18/2010  . Influenza, High Dose Seasonal PF 11/11/2017, 12/02/2018  . Influenza, Seasonal, Injecte, Preservative Fre 12/03/2014, 12/13/2015, 12/03/2017  . Influenza-Unspecified 12/03/2014, 12/13/2015, 11/12/2016  . Pneumococcal Conjugate-13 08/20/2016, 04/07/2018  . Pneumococcal Polysaccharide-23 01/22/2011, 03/05/2011  . Pneumococcal-Unspecified 01/22/2011, 03/05/2011    Past Medical History:  Diagnosis Date  . Abnormal CT scan, stomach   . Chronic systolic dysfunction of left ventricle   . Coronary artery disease    lateral STEMI 02/22/15 3/10 cutting balloon to ISR of the o/pLCX  . GERD (gastroesophageal reflux disease)   . Hyperlipidemia   . Hypertension   . Ischemic cardiomyopathy   .  LBBB (left bundle branch block)   . Leukocytosis    followed by hematology, reactive  . Lymphadenopathy   . Myocardial infarction ([place]) 02/2015  . Psoriasis 2003  . Psoriatic arthritis ([place])   . Pulmonary  fibrosis ([place])   . Rheumatoid arthritis(714.0) 2012  . Typical atrial flutter ([facility])     Tobacco History: Social History   Tobacco Use  Smoking Status Former Smoker  . Packs/day: 1.00  . Years: 30.00  . Pack years: 30.00  . Types: Cigarettes  . Quit date: 03/21/2003  . Years since quitting: 16.0  Smokeless Tobacco Never Used   Counseling given: Not Answered   Outpatient Medications Prior to Visit  Medication Sig Dispense Refill  . amLODipine (NORVASC) 5 MG tablet TAKE 1 TABLET BY MOUTH EVERY DAY 30 tablet 6  . apixaban (ELIQUIS) 5 MG TABS tablet Take 1 tablet (5 mg total) by mouth 2 (two) times daily. 60 tablet 1  . atorvastatin (LIPITOR) 80 MG tablet TAKE 1 TABLET (80 MG TOTAL) DAILY AT 6 PM. (OVERDUE FOR OFFICE VISIT PLEASE CALL FOR APPOINTMENT) 15 tablet 0  . azaTHIOprine (IMURAN) 50 MG tablet TAKE 1 TABLET TWICE DAILY 180 tablet 0  . Clobetasol Prop Emollient Base 0.05 % emollient cream APPLY TO AFFECTED AREA TWICE A DAY 30 g 2  . clopidogrel (PLAVIX) 75 MG tablet Take 1 tablet (75 mg total) by mouth daily. 90 tablet 3  . furosemide (LASIX) 40 MG tablet TAKE 1 TABLET (40 MG TOTAL) BY MOUTH 2 (TWO) TIMES DAILY. 180 tablet 1  . losartan (COZAAR) 100 MG tablet Take 100 mg by mouth daily.     [name] ORENCIA CLICKJECT 545 MG/ML SOAJ INJECT 125MG (1 SYRINGE) SUBCUTANEOUSLY ONCE A WEEK.  (STORE IN REFRIGERATOR IMMEDIATELY.) 4 mL 2  . pantoprazole (PROTONIX) 40 MG tablet Take 1 tablet (40 mg total) by mouth 2 (two) times daily before a meal. 180 tablet 0  . potassium chloride SA (K-DUR) 20 MEQ tablet Take 2 tablets (40 mEq total) by mouth 2 (two) times daily. 360 tablet 3  . predniSONE (DELTASONE) 5 MG tablet Take 1 tablet (5 mg total) by mouth daily with breakfast. 90 tablet 0  . sotalol (BETAPACE) 120 MG tablet Take 1 tablet (120 mg total) by mouth every 12 (twelve) hours. 180 tablet 3   No facility-administered medications prior to visit.   Review of Systems  Review of Systems   Constitutional: Negative.        Fatigue with activity  HENT: Negative.   Respiratory: Negative for cough, chest tightness, shortness of breath and wheezing.        Dyspnea  Cardiovascular: Negative.    Physical Exam  BP 110/80   Pulse (!) 50   Ht '5\' 7"'  (1.702 m)   Wt 203 lb (92.1 kg)   SpO2 97%   BMI 31.79 kg/m  Physical Exam Constitutional:      General: He is not in acute distress.    Appearance: Normal appearance. He is not ill-appearing or toxic-appearing.  HENT:     Head: Normocephalic and atraumatic.     Mouth/Throat:     Mouth: Mucous membranes are moist.     Pharynx: Oropharynx is clear.  Cardiovascular:     Rate and Rhythm: Regular rhythm. Bradycardia present.  Pulmonary:     Effort: Pulmonary effort is normal.     Breath sounds: No wheezing.     Comments: Fine rales left base. No respiratory distess. O2 97% RA.  Musculoskeletal:  General: Normal range of motion.     Comments: Arthritic changes to hands  Skin:    General: Skin is warm and dry.     Comments: No rash  Neurological:     General: No focal deficit present.     Mental Status: He is alert and oriented to person, place, and time. Mental status is at baseline.  Psychiatric:        Mood and Affect: Mood normal.        Behavior: Behavior normal.        Thought Content: Thought content normal.        Judgment: Judgment normal.      Lab Results:  CBC    Component Value Date/Time   WBC 14.5 (H) 03/09/2019 1032   RBC 5.29 03/09/2019 1032   HGB 14.9 03/09/2019 1032   HGB 14.5 08/12/2011 1256   HCT 47.7 03/09/2019 1032   HCT 44.7 08/12/2011 1256   PLT 244 03/09/2019 1032   PLT 240 08/12/2011 1256   MCV 90.2 03/09/2019 1032   MCV 82.2 08/12/2011 1256   MCH 28.2 03/09/2019 1032   MCHC 31.2 03/09/2019 1032   RDW 13.2 03/09/2019 1032   RDW 14.7 (H) 08/12/2011 1256   LYMPHSABS 3.5 03/09/2019 1032   LYMPHSABS 5.5 (H) 08/12/2011 1256   MONOABS 1.1 (H) 03/09/2019 1032   MONOABS 1.6 (H)  08/12/2011 1256   EOSABS 0.1 03/09/2019 1032   EOSABS 0.2 08/12/2011 1256   BASOSABS 0.1 03/09/2019 1032   BASOSABS 0.1 08/12/2011 1256    BMET    Component Value Date/Time   NA 140 03/09/2019 1032   K 4.1 03/09/2019 1032   CL 103 03/09/2019 1032   CO2 29 03/09/2019 1032   GLUCOSE 98 03/09/2019 1032   BUN 14 03/09/2019 1032   CREATININE 0.74 03/09/2019 1032   CREATININE 0.84 11/20/2018 1119   CALCIUM 9.1 03/09/2019 1032   GFRNONAA >60 03/09/2019 1032   GFRNONAA 92 11/20/2018 1119   GFRAA >60 03/09/2019 1032   GFRAA 106 11/20/2018 1119    BNP    Component Value Date/Time   BNP 636.2 (H) 05/09/2018 0545   BNP 337.3 (H) 04/05/2015 1043    ProBNP No results found for: PROBNP  Imaging: No results found.   Assessment & Plan:   Pulmonary fibrosis ([place]) - Patient reports icreased dyspnea and fatigue with moderate activity over last several months - Continues Orencia and imuran. Prednisone decreased from 77m to 586mabout 6 months ago per rheumatology. Consider increasing prednisone to 7.79m40maily d/t increased shortness of breath  - Ambulatory walk test today with no desaturation, completed 3 laps normal pace without stopping  - Encourage patient to increase physical activity and continue to keep weight down   Orders: - Labs and CXR today - Needs Spirometry with DLCO re: pulmonary fibrosis    Follow-up: - 6 months with Dr. AlvCandise CheP 04/09/2019

## 2019-04-09 NOTE — Progress Notes (Signed)
Please let patient know BNP was a little elevated, please confirm he is taking diuretic twice daily. If he is recommend taking an extra 1/2 tab in the morning x 3 days and follow-up with  PCP or cardiology. Non-urgent.

## 2019-04-09 NOTE — Assessment & Plan Note (Addendum)
-   Patient reports icreased dyspnea and fatigue with moderate activity over last several months - Continues Orencia and imuran. Prednisone decreased from 10mg  to 5mg  about 6 months ago per rheumatology. Consider increasing prednisone to 7.5mg  daily d/t increased shortness of breath  - Ambulatory walk test today with no desaturation, completed 3 laps normal pace without stopping  - Encourage patient to increase physical activity and continue to keep weight down   Orders: - Labs and CXR today - Needs Spirometry with DLCO re: pulmonary fibrosis    Follow-up: - 6 months with Dr. Elsworth Soho

## 2019-04-09 NOTE — Addendum Note (Signed)
Addended by: Suzzanne Cloud E on: 04/09/2019 12:21 PM   Modules accepted: Orders

## 2019-04-09 NOTE — Patient Instructions (Addendum)
Pleasure meeting you today Curtis Flores  Recommendations: - Check with rheumatology about increasing prednisone to 7.5mg  daily (d/t increased shortness of breath when prednisone was decreased from 10mg  down to 5mg ) - Increase you to increased physical activity and continue to keep weight down   Orders: - Labs and CXR today - Needs Spirometry with DLCO re: pulmonary fibrosis    Follow-up: - 6 months with Dr. Elsworth Soho  - If testing normal, recommending annual check up with cardiology    COVID-19 Vaccine Information can be found at: ShippingScam.co.uk For questions related to vaccine distribution or appointments, please email [email or web address] or call [phone].     Pulmonary Fibrosis  Pulmonary fibrosis is a type of lung disease that causes scarring. Over time, the scar tissue builds up in the air sacs of your lungs (alveoli). This makes it hard for you to breathe. Less oxygen can get into your blood. Scarring from pulmonary fibrosis gets worse over time. This damage is permanent and may lead to other serious health problems. What are the causes? There are many different causes of pulmonary fibrosis. Sometimes the cause is not known. This is called idiopathic pulmonary fibrosis. Other causes include:  Exposure to chemicals and substances found in agricultural, farm, Architect, or factory work. These include mold, asbestos, silica, metal dusts, and toxic fumes.  Sarcoidosis. In this disease, areas of inflammatory cells (granulomas) form and most often affect the lungs.  Autoimmune diseases. These include diseases such as rheumatoid arthritis, systemic sclerosis, or connective tissue disease.  Taking certain medicines. These include drugs used in radiation therapy or used to treat seizures, heart problems, and some infections. What increases the risk? You are more likely to develop this condition if:  You have a family  history of the disease.  You are older. The condition is more common in older adults.  You have a history of smoking.  You have a job that exposes you to certain chemicals.  You have gastroesophageal reflux disease (GERD). What are the signs or symptoms? Symptoms of this condition include:  Difficulty breathing that gets worse with activity.  Shortness of breath (dyspnea).  Dry, hacking cough.  Rapid, shallow breathing during exercise or while at rest.  Bluish skin and lips.  Loss of appetite.  Weakness.  Weight loss and fatigue.  Rounded and enlarged fingertips (clubbing). How is this diagnosed? This condition may be diagnosed based on:  Your symptoms and medical history.  A physical exam. You may also have tests, including:  A test that involves looking inside your lungs with an instrument (bronchoscopy).  Imaging studies of your lungs and heart.  Tests to measure how well you are breathing (pulmonary function tests).  Blood tests.  Tests to see how well your lungs work while you are walking (pulmonary stress test).  A procedure to remove a lung tissue sample to look at it under a microscope (biopsy). How is this treated? There is no cure for pulmonary fibrosis. Treatment focuses on managing symptoms and preventing scarring from getting worse. This may include:  Medicines, such as: ? Steroids to prevent permanent lung changes. ? Medicines to suppress your body's defense system (immune system). ? Medicines to help with lung function by reducing inflammation or scarring.  Ongoing monitoring with X-rays and lab work.  Oxygen therapy.  Pulmonary rehabilitation.  Surgery. In some cases, a lung transplant is possible. Follow these instructions at home:     Medicines  Take over-the-counter and prescription medicines only as told by your health  care provider.  Keep your vaccinations up to date as recommended by your health care provider. General  instructions  Do not use any products that contain nicotine or tobacco, such as cigarettes and e-cigarettes. If you need help quitting, ask your health care provider.  Get regular exercise, but do not overexert yourself. Ask your health care provider to suggest some activities that are safe for you to do. ? If you have physical limitations, you may get exercise by walking, using a stationary bike, or doing chair exercises. ? Ask your health care provider about using oxygen while exercising.  If you are exposed to chemicals and substances at work, make sure that you wear a mask or respirator at all times.  Join a pulmonary rehabilitation program or a support group for people with pulmonary fibrosis.  Eat small meals often so you do not get too full. Overeating can make breathing trouble worse.  Maintain a healthy weight. Lose weight if you need to.  Do breathing exercises as directed by your health care provider.  Keep all follow-up visits as told by your health care provider. This is important. Contact a health care provider if you:  Have symptoms that do not get better with medicines.  Are not able to be as active as usual.  Have trouble taking a deep breath.  Have a fever or chills.  Have blue lips or skin.  Have clubbing of your fingers. Get help right away if you:  Have a sudden worsening of your symptoms.  Have chest pain.  Cough up mucus that is dark in color.  Have a lot of headaches.  Get very confused or sleepy. Summary  Pulmonary fibrosis is a type of lung disease that causes scar tissue to build up in the air sacs of your lungs (alveoli) over time. Less oxygen can get into your blood. This makes it hard for you to breathe.  Scarring from pulmonary fibrosis gets worse over time. This damage is permanent and may lead to other serious health problems.  You are more likely to develop this condition if you have a family history of the condition or a job that  exposes you to certain chemicals.  There is no cure for pulmonary fibrosis. Treatment focuses on managing symptoms and preventing scarring from getting worse. This information is not intended to replace advice given to you by your health care provider. Make sure you discuss any questions you have with your health care provider. Document Revised: 03/26/2017 Document Reviewed: 03/26/2017 Elsevier Patient Education  [place].    Rheumatoid Arthritis Rheumatoid arthritis (RA) is a long-term (chronic) disease. RA causes inflammation in your joints. Your joints may feel painful, stiff, swollen, and warm. RA may start slowly. It most often affects the small joints of the hands and feet. It can also affect other parts of the body. Symptoms of RA often come and go. There is no cure for RA, but medicines can help your symptoms. What are the causes?  RA is an autoimmune disease. This means that your body's defense system (immune system) attacks healthy parts of your body by mistake. The exact cause of RA is not known. What increases the risk?  Being a woman.  Having a family history of RA or other diseases like RA.  Smoking.  Being overweight.  Being exposed to pollutants or chemicals. What are the signs or symptoms?  Morning stiffness that lasts longer than 30 minutes. This is often the first symptom.  Symptoms start  slowly. They are often worse in the morning.  As RA gets worse, symptoms may include: ? Pain, stiffness, swelling, warmth, and tenderness in joints on both sides of your body. ? Loss of energy. ? Not feeling hungry. ? Weight loss. ? A low fever. ? Dry eyes and a dry mouth. ? Firm lumps that grow under your skin. ? Changes in the way your joints look. ? Changes in the way your joints work.  Symptoms vary and they: ? Often come and go. ? Sometimes get worse for a period of time. These are called flares. How is this treated?   Treatment may  include: ? Taking good care of yourself. Be sure to rest as needed, eat a healthy diet, and exercise. ? Medicines. These may include:  Pain relievers.  Medicines to help with inflammation.  Disease-modifying antirheumatic drugs (DMARDs).  Medicines called biologic response modifiers. ? Physical therapy and occupational therapy. ? Surgery, if joint damage is very bad. Your doctor will work with you to find the best treatments. Follow these instructions at home: Activity  Return to your normal activities as told by your doctor. Ask your doctor what activities are safe for you.  Rest when you have a flare.  Exercise as told by your doctor. General instructions  Take over-the-counter and prescription medicines only as told by your doctor.  Keep all follow-up visits as told by your doctor. This is important. Where to find more information  [employer] of Rheumatology: www.rheumatology.[place]: www.arthritis.org Contact a doctor if:  You have a flare.  You have a fever.  You have problems because of your medicines. Get help right away if:  You have chest pain.  You have trouble breathing.  You get a hot, painful joint all of a sudden, and it is worse than your normal joint aches. Summary  RA is a long-term disease.  Symptoms of RA start slowly. They are often worse in the morning.  RA causes inflammation in your joints. This information is not intended to replace advice given to you by your health care provider. Make sure you discuss any questions you have with your health care provider. Document Revised: 10/22/2017 Document Reviewed: 10/22/2017 Elsevier Patient Education  2020 [employer].

## 2019-04-09 NOTE — Progress Notes (Signed)
Awaiting his labs, when back please let patient know CXR showed stable pulmonary fibrosis. No evidence changes or progression on imaging.

## 2019-04-12 NOTE — Progress Notes (Signed)
Called pt to give results but there was no answer and no VM to leave message.

## 2019-04-13 ENCOUNTER — Telehealth: Payer: Self-pay | Admitting: Primary Care

## 2019-04-13 NOTE — Telephone Encounter (Signed)
Awaiting his labs, when back please let patient know CXR showed stable pulmonary fibrosis. No evidence changes or progression on imaging.

## 2019-04-13 NOTE — Telephone Encounter (Signed)
Please let patient know BNP was a little elevated, please confirm he is taking diuretic twice daily. If he is recommend taking an extra 1/2 tab in the morning x 3 days and follow-up with PCP or cardiology. Non-urgent.   Spoke with patient about results. He verbalized understanding. He will increase his Lasix for 3 days. I will fax results to Dr. Fayrene Fearing office. He will contact them in the morning.   Nothing further needed at time of call.

## 2019-04-15 NOTE — Progress Notes (Deleted)
Office Visit Note  Patient: Curtis Flores             Date of Birth: 1956/02/16           MRN: [id number]             PCP: Chesley Noon, MD Referring: Dione Housekeeper, MD Visit Date: 04/23/2019 Occupation: @GUAROCC @  Subjective:  No chief complaint on file.   History of Present Illness: Curtis Flores is a 64 y.o. male ***   Activities of Daily Living:  Patient reports morning stiffness for *** {minute/hour:19697}.   Patient {ACTIONS;DENIES/REPORTS:21021675::"Denies"} nocturnal pain.  Difficulty dressing/grooming: {ACTIONS;DENIES/REPORTS:21021675::"Denies"} Difficulty climbing stairs: {ACTIONS;DENIES/REPORTS:21021675::"Denies"} Difficulty getting out of chair: {ACTIONS;DENIES/REPORTS:21021675::"Denies"} Difficulty using hands for taps, buttons, cutlery, and/or writing: {ACTIONS;DENIES/REPORTS:21021675::"Denies"}  No Rheumatology ROS completed.   PMFS History:  Patient Active Problem List   Diagnosis Date Noted  . Atrial fibrillation with RVR ([place]) 05/28/2018  . Chronic anticoagulation 05/28/2018  . NSTEMI (non-ST elevated myocardial infarction) ([place]) 05/09/2018  . Sepsis ([place]) 01/31/2018  . Ischemic cardiomyopathy 12/04/2016  . Leukocytosis 07/17/2016  . Pain in joint involving multiple sites 04/25/2016  . Psoriatic arthritis ([place]) 02/14/2016  . High risk medication use 02/14/2016  . NICM (nonischemic cardiomyopathy) ([place]) 08/29/2015  . Essential hypertension 08/29/2015  . Abnormal CT scan, stomach   . Hiatal hernia   . GERD (gastroesophageal reflux disease) 08/01/2015  . Weight loss, unintentional 08/01/2015  . Iron deficiency anemia 06/06/2015  . Heme positive stool 06/06/2015  . PAF (paroxysmal atrial fibrillation) ([place]) 05/30/2015  . CAD S/P percutaneous coronary angioplasty 04/07/2015  . 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 Acute on chronic systolic congestive heart failure ([place]) 04/07/2015  . LBBB (left bundle branch block) 04/07/2015  .  Wide-complex tachycardia ([place]) 04/07/2015  . Dyslipidemia, goal LDL below 70 03/07/2015  . Psoriasis   . Rheumatoid arthritis ([place])   . Pulmonary fibrosis ([place]) 03/21/2011    Past Medical History:  Diagnosis Date  . Abnormal CT scan, stomach   . Chronic systolic dysfunction of left ventricle   . Coronary artery disease    lateral STEMI 02/22/15 3/10 cutting balloon to ISR of the o/pLCX  . GERD (gastroesophageal reflux disease)   . Hyperlipidemia   . Hypertension   . Ischemic cardiomyopathy   . LBBB (left bundle branch block)   . Leukocytosis    followed by hematology, reactive  . Lymphadenopathy   . Myocardial infarction ([place]) 02/2015  . Psoriasis 2003  . Psoriatic arthritis ([place])   . Pulmonary fibrosis ([place])   . Rheumatoid arthritis(714.0) 2012  . Typical atrial flutter ([facility])     Family History  Problem Relation Age of Onset  . Hypertension Mother   . Colon cancer Neg Hx    Past Surgical History:  Procedure Laterality Date  . CARDIAC CATHETERIZATION N/A 02/22/2015   Procedure: Left Heart Cath and Coronary Angiography;  Surgeon: Lorretta Harp, MD;  Location: [facility];  Service: Cardiovascular;  Laterality: N/A;  . CARDIAC CATHETERIZATION N/A 02/22/2015   Procedure: Coronary Stent Intervention;  Surgeon: Lorretta Harp, MD;  Location: [facility];  Service: Cardiovascular;  Laterality: N/A;  . CARDIOVERSION N/A 04/12/2015   Procedure: CARDIOVERSION;  Surgeon: Larey Dresser, MD;  Location: [facility] ENDOSCOPY;  Service: Cardiovascular;  Laterality: N/A;  . COLONOSCOPY  07/01/2003   [id number] colonic mucosa except for the proximal right colon in the area of ICV which was not seen completely due to inadequate bowel  prep. followed with ACBE which was normal.   . COLONOSCOPY N/A 08/24/2015   Dr. Gala Romney: Normal colon. Next colonoscopy in 10 years.  . CORONARY BALLOON ANGIOPLASTY N/A 05/12/2018   Procedure: CORONARY BALLOON ANGIOPLASTY;  Surgeon: Belva Crome, MD;   Location: [facility];  Service: Cardiovascular;  Laterality: N/A;  . ELECTROPHYSIOLOGIC STUDY N/A 05/30/2015   Atrial fibrillation ablation by Dr Rayann Heman  . ESOPHAGOGASTRODUODENOSCOPY N/A 08/24/2015   Dr. Gala Romney: Medium-sized hiatal hernia, erosive gastropathy. [place] lesions. Esophageal mucosa distally suggestive of short segment Barrett's esophagus. Not confirmed on biopsy. Gastric biopsy with minimal chronic inflammation  . GIVENS CAPSULE STUDY N/A 04/17/2016   Procedure: GIVENS CAPSULE STUDY;  Surgeon: Daneil Dolin, MD;  [facility];  Service: Endoscopy;  Laterality: N/A;  Pt to arrive at 8:00 am for 8:30 am appt  . LEFT HEART CATH AND CORONARY ANGIOGRAPHY N/A 05/11/2018   Procedure: LEFT HEART CATH AND CORONARY ANGIOGRAPHY;  Surgeon: Belva Crome, MD;  Location: [facility];  Service: Cardiovascular;  Laterality: N/A;  . TEE WITHOUT CARDIOVERSION N/A 04/12/2015   Procedure: TRANSESOPHAGEAL ECHOCARDIOGRAM (TEE);  Surgeon: Larey Dresser, MD;  Location: [facility] ENDOSCOPY;  Service: Cardiovascular;  Laterality: N/A;   Social History   Social History Narrative  . Not on file   Immunization History  Administered Date(s) Administered  . Fluad Quad(high Dose 65+) 12/09/2018  . Influenza Split 12/22/2010  . Influenza Whole 01/18/2010  . Influenza, High Dose Seasonal PF 11/11/2017, 12/02/2018  . Influenza, Seasonal, Injecte, Preservative Fre 12/03/2014, 12/13/2015, 12/03/2017  . Influenza-Unspecified 12/03/2014, 12/13/2015, 11/12/2016  . Pneumococcal Conjugate-13 08/20/2016, 04/07/2018  . Pneumococcal Polysaccharide-23 01/22/2011, 03/05/2011  . Pneumococcal-Unspecified 01/22/2011, 03/05/2011     Objective: Vital Signs: There were no vitals taken for this visit.   Physical Exam   Musculoskeletal Exam: ***  CDAI Exam: CDAI Score: -- Patient Global: --; Provider Global: -- Swollen: --; Tender: -- Joint Exam 04/23/2019   No joint exam has been documented for this  visit   There is currently no information documented on the homunculus. Go to the Rheumatology activity and complete the homunculus joint exam.  Investigation: No additional findings.  Imaging: DG Chest 2 View  Result Date: 04/09/2019 CLINICAL DATA:  Pulmonary fibrosis, hypertension EXAM: CHEST - 2 VIEW COMPARISON:  03/20/2018, 08/20/2016 FINDINGS: Frontal and lateral views of the chest demonstrate a stable cardiac silhouette. Allowing for differences in technique, there has been no appreciable change in diffuse interstitial scarring and bibasilar fibrosis seen on multiple prior exams. No acute airspace disease, effusion, or pneumothorax. IMPRESSION: 1. Stable basilar predominant pulmonary fibrosis. Electronically Signed   By: Randa Ngo M.D.   On: 04/09/2019 12:23    Recent Labs: Lab Results  Component Value Date   WBC 14.5 (H) 03/09/2019   HGB 14.9 03/09/2019   PLT 244 03/09/2019   NA 140 04/09/2019   K 4.3 04/09/2019   CL 102 04/09/2019   CO2 30 04/09/2019   GLUCOSE 100 (H) 04/09/2019   BUN 11 04/09/2019   CREATININE 0.82 04/09/2019   BILITOT 0.6 03/09/2019   ALKPHOS 61 03/09/2019   AST 33 03/09/2019   ALT 31 03/09/2019   PROT 7.4 03/09/2019   ALBUMIN 3.6 03/09/2019   CALCIUM 9.7 04/09/2019   GFRAA >60 03/09/2019   QFTBGOLDPLUS NEGATIVE 01/21/2018    Speciality Comments: No specialty comments available.  Procedures:  No procedures performed Allergies: Patient has no known allergies.   Assessment / Plan:  Visit Diagnoses: No diagnosis found.  Orders: No orders of the defined types were placed in this encounter.  No orders of the defined types were placed in this encounter.   Face-to-face time spent with patient was *** minutes. Greater than 50% of time was spent in counseling and coordination of care.  Follow-Up Instructions: No follow-ups on file.   Earnestine Mealing, CMA  Note - This record has been created using Editor, commissioning.  Chart creation  errors have been sought, but may not always  have been located. Such creation errors do not reflect on  the standard of medical care.

## 2019-04-23 ENCOUNTER — Ambulatory Visit: Payer: Medicare HMO | Admitting: Physician Assistant

## 2019-04-27 ENCOUNTER — Other Ambulatory Visit: Payer: Self-pay

## 2019-04-27 ENCOUNTER — Other Ambulatory Visit: Payer: Self-pay | Admitting: Cardiovascular Disease

## 2019-04-27 MED ORDER — FUROSEMIDE 40 MG PO TABS: 40.0000 mg | ORAL_TABLET | Freq: Two times a day (BID) | ORAL | 0 refills | Status: DC

## 2019-05-01 ENCOUNTER — Other Ambulatory Visit: Payer: Self-pay | Admitting: Cardiology

## 2019-05-07 ENCOUNTER — Ambulatory Visit: Payer: Medicare HMO

## 2019-05-08 ENCOUNTER — Other Ambulatory Visit: Payer: Self-pay

## 2019-05-08 ENCOUNTER — Ambulatory Visit: Payer: Medicare HMO | Attending: Internal Medicine

## 2019-05-08 DIAGNOSIS — Z23 Encounter for immunization: Secondary | ICD-10-CM | POA: Insufficient documentation

## 2019-05-08 NOTE — Progress Notes (Signed)
   Covid-19 Vaccination Clinic  Name:  Curtis Flores    MRN: [id number] DOB: 1955-05-26  05/08/2019  [name] was observed post Covid-19 immunization for 15 minutes without incident. He was provided with Vaccine Information Sheet and instruction to access the V-Safe system.   [name] was instructed to call 911 with any severe reactions post vaccine: [name] Difficulty breathing  . Swelling of face and throat  . A fast heartbeat  . A bad rash all over body  . Dizziness and weakness   Immunizations Administered    Name Date Dose VIS Date Route   Moderna COVID-19 Vaccine 05/08/2019 10:07 AM 0.5 mL 02/02/2019 Intramuscular   Manufacturer: Moderna   Lot: [id number]   [place] [id number]

## 2019-05-11 ENCOUNTER — Other Ambulatory Visit: Payer: Self-pay

## 2019-05-11 MED ORDER — CLOPIDOGREL BISULFATE 75 MG PO TABS: 75.0000 mg | ORAL_TABLET | Freq: Every day | ORAL | 3 refills | Status: DC

## 2019-05-11 MED ORDER — AMLODIPINE BESYLATE 5 MG PO TABS: 5.0000 mg | ORAL_TABLET | Freq: Every day | ORAL | 6 refills | Status: DC

## 2019-05-11 MED ORDER — PREDNISONE 5 MG PO TABS: 5.0000 mg | ORAL_TABLET | Freq: Every day | ORAL | 0 refills | Status: DC

## 2019-05-12 ENCOUNTER — Other Ambulatory Visit: Payer: Self-pay

## 2019-05-12 DIAGNOSIS — D508 Other iron deficiency anemias: Secondary | ICD-10-CM

## 2019-05-12 MED ORDER — APIXABAN 5 MG PO TABS: 5.0000 mg | ORAL_TABLET | Freq: Two times a day (BID) | ORAL | 1 refills | Status: DC

## 2019-05-12 NOTE — Telephone Encounter (Signed)
Requested Prescriptions   Pending Prescriptions Disp Refills  . azaTHIOprine (IMURAN) 50 MG tablet 180 tablet 0    Sig: Take 1 tablet (50 mg total) by mouth 2 (two) times daily.  [name] atorvastatin (LIPITOR) 80 MG tablet 15 tablet 0    Sig: TAKE 1 TABLET (80 MG TOTAL) DAILY AT 6 PM. (OVERDUE FOR OFFICE VISIT PLEASE CALL FOR APPOINTMENT)   Signed Prescriptions Disp Refills  . apixaban (ELIQUIS) 5 MG TABS tablet 180 tablet 1    Sig: Take 1 tablet (5 mg total) by mouth 2 (two) times daily.    Authorizing Provider: Lorretta Harp    Ordering User: Allean Found

## 2019-05-13 ENCOUNTER — Other Ambulatory Visit: Payer: Self-pay

## 2019-05-13 DIAGNOSIS — D508 Other iron deficiency anemias: Secondary | ICD-10-CM

## 2019-05-13 NOTE — Telephone Encounter (Signed)
Requested Prescriptions   Pending Prescriptions Disp Refills  . azaTHIOprine (IMURAN) 50 MG tablet 180 tablet 0    Sig: Take 1 tablet (50 mg total) by mouth 2 (two) times daily.  [name] atorvastatin (LIPITOR) 80 MG tablet 15 tablet 0    Sig: TAKE 1 TABLET (80 MG TOTAL) DAILY AT 6 PM. (OVERDUE FOR OFFICE VISIT PLEASE CALL FOR APPOINTMENT)   Pt is requesting refills of these meds to be sent to [facility] mail delivery pharmacy. Will route to the nl refill team.

## 2019-05-14 ENCOUNTER — Telehealth: Payer: Self-pay

## 2019-05-14 DIAGNOSIS — D508 Other iron deficiency anemias: Secondary | ICD-10-CM

## 2019-05-14 MED ORDER — AZATHIOPRINE 50 MG PO TABS: 50.0000 mg | ORAL_TABLET | Freq: Two times a day (BID) | ORAL | 0 refills | Status: DC

## 2019-05-14 MED ORDER — ATORVASTATIN CALCIUM 80 MG PO TABS: ORAL_TABLET | ORAL | 3 refills | Status: DC

## 2019-05-14 NOTE — Telephone Encounter (Signed)
Send Imuran refill to Dr.Shaili Deveshawar.

## 2019-05-19 ENCOUNTER — Other Ambulatory Visit: Payer: Self-pay

## 2019-05-19 MED ORDER — AMLODIPINE BESYLATE 5 MG PO TABS: 5.0000 mg | ORAL_TABLET | Freq: Every day | ORAL | 3 refills | Status: DC

## 2019-05-21 ENCOUNTER — Other Ambulatory Visit: Payer: Self-pay

## 2019-05-21 DIAGNOSIS — D508 Other iron deficiency anemias: Secondary | ICD-10-CM

## 2019-05-21 MED ORDER — AZATHIOPRINE 50 MG PO TABS: 50.0000 mg | ORAL_TABLET | Freq: Two times a day (BID) | ORAL | 0 refills | Status: DC

## 2019-05-25 ENCOUNTER — Other Ambulatory Visit: Payer: Self-pay | Admitting: Rheumatology

## 2019-05-25 ENCOUNTER — Telehealth: Payer: Self-pay | Admitting: Rheumatology

## 2019-05-25 ENCOUNTER — Other Ambulatory Visit: Payer: Self-pay | Admitting: Pharmacist

## 2019-05-25 MED ORDER — APIXABAN 5 MG PO TABS: 5.0000 mg | ORAL_TABLET | Freq: Two times a day (BID) | ORAL | 1 refills | Status: DC

## 2019-05-25 NOTE — Telephone Encounter (Signed)
Patient's wife Curtis Flores called stating the pharmacy is waiting for a response from Dr. Estanislado Pandy regarding his prescription refill of Orencia.  Curtis Flores states he only has one injection remaining which he will take on Friday, 05/28/19.  Curtis Flores is requesting a return call to let her know when the prescription is sent.

## 2019-05-25 NOTE — Telephone Encounter (Signed)
ok 

## 2019-05-25 NOTE — Telephone Encounter (Signed)
Last Visit: 11/20/2018 Next Visit: 06/18/2019 Labs: 03/09/2019 WBC 14.5, Neutro Abs: 9.6, Monocytes Absolute 1.1, Abs Immature Granulocytes 0.13 Tb Gold: 01/21/18 Neg   Patient's wife [name] advised [facility] will need to update TB Gold. She states he may be able to come by on Friday for labs.   Okay to provide patient with sample of Orencia while awaiting test results to send prescription?

## 2019-05-26 NOTE — Telephone Encounter (Signed)
Patient's wife advised he is due to update labs prior to refilling prescription. Per Dr. Estanislado Pandy okay to give sample when he comes to update labs.

## 2019-05-26 NOTE — Telephone Encounter (Signed)
Sample reserved in sample fridge for patient. Will sign out when patient picks up sample.

## 2019-05-27 ENCOUNTER — Other Ambulatory Visit: Payer: Self-pay | Admitting: *Deleted

## 2019-05-27 ENCOUNTER — Other Ambulatory Visit: Payer: Self-pay

## 2019-05-27 DIAGNOSIS — Z79899 Other long term (current) drug therapy: Secondary | ICD-10-CM

## 2019-05-27 LAB — COMPLETE METABOLIC PANEL WITH GFR
AG Ratio: 1.4 (calc) (ref 1.0–2.5)
ALT: 23 U/L (ref 9–46)
AST: 27 U/L (ref 10–35)
Albumin: 3.7 g/dL (ref 3.6–5.1)
Alkaline phosphatase (APISO): 65 U/L (ref 35–144)
BUN: 9 mg/dL (ref 7–25)
CO2: 30 mmol/L (ref 20–32)
Calcium: 9.4 mg/dL (ref 8.6–10.3)
Chloride: 104 mmol/L (ref 98–110)
Creat: 0.72 mg/dL (ref 0.70–1.25)
GFR, Est African American: 113 mL/min/{1.73_m2} (ref >=60)
GFR, Est Non African American: 97 mL/min/{1.73_m2} (ref >=60)
Globulin: 2.6 g/dL (calc) (ref 1.9–3.7)
Glucose, Bld: 93 mg/dL (ref 65–99)
Potassium: 4.6 mmol/L (ref 3.5–5.3)
Sodium: 141 mmol/L (ref 135–146)
Total Bilirubin: 0.4 mg/dL (ref 0.2–1.2)
Total Protein: 6.3 g/dL (ref 6.1–8.1)

## 2019-05-27 LAB — CBC WITH DIFFERENTIAL/PLATELET
Absolute Monocytes: 725 cells/uL (ref 200–950)
Basophils Absolute: 87 cells/uL (ref 0–200)
Basophils Relative: 0.6 %
Eosinophils Absolute: 87 cells/uL (ref 15–500)
Eosinophils Relative: 0.6 %
HCT: 44.1 % (ref 38.5–50.0)
Hemoglobin: 14.5 g/dL (ref 13.2–17.1)
Lymphs Abs: 3422 cells/uL (ref 850–3900)
MCH: 28.4 pg (ref 27.0–33.0)
MCHC: 32.9 g/dL (ref 32.0–36.0)
MCV: 86.3 fL (ref 80.0–100.0)
MPV: 13 fL — ABNORMAL HIGH (ref 7.5–12.5)
Monocytes Relative: 5 %
Neutro Abs: 10179 cells/uL — ABNORMAL HIGH (ref 1500–7800)
Neutrophils Relative %: 70.2 %
Platelets: 228 10*3/uL (ref 140–400)
RBC: 5.11 10*6/uL (ref 4.20–5.80)
RDW: 13.1 % (ref 11.0–15.0)
Total Lymphocyte: 23.6 %
WBC: 14.5 10*3/uL — ABNORMAL HIGH (ref 3.8–10.8)

## 2019-05-28 ENCOUNTER — Other Ambulatory Visit: Payer: Self-pay | Admitting: *Deleted

## 2019-05-28 NOTE — Telephone Encounter (Signed)
Please wait to refill Orencia until TB gold has resulted.  We can refill Orencia if TB gold is negative.

## 2019-05-28 NOTE — Progress Notes (Signed)
Labs are stable.  WBC count is high because patient is on chronic prednisone .

## 2019-05-28 NOTE — Telephone Encounter (Signed)
Last Visit:11/20/2018 Next Visit:06/18/2019 Labs: 05/28/19 Labs are stable. WBC count is high because patient is on chronic prednisone . Tb Gold: 01/21/18 Neg (Updated 05/27/19, awaiting results.)  Okay to refill Orencia?

## 2019-05-28 NOTE — Telephone Encounter (Signed)
-----   Message from Bo Merino, MD sent at 05/28/2019  8:29 AM EDT ----- Labs are stable.  WBC count is high because patient is on chronic prednisone .

## 2019-05-29 LAB — QUANTIFERON-TB GOLD PLUS
Mitogen-NIL: >10 IU/mL
NIL: 0.04 IU/mL
QuantiFERON-TB Gold Plus: NEGATIVE
TB1-NIL: 0 IU/mL
TB2-NIL: 0.01 IU/mL

## 2019-05-31 MED ORDER — ORENCIA CLICKJECT 125 MG/ML ~~LOC~~ SOAJ: 125.0000 mg | SUBCUTANEOUS | 2 refills | Status: DC

## 2019-05-31 NOTE — Progress Notes (Signed)
TB gold negative

## 2019-05-31 NOTE — Telephone Encounter (Signed)
TB Gold resulted and is negative.

## 2019-06-07 ENCOUNTER — Other Ambulatory Visit (HOSPITAL_COMMUNITY)
Admission: RE | Admit: 2019-06-07 | Discharge: 2019-06-07 | Disposition: A | Discharge status: Home / Self Care | Payer: Medicare HMO | Source: Ambulatory Visit | Attending: Primary Care | Admitting: Primary Care

## 2019-06-07 DIAGNOSIS — Z20822 Contact with and (suspected) exposure to covid-19: Secondary | ICD-10-CM | POA: Diagnosis not present

## 2019-06-07 DIAGNOSIS — Z01812 Encounter for preprocedural laboratory examination: Secondary | ICD-10-CM | POA: Insufficient documentation

## 2019-06-07 LAB — SARS CORONAVIRUS 2 (TAT 6-24 HRS): SARS Coronavirus 2: NEGATIVE

## 2019-06-07 NOTE — Progress Notes (Signed)
Office Visit Note  Patient: Curtis Flores             Date of Birth: 06-26-1955           MRN: [id number]             PCP: Chesley Noon, MD Referring: Dione Housekeeper, MD Visit Date: 06/18/2019 Occupation: @GUAROCC @  Subjective:  Medication morning   History of Present Illness: KENSINGTON SCHIESSER is a 64 y.o. male with history of psoriatic arthritis and seropositive rheumatoid arthritis overlap.  Patient is on Orencia 125 mg sq injections once weekly, Imuran 50 mg 2 tablets daily, and prednisone 5 mg 1 tablet daily.  He has not missed any doses of Orencia or Imuran recently.  He denies any recent psoriatic or rheumatoid arthritis flares.  He experiences stiffness in both hands and both feet but denies any joint swelling or tenderness at this time.  He denies any SI joint pain at this time.  He has not had any Achilles tendinitis or plantar fasciitis.  He denies any active psoriasis.  Patient reports that he continues to follow-up with his pulmonologist closely.  He had PFTs performed on 06/10/2019 but has not heard the results yet.  He states he experiences intermittent wheezing but has not noticed any increased shortness of breath or coughing recently.  He denies any recent infections.  He has received both COVID-19 vaccinations.    Activities of Daily Living:  Patient reports morning stiffness for 0 none.   Patient Denies nocturnal pain.  Difficulty dressing/grooming: Denies Difficulty climbing stairs: Denies Difficulty getting out of chair: Denies Difficulty using hands for taps, buttons, cutlery, and/or writing: Denies  Review of Systems  Constitutional: Negative for fatigue and night sweats.  HENT: Negative for mouth sores, mouth dryness and nose dryness.   Eyes: Negative for redness and dryness.  Respiratory: Positive for shortness of breath. Negative for difficulty breathing.   Cardiovascular: Negative for chest pain, palpitations, hypertension, irregular heartbeat and swelling  in legs/feet.  Gastrointestinal: Negative for constipation and diarrhea.  Endocrine: Negative for increased urination.  Genitourinary: Negative for difficulty urinating and painful urination.  Musculoskeletal: Negative for arthralgias, joint pain, joint swelling, myalgias, muscle weakness, morning stiffness, muscle tenderness and myalgias.  Skin: Negative for color change, rash, hair loss, nodules/bumps, skin tightness, ulcers and sensitivity to sunlight.  Allergic/Immunologic: Negative for susceptible to infections.  Neurological: Negative for dizziness, fainting, numbness, memory loss, night sweats and weakness.  Hematological: Negative for bruising/bleeding tendency and swollen glands.  Psychiatric/Behavioral: Negative for depressed mood and sleep disturbance. The patient is not nervous/anxious.     PMFS History:  Patient Active Problem List   Diagnosis Date Noted   Atrial fibrillation with RVR ([place]) 05/28/2018   Chronic anticoagulation 05/28/2018   NSTEMI (non-ST elevated myocardial infarction) ([place]) 05/09/2018   Sepsis ([place]) 01/31/2018   Ischemic cardiomyopathy 12/04/2016   Leukocytosis 07/17/2016   Pain in joint involving multiple sites 04/25/2016   Psoriatic arthritis ([place]) 02/14/2016   High risk medication use 02/14/2016   NICM (nonischemic cardiomyopathy) ([place]) 08/29/2015   Essential hypertension 08/29/2015   Abnormal CT scan, stomach    Hiatal hernia    GERD (gastroesophageal reflux disease) 08/01/2015   Weight loss, unintentional 08/01/2015   Iron deficiency anemia 06/06/2015   Heme positive stool 06/06/2015   PAF (paroxysmal atrial fibrillation) ([place]) 05/30/2015   CAD S/P percutaneous coronary angioplasty 04/07/2015   Old lateral wall myocardial infarction 04/07/2015   Acute on chronic systolic congestive  heart failure ([place]) 04/07/2015   LBBB (left bundle branch block) 04/07/2015   Wide-complex tachycardia ([place]) 04/07/2015   Dyslipidemia, goal  LDL below 70 03/07/2015   Psoriasis    Rheumatoid arthritis ([place])    Pulmonary fibrosis ([place]) 03/21/2011    Past Medical History:  Diagnosis Date   Abnormal CT scan, stomach    Chronic systolic dysfunction of left ventricle    Coronary artery disease    lateral STEMI 02/22/15 3/10 cutting balloon to ISR of the o/pLCX   GERD (gastroesophageal reflux disease)    Hyperlipidemia    Hypertension    Ischemic cardiomyopathy    LBBB (left bundle branch block)    Leukocytosis    followed by hematology, reactive   Lymphadenopathy    Myocardial infarction ([place]) 02/2015   Psoriasis 2003   Psoriatic arthritis ([place])    Pulmonary fibrosis ([place])    Rheumatoid arthritis(714.0) 2012   Typical atrial flutter ([place])     Family History  Problem Relation Age of Onset   Hypertension Mother    Colon cancer Neg Hx    Past Surgical History:  Procedure Laterality Date   CARDIAC CATHETERIZATION N/A 02/22/2015   Procedure: Left Heart Cath and Coronary Angiography;  Surgeon: Lorretta Harp, MD;  Location: [facility];  Service: Cardiovascular;  Laterality: N/A;   CARDIAC CATHETERIZATION N/A 02/22/2015   Procedure: Coronary Stent Intervention;  Surgeon: Lorretta Harp, MD;  Location: [facility];  Service: Cardiovascular;  Laterality: N/A;   CARDIOVERSION N/A 04/12/2015   Procedure: CARDIOVERSION;  Surgeon: Larey Dresser, MD;  Location: [facility] ENDOSCOPY;  Service: Cardiovascular;  Laterality: N/A;   COLONOSCOPY  07/01/2003   [id number] colonic mucosa except for the proximal right colon in the area of ICV which was not seen completely due to inadequate bowel prep. followed with ACBE which was normal.    COLONOSCOPY N/A 08/24/2015   Dr. Gala Romney: Normal colon. Next colonoscopy in 10 years.   CORONARY BALLOON ANGIOPLASTY N/A 05/12/2018   Procedure: CORONARY BALLOON ANGIOPLASTY;  Surgeon: Belva Crome, MD;  Location: [facility];  Service: Cardiovascular;   Laterality: N/A;   ELECTROPHYSIOLOGIC STUDY N/A 05/30/2015   Atrial fibrillation ablation by Dr Rayann Heman   ESOPHAGOGASTRODUODENOSCOPY N/A 08/24/2015   Dr. Gala Romney: Medium-sized hiatal hernia, erosive gastropathy. Cameron lesions. Esophageal mucosa distally suggestive of short segment Barrett's esophagus. Not confirmed on biopsy. Gastric biopsy with minimal chronic inflammation   GIVENS CAPSULE STUDY N/A 04/17/2016   Procedure: GIVENS CAPSULE STUDY;  Surgeon: Daneil Dolin, MD;  [facility];  Service: Endoscopy;  Laterality: N/A;  Pt to arrive at 8:00 am for 8:30 am appt   LEFT HEART CATH AND CORONARY ANGIOGRAPHY N/A 05/11/2018   Procedure: LEFT HEART CATH AND CORONARY ANGIOGRAPHY;  Surgeon: Belva Crome, MD;  Location: [facility];  Service: Cardiovascular;  Laterality: N/A;   TEE WITHOUT CARDIOVERSION N/A 04/12/2015   Procedure: TRANSESOPHAGEAL ECHOCARDIOGRAM (TEE);  Surgeon: Larey Dresser, MD;  Location: [facility] ENDOSCOPY;  Service: Cardiovascular;  Laterality: N/A;   Social History   Social History Narrative   Not on file   Immunization History  Administered Date(s) Administered   Fluad Quad(high Dose 65+) 12/09/2018   Influenza Split 12/22/2010   Influenza Whole 01/18/2010   Influenza, High Dose Seasonal PF 11/11/2017, 12/02/2018   Influenza, Seasonal, Injecte, Preservative Fre 12/03/2014, 12/13/2015, 12/03/2017   Influenza-Unspecified 12/03/2014, 12/13/2015, 11/12/2016   Moderna SARS-COVID-2 Vaccination 05/08/2019, 06/09/2019   Pneumococcal Conjugate-13  08/20/2016, 04/07/2018   Pneumococcal Polysaccharide-23 01/22/2011, 03/05/2011   Pneumococcal-Unspecified 01/22/2011, 03/05/2011     Objective: Vital Signs: BP (!) 146/59 (BP Location: Left Arm, Patient Position: Sitting, Cuff Size: Normal)    Pulse (!) 48    Resp 18    Ht 5\' 7"  (1.702 m)    Wt 205 lb 12.8 oz (93.4 kg)    BMI 32.23 kg/m    Physical Exam Vitals and nursing note reviewed.    Constitutional:      Appearance: He is well-developed.  HENT:     Head: Normocephalic and atraumatic.  Eyes:     Conjunctiva/sclera: Conjunctivae normal.     Pupils: Pupils are equal, round, and reactive to light.  Pulmonary:     Effort: Pulmonary effort is normal.  Abdominal:     General: Bowel sounds are normal.     Palpations: Abdomen is soft.  Musculoskeletal:     Cervical back: Normal range of motion and neck supple.  Skin:    General: Skin is warm and dry.     Capillary Refill: Capillary refill takes less than 2 seconds.  Neurological:     Mental Status: He is alert and oriented to person, place, and time.  Psychiatric:        Behavior: Behavior normal.      Musculoskeletal Exam: C-spine, thoracic spine, and lumbar spine good ROM.  No midline spinal tenderness.  No SI joint tenderness.  Shoulder joints, elbow joints, wrist joints good ROM with no synovitis.  Synovial thickening of both wrist joints. flexion contractures of all DIP joints.  Synovial thickening of the right 1st-5th MCPs and left 1st, 2nd, and 3rd MCP joints.  Complete fist formation bilaterally.  Hip joints good ROM with no discomfort.  Knee joints good ROM with no warmth or effusion.  Ankle joints good ROM with no tenderness or inflammation.   CDAI Exam: CDAI Score: 0.4  Patient Global: 2 mm; Provider Global: 2 mm Swollen: 0 ; Tender: 0  Joint Exam 06/18/2019   No joint exam has been documented for this visit   There is currently no information documented on the homunculus. Go to the Rheumatology activity and complete the homunculus joint exam.  Investigation: No additional findings.  Imaging: No results found.  Recent Labs: Lab Results  Component Value Date   WBC 14.5 (H) 05/27/2019   HGB 14.5 05/27/2019   PLT 228 05/27/2019   NA 141 05/27/2019   K 4.6 05/27/2019   CL 104 05/27/2019   CO2 30 05/27/2019   GLUCOSE 93 05/27/2019   BUN 9 05/27/2019   CREATININE 0.72 05/27/2019   BILITOT 0.4  05/27/2019   ALKPHOS 61 03/09/2019   AST 27 05/27/2019   ALT 23 05/27/2019   PROT 6.3 05/27/2019   ALBUMIN 3.6 03/09/2019   CALCIUM 9.4 05/27/2019   GFRAA 113 05/27/2019   QFTBGOLDPLUS NEGATIVE 05/27/2019    Speciality Comments: No specialty comments available.  Procedures:  No procedures performed Allergies: Patient has no known allergies.   Assessment / Plan:     Visit Diagnoses: Psoriatic arthritis ([place]): He has no synovitis or dactylitis on exam.  He has PIP and DIP thickening on exam.  He has flexion contractures of all DIP joints.  He has not had any Achilles tendinitis or plantar fasciitis.  No SI joint tenderness on exam.  He has no active psoriasis at this time.  He is clinically doing well on Orencia 125 mg sq injections once weekly, Imuran 100 mg  daily, and prednisone 5 mg 1 tablet by mouth daily.  He will continue on the current treatment regimen.  He was advised to notify us if he develops increased joint pain or joint swelling.  Follow-up in the office in 5 months.  Psoriasis: He has no active psoriasis at this time.  Rheumatoid arthritis involving multiple sites with positive rheumatoid factor [facility]): He has no synovitis on exam today.  He is not had any recent rheumatoid arthritis flares.  He has synovial thickening of MCP joints as described above.  He experiences stiffness in both hands and both feet but has not had any active inflammation.  He is clinically doing well on Orencia 1 or 25 mg of days injections once weekly, Imuran 100 mg daily, and prednisone 5 mg a mouth daily.  He is not need any refills at this time.  He will continue on the current treatment regimen.  He was advised to notify us if he develops increased joint pain or joint swelling.  High risk medication use - Orencia 125 mg sq weekly injections (10/11/2016), imuran 100 mg po qd, prednisone 5 mg po daily ( Inadequate response to Enbrel, Humira, Cosyntex, Otezla ).  CBC and CMP were drawn on 05/27/2019.   Results were reviewed with the patient today in the office and all questions were addressed.  He will be due to update lab work in June and every 3 months to monitor for drug toxicity.  TB gold was negative on 05/27/2019.  We will continue to monitor TB Gold on a yearly basis.  He has not had any recent infections.  He has received both COVID-19 vaccinations.  Pulmonary fibrosis ([facility]) - Dr. Samuella Cota, NP: He has not been experiencing any increased shortness of breath or coughing recently.  He has wheezing intermittently.  He had PFTs performed on 06/10/2019 which were stable according to documentation by Geraldo Pitter, NP on 06/10/2019.  Patient was advised to follow-up with Dr. Elsworth Soho.  He will continue taking Imuran and prednisone as prescribed.  Osteopenia of multiple sites - Last DEXA on 10/18/019 showed T score of -1.6 at left femur neck  -5% change in BMD.  He is due to update DEXA in October 2021.    History of gastroesophageal reflux (GERD)  History of congestive heart failure  History of bronchiectasis  History of hyperlipidemia  History of coronary artery disease  History of atrial fibrillation  History of hypertension  Orders: No orders of the defined types were placed in this encounter.  No orders of the defined types were placed in this encounter.    Follow-Up Instructions: Return in about 5 months (around 11/18/2019) for Rheumatoid arthritis, Psoriatic arthritis.   Ofilia Neas, PA-C  Note - This record has been created using Dragon software.  Chart creation errors have been sought, but may not always  have been located. Such creation errors do not reflect on  the standard of medical care.

## 2019-06-09 ENCOUNTER — Ambulatory Visit: Payer: Medicare HMO | Attending: Internal Medicine

## 2019-06-09 DIAGNOSIS — Z23 Encounter for immunization: Secondary | ICD-10-CM

## 2019-06-09 NOTE — Progress Notes (Signed)
   Covid-19 Vaccination Clinic  Name:  Curtis Flores    MRN: [id number] DOB: 20-Jul-1955  06/09/2019  Curtis Flores was observed post Covid-19 immunization for 15 minutes without incident. He was provided with Vaccine Information Sheet and instruction to access the V-Safe system.   Curtis Flores was instructed to call 911 with any severe reactions post vaccine: [name] Difficulty breathing  . Swelling of face and throat  . A fast heartbeat  . A bad rash all over body  . Dizziness and weakness   Immunizations Administered    Name Date Dose VIS Date Route   Moderna COVID-19 Vaccine 06/09/2019  9:25 AM 0.5 mL 02/02/2019 Intramuscular   Manufacturer: Levan Hurst   LotUD:[id number]   ChalcoBE:[id number]

## 2019-06-10 ENCOUNTER — Ambulatory Visit (INDEPENDENT_AMBULATORY_CARE_PROVIDER_SITE_OTHER): Payer: Medicare HMO | Admitting: Pulmonary Disease

## 2019-06-10 ENCOUNTER — Other Ambulatory Visit: Payer: Self-pay

## 2019-06-10 DIAGNOSIS — J8410 Pulmonary fibrosis, unspecified: Secondary | ICD-10-CM

## 2019-06-10 NOTE — Progress Notes (Signed)
Spiro/DLCO performed today. 

## 2019-06-11 LAB — PULMONARY FUNCTION TEST
DL/VA % pred: 90 %
DL/VA: 3.76 ml/min/mmHg/L
DLCO cor % pred: 48 %
DLCO cor: 11.86 ml/min/mmHg
DLCO unc % pred: 48 %
DLCO unc: 11.82 ml/min/mmHg
FEF 25-75 Pre: 1.61 L/sec
FEF2575-%Pred-Pre: 67 %
FEV1-%Pred-Pre: 58 %
FEV1-Pre: 1.54 L
FEV1FVC-%Pred-Pre: 108 %
FEV6-%Pred-Pre: 55 %
FEV6-Pre: 1.84 L
FEV6FVC-%Pred-Pre: 104 %
FVC-%Pred-Pre: 53 %
FVC-Pre: 1.84 L
Pre FEV1/FVC ratio: 83 %
Pre FEV6/FVC Ratio: 100 %

## 2019-06-11 NOTE — Progress Notes (Signed)
Please let patient know Spirometry and DLCO are stable. Please have patient scheduled a follow up visit with Dr. Elsworth Soho

## 2019-06-15 NOTE — Telephone Encounter (Signed)
Received fax from [place], patient's application has been APPROVED. Coverage dates are 03/05/19 to 03/03/20.   Will send document to scan center, once received.   Phone# [phone] Fax# [phone]  For refills: [facility] Phone# [phone]  10:09 AM Beatriz Chancellor, CPhT

## 2019-06-18 ENCOUNTER — Ambulatory Visit (INDEPENDENT_AMBULATORY_CARE_PROVIDER_SITE_OTHER): Payer: Medicare HMO | Admitting: Physician Assistant

## 2019-06-18 ENCOUNTER — Encounter: Payer: Self-pay | Admitting: Physician Assistant

## 2019-06-18 ENCOUNTER — Other Ambulatory Visit: Payer: Self-pay

## 2019-06-18 VITALS — BP 146/59 | HR 48 | Resp 18 | Ht 67.0 in | Wt 205.8 lb

## 2019-06-18 DIAGNOSIS — Z79899 Other long term (current) drug therapy: Secondary | ICD-10-CM

## 2019-06-18 DIAGNOSIS — J8410 Pulmonary fibrosis, unspecified: Secondary | ICD-10-CM

## 2019-06-18 DIAGNOSIS — M0579 Rheumatoid arthritis with rheumatoid factor of multiple sites without organ or systems involvement: Secondary | ICD-10-CM | POA: Diagnosis not present

## 2019-06-18 DIAGNOSIS — Z8709 Personal history of other diseases of the respiratory system: Secondary | ICD-10-CM

## 2019-06-18 DIAGNOSIS — M8589 Other specified disorders of bone density and structure, multiple sites: Secondary | ICD-10-CM

## 2019-06-18 DIAGNOSIS — L4050 Arthropathic psoriasis, unspecified: Secondary | ICD-10-CM

## 2019-06-18 DIAGNOSIS — Z8639 Personal history of other endocrine, nutritional and metabolic disease: Secondary | ICD-10-CM

## 2019-06-18 DIAGNOSIS — L409 Psoriasis, unspecified: Secondary | ICD-10-CM

## 2019-06-18 DIAGNOSIS — Z8679 Personal history of other diseases of the circulatory system: Secondary | ICD-10-CM

## 2019-06-18 DIAGNOSIS — Z8719 Personal history of other diseases of the digestive system: Secondary | ICD-10-CM

## 2019-06-18 NOTE — Patient Instructions (Signed)
Standing Labs We placed an order today for your standing lab work.    Please come back and get your standing labs in June and every 3 months.   We have open lab daily Monday through Thursday from 8:30-12:30 PM and 1:30-4:30 PM and Friday from 8:30-12:30 PM and 1:30-4:00 PM at the office of Dr. Shaili Deveshwar.   You may experience shorter wait times on Monday and Friday afternoons. The office is located at [address], [address], [place], [place] [address] No appointment is necessary.   Labs are drawn by [facility].  You may receive a bill from [facility] for your lab work.  If you wish to have your labs drawn at another location, please call the office 24 hours in advance to send orders.  If you have any questions regarding directions or hours of operation,  please call [phone].   Just as a reminder please drink plenty of water prior to coming for your lab work. Thanks!  

## 2019-06-25 NOTE — Progress Notes (Signed)
ATC, NA and no option to leave msg 

## 2019-07-07 ENCOUNTER — Other Ambulatory Visit: Payer: Self-pay | Admitting: Rheumatology

## 2019-07-07 ENCOUNTER — Other Ambulatory Visit: Payer: Self-pay | Admitting: Cardiovascular Disease

## 2019-07-07 ENCOUNTER — Telehealth: Payer: Self-pay | Admitting: Cardiovascular Disease

## 2019-07-07 MED ORDER — FUROSEMIDE 40 MG PO TABS: 40.0000 mg | ORAL_TABLET | Freq: Two times a day (BID) | ORAL | 2 refills | Status: DC

## 2019-07-07 MED ORDER — LOSARTAN POTASSIUM 100 MG PO TABS: 100.0000 mg | ORAL_TABLET | Freq: Every day | ORAL | 2 refills | Status: DC

## 2019-07-07 MED ORDER — FUROSEMIDE 40 MG PO TABS: 40.0000 mg | ORAL_TABLET | Freq: Two times a day (BID) | ORAL | 0 refills | Status: DC

## 2019-07-07 MED ORDER — LOSARTAN POTASSIUM 100 MG PO TABS: 100.0000 mg | ORAL_TABLET | Freq: Every day | ORAL | 0 refills | Status: DC

## 2019-07-07 NOTE — Telephone Encounter (Signed)
New Message  Pt c/o medication issue:  1. Name of Medication: furosemide (LASIX) 40 MG tablet,  losartan (COZAAR) 100 MG tablet  2. How are you currently taking this medication (dosage and times per day)? As written  3. Are you having a reaction (difficulty breathing--STAT)? No  4. What is your medication issue? Patient is currently out of medication. Needs a new prescription for both. Please send prescriptions to [facility]. Needs medication ASAP. W[facility] to know [facility] since it takes [facility] for it to come through the mail

## 2019-07-07 NOTE — Telephone Encounter (Signed)
Patient's wife advised the prescription for Prednisone is not due to be refilled yet. Patient advised it will be due to be refilled in June 2021.

## 2019-07-07 NOTE — Telephone Encounter (Signed)
Curtis Flores called requesting prescription refill of Prednisone 5 mg tablets to be sent to [facility].

## 2019-07-07 NOTE — Telephone Encounter (Signed)
Returned call to wife (ok per DPR)-advised sent to [facility] and [facility]   Wife aware and verbalized understanding.

## 2019-07-07 NOTE — Progress Notes (Signed)
Patient identification verified. Results of recent spirometry and DLCO reviewed. Per Derl Barrow NP, test are stable. Please follow up with Dr. Elsworth Soho. Appointment made for June. Patient verbalized understanding of results.

## 2019-07-17 ENCOUNTER — Other Ambulatory Visit: Payer: Self-pay | Admitting: Physician Assistant

## 2019-07-17 DIAGNOSIS — D508 Other iron deficiency anemias: Secondary | ICD-10-CM

## 2019-07-19 NOTE — Telephone Encounter (Signed)
Last Visit: 06/18/2019 Next Visit: 11/19/2019 Labs: 05/27/2019 Labs are stable. WBC count is high because patient is on chronic prednisone .  Current Dose per office note 05/27/2019: Imuran 100 mg daily  Okay to refill per Dr. Estanislado Pandy

## 2019-07-21 ENCOUNTER — Other Ambulatory Visit: Payer: Self-pay | Admitting: Cardiovascular Disease

## 2019-07-21 DIAGNOSIS — D508 Other iron deficiency anemias: Secondary | ICD-10-CM

## 2019-07-26 ENCOUNTER — Other Ambulatory Visit: Payer: Self-pay | Admitting: Cardiovascular Disease

## 2019-08-06 ENCOUNTER — Other Ambulatory Visit: Payer: Self-pay | Admitting: Rheumatology

## 2019-08-06 ENCOUNTER — Other Ambulatory Visit: Payer: Self-pay | Admitting: Cardiovascular Disease

## 2019-08-09 NOTE — Telephone Encounter (Signed)
Last Visit: 06/18/2019 Next Visit: 11/19/2019 Labs: 05/27/2019 Labs are stable. WBC count is high because patient is on chronic prednisone . TB Gold: 05/27/2019 Neg   Left message to advise patient he is due to update labs this month.  Okay to refill per Dr. Estanislado Pandy

## 2019-08-18 ENCOUNTER — Telehealth: Payer: Self-pay | Admitting: Rheumatology

## 2019-08-18 DIAGNOSIS — Z79899 Other long term (current) drug therapy: Secondary | ICD-10-CM

## 2019-08-18 NOTE — Telephone Encounter (Signed)
Lab Orders faxed

## 2019-08-18 NOTE — Telephone Encounter (Signed)
Curtis Flores called requesting labwork order be sent to [facility] for [name].  Curtis Flores states they will be going on Friday, 08/27/19. Curtis Flores is requesting the orders be faxed to [phone] and include [name] name and date of birth and they will put it in his file until he gets there next week.

## 2019-08-19 ENCOUNTER — Ambulatory Visit: Payer: Medicare HMO | Admitting: Pulmonary Disease

## 2019-08-23 ENCOUNTER — Other Ambulatory Visit: Payer: Self-pay | Admitting: Rheumatology

## 2019-08-23 NOTE — Telephone Encounter (Addendum)
Last Visit:06/18/2019 Next Visit:11/19/2019  Current Dose per office note on 06/18/2019: prednisone 5 mg po daily  Okay to refill per Dr. Estanislado Pandy

## 2019-08-25 ENCOUNTER — Other Ambulatory Visit: Payer: Self-pay | Admitting: *Deleted

## 2019-08-25 DIAGNOSIS — D508 Other iron deficiency anemias: Secondary | ICD-10-CM

## 2019-08-25 MED ORDER — AZATHIOPRINE 50 MG PO TABS: 50.0000 mg | ORAL_TABLET | Freq: Two times a day (BID) | ORAL | 2 refills | Status: DC

## 2019-08-25 NOTE — Telephone Encounter (Signed)
Refill request received via fax  Last Visit:06/18/2019 Next Visit:11/19/2019 Labs:3/25/2021Labs are stable. WBC count is high because patient is on chronic prednisone .  Current Dose per office note on 06/18/2019: Imuran 100 mg daily

## 2019-08-27 ENCOUNTER — Encounter: Payer: Self-pay | Admitting: Pulmonary Disease

## 2019-08-27 ENCOUNTER — Ambulatory Visit (INDEPENDENT_AMBULATORY_CARE_PROVIDER_SITE_OTHER): Payer: Medicare HMO | Admitting: Pulmonary Disease

## 2019-08-27 ENCOUNTER — Other Ambulatory Visit (HOSPITAL_COMMUNITY)
Admission: RE | Admit: 2019-08-27 | Discharge: 2019-08-27 | Disposition: A | Discharge status: Home / Self Care | Payer: Medicare HMO | Source: Ambulatory Visit | Attending: Rheumatology | Admitting: Rheumatology

## 2019-08-27 ENCOUNTER — Other Ambulatory Visit: Payer: Self-pay

## 2019-08-27 DIAGNOSIS — Z79899 Other long term (current) drug therapy: Secondary | ICD-10-CM | POA: Insufficient documentation

## 2019-08-27 DIAGNOSIS — M05731 Rheumatoid arthritis with rheumatoid factor of right wrist without organ or systems involvement: Secondary | ICD-10-CM | POA: Diagnosis not present

## 2019-08-27 DIAGNOSIS — J8410 Pulmonary fibrosis, unspecified: Secondary | ICD-10-CM | POA: Diagnosis not present

## 2019-08-27 DIAGNOSIS — J849 Interstitial pulmonary disease, unspecified: Secondary | ICD-10-CM | POA: Diagnosis not present

## 2019-08-27 DIAGNOSIS — M05732 Rheumatoid arthritis with rheumatoid factor of left wrist without organ or systems involvement: Secondary | ICD-10-CM

## 2019-08-27 LAB — COMPREHENSIVE METABOLIC PANEL
ALT: 27 U/L (ref 0–44)
AST: 34 U/L (ref 15–41)
Albumin: 3.9 g/dL (ref 3.5–5.0)
Alkaline Phosphatase: 66 U/L (ref 38–126)
Anion gap: 10 (ref 5–15)
BUN: 10 mg/dL (ref 8–23)
CO2: 28 mmol/L (ref 22–32)
Calcium: 9.2 mg/dL (ref 8.9–10.3)
Chloride: 100 mmol/L (ref 98–111)
Creatinine, Ser: 0.72 mg/dL (ref 0.61–1.24)
GFR calc Af Amer: >60 mL/min (ref >60)
GFR calc non Af Amer: >60 mL/min (ref >60)
Glucose, Bld: 101 mg/dL — ABNORMAL HIGH (ref 70–99)
Potassium: 4.1 mmol/L (ref 3.5–5.1)
Sodium: 138 mmol/L (ref 135–145)
Total Bilirubin: 0.6 mg/dL (ref 0.3–1.2)
Total Protein: 7.5 g/dL (ref 6.5–8.1)

## 2019-08-27 LAB — CBC WITH DIFFERENTIAL/PLATELET
Abs Immature Granulocytes: 0.05 10*3/uL (ref 0.00–0.07)
Basophils Absolute: 0.1 10*3/uL (ref 0.0–0.1)
Basophils Relative: 1 %
Eosinophils Absolute: 0.1 10*3/uL (ref 0.0–0.5)
Eosinophils Relative: 1 %
HCT: 46.9 % (ref 39.0–52.0)
Hemoglobin: 15 g/dL (ref 13.0–17.0)
Immature Granulocytes: 0 %
Lymphocytes Relative: 20 %
Lymphs Abs: 2.9 10*3/uL (ref 0.7–4.0)
MCH: 28.5 pg (ref 26.0–34.0)
MCHC: 32 g/dL (ref 30.0–36.0)
MCV: 89.2 fL (ref 80.0–100.0)
Monocytes Absolute: 0.9 10*3/uL (ref 0.1–1.0)
Monocytes Relative: 6 %
Neutro Abs: 10.5 10*3/uL — ABNORMAL HIGH (ref 1.7–7.7)
Neutrophils Relative %: 72 %
Platelets: 230 10*3/uL (ref 150–400)
RBC: 5.26 MIL/uL (ref 4.22–5.81)
RDW: 13.9 % (ref 11.5–15.5)
WBC: 14.6 10*3/uL — ABNORMAL HIGH (ref 4.0–10.5)
nRBC: 0 % (ref 0.0–0.2)

## 2019-08-27 NOTE — Assessment & Plan Note (Signed)
FVC seems to have dropped in 2020 after pneumonia but has stabilized since. DLCO has remained stable in the 48% range since2017  Overall stable clinically. Will assess by high-resolution CT chest in 4 months

## 2019-08-27 NOTE — Progress Notes (Signed)
   Subjective:    Patient ID: Curtis Flores, male    DOB: [birth year], 64 y.o.   MRN: [id number]  HPI  64 yo ex smoker for FU of ILD - favor NSIP  PMH - psoriasis since 2001, rheumatoid arthritis  2012  Seen by Dr Lamonte Sakai for persistent leucocytosis, bone marrow biopsy neg A Flutter -s/pablation and amiodarone wasstopped., on eliquis   Chief Complaint  Patient presents with  . Follow-up    Patient is feeling good overall no issues or concerns. Dry cough   He continues on Orencia, 5 mg of prednisone and Imuran 100 mg daily. Has been vaccinated and dealing well with the pandemic.  Uses mask, no intercurrent infections or bronchitis episodes. Arthritis is well controlled on above medications. Breathing is stable  Significant tests/ events reviewed  2012 -presented with a symmetric polyarthritis of elbows, wrists, hands, neck, shoulders &feet  CXR at [facility] showed bibasal interstitial fibrosis with hilar lymphadenopathy With POS RA 89 & CCP 122, ANA neg, HLA B 27 neg, CK 87  PPD neg, hep panel neg  HRCT 05/2015- extensive regions of ground-glass attenuation and most pronounced in the lower lobes of the lungs bilaterally. These areas demonstrate internal interstitial prominence with a large amount of microcystic honeycombingwith some cylindrical bronchiectasis and bronchiolectasis. moderate hiatal hernia was also noted.   PFTs 2013 showed intraprenchymal restricton with FVC around 71% &DLCO 56% c/w ILD, no airway obstruction.  Rpt PFTs 10/2012 show mild drop in FVC to 66%, DLCO preserved   PFT 05/2015 FEV1 70%, ratio 82, FVC 66%, no BD response, DLCO 46%. DLCO has decreased from 2014.  CT chest 05/12/15 showed ILD/NSIP , slight progression compared from 2013.   PFT 12/2015 >> FVC 71%, DLCO 45% unc  PFTs 03/2018 show drop in FVC to 53%, TLC to 49%, DLCO maintained at 41%   PFTs 06/2019 moderate restriction, ratio 83, FVC 53%, DLCO stable at 11.8/28%  Review of  Systems Patient denies significant dyspnea,cough, hemoptysis,  chest pain, palpitations, pedal edema, orthopnea, paroxysmal nocturnal dyspnea, lightheadedness, nausea, vomiting, abdominal or  leg pains      Objective:   Physical Exam  Gen. Pleasant, well-nourished, in no distress ENT - no thrush, no pallor/icterus,no post nasal drip Neck: No JVD, no thyromegaly, no carotid bruits Lungs: no use of accessory muscles, no dullness to percussion, RLL  rales no rhonchi  Cardiovascular: Rhythm regular, heart sounds  normal, no murmurs or gallops, no peripheral edema Musculoskeletal: No deformities, no cyanosis or clubbing        Assessment & Plan:

## 2019-08-27 NOTE — Progress Notes (Signed)
Labs are stable.

## 2019-08-27 NOTE — Patient Instructions (Signed)
High res CT chest in October  Fibrosis appears stable compared to 2020

## 2019-08-27 NOTE — Assessment & Plan Note (Signed)
Appears controlled on current dose of Imuran and low-dose prednisone

## 2019-09-09 ENCOUNTER — Other Ambulatory Visit: Payer: Self-pay | Admitting: Rheumatology

## 2019-09-10 NOTE — Telephone Encounter (Signed)
Last Visit:06/18/2019 Next Visit:11/19/2019 Labs:08/27/2019 stable TB Gold: 05/27/2019 Neg   Current Dose per office note on 06/18/2019:  Orencia 125 mg sq injections once weekly  Okay to refill per Dr. Estanislado Pandy

## 2019-09-30 ENCOUNTER — Telehealth: Payer: Self-pay | Admitting: Rheumatology

## 2019-09-30 MED ORDER — PREDNISONE 5 MG PO TABS: ORAL_TABLET | ORAL | 0 refills | Status: DC

## 2019-09-30 NOTE — Telephone Encounter (Signed)
Last Visit:06/18/2019 Next Visit:11/19/2019  Current Dose per office note on 06/18/2019: prednisone 5 mg po daily  Okay to refill per Dr. Estanislado Pandy

## 2019-09-30 NOTE — Telephone Encounter (Signed)
Curtis Flores from [facility] left a voicemail stating she is following up on prescription refill request of Prednisone 5 mg for the patient.   Please phone or fax the prescription so we can send to the patient.  Phone: [phone]  Fax [phone]

## 2019-10-08 ENCOUNTER — Telehealth: Payer: Self-pay | Admitting: Cardiovascular Disease

## 2019-10-08 NOTE — Telephone Encounter (Signed)
Patiens Wife Curtis Flores)- who is on DPR called in regarding patients Elliquis prescription. She states that the prescription would be 400$ and they can not afford that at this time, and he took his last elliquis last night. This nurse printed patient assistance forms for elliquis and acquired 2 boxes of samples to try and get him through until the forms can be completed. This nurse also contacted patients local pharmacy and asked if needed could a two week supply be picked up if they run out of samples before forms were completed and [facility] [place] [facility] that [facility] could [facility] that if needed. This nurse made patient and his wife aware of this. Patients wife stated that their Son would be able to come and pick up the samples and forms today to bring to the patient.Patients wife notified that the forms needed to be completed by the patient and returned back to the office. Patient's wife verbalized understanding.   Samples of Eliquis 5mg  were given to the patient, quantity 2 Boxes (28 tablets), Lot Number [id number] Exp: 8/23  Samples and forms left at front office for pick up. Patients wife made aware.

## 2019-10-08 NOTE — Telephone Encounter (Signed)
Follow up     Patient's wife called back to talk to the nurse again.  She had something else to tell her but would not tell me.  Please call

## 2019-10-08 NOTE — Telephone Encounter (Signed)
Pt c/o medication issue:  1. Name of Medication: apixaban (ELIQUIS) 5 MG TABS tablet  2. How are you currently taking this medication (dosage and times per day)? 1 tablet twice a day  3. Are you having a reaction (difficulty breathing--STAT)? no  4. What is your medication issue? Patient's wife states the patient is completely out of the medication and [facility] is charging $400 for it. She would like to know if there is a coupon to get the medication for cheaper.

## 2019-10-20 ENCOUNTER — Other Ambulatory Visit: Payer: Self-pay | Admitting: Rheumatology

## 2019-10-20 ENCOUNTER — Telehealth: Payer: Self-pay

## 2019-10-20 DIAGNOSIS — D508 Other iron deficiency anemias: Secondary | ICD-10-CM

## 2019-10-20 NOTE — Telephone Encounter (Signed)
Received paperwork for patient assistance Curtis Flores- for Eliquis. Filled out and completed forms faxed over with Dr.Berry's signature today 10/20/19 at 3:45 PM.  Will place on Dr.Berry's cart and notify covering nurse that it is there and completed in case resend is needed.  Thanks!

## 2019-10-21 NOTE — Telephone Encounter (Signed)
Last Visit:06/18/2019 Next Visit:11/19/2019 Labs:08/27/2019 stable  Current Dose per office note on 06/18/2019: imuran 100 mg po qd  Okay to refill per Dr. Estanislado Pandy

## 2019-11-05 NOTE — Progress Notes (Signed)
Office Visit Note  Patient: Curtis Flores             Date of Birth: 04-May-1955           MRN: [id number]             PCP: Chesley Noon, MD Referring: Chesley Noon, MD Visit Date: 11/19/2019 Occupation: @GUAROCC @  Subjective:  Psoriasis   History of Present Illness: Curtis Flores is a 64 y.o. male with history of seropositive rheumatoid arthritis and psoriatic arthritis overlap.  He is currently on Orencia 125 mg sq injections once weekly, Imuran 50 mg 2 tablets daily, and Prednisone 5 mg daily.  He has not missed any doses of Orencia, Imuran, or prednisone recently.  He denies any recent flares.  He is not having any joint pain or joint swelling at this time.  He denies any morning stiffness or nocturnal pain.  He has no difficulty with ADLs.  He denies any new concerns at this time. He has not had any recent infections and has received both COVID-19 vaccinations.  Activities of Daily Living:  Patient reports morning stiffness for 0  minutes.   Patient Denies nocturnal pain.  Difficulty dressing/grooming: Denies Difficulty climbing stairs: Denies Difficulty getting out of chair: Denies Difficulty using hands for taps, buttons, cutlery, and/or writing: Denies  Review of Systems  Constitutional: Negative for fatigue and night sweats.  HENT: Negative for mouth sores, mouth dryness and nose dryness.   Eyes: Negative for redness, itching and dryness.  Respiratory: Negative for cough, hemoptysis, shortness of breath and difficulty breathing.   Cardiovascular: Negative for chest pain, palpitations, hypertension, irregular heartbeat and swelling in legs/feet.  Gastrointestinal: Negative for blood in stool, constipation and diarrhea.  Endocrine: Negative for increased urination.  Genitourinary: Negative for painful urination.  Musculoskeletal: Negative for arthralgias, joint pain, joint swelling, myalgias, muscle weakness, morning stiffness, muscle tenderness and myalgias.    Skin: Negative for color change, rash, hair loss, nodules/bumps, skin tightness, ulcers and sensitivity to sunlight.  Allergic/Immunologic: Negative for susceptible to infections.  Neurological: Negative for dizziness, fainting, memory loss, night sweats and weakness.  Hematological: Positive for bruising/bleeding tendency (On Eliquis). Negative for swollen glands.  Psychiatric/Behavioral: Negative for depressed mood, confusion and sleep disturbance. The patient is not nervous/anxious.     PMFS History:  Patient Active Problem List   Diagnosis Date Noted  . Atrial fibrillation with RVR ([place]) 05/28/2018  . Chronic anticoagulation 05/28/2018  . NSTEMI (non-ST elevated myocardial infarction) ([place]) 05/09/2018  . Sepsis ([place]) 01/31/2018  . Ischemic cardiomyopathy 12/04/2016  . Leukocytosis 07/17/2016  . Pain in joint involving multiple sites 04/25/2016  . Psoriatic arthritis ([place]) 02/14/2016  . High risk medication use 02/14/2016  . NICM (nonischemic cardiomyopathy) ([place]) 08/29/2015  . Essential hypertension 08/29/2015  . Abnormal CT scan, stomach   . Hiatal hernia   . GERD (gastroesophageal reflux disease) 08/01/2015  . Weight loss, unintentional 08/01/2015  . Iron deficiency anemia 06/06/2015  . Heme positive stool 06/06/2015  . PAF (paroxysmal atrial fibrillation) ([place]) 05/30/2015  . CAD S/P percutaneous coronary angioplasty 04/07/2015  . 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 Acute on chronic systolic congestive heart failure ([place]) 04/07/2015  . LBBB (left bundle branch block) 04/07/2015  . Wide-complex tachycardia ([place]) 04/07/2015  . Dyslipidemia, goal LDL below 70 03/07/2015  . Psoriasis   . Rheumatoid arthritis ([place])   . Pulmonary fibrosis ([place]) 03/21/2011    Past Medical History:  Diagnosis Date  .  Abnormal CT scan, stomach   . Chronic systolic dysfunction of left ventricle   . Coronary artery disease    lateral STEMI 02/22/15 3/10 cutting balloon to ISR  of the o/pLCX  . GERD (gastroesophageal reflux disease)   . Hyperlipidemia   . Hypertension   . Ischemic cardiomyopathy   . LBBB (left bundle branch block)   . Leukocytosis    followed by hematology, reactive  . Lymphadenopathy   . Myocardial infarction ([place]) 02/2015  . Psoriasis 2003  . Psoriatic arthritis ([place])   . Pulmonary fibrosis ([place])   . Rheumatoid arthritis(714.0) 2012  . Typical atrial flutter ([facility])     Family History  Problem Relation Age of Onset  . Hypertension Mother   . Colon cancer Neg Hx    Past Surgical History:  Procedure Laterality Date  . CARDIAC CATHETERIZATION N/A 02/22/2015   Procedure: Left Heart Cath and Coronary Angiography;  Surgeon: Lorretta Harp, MD;  Location: [facility];  Service: Cardiovascular;  Laterality: N/A;  . CARDIAC CATHETERIZATION N/A 02/22/2015   Procedure: Coronary Stent Intervention;  Surgeon: Lorretta Harp, MD;  Location: [facility];  Service: Cardiovascular;  Laterality: N/A;  . CARDIOVERSION N/A 04/12/2015   Procedure: CARDIOVERSION;  Surgeon: Larey Dresser, MD;  [facility] ENDOSCOPY;  Service: Cardiovascular;  Laterality: N/A;  . COLONOSCOPY  07/01/2003   EGB:TDVVOH colonic mucosa except for the proximal right colon in the area of ICV which was not seen completely due to inadequate bowel prep. followed with ACBE which was normal.   . COLONOSCOPY N/A 08/24/2015   Dr. Gala Romney: Normal colon. Next colonoscopy in 10 years.  . CORONARY BALLOON ANGIOPLASTY N/A 05/12/2018   Procedure: CORONARY BALLOON ANGIOPLASTY;  Surgeon: Belva Crome, MD;  Location: [place] [facility];  Service: Cardiovascular;  Laterality: N/A;  . ELECTROPHYSIOLOGIC STUDY N/A 05/30/2015   Atrial fibrillation ablation by Dr Rayann Heman  . ESOPHAGOGASTRODUODENOSCOPY N/A 08/24/2015   Dr. Gala Romney: Medium-sized hiatal hernia, erosive gastropathy. Cameron lesions. Esophageal mucosa distally suggestive of short segment Barrett's esophagus. Not confirmed on  biopsy. Gastric biopsy with minimal chronic inflammation  . GIVENS CAPSULE STUDY N/A 04/17/2016   Procedure: GIVENS CAPSULE STUDY;  Surgeon: Daneil Dolin, MD;  Location[facility];  Service: Endoscopy;  Laterality: N/A;  Pt to arrive at 8:00 am for 8:30 am appt  . LEFT HEART CATH AND CORONARY ANGIOGRAPHY N/A 05/11/2018   Procedure: LEFT HEART CATH AND CORONARY ANGIOGRAPHY;  Surgeon: Belva Crome, MD;  Location: [facility];  Service: Cardiovascular;  Laterality: N/A;  . TEE WITHOUT CARDIOVERSION N/A 04/12/2015   Procedure: TRANSESOPHAGEAL ECHOCARDIOGRAM (TEE);  Surgeon: Larey Dresser, MD;  Location: [facility] ENDOSCOPY;  Service: Cardiovascular;  Laterality: N/A;   Social History   Social History Narrative  . Not on file   Immunization History  Administered Date(s) Administered  . Fluad Quad(high Dose 65+) 12/09/2018  . Influenza Split 12/22/2010  . Influenza Whole 01/18/2010  . Influenza, High Dose Seasonal PF 11/11/2017, 12/02/2018  . Influenza, Seasonal, Injecte, Preservative Fre 12/03/2014, 12/13/2015, 12/03/2017  . Influenza-Unspecified 12/03/2014, 12/13/2015, 11/12/2016  . Moderna SARS-COVID-2 Vaccination 05/08/2019, 06/09/2019  . Pneumococcal Conjugate-13 08/20/2016, 04/07/2018  . Pneumococcal Polysaccharide-23 01/22/2011, 03/05/2011  . Pneumococcal-Unspecified 01/22/2011, 03/05/2011     Objective: Vital Signs: BP (!) 166/77 (BP Location: Left Arm, Patient Position: Sitting, Cuff Size: Normal)   Pulse (!) 43   Resp 16   Ht 5\' 7"  (1.702 m)   Wt 190  lb 9.6 oz (86.5 kg)   BMI 29.85 kg/m    Physical Exam Vitals and nursing note reviewed.  Constitutional:      Appearance: He is well-developed.  HENT:     Head: Normocephalic and atraumatic.  Eyes:     Conjunctiva/sclera: Conjunctivae normal.     Pupils: Pupils are equal, round, and reactive to light.  Pulmonary:     Effort: Pulmonary effort is normal.  Abdominal:     General: Bowel sounds are normal.      Palpations: Abdomen is soft.  Musculoskeletal:     Cervical back: Normal range of motion and neck supple.  Skin:    General: Skin is warm and dry.     Capillary Refill: Capillary refill takes less than 2 seconds.     Comments: Psoriasis on scalp   Neurological:     Mental Status: He is alert and oriented to person, place, and time.  Psychiatric:        Behavior: Behavior normal.      Musculoskeletal Exam:  C-spine, thoracic spine, and lumbar spine good ROM.  No midline spinal tenderness.  No SI joint tenderness.  Shoulder joints, elbow joints, wrist joints, MCPs, PIPs, and DIPs good ROM with no synovitis. Synovial thickening of both wrist joints noted but no tenderness or active synovitis noted. Thickening of all right MCPs and left 1st, 2nd, and 3rd MCPs.  Complete fist formation bilaterally.  PIP and DIP thickening noted.  Hip joints have good ROM with no discomfort.  Knee joints good ROM with no warmth or effusion.  Ankle joints good ROM with no tenderness or inflammation.  No tenderness over achilles or plantar fascia.   CDAI Exam: CDAI Score: 0  Patient Global: 0 mm; Provider Global: 0 mm Swollen: 0 ; Tender: 0  Joint Exam 11/19/2019   No joint exam has been documented for this visit   There is currently no information documented on the homunculus. Go to the Rheumatology activity and complete the homunculus joint exam.  Investigation: No additional findings.  Imaging: No results found.  Recent Labs: Lab Results  Component Value Date   WBC 14.6 (H) 08/27/2019   HGB 15.0 08/27/2019   PLT 230 08/27/2019   NA 138 08/27/2019   K 4.1 08/27/2019   CL 100 08/27/2019   CO2 28 08/27/2019   GLUCOSE 101 (H) 08/27/2019   BUN 10 08/27/2019   CREATININE 0.72 08/27/2019   BILITOT 0.6 08/27/2019   ALKPHOS 66 08/27/2019   AST 34 08/27/2019   ALT 27 08/27/2019   PROT 7.5 08/27/2019   ALBUMIN 3.9 08/27/2019   CALCIUM 9.2 08/27/2019   GFRAA >60 08/27/2019   QFTBGOLDPLUS NEGATIVE  05/27/2019    Speciality Comments: No specialty comments available.  Procedures:  No procedures performed Allergies: Patient has no known allergies.   Assessment / Plan:     Visit Diagnoses: Psoriatic arthritis ([place]): He has no synovitis or dactylitis on exam.  He has not had any recent psoriatic arthritis flares.  He is not experiencing any joint pain, joint swelling, morning stiffness, or nocturnal pain.  He has no difficulty with ADLs.  He has not had any Achilles tendinitis or plantar fasciitis.  He has no SI joint tenderness on exam.  He is clinically doing well on Orencia 125 mg subcutaneous injections once weekly, Imuran 100 mg mouth daily, and prednisone 5 mg daily.  He has not missed any doses of these medications recently and has not had any recent infections.  He experiences intermittent flares of psoriasis on his scalp and uses clobetasol cream topically.  He requested a refill which was sent to the pharmacy today.  Refill of Orencia was also sent.  He will continue on the current treatment regimen.  He was advised to notify us if he develops increased joint pain or joint swelling.  He will follow-up in the office in 5 months.  Rheumatoid arthritis involving multiple sites with positive rheumatoid factor ([place]): He has no synovitis on exam.  He has not had any recent rheumatoid arthritis flares.  He is not experiencing any joint pain or joint swelling at this time.  He has no morning stiffness or nocturnal pain.  He will continue on Orencia 125 mg subcutaneous injections once weekly, Imuran 100 mg daily, and prednisone 5 mg by mouth daily.  Psoriasis: He has intermittent flares of scalp psoriasis.  A refill of clobetasol cream was sent to the pharmacy today.  He will continue on the current treatment regimen.  High risk medication use - Orencia 125 mg sq weekly injections (10/11/2016), imuran 100 mg po qd, prednisone 5 mg po daily ( Inadequate response to Enbrel, Humira, Cosyntex, Otezla  ).  CBC and CMP were drawn on 10/15/19.  White blood cell count was 12.7 likely due to long-term prednisone use.  Creatinine was borderline low and GFR was within normal limits.  He will be due to update lab work in December and every 3 months to monitor for drug toxicity.  Standing orders for CBC and CMP were placed today.  TB gold was negative on 05/27/2019 and will continue to be monitored yearly. - Plan: CBC with Differential/Platelet, COMPLETE METABOLIC PANEL WITH GFR  He has received both COVID-19 vaccinations and was encouraged to receive a third dose.  He was advised to notify us or his PCP if he develops a COVID-19 infection in order to receive the antibody infusion.  He was encouraged to continue to wear a mask and social distance.  He voiced understanding and all questions were addressed.   Pulmonary fibrosis (HCC) - Dr. Samuella Cota, NP: Patient was last seen by Dr. Elsworth Soho on 08/27/2019.  According to his office visit note: FVC  dropped in 2020 after pneumonia but has stabilized since.  DLCO has remained stable in the 48/% range since 2017.  Overall he is clinically stable. He is not experiencing a cough, shortness of breath, or difficulty breathing. He has a high-resolution chest CT scheduled on 12/21/2019.  Osteopenia of multiple sites - Last DEXA on 12/19/17 showed T score of -1.6 at left femur neck  -5% change in BMD.  He is due to update DEXA in October 2021.  He has not had any recent falls or fractures.  He is on long-term prednisone 5 mg daily.  Order for DEXA was placed today.  - Plan: DG BONE DENSITY (DXA)  Other medical conditions are listed as follows:   History of gastroesophageal reflux (GERD)  History of congestive heart failure  History of bronchiectasis  History of hyperlipidemia  History of coronary artery disease  History of atrial fibrillation  History of hypertension  Orders: Orders Placed This Encounter  Procedures  . DG BONE DENSITY (DXA)  . CBC with  Differential/Platelet  . COMPLETE METABOLIC PANEL WITH GFR   Meds ordered this encounter  Medications  . Abatacept (ORENCIA CLICKJECT) 643 MG/ML SOAJ    Sig: INJECT 125MG  (1 SYRINGE) SUBCUTANEOUSLY ONCE A WEEK.  (STORE IN REFRIGERATOR IMMEDIATELY.)    Dispense:  12 mL    Refill:  0      Follow-Up Instructions: Return in about 5 months (around 04/20/2020) for Rheumatoid arthritis, Psoriatic arthritis.   Ofilia Neas, PA-C  Note - This record has been created using Dragon software.  Chart creation errors have been sought, but may not always  have been located. Such creation errors do not reflect on  the standard of medical care.

## 2019-11-17 ENCOUNTER — Telehealth: Payer: Self-pay | Admitting: Cardiovascular Disease

## 2019-11-17 NOTE — Telephone Encounter (Signed)
No samples available today but will hold in triage if we get some in prior to Friday.

## 2019-11-17 NOTE — Telephone Encounter (Signed)
New Message   Patient calling the office for samples of medication:   1.  What medication and dosage are you requesting samples for? apixaban (ELIQUIS) 5 MG TABS tablet  2.  Are you currently out of this medication? Pt has enough until Sunday   Pt would like to pick up Samples Friday   Please advise

## 2019-11-18 NOTE — Telephone Encounter (Signed)
Samples placed at the front desk for pick up. Wife verbalized understanding.   Eliquis 5 mg Qty: 1 box Lot # [id number] Exo: 8/23

## 2019-11-19 ENCOUNTER — Other Ambulatory Visit: Payer: Self-pay

## 2019-11-19 ENCOUNTER — Ambulatory Visit (INDEPENDENT_AMBULATORY_CARE_PROVIDER_SITE_OTHER): Payer: Medicare HMO | Admitting: Physician Assistant

## 2019-11-19 ENCOUNTER — Encounter: Payer: Self-pay | Admitting: Physician Assistant

## 2019-11-19 VITALS — BP 166/77 | HR 43 | Resp 16 | Ht 67.0 in | Wt 190.6 lb

## 2019-11-19 DIAGNOSIS — Z8719 Personal history of other diseases of the digestive system: Secondary | ICD-10-CM

## 2019-11-19 DIAGNOSIS — Z8709 Personal history of other diseases of the respiratory system: Secondary | ICD-10-CM

## 2019-11-19 DIAGNOSIS — Z79899 Other long term (current) drug therapy: Secondary | ICD-10-CM | POA: Diagnosis not present

## 2019-11-19 DIAGNOSIS — M0579 Rheumatoid arthritis with rheumatoid factor of multiple sites without organ or systems involvement: Secondary | ICD-10-CM | POA: Diagnosis not present

## 2019-11-19 DIAGNOSIS — M8589 Other specified disorders of bone density and structure, multiple sites: Secondary | ICD-10-CM

## 2019-11-19 DIAGNOSIS — Z8639 Personal history of other endocrine, nutritional and metabolic disease: Secondary | ICD-10-CM

## 2019-11-19 DIAGNOSIS — L4050 Arthropathic psoriasis, unspecified: Secondary | ICD-10-CM | POA: Diagnosis not present

## 2019-11-19 DIAGNOSIS — Z8679 Personal history of other diseases of the circulatory system: Secondary | ICD-10-CM

## 2019-11-19 DIAGNOSIS — J8410 Pulmonary fibrosis, unspecified: Secondary | ICD-10-CM

## 2019-11-19 DIAGNOSIS — L409 Psoriasis, unspecified: Secondary | ICD-10-CM | POA: Diagnosis not present

## 2019-11-19 MED ORDER — ORENCIA CLICKJECT 125 MG/ML ~~LOC~~ SOAJ
SUBCUTANEOUS | 0 refills | Status: DC
Start: 2019-11-19 — End: 2019-11-29

## 2019-11-19 NOTE — Patient Instructions (Signed)
COVID-19 vaccine recommendations:  ° °COVID-19 vaccine is recommended for everyone (unless you are allergic to a vaccine component), even if you are on a medication that suppresses your immune system.  ° °If you are on Methotrexate, Cellcept (mycophenolate), Rinvoq, Xeljanz, and Olumiant- hold the medication for 1 week after each vaccine. Hold Methotrexate for 2 weeks after the single dose COVID-19 vaccine.  ° °If you are on Orencia subcutaneous injection - hold medication one week prior to and one week after the first COVID-19 vaccine dose (only).  ° °If you are on Orencia IV infusions- time vaccination administration so that the first COVID-19 vaccination will occur four weeks after the infusion and postpone the subsequent infusion by one week.  ° °If you are on Cyclophosphamide or Rituxan infusions please contact your doctor prior to receiving the COVID-19 vaccine.  ° °Do not take Tylenol or any anti-inflammatory medications (NSAIDs) 24 hours prior to the COVID-19 vaccination.  ° °There is no direct evidence about the efficacy of the COVID-19 vaccine in individuals who are on medications that suppress the immune system.  ° °Even if you are fully vaccinated, and you are on any medications that suppress your immune system, please continue to wear a mask, maintain at least six feet social distance and practice hand hygiene.  ° °If you develop a COVID-19 infection, please contact your PCP or our office to determine if you need antibody infusion. ° °The booster vaccine is now available for immunocompromised patients. It is advised that if you had Pfizer vaccine you should get Pfizer booster.  If you had a Moderna vaccine then you should get a Moderna booster. Johnson and Johnson does not have a booster vaccine at this time. ° °Please see the following web sites for updated information.   ° °https://www.rheumatology.org/Portals/0/Files/COVID-19-Vaccination-Patient-Resources.pdf ° °https://www.rheumatology.org/About-Us/Newsroom/Press-Releases/ID/1159 ° °Standing Labs °We placed an order today for your standing lab work.  ° °Please have your standing labs drawn in December and every 3 months  ° °If possible, please have your labs drawn 2 weeks prior to your appointment so that the provider can discuss your results at your appointment. ° °We have open lab daily °Monday through Thursday from 8:30-12:30 PM and 1:30-4:30 PM and Friday from 8:30-12:30 PM and 1:30-4:00 PM °at the office of Dr. Shaili Deveshwar, [facility].   °Please be advised, patients with office appointments requiring lab work will take precedents over walk-in lab work.  °If possible, please come for your lab work on Monday and Friday afternoons, as you may experience shorter wait times. °The office is located at [address], [address], [place], [place] [address] °No appointment is necessary.   °Labs are drawn by [facility]. Please bring your co-pay at the time of your lab draw.  You may receive a bill from [facility] for your lab work. ° °If you wish to have your labs drawn at another location, please call the office 24 hours in advance to send orders. ° °If you have any questions regarding directions or hours of operation,  °please call [phone].   °As a reminder, please drink plenty of water prior to coming for your lab work. Thanks! ° ° °

## 2019-11-29 ENCOUNTER — Telehealth: Payer: Self-pay

## 2019-11-29 ENCOUNTER — Other Ambulatory Visit: Payer: Self-pay | Admitting: Rheumatology

## 2019-11-29 ENCOUNTER — Other Ambulatory Visit: Payer: Self-pay

## 2019-11-29 DIAGNOSIS — Z79899 Other long term (current) drug therapy: Secondary | ICD-10-CM

## 2019-11-29 NOTE — Telephone Encounter (Signed)
Patient called requesting lab orders be released for [facility] in [place]. orders have been released and patient is aware.

## 2019-11-29 NOTE — Telephone Encounter (Signed)
Last Visit: 11/19/2019 Next Visit: 04/21/2020 Labs: 08/27/2019 Labs are stable. TB Gold: 05/27/2019, negative  Current Dose per office note 11/19/2019: Orencia 125 mg subcutaneous injections once weekly  Attempted to contact patient, left voicemail advising patient updated labs are needed.   DX: Psoriatic arthritis  Okay to refill Orenica?

## 2019-12-01 LAB — COMPLETE METABOLIC PANEL WITH GFR
AG Ratio: 1.5 (calc) (ref 1.0–2.5)
ALT: 19 U/L (ref 9–46)
AST: 28 U/L (ref 10–35)
Albumin: 3.9 g/dL (ref 3.6–5.1)
Alkaline phosphatase (APISO): 57 U/L (ref 35–144)
BUN: 10 mg/dL (ref 7–25)
CO2: 32 mmol/L (ref 20–32)
Calcium: 9.6 mg/dL (ref 8.6–10.3)
Chloride: 101 mmol/L (ref 98–110)
Creat: 0.74 mg/dL (ref 0.70–1.25)
GFR, Est African American: 111 mL/min/{1.73_m2} (ref >=60)
GFR, Est Non African American: 96 mL/min/{1.73_m2} (ref >=60)
Globulin: 2.6 g/dL (calc) (ref 1.9–3.7)
Glucose, Bld: 90 mg/dL (ref 65–99)
Potassium: 4.8 mmol/L (ref 3.5–5.3)
Sodium: 140 mmol/L (ref 135–146)
Total Bilirubin: 0.7 mg/dL (ref 0.2–1.2)
Total Protein: 6.5 g/dL (ref 6.1–8.1)

## 2019-12-01 LAB — CBC WITH DIFFERENTIAL/PLATELET
Absolute Monocytes: 787 cells/uL (ref 200–950)
Basophils Absolute: 76 cells/uL (ref 0–200)
Basophils Relative: 0.6 %
Eosinophils Absolute: 114 cells/uL (ref 15–500)
Eosinophils Relative: 0.9 %
HCT: 47 % (ref 38.5–50.0)
Hemoglobin: 15.4 g/dL (ref 13.2–17.1)
Lymphs Abs: 2896 cells/uL (ref 850–3900)
MCH: 28.5 pg (ref 27.0–33.0)
MCHC: 32.8 g/dL (ref 32.0–36.0)
MCV: 87 fL (ref 80.0–100.0)
MPV: 12.4 fL (ref 7.5–12.5)
Monocytes Relative: 6.2 %
Neutro Abs: 8827 cells/uL — ABNORMAL HIGH (ref 1500–7800)
Neutrophils Relative %: 69.5 %
Platelets: 226 10*3/uL (ref 140–400)
RBC: 5.4 10*6/uL (ref 4.20–5.80)
RDW: 13.5 % (ref 11.0–15.0)
Total Lymphocyte: 22.8 %
WBC: 12.7 10*3/uL — ABNORMAL HIGH (ref 3.8–10.8)

## 2019-12-01 NOTE — Progress Notes (Signed)
WBC count is mildly elevated but trending down.  Absolute neutrophils are elevated but trending down.  Rest of CBC WNL. CMP WNL.

## 2019-12-10 ENCOUNTER — Other Ambulatory Visit: Payer: Self-pay | Admitting: Internal Medicine

## 2019-12-21 ENCOUNTER — Other Ambulatory Visit: Payer: Self-pay

## 2019-12-21 ENCOUNTER — Ambulatory Visit (HOSPITAL_COMMUNITY)
Admission: RE | Admit: 2019-12-21 | Discharge: 2019-12-21 | Disposition: A | Discharge status: Home / Self Care | Payer: Medicare HMO | Source: Ambulatory Visit | Attending: Pulmonary Disease | Admitting: Pulmonary Disease

## 2019-12-21 ENCOUNTER — Other Ambulatory Visit: Payer: Self-pay | Admitting: Rheumatology

## 2019-12-21 DIAGNOSIS — J849 Interstitial pulmonary disease, unspecified: Secondary | ICD-10-CM | POA: Diagnosis not present

## 2019-12-21 DIAGNOSIS — D508 Other iron deficiency anemias: Secondary | ICD-10-CM

## 2019-12-21 MED ORDER — AZATHIOPRINE 50 MG PO TABS: 50.0000 mg | ORAL_TABLET | Freq: Two times a day (BID) | ORAL | 0 refills | Status: DC

## 2019-12-21 MED ORDER — PREDNISONE 5 MG PO TABS
ORAL_TABLET | ORAL | 0 refills | Status: DC
Start: 2019-12-21 — End: 2020-03-13

## 2019-12-21 NOTE — Telephone Encounter (Signed)
Last Visit: 11/19/2019 Next Visit: 04/21/2020 Labs: 11/30/2019 WBC count is mildly elevated but trending down. Absolute neutrophils are elevated but trending down. Rest of CBC WNL. CMP WNL.  Current Dose per office note on 11/19/2019: imuran 100 mg po qd, prednisone 5 mg po daily  Dx: Psoriatic arthritis   Okay to refill Imuran and Prednisone?

## 2019-12-21 NOTE — Telephone Encounter (Signed)
[employer] calling to get new rx for Azathioprine 50 mg 90 day supply, and Prednisone 5 mg. Fax # [phone]

## 2019-12-28 ENCOUNTER — Other Ambulatory Visit: Payer: Self-pay

## 2019-12-28 ENCOUNTER — Ambulatory Visit (INDEPENDENT_AMBULATORY_CARE_PROVIDER_SITE_OTHER): Payer: Medicare HMO | Admitting: Pulmonary Disease

## 2019-12-28 ENCOUNTER — Encounter: Payer: Self-pay | Admitting: Pulmonary Disease

## 2019-12-28 DIAGNOSIS — M05731 Rheumatoid arthritis with rheumatoid factor of right wrist without organ or systems involvement: Secondary | ICD-10-CM

## 2019-12-28 DIAGNOSIS — M05732 Rheumatoid arthritis with rheumatoid factor of left wrist without organ or systems involvement: Secondary | ICD-10-CM | POA: Diagnosis not present

## 2019-12-28 DIAGNOSIS — J8410 Pulmonary fibrosis, unspecified: Secondary | ICD-10-CM | POA: Diagnosis not present

## 2019-12-28 NOTE — Patient Instructions (Addendum)
OK to get booster covid shot Fibrosis is stable

## 2019-12-28 NOTE — Progress Notes (Signed)
° °  Subjective:    Patient ID: Curtis Flores, male    DOB: 12/20/1955, 64 y.o.   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    PFTs 2013 showed intraprenchymal restricton with FVC around 71% &DLCO 56% c/w ILD, no airway obstruction.  Rpt PFTs 10/2012 show mild drop in FVC to 66%, DLCO preserved   PFT 05/2015 FEV1 70%, ratio 82, FVC 66%, no BD response, DLCO 46%. DLCO has decreased from 2014.   PFT 12/2015 >> FVC 71%, DLCO 45% unc  PFTs 03/2018 show drop in FVC to 53%, TLC to 49%, DLCO maintained at 41%   PFTs 06/2019 moderate restriction, ratio 83, FVC 53%, DLCO stable at 11.8/28%   Review of Systems Patient denies significant dyspnea,cough, hemoptysis,  chest pain, palpitations, pedal edema, orthopnea, paroxysmal nocturnal dyspnea, lightheadedness, nausea, vomiting, abdominal or  leg pains      Objective:   Physical Exam  Gen.  Pleasant, well-nourished, in no distress ENT - no thrush, no pallor/icterus,no post nasal drip Neck: No JVD, no thyromegaly, no carotid bruits Lungs: no use of accessory muscles, no dullness to percussion, bibasal 1/3 rales no rhonchi  Cardiovascular: Rhythm regular, heart sounds  normal, no murmurs or gallops, no peripheral edema Musculoskeletal: No deformities, no cyanosis or clubbing  Skin- scaly patches       Assessment & Plan:

## 2019-12-28 NOTE — Assessment & Plan Note (Signed)
Appears controlled OK to get boster shot

## 2019-12-28 NOTE — Assessment & Plan Note (Signed)
Favor fibrotic NSIP Overall stable Rpt spiro + DLCO in 21months

## 2019-12-31 ENCOUNTER — Other Ambulatory Visit: Payer: Self-pay | Admitting: *Deleted

## 2019-12-31 MED ORDER — ORENCIA CLICKJECT 125 MG/ML ~~LOC~~ SOAJ
125.0000 mg | SUBCUTANEOUS | 0 refills | Status: DC
Start: 2019-12-31 — End: 2020-05-05

## 2019-12-31 NOTE — Telephone Encounter (Signed)
Last Visit: 11/19/2019 Next Visit: 04/21/2020 Labs: 11/30/2019 WBC count is mildly elevated but trending down. Absolute neutrophils are elevated but trending down. Rest of CBC WNL. CMP WNL. TB Gold: 05/27/2019 Neg  Current Dose per office note 11/19/2019: Orencia 125 mg subcutaneous injections once weekly  DX: Psoriatic arthritis  Okay to refill Orencia?

## 2020-01-04 ENCOUNTER — Other Ambulatory Visit: Payer: Self-pay | Admitting: Physician Assistant

## 2020-02-01 ENCOUNTER — Encounter: Payer: Self-pay | Admitting: Internal Medicine

## 2020-02-28 ENCOUNTER — Telehealth: Payer: Self-pay | Admitting: *Deleted

## 2020-02-28 NOTE — Telephone Encounter (Signed)
Patient will be faxing info today for patient assistance for Orencia, please call patient when complete at [phone]. Patient states info needs to be complete by 03/03/2020.

## 2020-03-01 NOTE — Telephone Encounter (Addendum)
Received fax from [facility] for provider form for Orencia pt assistance re-enrollment. Awaiting providers to return to office to sign form prior to faxing.  Chesley Mires, PharmD, MPH Clinical Pharmacist (Rheumatology and Pulmonology)

## 2020-03-02 NOTE — Telephone Encounter (Signed)
Submitted Patient Assistance Application to [employer] for [facility] along with provider portion, insurance info, and med list. Will update patient when we receive a response.  Fax# [phone] Phone# [phone]

## 2020-03-08 NOTE — Telephone Encounter (Addendum)
Received call from Ms. Cupo regarding application. Explained that we submitted provider portion on 03/02/20 and haven't received any updates from [employer].   Advised her to call [employer] for renewal status and that they may have many renewal applications they are processing with the start of the year.  Addended: Called [employer] about patient's PAP renewal. Patient is approved for [facility] patient assistance from 03/08/20 to 03/03/21.  Called patient and notified. He will schedule next Orencia shipment from pharmacy.  Addended 03/09/20: Received confirmation fax from [employer] confirming patient's renewal approval through 03/03/21.  Phone number: [phone]  Chesley Mires, PharmD, MPH Clinical Pharmacist (Rheumatology and Pulmonology)

## 2020-03-13 ENCOUNTER — Other Ambulatory Visit: Payer: Self-pay | Admitting: Physician Assistant

## 2020-03-13 ENCOUNTER — Other Ambulatory Visit: Payer: Self-pay | Admitting: *Deleted

## 2020-03-13 DIAGNOSIS — Z79899 Other long term (current) drug therapy: Secondary | ICD-10-CM

## 2020-03-13 DIAGNOSIS — D508 Other iron deficiency anemias: Secondary | ICD-10-CM

## 2020-03-13 NOTE — Telephone Encounter (Signed)
Last Visit: 11/19/2019 Next Visit: 04/04/2020 Labs: 11/30/2019, WBC count is mildly elevated but trending down. Absolute neutrophils are elevated but trending down. Rest of CBC WNL. CMP WNL. Called patient and advised labs are due.  Current Dose per office note 11/19/2019, Imuran 100 mg mouth daily, and prednisone 5 mg daily.   DX: Psoriatic arthritis   Okay to refill Imuran and Prednisone ?

## 2020-03-14 LAB — CBC WITH DIFFERENTIAL/PLATELET
Absolute Monocytes: 555 cells/uL (ref 200–950)
Basophils Absolute: 90 cells/uL (ref 0–200)
Basophils Relative: 0.7 %
Eosinophils Absolute: 65 cells/uL (ref 15–500)
Eosinophils Relative: 0.5 %
HCT: 45.7 % (ref 38.5–50.0)
Hemoglobin: 15.1 g/dL (ref 13.2–17.1)
Lymphs Abs: 2877 cells/uL (ref 850–3900)
MCH: 28.9 pg (ref 27.0–33.0)
MCHC: 33 g/dL (ref 32.0–36.0)
MCV: 87.4 fL (ref 80.0–100.0)
MPV: 12.7 fL — ABNORMAL HIGH (ref 7.5–12.5)
Monocytes Relative: 4.3 %
Neutro Abs: 9314 cells/uL — ABNORMAL HIGH (ref 1500–7800)
Neutrophils Relative %: 72.2 %
Platelets: 228 10*3/uL (ref 140–400)
RBC: 5.23 10*6/uL (ref 4.20–5.80)
RDW: 13.1 % (ref 11.0–15.0)
Total Lymphocyte: 22.3 %
WBC: 12.9 10*3/uL — ABNORMAL HIGH (ref 3.8–10.8)

## 2020-03-14 LAB — COMPLETE METABOLIC PANEL WITH GFR
AG Ratio: 1.5 (calc) (ref 1.0–2.5)
ALT: 21 U/L (ref 9–46)
AST: 27 U/L (ref 10–35)
Albumin: 3.8 g/dL (ref 3.6–5.1)
Alkaline phosphatase (APISO): 59 U/L (ref 35–144)
BUN: 10 mg/dL (ref 7–25)
CO2: 28 mmol/L (ref 20–32)
Calcium: 9.7 mg/dL (ref 8.6–10.3)
Chloride: 105 mmol/L (ref 98–110)
Creat: 0.7 mg/dL (ref 0.70–1.25)
GFR, Est African American: 114 mL/min/{1.73_m2} (ref >=60)
GFR, Est Non African American: 98 mL/min/{1.73_m2} (ref >=60)
Globulin: 2.6 g/dL (calc) (ref 1.9–3.7)
Glucose, Bld: 88 mg/dL (ref 65–139)
Potassium: 4.7 mmol/L (ref 3.5–5.3)
Sodium: 142 mmol/L (ref 135–146)
Total Bilirubin: 0.4 mg/dL (ref 0.2–1.2)
Total Protein: 6.4 g/dL (ref 6.1–8.1)

## 2020-03-15 NOTE — Progress Notes (Signed)
WBC count is elevated.  Absolute neutrophils are elevated but stable. CMP WNL.  We will continue to monitor.

## 2020-03-21 ENCOUNTER — Other Ambulatory Visit (HOSPITAL_COMMUNITY): Payer: Self-pay | Admitting: *Deleted

## 2020-03-21 DIAGNOSIS — D508 Other iron deficiency anemias: Secondary | ICD-10-CM

## 2020-03-21 DIAGNOSIS — D72829 Elevated white blood cell count, unspecified: Secondary | ICD-10-CM

## 2020-03-21 DIAGNOSIS — D72823 Leukemoid reaction: Secondary | ICD-10-CM

## 2020-03-22 ENCOUNTER — Inpatient Hospital Stay (HOSPITAL_COMMUNITY): Payer: Medicare HMO | Attending: Hematology

## 2020-03-22 ENCOUNTER — Other Ambulatory Visit: Payer: Self-pay

## 2020-03-22 DIAGNOSIS — M069 Rheumatoid arthritis, unspecified: Secondary | ICD-10-CM | POA: Insufficient documentation

## 2020-03-22 DIAGNOSIS — L4050 Arthropathic psoriasis, unspecified: Secondary | ICD-10-CM | POA: Diagnosis not present

## 2020-03-22 DIAGNOSIS — Z79899 Other long term (current) drug therapy: Secondary | ICD-10-CM | POA: Insufficient documentation

## 2020-03-22 DIAGNOSIS — J8410 Pulmonary fibrosis, unspecified: Secondary | ICD-10-CM | POA: Diagnosis not present

## 2020-03-22 DIAGNOSIS — R5383 Other fatigue: Secondary | ICD-10-CM | POA: Diagnosis not present

## 2020-03-22 DIAGNOSIS — I4891 Unspecified atrial fibrillation: Secondary | ICD-10-CM | POA: Insufficient documentation

## 2020-03-22 DIAGNOSIS — I255 Ischemic cardiomyopathy: Secondary | ICD-10-CM | POA: Insufficient documentation

## 2020-03-22 DIAGNOSIS — D72828 Other elevated white blood cell count: Secondary | ICD-10-CM | POA: Diagnosis present

## 2020-03-22 DIAGNOSIS — I10 Essential (primary) hypertension: Secondary | ICD-10-CM | POA: Diagnosis not present

## 2020-03-22 DIAGNOSIS — D72829 Elevated white blood cell count, unspecified: Secondary | ICD-10-CM

## 2020-03-22 DIAGNOSIS — Z8249 Family history of ischemic heart disease and other diseases of the circulatory system: Secondary | ICD-10-CM | POA: Insufficient documentation

## 2020-03-22 DIAGNOSIS — I252 Old myocardial infarction: Secondary | ICD-10-CM | POA: Insufficient documentation

## 2020-03-22 DIAGNOSIS — M2550 Pain in unspecified joint: Secondary | ICD-10-CM | POA: Diagnosis not present

## 2020-03-22 DIAGNOSIS — Z7901 Long term (current) use of anticoagulants: Secondary | ICD-10-CM | POA: Diagnosis not present

## 2020-03-22 DIAGNOSIS — M19042 Primary osteoarthritis, left hand: Secondary | ICD-10-CM | POA: Diagnosis not present

## 2020-03-22 DIAGNOSIS — E785 Hyperlipidemia, unspecified: Secondary | ICD-10-CM | POA: Insufficient documentation

## 2020-03-22 DIAGNOSIS — Z87891 Personal history of nicotine dependence: Secondary | ICD-10-CM | POA: Diagnosis not present

## 2020-03-22 DIAGNOSIS — D72823 Leukemoid reaction: Secondary | ICD-10-CM

## 2020-03-22 DIAGNOSIS — D508 Other iron deficiency anemias: Secondary | ICD-10-CM

## 2020-03-22 LAB — COMPREHENSIVE METABOLIC PANEL
ALT: 24 U/L (ref 0–44)
AST: 29 U/L (ref 15–41)
Albumin: 3.6 g/dL (ref 3.5–5.0)
Alkaline Phosphatase: 53 U/L (ref 38–126)
Anion gap: 7 (ref 5–15)
BUN: 12 mg/dL (ref 8–23)
CO2: 30 mmol/L (ref 22–32)
Calcium: 9.2 mg/dL (ref 8.9–10.3)
Chloride: 103 mmol/L (ref 98–111)
Creatinine, Ser: 0.81 mg/dL (ref 0.61–1.24)
GFR, Estimated: >60 mL/min (ref >60)
Glucose, Bld: 87 mg/dL (ref 70–99)
Potassium: 4.2 mmol/L (ref 3.5–5.1)
Sodium: 140 mmol/L (ref 135–145)
Total Bilirubin: 0.5 mg/dL (ref 0.3–1.2)
Total Protein: 6.7 g/dL (ref 6.5–8.1)

## 2020-03-22 LAB — CBC WITH DIFFERENTIAL/PLATELET
Abs Immature Granulocytes: 0.07 10*3/uL (ref 0.00–0.07)
Basophils Absolute: 0.1 10*3/uL (ref 0.0–0.1)
Basophils Relative: 1 %
Eosinophils Absolute: 0.1 10*3/uL (ref 0.0–0.5)
Eosinophils Relative: 1 %
HCT: 47.9 % (ref 39.0–52.0)
Hemoglobin: 15 g/dL (ref 13.0–17.0)
Immature Granulocytes: 1 %
Lymphocytes Relative: 24 %
Lymphs Abs: 2.9 10*3/uL (ref 0.7–4.0)
MCH: 28.6 pg (ref 26.0–34.0)
MCHC: 31.3 g/dL (ref 30.0–36.0)
MCV: 91.4 fL (ref 80.0–100.0)
Monocytes Absolute: 1 10*3/uL (ref 0.1–1.0)
Monocytes Relative: 8 %
Neutro Abs: 7.9 10*3/uL — ABNORMAL HIGH (ref 1.7–7.7)
Neutrophils Relative %: 65 %
Platelets: 223 10*3/uL (ref 150–400)
RBC: 5.24 MIL/uL (ref 4.22–5.81)
RDW: 13.4 % (ref 11.5–15.5)
WBC: 12 10*3/uL — ABNORMAL HIGH (ref 4.0–10.5)
nRBC: 0 % (ref 0.0–0.2)

## 2020-03-22 LAB — LACTATE DEHYDROGENASE: LDH: 154 U/L (ref 98–192)

## 2020-03-29 ENCOUNTER — Inpatient Hospital Stay (HOSPITAL_BASED_OUTPATIENT_CLINIC_OR_DEPARTMENT_OTHER): Payer: Medicare HMO | Admitting: Hematology

## 2020-03-29 VITALS — BP 164/66 | HR 42 | Temp 96.9°F | Resp 17 | Wt 189.8 lb

## 2020-03-29 DIAGNOSIS — D72825 Bandemia: Secondary | ICD-10-CM

## 2020-03-29 DIAGNOSIS — D72828 Other elevated white blood cell count: Secondary | ICD-10-CM | POA: Diagnosis not present

## 2020-03-29 NOTE — Progress Notes (Signed)
[place], [place] [address]   CLINIC:  Medical Oncology/Hematology  PCP:  Chesley Noon, MD [place] / [place] [id number]  [phone]  REASON FOR VISIT:  Follow-up for leukocytosis  PRIOR THERAPY: None  CURRENT THERAPY: Observation  INTERVAL HISTORY:  [name], a 65 y.o. male, returns for routine follow-up for his leukocytosis. Jacque was last seen on 03/16/2019.  Today he reports feeling well. 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 He denies having any recent infections, F/C, night sweats, unexpected weight loss or ED visits. His appetite is excellent.  He will see his rheumatologist in February.   REVIEW OF SYSTEMS:  Review of Systems  Constitutional: Positive for fatigue (50%). Negative for appetite change, chills, diaphoresis, fever and unexpected weight change.  Musculoskeletal: Positive for arthralgias (OA in hands & elbows stable).  All other systems reviewed and are negative.   PAST MEDICAL/SURGICAL HISTORY:  Past Medical History:  Diagnosis Date  . Abnormal CT scan, stomach   . Chronic systolic dysfunction of left ventricle   . Coronary artery disease    lateral STEMI 02/22/15 3/10 cutting balloon to ISR of the o/pLCX  . GERD (gastroesophageal reflux disease)   . Hyperlipidemia   . Hypertension   . Ischemic cardiomyopathy   . LBBB (left bundle branch block)   . Leukocytosis    followed by hematology, reactive  . Lymphadenopathy   . Myocardial infarction ([place]) 02/2015  . Psoriasis 2003  . Psoriatic arthritis ([place])   . Pulmonary fibrosis ([place])   . Rheumatoid arthritis(714.0) 2012  . Typical atrial flutter [facility])    Past Surgical History:  Procedure Laterality Date  . CARDIAC CATHETERIZATION N/A 02/22/2015   Procedure: Left Heart Cath and Coronary Angiography;  Surgeon: Lorretta Harp, MD;  Location: [place] CV LAB;  Service: Cardiovascular;  Laterality: N/A;  . CARDIAC CATHETERIZATION N/A 02/22/2015   Procedure: Coronary Stent Intervention;  Surgeon: Lorretta Harp, MD;  Location: [place] CV LAB;  Service: Cardiovascular;  Laterality: N/A;  . CARDIOVERSION N/A 04/12/2015   Procedure: CARDIOVERSION;  Surgeon: Larey Dresser, MD;  Location: [facility] ENDOSCOPY;  Service: Cardiovascular;  Laterality: N/A;  . COLONOSCOPY  07/01/2003   VTV:NRWCHJ colonic mucosa except for the proximal right colon in the area of ICV which was not seen completely due to inadequate bowel prep. followed with ACBE which was normal.   . COLONOSCOPY N/A 08/24/2015   Dr. Gala Romney: Normal colon. Next colonoscopy in 10 years.  . CORONARY BALLOON ANGIOPLASTY N/A 05/12/2018   Procedure: CORONARY BALLOON ANGIOPLASTY;  Surgeon: Belva Crome, MD;  Location: [place] CV LAB;  Service: Cardiovascular;  Laterality: N/A;  . ELECTROPHYSIOLOGIC STUDY N/A 05/30/2015   Atrial fibrillation ablation by Dr Rayann Heman  . ESOPHAGOGASTRODUODENOSCOPY N/A 08/24/2015   Dr. Gala Romney: Medium-sized hiatal hernia, erosive gastropathy. Cameron lesions. Esophageal mucosa distally suggestive of short segment Barrett's esophagus. Not confirmed on biopsy. Gastric biopsy with minimal chronic inflammation  . GIVENS CAPSULE STUDY N/A 04/17/2016   Procedure: GIVENS CAPSULE STUDY;  Surgeon: Daneil Dolin, MD;  [facility];  Service: Endoscopy;  Laterality: N/A;  Pt to arrive at 8:00 am for 8:30 am appt  . LEFT HEART CATH AND CORONARY ANGIOGRAPHY N/A 05/11/2018   Procedure: LEFT HEART CATH AND CORONARY ANGIOGRAPHY;  Surgeon: Belva Crome, MD;  Location: [facility]  INVASIVE CV LAB;  Service: Cardiovascular;  Laterality: N/A;  . TEE WITHOUT CARDIOVERSION N/A 04/12/2015   Procedure: TRANSESOPHAGEAL ECHOCARDIOGRAM (TEE);  Surgeon: Larey Dresser, MD;  Location: [facility] ENDOSCOPY;  Service: Cardiovascular;  Laterality: N/A;    SOCIAL HISTORY:  Social History    Socioeconomic History  . Marital status: Married    Spouse name: Not on file  . Number of children: 2  . Years of education: Not on file  . Highest education level: Not on file  Occupational History  . 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 Smoking status: Former Smoker    Packs/day: 1.00    Years: 30.00    Pack years: 30.00    Types: Cigarettes    Quit date: 03/21/2003    Years since quitting: 17.0  . Smokeless tobacco: Never Used  Vaping Use  . Vaping Use: Never used  Substance and Sexual Activity  . Alcohol use: No    Alcohol/week: 0.0 standard drinks    Comment: H/O case of beer weekly x 20 years, quiting in 2000-ish.  . Drug use: No    Comment: H/O marijuana use many years ago.  [name] Sexual activity: Yes    Birth control/protection: None  Other Topics Concern  . Not on file  Social History Narrative  . Not on file   Social Determinants of Health   Financial Resource Strain: Not on file  Food Insecurity: Not on file  Transportation Needs: Not on file  Physical Activity: Not on file  Stress: Not on file  Social Connections: Not on file  Intimate Partner Violence: Not on file    FAMILY HISTORY:  Family History  Problem Relation Age of Onset  . Hypertension Mother   . Colon cancer Neg Hx     CURRENT MEDICATIONS:  Current Outpatient Medications  Medication Sig Dispense Refill  . Abatacept (ORENCIA CLICKJECT) 672 MG/ML SOAJ Inject 125 mg into the skin once a week. 12 mL 0  . amLODipine (NORVASC) 5 MG tablet Take 1 tablet (5 mg total) by mouth daily. 90 tablet 3  . apixaban (ELIQUIS) 5 MG TABS tablet Take 1 tablet (5 mg total) by mouth 2 (two) times daily. 180 tablet 1  . atorvastatin (LIPITOR) 80 MG tablet TAKE 1 TABLET (80 MG TOTAL) BY MOUTH DAILY AT 6 PM. 90 tablet 1  . azaTHIOprine (IMURAN) 50 MG tablet TAKE 1 TABLET TWICE DAILY 180 tablet 0  . Clobetasol Prop  Emollient Base 0.05 % emollient cream APPLY TO AFFECTED AREA TWICE A DAY 30 g 2  . clopidogrel (PLAVIX) 75 MG tablet Take 1 tablet (75 mg total) by mouth daily. 90 tablet 3  . furosemide (LASIX) 40 MG tablet Take 1 tablet (40 mg total) by mouth 2 (two) times daily. 30 tablet 0  . losartan (COZAAR) 100 MG tablet TAKE 1 TABLET BY MOUTH EVERY DAY 90 tablet 1  . pantoprazole (PROTONIX) 40 MG tablet TAKE 1 TABLET TWICE DAILY 180 tablet 1  . potassium chloride SA (KLOR-CON) 20 MEQ tablet TAKE 2 TABLETS (40 MEQ TOTAL) BY MOUTH 2 (TWO) TIMES DAILY. 360 tablet 3  . predniSONE (DELTASONE) 5 MG tablet TAKE 1 TABLET BY MOUTH EVERY DAY WITH BREAKFAST 90 tablet 0  . sotalol (BETAPACE) 120 MG tablet TAKE 1 TABLET EVERY 12 HOURS 180 tablet 3   No current facility-administered medications for this visit.  ALLERGIES:  No Known Allergies  PHYSICAL EXAM:  Performance status (ECOG): 1 - Symptomatic but completely ambulatory  Vitals:   03/29/20 1107  BP: (!) 164/66  Pulse: (!) 42  Resp: 17  Temp: (!) 96.9 F (36.1 C)  SpO2: 97%   Wt Readings from Last 3 Encounters:  03/29/20 189 lb 12.8 oz (86.1 kg)  12/28/19 190 lb (86.2 kg)  11/19/19 190 lb 9.6 oz (86.5 kg)   Physical Exam Vitals reviewed.  Constitutional:      Appearance: Normal appearance.  Cardiovascular:     Rate and Rhythm: Normal rate and regular rhythm.     Pulses: Normal pulses.     Heart sounds: Normal heart sounds.  Pulmonary:     Effort: Pulmonary effort is normal.     Breath sounds: Normal breath sounds.  Chest:  Breasts:     Right: No supraclavicular adenopathy.     Left: No supraclavicular adenopathy.    Abdominal:     Palpations: Abdomen is soft. There is no hepatomegaly, splenomegaly or mass.     Tenderness: There is no abdominal tenderness.     Hernia: No hernia is present.  Musculoskeletal:     Right lower leg: No edema.     Left lower leg: No edema.  Lymphadenopathy:     Cervical: No cervical adenopathy.      Upper Body:     Right upper body: No supraclavicular adenopathy.     Left upper body: No supraclavicular adenopathy.     Lower Body: No right inguinal adenopathy. No left inguinal adenopathy.  Neurological:     General: No focal deficit present.     Mental Status: He is alert and oriented to person, place, and time.  Psychiatric:        Mood and Affect: Mood normal.        Behavior: Behavior normal.     LABORATORY DATA:  I have reviewed the labs as listed.  CBC Latest Ref Rng & Units 03/22/2020 03/14/2020 11/30/2019  WBC 4.0 - 10.5 K/uL 12.0(H) 12.9(H) 12.7(H)  Hemoglobin 13.0 - 17.0 g/dL 15.0 15.1 15.4  Hematocrit 39.0 - 52.0 % 47.9 45.7 47.0  Platelets 150 - 400 K/uL 223 228 226   CMP Latest Ref Rng & Units 03/22/2020 03/14/2020 11/30/2019  Glucose 70 - 99 mg/dL 87 88 90  BUN 8 - 23 mg/dL '12 10 10  ' Creatinine 0.61 - 1.24 mg/dL 0.81 0.70 0.74  Sodium 135 - 145 mmol/L 140 142 140  Potassium 3.5 - 5.1 mmol/L 4.2 4.7 4.8  Chloride 98 - 111 mmol/L 103 105 101  CO2 22 - 32 mmol/L 30 28 32  Calcium 8.9 - 10.3 mg/dL 9.2 9.7 9.6  Total Protein 6.5 - 8.1 g/dL 6.7 6.4 6.5  Total Bilirubin 0.3 - 1.2 mg/dL 0.5 0.4 0.7  Alkaline Phos 38 - 126 U/L 53 - -  AST 15 - 41 U/L '29 27 28  ' ALT 0 - 44 U/L '24 21 19      ' Component Value Date/Time   RBC 5.24 03/22/2020 1044   MCV 91.4 03/22/2020 1044   MCV 82.2 08/12/2011 1256   MCH 28.6 03/22/2020 1044   MCHC 31.3 03/22/2020 1044   RDW 13.4 03/22/2020 1044   RDW 14.7 (H) 08/12/2011 1256   LYMPHSABS 2.9 03/22/2020 1044   LYMPHSABS 5.5 (H) 08/12/2011 1256   MONOABS 1.0 03/22/2020 1044   MONOABS 1.6 (H) 08/12/2011 1256   EOSABS 0.1 03/22/2020 1044   EOSABS 0.2 08/12/2011  1256   BASOSABS 0.1 03/22/2020 1044   BASOSABS 0.1 08/12/2011 1256   Lab Results  Component Value Date   LDH 154 03/22/2020   LDH 178 03/09/2019   LDH 207 (H) 08/18/2018    DIAGNOSTIC IMAGING:  I have independently reviewed the scans and discussed with the patient. No  results found.   ASSESSMENT:  1. Neutrophilic leukocytosis: -He was followed in our clinic for leukocytosis since 2013. -Bone marrow biopsy on 04/10/2011 showed normocellular marrow with trilineage hematopoiesis. Flow cytometry was negative. Lymphocytes with no aberrant phenotype. JAK2 V617F and BCR/ABL was negative. -He is on chronic prednisone which could be contributing to his leukocytosis.   2. Rheumatoid arthritis/psoriatic arthritis: -He is currently taking prednisone 5 mg daily. He is also taking Orencia injection weekly.   PLAN:  1. Neutrophilic leukocytosis: -He does not have any B symptoms.  No recurrent infections noted. -No palpable adenopathy or splenomegaly. -Reviewed labs from 03/22/2020.  White count is 12 with differential showing increase in absolute neutrophil count.  LDH was normal.  White count likely from prednisone. -RTC 1 year for follow-up with labs.  2. Rheumatoid arthritis/psoriatic arthritis: -Continue prednisone and Orencia.  Orders placed this encounter:  No orders of the defined types were placed in this encounter.    Derek Jack, MD [place] [phone]   I, Milinda Antis, am acting as a scribe for Dr. Sanda Linger.  I, Derek Jack MD, have reviewed the above documentation for accuracy and completeness, and I agree with the above.

## 2020-03-29 NOTE — Patient Instructions (Addendum)
[facility] at [facility] Discharge Instructions  You were seen today by Dr. Katragadda. He went over your recent results. Dr. Katragadda will see you back in 1 year for labs and follow up.   Thank you for choosing [facility] at [facility] to provide your oncology and hematology care.  To afford each patient quality time with our provider, please arrive at least 15 minutes before your scheduled appointment time.   If you have a lab appointment with the Cancer Center please come in thru the Main Entrance and check in at the main information desk  You need to re-schedule your appointment should you arrive 10 or more minutes late.  We strive to give you quality time with our providers, and arriving late affects you and other patients whose appointments are after yours.  Also, if you no show three or more times for appointments you may be dismissed from the clinic at the providers discretion.     Again, thank you for choosing [facility].  Our hope is that these requests will decrease the amount of time that you wait before being seen by our physicians.       _____________________________________________________________  Should you have questions after your visit to  Cancer Center, please contact our office at [phone] between the hours of 8:00 a.m. and 4:30 p.m.  Voicemails left after 4:00 p.m. will not be returned until the following business day.  For prescription refill requests, have your pharmacy contact our office and allow 72 hours.    Cancer Center Support Programs:   > Cancer Support Group  2nd Tuesday of the month 1pm-2pm, Journey Room    

## 2020-04-06 ENCOUNTER — Other Ambulatory Visit: Payer: Self-pay | Admitting: Cardiovascular Disease

## 2020-04-07 NOTE — Progress Notes (Signed)
Office Visit Note  Patient: Curtis Flores             Date of Birth: 23-Aug-1955           MRN: [id number]             PCP: Chesley Noon, MD Referring: Chesley Noon, MD Visit Date: 04/21/2020 Occupation: @GUAROCC @  Subjective:  Scalp psoriasis   History of Present Illness: MAVERIC PILTZ is a 65 y.o. male with history of psoriatic arthritis, seropositive rheumatoid arthritis, and pulmonary fibrosis.  Patient is on Orencia 125 mg subcutaneous injections once weekly, Imuran 50 mg 2 tablets by mouth daily, and prednisone 5 mg by mouth daily.  He has been unable to taper off of prednisone.  He denies any recent flares.  He is not having any joint pain, joint swelling, morning stiffness, or nocturnal pain.  He has not had any difficulty with ADLs.  He states that he has some psoriasis on his scalp but has been using clobetasol cream topically as needed which has been effective.  Patient reports that he has some ongoing shortness of breath but has not noticed any new or worsening pulmonary symptoms.  He has an upcoming appointment with Dr. Elsworth Soho this spring. He denies any recent infections.   Activities of Daily Living:  Patient reports morning stiffness for 0 minutes.   Patient Denies nocturnal pain.  Difficulty dressing/grooming: Denies Difficulty climbing stairs: Denies Difficulty getting out of chair: Denies Difficulty using hands for taps, buttons, cutlery, and/or writing: Denies  Review of Systems  Constitutional: Negative for fatigue.  HENT: Negative for mouth sores, mouth dryness and nose dryness.   Eyes: Negative for pain, itching and dryness.  Respiratory: Positive for shortness of breath. Negative for difficulty breathing.   Cardiovascular: Negative for chest pain and palpitations.  Gastrointestinal: Negative for blood in stool, constipation and diarrhea.  Endocrine: Negative for increased urination.  Genitourinary: Negative for difficulty urinating.  Musculoskeletal:  Negative for arthralgias, joint pain, joint swelling, myalgias, morning stiffness, muscle tenderness and myalgias.  Skin: Negative for color change, rash and redness.  Allergic/Immunologic: Negative for susceptible to infections.  Neurological: Negative for dizziness, numbness, headaches, memory loss and weakness.  Hematological: Positive for bruising/bleeding tendency.  Psychiatric/Behavioral: Negative for confusion.    PMFS History:  Patient Active Problem List   Diagnosis Date Noted  . Atrial fibrillation with RVR ([place]) 05/28/2018  . Chronic anticoagulation 05/28/2018  . NSTEMI (non-ST elevated myocardial infarction) ([place]) 05/09/2018  . Sepsis ([place]) 01/31/2018  . Ischemic cardiomyopathy 12/04/2016  . Leukocytosis 07/17/2016  . Pain in joint involving multiple sites 04/25/2016  . Psoriatic arthritis ([place]) 02/14/2016  . High risk medication use 02/14/2016  . NICM (nonischemic cardiomyopathy) ([place]) 08/29/2015  . Essential hypertension 08/29/2015  . Abnormal CT scan, stomach   . Hiatal hernia   . GERD (gastroesophageal reflux disease) 08/01/2015  . Weight loss, unintentional 08/01/2015  . Iron deficiency anemia 06/06/2015  . Heme positive stool 06/06/2015  . PAF (paroxysmal atrial fibrillation) ([place]) 05/30/2015  . CAD S/P percutaneous coronary angioplasty 04/07/2015  . 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 Acute on chronic systolic congestive heart failure ([place]) 04/07/2015  . LBBB (left bundle branch block) 04/07/2015  . Wide-complex tachycardia ([place]) 04/07/2015  . Dyslipidemia, goal LDL below 70 03/07/2015  . Psoriasis   . Rheumatoid arthritis ([place])   . Pulmonary fibrosis ([place]) 03/21/2011    Past Medical History:  Diagnosis Date  . Abnormal CT scan,  stomach   . Chronic systolic dysfunction of left ventricle   . Coronary artery disease    lateral STEMI 02/22/15 3/10 cutting balloon to ISR of the o/pLCX  . GERD (gastroesophageal reflux disease)   .  Hyperlipidemia   . Hypertension   . Ischemic cardiomyopathy   . LBBB (left bundle branch block)   . Leukocytosis    followed by hematology, reactive  . Lymphadenopathy   . Myocardial infarction ([place]) 02/2015  . Psoriasis 2003  . Psoriatic arthritis ([place])   . Pulmonary fibrosis ([place])   . Rheumatoid arthritis(714.0) 2012  . Typical atrial flutter (H[facility])     Family History  Problem Relation Age of Onset  . Hypertension Mother   . Colon cancer Neg Hx    Past Surgical History:  Procedure Laterality Date  . CARDIAC CATHETERIZATION N/A 02/22/2015   Procedure: Left Heart Cath and Coronary Angiography;  Surgeon: Lorretta Harp, MD;  Location: [facility];  Service: Cardiovascular;  Laterality: N/A;  . CARDIAC CATHETERIZATION N/A 02/22/2015   Procedure: Coronary Stent Intervention;  Surgeon: Lorretta Harp, MD;  Location: [facility];  Service: Cardiovascular;  Laterality: N/A;  . CARDIOVERSION N/A 04/12/2015   Procedure: CARDIOVERSION;  Surgeon: Larey Dresser, MD;  Location: [facility] ENDOSCOPY;  Service: Cardiovascular;  Laterality: N/A;  . COLONOSCOPY  07/01/2003   [id number] colonic mucosa except for the proximal right colon in the area of ICV which was not seen completely due to inadequate bowel prep. followed with ACBE which was normal.   . COLONOSCOPY N/A 08/24/2015   Dr. Gala Romney: Normal colon. Next colonoscopy in 10 years.  . CORONARY BALLOON ANGIOPLASTY N/A 05/12/2018   Procedure: CORONARY BALLOON ANGIOPLASTY;  Surgeon: Belva Crome, MD;  Location: [facility];  Service: Cardiovascular;  Laterality: N/A;  . ELECTROPHYSIOLOGIC STUDY N/A 05/30/2015   Atrial fibrillation ablation by Dr Rayann Heman  . ESOPHAGOGASTRODUODENOSCOPY N/A 08/24/2015   Dr. Gala Romney: Medium-sized hiatal hernia, erosive gastropathy. Cameron lesions. Esophageal mucosa distally suggestive of short segment Barrett's esophagus. Not confirmed on biopsy. Gastric biopsy with minimal chronic inflammation  . GIVENS  CAPSULE STUDY N/A 04/17/2016   Procedure: GIVENS CAPSULE STUDY;  Surgeon: Daneil Dolin, MD;  [facility];  Service: Endoscopy;  Laterality: N/A;  Pt to arrive at 8:00 am for 8:30 am appt  . LEFT HEART CATH AND CORONARY ANGIOGRAPHY N/A 05/11/2018   Procedure: LEFT HEART CATH AND CORONARY ANGIOGRAPHY;  Surgeon: Belva Crome, MD;  Location: [facility];  Service: Cardiovascular;  Laterality: N/A;  . TEE WITHOUT CARDIOVERSION N/A 04/12/2015   Procedure: TRANSESOPHAGEAL ECHOCARDIOGRAM (TEE);  Surgeon: Larey Dresser, MD;  Location[facility] ENDOSCOPY;  Service: Cardiovascular;  Laterality: N/A;   Social History   Social History Narrative  . Not on file   Immunization History  Administered Date(s) Administered  . Fluad Quad(high Dose 65+) 12/09/2018  . Influenza Split 12/22/2010  . Influenza Whole 01/18/2010  . Influenza, High Dose Seasonal PF 11/11/2017, 12/02/2018  . Influenza, Seasonal, Injecte, Preservative Fre 12/03/2014, 12/13/2015, 12/03/2017  . Influenza,inj,Quad PF,6+ Mos 11/29/2019  . Influenza-Unspecified 12/03/2014, 12/13/2015, 11/12/2016  . Moderna Sars-Covid-2 Vaccination 05/08/2019, 06/09/2019, 01/29/2020  . Pneumococcal Conjugate-13 08/20/2016, 04/07/2018  . Pneumococcal Polysaccharide-23 01/22/2011, 03/05/2011  . Pneumococcal-Unspecified 01/22/2011, 03/05/2011     Objective: Vital Signs: BP (!) 144/87 (BP Location: Right Arm, Patient Position: Sitting, Cuff Size: Normal)   Pulse (!) 57   Resp 17   Ht 5\' 7"  (1.702 m)  Wt 196 lb (88.9 kg)   BMI 30.70 kg/m    Physical Exam Vitals and nursing note reviewed.  Constitutional:      Appearance: He is well-developed and well-nourished.  HENT:     Head: Normocephalic and atraumatic.  Eyes:     Extraocular Movements: EOM normal.     Conjunctiva/sclera: Conjunctivae normal.     Pupils: Pupils are equal, round, and reactive to light.  Pulmonary:     Effort: Pulmonary effort is normal.  Abdominal:      Palpations: Abdomen is soft.  Musculoskeletal:     Cervical back: Normal range of motion and neck supple.  Skin:    General: Skin is warm and dry.     Capillary Refill: Capillary refill takes less than 2 seconds.  Neurological:     Mental Status: He is alert and oriented to person, place, and time.  Psychiatric:        Mood and Affect: Mood and affect normal.        Behavior: Behavior normal.      Musculoskeletal Exam: C-spine, thoracic spine, and lumbar spine good ROM.  No midline spinal tenderness.  No SI joint tenderness.  Shoulder joints, elbow joints, wrist joints, MCPs, PIPs, and DIPs good ROM with no synovitis. Thickening of both wrist joints noted. Thickening of bilateral 1st-3rd MCPs but no synovitis or tenderness noted.  PIP and DIP thickening bilaterally.  Hip joints good ROM with no discomfort.  Knee joints good ROM with no warmth or effusion.  Ankle joints good ROM with no tenderness or swelling.  No achilles tendonitis.   CDAI Exam: CDAI Score: 0.1  Patient Global: 0 mm; Provider Global: 1 mm Swollen: 0 ; Tender: 0  Joint Exam 04/21/2020   No joint exam has been documented for this visit   There is currently no information documented on the homunculus. Go to the Rheumatology activity and complete the homunculus joint exam.  Investigation: No additional findings.  Imaging: No results found.  Recent Labs: Lab Results  Component Value Date   WBC 12.0 (H) 03/22/2020   HGB 15.0 03/22/2020   PLT 223 03/22/2020   NA 140 03/22/2020   K 4.2 03/22/2020   CL 103 03/22/2020   CO2 30 03/22/2020   GLUCOSE 87 03/22/2020   BUN 12 03/22/2020   CREATININE 0.81 03/22/2020   BILITOT 0.5 03/22/2020   ALKPHOS 53 03/22/2020   AST 29 03/22/2020   ALT 24 03/22/2020   PROT 6.7 03/22/2020   ALBUMIN 3.6 03/22/2020   CALCIUM 9.2 03/22/2020   GFRAA 114 03/14/2020   QFTBGOLDPLUS NEGATIVE 05/27/2019    Speciality Comments: No specialty comments available.  Procedures:  No  procedures performed Allergies: Patient has no known allergies.   Assessment / Plan:     Visit Diagnoses: Psoriatic arthritis ([place]): He has no synovitis or dactylitis on exam.  He has not had any recent flares.  He is not experiencing any morning stiffness or nocturnal pain.  He has no Achilles tendinitis or plantar fasciitis.  No SI joint tenderness on examination today.  He continues to have some scalp psoriasis which has improved since using clobetasol cream topically as needed.  Refill was sent to the pharmacy.  He is clinically doing well on Orencia 125 mg sq injections once weekly, imuran 50 mg 2 tablets by mouth daily, and prednisone 5 mg daily.  He has been unable to taper off of prednisone without having recurrent flares.  He has found the current regimen  to be effective at managing his arthritis.  No medication changes will be made at this time.  He was advised to notify us if he develops increased joint pain or joint swelling.  He will follow-up in the office in 5 months.  Rheumatoid arthritis involving multiple sites with positive rheumatoid factor [facility]): He has no joint tenderness or synovitis on exam.  He has not had any recent flares.  He is clinically doing well on Orencia 125 mg subcu injections once weekly, Imuran 50 mg 2 tablets by mouth daily, and prednisone 5 mg by mouth daily. He will continue on the current treatment regimen.  Psoriasis: Scalp psoriasis-He has been using clobetasol cream 0.05% topically twice daily as needed which has been managing his psoriasis.  He has some hyperpigmentation on his scalp but overall the patches of psoriasis are less thickened and scaly.  A refill of clobetasol cream sent to the pharmacy.  He will continue on the current treatment regimen.  High risk medication use - Orencia 125 mg sq weekly injections (10/11/2016), imuran 100 mg po qd, prednisone 5 mg po daily ( Inadequate response to Enbrel, Humira, Cosyntex, Otezla ).  CBC and CMP were drawn on  03/22/2020 and were reviewed with the patient today in the office.  His next lab work will be due in April and every 3 months to monitor for drug toxicity.  Standing orders for CBC and CMP remain in place.  TB gold negative on 05/27/2019.  Future order for TB gold was placed today.- Plan: QuantiFERON-TB Gold Plus He has not had any recent infections.  He was advised to hold Imuran and Orencia if he develops signs or symptoms of an infection and to resume once the infection has completely cleared. He has received 3 moderna covid-19 vaccine doses.  We discussed that ACR recommends receiving the fourth dose 5 months after the third dose.  Screening for tuberculosis -Future order for TB gold placed today.  Plan: QuantiFERON-TB Gold Plus  Pulmonary fibrosis (HCC) - Dr. Samuella Cota, NP-He continues to have intermittent SOB on exertion.  He has been more sedentary recently and has noticed increased weight gain which she feels has caused some increased shortness of breath on exertion.  He has not noticed any other new or worsening pulmonary symptoms.  He had a high-resolution chest CT on 12/21/2019: Appearance of lungs remains compatible with interstitial lung disease.  Overall spectrum of findings and relative stability compared to previous study strongly favored to reflect chronic fibrotic phase nonspecific interstitial pneumonia.  He has an upcoming appointment with Dr. Gwenlyn Perking this spring for routine follow-up.  He will continue on Imuran and prednisone as prescribed.  Osteopenia of multiple sites - Last DEXA on 12/19/17 showed T score of -1.6 at left femur neck  -5% change in BMD.  He is on long-term prednisone use.  He has not had any recent falls or fractures.  Future order for DEXA in place.    Other medical conditions are listed as follows:  History of bronchiectasis  History of gastroesophageal reflux (GERD)  History of congestive heart failure  History of coronary artery disease  History  of hyperlipidemia  History of hypertension  History of atrial fibrillation   Orders: Orders Placed This Encounter  Procedures  . QuantiFERON-TB Gold Plus   Meds ordered this encounter  Medications  . Clobetasol Prop Emollient Base 0.05 % emollient cream    Sig: APPLY TO AFFECTED AREA TWICE A DAY AS NEEDED    Dispense:  30 g    Refill:  2     Follow-Up Instructions: Return in about 5 months (around 09/18/2020) for Psoriatic arthritis.   Ofilia Neas, PA-C  Note - This record has been created using Dragon software.  Chart creation errors have been sought, but may not always  have been located. Such creation errors do not reflect on  the standard of medical care.

## 2020-04-18 ENCOUNTER — Telehealth: Payer: Self-pay | Admitting: Cardiovascular Disease

## 2020-04-18 ENCOUNTER — Other Ambulatory Visit: Payer: Self-pay | Admitting: Cardiovascular Disease

## 2020-04-18 NOTE — Telephone Encounter (Signed)
*  STAT* If patient is at the pharmacy, call can be transferred to refill team.   1. Which medications need to be refilled? (please list name of each medication and dose if known) atorvastatin (LIPITOR) 80 MG tablet  2. Which pharmacy/location (including street and city if local pharmacy) is medication to be sent to? [place], [place]  3. Do they need a 30 day or 90 day supply? 90 day supply

## 2020-04-21 ENCOUNTER — Encounter: Payer: Self-pay | Admitting: Physician Assistant

## 2020-04-21 ENCOUNTER — Telehealth: Payer: Self-pay

## 2020-04-21 ENCOUNTER — Ambulatory Visit (INDEPENDENT_AMBULATORY_CARE_PROVIDER_SITE_OTHER): Payer: Medicare HMO | Admitting: Physician Assistant

## 2020-04-21 ENCOUNTER — Other Ambulatory Visit: Payer: Self-pay

## 2020-04-21 VITALS — BP 144/87 | HR 57 | Resp 17 | Ht 67.0 in | Wt 196.0 lb

## 2020-04-21 DIAGNOSIS — M0579 Rheumatoid arthritis with rheumatoid factor of multiple sites without organ or systems involvement: Secondary | ICD-10-CM | POA: Diagnosis not present

## 2020-04-21 DIAGNOSIS — Z8719 Personal history of other diseases of the digestive system: Secondary | ICD-10-CM

## 2020-04-21 DIAGNOSIS — L4050 Arthropathic psoriasis, unspecified: Secondary | ICD-10-CM | POA: Diagnosis not present

## 2020-04-21 DIAGNOSIS — Z8679 Personal history of other diseases of the circulatory system: Secondary | ICD-10-CM

## 2020-04-21 DIAGNOSIS — M8589 Other specified disorders of bone density and structure, multiple sites: Secondary | ICD-10-CM

## 2020-04-21 DIAGNOSIS — Z8709 Personal history of other diseases of the respiratory system: Secondary | ICD-10-CM

## 2020-04-21 DIAGNOSIS — Z111 Encounter for screening for respiratory tuberculosis: Secondary | ICD-10-CM

## 2020-04-21 DIAGNOSIS — Z79899 Other long term (current) drug therapy: Secondary | ICD-10-CM | POA: Diagnosis not present

## 2020-04-21 DIAGNOSIS — L409 Psoriasis, unspecified: Secondary | ICD-10-CM

## 2020-04-21 DIAGNOSIS — Z8639 Personal history of other endocrine, nutritional and metabolic disease: Secondary | ICD-10-CM

## 2020-04-21 DIAGNOSIS — J8410 Pulmonary fibrosis, unspecified: Secondary | ICD-10-CM

## 2020-04-21 MED ORDER — CLOBETASOL PROP EMOLLIENT BASE 0.05 % EX CREA: TOPICAL_CREAM | CUTANEOUS | 2 refills | Status: DC

## 2020-04-21 NOTE — Telephone Encounter (Signed)
I attempted to contact patient and left message on machine to advise patient to call [facility] to schedule DEXA scan. Provided patient with phone number to schedule. I called [facility] and verified they have a valid order on file. [facility] has made 3 attempts to contact the patient to schedule.

## 2020-04-21 NOTE — Patient Instructions (Signed)
Standing Labs We placed an order today for your standing lab work.   Please have your standing labs drawn in April and every 3 months   If possible, please have your labs drawn 2 weeks prior to your appointment so that the provider can discuss your results at your appointment.  We have open lab daily Monday through Thursday from 1:30-4:30 PM and Friday from 1:30-4:00 PM at the office of Dr. Bo Merino, [facility].   Please be advised, all patients with office appointments requiring lab work will take precedents over walk-in lab work.  If possible, please come for your lab work on Monday and Friday afternoons, as you may experience shorter wait times. The office is located at [address] [place], [place], [place] [address] No appointment is necessary.   Labs are drawn by [facility]. Please bring your co-pay at the time of your lab draw.  You may receive a bill from [facility] for your lab work.  If you wish to have your labs drawn at another location, please call the office 24 hours in advance to send orders.  If you have any questions regarding directions or hours of operation,  please call [phone].   As a reminder, please drink plenty of water prior to coming for your lab work. Thanks!

## 2020-04-26 ENCOUNTER — Other Ambulatory Visit: Payer: Self-pay | Admitting: Cardiovascular Disease

## 2020-04-26 ENCOUNTER — Other Ambulatory Visit: Payer: Self-pay | Admitting: Nurse Practitioner

## 2020-05-04 ENCOUNTER — Telehealth: Payer: Self-pay | Admitting: Radiology

## 2020-05-04 NOTE — Telephone Encounter (Signed)
Received DEXA results from [facility].  Date of Scan: 04/28/2020  Lowest T-score and lowest site measured: Left femoral neck T-score: -0.8, BMD: 0.827   Significant changes in BMD and site measured (5% and above): 5% change R total femur  Current Regimen: N/A  Recommendation: N/A  Reviewed by: Hazel Sams, PA-C  Next Appointment: 09/22/2020

## 2020-05-05 ENCOUNTER — Other Ambulatory Visit: Payer: Self-pay | Admitting: *Deleted

## 2020-05-05 DIAGNOSIS — L409 Psoriasis, unspecified: Secondary | ICD-10-CM

## 2020-05-05 DIAGNOSIS — M0579 Rheumatoid arthritis with rheumatoid factor of multiple sites without organ or systems involvement: Secondary | ICD-10-CM

## 2020-05-05 DIAGNOSIS — L4050 Arthropathic psoriasis, unspecified: Secondary | ICD-10-CM

## 2020-05-05 MED ORDER — ORENCIA CLICKJECT 125 MG/ML ~~LOC~~ SOAJ: 125.0000 mg | SUBCUTANEOUS | 0 refills | Status: DC

## 2020-05-05 NOTE — Telephone Encounter (Signed)
RX faxed from [facility] Visit: 04/21/2020 Next Visit: 09/22/2020 Labs: 03/22/2020, WBC 12.0, Neutro Abs 7.9,  TB Gold: 05/26/2020, negative  Current Dose per office note 04/21/2020, Orencia 125 mg sq weekly injections   DX: Psoriatic arthritis   Last Fill: 12/31/2019  Okay to refill Orencia?

## 2020-05-08 ENCOUNTER — Other Ambulatory Visit: Payer: Self-pay | Admitting: Cardiovascular Disease

## 2020-05-08 ENCOUNTER — Other Ambulatory Visit: Payer: Self-pay | Admitting: Rheumatology

## 2020-05-09 MED ORDER — ORENCIA CLICKJECT 125 MG/ML ~~LOC~~ SOAJ: 125.0000 mg | SUBCUTANEOUS | 0 refills | Status: DC

## 2020-05-09 NOTE — Addendum Note (Signed)
Addended by: Cassandria Anger on: 05/09/2020 03:04 PM   Modules accepted: Orders

## 2020-05-15 ENCOUNTER — Other Ambulatory Visit: Payer: Self-pay

## 2020-05-15 ENCOUNTER — Other Ambulatory Visit (HOSPITAL_COMMUNITY)
Admission: RE | Admit: 2020-05-15 | Discharge: 2020-05-15 | Disposition: A | Discharge status: Home / Self Care | Payer: Medicare HMO | Source: Ambulatory Visit | Attending: Pulmonary Disease | Admitting: Pulmonary Disease

## 2020-05-15 DIAGNOSIS — Z01812 Encounter for preprocedural laboratory examination: Secondary | ICD-10-CM | POA: Diagnosis present

## 2020-05-15 DIAGNOSIS — Z20822 Contact with and (suspected) exposure to covid-19: Secondary | ICD-10-CM | POA: Diagnosis not present

## 2020-05-15 LAB — SARS CORONAVIRUS 2 (TAT 6-24 HRS): SARS Coronavirus 2: NEGATIVE

## 2020-05-16 ENCOUNTER — Other Ambulatory Visit: Payer: Self-pay

## 2020-05-16 ENCOUNTER — Ambulatory Visit (HOSPITAL_COMMUNITY)
Admission: RE | Admit: 2020-05-16 | Discharge: 2020-05-16 | Disposition: A | Discharge status: Home / Self Care | Payer: Medicare HMO | Source: Ambulatory Visit | Attending: Pulmonary Disease | Admitting: Pulmonary Disease

## 2020-05-16 DIAGNOSIS — J8410 Pulmonary fibrosis, unspecified: Secondary | ICD-10-CM | POA: Insufficient documentation

## 2020-05-16 LAB — PULMONARY FUNCTION TEST
DL/VA % pred: 92 %
DL/VA: 3.85 ml/min/mmHg/L
DLCO unc % pred: 48 %
DLCO unc: 11.5 ml/min/mmHg
FEF 25-75 Pre: 1.1 L/sec
FEF2575-%Pred-Pre: 48 %
FEV1-%Pred-Pre: 57 %
FEV1-Pre: 1.46 L
FEV1FVC-%Pred-Pre: 98 %
FEV6-%Pred-Pre: 59 %
FEV6-Pre: 1.92 L
FEV6FVC-%Pred-Pre: 104 %
FVC-%Pred-Pre: 57 %
FVC-Pre: 1.93 L
Pre FEV1/FVC ratio: 76 %
Pre FEV6/FVC Ratio: 100 %
RV % pred: 65 %
RV: 1.43 L
TLC % pred: 55 %
TLC: 3.47 L

## 2020-05-16 MED ORDER — ALBUTEROL SULFATE (2.5 MG/3ML) 0.083% IN NEBU: 2.5000 mg | INHALATION_SOLUTION | Freq: Once | RESPIRATORY_TRACT | Status: DC

## 2020-05-20 ENCOUNTER — Other Ambulatory Visit: Payer: Self-pay | Admitting: Physician Assistant

## 2020-05-20 DIAGNOSIS — D508 Other iron deficiency anemias: Secondary | ICD-10-CM

## 2020-05-22 NOTE — Telephone Encounter (Signed)
Next Visit: 09/22/2020  Last Visit:  04/21/2020,   Last Fill: 03/13/2020  DX: Psoriatic arthritis   Current Dose per office note 04/21/2020, imuran 50 mg 2 tablets by mouth daily, and prednisone 5 mg daily.   Labs: 03/22/2020, WBC 12.0, Neutro Abs 7.9, MPV 12.7  Okay to refill Imuran and Prednisone?

## 2020-06-02 ENCOUNTER — Other Ambulatory Visit: Payer: Self-pay | Admitting: *Deleted

## 2020-06-02 ENCOUNTER — Other Ambulatory Visit: Payer: Self-pay | Admitting: Cardiovascular Disease

## 2020-06-03 ENCOUNTER — Other Ambulatory Visit: Payer: Self-pay | Admitting: Cardiovascular Disease

## 2020-06-04 ENCOUNTER — Other Ambulatory Visit: Payer: Self-pay | Admitting: Cardiovascular Disease

## 2020-06-14 ENCOUNTER — Other Ambulatory Visit: Payer: Self-pay

## 2020-06-14 ENCOUNTER — Ambulatory Visit (INDEPENDENT_AMBULATORY_CARE_PROVIDER_SITE_OTHER): Payer: Medicare HMO | Admitting: Pulmonary Disease

## 2020-06-14 ENCOUNTER — Encounter: Payer: Self-pay | Admitting: Pulmonary Disease

## 2020-06-14 DIAGNOSIS — M05731 Rheumatoid arthritis with rheumatoid factor of right wrist without organ or systems involvement: Secondary | ICD-10-CM | POA: Diagnosis not present

## 2020-06-14 DIAGNOSIS — J8410 Pulmonary fibrosis, unspecified: Secondary | ICD-10-CM | POA: Diagnosis not present

## 2020-06-14 DIAGNOSIS — M05732 Rheumatoid arthritis with rheumatoid factor of left wrist without organ or systems involvement: Secondary | ICD-10-CM | POA: Diagnosis not present

## 2020-06-14 NOTE — Patient Instructions (Signed)
  Lung function is stable

## 2020-06-14 NOTE — Progress Notes (Signed)
   Subjective:    Patient ID: Curtis Flores, male    DOB: 11-08-1955, 65 y.o.   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  Breathing is stable, he denies cough or wheezing. Psoriatic skin lesions are controlled. 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    PFTs 2013 showed intraprenchymal restricton with FVC around 71% &DLCO 56% c/w ILD, no airway obstruction.  Rpt PFTs 10/2012 show mild drop in FVC to 66%, DLCO preserved   PFT 05/2015 FEV1 70%, ratio 82, FVC 66%, no BD response, DLCO 46%. DLCO has decreased from 2014.   PFT 12/2015 >> FVC 71%, DLCO 45% unc  PFTs 03/2018 show drop in FVC to 53%, TLC to 49%, DLCO maintained at 41%  PFTs 4/277moderate restriction, ratio 83, FVC 53%, DLCO stable at 11.8/28%  PFTs 05/2020 stabl, FVC 1.93/57%, TLC 55%, DLCO 11.50 /48 %    Review of Systems neg for any significant sore throat, dysphagia, itching, sneezing, nasal congestion or excess/ purulent secretions, fever, chills, sweats, unintended wt loss, pleuritic or exertional cp, hempoptysis,  orthopnea pnd or change in chronic leg swelling. Also denies presyncope, palpitations, heartburn, abdominal pain, nausea, vomiting, diarrhea or change in bowel or urinary habits, dysuria,hematuria, rash, arthralgias, visual complaints, headache, numbness weakness or ataxia.     Objective:   Physical Exam  Gen. Pleasant, well-nourished, in no distress ENT - no thrush, no pallor/icterus,no post nasal drip, scalp psoriasis Neck: No JVD, no thyromegaly, no carotid bruits Lungs: no use of accessory muscles, no dullness to percussion, bibasal 1/3 rales no rhonchi  Cardiovascular: Rhythm regular, heart sounds  normal, no murmurs or gallops, no peripheral edema Musculoskeletal: No deformities, no cyanosis or clubbing        Assessment & Plan:

## 2020-06-14 NOTE — Assessment & Plan Note (Signed)
Arthritis symptoms are also well controlled, low-dose prednisone seems to work well in combination with Maureen Chatters and Imuran

## 2020-06-14 NOTE — Assessment & Plan Note (Signed)
Lung function is stable with suggests that disease is well controlled on current doses of medications.  His arthritis symptoms are also well controlled. We will follow him with 6 monthly clinical assessments and chest x-ray once a year

## 2020-06-26 MED ORDER — ATORVASTATIN CALCIUM 80 MG PO TABS: ORAL_TABLET | ORAL | 0 refills | Status: DC

## 2020-06-26 NOTE — Telephone Encounter (Signed)
Rx(s) sent to pharmacy electronically.  

## 2020-06-26 NOTE — Telephone Encounter (Signed)
*  STAT* If patient is at the pharmacy, call can be transferred to refill team.   1. Which medications need to be refilled? (please list name of each medication and dose if known) atorvastatin (LIPITOR) 80 MG tablet  2. Which pharmacy/location (including street and city if local pharmacy) is medication to be sent to? [place], [place]  3. Do they need a 30 day or 90 day supply? 90 day  Patient is out of medication

## 2020-06-26 NOTE — Addendum Note (Signed)
Addended by: Fidel Levy on: 06/26/2020 12:00 PM   Modules accepted: Orders

## 2020-07-03 ENCOUNTER — Other Ambulatory Visit: Payer: Self-pay | Admitting: Cardiovascular Disease

## 2020-07-07 ENCOUNTER — Other Ambulatory Visit: Payer: Self-pay

## 2020-07-07 ENCOUNTER — Encounter: Payer: Self-pay | Admitting: Cardiovascular Disease

## 2020-07-07 ENCOUNTER — Ambulatory Visit (INDEPENDENT_AMBULATORY_CARE_PROVIDER_SITE_OTHER): Payer: Medicare HMO | Admitting: Cardiovascular Disease

## 2020-07-07 VITALS — BP 138/62 | HR 51 | Ht 66.5 in | Wt 191.2 lb

## 2020-07-07 DIAGNOSIS — E785 Hyperlipidemia, unspecified: Secondary | ICD-10-CM | POA: Diagnosis not present

## 2020-07-07 DIAGNOSIS — I2510 Atherosclerotic heart disease of native coronary artery without angina pectoris: Secondary | ICD-10-CM | POA: Diagnosis not present

## 2020-07-07 DIAGNOSIS — I255 Ischemic cardiomyopathy: Secondary | ICD-10-CM

## 2020-07-07 DIAGNOSIS — I10 Essential (primary) hypertension: Secondary | ICD-10-CM

## 2020-07-07 DIAGNOSIS — I480 Paroxysmal atrial fibrillation: Secondary | ICD-10-CM | POA: Diagnosis not present

## 2020-07-07 DIAGNOSIS — Z9861 Coronary angioplasty status: Secondary | ICD-10-CM

## 2020-07-07 MED ORDER — FUROSEMIDE 40 MG PO TABS: ORAL_TABLET | ORAL | 3 refills | Status: DC

## 2020-07-07 NOTE — Assessment & Plan Note (Signed)
History of CAD status post inferolateral STEMI 02/22/2015.  I took him to the Cath Lab and stented his proximal dominant circumflex and first obtuse marginal branch with drug-eluting stents (T stenting).  He had residual 90% mid nondominant RCA and 80% proximal LAD stenosis.  His EF was 35 to 40% at that time.  He was hospitalized with a non-STEMI 05/09/2018 and underwent cardiac catheterization by Dr. Tamala Julian revealing "in-stent restenosis" within the proximal circumflex T stent.  He underwent Cutting Balloon atherectomy.  He has been stable since on Plavix.

## 2020-07-07 NOTE — Assessment & Plan Note (Signed)
History of PA flutter status post ablation by Dr. Rayann Heman 05/30/2015 maintaining sinus rhythm on sotalol and Eliquis.

## 2020-07-07 NOTE — Assessment & Plan Note (Signed)
History of dyslipidemia on statin therapy followed by his PCP.  We will recheck a lipid liver profile.  His last profile in our chart performed 05/09/2018 revealed a total cholesterol 130, LDL 71 and HDL 50.

## 2020-07-07 NOTE — Patient Instructions (Signed)
Medication Instructions:  Your physician recommends that you continue on your current medications as directed. Please refer to the Current Medication list given to you today.  *If you need a refill on your cardiac medications before your next appointment, please call your pharmacy*   Lab Work: Your physician recommends that you return for lab work in: the next week or so for fasting lipid/liver profile.  If you have labs (blood work) drawn today and your tests are completely normal, you will receive your results only by: [name] MyChart Message (if you have MyChart) OR . A paper copy in the mail If you have any lab test that is abnormal or we need to change your treatment, we will call you to review the results.   Follow-Up: At [facility], you and your health needs are our priority.  As part of our continuing mission to provide you with exceptional heart care, we have created designated Provider Care Teams.  These Care Teams include your primary Cardiologist (physician) and Advanced Practice Providers (APPs -  Physician Assistants and Nurse Practitioners) who all work together to provide you with the care you need, when you need it.  We recommend signing up for the patient portal called "MyChart".  Sign up information is provided on this After Visit Summary.  MyChart is used to connect with patients for Virtual Visits (Telemedicine).  Patients are able to view lab/test results, encounter notes, upcoming appointments, etc.  Non-urgent messages can be sent to your provider as well.   To learn more about what you can do with MyChart, go to [email or web address].    Your next appointment:   12 month(s)  The format for your next appointment:   In Person  Provider:   Quay Burow, MD

## 2020-07-07 NOTE — Assessment & Plan Note (Signed)
History of essential hypertension blood pressure measured today 180/68.  At home he says his blood pressure is much better in the 130/50 range.  He is on amlodipine, and losartan.

## 2020-07-07 NOTE — Progress Notes (Signed)
07/07/2020 Curtis Flores   1955-06-23  [id number]  Primary Physician Chesley Noon, MD Primary Cardiologist: Lorretta Harp MD Lupe Carney, [place]  HPI:  Curtis Flores is a 65 y.o.  moderately overweight, married African-American male father of 2, grandfather to 3 grandchildren accompanied by his wifeSandratoday. I last saw him in the office 02/05/2019.Curtis KitchenHis primary care physician is Dr. Edrick Oh in medicine. I recently saw him in the office 03/07/15. He has a remote history of tobacco abuse as well as a history of hypertension and hyperlipidemia rheumatoid arthritis. He had an inferolateral STEMI on 02/22/15. I took him to the Cath Lab and stented his proximal dominant circumflex and first obtuse marginal branch with drug-eluting stents (T stenting). He had residual 90% stenosis and a mid nondominant RCA 80% proximal LAD stenosis. His EF was 35-40% by 2-D echo.Subsequent Myoview stress testing performed soon thereafter was nonischemic.Curtis Flores There is a history of pulmonary fibrosis however as well. When I saw him in the office on 04/05/15 he was significantly dyspneic and tachycardic. I initially thought that this was sinus tachycardia however he was admitted to the hospital 2 days later with progressive dyspnea and he was diagnosed with atrial flutter. He did have a wide complex tachycardia prior to that which converted with venous adenosine. He UNDERWENT transesophageal echo/DC cardioversion successfully was has resulted in marked improvement in his symptoms. His EF at that time was 20%. He was put on oral anticoagulations andBrilentawas changed to Plavix.He also underwent a flutter ablation by Dr. Rayann Heman on 05/30/15 and was taken off his amiodarone.  He was hospitalized 05/09/2018 with non-STEMI.  He underwent cardiac catheterization by Dr. Tamala Julian demonstrating "in-stent restenosis within the proximal circumflex "T stent" and underwent Cutting Balloon atherectomy.  He did develop a flutter prior  to his hospitalization but converted to sinus rhythm and was begun on sotalol by Dr. Rayann Heman.  He continues to maintain sinus rhythm/sinus bradycardia on Eliquis oral anticoagulation.  He does have pulmonary fibrosis followed by Dr. Elsworth Soho.  Since I saw him a year and a half ago he continues to remain stable.  He still sees Dr. Elsworth Soho for his pulmonary fibrosis.  He denies chest pain or shortness of breath.   Current Meds  Medication Sig  . Abatacept (ORENCIA CLICKJECT) 160 MG/ML SOAJ Inject 125 mg into the skin once a week.  Curtis Flores amLODipine (NORVASC) 5 MG tablet TAKE 1 TABLET EVERY DAY (NEED MD APPOINTMENT)  . atorvastatin (LIPITOR) 80 MG tablet TAKE 1 TABLET (80 MG TOTAL) BY MOUTH DAILY AT 6 PM.  . azaTHIOprine (IMURAN) 50 MG tablet Take 1 tablet (50 mg total) by mouth 2 (two) times daily.  . Clobetasol Prop Emollient Base 0.05 % emollient cream APPLY TO AFFECTED AREA TWICE A DAY AS NEEDED  . clopidogrel (PLAVIX) 75 MG tablet TAKE 1 TABLET (75 MG TOTAL) BY MOUTH DAILY.  Curtis Flores ELIQUIS 5 MG TABS tablet TAKE 1 TABLET BY MOUTH TWICE A DAY  . losartan (COZAAR) 100 MG tablet TAKE 1 TABLET EVERY DAY. PLEASE SCHEDULE APPOINTMENT FOR FURTHER REFILLS.  Curtis Flores pantoprazole (PROTONIX) 40 MG tablet TAKE 1 TABLET TWICE DAILY  . potassium chloride SA (KLOR-CON) 20 MEQ tablet TAKE 2 TABLETS (40 MEQ TOTAL) BY MOUTH 2 (TWO) TIMES DAILY.  Curtis Flores predniSONE (DELTASONE) 5 MG tablet TAKE 1 TABLET BY MOUTH EVERY DAY WITH BREAKFAST  . sotalol (BETAPACE) 120 MG tablet TAKE 1 TABLET EVERY 12 HOURS  . [DISCONTINUED] furosemide (LASIX) 40 MG tablet TAKE  1 TABLET TWICE DAILY (NEED MD APPOINTMENT)     No Known Allergies  Social History   Socioeconomic History  . Marital status: Married    Spouse name: Not on file  . Number of children: 2  . Years of education: Not on file  . Highest education level: Not on file  Occupational History  . 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 Smoking status: Former Smoker    Packs/day: 1.00    Years: 30.00    Pack years: 30.00    Types: Cigarettes    Quit date: 03/21/2003    Years since quitting: 17.3  . Smokeless tobacco: Never Used  Vaping Use  . Vaping Use: Never used  Substance and Sexual Activity  . Alcohol use: No    Alcohol/week: 0.0 standard drinks    Comment: H/O case of beer weekly x 20 years, quiting in 2000-ish.  . Drug use: No    Comment: H/O marijuana use many years ago.  Curtis Flores Sexual activity: Yes    Birth control/protection: None  Other Topics Concern  . Not on file  Social History Narrative  . Not on file   Social Determinants of Health   Financial Resource Strain: Not on file  Food Insecurity: Not on file  Transportation Needs: Not on file  Physical Activity: Not on file  Stress: Not on file  Social Connections: Not on file  Intimate Partner Violence: Not on file     Review of Systems: General: negative for chills, fever, night sweats or weight changes.  Cardiovascular: negative for chest pain, dyspnea on exertion, edema, orthopnea, palpitations, paroxysmal nocturnal dyspnea or shortness of breath Dermatological: negative for rash Respiratory: negative for cough or wheezing Urologic: negative for hematuria Abdominal: negative for nausea, vomiting, diarrhea, bright red blood per rectum, melena, or hematemesis Neurologic: negative for visual changes, syncope, or dizziness All other systems reviewed and are otherwise negative except as noted above.    Blood pressure (!) 180/68, pulse (!) 51, height 5' 6.5" (1.689 m), weight 191 lb 3.2 oz (86.7 kg), SpO2 98 %.  General appearance: alert and no distress Neck: no adenopathy, no carotid bruit, no JVD, supple, symmetrical, trachea midline and thyroid not enlarged, symmetric, no tenderness/mass/nodules Lungs: clear to auscultation bilaterally Heart: regular rate and rhythm, S1, S2 normal, no murmur, click, rub or  gallop Extremities: extremities normal, atraumatic, no cyanosis or edema Pulses: 2+ and symmetric Skin: Skin color, texture, turgor normal. No rashes or lesions Neurologic: Alert and oriented X 3, normal strength and tone. Normal symmetric reflexes. Normal coordination and gait  EKG sinus bradycardia 51 with low bundle branch block.  I personally reviewed this EKG.  ASSESSMENT AND PLAN:   Dyslipidemia, goal LDL below 70 History of dyslipidemia on statin therapy followed by his PCP.  We will recheck a lipid liver profile.  His last profile in our chart performed 05/09/2018 revealed a total cholesterol 130, LDL 71 and HDL 50.  CAD S/P percutaneous coronary angioplasty History of CAD status post inferolateral STEMI 02/22/2015.  I took him to the Cath Lab and stented his proximal dominant circumflex and first obtuse marginal branch with drug-eluting stents (T stenting).  He had residual 90% mid nondominant RCA and 80% proximal LAD stenosis.  His EF was 35 to 40% at that time.  He was hospitalized with a non-STEMI 05/09/2018 and underwent cardiac catheterization by Dr.  Tamala Julian revealing "in-stent restenosis" within the proximal circumflex T stent.  He underwent Cutting Balloon atherectomy.  He has been stable since on Plavix.  PAF (paroxysmal atrial fibrillation) (HCC) History of PA flutter status post ablation by Dr. Rayann Heman 05/30/2015 maintaining sinus rhythm on sotalol and Eliquis.  Essential hypertension History of essential hypertension blood pressure measured today 180/68.  At home he says his blood pressure is much better in the 130/50 range.  He is on amlodipine, and losartan.  Ischemic cardiomyopathy History of ischemic cardiomyopathy with last 2D echo performed 05/09/2018 revealing EF in the 40 to 45% range without valvular abnormalities.      Lorretta Harp MD FACP,FACC,FAHA, [facility] 07/07/2020 11:41 AM

## 2020-07-07 NOTE — Assessment & Plan Note (Signed)
History of ischemic cardiomyopathy with last 2D echo performed 05/09/2018 revealing EF in the 40 to 45% range without valvular abnormalities.

## 2020-07-19 ENCOUNTER — Other Ambulatory Visit: Payer: Self-pay | Admitting: Cardiovascular Disease

## 2020-07-19 ENCOUNTER — Other Ambulatory Visit (HOSPITAL_COMMUNITY)
Admission: RE | Admit: 2020-07-19 | Discharge: 2020-07-19 | Disposition: A | Discharge status: Home / Self Care | Payer: Medicare HMO | Source: Ambulatory Visit | Attending: Cardiovascular Disease | Admitting: Cardiovascular Disease

## 2020-07-19 ENCOUNTER — Other Ambulatory Visit: Payer: Self-pay

## 2020-07-19 DIAGNOSIS — E785 Hyperlipidemia, unspecified: Secondary | ICD-10-CM | POA: Insufficient documentation

## 2020-07-19 LAB — HEPATIC FUNCTION PANEL
ALT: 26 U/L (ref 0–44)
AST: 31 U/L (ref 15–41)
Albumin: 3.8 g/dL (ref 3.5–5.0)
Alkaline Phosphatase: 64 U/L (ref 38–126)
Bilirubin, Direct: 0.1 mg/dL (ref 0.0–0.2)
Indirect Bilirubin: 1.1 mg/dL — ABNORMAL HIGH (ref 0.3–0.9)
Total Bilirubin: 1.2 mg/dL (ref 0.3–1.2)
Total Protein: 7.5 g/dL (ref 6.5–8.1)

## 2020-07-19 LAB — LIPID PANEL
Cholesterol: 140 mg/dL (ref 0–200)
HDL: 49 mg/dL (ref >40)
LDL Cholesterol: 76 mg/dL (ref 0–99)
Total CHOL/HDL Ratio: 2.9 RATIO
Triglycerides: 73 mg/dL (ref <150)
VLDL: 15 mg/dL (ref 0–40)

## 2020-07-25 ENCOUNTER — Telehealth: Payer: Self-pay | Admitting: Cardiovascular Disease

## 2020-07-25 ENCOUNTER — Encounter: Payer: Self-pay | Admitting: *Deleted

## 2020-07-25 NOTE — Telephone Encounter (Signed)
    Pt's wife calling to get lab result

## 2020-07-25 NOTE — Telephone Encounter (Signed)
Spoke with pt wife, aware labs were excellent and copy was mailed to them.

## 2020-07-27 ENCOUNTER — Other Ambulatory Visit: Payer: Self-pay | Admitting: Physician Assistant

## 2020-07-27 DIAGNOSIS — L4050 Arthropathic psoriasis, unspecified: Secondary | ICD-10-CM

## 2020-07-27 DIAGNOSIS — M0579 Rheumatoid arthritis with rheumatoid factor of multiple sites without organ or systems involvement: Secondary | ICD-10-CM

## 2020-07-27 DIAGNOSIS — Z79899 Other long term (current) drug therapy: Secondary | ICD-10-CM

## 2020-07-27 DIAGNOSIS — D508 Other iron deficiency anemias: Secondary | ICD-10-CM

## 2020-07-27 DIAGNOSIS — L409 Psoriasis, unspecified: Secondary | ICD-10-CM

## 2020-07-27 NOTE — Addendum Note (Signed)
Addended by: Shona Needles on: 07/27/2020 05:06 PM   Modules accepted: Orders

## 2020-07-27 NOTE — Telephone Encounter (Addendum)
Next Visit: 09/22/2020  Last Visit: 04/21/2020  Last Fill: 05/22/2020  DX: Psoriatic arthritis   Current Dose per office note 04/21/2020, Imuran 50 mg 2 tablets by mouth daily, and prednisone 5 mg by mouth daily  Labs: 03/14/2020, WBC count is elevated. Absolute neutrophils are elevated but stable. CMP WNL. We will continue to monitor.  I called patient, patient will have labs drawn 08/02/2020.  Okay to refill Imuran and prednisone?

## 2020-07-28 ENCOUNTER — Other Ambulatory Visit: Payer: Self-pay | Admitting: *Deleted

## 2020-07-28 DIAGNOSIS — M0579 Rheumatoid arthritis with rheumatoid factor of multiple sites without organ or systems involvement: Secondary | ICD-10-CM

## 2020-07-28 DIAGNOSIS — L409 Psoriasis, unspecified: Secondary | ICD-10-CM

## 2020-07-28 DIAGNOSIS — L4050 Arthropathic psoriasis, unspecified: Secondary | ICD-10-CM

## 2020-07-28 MED ORDER — ORENCIA CLICKJECT 125 MG/ML ~~LOC~~ SOAJ: 125.0000 mg | SUBCUTANEOUS | 0 refills | Status: DC

## 2020-07-28 NOTE — Telephone Encounter (Signed)
Patient needs to be followed with the next labs.

## 2020-07-28 NOTE — Telephone Encounter (Signed)
Next Visit: 09/22/2020  Last Visit: 04/21/2020  Last Fill: 05/09/2020  DX: Psoriatic arthritis   Current Dose per office note 04/21/2020, Orencia 125 mg subcu injections once weekly  Labs: 03/22/2020,  WBC count is elevated. Absolute neutrophils are elevated but stable. CMP WNL. We will continue to monitor.  I called patient, patient will have labs drawn 08/02/2020  TB Gold:  05/27/2019, negative  Okay to refill Orencia?

## 2020-07-28 NOTE — Telephone Encounter (Signed)
Patient will have labs drawn on 08/03/2020 at [facility], please release labs on 08/01/2020. Thank you.

## 2020-08-01 ENCOUNTER — Other Ambulatory Visit: Payer: Self-pay | Admitting: *Deleted

## 2020-08-01 DIAGNOSIS — L409 Psoriasis, unspecified: Secondary | ICD-10-CM

## 2020-08-01 DIAGNOSIS — Z111 Encounter for screening for respiratory tuberculosis: Secondary | ICD-10-CM

## 2020-08-01 DIAGNOSIS — Z79899 Other long term (current) drug therapy: Secondary | ICD-10-CM

## 2020-08-01 DIAGNOSIS — M0579 Rheumatoid arthritis with rheumatoid factor of multiple sites without organ or systems involvement: Secondary | ICD-10-CM

## 2020-08-01 DIAGNOSIS — L4050 Arthropathic psoriasis, unspecified: Secondary | ICD-10-CM

## 2020-08-01 NOTE — Addendum Note (Signed)
Addended by: Shona Needles on: 08/01/2020 11:09 AM   Modules accepted: Orders

## 2020-08-01 NOTE — Telephone Encounter (Signed)
Labs released.  

## 2020-08-02 ENCOUNTER — Other Ambulatory Visit (HOSPITAL_COMMUNITY)
Admission: RE | Admit: 2020-08-02 | Discharge: 2020-08-02 | Disposition: A | Discharge status: Home / Self Care | Payer: Medicare HMO | Source: Ambulatory Visit | Attending: Physician Assistant | Admitting: Physician Assistant

## 2020-08-02 ENCOUNTER — Other Ambulatory Visit: Payer: Self-pay

## 2020-08-02 DIAGNOSIS — Z79899 Other long term (current) drug therapy: Secondary | ICD-10-CM | POA: Diagnosis present

## 2020-08-02 DIAGNOSIS — L409 Psoriasis, unspecified: Secondary | ICD-10-CM | POA: Insufficient documentation

## 2020-08-02 DIAGNOSIS — L4050 Arthropathic psoriasis, unspecified: Secondary | ICD-10-CM | POA: Insufficient documentation

## 2020-08-02 DIAGNOSIS — M0579 Rheumatoid arthritis with rheumatoid factor of multiple sites without organ or systems involvement: Secondary | ICD-10-CM | POA: Diagnosis present

## 2020-08-02 LAB — COMPREHENSIVE METABOLIC PANEL
ALT: 24 U/L (ref 0–44)
AST: 27 U/L (ref 15–41)
Albumin: 3.7 g/dL (ref 3.5–5.0)
Alkaline Phosphatase: 57 U/L (ref 38–126)
Anion gap: 6 (ref 5–15)
BUN: 14 mg/dL (ref 8–23)
CO2: 32 mmol/L (ref 22–32)
Calcium: 9.3 mg/dL (ref 8.9–10.3)
Chloride: 102 mmol/L (ref 98–111)
Creatinine, Ser: 0.69 mg/dL (ref 0.61–1.24)
GFR, Estimated: >60 mL/min (ref >60)
Glucose, Bld: 99 mg/dL (ref 70–99)
Potassium: 4.5 mmol/L (ref 3.5–5.1)
Sodium: 140 mmol/L (ref 135–145)
Total Bilirubin: 0.6 mg/dL (ref 0.3–1.2)
Total Protein: 6.8 g/dL (ref 6.5–8.1)

## 2020-08-02 LAB — CBC WITH DIFFERENTIAL/PLATELET
Abs Immature Granulocytes: 0.07 10*3/uL (ref 0.00–0.07)
Basophils Absolute: 0.1 10*3/uL (ref 0.0–0.1)
Basophils Relative: 1 %
Eosinophils Absolute: 0.1 10*3/uL (ref 0.0–0.5)
Eosinophils Relative: 1 %
HCT: 48.7 % (ref 39.0–52.0)
Hemoglobin: 15.4 g/dL (ref 13.0–17.0)
Immature Granulocytes: 1 %
Lymphocytes Relative: 21 %
Lymphs Abs: 2.8 10*3/uL (ref 0.7–4.0)
MCH: 28.8 pg (ref 26.0–34.0)
MCHC: 31.6 g/dL (ref 30.0–36.0)
MCV: 91 fL (ref 80.0–100.0)
Monocytes Absolute: 0.7 10*3/uL (ref 0.1–1.0)
Monocytes Relative: 5 %
Neutro Abs: 9.5 10*3/uL — ABNORMAL HIGH (ref 1.7–7.7)
Neutrophils Relative %: 71 %
Platelets: 216 10*3/uL (ref 150–400)
RBC: 5.35 MIL/uL (ref 4.22–5.81)
RDW: 12.9 % (ref 11.5–15.5)
WBC: 13.2 10*3/uL — ABNORMAL HIGH (ref 4.0–10.5)
nRBC: 0 % (ref 0.0–0.2)

## 2020-08-02 NOTE — Progress Notes (Signed)
WBC count remains elevated.  Absolute neutrophils remain elevated but stable. Rest of CBC WNL.  CMP WNL.  Please forward lab work to PCP.

## 2020-08-04 LAB — QUANTIFERON-TB GOLD PLUS (RQFGPL)
QuantiFERON Mitogen Value: >10 IU/mL
QuantiFERON Nil Value: 0.21 IU/mL
QuantiFERON TB1 Ag Value: 0.2 IU/mL
QuantiFERON TB2 Ag Value: 0.8 IU/mL

## 2020-08-04 LAB — QUANTIFERON-TB GOLD PLUS: QuantiFERON-TB Gold Plus: POSITIVE — AB

## 2020-08-07 ENCOUNTER — Telehealth: Payer: Self-pay | Admitting: *Deleted

## 2020-08-07 DIAGNOSIS — R7612 Nonspecific reaction to cell mediated immunity measurement of gamma interferon antigen response without active tuberculosis: Secondary | ICD-10-CM

## 2020-08-07 NOTE — Telephone Encounter (Signed)
-----   Message from Ofilia Neas, PA-C sent at 08/07/2020 12:11 PM EDT ----- TB gold is positive.   Please notify the patient and advise the patient to hold Orencia and Imuran.  He can continue on prednisone.   Please place urgent referral to ID for further evaluation.

## 2020-08-07 NOTE — Progress Notes (Signed)
TB gold is positive.   Please notify the patient and advise the patient to hold Orencia and Imuran.  He can continue on prednisone.   Please place urgent referral to ID for further evaluation.

## 2020-08-09 DIAGNOSIS — Z79899 Other long term (current) drug therapy: Secondary | ICD-10-CM | POA: Insufficient documentation

## 2020-08-15 ENCOUNTER — Other Ambulatory Visit: Payer: Self-pay | Admitting: Infectious Diseases

## 2020-08-15 ENCOUNTER — Other Ambulatory Visit: Payer: Self-pay

## 2020-08-15 ENCOUNTER — Ambulatory Visit (INDEPENDENT_AMBULATORY_CARE_PROVIDER_SITE_OTHER): Payer: Medicare HMO | Admitting: Infectious Diseases

## 2020-08-15 ENCOUNTER — Ambulatory Visit
Admission: RE | Admit: 2020-08-15 | Discharge: 2020-08-15 | Disposition: A | Discharge status: Home / Self Care | Payer: Medicare HMO | Source: Ambulatory Visit | Attending: Infectious Diseases | Admitting: Infectious Diseases

## 2020-08-15 VITALS — BP 165/66 | HR 44 | Temp 97.8°F | Resp 18 | Ht 67.0 in | Wt 190.6 lb

## 2020-08-15 DIAGNOSIS — R059 Cough, unspecified: Secondary | ICD-10-CM

## 2020-08-15 DIAGNOSIS — R7612 Nonspecific reaction to cell mediated immunity measurement of gamma interferon antigen response without active tuberculosis: Secondary | ICD-10-CM

## 2020-08-15 DIAGNOSIS — J849 Interstitial pulmonary disease, unspecified: Secondary | ICD-10-CM

## 2020-08-15 DIAGNOSIS — Z79899 Other long term (current) drug therapy: Secondary | ICD-10-CM

## 2020-08-15 NOTE — Progress Notes (Signed)
[facility]                                                             [place], [place], [place], [address]                                                                  Phn. [phone]; Fax: [phone]                                                                             Date: 08/15/20  Reason for Referral: Positive Quantiferon  Requesting  Provider: Bo Merino   Assessment Problem List Items Addressed This Visit       Other   High risk medication use (Chronic)   Other Visit Diagnoses     Interstitial pulmonary disease ([place])    -  Primary   Relevant Orders   DG Chest 2 View   HIV antibody (with reflex)   Hepatitis B core antibody, total   Hepatitis B surface antibody,qualitative   Hepatitis B surface antigen   Hepatitis B core antibody, IgM   Hepatitis C antibody   Positive QuantiFERON-TB Gold test           Positive quantiferon test- 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 Orencia and Imuran on hold for above reasons  Pulmonary Fibrosis/ILD Follows with Pulmonary   CAD and Ischemic Cadiomyopathy/CHF PAF  Follows with Cardiology   Plan Will get Chest xray and possibly CT chest afterwards given patient has pre-existing abnormalities in prior CT chest  HIV and hepatitis serology Fu in 1 week for discussion of results and treatment planning   All questions and concerns were discussed and addressed. Patient verbalized understanding of the plan. ____________________________________________________________________________________________________________________  HPI: 65 years old male with a past medical history of psoriatic arthritis, serpositive rheumatoid arthritis, CAD/ischemic cardiomyopathy , HTN,  HLD, PAF who is referred for evaluation of a positive QuantiFERON test.  Patient was tested negative for QuantiFERON on 3/25 /2021, 01/21/2018 and 09/19/2016  He has a history of ILD and follows up with pulmonary Dr. Elsworth Soho.  PFTs March 2022 stable, FVC 1.93/57%, TLC 55%, DLCO 11.50/48%.  He will be following up with pulmonary every 6 months with chest x-ray once a year.  Patient has a history of chronic cough for over a year which he says is mainly nonproductive.  He mostly coughs in the morning and then 4-5 times in a day.  Denies any fevers chills onight sweats.  Denies any chest pain, shortness of breath present-he is able to walk 1-2 blocks without any difficulty.  Appetite is good.  Weight has  been stable.  He also has a history of CAD with ischemic cardiomyopathy with last 2D echo on 05/09/2018 with ejection fraction 40 to 45% range without valvular abnormalities.  He is following up with cardiology Dr. [name].  He follows up with rheumatology DR Estanislado Pandy for management of rheumatoid arthritis and psoriatic arthritis.  He states he has been on Orencia, Imuran and prednisone which has well controlled his arthritis.  Orencia and Imuran was on hold after positive QuantiFERON test.  He was born in [place], [place] and has not traveled out of the country except her trip to [place] 20 years ago.  Denies ever being homeless, incarcerated or being an IVDU.  Denies any known contact with tuberculosis.  Denies family members with tuberculosis.  Has not worked in healthcare setting. No recent changes in frequency or intensity of cough.   Ex smoker quit smoking 15- 20 years ago, used to smoke 10 cigarettes a day .  Alcohol is occasional.  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    ROS: Constitutional: Negative for fever, chills, activity change, appetite change, fatigue and unexpected weight change.  HENT: Negative for congestion, sore throat, rhinorrhea,  sneezing, trouble swallowing and sinus pressure.  Eyes: Negative for photophobia and visual disturbance.  Respiratory: Negative for chest tightness, shortness of breath, wheezing and stridor.  Cardiovascular: Negative for chest pain, palpitations and leg swelling.  Gastrointestinal: Negative for nausea, vomiting, abdominal pain, diarrhea, constipation, blood in stool, abdominal distention and anal bleeding.  Genitourinary: Negative for dysuria, hematuria, flank pain and difficulty urinating.  Musculoskeletal: Negative for myalgias, back pain, joint swelling, arthralgias and gait problem.  Skin: Negative for color change, pallor, rash and wound.  Neurological: Negative for dizziness, tremors, weakness and light-headedness.  Hematological: Negative for adenopathy. Does not bruise/bleed easily.  Psychiatric/Behavioral: Negative for behavioral problems, confusion, sleep disturbance, dysphoric mood, decreased concentration and agitation.   Past Medical History:  Diagnosis Date   Abnormal CT scan, stomach    Chronic systolic dysfunction of left ventricle    Coronary artery disease    lateral STEMI 02/22/15 3/10 cutting balloon to ISR of the o/pLCX   GERD (gastroesophageal reflux disease)    Hyperlipidemia    Hypertension    Ischemic cardiomyopathy    LBBB (left bundle branch block)    Leukocytosis    followed by hematology, reactive   Lymphadenopathy    Myocardial infarction ([place]) 02/2015   Psoriasis 2003   Psoriatic arthritis ([place])    Pulmonary fibrosis ([place])    Rheumatoid arthritis(714.0) 2012   Typical atrial flutter ([place])    Past Surgical History:  Procedure Laterality Date   CARDIAC CATHETERIZATION N/A 02/22/2015   Procedure: Left Heart Cath and Coronary Angiography;  Surgeon: Lorretta Harp, MD;  Location: [facility];  Service: Cardiovascular;  Laterality: N/A;   CARDIAC CATHETERIZATION N/A 02/22/2015   Procedure: Coronary Stent Intervention;  Surgeon: Lorretta Harp, MD;  Location: [facility];  Service: Cardiovascular;  Laterality: N/A;   CARDIOVERSION N/A 04/12/2015   Procedure: CARDIOVERSION;  Surgeon: Larey Dresser, MD;  Location: [facility];  Service: Cardiovascular;  Laterality: N/A;   COLONOSCOPY  07/01/2003   UDJ:SHFWYO colonic mucosa except for the proximal right colon in the area of ICV which was not seen completely due to inadequate bowel prep. followed with ACBE which was normal.    COLONOSCOPY N/A 08/24/2015   Dr. Gala Romney: Normal colon. Next  colonoscopy in 10 years.   CORONARY BALLOON ANGIOPLASTY N/A 05/12/2018   Procedure: CORONARY BALLOON ANGIOPLASTY;  Surgeon: Belva Crome, MD;  Location: [facility];  Service: Cardiovascular;  Laterality: N/A;   ELECTROPHYSIOLOGIC STUDY N/A 05/30/2015   Atrial fibrillation ablation by Dr Rayann Heman   ESOPHAGOGASTRODUODENOSCOPY N/A 08/24/2015   Dr. Gala Romney: Medium-sized hiatal hernia, erosive gastropathy. Cameron lesions. Esophageal mucosa distally suggestive of short segment Barrett's esophagus. Not confirmed on biopsy. Gastric biopsy with minimal chronic inflammation   GIVENS CAPSULE STUDY N/A 04/17/2016   Procedure: GIVENS CAPSULE STUDY;  Surgeon: Daneil Dolin, MD;  [facility];  Service: Endoscopy;  Laterality: N/A;  Pt to arrive at 8:00 am for 8:30 am appt   LEFT HEART CATH AND CORONARY ANGIOGRAPHY N/A 05/11/2018   Procedure: LEFT HEART CATH AND CORONARY ANGIOGRAPHY;  Surgeon: Belva Crome, MD;  Location: [facility];  Service: Cardiovascular;  Laterality: N/A;   TEE WITHOUT CARDIOVERSION N/A 04/12/2015   Procedure: TRANSESOPHAGEAL ECHOCARDIOGRAM (TEE);  Surgeon: Larey Dresser, MD;  Location[facility];  Service: Cardiovascular;  Laterality: N/A;   Current Outpatient Medications on File Prior to Visit  Medication Sig Dispense Refill   Abatacept (ORENCIA CLICKJECT) 154 MG/ML SOAJ Inject 125 mg into the skin once a week. 12 mL 0   amLODipine (NORVASC) 5 MG tablet TAKE 1  TABLET EVERY DAY (NEED MD APPOINTMENT) 30 tablet 0   atorvastatin (LIPITOR) 80 MG tablet TAKE 1 TABLET (80 MG TOTAL) BY MOUTH DAILY AT 6 PM. 90 tablet 0   azaTHIOprine (IMURAN) 50 MG tablet Take 1 tablet (50 mg total) by mouth 2 (two) times daily. 180 tablet 0   Clobetasol Prop Emollient Base 0.05 % emollient cream APPLY TO AFFECTED AREA TWICE A DAY AS NEEDED 30 g 2   clopidogrel (PLAVIX) 75 MG tablet TAKE 1 TABLET (75 MG TOTAL) BY MOUTH DAILY. 90 tablet 3   ELIQUIS 5 MG TABS tablet TAKE 1 TABLET BY MOUTH TWICE A DAY 180 tablet 1   furosemide (LASIX) 40 MG tablet TAKE 1 TABLET TWICE DAILY 180 tablet 3   losartan (COZAAR) 100 MG tablet TAKE 1 TABLET EVERY DAY. PLEASE SCHEDULE APPOINTMENT FOR FURTHER REFILLS. 90 tablet 0   pantoprazole (PROTONIX) 40 MG tablet TAKE 1 TABLET TWICE DAILY 180 tablet 1   potassium chloride SA (KLOR-CON) 20 MEQ tablet TAKE 2 TABLETS TWICE DAILY 360 tablet 3   predniSONE (DELTASONE) 5 MG tablet TAKE 1 TABLET EVERY DAY WITH BREAKFAST 90 tablet 0   sotalol (BETAPACE) 120 MG tablet TAKE 1 TABLET EVERY 12 HOURS 180 tablet 3   No current facility-administered medications on file prior to visit.   No Known Allergies  Social History   Socioeconomic History   Marital status: Married    Spouse name: Not on file   Number of children: 2   Years of education: Not on file   Highest education level: Not on file  Occupational History   Occupation: unemployed    Comment: not working do to arthritis; used to be Patent attorney for a Psychologist, clinical  Tobacco Use   Smoking status: Former    Packs/day: 1.00    Years: 30.00    Pack years: 30.00    Types: Cigarettes    Quit date: 03/21/2003    Years since quitting: 17.4   Smokeless tobacco: Never  Vaping Use   Vaping Use: Never used  Substance and Sexual Activity   Alcohol use: No  Alcohol/week: 0.0 standard drinks    Comment: H/O case of beer weekly x 20 years, quiting in 2000-ish.   Drug use: No    Comment: H/O  marijuana use many years ago.   Sexual activity: Yes    Birth control/protection: None  Other Topics Concern   Not on file  Social History Narrative   Not on file   Social Determinants of Health   Financial Resource Strain: Not on file  Food Insecurity: Not on file  Transportation Needs: Not on file  Physical Activity: Not on file  Stress: Not on file  Social Connections: Not on file  Intimate Partner Violence: Not on file     Vitals BP (!) 165/66 (BP Location: Right Arm, Patient Position: Sitting, Cuff Size: Normal)   Pulse (!) 44   Temp 97.8 F (36.6 C) (Oral)   Resp 18   Ht 5\' 7"  (1.702 m)   Wt 190 lb 9.6 oz (86.5 kg)   SpO2 100%   BMI 29.85 kg/m     Examination  General - not in acute distress, comfortably sitting in chair HEENT - PEERLA, no pallor and no icterus Chest - b/l clear air entry, FINE CRACKLES BILATERALLY CVS- Normal s1s2, RRR Abdomen - Soft, Non tender , non distended Ext- no pedal edema Neuro: grossly normal Back - WNL Psych : calm and cooperative   Recent labs CBC Latest Ref Rng & Units 08/02/2020 03/22/2020 03/14/2020  WBC 4.0 - 10.5 K/uL 13.2(H) 12.0(H) 12.9(H)  Hemoglobin 13.0 - 17.0 g/dL 15.4 15.0 15.1  Hematocrit 39.0 - 52.0 % 48.7 47.9 45.7  Platelets 150 - 400 K/uL 216 223 228   CMP Latest Ref Rng & Units 08/02/2020 07/19/2020 03/22/2020  Glucose 70 - 99 mg/dL 99 - 87  BUN 8 - 23 mg/dL 14 - 12  Creatinine 0.61 - 1.24 mg/dL 0.69 - 0.81  Sodium 135 - 145 mmol/L 140 - 140  Potassium 3.5 - 5.1 mmol/L 4.5 - 4.2  Chloride 98 - 111 mmol/L 102 - 103  CO2 22 - 32 mmol/L 32 - 30  Calcium 8.9 - 10.3 mg/dL 9.3 - 9.2  Total Protein 6.5 - 8.1 g/dL 6.8 7.5 6.7  Total Bilirubin 0.3 - 1.2 mg/dL 0.6 1.2 0.5  Alkaline Phos 38 - 126 U/L 57 64 53  AST 15 - 41 U/L 27 31 29   ALT 0 - 44 U/L 24 26 24      Pertinent Microbiology Results for orders placed or performed during the hospital encounter of 05/15/20  SARS CORONAVIRUS 2 (TAT 6-24 HRS)  Nasopharyngeal Nasopharyngeal Swab     Status: None   Collection Time: 05/15/20  9:05 AM   Specimen: Nasopharyngeal Swab  Result Value Ref Range Status   SARS Coronavirus 2 NEGATIVE NEGATIVE Final    Comment: (NOTE) SARS-CoV-2 target nucleic acids are NOT DETECTED.  The SARS-CoV-2 RNA is generally detectable in upper and lower respiratory specimens during the acute phase of infection. Negative results do not preclude SARS-CoV-2 infection, do not rule out co-infections with other pathogens, and should not be used as the sole basis for treatment or other patient management decisions. Negative results must be combined with clinical observations, patient history, and epidemiological information. The expected result is Negative.  Fact Sheet for Patients: SugarRoll.be  Fact Sheet for Healthcare Providers: https://www.woods-mathews.com/  This test is not yet approved or cleared by the [place] FDA and  has been authorized for detection and/or diagnosis of SARS-CoV-2 by FDA under an Emergency Use  Authorization (EUA). This EUA will remain  in effect (meaning this test can be used) for the duration of the COVID-19 declaration under Se ction 564(b)(1) of the Act, 21 U.S.C. section 360bbb-3(b)(1), unless the authorization is terminated or revoked sooner.  Performed at [facility], [place] [address], [place], [place] [address]       Pertinent Imaging Ct chest 12/21/19  FINDINGS: Cardiovascular: Heart size is enlarged with left ventricular and left atrial dilatation. There is no significant pericardial fluid, thickening or pericardial calcification. There is aortic atherosclerosis, as well as atherosclerosis of the great vessels of the mediastinum and the coronary arteries, including calcified atherosclerotic plaque in the left main, left anterior descending, left circumflex and right coronary arteries. Curvilinear hypoattenuation in  the left ventricular myocardium within the distal LAD territory indicative of fibrofatty metaplasia from prior LAD territory myocardial infarction.   Mediastinum/Nodes: No pathologically enlarged mediastinal or hilar lymph nodes. Please note that accurate exclusion of hilar adenopathy is limited on noncontrast CT scans. Moderate-sized hiatal hernia. No axillary lymphadenopathy.   Lungs/Pleura: When compared to the prior high-resolution chest CT 05/12/2015, there are again widespread patchy areas of ground-glass attenuation, septal thickening, bronchiolectasis and honeycombing. Many of the affected areas, particularly in the lung bases, appear to spare the median subpleural interstitium. Scattered areas of mild cylindrical bronchiectasis and some peripheral bronchiolectasis are noted. Overall, there is minimal progression of disease compared to the prior study from 2017. Inspiratory and expiratory imaging is unremarkable. No acute consolidative airspace disease. No pleural effusions. No definite suspicious appearing pulmonary nodules or masses are noted.   Upper Abdomen: Aortic atherosclerosis.   Musculoskeletal: There are no aggressive appearing lytic or blastic lesions noted in the visualized portions of the skeleton.   IMPRESSION: 1. The appearance of the lungs remains compatible with interstitial lung disease, technically characterized as probable usual interstitial pneumonia (UIP) per current ATS guidelines. 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 2. Aortic atherosclerosis, in addition to left main and 3 vessel coronary artery disease. Please note that although the presence of coronary artery calcium documents the presence of coronary artery disease, the severity of this disease and any potential  stenosis cannot be assessed on this non-gated CT examination. Assessment for potential risk factor modification, dietary therapy or pharmacologic therapy may be warranted, if clinically indicated. 3. Cardiomegaly with left ventricular and left atrial dilatation. 4. Moderate-sized hiatal hernia.   Aortic Atherosclerosis (ICD10-I70.0).     All pertent labs/Imagings/notes reviewed. All pertinent plain films and CT images have been personally visualized and interpreted; radiology reports have been reviewed. Decision making incorporated into the Impression / Recommendations.  I have spent 60 minutes for this patient encounter including  review of prior medical records with greater than 50% of time in face to face counsel of the patient/discussing diagnostics and plan of care.   Electronically signed by:  Rosiland Oz, MD Infectious Disease Physician [facility] [address] [place], [place] [address] Phone: [phone]  Fax: [phone]

## 2020-08-16 ENCOUNTER — Ambulatory Visit: Payer: Medicare HMO | Admitting: Infectious Disease

## 2020-08-16 LAB — HEPATITIS B SURFACE ANTIGEN: Hepatitis B Surface Ag: NONREACTIVE

## 2020-08-16 LAB — HEPATITIS B CORE ANTIBODY, TOTAL: Hep B Core Total Ab: NONREACTIVE

## 2020-08-16 LAB — HEPATITIS B CORE ANTIBODY, IGM: Hep B C IgM: NONREACTIVE

## 2020-08-16 LAB — HEPATITIS C ANTIBODY
Hepatitis C Ab: NONREACTIVE
SIGNAL TO CUT-OFF: 0.02 (ref <1.00)

## 2020-08-16 LAB — HIV ANTIBODY (ROUTINE TESTING W REFLEX): HIV 1&2 Ab, 4th Generation: NONREACTIVE

## 2020-08-16 LAB — HEPATITIS B SURFACE ANTIBODY,QUALITATIVE: Hep B S Ab: NONREACTIVE

## 2020-08-22 ENCOUNTER — Other Ambulatory Visit: Payer: Self-pay | Admitting: Cardiovascular Disease

## 2020-08-22 ENCOUNTER — Telehealth: Payer: Self-pay | Admitting: Cardiovascular Disease

## 2020-08-22 MED ORDER — ELIQUIS 5 MG PO TABS: 1.0000 | ORAL_TABLET | Freq: Two times a day (BID) | ORAL | 0 refills | Status: DC

## 2020-08-22 NOTE — Telephone Encounter (Signed)
27m, 86.5kg, scr 0.69 08/02/20, lovw/berry 07/07/20

## 2020-08-22 NOTE — Telephone Encounter (Signed)
48m, 86.5kg, scr 0.69 08/02/20, lovw/berry 07/07/20

## 2020-08-22 NOTE — Telephone Encounter (Signed)
Patient's wife is requesting to have a 7 day supply of Eliquis sent to the patient's local pharmacy, listed below. She states the patient will be out of medication on Saturday 08/26/20 and his mail order isn't expected to arrive for at least another week.   *STAT* If patient is at the pharmacy, call can be transferred to refill team.   1. Which medications need to be refilled? (please list name of each medication and dose if known)  ELIQUIS 5 MG TABS  2. Which pharmacy/location (including street and city if local pharmacy) is medication to be sent to? [id number] - [place], [place]  3. Do they need a 30 day or 90 day supply?  7 day supply

## 2020-08-24 ENCOUNTER — Telehealth: Payer: Self-pay

## 2020-08-24 NOTE — Telephone Encounter (Signed)
-----   Message from Rosiland Oz, MD sent at 08/24/2020  4:14 PM EDT ----- Regarding: Appt tomorrow  I need to have his CT chest done before his appointment tomorrow. If has not been done, please reschedule. Thanks.

## 2020-08-24 NOTE — Telephone Encounter (Signed)
CT scheduled for 09/12/20, patient rescheduled for office visit 09/15/20.   Beryle Flock, RN

## 2020-08-25 ENCOUNTER — Ambulatory Visit: Payer: Medicare HMO | Admitting: Infectious Diseases

## 2020-08-29 ENCOUNTER — Other Ambulatory Visit: Payer: Self-pay | Admitting: Cardiovascular Disease

## 2020-09-08 NOTE — Progress Notes (Signed)
Office Visit Note  Patient: Curtis Flores             Date of Birth: 11-07-1955           MRN: [id number]             PCP: Chesley Noon, MD Referring: Chesley Noon, MD Visit Date: 09/22/2020 Occupation: @GUAROCC @  Subjective:  Other (Patient discontinued orencia and imuran per recommendation of infectious disease approximately 4-5 weeks ago. )   History of Present Illness: Curtis Flores is a 65 y.o. male with a history of psoriatic arthritis and rheumatoid arthritis overlap.  He has been off Orencia and Imuran for approximately 5 weeks now.  He has been taking prednisone 5 mg p.o. daily.He is experiencing a stiffness and swelling in the morning.  He is waiting to start on treatment for latent tuberculosis.  Activities of Daily Living:  Patient reports morning stiffness for 5 minutes.   Patient Denies nocturnal pain.  Difficulty dressing/grooming: Denies Difficulty climbing stairs: Denies Difficulty getting out of chair: Denies Difficulty using hands for taps, buttons, cutlery, and/or writing: Reports  Review of Systems  Constitutional:  Negative for fatigue.  HENT:  Negative for mouth sores, mouth dryness and nose dryness.   Eyes:  Positive for dryness. Negative for pain and itching.  Respiratory:  Positive for shortness of breath. Negative for difficulty breathing.   Cardiovascular:  Negative for chest pain and palpitations.  Gastrointestinal:  Negative for blood in stool, constipation and diarrhea.  Endocrine: Negative for increased urination.  Genitourinary:  Negative for difficulty urinating.  Musculoskeletal:  Positive for joint pain, joint pain and morning stiffness. Negative for joint swelling, myalgias, muscle tenderness and myalgias.  Skin:  Negative for color change, rash and redness.  Allergic/Immunologic: Negative for susceptible to infections.  Neurological:  Negative for dizziness, numbness, headaches, memory loss and weakness.  Hematological:  Positive  for bruising/bleeding tendency.  Psychiatric/Behavioral:  Negative for confusion.    PMFS History:  Patient Active Problem List   Diagnosis Date Noted   Atrial fibrillation with RVR ([place]) 05/28/2018   Chronic anticoagulation 05/28/2018   NSTEMI (non-ST elevated myocardial infarction) ([place]) 05/09/2018   Ischemic cardiomyopathy 12/04/2016   Leukocytosis 07/17/2016   Pain in joint involving multiple sites 04/25/2016   Psoriatic arthritis ([place]) 02/14/2016   High risk medication use 02/14/2016   NICM (nonischemic cardiomyopathy) ([place]) 08/29/2015   Essential hypertension 08/29/2015   Abnormal CT scan, stomach    Hiatal hernia    GERD (gastroesophageal reflux disease) 08/01/2015   Weight loss, unintentional 08/01/2015   Iron deficiency anemia 06/06/2015   Heme positive stool 06/06/2015   PAF (paroxysmal atrial fibrillation) ([place]) 05/30/2015   CAD S/P percutaneous coronary angioplasty 04/07/2015   Old lateral wall myocardial infarction 04/07/2015   Acute on chronic systolic congestive heart failure ([place]) 04/07/2015   LBBB (left bundle branch block) 04/07/2015   Wide-complex tachycardia ([place]) 04/07/2015   Dyslipidemia, goal LDL below 70 03/07/2015   Psoriasis    Rheumatoid arthritis ([place])    Pulmonary fibrosis ([place]) 03/21/2011    Past Medical History:  Diagnosis Date   Abnormal CT scan, stomach    Chronic systolic dysfunction of left ventricle    Coronary artery disease    lateral STEMI 02/22/15 3/10 cutting balloon to ISR of the o/pLCX   GERD (gastroesophageal reflux disease)    Hyperlipidemia    Hypertension    Ischemic cardiomyopathy    LBBB (left bundle branch block)  Leukocytosis    followed by hematology, reactive   Lymphadenopathy    Myocardial infarction ([place]) 02/2015   Psoriasis 2003   Psoriatic arthritis ([place])    Pulmonary fibrosis ([place])    Rheumatoid arthritis(714.0) 2012   Typical atrial flutter ([facility])     Family History  Problem Relation Age of Onset    Hypertension Mother    Colon cancer Neg Hx    Past Surgical History:  Procedure Laterality Date   CARDIAC CATHETERIZATION N/A 02/22/2015   Procedure: Left Heart Cath and Coronary Angiography;  Surgeon: Lorretta Harp, MD;  Location: [facility];  Service: Cardiovascular;  Laterality: N/A;   CARDIAC CATHETERIZATION N/A 02/22/2015   Procedure: Coronary Stent Intervention;  Surgeon: Lorretta Harp, MD;  Location: [place] [facility];  Service: Cardiovascular;  Laterality: N/A;   CARDIOVERSION N/A 04/12/2015   Procedure: CARDIOVERSION;  Surgeon: Larey Dresser, MD;  Location: [facility] END[facility];  Service: Cardiovascular;  Laterality: N/A;   COLONOSCOPY  07/01/2003   QZR:AQTMAU colonic mucosa except for the proximal right colon in the area of ICV which was not seen completely due to inadequate bowel prep. followed with ACBE which was normal.    COLONOSCOPY N/A 08/24/2015   Dr. Gala Romney: Normal colon. Next colonoscopy in 10 years.   CORONARY BALLOON ANGIOPLASTY N/A 05/12/2018   Procedure: CORONARY BALLOON ANGIOPLASTY;  Surgeon: Belva Crome, MD;  Location: [place] [facility];  Service: Cardiovascular;  Laterality: N/A;   ELECTROPHYSIOLOGIC STUDY N/A 05/30/2015   Atrial fibrillation ablation by Dr Rayann Heman   ESOPHAGOGASTRODUODENOSCOPY N/A 08/24/2015   Dr. Gala Romney: Medium-sized hiatal hernia, erosive gastropathy. Cameron lesions. Esophageal mucosa distally suggestive of short segment Barrett's esophagus. Not confirmed on biopsy. Gastric biopsy with minimal chronic inflammation   GIVENS CAPSULE STUDY N/A 04/17/2016   Procedure: GIVENS CAPSULE STUDY;  Surgeon: Daneil Dolin, MD;  [facility];  Service: Endoscopy;  Laterality: N/A;  Pt to arrive at 8:00 am for 8:30 am appt   LEFT HEART CATH AND CORONARY ANGIOGRAPHY N/A 05/11/2018   Procedure: LEFT HEART CATH AND CORONARY ANGIOGRAPHY;  Surgeon: Belva Crome, MD;  Location: [facility];  Service: Cardiovascular;  Laterality: N/A;   TEE  WITHOUT CARDIOVERSION N/A 04/12/2015   Procedure: TRANSESOPHAGEAL ECHOCARDIOGRAM (TEE);  Surgeon: Larey Dresser, MD;  Location: [facility];  Service: Cardiovascular;  Laterality: N/A;   Social History   Social History Narrative   Not on file   Immunization History  Administered Date(s) Administered   Fluad Quad(high Dose 65+) 12/09/2018   Influenza Split 12/22/2010   Influenza Whole 01/18/2010   Influenza, High Dose Seasonal PF 11/11/2017, 12/02/2018   Influenza, Seasonal, Injecte, Preservative Fre 12/03/2014, 12/13/2015, 12/03/2017   Influenza,inj,Quad PF,6+ Mos 11/29/2019   Influenza-Unspecified 12/03/2014, 12/13/2015, 11/12/2016   Moderna Sars-Covid-2 Vaccination 05/08/2019, 06/09/2019, 01/29/2020, 08/09/2020   Pneumococcal Conjugate-13 08/20/2016, 04/07/2018   Pneumococcal Polysaccharide-23 01/22/2011, 03/05/2011   Pneumococcal-Unspecified 01/22/2011, 03/05/2011     Objective: Vital Signs: BP (!) 169/83 (BP Location: Left Arm, Patient Position: Sitting, Cuff Size: Normal)   Pulse (!) 42   Resp 15   Ht 5\' 6"  (1.676 m)   Wt 194 lb 6.4 oz (88.2 kg)   BMI 31.38 kg/m    Physical Exam Vitals and nursing note reviewed.  Constitutional:      Appearance: He is well-developed.  HENT:     Head: Normocephalic and atraumatic.  Eyes:     Conjunctiva/sclera: Conjunctivae normal.     Pupils:  Pupils are equal, round, and reactive to light.  Cardiovascular:     Rate and Rhythm: Normal rate and regular rhythm.     Heart sounds: Normal heart sounds.  Pulmonary:     Effort: Pulmonary effort is normal.     Breath sounds: Normal breath sounds.  Abdominal:     General: Bowel sounds are normal.     Palpations: Abdomen is soft.  Musculoskeletal:     Cervical back: Normal range of motion and neck supple.  Skin:    General: Skin is warm and dry.     Capillary Refill: Capillary refill takes less than 2 seconds.  Neurological:     Mental Status: He is alert and oriented to person,  place, and time.  Psychiatric:        Behavior: Behavior normal.     Musculoskeletal Exam: C-spine was in good range of motion.  Shoulder joints and elbow joints with good range of motion.  Wrist joints in good range of motion.  He has MCP thickening bilaterally with mild synovitis in the bilateral fifth MCP joints.  Hip joints and knee joints are in good range of motion.  He had no tenderness over ankles or MTPs.  CDAI Exam: CDAI Score: 3.4  Patient Global: 2 mm; Provider Global: 2 mm Swollen: 3 ; Tender: 0  Joint Exam 09/22/2020      Right  Left  MCP 2  Swollen      MCP 5  Swollen   Swollen      Investigation: No additional findings.  Imaging: CT CHEST W CONTRAST  Result Date: 09/14/2020 CLINICAL DATA:  Persistent cough, tuberculosis screening, interstitial lung disease EXAM: CT CHEST WITH CONTRAST TECHNIQUE: Multidetector CT imaging of the chest was performed during intravenous contrast administration. CONTRAST:  87mL OMNIPAQUE IOHEXOL 300 MG/ML  SOLN COMPARISON:  12/21/2019 FINDINGS: Cardiovascular: Aortic atherosclerosis. Cardiomegaly. Three-vessel coronary artery calcifications. No pericardial effusion. Mediastinum/Nodes: No enlarged mediastinal, hilar, or axillary lymph nodes. Small hiatal hernia. Thyroid gland, trachea, and esophagus demonstrate no significant findings. Lungs/Pleura: No significant interval change in moderate pulmonary fibrosis in a pattern with apical to basal gradient, featuring cystic change and bronchiolectasis with associated ground-glass and extensive subpleural sparing about the lung bases. No acute appearing airspace disease. No pleural effusion or pneumothorax. Upper Abdomen: No acute abnormality. Musculoskeletal: No chest wall mass or suspicious bone lesions identified. IMPRESSION: 1. No significant interval change in moderate pulmonary fibrosis in a pattern with apical to basal gradient, featuring cystic change and bronchiolectasis with associated  ground-glass and extensive subpleural sparing about the lung bases. Findings are in an "alternative diagnosis" pattern by ATS pulmonary fibrosis criteria and favor chronic fibrotic phase NSIP. Findings are suggestive of an alternative diagnosis (not UIP) per consensus guidelines: Diagnosis of Idiopathic Pulmonary Fibrosis: An Official ATS/ERS/JRS/ALAT Clinical Practice Guideline. [place], Iss 5, [phone], Nov 02 2016. 2. No acute appearing airspace disease or evidence of pulmonary tuberculosis. 3. Hiatal hernia. 4. Cardiomegaly and coronary artery disease. Aortic Atherosclerosis (ICD10-I70.0). Electronically Signed   By: Eddie Candle M.D.   On: 09/14/2020 08:16    Recent Labs: Lab Results  Component Value Date   WBC 13.2 (H) 08/02/2020   HGB 15.4 08/02/2020   PLT 216 08/02/2020   NA 140 08/02/2020   K 4.5 08/02/2020   CL 102 08/02/2020   CO2 32 08/02/2020   GLUCOSE 99 08/02/2020   BUN 14 08/02/2020   CREATININE 0.70 09/12/2020   BILITOT 0.6  08/02/2020   ALKPHOS 57 08/02/2020   AST 27 08/02/2020   ALT 24 08/02/2020   PROT 6.8 08/02/2020   ALBUMIN 3.7 08/02/2020   CALCIUM 9.3 08/02/2020   GFRAA 114 03/14/2020   QFTBGOLDPLUS Positive (A) 08/02/2020    Speciality Comments: No specialty comments available.  Procedures:  No procedures performed Allergies: Patient has no known allergies.   Assessment / Plan:     Visit Diagnoses: Rheumatoid arthritis involving multiple sites with positive rheumatoid factor ([place]) he has severe disease with an adequate response to multiple medications in the past.  He had been doing well on the combination of Orencia and Imuran which was discontinued due to positive TB Gold.  He was placed on prednisone 5 mg p.o. daily after taking of the medications.  Symptoms are manageable currently.  Psoriatic arthritis (HCC)-he has had dactylitis in the past.    Psoriasis - Scalp psoriasis-He has been using clobetasol cream 0.05% topically  twice daily as needed which has been managing his psoriasis.   High risk medication use - Orencia 125 mg sq weekly injections (10/11/2016), imuran 100 mg po qd, prednisone 5 mg po daily ( Inadequate response to Enbrel, Humira, Cosyntex, Otezla ).  Last labs were done on August 02, 2020 which were stable.  TB gold was positive on August 02, 2020.  Up-to-date on all the immunization was given and information was placed in the AVS.  Pulmonary fibrosis ([place]) -followed by Dr. Samuella Cota, NP  Positive QuantiFERON-TB Gold test-patient was evaluated by Dr. West Bali.  Patient will have to stay on prednisone 5 mg p.o. daily to prevent major flares.  My plan is to start him on Orencia after 1 month of treatment for latent tuberculosis.  We will Imuran could be added later.  He has a follow-up appointment with Dr. West Bali on October 26, 2020, I have sent a note to Dr. West Bali requesting an earlier appointment so we can avoid severe flare of rheumatoid arthritis.  Osteopenia of multiple sites - DEXA on 12/19/17 showed T score of -1.6 at left femur neck  -5% change in BMD. DEXA 04/28/20 T-score: -0.8, BMD: 0.827 left femoral neck.  Other medical problems are listed as follows:  History of bronchiectasis  History of congestive heart failure-increased risk of heart disease with rheumatoid arthritis was discussed.  Dietary modifications and exercise was emphasized and the information was placed in the AVS.  History of gastroesophageal reflux (GERD)  History of hyperlipidemia  History of coronary artery disease  History of atrial fibrillation  History of hypertension  Orders: No orders of the defined types were placed in this encounter.  No orders of the defined types were placed in this encounter.    Follow-Up Instructions: Return in about 3 months (around 12/23/2020) for Rheumatoid arthritis.   Bo Merino, MD  Note - This record has been created using Editor, commissioning.  Chart  creation errors have been sought, but may not always  have been located. Such creation errors do not reflect on  the standard of medical care.

## 2020-09-12 ENCOUNTER — Ambulatory Visit (HOSPITAL_COMMUNITY)
Admission: RE | Admit: 2020-09-12 | Discharge: 2020-09-12 | Disposition: A | Discharge status: Home / Self Care | Payer: Medicare HMO | Source: Ambulatory Visit | Attending: Infectious Diseases | Admitting: Infectious Diseases

## 2020-09-12 ENCOUNTER — Other Ambulatory Visit: Payer: Self-pay

## 2020-09-12 DIAGNOSIS — R059 Cough, unspecified: Secondary | ICD-10-CM | POA: Diagnosis present

## 2020-09-12 DIAGNOSIS — J849 Interstitial pulmonary disease, unspecified: Secondary | ICD-10-CM | POA: Diagnosis present

## 2020-09-12 LAB — POCT I-STAT CREATININE: Creatinine, Ser: 0.7 mg/dL (ref 0.61–1.24)

## 2020-09-12 MED ORDER — IOHEXOL 350 MG/ML SOLN: 75.0000 mL | Freq: Once | INTRAVENOUS | Status: DC | PRN

## 2020-09-12 MED ORDER — IOHEXOL 300 MG/ML  SOLN
75.0000 mL | Freq: Once | INTRAMUSCULAR | Status: AC | PRN
  Administered 2020-09-12: 75 mL via INTRAVENOUS

## 2020-09-15 ENCOUNTER — Ambulatory Visit (INDEPENDENT_AMBULATORY_CARE_PROVIDER_SITE_OTHER): Payer: Medicare HMO | Admitting: Infectious Diseases

## 2020-09-15 ENCOUNTER — Encounter: Payer: Self-pay | Admitting: Infectious Diseases

## 2020-09-15 ENCOUNTER — Other Ambulatory Visit: Payer: Self-pay

## 2020-09-15 VITALS — BP 157/68 | HR 46 | Temp 98.0°F | Wt 192.8 lb

## 2020-09-15 DIAGNOSIS — J849 Interstitial pulmonary disease, unspecified: Secondary | ICD-10-CM | POA: Diagnosis not present

## 2020-09-15 DIAGNOSIS — R7612 Nonspecific reaction to cell mediated immunity measurement of gamma interferon antigen response without active tuberculosis: Secondary | ICD-10-CM

## 2020-09-15 NOTE — Patient Instructions (Signed)
Please follow up with Dr Estanislado Pandy and check prednisone can be discontinued for few months for latent TB treatment to avoid drug interactions Notify me what decision is made Follow up with me in 4 weeks

## 2020-09-15 NOTE — Progress Notes (Signed)
[facility]                                                             [place], [place], [place], [address]                                                                  Phn. [phone]; Fax: [phone]                                                                             Date: 09/15/20  Reason for Follow up: latent TB   Assessment Problem List Items Addressed This Visit   None Visit Diagnoses     Positive QuantiFERON-TB Gold test    -  Primary   Interstitial pulmonary disease ([place])          Positive quantiferon test- 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 Orencia and Imuran on hold for above reasons. Onlyo n prednisone now  Pulmonary Fibrosis/ILD Follows with Pulmonary   CAD and Ischemic Cadiomyopathy/CHF PAF  Follows with Cardiology   Plan Patient has an appointment with Dr Estanislado Pandy next Friday and will check if his prednisone can be stopped for 12 weeks and will plan to treat wih Isoniazid and rifapentine *12 weeks at that time. I will also staff message Dr Estanislado Pandy I will make a follow up in 3-4 weeks. Patient will call office after visit with Dr Estanislado Pandy regarding continuation of prednisone.   All questions and concerns were discussed and addressed. Patient verbalized understanding of the plan. ____________________________________________________________________________________________________________________  HPI: 65 years old male with a past medical history of psoriatic arthritis, serpositive rheumatoid arthritis, CAD/ischemic cardiomyopathy , HTN, HLD, PAF who is referred for evaluation of a positive QuantiFERON test.  Patient was tested negative for QuantiFERON on 3/25 /2021, 01/21/2018 and 09/19/2016  He  has a history of ILD and follows up with pulmonary Dr. Elsworth Soho.  PFTs March 2022 stable, FVC 1.93/57%, TLC 55%, DLCO 11.50/48%.  He will be following up with pulmonary every 6 months with chest x-ray once a year.  Patient has a history of chronic cough for over a year which he says is mainly nonproductive.  He mostly coughs in the morning and then 4-5 times in a day.  Denies any fevers chills onight sweats.  Denies any chest pain, shortness of breath present-he is able to walk 1-2 blocks without any difficulty.  Appetite is good.  Weight has been stable.  He also has a history of CAD with ischemic cardiomyopathy with last 2D echo on 05/09/2018 with ejection fraction 40 to 45% range without valvular abnormalities.  He is following  up with cardiology Dr. [name].  He follows up with rheumatology DR Estanislado Pandy for management of rheumatoid arthritis and psoriatic arthritis.  He states he has been on Orencia, Imuran and prednisone which has well controlled his arthritis.  Orencia and Imuran was on hold after positive QuantiFERON test.  He was born in [place], [place] and has not traveled out of the country except her trip to [place] 20 years ago.  Denies ever being homeless, incarcerated or being an IVDU.  Denies any known contact with tuberculosis.  Denies family members with tuberculosis.  Has not worked in healthcare setting. No recent changes in frequency or intensity of cough.   Ex smoker quit smoking 15- 20 years ago, used to smoke 10 cigarettes a day .  Alcohol is occasional.  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   09/15/20 Here for follow up after CT chest done with no findings suggestive of TB. He tells me he is taking Prednisone 5 mg every day for pain. Discussed treatment options of latnet TB at length and answered questions/concerns. I discussed the drug interactions associated with Prednisone and Isoniazid and asked him to check with Dr Estanislado Pandy  if he can come off of Prednisone for few months by which time latent TB treatment with Isoniazid and Rifapentine for 12 weeks can be completed. Patient tells me that he takes prednisone for pain and he does not have pain anymore.I will also get in touch with Dr Estanislado Pandy regarding the plan.  ROS: 12 point ROS done with pertinent positives and negatives listed above   Past Medical History:  Diagnosis Date   Abnormal CT scan, stomach    Chronic systolic dysfunction of left ventricle    Coronary artery disease    lateral STEMI 02/22/15 3/10 cutting balloon to ISR of the o/pLCX   GERD (gastroesophageal reflux disease)    Hyperlipidemia    Hypertension    Ischemic cardiomyopathy    LBBB (left bundle branch block)    Leukocytosis    followed by hematology, reactive   Lymphadenopathy    Myocardial infarction ([place]) 02/2015   Psoriasis 2003   Psoriatic arthritis ([place])    Pulmonary fibrosis ([place])    Rheumatoid arthritis(714.0) 2012   Typical atrial flutter ([place])    Past Surgical History:  Procedure Laterality Date   CARDIAC CATHETERIZATION N/A 02/22/2015   Procedure: Left Heart Cath and Coronary Angiography;  Surgeon: Lorretta Harp, MD;  Location: [facility];  Service: Cardiovascular;  Laterality: N/A;   CARDIAC CATHETERIZATION N/A 02/22/2015   Procedure: Coronary Stent Intervention;  Surgeon: Lorretta Harp, MD;  Location: [facility];  Service: Cardiovascular;  Laterality: N/A;   CARDIOVERSION N/A 04/12/2015   Procedure: CARDIOVERSION;  Surgeon: Larey Dresser, MD;  Location: [facility];  Service: Cardiovascular;  Laterality: N/A;   COLONOSCOPY  07/01/2003   XTK:WIOXBD colonic mucosa except for the proximal right colon in the area of ICV which was not seen completely due to inadequate bowel prep. followed with ACBE which was normal.    COLONOSCOPY N/A 08/24/2015   Dr. Gala Romney: Normal colon. Next colonoscopy in 10 years.   CORONARY BALLOON ANGIOPLASTY N/A 05/12/2018    Procedure: CORONARY BALLOON ANGIOPLASTY;  Surgeon: Belva Crome, MD;  Location: [facility];  Service: Cardiovascular;  Laterality: N/A;   ELECTROPHYSIOLOGIC STUDY N/A 05/30/2015   Atrial fibrillation ablation by Dr Rayann Heman   ESOPHAGOGASTRODUODENOSCOPY N/A 08/24/2015  Dr. Gala Romney: Medium-sized hiatal hernia, erosive gastropathy. Cameron lesions. Esophageal mucosa distally suggestive of short segment Barrett's esophagus. Not confirmed on biopsy. Gastric biopsy with minimal chronic inflammation   GIVENS CAPSULE STUDY N/A 04/17/2016   Procedure: GIVENS CAPSULE STUDY;  Surgeon: Daneil Dolin, MD;  [facility];  Service: Endoscopy;  Laterality: N/A;  Pt to arrive at 8:00 am for 8:30 am appt   LEFT HEART CATH AND CORONARY ANGIOGRAPHY N/A 05/11/2018   Procedure: LEFT HEART CATH AND CORONARY ANGIOGRAPHY;  Surgeon: Belva Crome, MD;  Location: [facility];  Service: Cardiovascular;  Laterality: N/A;   TEE WITHOUT CARDIOVERSION N/A 04/12/2015   Procedure: TRANSESOPHAGEAL ECHOCARDIOGRAM (TEE);  Surgeon: Larey Dresser, MD;  Location: [facility] ENDOSCOPY;  Service: Cardiovascular;  Laterality: N/A;   Current Outpatient Medications on File Prior to Visit  Medication Sig Dispense Refill   amLODipine (NORVASC) 5 MG tablet TAKE 1 TABLET EVERY DAY (NEED MD APPOINTMENT) 30 tablet 0   apixaban (ELIQUIS) 5 MG TABS tablet TAKE 1 TABLET TWICE DAILY 180 tablet 1   atorvastatin (LIPITOR) 80 MG tablet TAKE 1 TABLET EVERY DAY AT 6 PM 90 tablet 3   clopidogrel (PLAVIX) 75 MG tablet TAKE 1 TABLET (75 MG TOTAL) BY MOUTH DAILY. 90 tablet 3   furosemide (LASIX) 40 MG tablet TAKE 1 TABLET TWICE DAILY 180 tablet 3   losartan (COZAAR) 100 MG tablet TAKE 1 TABLET EVERY DAY. PLEASE SCHEDULE APPOINTMENT FOR FURTHER REFILLS. 90 tablet 0   pantoprazole (PROTONIX) 40 MG tablet TAKE 1 TABLET TWICE DAILY 180 tablet 1   potassium chloride SA (KLOR-CON) 20 MEQ tablet TAKE 2 TABLETS TWICE DAILY 360 tablet 3   predniSONE  (DELTASONE) 5 MG tablet TAKE 1 TABLET EVERY DAY WITH BREAKFAST 90 tablet 0   sotalol (BETAPACE) 120 MG tablet TAKE 1 TABLET EVERY 12 HOURS 180 tablet 3   Clobetasol Prop Emollient Base 0.05 % emollient cream APPLY TO AFFECTED AREA TWICE A DAY AS NEEDED 30 g 2   No current facility-administered medications on file prior to visit.    No Known Allergies  Social History   Socioeconomic History   Marital status: Married    Spouse name: Not on file   Number of children: 2   Years of education: Not on file   Highest education level: Not on file  Occupational History   Occupation: unemployed    Comment: not working do to arthritis; used to be Patent attorney for a Psychologist, clinical  Tobacco Use   Smoking status: Former    Packs/day: 1.00    Years: 30.00    Pack years: 30.00    Types: Cigarettes    Quit date: 03/21/2003    Years since quitting: 17.5   Smokeless tobacco: Never  Vaping Use   Vaping Use: Never used  Substance and Sexual Activity   Alcohol use: No    Alcohol/week: 0.0 standard drinks    Comment: H/O case of beer weekly x 20 years, quiting in 2000-ish.   Drug use: No    Comment: H/O marijuana use many years ago.   Sexual activity: Yes    Birth control/protection: None  Other Topics Concern   Not on file  Social History Narrative   Not on file   Social Determinants of Health   Financial Resource Strain: Not on file  Food Insecurity: Not on file  Transportation Needs: Not on file  Physical Activity: Not on file  Stress: Not on file  Social Connections: Not on file  Intimate Partner Violence: Not on file     Vitals BP (!) 157/68   Pulse (!) 46   Temp 98 F (36.7 C) (Oral)   Wt 192 lb 12.8 oz (87.5 kg)   SpO2 97%   BMI 30.20 kg/m   Examination  General - not in acute distress, comfortably sitting in chair HEENT - PEERLA, no pallor and no icterus Chest - b/l clear air entry, fine rales bilaterally CVS- Normal s1s2, RRR Abdomen - Soft, Non tender ,  non distended Ext- no pedal edema Neuro: grossly normal Back - WNL Psych : calm and cooperative   Recent labs CBC Latest Ref Rng & Units 08/02/2020 03/22/2020 03/14/2020  WBC 4.0 - 10.5 K/uL 13.2(H) 12.0(H) 12.9(H)  Hemoglobin 13.0 - 17.0 g/dL 15.4 15.0 15.1  Hematocrit 39.0 - 52.0 % 48.7 47.9 45.7  Platelets 150 - 400 K/uL 216 223 228   CMP Latest Ref Rng & Units 09/12/2020 08/02/2020 07/19/2020  Glucose 70 - 99 mg/dL - 99 -  BUN 8 - 23 mg/dL - 14 -  Creatinine 0.61 - 1.24 mg/dL 0.70 0.69 -  Sodium 135 - 145 mmol/L - 140 -  Potassium 3.5 - 5.1 mmol/L - 4.5 -  Chloride 98 - 111 mmol/L - 102 -  CO2 22 - 32 mmol/L - 32 -  Calcium 8.9 - 10.3 mg/dL - 9.3 -  Total Protein 6.5 - 8.1 g/dL - 6.8 7.5  Total Bilirubin 0.3 - 1.2 mg/dL - 0.6 1.2  Alkaline Phos 38 - 126 U/L - 57 64  AST 15 - 41 U/L - 27 31  ALT 0 - 44 U/L - 24 26     Pertinent Microbiology Results for orders placed or performed during the hospital encounter of 05/15/20  SARS CORONAVIRUS 2 (TAT 6-24 HRS) Nasopharyngeal Nasopharyngeal Swab     Status: None   Collection Time: 05/15/20  9:05 AM   Specimen: Nasopharyngeal Swab  Result Value Ref Range Status   SARS Coronavirus 2 NEGATIVE NEGATIVE Final    Comment: (NOTE) SARS-CoV-2 target nucleic acids are NOT DETECTED.  The SARS-CoV-2 RNA is generally detectable in upper and lower respiratory specimens during the acute phase of infection. Negative results do not preclude SARS-CoV-2 infection, do not rule out co-infections with other pathogens, and should not be used as the sole basis for treatment or other patient management decisions. Negative results must be combined with clinical observations, patient history, and epidemiological information. The expected result is Negative.  Fact Sheet for Patients: SugarRoll.be  Fact Sheet for Healthcare Providers: https://www.woods-mathews.com/  This test is not yet approved or cleared by  the [place] FDA and  has been authorized for detection and/or diagnosis of SARS-CoV-2 by FDA under an Emergency Use Authorization (EUA). This EUA will remain  in effect (meaning this test can be used) for the duration of the COVID-19 declaration under Se ction 564(b)(1) of the Act, 21 U.S.C. section 360bbb-3(b)(1), unless the authorization is terminated or revoked sooner.  Performed at [facility], [name] [address] [place], [place] [address]       Pertinent Imaging Ct chest 12/21/19  FINDINGS: Cardiovascular: Heart size is enlarged with left ventricular and left atrial dilatation. There is no significant pericardial fluid, thickening or pericardial calcification. There is aortic atherosclerosis, as well as atherosclerosis of the great vessels of the mediastinum and the coronary arteries, including calcified atherosclerotic plaque in the left main, left anterior descending, left circumflex and right  coronary arteries. Curvilinear hypoattenuation in the left ventricular myocardium within the distal LAD territory indicative of fibrofatty metaplasia from prior LAD territory myocardial infarction.   Mediastinum/Nodes: No pathologically enlarged mediastinal or hilar lymph nodes. Please note that accurate exclusion of hilar adenopathy is limited on noncontrast CT scans. Moderate-sized hiatal hernia. No axillary lymphadenopathy.   Lungs/Pleura: When compared to the prior high-resolution chest CT 05/12/2015, there are again widespread patchy areas of ground-glass attenuation, septal thickening, bronchiolectasis and honeycombing. Many of the affected areas, particularly in the lung bases, appear to spare the median subpleural interstitium. Scattered areas of mild cylindrical bronchiectasis and some peripheral bronchiolectasis are noted. Overall, there is minimal progression of disease compared to the prior study from 2017. Inspiratory and expiratory imaging  is unremarkable. No acute consolidative airspace disease. No pleural effusions. No definite suspicious appearing pulmonary nodules or masses are noted.   Upper Abdomen: Aortic atherosclerosis.   Musculoskeletal: There are no aggressive appearing lytic or blastic lesions noted in the visualized portions of the skeleton.   IMPRESSION: 1. The appearance of the lungs remains compatible with interstitial lung disease, technically characterized as probable usual interstitial pneumonia (UIP) per current ATS guidelines. 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 2. Aortic atherosclerosis, in addition to left main and 3 vessel coronary artery disease. Please note that although the presence of coronary artery calcium documents the presence of coronary artery disease, the severity of this disease and any potential stenosis cannot be assessed on this non-gated CT examination. Assessment for potential risk factor modification, dietary therapy or pharmacologic therapy may be warranted, if clinically indicated. 3. Cardiomegaly with left ventricular and left atrial dilatation. 4. Moderate-sized hiatal hernia.   Aortic Atherosclerosis (ICD10-I70.0).   All pertent labs/Imagings/notes reviewed. All pertinent plain films and CT images have been personally visualized and interpreted; radiology reports have been reviewed. Decision making incorporated into the Impression / Recommendations.  I have spent 60 minutes for this patient encounter including  review of prior medical records with greater than 50% of time in face to face counsel of the patient/discussing diagnostics and plan of care.   Electronically signed by:  Rosiland Oz, MD Infectious Disease Physician [facility] [address] [place], [place] [address] Phone: [phone]  Fax: [phone]

## 2020-09-18 ENCOUNTER — Telehealth: Payer: Self-pay

## 2020-09-18 MED ORDER — AMLODIPINE BESYLATE 5 MG PO TABS: 5.0000 mg | ORAL_TABLET | Freq: Every day | ORAL | 6 refills | Status: DC

## 2020-09-18 NOTE — Telephone Encounter (Signed)
Spoke with patients spouse who was calling to get a refill on Norvasc. She states she thinks the patient is supposed to be taking this medication.   Reviewed the chart and explained to patients spouse that according to Dr Kennon Holter last note the medication has not been discontinued. She started reading me the expiration date on the rx and I explained to her that is just the number of refills before the doctor has to send in a new rx.   I advised her that I would send a new rx over to the pharmacy.   Rx(s) sent to pharmacy electronically.  Spouse voiced understanding.

## 2020-09-21 ENCOUNTER — Ambulatory Visit: Payer: Medicare HMO | Admitting: Rheumatology

## 2020-09-22 ENCOUNTER — Encounter: Payer: Self-pay | Admitting: Rheumatology

## 2020-09-22 ENCOUNTER — Ambulatory Visit (INDEPENDENT_AMBULATORY_CARE_PROVIDER_SITE_OTHER): Payer: Medicare HMO | Admitting: Rheumatology

## 2020-09-22 ENCOUNTER — Other Ambulatory Visit: Payer: Self-pay

## 2020-09-22 VITALS — BP 169/83 | HR 42 | Resp 15 | Ht 66.0 in | Wt 194.4 lb

## 2020-09-22 DIAGNOSIS — L4050 Arthropathic psoriasis, unspecified: Secondary | ICD-10-CM

## 2020-09-22 DIAGNOSIS — J8410 Pulmonary fibrosis, unspecified: Secondary | ICD-10-CM

## 2020-09-22 DIAGNOSIS — L409 Psoriasis, unspecified: Secondary | ICD-10-CM | POA: Diagnosis not present

## 2020-09-22 DIAGNOSIS — M0579 Rheumatoid arthritis with rheumatoid factor of multiple sites without organ or systems involvement: Secondary | ICD-10-CM

## 2020-09-22 DIAGNOSIS — Z79899 Other long term (current) drug therapy: Secondary | ICD-10-CM | POA: Diagnosis not present

## 2020-09-22 DIAGNOSIS — Z8719 Personal history of other diseases of the digestive system: Secondary | ICD-10-CM

## 2020-09-22 DIAGNOSIS — R7612 Nonspecific reaction to cell mediated immunity measurement of gamma interferon antigen response without active tuberculosis: Secondary | ICD-10-CM

## 2020-09-22 DIAGNOSIS — M8589 Other specified disorders of bone density and structure, multiple sites: Secondary | ICD-10-CM

## 2020-09-22 DIAGNOSIS — Z8679 Personal history of other diseases of the circulatory system: Secondary | ICD-10-CM

## 2020-09-22 DIAGNOSIS — Z8639 Personal history of other endocrine, nutritional and metabolic disease: Secondary | ICD-10-CM

## 2020-09-22 DIAGNOSIS — Z8709 Personal history of other diseases of the respiratory system: Secondary | ICD-10-CM

## 2020-09-22 NOTE — Patient Instructions (Signed)
Standing Labs We placed an order today for your standing lab work.   Please have your standing labs drawn in September and every 3 months  If possible, please have your labs drawn 2 weeks prior to your appointment so that the provider can discuss your results at your appointment.  Please note that you may see your imaging and lab results in [place] before we have reviewed them. We may be awaiting multiple results to interpret others before contacting you. Please allow our office up to 72 hours to thoroughly review all of the results before contacting the office for clarification of your results.  We have open lab daily: Monday through Thursday from 1:30-4:30 PM and Friday from 1:30-4:00 PM at the office of Dr. Bo Merino, [facility].   Please be advised, all patients with office appointments requiring lab work will take precedent over walk-in lab work.  If possible, please come for your lab work on Monday and Friday afternoons, as you may experience shorter wait times. The office is located at [address], [place], [place], [place] [address] No appointment is necessary.   Labs are drawn by [facility]. Please bring your co-pay at the time of your lab draw.  You may receive a bill from [facility] for your lab work.  If you wish to have your labs drawn at another location, please call the office 24 hours in advance to send orders.  If you have any questions regarding directions or hours of operation,  please call [phone].   As a reminder, please drink plenty of water prior to coming for your lab work. Thanks!   Vaccines You are taking a medication(s) that can suppress your immune system.  The following immunizations are recommended: Flu annually Covid-19  Td/Tdap (tetanus, diphtheria, pertussis) every 10 years Pneumonia (Prevnar 15 then Pneumovax 23 at least 1 year apart.  Alternatively, can take Prevnar 20 without needing additional dose) Shingrix (after age 19): 2  doses from 4 weeks to 6 months apart  Please check with your PCP to make sure you are up to date.   If you test POSITIVE for COVID19 and have MILD to MODERATE symptoms: First, call your PCP if you would like to receive COVID19 treatment AND Hold your medications during the infection and for at least 1 week after your symptoms have resolved: Injectable medication (Benlysta, Cimzia, Cosentyx, Enbrel, Humira, Orencia, Remicade, Simponi, Stelara, Taltz, Tremfya) Methotrexate Leflunomide (Arava) Azathioprine Mycophenolate (Cellcept) Morrie Sheldon, Olumiant, or Rinvoq If you take Actemra or Kevzara, you DO NOT need to hold these for COVID19 infection.  If you test POSITIVE for COVID19 and have NO symptoms: First, call your PCP if you would like to receive COVID19 treatment AND Hold your medications for at least 10 days after the day that you tested positive Injectable medication (Benlysta, Cimzia, Cosentyx, Enbrel, Humira, Orencia, Remicade, Simponi, Stelara, Taltz, Tremfya) Methotrexate Leflunomide (Arava) Azathioprine Mycophenolate (Cellcept) Morrie Sheldon, Olumiant, or Rinvoq If you take Actemra or Kevzara, you DO NOT need to hold these for COVID19 infection.  If you have signs or symptoms of an infection or start antibiotics: First, call your PCP for workup of your infection. Hold your medication through the infection, until you complete your antibiotics, and until symptoms resolve if you take the following: Injectable medication (Actemra, Benlysta, Cimzia, Cosentyx, Enbrel, Humira, Kevzara, Orencia, Remicade, Simponi, Stelara, Taltz, Tremfya) Methotrexate Leflunomide (Arava) Mycophenolate (Cellcept) Roma Kayser, or Rinvoq  Heart Disease Prevention   Your inflammatory disease increases your risk of heart disease  which includes heart attack, stroke, atrial fibrillation (irregular heartbeats), high blood pressure, heart failure and atherosclerosis (plaque in the arteries).  It is important  to reduce your risk by:   Keep blood pressure, cholesterol, and blood sugar at healthy levels   Smoking Cessation   Maintain a healthy weight  BMI 20-25   Eat a healthy diet  Plenty of fresh fruit, vegetables, and whole grains  Limit saturated fats, foods high in sodium, and added sugars  DASH and Mediterranean diet   Increase physical activity  Recommend moderate physically activity for 150 minutes per week/ 30 minutes a day for five days a week These can be broken up into three separate ten-minute sessions during the day.   Reduce Stress  Meditation, slow breathing exercises, yoga, coloring books  Dental visits twice a year

## 2020-10-02 ENCOUNTER — Telehealth: Payer: Self-pay

## 2020-10-02 NOTE — Telephone Encounter (Signed)
-----   Message from Rosiland Oz, MD sent at 10/02/2020  9:14 AM EDT ----- Regarding: RE:  I am ok seeing her with another provider to get her started with latent TB treatment asap per Rheum recs.  FYI, I also had messaged Cassie earlier if this patient can be seen earlier than 8/15.  ----- Message ----- From: Mignon Pine, DO Sent: 10/02/2020   8:55 AM EDT To: Bing Quarry, Dorie Lorita Officer, RN, #  Sorry I was out last week and so was Dr West Bali.  I'm adding her to the message thread so she can respond as needed.  I believe she is back now this week.  Thanks, Mitzi Hansen ----- Message ----- From: Carlean Purl, RN Sent: 09/27/2020   8:48 AM EDT To: Bing Quarry, Mignon Pine, DO  If possible for a 30 minute slot, yes.  ----- Message ----- From: Bing Quarry Sent: 09/25/2020   9:46 AM EDT To: Mignon Pine, DO, Dorie Lorita Officer, RN  Dr West Bali does not have any appointments until 10-16-20  can he be seen by someone else  She is not back in the clinic until then   Thanks   Joycelyn Schmid  ----- Message ----- From: Carlean Purl, RN Sent: 09/22/2020  12:52 PM EDT To: Bing Quarry  See Dr. Juleen China note below  ----- Message ----- From: Mignon Pine, DO Sent: 09/22/2020  12:14 PM EDT To: Bo Merino, MD, Rosiland Oz, MD, #  Hi Dr Estanislado Pandy,  I am covering for Dr West Bali this week while she is on vacation.  I saw your message and Dr Jerilynn Mages will be back on Monday, but I will also route to our triage pool to see if this patient can be scheduled sooner to start LTBI treatment.   Thanks, Mitzi Hansen   ----- Message ----- From: Bo Merino, MD Sent: 09/22/2020  11:03 AM EDT To: Rosiland Oz, MD  Dr. West Bali,  I saw Kevion in the office today.  He has history of severe rheumatoid arthritis and pulmonary fibrosis.  He has been off Orencia and Imuran to start on the treatment for latent tuberculosis.  He would not be able to stop  prednisone 5 mg p.o. daily.  My plan is to start him on Orencia after he has been on treatment for latent tuberculosis for 1 month.  Imuran can be added later.  I see that he has a follow-up appointment with you 1 month from now.  I am afraid that he will have his severe flare of his arthritis waiting to start on Biologics.  In that case we will have to increase his prednisone dose further.  If possible please see him as soon as possible to start him on the treatment for latent tuberculosis so we can restart him on Biologics soon.  Thank you,  Bo Merino

## 2020-10-02 NOTE — Telephone Encounter (Signed)
Attempted to schedule appointment for patient to be seen tomorrow with Dr. Linus Salmons at 3:30. Patient's wife answered call and states that they are not able to schedule any a appointments other then Thursday and Friday due to transportation. Patient relies on his Daughter in law to bring him to his appointments.  Patient's wife will call back later this week to see when they are able to schedule a follow up.  Leatrice Jewels, RMA

## 2020-10-02 NOTE — Telephone Encounter (Signed)
Patient's spouse call office back to schedule earlier appointment. Was able to schedule for 8/12. Not able to do earlier appointments due to transportation.  Leatrice Jewels, RMA

## 2020-10-13 ENCOUNTER — Ambulatory Visit (INDEPENDENT_AMBULATORY_CARE_PROVIDER_SITE_OTHER): Payer: Medicare HMO | Admitting: Internal Medicine

## 2020-10-13 ENCOUNTER — Ambulatory Visit: Payer: Medicare HMO | Admitting: Internal Medicine

## 2020-10-13 ENCOUNTER — Other Ambulatory Visit: Payer: Self-pay

## 2020-10-13 VITALS — BP 138/71 | HR 48 | Resp 16 | Ht 66.0 in | Wt 194.0 lb

## 2020-10-13 DIAGNOSIS — Z227 Latent tuberculosis: Secondary | ICD-10-CM | POA: Diagnosis not present

## 2020-10-13 MED ORDER — VITAMIN B-6 25 MG PO TABS: 25.0000 mg | ORAL_TABLET | Freq: Every day | ORAL | 5 refills | Status: AC

## 2020-10-13 MED ORDER — ISONIAZID 300 MG PO TABS: 300.0000 mg | ORAL_TABLET | Freq: Every day | ORAL | 5 refills | Status: AC

## 2020-10-13 NOTE — Patient Instructions (Addendum)
Follow up 4-6 weeks with Dr West Bali   We discussed many different regimen to treat latent tb  Both the 3 months and the 4 months have a medication that interfere with your eliquis   The option chosen today is isoniazid for 6 months. This will have no such interaction and works just as well or better. You'll also be taking a vitamin b6 with the isoniazid to protect your nerve   After 4-6 weeks of isoniazid, you can resume your rheumatoid arthritis medication. For now, I wouldn't recommend for than 5-15 mg prednisone at time (stop orencia/immuran for the next 4-6 weeks).

## 2020-10-13 NOTE — Progress Notes (Signed)
[facility] for Infectious Disease  Patient Active Problem List   Diagnosis Date Noted   Atrial fibrillation with RVR ([place]) 05/28/2018   Chronic anticoagulation 05/28/2018   NSTEMI (non-ST elevated myocardial infarction) ([place]) 05/09/2018   Ischemic cardiomyopathy 12/04/2016   Leukocytosis 07/17/2016   Pain in joint involving multiple sites 04/25/2016   Psoriatic arthritis ([place]) 02/14/2016   High risk medication use 02/14/2016   NICM (nonischemic cardiomyopathy) ([place]) 08/29/2015   Essential hypertension 08/29/2015   Abnormal CT scan, stomach    Hiatal hernia    GERD (gastroesophageal reflux disease) 08/01/2015   Weight loss, unintentional 08/01/2015   Iron deficiency anemia 06/06/2015   Heme positive stool 06/06/2015   PAF (paroxysmal atrial fibrillation) ([place]) 05/30/2015   CAD S/P percutaneous coronary angioplasty 04/07/2015   Old lateral wall myocardial infarction 04/07/2015   Acute on chronic systolic congestive heart failure ([place]) 04/07/2015   LBBB (left bundle branch block) 04/07/2015   Wide-complex tachycardia ([place]) 04/07/2015   Dyslipidemia, goal LDL below 70 03/07/2015   Psoriasis    Rheumatoid arthritis ([place])    Pulmonary fibrosis ([place]) 03/21/2011      Subjective:    Patient ID: [name], male    DOB: 1955/12/21, 65 y.o.   MRN: [id number]  Chief Complaint  Patient presents with   Follow-up    HPI:  JOCQUI DIXON is a 65 y.o. male rheumatoid/psoriatic arthritis/ild on prednisone referred to Dr West Bali for latent tb tx  Last saw dr West Bali mid 09/2020. Plan to start 3 month inh/rifapentine  Was getting orencia injection, imuran, and pred 5 mg daily. Rheumatology monitoring his immunizations  He has lots of questions about ltb treatment   No Known Allergies    Outpatient Medications Prior to Visit  Medication Sig Dispense Refill   amLODipine (NORVASC) 5 MG tablet Take 1 tablet (5 mg total) by mouth daily. 30 tablet 6   apixaban  (ELIQUIS) 5 MG TABS tablet TAKE 1 TABLET TWICE DAILY 180 tablet 1   atorvastatin (LIPITOR) 80 MG tablet TAKE 1 TABLET EVERY DAY AT 6 PM 90 tablet 3   Clobetasol Prop Emollient Base 0.05 % emollient cream APPLY TO AFFECTED AREA TWICE A DAY AS NEEDED 30 g 2   clopidogrel (PLAVIX) 75 MG tablet TAKE 1 TABLET (75 MG TOTAL) BY MOUTH DAILY. 90 tablet 3   furosemide (LASIX) 40 MG tablet TAKE 1 TABLET TWICE DAILY 180 tablet 3   losartan (COZAAR) 100 MG tablet TAKE 1 TABLET EVERY DAY. PLEASE SCHEDULE APPOINTMENT FOR FURTHER REFILLS. 90 tablet 0   pantoprazole (PROTONIX) 40 MG tablet TAKE 1 TABLET TWICE DAILY 180 tablet 1   potassium chloride SA (KLOR-CON) 20 MEQ tablet TAKE 2 TABLETS TWICE DAILY 360 tablet 3   predniSONE (DELTASONE) 5 MG tablet TAKE 1 TABLET EVERY DAY WITH BREAKFAST 90 tablet 0   sotalol (BETAPACE) 120 MG tablet TAKE 1 TABLET EVERY 12 HOURS 180 tablet 3   No facility-administered medications prior to visit.     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   Drug use: No    Comment: H/O marijuana use many years ago.   Sexual activity: Yes    Birth control/protection: None  Other Topics Concern   Not on file  Social History Narrative   Not on file   Social Determinants of Health   Financial Resource  Strain: Not on file  Food Insecurity: Not on file  Transportation Needs: Not on file  Physical Activity: Not on file  Stress: Not on file  Social Connections: Not on file  Intimate Partner Violence: Not on file      Review of Systems    Other ros negative  Objective:    BP 138/71   Pulse (!) 48   Resp 16   Ht '5\' 6"'$  (1.676 m)   Wt 194 lb (88 kg)   SpO2 98%   BMI 31.31 kg/m  Nursing note and vital signs reviewed.  Physical Exam     General/constitutional: no distress, pleasant HEENT: Normocephalic, PER, Conj Clear, EOMI, Oropharynx clear Neck supple CV: rrr no mrg Lungs: clear to auscultation, normal respiratory effort Abd: Soft, Nontender Ext: no edema Skin: No Rash Neuro: nonfocal MSK: no peripheral joint swelling/tenderness/warmth; back spines nontender    Labs: Lab Results  Component Value Date   WBC 13.2 (H) 08/02/2020   HGB 15.4 08/02/2020   HCT 48.7 08/02/2020   MCV 91.0 08/02/2020   PLT 216 [id number]   Last metabolic panel Lab Results  Component Value Date   GLUCOSE 99 08/02/2020   NA 140 08/02/2020   K 4.5 08/02/2020   CL 102 08/02/2020   CO2 32 08/02/2020   BUN 14 08/02/2020   CREATININE 0.70 09/12/2020   GFRNONAA >60 08/02/2020   GFRAA 114 03/14/2020   CALCIUM 9.3 08/02/2020   PHOS 3.2 04/07/2015   PROT 6.8 08/02/2020   ALBUMIN 3.7 08/02/2020   BILITOT 0.6 08/02/2020   ALKPHOS 57 08/02/2020   AST 27 08/02/2020   ALT 24 08/02/2020   ANIONGAP 6 08/02/2020     Micro:  Serology:  Imaging:  Assessment & Plan:   Problem List Items Addressed This Visit   None Visit Diagnoses     TB lung, latent    -  Primary         No orders of the defined types were placed in this encounter.  Discussed pathogenesis/progression tb infection Need to hold immunosuppression 4 weeks. After on inh 4 weeks given can resume immunosuppression. Ok to take low dose pred for now  Ddi so will avoid rifampicin products   -inh/b6 300/25 for  6 months -stop orencia/imuran; can resume after 4 weeks of inh/b6 -f/u 4-6 weeks with Dr West Bali to get cbc/cmp  I have spent a total of 20 minutes of face-to-face and non-face-to-face time, excluding clinical staff time, preparing to see patient, ordering tests and/or medications, and provide counseling the patient     Follow-up: Return in about 4 weeks (around 11/10/2020) for with dr West Bali.      Jabier Mutton, [facility] [phone]  pager   [phone] cell 10/13/2020, 3:36 PM

## 2020-10-20 ENCOUNTER — Telehealth: Payer: Self-pay | Admitting: *Deleted

## 2020-10-20 NOTE — Telephone Encounter (Signed)
Per Dr. Estanislado Pandy patient may increase Prednisone to 10 mg daily.  After 1 month of treatment we will be able to resume Orencia if his labs are normal. Patient's wife advised and she will advise patient. She will have him complete blood work in 1 month.

## 2020-10-20 NOTE — Telephone Encounter (Signed)
Patient has his Orencia and Imuran on hold as he is being treated for latent TB. Patient has been seen by infectious disease and just started isoniazid. Patient is on Prednisone 5 mg daily. Patient is having all over joint pain and swelling. Patient would like to know what he can do for the pain he is having since he is unable to take his medications at this time. Please advise.   Per visit with Dr. Gale Journey on 10/17/2020: Need to hold immunosuppression 4 weeks. After on inh 4 weeks given can resume immunosuppression. Ok to take low dose pred for now

## 2020-10-20 NOTE — Telephone Encounter (Signed)
Yes he should continue on prednisone 5 mg daily.  After 1 month of treatment we will be able to resume Orencia if his labs are normal.

## 2020-10-26 ENCOUNTER — Ambulatory Visit: Payer: Medicare HMO | Admitting: Infectious Diseases

## 2020-11-01 ENCOUNTER — Other Ambulatory Visit: Payer: Self-pay | Admitting: Nurse Practitioner

## 2020-11-02 ENCOUNTER — Encounter: Payer: Self-pay | Admitting: Internal Medicine

## 2020-11-02 NOTE — Telephone Encounter (Signed)
Refilled but needs follow-up.,

## 2020-11-08 ENCOUNTER — Other Ambulatory Visit: Payer: Self-pay

## 2020-11-08 ENCOUNTER — Telehealth: Payer: Self-pay

## 2020-11-08 ENCOUNTER — Other Ambulatory Visit (HOSPITAL_COMMUNITY)
Admission: RE | Admit: 2020-11-08 | Discharge: 2020-11-08 | Disposition: A | Discharge status: Home / Self Care | Payer: Medicare HMO | Source: Ambulatory Visit | Attending: Pain Medicine | Admitting: Pain Medicine

## 2020-11-08 DIAGNOSIS — Z227 Latent tuberculosis: Secondary | ICD-10-CM | POA: Diagnosis present

## 2020-11-08 LAB — CBC WITH DIFFERENTIAL/PLATELET
Abs Immature Granulocytes: 0.1 10*3/uL — ABNORMAL HIGH (ref 0.00–0.07)
Basophils Absolute: 0.1 10*3/uL (ref 0.0–0.1)
Basophils Relative: 0 %
Eosinophils Absolute: 0 10*3/uL (ref 0.0–0.5)
Eosinophils Relative: 0 %
HCT: 45.8 % (ref 39.0–52.0)
Hemoglobin: 14.6 g/dL (ref 13.0–17.0)
Immature Granulocytes: 1 %
Lymphocytes Relative: 17 %
Lymphs Abs: 2.9 10*3/uL (ref 0.7–4.0)
MCH: 28.5 pg (ref 26.0–34.0)
MCHC: 31.9 g/dL (ref 30.0–36.0)
MCV: 89.5 fL (ref 80.0–100.0)
Monocytes Absolute: 0.7 10*3/uL (ref 0.1–1.0)
Monocytes Relative: 4 %
Neutro Abs: 13 10*3/uL — ABNORMAL HIGH (ref 1.7–7.7)
Neutrophils Relative %: 78 %
Platelets: 233 10*3/uL (ref 150–400)
RBC: 5.12 MIL/uL (ref 4.22–5.81)
RDW: 13.3 % (ref 11.5–15.5)
WBC: 16.7 10*3/uL — ABNORMAL HIGH (ref 4.0–10.5)
nRBC: 0 % (ref 0.0–0.2)

## 2020-11-08 LAB — COMPREHENSIVE METABOLIC PANEL
ALT: 31 U/L (ref 0–44)
AST: 35 U/L (ref 15–41)
Albumin: 3.6 g/dL (ref 3.5–5.0)
Alkaline Phosphatase: 68 U/L (ref 38–126)
Anion gap: 7 (ref 5–15)
BUN: 16 mg/dL (ref 8–23)
CO2: 28 mmol/L (ref 22–32)
Calcium: 9.4 mg/dL (ref 8.9–10.3)
Chloride: 103 mmol/L (ref 98–111)
Creatinine, Ser: 0.73 mg/dL (ref 0.61–1.24)
GFR, Estimated: >60 mL/min (ref >60)
Glucose, Bld: 108 mg/dL — ABNORMAL HIGH (ref 70–99)
Potassium: 4.6 mmol/L (ref 3.5–5.1)
Sodium: 138 mmol/L (ref 135–145)
Total Bilirubin: 0.6 mg/dL (ref 0.3–1.2)
Total Protein: 7.2 g/dL (ref 6.5–8.1)

## 2020-11-08 NOTE — Telephone Encounter (Addendum)
Lab orders for CBC and CMP faxed to [facility] outpatient lab per Dr. Hart [place] office visit notes  [phone] (fax number)  Patients wife has been notified of orders being sent.   Indiana Pechacek Lorita Officer, RN

## 2020-11-15 ENCOUNTER — Telehealth: Payer: Self-pay | Admitting: Cardiovascular Disease

## 2020-11-15 NOTE — Telephone Encounter (Signed)
*  STAT* If patient is at the pharmacy, call can be transferred to refill team.   1. Which medications need to be refilled? (please list name of each medication and dose if known)  nwws   new  prescription for Losartan  2. Which pharmacy/location (including street and city if local pharmacy) is medication to be sent to? [facility]  3. Do they need a 30 day or 90 day supply? 90 days and refills

## 2020-11-16 ENCOUNTER — Ambulatory Visit: Payer: Medicare HMO | Admitting: Infectious Diseases

## 2020-11-16 MED ORDER — LOSARTAN POTASSIUM 100 MG PO TABS: 100.0000 mg | ORAL_TABLET | Freq: Every day | ORAL | 0 refills | Status: DC

## 2020-11-16 NOTE — Addendum Note (Signed)
Addended by: Wonda Horner on: 11/16/2020 10:48 AM   Modules accepted: Orders

## 2020-11-24 MED ORDER — LOSARTAN POTASSIUM 100 MG PO TABS: 100.0000 mg | ORAL_TABLET | Freq: Every day | ORAL | 0 refills | Status: DC

## 2020-11-24 NOTE — Addendum Note (Signed)
Addended by: Pixie Casino on: 11/24/2020 11:21 AM   Modules accepted: Orders

## 2020-12-01 ENCOUNTER — Ambulatory Visit (INDEPENDENT_AMBULATORY_CARE_PROVIDER_SITE_OTHER): Payer: Medicare HMO | Admitting: Infectious Diseases

## 2020-12-01 ENCOUNTER — Other Ambulatory Visit: Payer: Self-pay

## 2020-12-01 DIAGNOSIS — Z227 Latent tuberculosis: Secondary | ICD-10-CM

## 2020-12-01 DIAGNOSIS — Z5181 Encounter for therapeutic drug level monitoring: Secondary | ICD-10-CM | POA: Diagnosis not present

## 2020-12-01 MED ORDER — VITAMIN B-6 50 MG PO TABS: 50.0000 mg | ORAL_TABLET | Freq: Every day | ORAL | 2 refills | Status: DC

## 2020-12-01 MED ORDER — ISONIAZID 300 MG PO TABS: 300.0000 mg | ORAL_TABLET | Freq: Every day | ORAL | 2 refills | Status: DC

## 2020-12-01 NOTE — Progress Notes (Signed)
Virtual Visit via Telephone Note  I connected withNAME@ on 12/01/20 at  2:45 PM EDT by a telephone enabled telemedicine application and verified that I am speaking with the correct person using two identifiers.  Location: Patient: Home  Provider: [facility]   I discussed the limitations of evaluation and management by telemedicine and the availability of in person appointments. The patient expressed understanding and agreed to proceed.  [facility] for Infectious Disease  Patient Active Problem List   Diagnosis Date Noted   Atrial fibrillation with RVR ([place]) 05/28/2018   Chronic anticoagulation 05/28/2018   NSTEMI (non-ST elevated myocardial infarction) ([place]) 05/09/2018   Ischemic cardiomyopathy 12/04/2016   Leukocytosis 07/17/2016   Pain in joint involving multiple sites 04/25/2016   Psoriatic arthritis ([place]) 02/14/2016   High risk medication use 02/14/2016   NICM (nonischemic cardiomyopathy) ([place]) 08/29/2015   Essential hypertension 08/29/2015   Abnormal CT scan, stomach    Hiatal hernia    GERD (gastroesophageal reflux disease) 08/01/2015   Weight loss, unintentional 08/01/2015   Iron deficiency anemia 06/06/2015   Heme positive stool 06/06/2015   PAF (paroxysmal atrial fibrillation) ([place]) 05/30/2015   CAD S/P percutaneous coronary angioplasty 04/07/2015   Old lateral wall myocardial infarction 04/07/2015   Acute on chronic systolic congestive heart failure ([place]) 04/07/2015   LBBB (left bundle branch block) 04/07/2015   Wide-complex tachycardia ([place]) 04/07/2015   Dyslipidemia, goal LDL below 70 03/07/2015   Psoriasis    Rheumatoid arthritis ([place])    Pulmonary fibrosis ([place]) 03/21/2011    Patient's Medications  New Prescriptions   No medications on file  Previous Medications   AMLODIPINE (NORVASC) 5 MG TABLET    Take 1 tablet (5 mg total) by mouth daily.   APIXABAN (ELIQUIS) 5 MG TABS TABLET    TAKE 1 TABLET TWICE DAILY   ATORVASTATIN (LIPITOR) 80 MG TABLET    TAKE 1  TABLET EVERY DAY AT 6 PM   CLOBETASOL PROP EMOLLIENT BASE 0.05 % EMOLLIENT CREAM    APPLY TO AFFECTED AREA TWICE A DAY AS NEEDED   CLOPIDOGREL (PLAVIX) 75 MG TABLET    TAKE 1 TABLET (75 MG TOTAL) BY MOUTH DAILY.   FUROSEMIDE (LASIX) 40 MG TABLET    TAKE 1 TABLET TWICE DAILY   LOSARTAN (COZAAR) 100 MG TABLET    Take 1 tablet (100 mg total) by mouth daily.   PANTOPRAZOLE (PROTONIX) 40 MG TABLET    TAKE 1 TABLET TWICE DAILY   POTASSIUM CHLORIDE SA (KLOR-CON) 20 MEQ TABLET    TAKE 2 TABLETS TWICE DAILY   PREDNISONE (DELTASONE) 5 MG TABLET    TAKE 1 TABLET EVERY DAY WITH BREAKFAST   SOTALOL (BETAPACE) 120 MG TABLET    TAKE 1 TABLET EVERY 12 HOURS  Modified Medications   No medications on file  Discontinued Medications   No medications on file    History of Present Illness: Here for follow up for latent TB. Patient tells me he started taking Isoniazid and Pyridoxine after his office visit with Dr Gale Journey on 8/12 and has taken approx a month of tx without any issues. He has not taken both meds for a week as he has ran out of it. I have sent a refill for Isoniazid and Pyridoxine after confirmation of his pharmacy. He is going to pick it up today and start taking it. Denies any side effects while taking the medications like nausea, vomiting, abdominal pain and diarrhea. Appetite is good. Last lab work 9/7 CMP was unremarkable. Discussed  with him to continue taking Isoniazid and Pyridoxine and follow up in 1 month - agrees to the plan.  ROS 10 point ROS done with pertinent positives and negatives listed above   Past Medical History:  Diagnosis Date   Abnormal CT scan, stomach    Chronic systolic dysfunction of left ventricle    Coronary artery disease    lateral STEMI 02/22/15 3/10 cutting balloon to ISR of the o/pLCX   GERD (gastroesophageal reflux disease)    Hyperlipidemia    Hypertension    Ischemic cardiomyopathy    LBBB (left bundle branch block)    Leukocytosis    followed by hematology,  reactive   Lymphadenopathy    Myocardial infarction ([place]) 02/2015   Psoriasis 2003   Psoriatic arthritis ([place])    Pulmonary fibrosis ([place])    Rheumatoid arthritis(714.0) 2012   Typical atrial flutter ([facility])     Social History   Tobacco Use   Smoking status: Former    Packs/day: 1.00    Years: 30.00    Pack years: 30.00    Types: Cigarettes    Quit date: 03/21/2003    Years since quitting: 17.7   Smokeless tobacco: Never  Vaping Use   Vaping Use: Never used  Substance Use Topics   Alcohol use: No    Alcohol/week: 0.0 standard drinks    Comment: H/O case of beer weekly x 20 years, quiting in 2000-ish.   Drug use: No    Comment: H/O marijuana use many years ago.    Family History  Problem Relation Age of Onset   Hypertension Mother    Colon cancer Neg Hx     No Known Allergies  Health Maintenance  Topic Date Due   TETANUS/TDAP  Never done   Zoster Vaccines- Shingrix (1 of 2) Never done   INFLUENZA VACCINE  10/02/2020   COVID-19 Vaccine (5 - Booster for Moderna series) 12/09/2020   COLONOSCOPY (Pts 45-60yrs Insurance coverage will need to be confirmed)  08/23/2025   Hepatitis C Screening  Completed   HPV VACCINES  Aged Out    Observations/Objective:   Assessment and Plan: Latent TB  RA Psoriatic Arthritis  Follows with Rheumatology, on low dose prednisone  Pulmonary Fibrosis/ILD Follows with Pulmonary    CAD and Ischemic Cadiomyopathy/CHF PAF  Follows with Cardiology    Follow Up Instructions: Patient is out of Isoniazid and Vitamin b6 for a week now, meds refilled, advised to take it asap Recent CMP unremarkable Follow up with Rheumatology  Follow up with [place] in 1 month  I discussed the assessment and treatment plan with the patient. The patient was provided an opportunity to ask questions and all were answered. The patient agreed with the plan and demonstrated an understanding of the instructions.   The patient was advised to call back or seek an  in-person evaluation if the symptoms worsen or if the condition fails to improve as anticipated.  I provided 20 minutes of non-face-to-face time during this encounter.  Wilber Oliphant, [facility] Phone [phone] Fax no. [phone]  12/01/2020, 2:53 PM

## 2020-12-01 NOTE — Progress Notes (Signed)
Patient accepts 1 month follow up visit on 01/05/21 at 10:45 am.  Curtis Flores

## 2020-12-08 NOTE — Progress Notes (Signed)
Office Visit Note  Patient: Curtis Flores             Date of Birth: May 13, 1955           MRN: [id number]             PCP: Chesley Noon, MD Referring: Chesley Noon, MD Visit Date: 12/22/2020 Occupation: @GUAROCC @  Subjective:  Discuss restarting orencia   History of Present Illness: Curtis Flores is a 65 y.o. male with history of RA and PsA.  He is currently taking prednisone 10 mg daily for management of his symptoms.  He has been off of Orencia and Imuran since June due to being diagnosed with latent TB.  He established care with Dr. West Bali on 08/15/20 and was initiated on INH/B6 after his appointment with Dr. Gale Journey on 10/13/20.  He has been tolerating INH without any side effects.  His next appointment with ID as scheduled on 01/02/2021.  He also has an upcoming appointment with Dr. Elsworth Soho on 01/12/2021.  He denies any increased shortness of breath or new pulmonary symptoms.  Most recent chest CT was on 09/12/2020 and most recent PFTs were on 05/16/2020. He states that while being off of Orencia and Imuran he has developed increased joint pain and stiffness.  His arthralgias have been migratory but typically affect his right wrist and both shoulders.  He continues to have psoriasis on his scalp and has been using topical agents.   Activities of Daily Living:  Patient reports morning stiffness for 1 hour.   Patient Denies nocturnal pain.  Difficulty dressing/grooming: Denies Difficulty climbing stairs: Denies Difficulty getting out of chair: Denies Difficulty using hands for taps, buttons, cutlery, and/or writing: Denies  Review of Systems  Constitutional:  Positive for fatigue.  HENT:  Positive for mouth dryness.   Eyes:  Positive for dryness.  Respiratory:  Positive for shortness of breath.   Cardiovascular:  Positive for swelling in legs/feet.  Gastrointestinal:  Negative for constipation.  Endocrine: Negative for increased urination.  Genitourinary:  Negative for  difficulty urinating.  Musculoskeletal:  Positive for joint pain, joint pain, muscle weakness and morning stiffness.  Skin:  Negative for rash.  Allergic/Immunologic: Negative for susceptible to infections.  Neurological:  Negative for numbness.  Hematological:  Positive for bruising/bleeding tendency.  Psychiatric/Behavioral:  Negative for sleep disturbance.    PMFS History:  Patient Active Problem List   Diagnosis Date Noted   Medication monitoring encounter 12/01/2020   TB lung, 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   Atrial fibrillation with RVR ([place]) 05/28/2018   Chronic anticoagulation 05/28/2018   NSTEMI (non-ST elevated myocardial infarction) ([place]) 05/09/2018   Ischemic cardiomyopathy 12/04/2016   Leukocytosis 07/17/2016   Pain in joint involving multiple sites 04/25/2016   Psoriatic arthritis ([place]) 02/14/2016   High risk medication use 02/14/2016   NICM (nonischemic cardiomyopathy) ([place]) 08/29/2015   Essential hypertension 08/29/2015   Abnormal CT scan, stomach    Hiatal hernia    GERD (gastroesophageal reflux disease) 08/01/2015   Weight loss, unintentional 08/01/2015   Iron deficiency anemia 06/06/2015   Heme positive stool 06/06/2015   PAF (paroxysmal atrial fibrillation) ([place]) 05/30/2015   CAD S/P percutaneous coronary angioplasty 04/07/2015   Old lateral wall myocardial infarction 04/07/2015   Acute on chronic systolic congestive heart failure ([place]) 04/07/2015   LBBB (left bundle branch block) 04/07/2015   Wide-complex tachycardia 04/07/2015   Dyslipidemia, goal LDL below 70 03/07/2015   Psoriasis    Rheumatoid arthritis ([place])  Pulmonary fibrosis ([place]) 03/21/2011    Past Medical History:  Diagnosis Date   Abnormal CT scan, stomach    Chronic systolic dysfunction of left ventricle    Coronary artery disease    lateral STEMI 02/22/15 3/10 cutting balloon to ISR of the o/pLCX   GERD (gastroesophageal reflux disease)    Hyperlipidemia    Hypertension    Ischemic  cardiomyopathy    LBBB (left bundle branch block)    Leukocytosis    followed by hematology, reactive   Lymphadenopathy    Myocardial infarction ([place]) 02/2015   Psoriasis 2003   Psoriatic arthritis ([place])    Pulmonary fibrosis ([place])    Rheumatoid arthritis(714.0) 2012   Typical atrial flutter ([place])     Family History  Problem Relation Age of Onset   Hypertension Mother    Colon cancer Neg Hx    Past Surgical History:  Procedure Laterality Date   CARDIAC CATHETERIZATION N/A 02/22/2015   Procedure: Left Heart Cath and Coronary Angiography;  Surgeon: Lorretta Harp, MD;  Location: [facility];  Service: Cardiovascular;  Laterality: N/A;   CARDIAC CATHETERIZATION N/A 02/22/2015   Procedure: Coronary Stent Intervention;  Surgeon: Lorretta Harp, MD;  Location: [facility];  Service: Cardiovascular;  Laterality: N/A;   CARDIOVERSION N/A 04/12/2015   Procedure: CARDIOVERSION;  Surgeon: Larey Dresser, MD;  Location: [facility] ENDOSCOPY;  Service: Cardiovascular;  Laterality: N/A;   COLONOSCOPY  07/01/2003   WGN:FAOZHY colonic mucosa except for the proximal right colon in the area of ICV which was not seen completely due to inadequate bowel prep. followed with ACBE which was normal.    COLONOSCOPY N/A 08/24/2015   Dr. Gala Romney: Normal colon. Next colonoscopy in 10 years.   CORONARY BALLOON ANGIOPLASTY N/A 05/12/2018   Procedure: CORONARY BALLOON ANGIOPLASTY;  Surgeon: Belva Crome, MD;  Location: [facility];  Service: Cardiovascular;  Laterality: N/A;   ELECTROPHYSIOLOGIC STUDY N/A 05/30/2015   Atrial fibrillation ablation by Dr Rayann Heman   ESOPHAGOGASTRODUODENOSCOPY N/A 08/24/2015   Dr. Gala Romney: Medium-sized hiatal hernia, erosive gastropathy. [place] lesions. Esophageal mucosa distally suggestive of short segment Barrett's esophagus. Not confirmed on biopsy. Gastric biopsy with minimal chronic inflammation   GIVENS CAPSULE STUDY N/A 04/17/2016   Procedure: GIVENS CAPSULE STUDY;   Surgeon: Daneil Dolin, MD;  [facility];  Service: Endoscopy;  Laterality: N/A;  Pt to arrive at 8:00 am for 8:30 am appt   LEFT HEART CATH AND CORONARY ANGIOGRAPHY N/A 05/11/2018   Procedure: LEFT HEART CATH AND CORONARY ANGIOGRAPHY;  Surgeon: Belva Crome, MD;  Location: [facility];  Service: Cardiovascular;  Laterality: N/A;   TEE WITHOUT CARDIOVERSION N/A 04/12/2015   Procedure: TRANSESOPHAGEAL ECHOCARDIOGRAM (TEE);  Surgeon: Larey Dresser, MD;  Location: [facility] ENDOSCOPY;  Service: Cardiovascular;  Laterality: N/A;   Social History   Social History Narrative   Not on file   Immunization History  Administered Date(s) Administered   Fluad Quad(high Dose 65+) 12/09/2018   Influenza Split 12/22/2010   Influenza Whole 01/18/2010   Influenza, High Dose Seasonal PF 11/11/2017, 12/02/2018   Influenza, Seasonal, Injecte, Preservative Fre 12/03/2014, 12/13/2015, 12/03/2017   Influenza,inj,Quad PF,6+ Mos 11/29/2019   Influenza-Unspecified 12/03/2014, 12/13/2015, 11/12/2016   Moderna Sars-Covid-2 Vaccination 05/08/2019, 06/09/2019, 01/29/2020, 08/09/2020   Pneumococcal Conjugate-13 08/20/2016, 04/07/2018   Pneumococcal Polysaccharide-23 01/22/2011, 03/05/2011   Pneumococcal-Unspecified 01/22/2011, 03/05/2011     Objective: Vital Signs: BP (!) 153/72 (BP Location: Left Arm, Patient Position: Sitting,  Cuff Size: Small)   Pulse (!) 43   Resp 12   Ht 5\' 6"  (1.676 m)   Wt 194 lb (88 kg)   BMI 31.31 kg/m    Physical Exam Vitals and nursing note reviewed.  Constitutional:      Appearance: He is well-developed.  HENT:     Head: Normocephalic and atraumatic.  Eyes:     Conjunctiva/sclera: Conjunctivae normal.     Pupils: Pupils are equal, round, and reactive to light.  Pulmonary:     Effort: Pulmonary effort is normal.  Abdominal:     Palpations: Abdomen is soft.  Musculoskeletal:     Cervical back: Normal range of motion and neck supple.  Skin:    General: Skin  is warm and dry.     Capillary Refill: Capillary refill takes less than 2 seconds.  Neurological:     Mental Status: He is alert and oriented to person, place, and time.  Psychiatric:        Behavior: Behavior normal.     Musculoskeletal Exam: C-spine is limited range of motion with lateral rotation.  Painful range of motion of both shoulder joints especially the right shoulder.  Elbow joints have good range of motion with no tenderness or inflammation.  Slightly limited extension of both wrist joints with some tenderness in the right wrist.  Tenderness and synovitis of the right second and fifth MCP joints.  Tenderness and synovitis of the right third PIP joint.  Hip joints have good ROM with no discomfort.  Some warmth in the left knee.  Right knee has good ROM with no tenderness or inflammation.  Ankle joints have good ROM with no tenderness or joint swelling.   CDAI Exam: CDAI Score: 9.8  Patient Global: 4 mm; Provider Global: 4 mm Swollen: 3 ; Tender: 6  Joint Exam 12/22/2020      Right  Left  Glenohumeral   Tender     Wrist   Tender     MCP 2  Swollen Tender     MCP 5  Swollen Tender  Swollen Tender  Knee      Tender     Investigation: No additional findings.  Imaging: No results found.  Recent Labs: Lab Results  Component Value Date   WBC 17.9 (H) 12/20/2020   HGB 15.2 12/20/2020   PLT 246 12/20/2020   NA 138 12/20/2020   K 4.6 12/20/2020   CL 103 12/20/2020   CO2 30 12/20/2020   GLUCOSE 105 (H) 12/20/2020   BUN 12 12/20/2020   CREATININE 0.79 12/20/2020   BILITOT 0.4 12/20/2020   ALKPHOS 70 12/20/2020   AST 38 12/20/2020   ALT 38 12/20/2020   PROT 7.2 12/20/2020   ALBUMIN 3.6 12/20/2020   CALCIUM 9.0 12/20/2020   GFRAA 114 03/14/2020   QFTBGOLDPLUS Positive (A) 08/02/2020    Speciality Comments: No specialty comments available.  Procedures:  No procedures performed Allergies: Patient has no known allergies.   Assessment / Plan:     Visit  Diagnoses: Rheumatoid arthritis involving multiple sites with positive rheumatoid factor (HCC) - Severe disease: He presents today with active synovitis and tenderness in several joints as described above.  He has been experiencing intermittent flares since discontinuing Orencia and Imuran.  TB Gold was positive on 08/02/2020 at which time he was advised to hold both Orencia and Imuran.  He established care with ID and underwent a thorough work-up and was started on INH/B6 by Dr. Gale Journey on 10/13/20.  He was previously on long-term prednisone 5 mg daily for several years but increased the dose of prednisone to 10 mg daily as advised on 10/20/2020 while off of imuran and orencia.  He continues to have recurrent flares despite taking prednisone 10 mg daily.  The recommendation from ID was to continue to hold Orencia and Imuran for 4 weeks after initiating INH. Since he has active synovitis he will restart on orencia today in the office. Discussed with Dr. Estanislado Pandy and she was in agreement.  We will hold off on restarting Imuran at this time. He will remain on prednisone 10 mg daily for now. We will discuss a taper at his follow up visit.  He will return for lab work in 1 month including CBC and CMP.  He will follow up in 3 months.   - Plan: Abatacept SOAJ 125 mg  Psoriatic arthritis ([place]) - He has active synovitis as described above.  He continues to have psoriasis on his scalp and has been using clobetasol cream topically as needed.  He has been off both Imuran and Orencia since June 2022 due to positive TB Gold.  He initiated INH/B6 after his office visit on 10/13/2020.  He has been tolerating therapy without any side effects.  The recommendation was to continue to hold Imuran and Orencia for 4 weeks after the initiation of INH.  He has been on INH for longer than 4 weeks so we will restart Orencia at this time.  We will hold off on restarting Imuran currently.  Plan: Abatacept SOAJ 125 mg  Psoriasis - Scalp  psoriasis-Unchanged.  He uses clobetasol cream topically as needed.  He was restarted on Orencia today.  High risk medication use - Orencia 125 mg sq weekly injections (Initiated 10/11/2016).  Maureen Chatters was restarted today in the office due to his gap in therapy since June 2022. Monitored for any signs or symptoms of a reaction. He will continue to hold Imuran until completing [place] therapy. He has been on long term prednisone 5 mg daily for many years but increased the dose of prednisone to 10 mg daily as advised on 10/20/20 while he was off of orencia and imuran.  (Inadequate response to Enbrel, Humira, Cosyntex, and Otezla).  Discussed the importance of holding Orencia if he develops signs or symptoms of an infection.   Pulmonary fibrosis ([place]): Followed by Dr. Elsworth Soho.  PFTs updated 05/16/2020.  Chest CT updated on 09/12/2020 which did not reveal any significant interval change and moderate pulmonary fibrosis.  He has not been experiencing any new or worsening pulmonary symptoms. Currently holding Imuran while being treated for latent TB. He has an upcoming appointment with Dr. Elsworth Soho on 01/12/21.   Positive QuantiFERON-TB Gold test -TB Gold positive on 08/02/2020 at which time I advised him to hold Orencia and Imuran.  He was evaluated by Dr. West Bali on 08/15/20.  He had a chest CT performed on 09/12/2020 and followed back up with Dr. West Bali on 09/15/2020 to discuss results and next steps.  On 10/13/20 he was evaluated by Dr. Gale Journey who initiated INH/B6 which will be continued for 6 months with close lab monitoring.  There was recommendation to hold Imuran and Orencia for 4 weeks at the initiation of INH therapy.  He has been holding [place] and imuran since June.   He has active synovitis on examination today despite being on prednisone 10 mg daily. Maureen Chatters was restarted today in the office.  We will hold off on restarting Imuran at this  time.  He will return for updated lab work in 1 month including CBC and  CMP. He has a follow-up appointment with Dr. West Bali on 01/02/21.   Osteopenia of multiple sites: DEXA updated on 04/28/2020: FRAX not reported because of the T-scores for spine total, hip total, femoral neck at or above -1.0.   DEXA on 12/19/17 showed T score of -1.6 at left femur neck  -5% change in BMD. DEXA 04/28/20 T-score: -0.8, BMD: 0.827 left femoral neck No recent falls or fractures.  Repeat DEXA in 2 years due to long term prednisone use.   Other medical conditions are listed as follows:   History of bronchiectasis: He has an upcoming appointment with Dr. Elsworth Soho on 01/12/21.   History of congestive heart failure: Followed by Dr. [name].   History of gastroesophageal reflux (GERD)  History of hyperlipidemia  History of coronary artery disease  History of atrial fibrillation  History of hypertension  Orders: No orders of the defined types were placed in this encounter.  Meds ordered this encounter  Medications   Abatacept SOAJ 125 mg      Follow-Up Instructions: Return in 3 months (on 03/24/2021) for Rheumatoid arthritis, Psoriatic arthritis.   Ofilia Neas, PA-C  Note - This record has been created using Dragon software.  Chart creation errors have been sought, but may not always  have been located. Such creation errors do not reflect on  the standard of medical care.

## 2020-12-15 ENCOUNTER — Other Ambulatory Visit: Payer: Self-pay | Admitting: *Deleted

## 2020-12-15 DIAGNOSIS — L409 Psoriasis, unspecified: Secondary | ICD-10-CM

## 2020-12-15 DIAGNOSIS — L4050 Arthropathic psoriasis, unspecified: Secondary | ICD-10-CM

## 2020-12-15 DIAGNOSIS — Z79899 Other long term (current) drug therapy: Secondary | ICD-10-CM

## 2020-12-15 DIAGNOSIS — M0579 Rheumatoid arthritis with rheumatoid factor of multiple sites without organ or systems involvement: Secondary | ICD-10-CM

## 2020-12-15 MED ORDER — PREDNISONE 5 MG PO TABS: ORAL_TABLET | ORAL | 0 refills | Status: DC

## 2020-12-15 NOTE — Telephone Encounter (Signed)
Refill request received via fax  Next Visit: 12/22/2020  Last Visit: 09/22/2020  Last Fill: 07/28/2020  Dx: Rheumatoid arthritis involving multiple sites with positive rheumatoid factor Psoriatic arthritis   Current Dose per office note on 09/22/2020: prednisone 5 mg p.o. daily  Okay to refill Prednisone?

## 2020-12-19 ENCOUNTER — Other Ambulatory Visit: Payer: Self-pay | Admitting: *Deleted

## 2020-12-19 ENCOUNTER — Telehealth: Payer: Self-pay

## 2020-12-19 DIAGNOSIS — L4050 Arthropathic psoriasis, unspecified: Secondary | ICD-10-CM

## 2020-12-19 DIAGNOSIS — Z79899 Other long term (current) drug therapy: Secondary | ICD-10-CM

## 2020-12-19 DIAGNOSIS — M0579 Rheumatoid arthritis with rheumatoid factor of multiple sites without organ or systems involvement: Secondary | ICD-10-CM

## 2020-12-19 DIAGNOSIS — L409 Psoriasis, unspecified: Secondary | ICD-10-CM

## 2020-12-19 NOTE — Telephone Encounter (Signed)
Lab Orders released.  

## 2020-12-19 NOTE — Telephone Encounter (Signed)
[name] called requesting patient's labwork orders be sent to [place] at [facility].  He will be going tomorrow, 12/20/20.

## 2020-12-20 ENCOUNTER — Other Ambulatory Visit (HOSPITAL_COMMUNITY)
Admission: RE | Admit: 2020-12-20 | Discharge: 2020-12-20 | Disposition: A | Discharge status: Home / Self Care | Payer: Medicare HMO | Source: Ambulatory Visit | Attending: Physician Assistant | Admitting: Physician Assistant

## 2020-12-20 DIAGNOSIS — L409 Psoriasis, unspecified: Secondary | ICD-10-CM | POA: Insufficient documentation

## 2020-12-20 DIAGNOSIS — L4050 Arthropathic psoriasis, unspecified: Secondary | ICD-10-CM | POA: Diagnosis present

## 2020-12-20 DIAGNOSIS — M0579 Rheumatoid arthritis with rheumatoid factor of multiple sites without organ or systems involvement: Secondary | ICD-10-CM | POA: Insufficient documentation

## 2020-12-20 DIAGNOSIS — Z79899 Other long term (current) drug therapy: Secondary | ICD-10-CM | POA: Insufficient documentation

## 2020-12-20 LAB — CBC WITH DIFFERENTIAL/PLATELET
Abs Immature Granulocytes: 0.18 10*3/uL — ABNORMAL HIGH (ref 0.00–0.07)
Basophils Absolute: 0.1 10*3/uL (ref 0.0–0.1)
Basophils Relative: 1 %
Eosinophils Absolute: 0.1 10*3/uL (ref 0.0–0.5)
Eosinophils Relative: 0 %
HCT: 48.7 % (ref 39.0–52.0)
Hemoglobin: 15.2 g/dL (ref 13.0–17.0)
Immature Granulocytes: 1 %
Lymphocytes Relative: 16 %
Lymphs Abs: 2.8 10*3/uL (ref 0.7–4.0)
MCH: 28.1 pg (ref 26.0–34.0)
MCHC: 31.2 g/dL (ref 30.0–36.0)
MCV: 90 fL (ref 80.0–100.0)
Monocytes Absolute: 0.7 10*3/uL (ref 0.1–1.0)
Monocytes Relative: 4 %
Neutro Abs: 14 10*3/uL — ABNORMAL HIGH (ref 1.7–7.7)
Neutrophils Relative %: 78 %
Platelets: 246 10*3/uL (ref 150–400)
RBC: 5.41 MIL/uL (ref 4.22–5.81)
RDW: 13.5 % (ref 11.5–15.5)
WBC: 17.9 10*3/uL — ABNORMAL HIGH (ref 4.0–10.5)
nRBC: 0 % (ref 0.0–0.2)

## 2020-12-20 LAB — COMPREHENSIVE METABOLIC PANEL
ALT: 38 U/L (ref 0–44)
AST: 38 U/L (ref 15–41)
Albumin: 3.6 g/dL (ref 3.5–5.0)
Alkaline Phosphatase: 70 U/L (ref 38–126)
Anion gap: 5 (ref 5–15)
BUN: 12 mg/dL (ref 8–23)
CO2: 30 mmol/L (ref 22–32)
Calcium: 9 mg/dL (ref 8.9–10.3)
Chloride: 103 mmol/L (ref 98–111)
Creatinine, Ser: 0.79 mg/dL (ref 0.61–1.24)
GFR, Estimated: >60 mL/min (ref >60)
Glucose, Bld: 105 mg/dL — ABNORMAL HIGH (ref 70–99)
Potassium: 4.6 mmol/L (ref 3.5–5.1)
Sodium: 138 mmol/L (ref 135–145)
Total Bilirubin: 0.4 mg/dL (ref 0.3–1.2)
Total Protein: 7.2 g/dL (ref 6.5–8.1)

## 2020-12-20 NOTE — Progress Notes (Signed)
We will continue to monitor lab work closely.

## 2020-12-20 NOTE — Progress Notes (Signed)
Glucose is 105. Rest of CMP WNL.  WBC count remains elevated and has trended up.  Absolute neutrophils and absolute immature granulocytes are elevated.  Please clarify if he has seen hematology in the past.

## 2020-12-22 ENCOUNTER — Ambulatory Visit (INDEPENDENT_AMBULATORY_CARE_PROVIDER_SITE_OTHER): Payer: Medicare HMO | Admitting: Physician Assistant

## 2020-12-22 ENCOUNTER — Encounter: Payer: Self-pay | Admitting: Physician Assistant

## 2020-12-22 ENCOUNTER — Other Ambulatory Visit: Payer: Self-pay

## 2020-12-22 ENCOUNTER — Other Ambulatory Visit: Payer: Self-pay | Admitting: Physician Assistant

## 2020-12-22 VITALS — BP 153/72 | HR 43 | Resp 12 | Ht 66.0 in | Wt 194.0 lb

## 2020-12-22 DIAGNOSIS — Z79899 Other long term (current) drug therapy: Secondary | ICD-10-CM

## 2020-12-22 DIAGNOSIS — Z8709 Personal history of other diseases of the respiratory system: Secondary | ICD-10-CM

## 2020-12-22 DIAGNOSIS — M0579 Rheumatoid arthritis with rheumatoid factor of multiple sites without organ or systems involvement: Secondary | ICD-10-CM

## 2020-12-22 DIAGNOSIS — M8589 Other specified disorders of bone density and structure, multiple sites: Secondary | ICD-10-CM

## 2020-12-22 DIAGNOSIS — R7612 Nonspecific reaction to cell mediated immunity measurement of gamma interferon antigen response without active tuberculosis: Secondary | ICD-10-CM

## 2020-12-22 DIAGNOSIS — L4050 Arthropathic psoriasis, unspecified: Secondary | ICD-10-CM

## 2020-12-22 DIAGNOSIS — Z8639 Personal history of other endocrine, nutritional and metabolic disease: Secondary | ICD-10-CM

## 2020-12-22 DIAGNOSIS — J8410 Pulmonary fibrosis, unspecified: Secondary | ICD-10-CM

## 2020-12-22 DIAGNOSIS — Z8719 Personal history of other diseases of the digestive system: Secondary | ICD-10-CM

## 2020-12-22 DIAGNOSIS — L409 Psoriasis, unspecified: Secondary | ICD-10-CM

## 2020-12-22 DIAGNOSIS — Z8679 Personal history of other diseases of the circulatory system: Secondary | ICD-10-CM

## 2020-12-22 MED ORDER — ABATACEPT 125 MG/ML ~~LOC~~ SOAJ
125.0000 mg | Freq: Once | SUBCUTANEOUS | Status: AC
  Administered 2020-12-22: 125 mg via SUBCUTANEOUS

## 2020-12-22 MED ORDER — ORENCIA CLICKJECT 125 MG/ML ~~LOC~~ SOAJ: 125.0000 mg | SUBCUTANEOUS | 0 refills | Status: DC

## 2020-12-22 NOTE — Progress Notes (Signed)
Patient restarted on Orencia today while in office per Hazel Sams, PA-C.  Patient received injection in left lower abdomen and tolerated well. Patient was monitored in the office for 30 minutes for adverse reaction and none were noted.   Administrations This Visit     Abatacept SOAJ 125 mg     Admin Date 12/22/2020 Action Given Dose 125 mg Route Subcutaneous Administered By Carole Binning, LPN

## 2020-12-22 NOTE — Telephone Encounter (Signed)
I called the patient to review her current plan.  We will continue on Orencia 125 mg subcutaneous injections once weekly.  A refill of Orencia will be sent to the pharmacy today.  We will hold off on restarting Imuran at this time.  His wife and the patient voiced understanding.  He was advised to follow-up for lab work including CBC and CMP in 1 month.  All questions were addressed.    Hazel Sams, PA-C

## 2020-12-25 ENCOUNTER — Telehealth: Payer: Self-pay | Admitting: Pharmacist

## 2020-12-25 NOTE — Telephone Encounter (Signed)
Patient restarted Orencia at [facility] on 12/22/20. Spoke with patient's wife today and have advised that I will mail [employer] PAP renewal application for approval of Orencia for calendar year 2023 to their home.  Provider portion placed in folder with insurance card copy and med list to be signed   Knox Saliva, PharmD, MPH, BCPS Clinical Pharmacist (Rheumatology and Pulmonology)

## 2020-12-27 NOTE — Telephone Encounter (Signed)
Provider portion of [employer] PAP application for Orencia received. Filed in pending PAP info folder in pharmacy office along with insurance card copy and med list  Knox Saliva, PharmD, MPH, BCPS Clinical Pharmacist (Rheumatology and Pulmonology)

## 2020-12-28 ENCOUNTER — Other Ambulatory Visit: Payer: Self-pay | Admitting: Infectious Diseases

## 2021-01-02 ENCOUNTER — Ambulatory Visit: Payer: Medicare HMO | Admitting: Infectious Diseases

## 2021-01-05 ENCOUNTER — Ambulatory Visit: Payer: Medicare HMO | Admitting: Infectious Diseases

## 2021-01-10 ENCOUNTER — Other Ambulatory Visit: Payer: Self-pay | Admitting: Physician Assistant

## 2021-01-10 DIAGNOSIS — L4050 Arthropathic psoriasis, unspecified: Secondary | ICD-10-CM

## 2021-01-10 DIAGNOSIS — L409 Psoriasis, unspecified: Secondary | ICD-10-CM

## 2021-01-10 DIAGNOSIS — M0579 Rheumatoid arthritis with rheumatoid factor of multiple sites without organ or systems involvement: Secondary | ICD-10-CM

## 2021-01-10 DIAGNOSIS — Z79899 Other long term (current) drug therapy: Secondary | ICD-10-CM

## 2021-01-12 ENCOUNTER — Ambulatory Visit: Payer: Medicare HMO | Admitting: Pulmonary Disease

## 2021-01-19 ENCOUNTER — Ambulatory Visit (INDEPENDENT_AMBULATORY_CARE_PROVIDER_SITE_OTHER): Payer: Medicare HMO | Admitting: Infectious Diseases

## 2021-01-19 ENCOUNTER — Other Ambulatory Visit: Payer: Self-pay | Admitting: Infectious Diseases

## 2021-01-19 ENCOUNTER — Encounter: Payer: Self-pay | Admitting: Infectious Diseases

## 2021-01-19 ENCOUNTER — Other Ambulatory Visit: Payer: Self-pay

## 2021-01-19 VITALS — BP 166/74 | HR 56 | Temp 97.9°F | Resp 16 | Ht 67.0 in | Wt 198.8 lb

## 2021-01-19 DIAGNOSIS — Z5181 Encounter for therapeutic drug level monitoring: Secondary | ICD-10-CM | POA: Diagnosis not present

## 2021-01-19 MED ORDER — VITAMIN B-6 50 MG PO TABS: 50.0000 mg | ORAL_TABLET | Freq: Every day | ORAL | 0 refills | Status: DC

## 2021-01-19 MED ORDER — ISONIAZID 300 MG PO TABS: 300.0000 mg | ORAL_TABLET | Freq: Every day | ORAL | 0 refills | Status: DC

## 2021-01-19 NOTE — Progress Notes (Signed)
[facility]                                                             [place], [place], [place], [address]                                                                  Phn. [phone]; Fax: [phone]                                                                             Date: 01/19/21  Reason for Follow up: Latent TB   Assessment Latent TB   Leukocytosis: seems to be chronic   Rheumatoid Arthritis Psoriatic arthritis  Follows with Rheumatology, Currently started on Orencia on 10/21 and also on prednisone 5mg  PO daily  Pulmonary Fibrosis/ILD Follows with Pulmonary   CAD and Ischemic Cadiomyopathy/CHF PAF  Follows with Cardiology   Plan Continue Isonidazid and Pyridoxine , plan for 6-9 months CBC and CMP  Fu in a month  Fu with Rheumatology as instructed   All questions and concerns were discussed and addressed. Patient verbalized understanding of the plan. ____________________________________________________________________________________________________________________ Subjective/Interval Events 01/19/21 01/19/21 Here for FU for latent TB tx. Started on [place] on 10/21. Taking Isoniazid and pyridoxine but has missed 3-4 pills  since last visit due to delay in getting meds from pharmacy. He is taking Isoniazid in empty stomach. Advised to pick up his meds few days before running out of meds. Denies any trouble with meds. Denies nausea, vomiting, abdominal pain or jaundice . Appetite is OK. He closely following with Rheumatology and was recently started on Orencia on 10/21. His joint pain has much improved after starting Orencia. Prednisone has been dropped from 10mg  PO daily to 5mg . Doing well and no complaints today.   ROS: 10 point ROS done with pertinent positives and negatives listed above   Past Medical History:  Diagnosis Date   Abnormal CT scan,  stomach    Chronic systolic dysfunction of left ventricle    Coronary artery disease    lateral STEMI 02/22/15 3/10 cutting balloon to ISR of the o/pLCX   GERD (gastroesophageal reflux disease)    Hyperlipidemia    Hypertension    Ischemic cardiomyopathy    LBBB (left bundle branch block)    Leukocytosis    followed by hematology, reactive   Lymphadenopathy    Myocardial infarction ([place]) 02/2015   Psoriasis 2003   Psoriatic arthritis ([place])    Pulmonary fibrosis ([place])    Rheumatoid arthritis(714.0) 2012   Typical atrial flutter ([place])    Past Surgical History:  Procedure Laterality Date   CARDIAC CATHETERIZATION N/A 02/22/2015   Procedure: Left Heart Cath and Coronary Angiography;  Surgeon: Lorretta Harp, MD;  Location: [facility]  INVASIVE CV LAB;  Service: Cardiovascular;  Laterality: N/A;   CARDIAC CATHETERIZATION N/A 02/22/2015   Procedure: Coronary Stent Intervention;  Surgeon: Lorretta Harp, MD;  Location: [facility] CV LAB;  Service: Cardiovascular;  Laterality: N/A;   CARDIOVERSION N/A 04/12/2015   Procedure: CARDIOVERSION;  Surgeon: Larey Dresser, MD;  Location: [facility] ENDOSCOPY;  Service: Cardiovascular;  Laterality: N/A;   COLONOSCOPY  07/01/2003   KWI:OXBDZH colonic mucosa except for the proximal right colon in the area of ICV which was not seen completely due to inadequate bowel prep. followed with ACBE which was normal.    COLONOSCOPY N/A 08/24/2015   Dr. Gala [place]: Normal colon. Next colonoscopy in 10 years.   CORONARY BALLOON ANGIOPLASTY N/A 05/12/2018   Procedure: CORONARY BALLOON ANGIOPLASTY;  Surgeon: Belva Crome, MD;  Location: [facility] CV LAB;  Service: Cardiovascular;  Laterality: N/A;   ELECTROPHYSIOLOGIC STUDY N/A 05/30/2015   Atrial fibrillation ablation by Dr Rayann Heman   ESOPHAGOGASTRODUODENOSCOPY N/A 08/24/2015   Dr. Gala [place]: Medium-sized hiatal hernia, erosive gastropathy. [place] lesions. Esophageal mucosa distally suggestive of short segment Barrett's esophagus. Not  confirmed on biopsy. Gastric biopsy with minimal chronic inflammation   GIVENS CAPSULE STUDY N/A 04/17/2016   Procedure: GIVENS CAPSULE STUDY;  Surgeon: Daneil Dolin, MD;  [facility];  Service: Endoscopy;  Laterality: N/A;  Pt to arrive at 8:00 am for 8:30 am appt   LEFT HEART CATH AND CORONARY ANGIOGRAPHY N/A 05/11/2018   Procedure: LEFT HEART CATH AND CORONARY ANGIOGRAPHY;  Surgeon: Belva Crome, MD;  Location: [facility] CV LAB;  Service: Cardiovascular;  Laterality: N/A;   TEE WITHOUT CARDIOVERSION N/A 04/12/2015   Procedure: TRANSESOPHAGEAL ECHOCARDIOGRAM (TEE);  Surgeon: Larey Dresser, MD;  Location: [facility] ENDOSCOPY;  Service: Cardiovascular;  Laterality: N/A;   Current Outpatient Medications on File Prior to Visit  Medication Sig Dispense Refill   Abatacept (ORENCIA CLICKJECT) 299 MG/ML SOAJ Inject 125 mg into the skin once a week. 12 mL 0   amLODipine (NORVASC) 5 MG tablet Take 1 tablet (5 mg total) by mouth daily. 30 tablet 6   apixaban (ELIQUIS) 5 MG TABS tablet TAKE 1 TABLET TWICE DAILY 180 tablet 1   atorvastatin (LIPITOR) 80 MG tablet TAKE 1 TABLET EVERY DAY AT 6 PM 90 tablet 3   Clobetasol Prop Emollient Base 0.05 % emollient cream APPLY TO AFFECTED AREA TWICE A DAY AS NEEDED 30 g 2   clopidogrel (PLAVIX) 75 MG tablet TAKE 1 TABLET (75 MG TOTAL) BY MOUTH DAILY. 90 tablet 3   furosemide (LASIX) 40 MG tablet TAKE 1 TABLET TWICE DAILY 180 tablet 3   isoniazid (NYDRAZID) 300 MG tablet Take 1 tablet (300 mg total) by mouth daily. 30 tablet 2   losartan (COZAAR) 100 MG tablet Take 1 tablet (100 mg total) by mouth daily. 90 tablet 0   pantoprazole (PROTONIX) 40 MG tablet TAKE 1 TABLET TWICE DAILY 180 tablet 1   potassium chloride SA (KLOR-CON) 20 MEQ tablet TAKE 2 TABLETS TWICE DAILY 360 tablet 3   predniSONE (DELTASONE) 5 MG tablet TAKE 1 TABLET EVERY DAY WITH BREAKFAST 90 tablet 0   pyridOXINE (VITAMIN B-6) 50 MG tablet Take 1 tablet (50 mg total) by mouth daily. 30 tablet  2   sotalol (BETAPACE) 120 MG tablet TAKE 1 TABLET EVERY 12 HOURS 180 tablet 3   No current facility-administered medications on file prior to visit.   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   Drug use: No    Comment: H/O marijuana use many years ago.   Sexual activity: Yes    Birth control/protection: None  Other Topics Concern   Not on file  Social History Narrative   Not on file   Social Determinants of Health   Financial Resource Strain: Not on file  Food Insecurity: Not on file  Transportation Needs: Not on file  Physical Activity: Not on file  Stress: Not on file  Social Connections: Not on file  Intimate Partner Violence: Not on file     Vitals BP (!) 166/74   Pulse (!) 56   Temp 97.9 F (36.6 C) (Oral)   Resp 16   Ht 5\' 7"  (1.702 m)   Wt 198 lb 12.8 oz (90.2 kg)   SpO2 98%   BMI 31.14 kg/m   Examination  General - not in acute distress, comfortably sitting in chair HEENT - no pallor and no icterus Chest - b/l clear air entry CVS- Normal s1s2, RRR Abdomen - Soft, Non tender , non distended Ext- no pedal edema Neuro: grossly normal Psych : calm and cooperative   Recent labs CBC Latest Ref Rng & Units 12/20/2020 11/08/2020 08/02/2020  WBC 4.0 - 10.5  K/uL 17.9(H) 16.7(H) 13.2(H)  Hemoglobin 13.0 - 17.0 g/dL 15.2 14.6 15.4  Hematocrit 39.0 - 52.0 % 48.7 45.8 48.7  Platelets 150 - 400 K/uL 246 233 216   CMP Latest Ref Rng & Units 12/20/2020 11/08/2020 09/12/2020  Glucose 70 - 99 mg/dL 105(H) 108(H) -  BUN 8 - 23 mg/dL 12 16 -  Creatinine 0.61 - 1.24 mg/dL 0.79 0.73 0.70  Sodium 135 - 145 mmol/L 138 138 -  Potassium 3.5 - 5.1 mmol/L 4.6 4.6 -  Chloride 98 - 111 mmol/L 103 103 -  CO2 22 - 32 mmol/L 30 28 -  Calcium 8.9 - 10.3 mg/dL 9.0 9.4 -  Total Protein 6.5 - 8.1 g/dL 7.2 7.2 -  Total Bilirubin 0.3 - 1.2 mg/dL 0.4 0.6 -  Alkaline Phos 38 - 126 U/L 70 68 -  AST 15 - 41 U/L 38 35 -  ALT 0 - 44 U/L 38 31 -     Pertinent Microbiology Results for orders placed or performed during the hospital encounter of 05/15/20  SARS CORONAVIRUS 2 (TAT 6-24 HRS) Nasopharyngeal Nasopharyngeal Swab     Status: None   Collection Time: 05/15/20  9:05 AM   Specimen: Nasopharyngeal Swab  Result Value Ref Range Status   SARS Coronavirus 2 NEGATIVE NEGATIVE Final    Comment: (NOTE) SARS-CoV-2 target nucleic acids are NOT DETECTED.  The SARS-CoV-2 RNA is  generally detectable in upper and lower respiratory specimens during the acute phase of infection. Negative results do not preclude SARS-CoV-2 infection, do not rule out co-infections with other pathogens, and should not be used as the sole basis for treatment or other patient management decisions. Negative results must be combined with clinical observations, patient history, and epidemiological information. The expected result is Negative.  Fact Sheet for Patients: SugarRoll.be  Fact Sheet for Healthcare Providers: https://www.woods-mathews.com/  This test is not yet approved or cleared by the [place] FDA and  has been authorized for detection and/or diagnosis of SARS-CoV-2 by FDA under an Emergency Use Authorization (EUA). This EUA will remain  in  effect (meaning this test can be used) for the duration of the COVID-19 declaration under Se ction 564(b)(1) of the Act, 21 U.S.C. section 360bbb-3(b)(1), unless the authorization is terminated or revoked sooner.  Performed at [facility], [place] [address], [place], [place] [address]       Pertinent Imaging Ct chest 12/21/19  FINDINGS: Cardiovascular: Heart size is enlarged with left ventricular and left atrial dilatation. There is no significant pericardial fluid, thickening or pericardial calcification. There is aortic atherosclerosis, as well as atherosclerosis of the great vessels of the mediastinum and the coronary arteries, including calcified atherosclerotic plaque in the left main, left anterior descending, left circumflex and right coronary arteries. Curvilinear hypoattenuation in the left ventricular myocardium within the distal LAD territory indicative of fibrofatty metaplasia from prior LAD territory myocardial infarction.   Mediastinum/Nodes: No pathologically enlarged mediastinal or hilar lymph nodes. Please note that accurate exclusion of hilar adenopathy is limited on noncontrast CT scans. Moderate-sized hiatal hernia. No axillary lymphadenopathy.   Lungs/Pleura: When compared to the prior high-resolution chest CT 05/12/2015, there are again widespread patchy areas of ground-glass attenuation, septal thickening, bronchiolectasis and honeycombing. Many of the affected areas, particularly in the lung bases, appear to spare the median subpleural interstitium. Scattered areas of mild cylindrical bronchiectasis and some peripheral bronchiolectasis are noted. Overall, there is minimal progression of disease compared to the prior study from 2017. Inspiratory and expiratory imaging is unremarkable. No acute consolidative airspace disease. No pleural effusions. No definite suspicious appearing pulmonary nodules or masses are noted.   Upper Abdomen: Aortic  atherosclerosis.   Musculoskeletal: There are no aggressive appearing lytic or blastic lesions noted in the visualized portions of the skeleton.   IMPRESSION: 1. The appearance of the lungs remains compatible with interstitial lung disease, technically characterized as probable usual interstitial pneumonia (UIP) per current ATS guidelines. 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 2. Aortic atherosclerosis, in addition to left main and 3 vessel coronary artery disease. Please note that although the presence of coronary artery calcium documents the presence of coronary artery disease, the severity of this disease and any potential stenosis cannot be assessed on this non-gated CT examination. Assessment for potential risk factor modification, dietary therapy or pharmacologic therapy may be warranted, if clinically indicated. 3. Cardiomegaly with left ventricular and left atrial dilatation. 4. Moderate-sized hiatal hernia.   Aortic Atherosclerosis (ICD10-I70.0).   All pertent labs/Imagings/notes reviewed. All pertinent plain films and CT images have been personally visualized and interpreted; radiology reports have been reviewed. Decision making incorporated into the Impression / Recommendations.  I have spent 30 minutes for this patient encounter including  review of prior medical records with greater than 50%  of time in face to face counsel of the patient/discussing diagnostics and plan of care.   Electronically signed by:  Rosiland Oz, MD Infectious Disease Physician [facility] [address] [place], [place] [address] Phone: [phone]  Fax: [phone]

## 2021-01-20 LAB — COMPREHENSIVE METABOLIC PANEL
AG Ratio: 1.2 (calc) (ref 1.0–2.5)
ALT: 29 U/L (ref 9–46)
AST: 31 U/L (ref 10–35)
Albumin: 3.6 g/dL (ref 3.6–5.1)
Alkaline phosphatase (APISO): 74 U/L (ref 35–144)
BUN: 11 mg/dL (ref 7–25)
CO2: 28 mmol/L (ref 20–32)
Calcium: 9.6 mg/dL (ref 8.6–10.3)
Chloride: 103 mmol/L (ref 98–110)
Creat: 0.8 mg/dL (ref 0.70–1.35)
Globulin: 3 g/dL (calc) (ref 1.9–3.7)
Glucose, Bld: 160 mg/dL — ABNORMAL HIGH (ref 65–99)
Potassium: 4.6 mmol/L (ref 3.5–5.3)
Sodium: 140 mmol/L (ref 135–146)
Total Bilirubin: 0.5 mg/dL (ref 0.2–1.2)
Total Protein: 6.6 g/dL (ref 6.1–8.1)

## 2021-01-20 LAB — CBC
HCT: 45 % (ref 38.5–50.0)
Hemoglobin: 14.7 g/dL (ref 13.2–17.1)
MCH: 27.8 pg (ref 27.0–33.0)
MCHC: 32.7 g/dL (ref 32.0–36.0)
MCV: 85.2 fL (ref 80.0–100.0)
MPV: 12.4 fL (ref 7.5–12.5)
Platelets: 249 10*3/uL (ref 140–400)
RBC: 5.28 10*6/uL (ref 4.20–5.80)
RDW: 12.6 % (ref 11.0–15.0)
WBC: 13.1 10*3/uL — ABNORMAL HIGH (ref 3.8–10.8)

## 2021-01-24 NOTE — Telephone Encounter (Signed)
ATC patient for Orencia PAP application status. Unable to reach - left VM requesting return call for update  Knox Saliva, PharmD, MPH, BCPS Clinical Pharmacist (Rheumatology and Pulmonology)

## 2021-02-06 ENCOUNTER — Other Ambulatory Visit: Payer: Self-pay | Admitting: Internal Medicine

## 2021-02-20 NOTE — Telephone Encounter (Signed)
Submitted Patient Assistance Application to [employer] for [facility] along with provider portion, PA, insurance card copy, and med list. Patient portion not included.  Fax# [phone] Phone# [phone]  Knox Saliva, PharmD, MPH, BCPS Clinical Pharmacist (Rheumatology and Pulmonology)

## 2021-02-27 ENCOUNTER — Other Ambulatory Visit: Payer: Self-pay | Admitting: *Deleted

## 2021-02-27 MED ORDER — ORENCIA CLICKJECT 125 MG/ML ~~LOC~~ SOAJ: 125.0000 mg | SUBCUTANEOUS | 0 refills | Status: DC

## 2021-02-27 NOTE — Telephone Encounter (Signed)
Refill request received via fax  Next Visit: 03/23/2021  Last Visit: 12/22/2020  Last Fill: 12/22/2020  [id number] arthritis involving multiple sites with positive rheumatoid factor   Current Dose per office note 12/22/2020: Orencia 125 mg sq weekly injections   Labs: 01/19/2021 Glucose 160, WBC 13.1  TB Gold: June 2022 due to positive TB Gold. Ongoing treatment under ID.   Okay to refill Orencia?

## 2021-03-09 ENCOUNTER — Ambulatory Visit (INDEPENDENT_AMBULATORY_CARE_PROVIDER_SITE_OTHER): Payer: Medicare HMO | Admitting: Infectious Diseases

## 2021-03-09 ENCOUNTER — Other Ambulatory Visit: Payer: Self-pay

## 2021-03-09 VITALS — BP 171/81 | HR 65 | Resp 16 | Ht 67.0 in | Wt 198.8 lb

## 2021-03-09 DIAGNOSIS — Z23 Encounter for immunization: Secondary | ICD-10-CM

## 2021-03-09 DIAGNOSIS — Z227 Latent tuberculosis: Secondary | ICD-10-CM | POA: Diagnosis not present

## 2021-03-09 DIAGNOSIS — Z7185 Encounter for immunization safety counseling: Secondary | ICD-10-CM | POA: Diagnosis not present

## 2021-03-09 DIAGNOSIS — Z5181 Encounter for therapeutic drug level monitoring: Secondary | ICD-10-CM

## 2021-03-09 MED ORDER — VITAMIN B-6 50 MG PO TABS: 50.0000 mg | ORAL_TABLET | Freq: Every day | ORAL | 0 refills | Status: DC

## 2021-03-09 MED ORDER — ISONIAZID 300 MG PO TABS: 300.0000 mg | ORAL_TABLET | Freq: Every day | ORAL | 0 refills | Status: DC

## 2021-03-09 NOTE — Progress Notes (Signed)
Office Visit Note  Patient: Curtis Flores             Date of Birth: 07-08-1955           MRN: [id number]             PCP: Chesley Noon, MD Referring: Chesley Noon, MD Visit Date: 03/23/2021 Occupation: @GUAROCC @  Subjective:  Medication management   History of Present Illness: Curtis Flores is a 66 y.o. male with a history of rheumatoid arthritis, psoriatic arthritis, psoriasis and pulmonary fibrosis.  He is currently taking treatment for latent tuberculosis.  He has been off Imuran for about 6 months now.  He has been experiencing increased pain and swelling in his hands.  He has been on Orencia injections which she is tolerating well.  Activities of Daily Living:  Patient reports morning stiffness for 2 hours.   Patient Reports nocturnal pain.  Difficulty dressing/grooming: Denies Difficulty climbing stairs: Denies Difficulty getting out of chair: Denies Difficulty using hands for taps, buttons, cutlery, and/or writing: Reports  Review of Systems  Constitutional:  Positive for fatigue.  HENT: Negative.  Negative for mouth dryness.   Eyes:  Positive for visual disturbance. Negative for dryness.       Cant drive now due to eyes  Respiratory:  Positive for shortness of breath.   Cardiovascular: Negative.  Negative for palpitations.  Gastrointestinal: Negative.  Negative for abdominal pain and blood in stool.  Endocrine: Negative.  Negative for heat intolerance.  Genitourinary: Negative.  Negative for urgency.  Musculoskeletal:  Positive for joint pain, joint pain and morning stiffness.  Skin: Negative.  Negative for color change, rash and sensitivity to sunlight.  Allergic/Immunologic: Negative.   Neurological: Negative.  Negative for headaches.  Hematological:  Positive for bruising/bleeding tendency.       On blood thinner  Psychiatric/Behavioral: Negative.  Negative for depressed mood and sleep disturbance. The patient is not nervous/anxious.    PMFS History:   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[place]) 08/09/2020   Atrial fibrillation with RVR ([place]) 05/28/2018   Chronic anticoagulation 05/28/2018   NSTEMI (non-ST elevated myocardial infarction) ([place]) 05/09/2018   Ischemic cardiomyopathy 12/04/2016   Leukocytosis 07/17/2016   Pain in joint involving multiple sites 04/25/2016   Anemia 04/16/2016   Coronary artery disease 04/16/2016   History of atrial fibrillation 04/16/2016   Psoriatic arthritis ([place]) 02/14/2016   High risk medication use 02/14/2016   NICM (nonischemic cardiomyopathy) ([place]) 08/29/2015   Essential hypertension 08/29/2015   Abnormal CT scan, stomach    Hiatal hernia    GERD (gastroesophageal reflux disease) 08/01/2015   Weight loss, unintentional 08/01/2015   Iron deficiency anemia 06/06/2015   Heme positive stool 06/06/2015   PAF (paroxysmal atrial fibrillation) ([place]) 05/30/2015   CAD S/P percutaneous coronary angioplasty 04/07/2015   Old lateral wall myocardial infarction 04/07/2015   Acute on chronic systolic congestive heart failure ([place]) 04/07/2015   LBBB (left bundle branch block) 04/07/2015   Wide-complex tachycardia 04/07/2015   Atrial flutter ([place]) 04/07/2015   Dyslipidemia, goal LDL below 70 03/07/2015   Psoriasis    Rheumatoid arthritis ([place])    Pulmonary fibrosis ([place]) 03/21/2011    Past Medical History:  Diagnosis Date   Abnormal CT scan, stomach    Chronic systolic dysfunction of left ventricle  Coronary artery disease    lateral STEMI 02/22/15 3/10 cutting balloon to ISR of the o/pLCX   GERD (gastroesophageal reflux disease)    Hyperlipidemia    Hypertension    Ischemic cardiomyopathy    LBBB (left bundle branch block)    Leukocytosis    followed by hematology, reactive   Lymphadenopathy    Myocardial infarction  ([place]) 02/2015   Psoriasis 2003   Psoriatic arthritis ([place])    Pulmonary fibrosis ([place])    Rheumatoid arthritis(714.0) 2012   Typical atrial flutter ([place])     Family History  Problem Relation Age of Onset   Hypertension Mother    Colon cancer Neg Hx    Past Surgical History:  Procedure Laterality Date   CARDIAC CATHETERIZATION N/A 02/22/2015   Procedure: Left Heart Cath and Coronary Angiography;  Surgeon: Lorretta Harp, MD;  Location: [place] [facility];  Service: Cardiovascular;  Laterality: N/A;   CARDIAC CATHETERIZATION N/A 02/22/2015   Procedure: Coronary Stent Intervention;  Surgeon: Lorretta Harp, MD;  Location: [place] [facility];  Service: Cardiovascular;  Laterality: N/A;   CARDIOVERSION N/A 04/12/2015   Procedure: CARDIOVERSION;  Surgeon: Larey Dresser, MD;  Location: [facility] ENDOSCOPY;  Service: Cardiovascular;  Laterality: N/A;   COLONOSCOPY  07/01/2003   IBB:CWUGQB colonic mucosa except for the proximal right colon in the area of ICV which was not seen completely due to inadequate bowel prep. followed with ACBE which was normal.    COLONOSCOPY N/A 08/24/2015   Dr. Gala Romney: Normal colon. Next colonoscopy in 10 years.   CORONARY BALLOON ANGIOPLASTY N/A 05/12/2018   Procedure: CORONARY BALLOON ANGIOPLASTY;  Surgeon: Belva Crome, MD;  Location: [place] [facility];  Service: Cardiovascular;  Laterality: N/A;   ELECTROPHYSIOLOGIC STUDY N/A 05/30/2015   Atrial fibrillation ablation by Dr Rayann Heman   ESOPHAGOGASTRODUODENOSCOPY N/A 08/24/2015   Dr. Gala Romney: Medium-sized hiatal hernia, erosive gastropathy. Cameron lesions. Esophageal mucosa distally suggestive of short segment Barrett's esophagus. Not confirmed on biopsy. Gastric biopsy with minimal chronic inflammation   GIVENS CAPSULE STUDY N/A 04/17/2016   Procedure: GIVENS CAPSULE STUDY;  Surgeon: Daneil Dolin, MD;  [facility];  Service: Endoscopy;  Laterality: N/A;  Pt to arrive at 8:00 am for 8:30 am appt   LEFT HEART  CATH AND CORONARY ANGIOGRAPHY N/A 05/11/2018   Procedure: LEFT HEART CATH AND CORONARY ANGIOGRAPHY;  Surgeon: Belva Crome, MD;  Location: [place] [facility];  Service: Cardiovascular;  Laterality: N/A;   TEE WITHOUT CARDIOVERSION N/A 04/12/2015   Procedure: TRANSESOPHAGEAL ECHOCARDIOGRAM (TEE);  Surgeon: Larey Dresser, MD;  Location: [facility] ENDOSCOPY;  Service: Cardiovascular;  Laterality: N/A;   Social History   Social History Narrative   Not on file   Immunization History  Administered Date(s) Administered   Fluad Quad(high Dose 65+) 12/09/2018, 11/01/2020   Influenza Split 12/22/2010   Influenza Whole 01/18/2010   Influenza, High Dose Seasonal PF 11/11/2017, 12/02/2018   Influenza, Seasonal, Injecte, Preservative Fre 12/03/2014, 12/13/2015, 12/03/2017   Influenza,inj,Quad PF,6+ Mos 11/29/2019   Influenza-Unspecified 12/03/2014, 12/13/2015, 11/12/2016   Moderna Sars-Covid-2 Vaccination 05/08/2019, 06/09/2019, 01/29/2020, 08/09/2020   PFIZER(Purple Top)SARS-COV-2 Vaccination 10/23/2020   Pneumococcal Conjugate-13 08/20/2016, 04/07/2018   Pneumococcal Polysaccharide-23 01/22/2011, 03/05/2011, 03/09/2021   Pneumococcal-Unspecified 01/22/2011, 03/05/2011     Objective: Vital Signs: BP (!) 162/77    Pulse 71    Ht 5\' 7"  (1.702 m)    Wt 200 lb (90.7 kg)    BMI 31.32 kg/m  Physical Exam Vitals and nursing note reviewed.  Constitutional:      Appearance: He is well-developed.  HENT:     Head: Normocephalic and atraumatic.  Eyes:     Conjunctiva/sclera: Conjunctivae normal.     Pupils: Pupils are equal, round, and reactive to light.  Cardiovascular:     Rate and Rhythm: Normal rate and regular rhythm.     Heart sounds: Normal heart sounds.  Pulmonary:     Effort: Pulmonary effort is normal.     Breath sounds: Normal breath sounds.  Abdominal:     General: Bowel sounds are normal.     Palpations: Abdomen is soft.  Musculoskeletal:     Cervical back: Normal range of motion and  neck supple.  Skin:    General: Skin is warm and dry.     Capillary Refill: Capillary refill takes less than 2 seconds.  Neurological:     Mental Status: He is alert and oriented to person, place, and time.  Psychiatric:        Behavior: Behavior normal.     Musculoskeletal Exam: C-spine was in good range of motion.  Shoulder joints, elbow joints and wrist joints were in good range of motion.  He had synovitis over bilateral MCP joints as displayed below.  Hip joints and knee joints with good range of motion without any warmth swelling or effusion.  There was no tenderness over ankles or MTPs.  CDAI Exam: CDAI Score: 12.9  Patient Global: 4 mm; Provider Global: 5 mm Swollen: 6 ; Tender: 6  Joint Exam 03/23/2021      Right  Left  MCP 2  Swollen Tender  Swollen Tender  MCP 3  Swollen Tender  Swollen Tender  MCP 4     Swollen Tender  MCP 5  Swollen Tender        Investigation: No additional findings.  Imaging: No results found.  Recent Labs: Lab Results  Component Value Date   WBC 15.3 (H) 03/20/2021   HGB 14.3 03/20/2021   PLT 249 03/20/2021   NA 139 03/20/2021   K 4.1 03/20/2021   CL 102 03/20/2021   CO2 28 03/20/2021   GLUCOSE 103 (H) 03/20/2021   BUN 12 03/20/2021   CREATININE 0.66 03/20/2021   BILITOT 0.5 03/20/2021   ALKPHOS 78 03/20/2021   AST 43 (H) 03/20/2021   ALT 35 03/20/2021   PROT 7.3 03/20/2021   ALBUMIN 3.4 (L) 03/20/2021   CALCIUM 9.2 03/20/2021   GFRAA 114 03/14/2020   QFTBGOLDPLUS Positive (A) 08/02/2020    Speciality Comments: No specialty comments available.  Procedures:  No procedures performed Allergies: Patient has no known allergies.   Assessment / Plan:     Visit Diagnoses: Rheumatoid arthritis involving multiple sites with positive rheumatoid factor (HCC) - Severe disease.  He continues to have pain and discomfort in his bilateral hands.  He was doing much better on the combination of Imuran and Orencia.  He has been off Imuran  and for the last 6 months once he started treatment for latent tuberculosis.  He had synovitis over MCPs today.  He will be finishing treatment for latent tuberculosis in February and will restart Imuran.  Psoriatic arthritis (HCC)-he had no DIP or PIP thickening and no synovitis today.  Psoriasis-no active psoriasis lesions were noted.  High risk medication use - Orencia 125 mg sq weekly injections (Initiated 10/11/2016).  gap from June 2022-October 2022.  He has been taking Orencia infusions injections on a regular  basis.  Imuran is on hold.  He will resume Humira and after he finishes treatment for latent tuberculosis.  Labs from March 20, 2021 showed elevated white cell count due to prednisone use.  LFTs were elevated most likely due to [place] therapy.  Information regarding immunization was placed in the AVS.  He was advised to hold Orencia in case he develops an infection and resume after the infection resolves.  Pulmonary fibrosis ([place]) - Followed by Dr. Elsworth Soho.  PFTs updated 05/16/2020.  Chest CT updated on 09/12/2020 which did not reveal any significant interval change and moderate pulmonary fibrosis.  Positive QuantiFERON-TB Gold test - Dr. Gale Journey who initiated INH/B6   Osteopenia of multiple sites - DEXA updated on 04/28/2020: FRAX not reported because of the T-scores for spine total, hip total, femoral neck at or above -1.0.  Use of calcium rich diet and vitamin D was discussed.  History of bronchiectasis-followed by pulmonologist.  History of congestive heart failure - Dr. [name]  History of gastroesophageal reflux (GERD)  History of hyperlipidemia  History of coronary artery disease-his risk of heart disease with autoimmune disease was discussed.  Dietary modifications and exercise was emphasized.  History of atrial fibrillation  History of hypertension  Orders: No orders of the defined types were placed in this encounter.  No orders of the defined types were placed in this  encounter.    Follow-Up Instructions: Return in about 2 months (around 05/21/2021) for Rheumatoid arthritis.   Bo Merino, MD  Note - This record has been created using Editor, commissioning.  Chart creation errors have been sought, but may not always  have been located. Such creation errors do not reflect on  the standard of medical care.

## 2021-03-09 NOTE — Progress Notes (Signed)
Patient Active Problem List   Diagnosis Date Noted   Medication monitoring encounter 12/01/2020   TB lung, 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   Atrial fibrillation with RVR ([place]) 05/28/2018   Chronic anticoagulation 05/28/2018   NSTEMI (non-ST elevated myocardial infarction) ([place]) 05/09/2018   Ischemic cardiomyopathy 12/04/2016   Leukocytosis 07/17/2016   Pain in joint involving multiple sites 04/25/2016   Psoriatic arthritis ([place]) 02/14/2016   High risk medication use 02/14/2016   NICM (nonischemic cardiomyopathy) ([place]) 08/29/2015   Essential hypertension 08/29/2015   Abnormal CT scan, stomach    Hiatal hernia    GERD (gastroesophageal reflux disease) 08/01/2015   Weight loss, unintentional 08/01/2015   Iron deficiency anemia 06/06/2015   Heme positive stool 06/06/2015   PAF (paroxysmal atrial fibrillation) ([place]) 05/30/2015   CAD S/P percutaneous coronary angioplasty 04/07/2015   Old lateral wall myocardial infarction 04/07/2015   Acute on chronic systolic congestive heart failure ([place]) 04/07/2015   LBBB (left bundle branch block) 04/07/2015   Wide-complex tachycardia 04/07/2015   Dyslipidemia, goal LDL below 70 03/07/2015   Psoriasis    Rheumatoid arthritis ([place])    Pulmonary fibrosis ([place]) 03/21/2011   Current Outpatient Medications on File Prior to Visit  Medication Sig Dispense Refill   furosemide (LASIX) 40 MG tablet Take by mouth.     Abatacept (ORENCIA CLICKJECT) 161 MG/ML SOAJ Inject 125 mg into the skin once a week. 12 mL 0   amLODipine (NORVASC) 5 MG tablet Take 1 tablet (5 mg total) by mouth daily. 30 tablet 6   apixaban (ELIQUIS) 5 MG TABS tablet TAKE 1 TABLET TWICE DAILY 180 tablet 1   atorvastatin (LIPITOR) 80 MG tablet TAKE 1 TABLET EVERY DAY AT 6 PM 90 tablet 3   Clobetasol Prop Emollient Base 0.05 % emollient cream APPLY TO AFFECTED AREA TWICE A DAY AS NEEDED 30 g 2   clopidogrel (PLAVIX) 75 MG tablet TAKE 1 TABLET (75 MG TOTAL) BY MOUTH DAILY. 90 tablet 3    losartan (COZAAR) 100 MG tablet TAKE 1 TABLET (100 MG TOTAL) BY MOUTH DAILY. 90 tablet 3   pantoprazole (PROTONIX) 40 MG tablet TAKE 1 TABLET TWICE DAILY 180 tablet 1   potassium chloride SA (KLOR-CON) 20 MEQ tablet TAKE 2 TABLETS TWICE DAILY 360 tablet 3   predniSONE (DELTASONE) 5 MG tablet TAKE 1 TABLET EVERY DAY WITH BREAKFAST 90 tablet 0   sotalol (BETAPACE) 120 MG tablet TAKE 1 TABLET EVERY 12 HOURS 180 tablet 3   No current facility-administered medications on file prior to visit.    Subjective: Here for Fu for latent TB. He was started on Isoniazid/Pyridoxine approx 8/20, approx a week after visit with Dr Gale Journey. Accompanied by his wife. Taking Isoniazid and Pyridoxine daily. Denies missed doses. Denies jaundice, nausea, vomiting, abdominal pain and diarrhea. He is following with Rheumatology for RA and Psoriatic arthritis. He is currently on Orencia injection every week and prednisone 5mg  PO daily. Arthritis is improving but feels stiffness in his joints. Last lab results 12/7 with unremarkable CMP. Will plan to do CMP today and fu in a month around 04/23/20 when he will have completed 6 months of tx. He is willing to get PCV 23. Advised to get Shingrix as well.   Review of Systems: ROS Negative for fevers and chills All other systems reviewed and were negative except as above   Past Medical History:  Diagnosis Date   Abnormal CT scan, stomach    Chronic systolic dysfunction  of left ventricle    Coronary artery disease    lateral STEMI 02/22/15 3/10 cutting balloon to ISR of the o/pLCX   GERD (gastroesophageal reflux disease)    Hyperlipidemia    Hypertension    Ischemic cardiomyopathy    LBBB (left bundle branch block)    Leukocytosis    followed by hematology, reactive   Lymphadenopathy    Myocardial infarction ([place]) 02/2015   Psoriasis 2003   Psoriatic arthritis ([place])    Pulmonary fibrosis ([place])    Rheumatoid arthritis(714.0) 2012   Typical atrial flutter [facility])    Past  Surgical History:  Procedure Laterality Date   CARDIAC CATHETERIZATION N/A 02/22/2015   Procedure: Left Heart Cath and Coronary Angiography;  Surgeon: Lorretta Harp, MD;  Location: [place] [facility];  Service: Cardiovascular;  Laterality: N/A;   CARDIAC CATHETERIZATION N/A 02/22/2015   Procedure: Coronary Stent Intervention;  Surgeon: Lorretta Harp, MD;  Location: [place] [facility];  Service: Cardiovascular;  Laterality: N/A;   CARDIOVERSION N/A 04/12/2015   Procedure: CARDIOVERSION;  Surgeon: Larey Dresser, MD;  Location: [facility] ENDOSCOPY;  Service: Cardiovascular;  Laterality: N/A;   COLONOSCOPY  07/01/2003   YOV:ZCHYIF colonic mucosa except for the proximal right colon in the area of ICV which was not seen completely due to inadequate bowel prep. followed with ACBE which was normal.    COLONOSCOPY N/A 08/24/2015   Dr. Gala Romney: Normal colon. Next colonoscopy in 10 years.   CORONARY BALLOON ANGIOPLASTY N/A 05/12/2018   Procedure: CORONARY BALLOON ANGIOPLASTY;  Surgeon: Belva Crome, MD;  Location: [place] [facility];  Service: Cardiovascular;  Laterality: N/A;   ELECTROPHYSIOLOGIC STUDY N/A 05/30/2015   Atrial fibrillation ablation by Dr Rayann Heman   ESOPHAGOGASTRODUODENOSCOPY N/A 08/24/2015   Dr. Gala Romney: Medium-sized hiatal hernia, erosive gastropathy. Cameron lesions. Esophageal mucosa distally suggestive of short segment Barrett's esophagus. Not confirmed on biopsy. Gastric biopsy with minimal chronic inflammation   GIVENS CAPSULE STUDY N/A 04/17/2016   Procedure: GIVENS CAPSULE STUDY;  Surgeon: Daneil Dolin, MD;  [facility];  Service: Endoscopy;  Laterality: N/A;  Pt to arrive at 8:00 am for 8:30 am appt   LEFT HEART CATH AND CORONARY ANGIOGRAPHY N/A 05/11/2018   Procedure: LEFT HEART CATH AND CORONARY ANGIOGRAPHY;  Surgeon: Belva Crome, MD;  Location: [place] [facility] LAB;  Service: Cardiovascular;  Laterality: N/A;   TEE WITHOUT CARDIOVERSION N/A 04/12/2015   Procedure:  TRANSESOPHAGEAL ECHOCARDIOGRAM (TEE);  Surgeon: Larey Dresser, MD;  Location: [place];  Service: Cardiovascular;  Laterality: N/A;    Social History   Tobacco Use   Smoking status: Former    Packs/day: 1.00    Years: 30.00    Pack years: 30.00    Types: Cigarettes    Quit date: 03/21/2003    Years since quitting: 17.9   Smokeless tobacco: Never  Vaping Use   Vaping Use: Never used  Substance Use Topics   Alcohol use: No    Alcohol/week: 0.0 standard drinks    Comment: H/O case of beer weekly x 20 years, quiting in 2000-ish.   Drug use: No    Comment: H/O marijuana use many years ago.    Family History  Problem Relation Age of Onset   Hypertension Mother    Colon cancer Neg Hx     No Known Allergies  Health Maintenance  Topic Date Due   TETANUS/TDAP  Never done   Zoster Vaccines- Shingrix (1 of 2) Never done  Pneumonia Vaccine 81+ Years old (34 - PPSV23 if available, else PCV20) 04/08/2019   COVID-19 Vaccine (6 - Booster for Moderna series) 12/18/2020   COLONOSCOPY (Pts 45-32yrs Insurance coverage will need to be confirmed)  08/23/2025   INFLUENZA VACCINE  Completed   Hepatitis C Screening  Completed   HPV VACCINES  Aged Out    Objective: BP (!) 171/81    Pulse 65    Resp 16    Ht 5\' 7"  (1.702 m)    Wt 198 lb 12.8 oz (90.2 kg)    SpO2 96%    BMI 31.14 kg/m    Physical Exam Constitutional:      Appearance: Normal appearance.  HENT:     Head: Normocephalic and atraumatic.      Mouth: Mucous membranes are moist.  Eyes:    Conjunctiva/sclera: Conjunctivae normal. No jaundice     Pupils: Pupils are equal, round, and reactive to light.   Cardiovascular:     Rate and Rhythm: Normal rate and regular rhythm.     Heart sounds: No murmur heard.   Pulmonary:     Effort: Pulmonary effort is normal.     Breath sounds: Normal breath sounds.   Abdominal:     General:     Palpations: Abdomen is soft. Non tender   Musculoskeletal:        General: Normal  range of motion.   Skin:    General: Skin is warm and dry.     Comments:  Neurological:     General: No focal deficit present.     Mental Status: awake, alert and oriented to person, place, and time.   Psychiatric:        Mood and Affect: Mood normal.   Lab Results Lab Results  Component Value Date   WBC 13.1 (H) 01/19/2021   HGB 14.7 01/19/2021   HCT 45.0 01/19/2021   MCV 85.2 01/19/2021   PLT 249 01/19/2021    Lab Results  Component Value Date   CREATININE 0.80 01/19/2021   BUN 11 01/19/2021   NA 140 01/19/2021   K 4.6 01/19/2021   CL 103 01/19/2021   CO2 28 01/19/2021    Lab Results  Component Value Date   ALT 29 01/19/2021   AST 31 01/19/2021   ALKPHOS 70 12/20/2020   BILITOT 0.5 01/19/2021    Lab Results  Component Value Date   CHOL 140 07/19/2020   HDL 49 07/19/2020   LDLCALC 76 07/19/2020   TRIG 73 07/19/2020   CHOLHDL 2.9 07/19/2020   No results found for: LABRPR, RPRTITER No results found for: HIV1RNAQUANT, HIV1RNAVL, CD4TABS   Problem List Items Addressed This Visit       Respiratory   TB lung, latent   Relevant Medications   azaTHIOprine (IMURAN) 50 MG tablet     Other   Medication monitoring encounter - Primary   Relevant Orders   Comprehensive metabolic panel   Immunization counseling   Other Visit Diagnoses     Need for pneumococcal vaccination       Relevant Orders   Pneumococcal polysaccharide vaccine 23-valent greater than or equal to 2yo subcutaneous/IM (Completed)      Assessment/Plan Latent TB Medication monitoring - Continue Isoniazid and Pyridoxine until 04/23/20 to complete 6 months of tx, med refilled  - CMP today  - Fu in 6 weeks, around end of treatment   RA/Psoriatic arthritis - On [place] weekly injections and prednisone 5mg  PO daily - Follows Rheumatology  Immunization Counseling PCV 23 today ( age greater than 110) Counseled for shingrix vaccine   Others Pulmonary Fibrosis/ILD Follows with Pulmonary     CAD and Ischemic Cadiomyopathy/CHF PAF  Follows with Cardiology   I have personally 40 minutes involved in face-to-face and non-face-to-face activities for this patient on the day of the visit. Professional time spent includes the following activities: Preparing to see the patient (review of tests), Obtaining and/or reviewing separately obtained history (admission/discharge record), Performing a medically appropriate examination and/or evaluation , Ordering medications/tests/procedures, referring and communicating with other health care professionals, Documenting clinical information in the EMR, Independently interpreting results (not separately reported), Communicating results to the patient/family/caregiver, Counseling and educating the patient/family/caregiver and Care coordination (not separately reported).   Wilber Oliphant, [facility] 03/09/2021, 1:14 PM

## 2021-03-10 LAB — COMPREHENSIVE METABOLIC PANEL
AG Ratio: 1.2 (calc) (ref 1.0–2.5)
ALT: 28 U/L (ref 9–46)
AST: 32 U/L (ref 10–35)
Albumin: 3.8 g/dL (ref 3.6–5.1)
Alkaline phosphatase (APISO): 81 U/L (ref 35–144)
BUN/Creatinine Ratio: 13 (calc) (ref 6–22)
BUN: 9 mg/dL (ref 7–25)
CO2: 32 mmol/L (ref 20–32)
Calcium: 9.6 mg/dL (ref 8.6–10.3)
Chloride: 100 mmol/L (ref 98–110)
Creat: 0.68 mg/dL — ABNORMAL LOW (ref 0.70–1.35)
Globulin: 3.1 g/dL (calc) (ref 1.9–3.7)
Glucose, Bld: 104 mg/dL — ABNORMAL HIGH (ref 65–99)
Potassium: 4.6 mmol/L (ref 3.5–5.3)
Sodium: 140 mmol/L (ref 135–146)
Total Bilirubin: 0.5 mg/dL (ref 0.2–1.2)
Total Protein: 6.9 g/dL (ref 6.1–8.1)

## 2021-03-19 ENCOUNTER — Telehealth: Payer: Self-pay | Admitting: Rheumatology

## 2021-03-19 DIAGNOSIS — M0579 Rheumatoid arthritis with rheumatoid factor of multiple sites without organ or systems involvement: Secondary | ICD-10-CM

## 2021-03-19 DIAGNOSIS — L409 Psoriasis, unspecified: Secondary | ICD-10-CM

## 2021-03-19 DIAGNOSIS — L4050 Arthropathic psoriasis, unspecified: Secondary | ICD-10-CM

## 2021-03-19 DIAGNOSIS — Z79899 Other long term (current) drug therapy: Secondary | ICD-10-CM

## 2021-03-19 NOTE — Telephone Encounter (Signed)
Received a fax from  [place] regarding an approval for [facility] patient assistance from 03/16/21 to 03/03/22.   Phone number: [phone]  Knox Saliva, PharmD, MPH, BCPS Clinical Pharmacist (Rheumatology and Pulmonology)

## 2021-03-19 NOTE — Telephone Encounter (Signed)
Patient's wife called the office requesting the patients lab orders be sent to [facility] in [place]. Patient's wife states patient is going tomorrow 1/17.

## 2021-03-19 NOTE — Telephone Encounter (Signed)
Lab Orders released.  

## 2021-03-20 ENCOUNTER — Other Ambulatory Visit (HOSPITAL_COMMUNITY)
Admission: RE | Admit: 2021-03-20 | Discharge: 2021-03-20 | Disposition: A | Discharge status: Home / Self Care | Payer: Medicare HMO | Source: Ambulatory Visit | Attending: Physician Assistant | Admitting: Physician Assistant

## 2021-03-20 ENCOUNTER — Other Ambulatory Visit: Payer: Self-pay

## 2021-03-20 ENCOUNTER — Inpatient Hospital Stay (HOSPITAL_COMMUNITY): Payer: Medicare HMO | Attending: Hematology

## 2021-03-20 DIAGNOSIS — M069 Rheumatoid arthritis, unspecified: Secondary | ICD-10-CM | POA: Insufficient documentation

## 2021-03-20 DIAGNOSIS — Z7952 Long term (current) use of systemic steroids: Secondary | ICD-10-CM | POA: Insufficient documentation

## 2021-03-20 DIAGNOSIS — Z7901 Long term (current) use of anticoagulants: Secondary | ICD-10-CM | POA: Insufficient documentation

## 2021-03-20 DIAGNOSIS — D72825 Bandemia: Secondary | ICD-10-CM

## 2021-03-20 DIAGNOSIS — L409 Psoriasis, unspecified: Secondary | ICD-10-CM | POA: Insufficient documentation

## 2021-03-20 DIAGNOSIS — I10 Essential (primary) hypertension: Secondary | ICD-10-CM | POA: Insufficient documentation

## 2021-03-20 DIAGNOSIS — Z8249 Family history of ischemic heart disease and other diseases of the circulatory system: Secondary | ICD-10-CM | POA: Insufficient documentation

## 2021-03-20 DIAGNOSIS — I2510 Atherosclerotic heart disease of native coronary artery without angina pectoris: Secondary | ICD-10-CM | POA: Diagnosis not present

## 2021-03-20 DIAGNOSIS — I4891 Unspecified atrial fibrillation: Secondary | ICD-10-CM | POA: Insufficient documentation

## 2021-03-20 DIAGNOSIS — J8410 Pulmonary fibrosis, unspecified: Secondary | ICD-10-CM | POA: Diagnosis not present

## 2021-03-20 DIAGNOSIS — I255 Ischemic cardiomyopathy: Secondary | ICD-10-CM | POA: Diagnosis not present

## 2021-03-20 DIAGNOSIS — L4050 Arthropathic psoriasis, unspecified: Secondary | ICD-10-CM | POA: Insufficient documentation

## 2021-03-20 DIAGNOSIS — Z87891 Personal history of nicotine dependence: Secondary | ICD-10-CM | POA: Diagnosis not present

## 2021-03-20 DIAGNOSIS — I252 Old myocardial infarction: Secondary | ICD-10-CM | POA: Insufficient documentation

## 2021-03-20 DIAGNOSIS — D72828 Other elevated white blood cell count: Secondary | ICD-10-CM | POA: Diagnosis not present

## 2021-03-20 DIAGNOSIS — Z79899 Other long term (current) drug therapy: Secondary | ICD-10-CM | POA: Diagnosis not present

## 2021-03-20 DIAGNOSIS — M0579 Rheumatoid arthritis with rheumatoid factor of multiple sites without organ or systems involvement: Secondary | ICD-10-CM | POA: Insufficient documentation

## 2021-03-20 DIAGNOSIS — E785 Hyperlipidemia, unspecified: Secondary | ICD-10-CM | POA: Insufficient documentation

## 2021-03-20 LAB — COMPREHENSIVE METABOLIC PANEL
ALT: 35 U/L (ref 0–44)
AST: 43 U/L — ABNORMAL HIGH (ref 15–41)
Albumin: 3.4 g/dL — ABNORMAL LOW (ref 3.5–5.0)
Alkaline Phosphatase: 78 U/L (ref 38–126)
Anion gap: 9 (ref 5–15)
BUN: 12 mg/dL (ref 8–23)
CO2: 28 mmol/L (ref 22–32)
Calcium: 9.2 mg/dL (ref 8.9–10.3)
Chloride: 102 mmol/L (ref 98–111)
Creatinine, Ser: 0.66 mg/dL (ref 0.61–1.24)
GFR, Estimated: >60 mL/min (ref >60)
Glucose, Bld: 103 mg/dL — ABNORMAL HIGH (ref 70–99)
Potassium: 4.1 mmol/L (ref 3.5–5.1)
Sodium: 139 mmol/L (ref 135–145)
Total Bilirubin: 0.5 mg/dL (ref 0.3–1.2)
Total Protein: 7.3 g/dL (ref 6.5–8.1)

## 2021-03-20 LAB — CBC WITH DIFFERENTIAL/PLATELET
Abs Immature Granulocytes: 0.13 10*3/uL — ABNORMAL HIGH (ref 0.00–0.07)
Basophils Absolute: 0.1 10*3/uL (ref 0.0–0.1)
Basophils Relative: 1 %
Eosinophils Absolute: 0.1 10*3/uL (ref 0.0–0.5)
Eosinophils Relative: 1 %
HCT: 44.7 % (ref 39.0–52.0)
Hemoglobin: 14.3 g/dL (ref 13.0–17.0)
Immature Granulocytes: 1 %
Lymphocytes Relative: 20 %
Lymphs Abs: 3.1 10*3/uL (ref 0.7–4.0)
MCH: 28 pg (ref 26.0–34.0)
MCHC: 32 g/dL (ref 30.0–36.0)
MCV: 87.6 fL (ref 80.0–100.0)
Monocytes Absolute: 1.1 10*3/uL — ABNORMAL HIGH (ref 0.1–1.0)
Monocytes Relative: 7 %
Neutro Abs: 10.8 10*3/uL — ABNORMAL HIGH (ref 1.7–7.7)
Neutrophils Relative %: 70 %
Platelets: 249 10*3/uL (ref 150–400)
RBC: 5.1 MIL/uL (ref 4.22–5.81)
RDW: 13.3 % (ref 11.5–15.5)
WBC: 15.3 10*3/uL — ABNORMAL HIGH (ref 4.0–10.5)
nRBC: 0 % (ref 0.0–0.2)

## 2021-03-20 LAB — LACTATE DEHYDROGENASE: LDH: 170 U/L (ref 98–192)

## 2021-03-21 ENCOUNTER — Ambulatory Visit (INDEPENDENT_AMBULATORY_CARE_PROVIDER_SITE_OTHER): Payer: Medicare HMO | Admitting: Pulmonary Disease

## 2021-03-21 ENCOUNTER — Encounter: Payer: Self-pay | Admitting: Pulmonary Disease

## 2021-03-21 ENCOUNTER — Other Ambulatory Visit: Payer: Self-pay

## 2021-03-21 VITALS — BP 128/78 | HR 74 | Temp 98.4°F | Ht 67.0 in | Wt 200.1 lb

## 2021-03-21 DIAGNOSIS — J849 Interstitial pulmonary disease, unspecified: Secondary | ICD-10-CM

## 2021-03-21 DIAGNOSIS — J8410 Pulmonary fibrosis, unspecified: Secondary | ICD-10-CM

## 2021-03-21 DIAGNOSIS — Z227 Latent tuberculosis: Secondary | ICD-10-CM

## 2021-03-21 NOTE — Patient Instructions (Signed)
°  X HRCT chest in July 2023  X Letter for jury duty

## 2021-03-21 NOTE — Assessment & Plan Note (Signed)
Appears unchanged on imaging. This is related to rheumatoid arthritis. We will reimage with HRCT in June which would be a 1 year follow-up. We will defer PFTs this year and only do this if his symptoms worsen

## 2021-03-21 NOTE — Assessment & Plan Note (Signed)
He completed 6 months of INH and [place] in February 20 and will stop after

## 2021-03-21 NOTE — Addendum Note (Signed)
Addended by: Fritzi Mandes D on: 03/21/2021 03:15 PM   Modules accepted: Orders

## 2021-03-21 NOTE — Progress Notes (Signed)
° °  Subjective:    Patient ID: Curtis Flores, male    DOB: 08-04-1955, 66 y.o.   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 He had a positive QuantiFERON test 08/2020.  Seen by ID, chest x-ray showed chronic ILD which I independently reviewed. CT chest was also obtained which showed no significant change in fibrosis and no evidence of active infection. Orencia and Imuran was stopped , he is now on INH and pyridoxine and complete 6 months of treatment in February. He is back on Orencia and prednisone.  Feels like arthritis is slightly worse in his hands. Breathing is unchanged, no cough or fevers or weight loss   Significant tests/ events reviewed  2012 -  presented with a symmetric polyarthritis of elbows, wrists, hands, neck, shoulders & feet   CXR at [facility] showed bibasal interstitial fibrosis with hilar lymphadenopathy  With POS  RA 89 &  CCP 122, ANA neg, HLA B 27 neg, CK 87   PPD neg, hep panel neg     CT chest  w con 09/2020 no sig change in fibrosis HRCT 12/2019 >> favor fibrotic NSIP vs probable UIP, honeycombing +, minimal progression from 2017, mod hiatal hernia   HRCT 05/2015-  showed ILD/NSIP , slight progression compared from 2013.      PFTs 2013 showed intraprenchymal restricton with FVC around 71% & DLCO 56% c/w ILD, no airway obstruction.   Rpt PFTs 10/2012 show mild drop in FVC to 66%, DLCO preserved     PFT 05/2015 FEV1 70%, ratio 82, FVC 66%, no BD response, DLCO 46%. DLCO has decreased from 2014.    PFT 12/2015 >> FVC 71%, DLCO 45% unc   PFTs 03/2018 show drop in FVC to 53%, TLC to 49%, DLCO maintained at 41%   PFTs 06/2019 moderate restriction, ratio 83, FVC 53%, DLCO  stable at 11.8/28%   PFTs 05/2020 stabl, FVC 1.93/57%, TLC 55%, DLCO 11.50 /48 %   Review of Systems neg for any significant sore throat, dysphagia, itching, sneezing, nasal congestion or excess/ purulent secretions, fever, chills, sweats, unintended wt loss, pleuritic or exertional cp, hempoptysis, orthopnea pnd or change in chronic leg swelling. Also denies presyncope, palpitations, heartburn, abdominal pain, nausea, vomiting, diarrhea or change in bowel or urinary habits, dysuria,hematuria, rash, arthralgias, visual complaints, headache, numbness weakness or ataxia.     Objective:   Physical Exam  Gen. Pleasant, well-nourished, in no distress ENT - no thrush, no pallor/icterus,no post nasal drip Neck: No JVD, no thyromegaly, no carotid bruits Lungs: no use of accessory muscles, no dullness to percussion, bibasal rales without rales or rhonchi  Cardiovascular: Rhythm regular, heart sounds  normal, no murmurs or gallops, no peripheral edema Musculoskeletal: No deformities, no cyanosis or clubbing        Assessment & Plan:

## 2021-03-23 ENCOUNTER — Other Ambulatory Visit: Payer: Self-pay

## 2021-03-23 ENCOUNTER — Ambulatory Visit (INDEPENDENT_AMBULATORY_CARE_PROVIDER_SITE_OTHER): Payer: Medicare HMO | Admitting: Rheumatology

## 2021-03-23 ENCOUNTER — Encounter: Payer: Self-pay | Admitting: Rheumatology

## 2021-03-23 VITALS — BP 162/77 | HR 71 | Ht 67.0 in | Wt 200.0 lb

## 2021-03-23 DIAGNOSIS — Z79899 Other long term (current) drug therapy: Secondary | ICD-10-CM | POA: Diagnosis not present

## 2021-03-23 DIAGNOSIS — H269 Unspecified cataract: Secondary | ICD-10-CM | POA: Insufficient documentation

## 2021-03-23 DIAGNOSIS — Z8719 Personal history of other diseases of the digestive system: Secondary | ICD-10-CM

## 2021-03-23 DIAGNOSIS — Z8709 Personal history of other diseases of the respiratory system: Secondary | ICD-10-CM

## 2021-03-23 DIAGNOSIS — M0579 Rheumatoid arthritis with rheumatoid factor of multiple sites without organ or systems involvement: Secondary | ICD-10-CM

## 2021-03-23 DIAGNOSIS — M8589 Other specified disorders of bone density and structure, multiple sites: Secondary | ICD-10-CM

## 2021-03-23 DIAGNOSIS — L409 Psoriasis, unspecified: Secondary | ICD-10-CM | POA: Diagnosis not present

## 2021-03-23 DIAGNOSIS — L4050 Arthropathic psoriasis, unspecified: Secondary | ICD-10-CM | POA: Diagnosis not present

## 2021-03-23 DIAGNOSIS — Z8639 Personal history of other endocrine, nutritional and metabolic disease: Secondary | ICD-10-CM

## 2021-03-23 DIAGNOSIS — R7612 Nonspecific reaction to cell mediated immunity measurement of gamma interferon antigen response without active tuberculosis: Secondary | ICD-10-CM

## 2021-03-23 DIAGNOSIS — Z8679 Personal history of other diseases of the circulatory system: Secondary | ICD-10-CM

## 2021-03-23 DIAGNOSIS — J8410 Pulmonary fibrosis, unspecified: Secondary | ICD-10-CM

## 2021-03-23 NOTE — Patient Instructions (Signed)
Standing Labs We placed an order today for your standing lab work.   Please have your standing labs drawn in April and every 3 months  If possible, please have your labs drawn 2 weeks prior to your appointment so that the provider can discuss your results at your appointment.  Please note that you may see your imaging and lab results in [place] before we have reviewed them. We may be awaiting multiple results to interpret others before contacting you. Please allow our office up to 72 hours to thoroughly review all of the results before contacting the office for clarification of your results.  We have open lab daily: Monday through Thursday from 1:30-4:30 PM and Friday from 1:30-4:00 PM at the office of Dr. Bo Merino, [facility].   Please be advised, all patients with office appointments requiring lab work will take precedent over walk-in lab work.  If possible, please come for your lab work on Monday and Friday afternoons, as you may experience shorter wait times. The office is located at [address], [place], [place], [place] [address] No appointment is necessary.   Labs are drawn by [facility]. Please bring your co-pay at the time of your lab draw.  You may receive a bill from [facility] for your lab work.  Please note if you are on Hydroxychloroquine and and an order has been placed for a Hydroxychloroquine level, you will need to have it drawn 4 hours or more after your last dose.  If you wish to have your labs drawn at another location, please call the office 24 hours in advance to send orders.  If you have any questions regarding directions or hours of operation,  please call [phone].   As a reminder, please drink plenty of water prior to coming for your lab work. Thanks!   Vaccines You are taking a medication(s) that can suppress your immune system.  The following immunizations are recommended: Flu annually Covid-19  Td/Tdap (tetanus, diphtheria, pertussis)  every 10 years Pneumonia (Prevnar 15 then Pneumovax 23 at least 1 year apart.  Alternatively, can take Prevnar 20 without needing additional dose) Shingrix: 2 doses from 4 weeks to 6 months apart  Please check with your PCP to make sure you are up to date.   If you have signs or symptoms of an infection or start antibiotics: First, call your PCP for workup of your infection. Hold your medication through the infection, until you complete your antibiotics, and until symptoms resolve if you take the following: Injectable medication (Actemra, Benlysta, Cimzia, Cosentyx, Enbrel, Humira, Kevzara, Orencia, Remicade, Simponi, Stelara, Taltz, Tremfya) Methotrexate Leflunomide (Arava) Mycophenolate (Cellcept) Morrie Sheldon, Olumiant, or Rinvoq  Heart Disease Prevention   Your inflammatory disease increases your risk of heart disease which includes heart attack, stroke, atrial fibrillation (irregular heartbeats), high blood pressure, heart failure and atherosclerosis (plaque in the arteries).  It is important to reduce your risk by:   Keep blood pressure, cholesterol, and blood sugar at healthy levels   Smoking Cessation   Maintain a healthy weight  BMI 20-25   Eat a healthy diet  Plenty of fresh fruit, vegetables, and whole grains  Limit saturated fats, foods high in sodium, and added sugars  DASH and Mediterranean diet   Increase physical activity  Recommend moderate physically activity for 150 minutes per week/ 30 minutes a day for five days a week These can be broken up into three separate ten-minute sessions during the day.   Reduce Stress  Meditation, slow  breathing exercises, yoga, coloring books  Dental visits twice a year

## 2021-03-27 ENCOUNTER — Other Ambulatory Visit (HOSPITAL_COMMUNITY): Payer: Self-pay | Admitting: *Deleted

## 2021-03-27 ENCOUNTER — Inpatient Hospital Stay (HOSPITAL_BASED_OUTPATIENT_CLINIC_OR_DEPARTMENT_OTHER): Payer: Medicare HMO | Admitting: Hematology

## 2021-03-27 ENCOUNTER — Other Ambulatory Visit: Payer: Self-pay

## 2021-03-27 VITALS — BP 179/70 | HR 50 | Temp 98.4°F | Resp 20 | Ht 67.32 in | Wt 198.2 lb

## 2021-03-27 DIAGNOSIS — D72829 Elevated white blood cell count, unspecified: Secondary | ICD-10-CM

## 2021-03-27 DIAGNOSIS — D72825 Bandemia: Secondary | ICD-10-CM | POA: Diagnosis not present

## 2021-03-27 DIAGNOSIS — D72828 Other elevated white blood cell count: Secondary | ICD-10-CM | POA: Diagnosis not present

## 2021-03-27 NOTE — Patient Instructions (Signed)
[place] at [facility] Discharge Instructions   You were seen and examined today by Dr. Delton Coombes. Your exam and lab work are normal/stable. Return as scheduled in 1 year.    Thank you for choosing [place] at [facility] to provide your oncology and hematology care.  To afford each patient quality time with our provider, please arrive at least 15 minutes before your scheduled appointment time.   If you have a lab appointment with the [facility] please come in thru the Main Entrance and check in at the main information desk.  You need to re-schedule your appointment should you arrive 10 or more minutes late.  We strive to give you quality time with our providers, and arriving late affects you and other patients whose appointments are after yours.  Also, if you no show three or more times for appointments you may be dismissed from the clinic at the providers discretion.     Again, thank you for choosing [facility].  Our hope is that these requests will decrease the amount of time that you wait before being seen by our physicians.       _____________________________________________________________  Should you have questions after your visit to [facility], please contact our office at [phone] and follow the prompts.  Our office hours are 8:00 a.m. and 4:30 p.m. Monday - Friday.  Please note that voicemails left after 4:00 p.m. may not be returned until the following business day.  We are closed weekends and major holidays.  You do have access to a nurse 24-7, just call the main number to the clinic [phone] and do not press any options, hold on the line and a nurse will answer the phone.    For prescription refill requests, have your pharmacy contact our office and allow 72 hours.    Due to Covid, you will need to wear a mask upon entering the hospital. If you do not have a mask, a mask will be given to  you at the Main Entrance upon arrival. For doctor visits, patients may have 1 support person age 64 or older with them. For treatment visits, patients can not have anyone with them due to social distancing guidelines and our immunocompromised population.

## 2021-03-27 NOTE — Progress Notes (Signed)
Curtis Flores, Curtis Flores [id number]   CLINIC:  Medical Oncology/Hematology  PCP:  Curtis Noon, MD Curtis [place] [id number]  [phone]  REASON FOR VISIT:  Follow-up for leukocytosis  PRIOR THERAPY: none  CURRENT THERAPY: surveillance  INTERVAL HISTORY:  Curtis Flores, a 65 y.o. male, returns for routine follow-up for his leukocytosis. Curtis Flores was last seen on 03/29/2020.  Today he reports feeling good. He denies fevers, night sweats, and weight loss.   REVIEW OF SYSTEMS:  Review of Systems  Constitutional:  Negative for appetite change, fatigue, fever and unexpected weight change.  Endocrine: Negative for hot flashes.  All other systems reviewed and are negative.  PAST MEDICAL/SURGICAL HISTORY:  Past Medical History:  Diagnosis Date   Abnormal CT scan, stomach    Chronic systolic dysfunction of left ventricle    Coronary artery disease    lateral STEMI 02/22/15 3/10 cutting balloon to ISR of the o/pLCX   GERD (gastroesophageal reflux disease)    Hyperlipidemia    Hypertension    Ischemic cardiomyopathy    LBBB (left bundle branch block)    Leukocytosis    followed by hematology, reactive   Lymphadenopathy    Myocardial infarction ([place]) 02/2015   Psoriasis 2003   Psoriatic arthritis ([place])    Pulmonary fibrosis ([place])    Rheumatoid arthritis(714.0) 2012   Typical atrial flutter ([place])    Past Surgical History:  Procedure Laterality Date   CARDIAC CATHETERIZATION N/A 02/22/2015   Procedure: Left Heart Cath and Coronary Angiography;  Surgeon: Lorretta Harp, MD;  Location: [facility] CV LAB;  Service: Cardiovascular;  Laterality: N/A;   CARDIAC CATHETERIZATION N/A 02/22/2015   Procedure: Coronary Stent Intervention;  Surgeon: Lorretta Harp, MD;  Location: [facility] [place] [facility] LAB;  Service: Cardiovascular;  Laterality: N/A;   CARDIOVERSION N/A 04/12/2015   Procedure: CARDIOVERSION;  Surgeon: Larey Dresser, MD;  Location: [facility] ENDOSCOPY;  Service: Cardiovascular;  Laterality: N/A;   COLONOSCOPY  07/01/2003   MAU:[id number] colonic mucosa except for the proximal right colon in the area of ICV which was not seen completely due to inadequate bowel prep. followed with ACBE which was normal.    COLONOSCOPY N/A 08/24/2015   Dr. Gala Romney: Normal colon. Next colonoscopy in 10 years.   CORONARY BALLOON ANGIOPLASTY N/A 05/12/2018   Procedure: CORONARY BALLOON ANGIOPLASTY;  Surgeon: Belva Crome, MD;  Location: [place] CV LAB;  Service: Cardiovascular;  Laterality: N/A;   ELECTROPHYSIOLOGIC STUDY N/A 05/30/2015   Atrial fibrillation ablation by Dr Rayann Heman   ESOPHAGOGASTRODUODENOSCOPY N/A 08/24/2015   Dr. Gala Romney: Medium-sized hiatal hernia, erosive gastropathy. Cameron lesions. Esophageal mucosa distally suggestive of short segment Barrett's esophagus. Not confirmed on biopsy. Gastric biopsy with minimal chronic inflammation   GIVENS CAPSULE STUDY N/A 04/17/2016   Procedure: GIVENS CAPSULE STUDY;  Surgeon: Daneil Dolin, MD;  [facility];  Service: Endoscopy;  Laterality: N/A;  Pt to arrive at 8:00 am for 8:30 am appt   LEFT HEART CATH AND CORONARY ANGIOGRAPHY N/A 05/11/2018   Procedure: LEFT HEART CATH AND CORONARY ANGIOGRAPHY;  Surgeon: Belva Crome, MD;  Location: [facility] LAB;  Service: Cardiovascular;  Laterality: N/A;   TEE WITHOUT CARDIOVERSION N/A 04/12/2015   Procedure: TRANSESOPHAGEAL ECHOCARDIOGRAM (TEE);  Surgeon: Larey Dresser, MD;  Location: [place];  Service: Cardiovascular;  Laterality: N/A;    SOCIAL HISTORY:  Social History   Socioeconomic History  Marital status: Married    Spouse name: Not on file   Number of children: 2   Years of education: Not on file   Highest education level: Not on file  Occupational History   Occupation: unemployed    Comment: not working do to arthritis; 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   Drug use: No    Comment: H/O marijuana use many years ago.   Sexual activity: Yes    Birth control/protection: None  Other Topics Concern   Not on file  Social History Narrative   Not on file   Social Determinants of Health   Financial Resource Strain: Not on file  Food Insecurity: Not on file  Transportation Needs: Not on file  Physical Activity: Not on file  Stress: Not on file  Social Connections: Not on file  Intimate Partner Violence: Not on file    FAMILY HISTORY:  Family History  Problem Relation Age of Onset   Hypertension Mother    Colon cancer Neg Hx     CURRENT MEDICATIONS:  Current Outpatient Medications  Medication Sig Dispense Refill   Abatacept (ORENCIA CLICKJECT) 160 MG/ML SOAJ Inject 125 mg into the skin once a week. 12 mL 0   amLODipine (NORVASC) 5 MG tablet Take 1 tablet (5 mg total) by mouth daily. 30 tablet 6   apixaban (ELIQUIS) 5 MG TABS tablet TAKE 1 TABLET TWICE DAILY 180 tablet 1   atorvastatin (LIPITOR) 80 MG tablet TAKE 1 TABLET EVERY DAY AT 6 PM 90 tablet 3   clopidogrel (PLAVIX) 75 MG tablet TAKE 1 TABLET (75 MG TOTAL) BY MOUTH DAILY. 90 tablet 3   furosemide (LASIX) 40 MG tablet Take by mouth.     isoniazid (NYDRAZID) 300 MG tablet Take 1 tablet (300 mg total) by mouth daily. 50 tablet 0   losartan (COZAAR) 100 MG tablet TAKE 1 TABLET (100 MG TOTAL) BY MOUTH DAILY. 90 tablet 3   pantoprazole (PROTONIX) 40 MG tablet TAKE 1 TABLET TWICE DAILY 180 tablet 1   potassium chloride SA (KLOR-CON) 20 MEQ tablet TAKE 2 TABLETS TWICE DAILY 360 tablet 3   predniSONE (DELTASONE) 5 MG  tablet TAKE 1 TABLET EVERY DAY WITH BREAKFAST 90 tablet 0   pyridOXINE (VITAMIN B-6) 50 MG tablet Take 1 tablet (50 mg total) by mouth daily. 50 tablet 0   sotalol (BETAPACE) 120 MG tablet TAKE 1 TABLET EVERY 12 HOURS 180 tablet 3   Clobetasol Prop Emollient Base 0.05 % emollient cream APPLY TO AFFECTED AREA TWICE A DAY AS NEEDED (Patient not taking: Reported on 03/27/2021) 30 g 2   No current facility-administered medications for this visit.    ALLERGIES:  No Known Allergies  PHYSICAL EXAM:  Performance status (ECOG): 1 - Symptomatic but completely ambulatory  Vitals:   03/27/21 1128  BP: (!) 179/70  Pulse: (!) 50  Resp: 20  Temp: 98.4 F (36.9 C)  SpO2: 98%   Wt Readings from Last  3 Encounters:  03/27/21 198 lb 3.1 oz (89.9 kg)  03/23/21 200 lb (90.7 kg)  03/21/21 200 lb 1.3 oz (90.8 kg)   Physical Exam Vitals reviewed.  Constitutional:      Appearance: Normal appearance.  Cardiovascular:     Rate and Rhythm: Normal rate and regular rhythm.     Pulses: Normal pulses.     Heart sounds: Normal heart sounds.  Pulmonary:     Effort: Pulmonary effort is normal.     Breath sounds: Normal breath sounds.  Abdominal:     Palpations: Abdomen is soft. There is no hepatomegaly, splenomegaly or mass.     Tenderness: There is no abdominal tenderness.  Musculoskeletal:     Right lower leg: No edema.     Left lower leg: No edema.  Lymphadenopathy:     Cervical: No cervical adenopathy.     Right cervical: No superficial cervical adenopathy.    Left cervical: No superficial cervical adenopathy.     Upper Body:     Right upper body: No supraclavicular adenopathy.     Left upper body: No supraclavicular adenopathy.     Lower Body: No right inguinal adenopathy. No left inguinal adenopathy.  Neurological:     General: No focal deficit present.     Mental Status: He is alert and oriented to person, place, and time.  Psychiatric:        Mood and Affect: Mood normal.         Behavior: Behavior normal.    LABORATORY DATA:  I have reviewed the labs as listed.  CBC Latest Ref Rng & Units 03/20/2021 01/19/2021 12/20/2020  WBC 4.0 - 10.5 K/uL 15.3(H) 13.1(H) 17.9(H)  Hemoglobin 13.0 - 17.0 g/dL 14.3 14.7 15.2  Hematocrit 39.0 - 52.0 % 44.7 45.0 48.7  Platelets 150 - 400 K/uL 249 249 246   CMP Latest Ref Rng & Units 03/20/2021 03/09/2021 01/19/2021  Glucose 70 - 99 mg/dL 103(H) 104(H) 160(H)  BUN 8 - 23 mg/dL '12 9 11  ' Creatinine 0.61 - 1.24 mg/dL 0.66 0.68(L) 0.80  Sodium 135 - 145 mmol/L 139 140 140  Potassium 3.5 - 5.1 mmol/L 4.1 4.6 4.6  Chloride 98 - 111 mmol/L 102 100 103  CO2 22 - 32 mmol/L 28 32 28  Calcium 8.9 - 10.3 mg/dL 9.2 9.6 9.6  Total Protein 6.5 - 8.1 g/dL 7.3 6.9 6.6  Total Bilirubin 0.3 - 1.2 mg/dL 0.5 0.5 0.5  Alkaline Phos 38 - 126 U/L 78 - -  AST 15 - 41 U/L 43(H) 32 31  ALT 0 - 44 U/L 35 28 29      Component Value Date/Time   RBC 5.10 03/20/2021 1111   MCV 87.6 03/20/2021 1111   MCV 82.2 08/12/2011 1256   MCH 28.0 03/20/2021 1111   MCHC 32.0 03/20/2021 1111   RDW 13.3 03/20/2021 1111   RDW 14.7 (H) 08/12/2011 1256   LYMPHSABS 3.1 03/20/2021 1111   LYMPHSABS 5.5 (H) 08/12/2011 1256   MONOABS 1.1 (H) 03/20/2021 1111   MONOABS 1.6 (H) 08/12/2011 1256   EOSABS 0.1 03/20/2021 1111   EOSABS 0.2 08/12/2011 1256   BASOSABS 0.1 03/20/2021 1111   BASOSABS 0.1 08/12/2011 1256    DIAGNOSTIC IMAGING:  I have independently reviewed the scans and discussed with the patient. No results found.   ASSESSMENT:  1. Neutrophilic leukocytosis: -He was followed in our clinic for leukocytosis since 2013. -Bone marrow biopsy on 04/10/2011 showed normocellular marrow with trilineage hematopoiesis. Flow cytometry  was negative. Lymphocytes with no aberrant phenotype. JAK2 V617F and BCR/ABL was negative. -He is on chronic prednisone which could be contributing to his leukocytosis.     2. Rheumatoid arthritis/psoriatic arthritis: -He is currently  taking prednisone 5 mg daily. He is also taking Orencia injection weekly.   PLAN:  1. Neutrophilic leukocytosis: - He does not have any B symptoms in the last 1 year.  No infections reported. - Reviewed labs from 03/20/2021.  White count is stable around 15.3 with predominantly neutrophils and some monocytes.  Rest of the CBC was normal. - Physical examination does not show any palpable adenopathy or splenomegaly. - RTC 1 year for follow-up with repeat labs.   2. Rheumatoid arthritis/psoriatic arthritis: - He is continuing prednisone 5 mg daily and Orencia.  Imuran has been on hold for 6 months.  Orders placed this encounter:  No orders of the defined types were placed in this encounter.    Derek Jack, MD [place] [phone]   I, Thana Ates, am acting as a scribe for Dr. Derek Jack.  I, Derek Jack MD, have reviewed the above documentation for accuracy and completeness, and I agree with the above.

## 2021-03-28 ENCOUNTER — Other Ambulatory Visit: Payer: Self-pay | Admitting: Cardiovascular Disease

## 2021-04-16 ENCOUNTER — Telehealth: Payer: Self-pay | Admitting: Cardiovascular Disease

## 2021-04-16 NOTE — Telephone Encounter (Signed)
°*  STAT* If patient is at the pharmacy, call can be transferred to refill team.   1. Which medications need to be refilled? (please list name of each medication and dose if known)  apixaban (ELIQUIS) 5 MG TABS tablet amLODipine (NORVASC) 5 MG tablet clopidogrel (PLAVIX) 75 MG tablet  2. Which pharmacy/location (including street and city if local pharmacy) is medication to be sent to? [place], [place]  3. Do they need a 30 day or 90 day supply? 90 day

## 2021-04-17 MED ORDER — AMLODIPINE BESYLATE 5 MG PO TABS: 5.0000 mg | ORAL_TABLET | Freq: Every day | ORAL | 1 refills | Status: DC

## 2021-04-17 MED ORDER — APIXABAN 5 MG PO TABS: 5.0000 mg | ORAL_TABLET | Freq: Two times a day (BID) | ORAL | 1 refills | Status: DC

## 2021-04-17 MED ORDER — CLOPIDOGREL BISULFATE 75 MG PO TABS: 75.0000 mg | ORAL_TABLET | Freq: Every day | ORAL | 1 refills | Status: DC

## 2021-04-17 NOTE — Telephone Encounter (Signed)
Prescription refill request for Eliquis received. Indication: Afib  Last office visit:07/07/20 [name])  Scr: 0.66 (03/20/21)  Age: 66 Weight: 89.9kg  Appropriate dose and refill sent to requested pharmacy.

## 2021-04-20 ENCOUNTER — Encounter: Payer: Self-pay | Admitting: Infectious Diseases

## 2021-04-20 ENCOUNTER — Other Ambulatory Visit: Payer: Self-pay

## 2021-04-20 ENCOUNTER — Ambulatory Visit (INDEPENDENT_AMBULATORY_CARE_PROVIDER_SITE_OTHER): Payer: Medicare HMO | Admitting: Infectious Diseases

## 2021-04-20 VITALS — BP 146/84 | HR 45 | Temp 98.2°F | Wt 188.0 lb

## 2021-04-20 DIAGNOSIS — Z5181 Encounter for therapeutic drug level monitoring: Secondary | ICD-10-CM | POA: Diagnosis not present

## 2021-04-20 DIAGNOSIS — D849 Immunodeficiency, unspecified: Secondary | ICD-10-CM | POA: Diagnosis not present

## 2021-04-20 DIAGNOSIS — Z227 Latent tuberculosis: Secondary | ICD-10-CM | POA: Diagnosis not present

## 2021-04-20 NOTE — Progress Notes (Signed)
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[place]) 08/09/2020   Atrial fibrillation with RVR ([place]) 05/28/2018   Chronic anticoagulation 05/28/2018   NSTEMI (non-ST elevated myocardial infarction) ([place]) 05/09/2018   Ischemic cardiomyopathy 12/04/2016   Leukocytosis 07/17/2016   Pain in joint involving multiple sites 04/25/2016   Anemia 04/16/2016   Coronary artery disease 04/16/2016   History of atrial fibrillation 04/16/2016   Psoriatic arthritis ([place]) 02/14/2016   High risk medication use 02/14/2016   NICM (nonischemic cardiomyopathy) ([place]) 08/29/2015   Essential hypertension 08/29/2015   Abnormal CT scan, stomach    Hiatal hernia    GERD (gastroesophageal reflux disease) 08/01/2015   Weight loss, unintentional 08/01/2015   Iron deficiency anemia 06/06/2015   Heme positive stool 06/06/2015   PAF (paroxysmal atrial fibrillation) ([place]) 05/30/2015   CAD S/P percutaneous coronary angioplasty 04/07/2015   Old lateral wall myocardial infarction 04/07/2015   Acute on chronic systolic congestive heart failure ([place]) 04/07/2015   LBBB (left bundle branch block) 04/07/2015   Wide-complex tachycardia 04/07/2015   Atrial flutter ([place]) 04/07/2015   Dyslipidemia, goal LDL below 70 03/07/2015   Psoriasis    Rheumatoid arthritis ([place])    Pulmonary fibrosis ([place]) 03/21/2011   Current Outpatient Medications on File Prior to Visit  Medication Sig Dispense Refill   Abatacept (ORENCIA CLICKJECT) 993 MG/ML SOAJ Inject 125 mg into the skin once a week. 12 mL 0   amLODipine (NORVASC) 5 MG tablet Take 1 tablet (5 mg total) by mouth daily. 90 tablet 1   apixaban (ELIQUIS) 5 MG TABS tablet Take 1 tablet (5 mg total) by mouth 2 (two) times daily. 180 tablet 1   atorvastatin (LIPITOR) 80 MG tablet TAKE 1 TABLET EVERY DAY AT  6 PM 90 tablet 3   Clobetasol Prop Emollient Base 0.05 % emollient cream APPLY TO AFFECTED AREA TWICE A DAY AS NEEDED (Patient not taking: Reported on 03/27/2021) 30 g 2   clopidogrel (PLAVIX) 75 MG tablet Take 1 tablet (75 mg total) by mouth daily. 90 tablet 1   furosemide (LASIX) 40 MG tablet Take by mouth.     isoniazid (NYDRAZID) 300 MG tablet Take 1 tablet (300 mg total) by mouth daily. 50 tablet 0   losartan (COZAAR) 100 MG tablet TAKE 1 TABLET (100 MG TOTAL) BY MOUTH DAILY. 90 tablet 3   pantoprazole (PROTONIX) 40 MG tablet TAKE 1 TABLET TWICE DAILY 180 tablet 1   potassium chloride SA (KLOR-CON) 20 MEQ tablet TAKE 2 TABLETS TWICE DAILY 360 tablet 3   predniSONE (DELTASONE) 5 MG tablet TAKE 1 TABLET EVERY DAY WITH BREAKFAST 90 tablet 0   pyridOXINE (VITAMIN B-6) 50 MG tablet Take 1 tablet (50 mg total) by mouth daily. 50 tablet 0   sotalol (BETAPACE) 120 MG tablet TAKE 1 TABLET EVERY 12 HOURS 180 tablet 3   No current facility-administered medications on file prior to visit.    Subjective: Here for Fu for latent TB. He was started on Isoniazid/Pyridoxine approx 8/20, approx a week after visit with Dr Gale Journey. Accompanied by his wife. Taking Isoniazid and Pyridoxine daily. May have missed 1/2 doses since last visit. Denies jaundice, nausea, vomiting, abdominal pain and diarrhea. He is following with Rheumatology for RA and Psoriatic arthritis. He is currently on Orencia injection every week and prednisone 5mg   PO daily. There is a plan for starting Imuran once he completes treatment for latent TB. No concerns otherwise.   Review of Systems: ROS Negative for fevers and chills All other systems reviewed and were negative except as above   Past Medical History:  Diagnosis Date   Abnormal CT scan, stomach    Chronic systolic dysfunction of left ventricle    Coronary artery disease    lateral STEMI 02/22/15 3/10 cutting balloon to ISR of the o/pLCX   GERD (gastroesophageal reflux disease)     Hyperlipidemia    Hypertension    Ischemic cardiomyopathy    LBBB (left bundle branch block)    Leukocytosis    followed by hematology, reactive   Lymphadenopathy    Myocardial infarction ([place]) 02/2015   Psoriasis 2003   Psoriatic arthritis ([place])    Pulmonary fibrosis ([place])    Rheumatoid arthritis(714.0) 2012   Typical atrial flutter ([place])    Past Surgical History:  Procedure Laterality Date   CARDIAC CATHETERIZATION N/A 02/22/2015   Procedure: Left Heart Cath and Coronary Angiography;  Surgeon: Lorretta Harp, MD;  Location: [facility];  Service: Cardiovascular;  Laterality: N/A;   CARDIAC CATHETERIZATION N/A 02/22/2015   Procedure: Coronary Stent Intervention;  Surgeon: Lorretta Harp, MD;  Location: [facility];  Service: Cardiovascular;  Laterality: N/A;   CARDIOVERSION N/A 04/12/2015   Procedure: CARDIOVERSION;  Surgeon: Larey Dresser, MD;  Location: [facility] ENDOSCOPY;  Service: Cardiovascular;  Laterality: N/A;   COLONOSCOPY  07/01/2003   YJE:HUDJSH colonic mucosa except for the proximal right colon in the area of ICV which was not seen completely due to inadequate bowel prep. followed with ACBE which was normal.    COLONOSCOPY N/A 08/24/2015   Dr. Gala Romney: Normal colon. Next colonoscopy in 10 years.   CORONARY BALLOON ANGIOPLASTY N/A 05/12/2018   Procedure: CORONARY BALLOON ANGIOPLASTY;  Surgeon: Belva Crome, MD;  Location: [facility];  Service: Cardiovascular;  Laterality: N/A;   ELECTROPHYSIOLOGIC STUDY N/A 05/30/2015   Atrial fibrillation ablation by Dr Rayann Heman   ESOPHAGOGASTRODUODENOSCOPY N/A 08/24/2015   Dr. Gala Romney: Medium-sized hiatal hernia, erosive gastropathy. Cameron lesions. Esophageal mucosa distally suggestive of short segment Barrett's esophagus. Not confirmed on biopsy. Gastric biopsy with minimal chronic inflammation   GIVENS CAPSULE STUDY N/A 04/17/2016   Procedure: GIVENS CAPSULE STUDY;  Surgeon: Daneil Dolin, MD;  [facility];   Service: Endoscopy;  Laterality: N/A;  Pt to arrive at 8:00 am for 8:30 am appt   LEFT HEART CATH AND CORONARY ANGIOGRAPHY N/A 05/11/2018   Procedure: LEFT HEART CATH AND CORONARY ANGIOGRAPHY;  Surgeon: Belva Crome, MD;  Location: [facility];  Service: Cardiovascular;  Laterality: N/A;   TEE WITHOUT CARDIOVERSION N/A 04/12/2015   Procedure: TRANSESOPHAGEAL ECHOCARDIOGRAM (TEE);  Surgeon: Larey Dresser, MD;  Location: [facility];  Service: Cardiovascular;  Laterality: N/A;    Social History   Tobacco Use   Smoking status: Former    Packs/day: 1.00    Years: 30.00    Pack years: 30.00    Types: Cigarettes    Quit date: 03/21/2003    Years since quitting: 18.0   Smokeless tobacco: Never  Vaping Use   Vaping Use: Never used  Substance Use Topics   Alcohol use: No    Alcohol/week: 0.0 standard drinks    Comment: H/O case of beer weekly x 20 years, quiting in 2000-ish.   Drug use: No  Comment: H/O marijuana use many years ago.    Family History  Problem Relation Age of Onset   Hypertension Mother    Colon cancer Neg Hx     No Known Allergies  Health Maintenance  Topic Date Due   TETANUS/TDAP  Never done   Zoster Vaccines- Shingrix (1 of 2) Never done   COVID-19 Vaccine (6 - Booster for Moderna series) 12/18/2020   COLONOSCOPY (Pts 45-41yrs Insurance coverage will need to be confirmed)  08/23/2025   Pneumonia Vaccine 4+ Years old  Completed   INFLUENZA VACCINE  Completed   Hepatitis C Screening  Completed   HPV VACCINES  Aged Out    Objective: BP (!) 146/84    Pulse (!) 45    Temp 98.2 F (36.8 C) (Oral)    Wt 188 lb (85.3 kg)    BMI 29.16 kg/m    Physical Exam Constitutional:      Appearance: Normal appearance.  HENT:     Head: Normocephalic and atraumatic.      Mouth: Mucous membranes are moist.  Eyes:    Conjunctiva/sclera: Conjunctivae normal. No jaundice     Pupils: Pupils are equal, round  Pulmonary:     Effort: Pulmonary effort is normal  on RA    Breath sounds:   Abdominal:     General:     Palpations: Abdomen non distended   Musculoskeletal:        General: Normal range of motion.   Skin:    General: Skin is warm and dry.     Comments:  Neurological:     General: Grossly non focal     Mental Status: awake, alert and oriented to person, place, and time.   Psychiatric:        Mood and Affect: Mood normal.   Lab Results Lab Results  Component Value Date   WBC 15.3 (H) 03/20/2021   HGB 14.3 03/20/2021   HCT 44.7 03/20/2021   MCV 87.6 03/20/2021   PLT 249 03/20/2021    Lab Results  Component Value Date   CREATININE 0.66 03/20/2021   BUN 12 03/20/2021   NA 139 03/20/2021   K 4.1 03/20/2021   CL 102 03/20/2021   CO2 28 03/20/2021    Lab Results  Component Value Date   ALT 35 03/20/2021   AST 43 (H) 03/20/2021   ALKPHOS 78 03/20/2021   BILITOT 0.5 03/20/2021    Lab Results  Component Value Date   CHOL 140 07/19/2020   HDL 49 07/19/2020   LDLCALC 76 07/19/2020   TRIG 73 07/19/2020   CHOLHDL 2.9 07/19/2020   No results found for: LABRPR, RPRTITER No results found for: HIV1RNAQUANT, HIV1RNAVL, CD4TABS  Problem List Items Addressed This Visit       Respiratory   TB lung, latent - Primary     Other   Medication monitoring encounter   Immunocompromised patient ([place])   Assessment/Plan Latent TB Medication monitoring - Continue Isoniazid and Pyridoxine until 04/23/20 to complete 6 months of tx - Will not do labs as he is going to complete tx  RA/Psoriatic arthritis - On [place] weekly injections and prednisone 5mg  PO daily - Plan to start Imuran once latent TB tx is completed  - Follows Rheumatology   Others Pulmonary Fibrosis/ILD Follows with Pulmonary    CAD and Ischemic Cadiomyopathy/CHF PAF  Follows with Cardiology   I have personally 40 minutes involved in face-to-face and non-face-to-face activities for this patient on the day of the  visit.  Wilber Oliphant, [place] [facility] 04/20/2021, 2:36 PM

## 2021-04-22 DIAGNOSIS — D849 Immunodeficiency, unspecified: Secondary | ICD-10-CM | POA: Insufficient documentation

## 2021-04-24 ENCOUNTER — Telehealth: Payer: Self-pay | Admitting: Rheumatology

## 2021-04-24 NOTE — Telephone Encounter (Signed)
Spoke with patient and advised his labs were normal in January.  Advised he may restart Imuran if he has clearance from ID.Patient states he completed his treatment from ID on Friday.

## 2021-04-24 NOTE — Telephone Encounter (Signed)
His labs were normal in January.  He may restart Imuran if he has clearance from ID.

## 2021-04-24 NOTE — Telephone Encounter (Signed)
Patients wife, [name], called the office stating the patient stopped Azathioprine before he went to infectious disease doctor and they have since cleared him to resume the medication. [name] would like to know if Dr. Estanislado Pandy thinks its ok for him to start the medication again or wait until he is seen again. She states he is in pain since stopping the medication.

## 2021-05-11 NOTE — Progress Notes (Unsigned)
Office Visit Note  Patient: Curtis Flores             Date of Birth: 03/31/1955           MRN: [id number]             PCP: Chesley Noon, MD Referring: Chesley Noon, MD Visit Date: 05/25/2021 Occupation: '@GUAROCC'$ @  Subjective:  No chief complaint on file.   History of Present Illness: Curtis Flores is a 66 y.o. male ***   Activities of Daily Living:  Patient reports morning stiffness for *** {minute/hour:19697}.   Patient {ACTIONS;DENIES/REPORTS:21021675::"Denies"} nocturnal pain.  Difficulty dressing/grooming: {ACTIONS;DENIES/REPORTS:21021675::"Denies"} Difficulty climbing stairs: {ACTIONS;DENIES/REPORTS:21021675::"Denies"} Difficulty getting out of chair: {ACTIONS;DENIES/REPORTS:21021675::"Denies"} Difficulty using hands for taps, buttons, cutlery, and/or writing: {ACTIONS;DENIES/REPORTS:21021675::"Denies"}  No Rheumatology ROS completed.   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[place]) 08/09/2020   Atrial fibrillation with RVR ([place]) 05/28/2018   Chronic anticoagulation 05/28/2018   NSTEMI (non-ST elevated myocardial infarction) ([place]) 05/09/2018   Ischemic cardiomyopathy 12/04/2016   Leukocytosis 07/17/2016   Pain in joint involving multiple sites 04/25/2016   Anemia 04/16/2016   Coronary artery disease 04/16/2016   History of atrial fibrillation 04/16/2016   Psoriatic arthritis ([place]) 02/14/2016   High risk medication use 02/14/2016   NICM (nonischemic cardiomyopathy) ([place]) 08/29/2015   Essential hypertension 08/29/2015   Abnormal CT scan, stomach    Hiatal hernia    GERD (gastroesophageal reflux disease) 08/01/2015   Weight loss, unintentional 08/01/2015   Iron deficiency anemia 06/06/2015   Heme positive stool  06/06/2015   PAF (paroxysmal atrial fibrillation) ([place]) 05/30/2015   CAD S/P percutaneous coronary angioplasty 04/07/2015   Old lateral wall myocardial infarction 04/07/2015   Acute on chronic systolic congestive heart failure ([place]) 04/07/2015   LBBB (left bundle branch block) 04/07/2015   Wide-complex tachycardia 04/07/2015   Atrial flutter ([place]) 04/07/2015   Dyslipidemia, goal LDL below 70 03/07/2015   Psoriasis    Rheumatoid arthritis ([place])    Pulmonary fibrosis ([place]) 03/21/2011    Past Medical History:  Diagnosis Date   Abnormal CT scan, stomach    Chronic systolic dysfunction of left ventricle    Coronary artery disease    lateral STEMI 02/22/15 3/10 cutting balloon to ISR of the o/pLCX   GERD (gastroesophageal reflux disease)    Hyperlipidemia    Hypertension    Ischemic cardiomyopathy    LBBB (left bundle branch block)    Leukocytosis    followed by hematology, reactive   Lymphadenopathy    Myocardial infarction ([place]) 02/2015   Psoriasis 2003   Psoriatic arthritis ([place])    Pulmonary fibrosis ([place])    Rheumatoid arthritis(714.0) 2012   Typical atrial flutter (HCC)     Family History  Problem Relation Age of Onset   Hypertension Mother    Colon cancer Neg Hx    Past Surgical History:  Procedure Laterality Date   CARDIAC CATHETERIZATION N/A 02/22/2015   Procedure: Left Heart Cath and Coronary Angiography;  Surgeon: Lorretta Harp, MD;  Location: [facility];  Service: Cardiovascular;  Laterality: N/A;   CARDIAC CATHETERIZATION N/A 02/22/2015   Procedure: Coronary Stent Intervention;  Surgeon: Lorretta Harp, MD;  Location: [facility];  Service: Cardiovascular;  Laterality: N/A;  CARDIOVERSION N/A 04/12/2015   Procedure: CARDIOVERSION;  Surgeon: Larey Dresser, MD;  Location: [facility] ENDOSCOPY;  Service: Cardiovascular;  Laterality: N/A;   COLONOSCOPY  07/01/2003   SEG:BTDVVO colonic mucosa except for the proximal right colon in the area of ICV which  was not seen completely due to inadequate bowel prep. followed with ACBE which was normal.    COLONOSCOPY N/A 08/24/2015   Dr. Gala Romney: Normal colon. Next colonoscopy in 10 years.   CORONARY BALLOON ANGIOPLASTY N/A 05/12/2018   Procedure: CORONARY BALLOON ANGIOPLASTY;  Surgeon: Belva Crome, MD;  Location: [facility];  Service: Cardiovascular;  Laterality: N/A;   ELECTROPHYSIOLOGIC STUDY N/A 05/30/2015   Atrial fibrillation ablation by Dr Rayann Heman   ESOPHAGOGASTRODUODENOSCOPY N/A 08/24/2015   Dr. Gala Romney: Medium-sized hiatal hernia, erosive gastropathy. Cameron lesions. Esophageal mucosa distally suggestive of short segment Barrett's esophagus. Not confirmed on biopsy. Gastric biopsy with minimal chronic inflammation   GIVENS CAPSULE STUDY N/A 04/17/2016   Procedure: GIVENS CAPSULE STUDY;  Surgeon: Daneil Dolin, MD;  [facility];  Service: Endoscopy;  Laterality: N/A;  Pt to arrive at 8:00 am for 8:30 am appt   LEFT HEART CATH AND CORONARY ANGIOGRAPHY N/A 05/11/2018   Procedure: LEFT HEART CATH AND CORONARY ANGIOGRAPHY;  Surgeon: Belva Crome, MD;  Location: [facility];  Service: Cardiovascular;  Laterality: N/A;   TEE WITHOUT CARDIOVERSION N/A 04/12/2015   Procedure: TRANSESOPHAGEAL ECHOCARDIOGRAM (TEE);  Surgeon: Larey Dresser, MD;  Location: [facility] ENDOSCOPY;  Service: Cardiovascular;  Laterality: N/A;   Social History   Social History Narrative   Not on file   Immunization History  Administered Date(s) Administered   Fluad Quad(high Dose 65+) 12/09/2018, 11/01/2020   Influenza Split 12/22/2010   Influenza Whole 01/18/2010   Influenza, High Dose Seasonal PF 11/11/2017, 12/02/2018   Influenza, Seasonal, Injecte, Preservative Fre 12/03/2014, 12/13/2015, 12/03/2017   Influenza,inj,Quad PF,6+ Mos 11/29/2019   Influenza-Unspecified 12/03/2014, 12/13/2015, 11/12/2016   Moderna Sars-Covid-2 Vaccination 05/08/2019, 06/09/2019, 01/29/2020, 08/09/2020   PFIZER(Purple  Top)SARS-COV-2 Vaccination 10/23/2020   Pneumococcal Conjugate-13 08/20/2016, 04/07/2018   Pneumococcal Polysaccharide-23 01/22/2011, 03/05/2011, 03/09/2021   Pneumococcal-Unspecified 01/22/2011, 03/05/2011     Objective: Vital Signs: There were no vitals taken for this visit.   Physical Exam   Musculoskeletal Exam: ***  CDAI Exam: CDAI Score: -- Patient Global: --; Provider Global: -- Swollen: --; Tender: -- Joint Exam 05/25/2021   No joint exam has been documented for this visit   There is currently no information documented on the homunculus. Go to the Rheumatology activity and complete the homunculus joint exam.  Investigation: No additional findings.  Imaging: No results found.  Recent Labs: Lab Results  Component Value Date   WBC 15.3 (H) 03/20/2021   HGB 14.3 03/20/2021   PLT 249 03/20/2021   NA 139 03/20/2021   K 4.1 03/20/2021   CL 102 03/20/2021   CO2 28 03/20/2021   GLUCOSE 103 (H) 03/20/2021   BUN 12 03/20/2021   CREATININE 0.66 03/20/2021   BILITOT 0.5 03/20/2021   ALKPHOS 78 03/20/2021   AST 43 (H) 03/20/2021   ALT 35 03/20/2021   PROT 7.3 03/20/2021   ALBUMIN 3.4 (L) 03/20/2021   CALCIUM 9.2 03/20/2021   GFRAA 114 03/14/2020   QFTBGOLDPLUS Positive (A) 08/02/2020    Speciality Comments: No specialty comments available.  Procedures:  No procedures performed Allergies: Patient has no known allergies.   Assessment / Plan:     Visit Diagnoses: Rheumatoid arthritis involving  multiple sites with positive rheumatoid factor (HCC)  Psoriatic arthritis (HCC)  Psoriasis  High risk medication use  Pulmonary fibrosis (HCC)  Positive QuantiFERON-TB Gold test  Osteopenia of multiple sites  History of bronchiectasis  History of congestive heart failure  History of gastroesophageal reflux (GERD)  History of hyperlipidemia  History of coronary artery disease  History of atrial fibrillation  History of hypertension  Orders: No  orders of the defined types were placed in this encounter.  No orders of the defined types were placed in this encounter.   Face-to-face time spent with patient was *** minutes. Greater than 50% of time was spent in counseling and coordination of care.  Follow-Up Instructions: No follow-ups on file.   Ofilia Neas, PA-C  Note - This record has been created using Dragon software.  Chart creation errors have been sought, but may not always  have been located. Such creation errors do not reflect on  the standard of medical care.

## 2021-05-15 ENCOUNTER — Other Ambulatory Visit: Payer: Self-pay | Admitting: Cardiovascular Disease

## 2021-05-21 ENCOUNTER — Telehealth: Payer: Self-pay | Admitting: Rheumatology

## 2021-05-21 NOTE — Telephone Encounter (Signed)
Curtis Flores called the office requesting lab orders be sent to [facility] in [place]. She states [employer] on going tomorrow or Wednesday this week. Curtis Flores states Simona Huh' appointment with Lovena Le is Friday 3/24. She requests a call once those have been sent. ?

## 2021-05-21 NOTE — Telephone Encounter (Addendum)
Attempted to contact the patient and left message for patient to advise he is not due for labs in the middle of April 2023.  ?

## 2021-05-25 ENCOUNTER — Encounter: Payer: Self-pay | Admitting: Physician Assistant

## 2021-05-25 ENCOUNTER — Ambulatory Visit (INDEPENDENT_AMBULATORY_CARE_PROVIDER_SITE_OTHER): Payer: Medicare HMO | Admitting: Physician Assistant

## 2021-05-25 ENCOUNTER — Other Ambulatory Visit: Payer: Self-pay

## 2021-05-25 ENCOUNTER — Ambulatory Visit: Payer: Medicare HMO | Admitting: Physician Assistant

## 2021-05-25 VITALS — BP 170/63 | HR 58 | Ht 67.0 in | Wt 200.0 lb

## 2021-05-25 DIAGNOSIS — Z8709 Personal history of other diseases of the respiratory system: Secondary | ICD-10-CM

## 2021-05-25 DIAGNOSIS — Z8639 Personal history of other endocrine, nutritional and metabolic disease: Secondary | ICD-10-CM

## 2021-05-25 DIAGNOSIS — Z79899 Other long term (current) drug therapy: Secondary | ICD-10-CM | POA: Diagnosis not present

## 2021-05-25 DIAGNOSIS — L409 Psoriasis, unspecified: Secondary | ICD-10-CM

## 2021-05-25 DIAGNOSIS — R7612 Nonspecific reaction to cell mediated immunity measurement of gamma interferon antigen response without active tuberculosis: Secondary | ICD-10-CM

## 2021-05-25 DIAGNOSIS — L4050 Arthropathic psoriasis, unspecified: Secondary | ICD-10-CM | POA: Diagnosis not present

## 2021-05-25 DIAGNOSIS — M0579 Rheumatoid arthritis with rheumatoid factor of multiple sites without organ or systems involvement: Secondary | ICD-10-CM

## 2021-05-25 DIAGNOSIS — M8589 Other specified disorders of bone density and structure, multiple sites: Secondary | ICD-10-CM

## 2021-05-25 DIAGNOSIS — Z8679 Personal history of other diseases of the circulatory system: Secondary | ICD-10-CM

## 2021-05-25 DIAGNOSIS — J8410 Pulmonary fibrosis, unspecified: Secondary | ICD-10-CM

## 2021-05-25 DIAGNOSIS — Z8719 Personal history of other diseases of the digestive system: Secondary | ICD-10-CM

## 2021-05-25 MED ORDER — PREDNISONE 5 MG PO TABS: ORAL_TABLET | ORAL | 0 refills | Status: DC

## 2021-05-25 MED ORDER — CLOBETASOL PROP EMOLLIENT BASE 0.05 % EX CREA: TOPICAL_CREAM | CUTANEOUS | 2 refills | Status: DC

## 2021-05-25 MED ORDER — AZATHIOPRINE 50 MG PO TABS: 50.0000 mg | ORAL_TABLET | Freq: Two times a day (BID) | ORAL | 0 refills | Status: DC

## 2021-05-25 NOTE — Progress Notes (Unsigned)
Please review and send pended imuran prescription to the pharmacy. Thanks!  ?

## 2021-05-25 NOTE — Patient Instructions (Signed)
Standing Labs ?We placed an order today for your standing lab work.  ? ?Please have your standing labs drawn in June/ July and every 3 months  ? ?If possible, please have your labs drawn 2 weeks prior to your appointment so that the provider can discuss your results at your appointment. ? ?Please note that you may see your imaging and lab results in [facility] before we have reviewed them. ?We may be awaiting multiple results to interpret others before contacting you. ?Please allow our office up to 72 hours to thoroughly review all of the results before contacting the office for clarification of your results. ? ?We have open lab daily: ?Monday through Thursday from 1:30-4:30 PM and Friday from 1:30-4:00 PM ?at the office of Dr. Bo Merino, [facility].   ?Please be advised, all patients with office appointments requiring lab work will take precedent over walk-in lab work.  ?If possible, please come for your lab work on Monday and Friday afternoons, as you may experience shorter wait times. ?The office is located at [address], [place], [place], [place] [address] ?No appointment is necessary.   ?Labs are drawn by [facility]. Please bring your co-pay at the time of your lab draw.  You may receive a bill from [facility] for your lab work. ? ?Please note if you are on Hydroxychloroquine and and an order has been placed for a Hydroxychloroquine level, you will need to have it drawn 4 hours or more after your last dose. ? ?If you wish to have your labs drawn at another location, please call the office 24 hours in advance to send orders. ? ?If you have any questions regarding directions or hours of operation,  ?please call [phone].   ?As a reminder, please drink plenty of water prior to coming for your lab work. Thanks! ? ?

## 2021-05-26 LAB — CBC WITH DIFFERENTIAL/PLATELET
Absolute Monocytes: 1095 cells/uL — ABNORMAL HIGH (ref 200–950)
Basophils Absolute: 74 cells/uL (ref 0–200)
Basophils Relative: 0.5 %
Eosinophils Absolute: 104 cells/uL (ref 15–500)
Eosinophils Relative: 0.7 %
HCT: 42.4 % (ref 38.5–50.0)
Hemoglobin: 13.6 g/dL (ref 13.2–17.1)
Lymphs Abs: 2472 cells/uL (ref 850–3900)
MCH: 26.6 pg — ABNORMAL LOW (ref 27.0–33.0)
MCHC: 32.1 g/dL (ref 32.0–36.0)
MCV: 82.8 fL (ref 80.0–100.0)
MPV: 11.6 fL (ref 7.5–12.5)
Monocytes Relative: 7.4 %
Neutro Abs: 11056 cells/uL — ABNORMAL HIGH (ref 1500–7800)
Neutrophils Relative %: 74.7 %
Platelets: 266 10*3/uL (ref 140–400)
RBC: 5.12 10*6/uL (ref 4.20–5.80)
RDW: 12.7 % (ref 11.0–15.0)
Total Lymphocyte: 16.7 %
WBC: 14.8 10*3/uL — ABNORMAL HIGH (ref 3.8–10.8)

## 2021-05-26 LAB — COMPLETE METABOLIC PANEL WITH GFR
AG Ratio: 1.2 (calc) (ref 1.0–2.5)
ALT: 23 U/L (ref 9–46)
AST: 24 U/L (ref 10–35)
Albumin: 3.6 g/dL (ref 3.6–5.1)
Alkaline phosphatase (APISO): 76 U/L (ref 35–144)
BUN/Creatinine Ratio: 15 (calc) (ref 6–22)
BUN: 10 mg/dL (ref 7–25)
CO2: 30 mmol/L (ref 20–32)
Calcium: 9.4 mg/dL (ref 8.6–10.3)
Chloride: 103 mmol/L (ref 98–110)
Creat: 0.68 mg/dL — ABNORMAL LOW (ref 0.70–1.35)
Globulin: 3.1 g/dL (calc) (ref 1.9–3.7)
Glucose, Bld: 90 mg/dL (ref 65–99)
Potassium: 4.6 mmol/L (ref 3.5–5.3)
Sodium: 140 mmol/L (ref 135–146)
Total Bilirubin: 0.5 mg/dL (ref 0.2–1.2)
Total Protein: 6.7 g/dL (ref 6.1–8.1)
eGFR: 101 mL/min/{1.73_m2} (ref >=60)

## 2021-05-28 NOTE — Progress Notes (Signed)
CBC and CMP stable.  Patient was advised at his last office visit to schedule a consult with hematology.  He was in agreement.

## 2021-05-29 NOTE — Progress Notes (Signed)
Patient was evaluated by Dr. Delton Coombes on 03/27/21.

## 2021-06-11 ENCOUNTER — Other Ambulatory Visit: Payer: Self-pay | Admitting: *Deleted

## 2021-06-11 ENCOUNTER — Telehealth: Payer: Self-pay

## 2021-06-11 MED ORDER — ORENCIA CLICKJECT 125 MG/ML ~~LOC~~ SOAJ
125.0000 mg | SUBCUTANEOUS | 0 refills | Status: DC
Start: 2021-06-11 — End: 2021-08-27

## 2021-06-11 NOTE — Telephone Encounter (Signed)
Next Visit: 09/21/2021 ? ?Last Visit: 05/25/2021 ? ?Last Fill: 02/27/2021 ? ?DX: Rheumatoid arthritis involving multiple sites with positive rheumatoid factor  ? ?Current Dose per office note 05/25/2021: Orencia 125 mg sq weekly injections  ? ?Labs: 05/25/2021, CBC and CMP stable.   ? ?TB Gold: 08/02/2020, positive Patient's wife [name] advised TB gold is positive.    ?Advised her to hold Orencia and Imuran.  He can continue on prednisone.    ?Urgent referral placed to ID for further evaluation ? ?Okay to refill Orencia? ?

## 2021-06-11 NOTE — Telephone Encounter (Signed)
[name] called requesting prescription refill for Orencia to be sent to [facility]. ?

## 2021-06-11 NOTE — Telephone Encounter (Signed)
Patient has undergone treatment for TB and will require CXR on a yearly basis starting next year.

## 2021-08-15 ENCOUNTER — Other Ambulatory Visit: Payer: Self-pay | Admitting: Physician Assistant

## 2021-08-15 ENCOUNTER — Other Ambulatory Visit: Payer: Self-pay | Admitting: Rheumatology

## 2021-08-15 DIAGNOSIS — L409 Psoriasis, unspecified: Secondary | ICD-10-CM

## 2021-08-15 DIAGNOSIS — L4050 Arthropathic psoriasis, unspecified: Secondary | ICD-10-CM

## 2021-08-15 DIAGNOSIS — M0579 Rheumatoid arthritis with rheumatoid factor of multiple sites without organ or systems involvement: Secondary | ICD-10-CM

## 2021-08-15 DIAGNOSIS — Z79899 Other long term (current) drug therapy: Secondary | ICD-10-CM

## 2021-08-15 MED ORDER — PREDNISONE 5 MG PO TABS: ORAL_TABLET | ORAL | 0 refills | Status: DC

## 2021-08-15 NOTE — Telephone Encounter (Signed)
[name] called the office stating the patient refilled his Prednisone through [facility] and they told him it will take 7-10 days before they would receive it. She states [facility] has only 7 pills left and they were requesting a 30 day supply for [facility] to have until his [place] shipment comes in. [facility] [place].

## 2021-08-15 NOTE — Telephone Encounter (Signed)
Spoke with patient's wife [name] and advised we have not sent a prescription to [facility] and they have not requested a refill. She states they would like to have the 90 day supply sent to local pharmacy instead.   Next Visit: 09/21/2021   Last Visit: 05/25/2021   Last Fill: 05/25/2021  DX: Rheumatoid arthritis involving multiple sites with positive rheumatoid factor    Current Dose per office note 05/25/2021: prednisone 5 mg daily  Okay to refill Prednisone?

## 2021-08-22 ENCOUNTER — Other Ambulatory Visit: Payer: Self-pay | Admitting: Physician Assistant

## 2021-08-22 DIAGNOSIS — Z79899 Other long term (current) drug therapy: Secondary | ICD-10-CM

## 2021-08-22 NOTE — Telephone Encounter (Signed)
Next Visit: 09/21/2021  Last Visit: 05/25/2021  Last Fill: 05/25/2021  DX: Rheumatoid arthritis involving multiple sites with positive rheumatoid factor   Current Dose per office note 05/25/2021: Imuran 50 mg 1 tablet twice daily   Labs: 05/25/2021 CBC and CMP stable  Left message to advise patient he is due to update labs.   Okay to refill Imuran?

## 2021-08-24 ENCOUNTER — Other Ambulatory Visit: Payer: Self-pay | Admitting: *Deleted

## 2021-08-24 DIAGNOSIS — Z79899 Other long term (current) drug therapy: Secondary | ICD-10-CM

## 2021-08-27 ENCOUNTER — Other Ambulatory Visit: Payer: Self-pay | Admitting: *Deleted

## 2021-08-27 MED ORDER — ORENCIA CLICKJECT 125 MG/ML ~~LOC~~ SOAJ: 125.0000 mg | SUBCUTANEOUS | 0 refills | Status: DC

## 2021-08-28 ENCOUNTER — Other Ambulatory Visit (HOSPITAL_COMMUNITY)
Admission: RE | Admit: 2021-08-28 | Discharge: 2021-08-28 | Disposition: A | Discharge status: Home / Self Care | Payer: Medicare HMO | Attending: Hematology | Admitting: Hematology

## 2021-08-28 DIAGNOSIS — Z79899 Other long term (current) drug therapy: Secondary | ICD-10-CM | POA: Insufficient documentation

## 2021-08-28 LAB — COMPREHENSIVE METABOLIC PANEL
ALT: 20 U/L (ref 0–44)
AST: 23 U/L (ref 15–41)
Albumin: 3 g/dL — ABNORMAL LOW (ref 3.5–5.0)
Alkaline Phosphatase: 79 U/L (ref 38–126)
Anion gap: 8 (ref 5–15)
BUN: 13 mg/dL (ref 8–23)
CO2: 26 mmol/L (ref 22–32)
Calcium: 8.8 mg/dL — ABNORMAL LOW (ref 8.9–10.3)
Chloride: 102 mmol/L (ref 98–111)
Creatinine, Ser: 0.72 mg/dL (ref 0.61–1.24)
GFR, Estimated: >60 mL/min (ref >60)
Glucose, Bld: 106 mg/dL — ABNORMAL HIGH (ref 70–99)
Potassium: 4.4 mmol/L (ref 3.5–5.1)
Sodium: 136 mmol/L (ref 135–145)
Total Bilirubin: 0.7 mg/dL (ref 0.3–1.2)
Total Protein: 7 g/dL (ref 6.5–8.1)

## 2021-08-28 LAB — CBC WITH DIFFERENTIAL/PLATELET
Abs Immature Granulocytes: 0.06 10*3/uL (ref 0.00–0.07)
Basophils Absolute: 0.1 10*3/uL (ref 0.0–0.1)
Basophils Relative: 1 %
Eosinophils Absolute: 0.1 10*3/uL (ref 0.0–0.5)
Eosinophils Relative: 1 %
HCT: 41.5 % (ref 39.0–52.0)
Hemoglobin: 13.2 g/dL (ref 13.0–17.0)
Immature Granulocytes: 0 %
Lymphocytes Relative: 18 %
Lymphs Abs: 2.4 10*3/uL (ref 0.7–4.0)
MCH: 26.3 pg (ref 26.0–34.0)
MCHC: 31.8 g/dL (ref 30.0–36.0)
MCV: 82.7 fL (ref 80.0–100.0)
Monocytes Absolute: 1.1 10*3/uL — ABNORMAL HIGH (ref 0.1–1.0)
Monocytes Relative: 8 %
Neutro Abs: 10 10*3/uL — ABNORMAL HIGH (ref 1.7–7.7)
Neutrophils Relative %: 72 %
Platelets: 232 10*3/uL (ref 150–400)
RBC: 5.02 MIL/uL (ref 4.22–5.81)
RDW: 15.8 % — ABNORMAL HIGH (ref 11.5–15.5)
WBC: 13.8 10*3/uL — ABNORMAL HIGH (ref 4.0–10.5)
nRBC: 0 % (ref 0.0–0.2)

## 2021-08-29 ENCOUNTER — Other Ambulatory Visit: Payer: Self-pay | Admitting: Cardiovascular Disease

## 2021-09-04 ENCOUNTER — Other Ambulatory Visit: Payer: Self-pay | Admitting: Physician Assistant

## 2021-09-07 NOTE — Progress Notes (Deleted)
Office Visit Note  Patient: Curtis Flores             Date of Birth: September 08, 1955           MRN: [id number]             PCP: Chesley Noon, MD Referring: Chesley Noon, MD Visit Date: 09/21/2021 Occupation: '@GUAROCC'$ @  Subjective:  No chief complaint on file.   History of Present Illness: Curtis Flores is a 66 y.o. male ***   Activities of Daily Living:  Patient reports morning stiffness for *** {minute/hour:19697}.   Patient {ACTIONS;DENIES/REPORTS:21021675::"Denies"} nocturnal pain.  Difficulty dressing/grooming: {ACTIONS;DENIES/REPORTS:21021675::"Denies"} Difficulty climbing stairs: {ACTIONS;DENIES/REPORTS:21021675::"Denies"} Difficulty getting out of chair: {ACTIONS;DENIES/REPORTS:21021675::"Denies"} Difficulty using hands for taps, buttons, cutlery, and/or writing: {ACTIONS;DENIES/REPORTS:21021675::"Denies"}  No Rheumatology ROS completed.   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) 08/09/2020   Atrial fibrillation with RVR (Curtis Flores) 05/28/2018   Chronic anticoagulation 05/28/2018   NSTEMI (non-ST elevated myocardial infarction) ([place]) 05/09/2018   Ischemic cardiomyopathy 12/04/2016   Leukocytosis 07/17/2016   Pain in joint involving multiple sites 04/25/2016   Anemia 04/16/2016   Coronary artery disease 04/16/2016   History of atrial fibrillation 04/16/2016   Psoriatic arthritis (Curtis Flores) 02/14/2016   High risk medication use 02/14/2016   NICM (nonischemic cardiomyopathy) (Curtis Flores) 08/29/2015   Essential hypertension 08/29/2015   Abnormal CT scan, stomach    Hiatal hernia    GERD (gastroesophageal reflux disease) 08/01/2015   Weight loss, unintentional 08/01/2015   Iron deficiency anemia 06/06/2015   Heme positive stool  06/06/2015   PAF (paroxysmal atrial fibrillation) (Curtis Flores) 05/30/2015   CAD S/P percutaneous coronary angioplasty 04/07/2015   Old lateral wall myocardial infarction 04/07/2015   Acute on chronic systolic congestive heart failure ([place]) 04/07/2015   LBBB (left bundle branch block) 04/07/2015   Wide-complex tachycardia 04/07/2015   Atrial flutter ([place]) 04/07/2015   Dyslipidemia, goal LDL below 70 03/07/2015   Psoriasis    Rheumatoid arthritis (Curtis Flores)    Pulmonary fibrosis ([place]) 03/21/2011    Past Medical History:  Diagnosis Date   Abnormal CT scan, stomach    Chronic systolic dysfunction of left ventricle    Coronary artery disease    lateral STEMI 02/22/15 3/10 cutting balloon to ISR of the o/pLCX   GERD (gastroesophageal reflux disease)    Hyperlipidemia    Hypertension    Ischemic cardiomyopathy    LBBB (left bundle branch block)    Leukocytosis    followed by hematology, reactive   Lymphadenopathy    Myocardial infarction (Curtis Flores) 02/2015   Psoriasis 2003   Psoriatic arthritis (Curtis Flores)    Pulmonary fibrosis (Curtis Flores)    Rheumatoid arthritis(714.0) 2012   Typical atrial flutter ([facility])     Family History  Problem Relation Age of Onset   Hypertension Mother    Colon cancer Neg Hx    Past Surgical History:  Procedure Laterality Date   CARDIAC CATHETERIZATION N/A 02/22/2015   Procedure: Left Heart Cath and Coronary Angiography;  Surgeon: Lorretta Harp, MD;  Location: [place] [facility];  Service: Cardiovascular;  Laterality: N/A;   CARDIAC CATHETERIZATION N/A 02/22/2015   Procedure: Coronary Stent Intervention;  Surgeon: Lorretta Harp, MD;  Location: [place] [facility];  Service: Cardiovascular;  Laterality: N/A;  CARDIOVERSION N/A 04/12/2015   Procedure: CARDIOVERSION;  Surgeon: Larey Dresser, MD;  Location: [facility] ENDOSCOPY;  Service: Cardiovascular;  Laterality: N/A;   COLONOSCOPY  07/01/2003   [id number] colonic mucosa except for the proximal right colon in the area of ICV which  was not seen completely due to inadequate bowel prep. followed with ACBE which was normal.    COLONOSCOPY N/A 08/24/2015   Dr. Gala Romney: Normal colon. Next colonoscopy in 10 years.   CORONARY BALLOON ANGIOPLASTY N/A 05/12/2018   Procedure: CORONARY BALLOON ANGIOPLASTY;  Surgeon: Belva Crome, MD;  Location: [place] [facility];  Service: Cardiovascular;  Laterality: N/A;   ELECTROPHYSIOLOGIC STUDY N/A 05/30/2015   Atrial fibrillation ablation by Dr Rayann Heman   ESOPHAGOGASTRODUODENOSCOPY N/A 08/24/2015   Dr. Gala Romney: Medium-sized hiatal hernia, erosive gastropathy. Cameron lesions. Esophageal mucosa distally suggestive of short segment Barrett's esophagus. Not confirmed on biopsy. Gastric biopsy with minimal chronic inflammation   GIVENS CAPSULE STUDY N/A 04/17/2016   Procedure: GIVENS CAPSULE STUDY;  Surgeon: Daneil Dolin, MD;  [facility];  Service: Endoscopy;  Laterality: N/A;  Pt to arrive at 8:00 am for 8:30 am appt   LEFT HEART CATH AND CORONARY ANGIOGRAPHY N/A 05/11/2018   Procedure: LEFT HEART CATH AND CORONARY ANGIOGRAPHY;  Surgeon: Belva Crome, MD;  Location: [place] [facility];  Service: Cardiovascular;  Laterality: N/A;   TEE WITHOUT CARDIOVERSION N/A 04/12/2015   Procedure: TRANSESOPHAGEAL ECHOCARDIOGRAM (TEE);  Surgeon: Larey Dresser, MD;  Location: [facility] ENDOSCOPY;  Service: Cardiovascular;  Laterality: N/A;   Social History   Social History Narrative   Not on file   Immunization History  Administered Date(s) Administered   Fluad Quad(high Dose 65+) 12/09/2018, 11/01/2020   Influenza Split 12/22/2010   Influenza Whole 01/18/2010   Influenza, High Dose Seasonal PF 11/11/2017, 12/02/2018   Influenza, Seasonal, Injecte, Preservative Fre 12/03/2014, 12/13/2015, 12/03/2017   Influenza,inj,Quad PF,6+ Mos 11/29/2019   Influenza-Unspecified 12/03/2014, 12/13/2015, 11/12/2016   Moderna Sars-Covid-2 Vaccination 05/08/2019, 06/09/2019, 01/29/2020, 08/09/2020   PFIZER(Purple  Top)SARS-COV-2 Vaccination 10/23/2020   Pneumococcal Conjugate-13 08/20/2016, 04/07/2018   Pneumococcal Polysaccharide-23 01/22/2011, 03/05/2011, 03/09/2021   Pneumococcal-Unspecified 01/22/2011, 03/05/2011     Objective: Vital Signs: There were no vitals taken for this visit.   Physical Exam   Musculoskeletal Exam: ***  CDAI Exam: CDAI Score: -- Patient Global: --; Provider Global: -- Swollen: --; Tender: -- Joint Exam 09/21/2021   No joint exam has been documented for this visit   There is currently no information documented on the homunculus. Go to the Rheumatology activity and complete the homunculus joint exam.  Investigation: No additional findings.  Imaging: No results found.  Recent Labs: Lab Results  Component Value Date   WBC 13.8 (H) 08/28/2021   HGB 13.2 08/28/2021   PLT 232 08/28/2021   NA 136 08/28/2021   K 4.4 08/28/2021   CL 102 08/28/2021   CO2 26 08/28/2021   GLUCOSE 106 (H) 08/28/2021   BUN 13 08/28/2021   CREATININE 0.72 08/28/2021   BILITOT 0.7 08/28/2021   ALKPHOS 79 08/28/2021   AST 23 08/28/2021   ALT 20 08/28/2021   PROT 7.0 08/28/2021   ALBUMIN 3.0 (L) 08/28/2021   CALCIUM 8.8 (L) 08/28/2021   GFRAA 114 03/14/2020   QFTBGOLDPLUS Positive (A) 08/02/2020    Speciality Comments: No specialty comments available.  Procedures:  No procedures performed Allergies: Patient has no known allergies.   Assessment / Plan:     Visit Diagnoses: No diagnosis found.  Orders: No orders of the defined types were placed in this encounter.  No orders of the defined types were placed in this encounter.   Face-to-face time spent with patient was *** minutes. Greater than 50% of time was spent in counseling and coordination of care.  Follow-Up Instructions: No follow-ups on file.   Earnestine Mealing, CMA  Note - This record has been created using Editor, commissioning.  Chart creation errors have been sought, but may not always  have been  located. Such creation errors do not reflect on  the standard of medical care.

## 2021-09-20 ENCOUNTER — Ambulatory Visit (HOSPITAL_COMMUNITY)
Admission: RE | Admit: 2021-09-20 | Discharge: 2021-09-20 | Disposition: A | Discharge status: Home / Self Care | Payer: Medicare HMO | Source: Ambulatory Visit | Attending: Pulmonary Disease | Admitting: Pulmonary Disease

## 2021-09-20 DIAGNOSIS — J849 Interstitial pulmonary disease, unspecified: Secondary | ICD-10-CM | POA: Insufficient documentation

## 2021-09-20 DIAGNOSIS — J8410 Pulmonary fibrosis, unspecified: Secondary | ICD-10-CM | POA: Insufficient documentation

## 2021-09-21 ENCOUNTER — Ambulatory Visit: Payer: Medicare HMO | Admitting: Rheumatology

## 2021-09-21 DIAGNOSIS — R7612 Nonspecific reaction to cell mediated immunity measurement of gamma interferon antigen response without active tuberculosis: Secondary | ICD-10-CM

## 2021-09-21 DIAGNOSIS — L409 Psoriasis, unspecified: Secondary | ICD-10-CM

## 2021-09-21 DIAGNOSIS — Z79899 Other long term (current) drug therapy: Secondary | ICD-10-CM

## 2021-09-21 DIAGNOSIS — L4050 Arthropathic psoriasis, unspecified: Secondary | ICD-10-CM

## 2021-09-21 DIAGNOSIS — Z8639 Personal history of other endocrine, nutritional and metabolic disease: Secondary | ICD-10-CM

## 2021-09-21 DIAGNOSIS — M0579 Rheumatoid arthritis with rheumatoid factor of multiple sites without organ or systems involvement: Secondary | ICD-10-CM

## 2021-09-21 DIAGNOSIS — Z8709 Personal history of other diseases of the respiratory system: Secondary | ICD-10-CM

## 2021-09-21 DIAGNOSIS — Z8679 Personal history of other diseases of the circulatory system: Secondary | ICD-10-CM

## 2021-09-21 DIAGNOSIS — M8589 Other specified disorders of bone density and structure, multiple sites: Secondary | ICD-10-CM

## 2021-09-21 DIAGNOSIS — J8410 Pulmonary fibrosis, unspecified: Secondary | ICD-10-CM

## 2021-09-21 DIAGNOSIS — Z8719 Personal history of other diseases of the digestive system: Secondary | ICD-10-CM

## 2021-09-21 NOTE — Progress Notes (Deleted)
Office Visit Note  Patient: Curtis Flores             Date of Birth: 02/16/1956           MRN: [id number]             PCP: Chesley Noon, MD Referring: Chesley Noon, MD Visit Date: 10/05/2021 Occupation: '@GUAROCC'$ @  Subjective:  No chief complaint on file.   History of Present Illness: Curtis Flores is a 66 y.o. male ***   Activities of Daily Living:  Patient reports morning stiffness for *** {minute/hour:19697}.   Patient {ACTIONS;DENIES/REPORTS:21021675::"Denies"} nocturnal pain.  Difficulty dressing/grooming: {ACTIONS;DENIES/REPORTS:21021675::"Denies"} Difficulty climbing stairs: {ACTIONS;DENIES/REPORTS:21021675::"Denies"} Difficulty getting out of chair: {ACTIONS;DENIES/REPORTS:21021675::"Denies"} Difficulty using hands for taps, buttons, cutlery, and/or writing: {ACTIONS;DENIES/REPORTS:21021675::"Denies"}  No Rheumatology ROS completed.   PMFS History:  Patient Active Problem List   Diagnosis Date Noted  . Immunocompromised patient ([place]) 04/22/2021  . Cataract 03/23/2021  . Immunization counseling 03/09/2021  . Medication monitoring encounter 12/01/2020  . TB lung, 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  . Immunodeficiency due to drugs ([place]) 08/09/2020  . Atrial fibrillation with RVR ([place]) 05/28/2018  . Chronic anticoagulation 05/28/2018  . NSTEMI (non-ST elevated myocardial infarction) ([place]) 05/09/2018  . Ischemic cardiomyopathy 12/04/2016  . Leukocytosis 07/17/2016  . Pain in joint involving multiple sites 04/25/2016  . Anemia 04/16/2016  . Coronary artery disease 04/16/2016  . History of atrial fibrillation 04/16/2016  . Psoriatic arthritis ([place]) 02/14/2016  . High risk medication use 02/14/2016  . NICM (nonischemic cardiomyopathy) ([place]) 08/29/2015  . Essential hypertension 08/29/2015  . Abnormal CT scan, stomach   . Hiatal hernia   . GERD (gastroesophageal reflux disease) 08/01/2015  . Weight loss, unintentional 08/01/2015  . Iron deficiency anemia 06/06/2015  .  Heme positive stool 06/06/2015  . PAF (paroxysmal atrial fibrillation) ([place]) 05/30/2015  . CAD S/P percutaneous coronary angioplasty 04/07/2015  . 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 Acute on chronic systolic congestive heart failure ([place]) 04/07/2015  . LBBB (left bundle branch block) 04/07/2015  . Wide-complex tachycardia 04/07/2015  . Atrial flutter ([place]) 04/07/2015  . Dyslipidemia, goal LDL below 70 03/07/2015  . Psoriasis   . Rheumatoid arthritis ([place])   . Pulmonary fibrosis ([place]) 03/21/2011    Past Medical History:  Diagnosis Date  . Abnormal CT scan, stomach   . Chronic systolic dysfunction of left ventricle   . Coronary artery disease    lateral STEMI 02/22/15 3/10 cutting balloon to ISR of the o/pLCX  . GERD (gastroesophageal reflux disease)   . Hyperlipidemia   . Hypertension   . Ischemic cardiomyopathy   . LBBB (left bundle branch block)   . Leukocytosis    followed by hematology, reactive  . Lymphadenopathy   . Myocardial infarction ([place]) 02/2015  . Psoriasis 2003  . Psoriatic arthritis ([place])   . Pulmonary fibrosis ([place])   . Rheumatoid arthritis(714.0) 2012  . Typical atrial flutter ([facility])     Family History  Problem Relation Age of Onset  . Hypertension Mother   . Colon cancer Neg Hx    Past Surgical History:  Procedure Laterality Date  . CARDIAC CATHETERIZATION N/A 02/22/2015   Procedure: Left Heart Cath and Coronary Angiography;  Surgeon: Lorretta Harp, MD;  Location: [place] [facility];  Service: Cardiovascular;  Laterality: N/A;  . CARDIAC CATHETERIZATION N/A 02/22/2015   Procedure: Coronary Stent Intervention;  Surgeon: Lorretta Harp, MD;  Location: [place] [facility];  Service: Cardiovascular;  Laterality: N/A;  .  CARDIOVERSION N/A 04/12/2015   Procedure: CARDIOVERSION;  Surgeon: Larey Dresser, MD;  Location: [facility] ENDOSCOPY;  Service: Cardiovascular;  Laterality: N/A;  . COLONOSCOPY  07/01/2003   [id number] colonic mucosa except for  the proximal right colon in the area of ICV which was not seen completely due to inadequate bowel prep. followed with ACBE which was normal.   . COLONOSCOPY N/A 08/24/2015   Dr. Gala Romney: Normal colon. Next colonoscopy in 10 years.  . CORONARY BALLOON ANGIOPLASTY N/A 05/12/2018   Procedure: CORONARY BALLOON ANGIOPLASTY;  Surgeon: Belva Crome, MD;  Location: [place] [facility];  Service: Cardiovascular;  Laterality: N/A;  . ELECTROPHYSIOLOGIC STUDY N/A 05/30/2015   Atrial fibrillation ablation by Dr Rayann Heman  . ESOPHAGOGASTRODUODENOSCOPY N/A 08/24/2015   Dr. Gala Romney: Medium-sized hiatal hernia, erosive gastropathy. Cameron lesions. Esophageal mucosa distally suggestive of short segment Barrett's esophagus. Not confirmed on biopsy. Gastric biopsy with minimal chronic inflammation  . GIVENS CAPSULE STUDY N/A 04/17/2016   Procedure: GIVENS CAPSULE STUDY;  Surgeon: Daneil Dolin, MD;  [facility];  Service: Endoscopy;  Laterality: N/A;  Pt to arrive at 8:00 am for 8:30 am appt  . LEFT HEART CATH AND CORONARY ANGIOGRAPHY N/A 05/11/2018   Procedure: LEFT HEART CATH AND CORONARY ANGIOGRAPHY;  Surgeon: Belva Crome, MD;  Location: [place] [facility];  Service: Cardiovascular;  Laterality: N/A;  . TEE WITHOUT CARDIOVERSION N/A 04/12/2015   Procedure: TRANSESOPHAGEAL ECHOCARDIOGRAM (TEE);  Surgeon: Larey Dresser, MD;  Location: [facility] ENDOSCOPY;  Service: Cardiovascular;  Laterality: N/A;   Social History   Social History Narrative  . Not on file   Immunization History  Administered Date(s) Administered  . Fluad Quad(high Dose 65+) 12/09/2018, 11/01/2020  . Influenza Split 12/22/2010  . Influenza Whole 01/18/2010  . Influenza, High Dose Seasonal PF 11/11/2017, 12/02/2018  . Influenza, Seasonal, Injecte, Preservative Fre 12/03/2014, 12/13/2015, 12/03/2017  . Influenza,inj,Quad PF,6+ Mos 11/29/2019  . Influenza-Unspecified 12/03/2014, 12/13/2015, 11/12/2016  . Moderna Sars-Covid-2 Vaccination  05/08/2019, 06/09/2019, 01/29/2020, 08/09/2020  . PFIZER(Purple Top)SARS-COV-2 Vaccination 10/23/2020  . Pneumococcal Conjugate-13 08/20/2016, 04/07/2018  . Pneumococcal Polysaccharide-23 01/22/2011, 03/05/2011, 03/09/2021  . Pneumococcal-Unspecified 01/22/2011, 03/05/2011     Objective: Vital Signs: There were no vitals taken for this visit.   Physical Exam   Musculoskeletal Exam: ***  CDAI Exam: CDAI Score: -- Patient Global: --; Provider Global: -- Swollen: --; Tender: -- Joint Exam 10/05/2021   No joint exam has been documented for this visit   There is currently no information documented on the homunculus. Go to the Rheumatology activity and complete the homunculus joint exam.  Investigation: No additional findings.  Imaging: No results found.  Recent Labs: Lab Results  Component Value Date   WBC 13.8 (H) 08/28/2021   HGB 13.2 08/28/2021   PLT 232 08/28/2021   NA 136 08/28/2021   K 4.4 08/28/2021   CL 102 08/28/2021   CO2 26 08/28/2021   GLUCOSE 106 (H) 08/28/2021   BUN 13 08/28/2021   CREATININE 0.72 08/28/2021   BILITOT 0.7 08/28/2021   ALKPHOS 79 08/28/2021   AST 23 08/28/2021   ALT 20 08/28/2021   PROT 7.0 08/28/2021   ALBUMIN 3.0 (L) 08/28/2021   CALCIUM 8.8 (L) 08/28/2021   GFRAA 114 03/14/2020   QFTBGOLDPLUS Positive (A) 08/02/2020    Speciality Comments: No specialty comments available.  Procedures:  No procedures performed Allergies: Patient has no known allergies.   Assessment / Plan:     Visit Diagnoses: No diagnosis found.  Orders: No orders of the defined types were placed in this encounter.  No orders of the defined types were placed in this encounter.   Face-to-face time spent with patient was *** minutes. Greater than 50% of time was spent in counseling and coordination of care.  Follow-Up Instructions: No follow-ups on file.   Earnestine Mealing, CMA  Note - This record has been created using Editor, commissioning.  Chart  creation errors have been sought, but may not always  have been located. Such creation errors do not reflect on  the standard of medical care.

## 2021-09-22 ENCOUNTER — Other Ambulatory Visit: Payer: Self-pay | Admitting: Cardiovascular Disease

## 2021-09-26 ENCOUNTER — Encounter: Payer: Self-pay | Admitting: Pulmonary Disease

## 2021-09-26 ENCOUNTER — Ambulatory Visit (INDEPENDENT_AMBULATORY_CARE_PROVIDER_SITE_OTHER): Payer: Medicare HMO | Admitting: Pulmonary Disease

## 2021-09-26 DIAGNOSIS — M05741 Rheumatoid arthritis with rheumatoid factor of right hand without organ or systems involvement: Secondary | ICD-10-CM

## 2021-09-26 DIAGNOSIS — M05742 Rheumatoid arthritis with rheumatoid factor of left hand without organ or systems involvement: Secondary | ICD-10-CM | POA: Diagnosis not present

## 2021-09-26 DIAGNOSIS — J8410 Pulmonary fibrosis, unspecified: Secondary | ICD-10-CM | POA: Diagnosis not present

## 2021-09-26 NOTE — Assessment & Plan Note (Signed)
Appears unchanged compared to 1 year ago.  He does have extensive fibrosis and symptoms of dyspnea and cough related to this. He would be a good candidate for pulmonary rehab but he lives 30 minutes away from the center. We reviewed HRCT, alternative pattern is favored and I feel that this is rheumatoid lung

## 2021-09-26 NOTE — Progress Notes (Signed)
   Subjective:    Patient ID: Curtis Flores, male    DOB: December 07, 1955, 66 y.o.   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    He completed 6 months of INH treatment for latent TB he was then started on Imuran 50 twice daily by rheumatology and seems to be tolerating well. Still complains of dyspnea on exertion, no interim infections or flares He remains on 5 mg of prednisone and Orencia every week He follows with hematology for leukocytosis  We discussed CT scan today.  He reports intermittent reflux and CT showed hiatal hernia. 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      PFTs 2013 showed intraprenchymal restricton with FVC around 71% & DLCO 56% c/w ILD, no airway obstruction.   Rpt PFTs 10/2012 show mild drop in FVC to 66%, DLCO preserved     PFT 05/2015 FEV1 70%, ratio 82, FVC 66%, no BD response, DLCO 46%. DLCO has decreased from 2014.    PFT 12/2015 >> FVC 71%, DLCO 45% unc    PFTs 03/2018 show drop in FVC to 53%, TLC to 49%, DLCO maintained at 41%   PFTs 06/2019 moderate restriction, ratio 83, FVC 53%, DLCO stable at 11.8/28%   PFTs 05/2020 stabl, FVC 1.93/57%, TLC 55%, DLCO 11.50 /48 %   Review of Systems neg for any significant sore throat, dysphagia, itching, sneezing, nasal congestion or excess/ purulent secretions, fever, chills, sweats, unintended wt loss, pleuritic or exertional cp, hempoptysis, orthopnea pnd or change in chronic leg swelling. Also denies presyncope, palpitations, heartburn, abdominal pain, nausea, vomiting, diarrhea or change in bowel or urinary habits, dysuria,hematuria, rash, arthralgias, visual complaints, headache, numbness weakness or ataxia.     Objective:   Physical Exam  Gen. Pleasant, elderly,well-nourished, in no distress ENT - no thrush, no pallor/icterus,no post nasal drip Neck: No JVD, no thyromegaly, no carotid bruits Lungs: no use of accessory muscles, no dullness to percussion,bibasal dry rales Cardiovascular: Rhythm regular, heart sounds  normal, no murmurs or gallops, no peripheral edema Musculoskeletal: No deformities, no cyanosis or clubbing          Assessment & Plan:

## 2021-09-26 NOTE — Assessment & Plan Note (Signed)
Being treated with Orencia, prednisone and Imuran has been added which he seems to be tolerating well

## 2021-09-26 NOTE — Patient Instructions (Signed)
  CT shows unchanged fibrosis  You would benefit from a rehab program

## 2021-10-05 ENCOUNTER — Ambulatory Visit: Payer: Medicare HMO | Admitting: Physician Assistant

## 2021-10-05 DIAGNOSIS — L4050 Arthropathic psoriasis, unspecified: Secondary | ICD-10-CM

## 2021-10-05 DIAGNOSIS — Z8719 Personal history of other diseases of the digestive system: Secondary | ICD-10-CM

## 2021-10-05 DIAGNOSIS — J8410 Pulmonary fibrosis, unspecified: Secondary | ICD-10-CM

## 2021-10-05 DIAGNOSIS — R7612 Nonspecific reaction to cell mediated immunity measurement of gamma interferon antigen response without active tuberculosis: Secondary | ICD-10-CM

## 2021-10-05 DIAGNOSIS — Z79899 Other long term (current) drug therapy: Secondary | ICD-10-CM

## 2021-10-05 DIAGNOSIS — L409 Psoriasis, unspecified: Secondary | ICD-10-CM

## 2021-10-05 DIAGNOSIS — Z8639 Personal history of other endocrine, nutritional and metabolic disease: Secondary | ICD-10-CM

## 2021-10-05 DIAGNOSIS — Z8679 Personal history of other diseases of the circulatory system: Secondary | ICD-10-CM

## 2021-10-05 DIAGNOSIS — M0579 Rheumatoid arthritis with rheumatoid factor of multiple sites without organ or systems involvement: Secondary | ICD-10-CM

## 2021-10-05 DIAGNOSIS — M8589 Other specified disorders of bone density and structure, multiple sites: Secondary | ICD-10-CM

## 2021-10-05 DIAGNOSIS — Z8709 Personal history of other diseases of the respiratory system: Secondary | ICD-10-CM

## 2021-10-05 NOTE — Progress Notes (Signed)
Office Visit Note  Patient: Curtis Flores             Date of Birth: 02/27/1956           MRN: [id number]             PCP: Chesley Noon, MD Referring: Chesley Noon, MD Visit Date: 10/19/2021 Occupation: '@GUAROCC'$ @  Subjective:  Pain in multiple joints   History of Present Illness: COLBURN ASPER is a 66 y.o. male with history of seropositive rheumatoid arthritis and psoriatic arthritis. He remain on Orencia 125 mg sq weekly injections and Imuran 50 mg 1 tablet twice daily.  He is taking prednisone 5 mg 1 tablet by mouth daily.  Patient has been experiencing ongoing pain in both shoulders, both hands, and both feet.  He has noticed increased swelling in his hands and occasionally swelling in his toes.  He has been taking Tylenol more frequently to manage his symptoms.  He has been trying to walk for exercise.  He is considering getting a gym membership since he has been unable to go to pulmonary rehab due to the travel distance.  He continues to follow-up closely with Dr. Elsworth Soho for management of pulmonary fibrosis.  He had a high-resolution chest CT on 09/20/2021.  No changes were recommended at this time. He continues to have psoriasis on his scalp but has not noticed any other new or worsening of patches. He has not had any recent or recurrent infections.  He denies any new medical conditions.  His energy level has been stable.     Activities of Daily Living:  Patient reports morning stiffness for 1 hour.   Patient Reports nocturnal pain.  Difficulty dressing/grooming: Denies Difficulty climbing stairs: Denies Difficulty getting out of chair: Denies Difficulty using hands for taps, buttons, cutlery, and/or writing: Reports  Review of Systems  Constitutional:  Negative for fatigue.  HENT:  Positive for mouth dryness. Negative for mouth sores.   Eyes:  Positive for dryness.  Respiratory:  Positive for shortness of breath.   Cardiovascular:  Negative for chest pain and  palpitations.  Gastrointestinal:  Negative for blood in stool, constipation and diarrhea.  Endocrine: Negative for increased urination.  Genitourinary:  Negative for involuntary urination.  Musculoskeletal:  Positive for joint pain, gait problem, joint pain, joint swelling, muscle weakness and morning stiffness. Negative for myalgias, muscle tenderness and myalgias.  Skin:  Negative for color change, rash and sensitivity to sunlight.  Allergic/Immunologic: Negative for susceptible to infections.  Neurological:  Negative for dizziness and headaches.  Hematological:  Negative for swollen glands.  Psychiatric/Behavioral:  Positive for sleep disturbance. Negative for depressed mood. The patient is not nervous/anxious.     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   Immunodeficiency due to drugs ([place]) 08/09/2020   Atrial fibrillation with RVR ([place]) 05/28/2018   Chronic anticoagulation 05/28/2018   NSTEMI (non-ST elevated myocardial infarction) ([place]) 05/09/2018   Ischemic cardiomyopathy 12/04/2016   Leukocytosis 07/17/2016   Pain in joint involving multiple sites 04/25/2016   Anemia 04/16/2016   Coronary artery disease 04/16/2016   History of atrial fibrillation 04/16/2016   Psoriatic arthritis ([place]) 02/14/2016   High risk medication use 02/14/2016   NICM (nonischemic cardiomyopathy) ([place]) 08/29/2015   Essential hypertension 08/29/2015   Abnormal CT scan,  stomach    Hiatal hernia    GERD (gastroesophageal reflux disease) 08/01/2015   Weight loss, unintentional 08/01/2015   Iron deficiency anemia 06/06/2015   Heme positive stool 06/06/2015   PAF (paroxysmal atrial fibrillation) ([place]) 05/30/2015   CAD S/P percutaneous coronary angioplasty 04/07/2015   Old lateral wall myocardial  infarction 04/07/2015   Acute on chronic systolic congestive heart failure ([place]) 04/07/2015   LBBB (left bundle branch block) 04/07/2015   Wide-complex tachycardia 04/07/2015   Atrial flutter ([place]) 04/07/2015   Dyslipidemia, goal LDL below 70 03/07/2015   Psoriasis    Rheumatoid arthritis ([place])    Pulmonary fibrosis ([place]) 03/21/2011    Past Medical History:  Diagnosis Date   Abnormal CT scan, stomach    Chronic systolic dysfunction of left ventricle    Coronary artery disease    lateral STEMI 02/22/15 3/10 cutting balloon to ISR of the o/pLCX   GERD (gastroesophageal reflux disease)    Hyperlipidemia    Hypertension    Ischemic cardiomyopathy    LBBB (left bundle branch block)    Leukocytosis    followed by hematology, reactive   Lymphadenopathy    Myocardial infarction ([place]) 02/2015   Psoriasis 2003   Psoriatic arthritis ([place])    Pulmonary fibrosis ([place])    Rheumatoid arthritis(714.0) 2012   Typical atrial flutter ([place])     Family History  Problem Relation Age of Onset   Hypertension Mother    Colon cancer Neg Hx    Past Surgical History:  Procedure Laterality Date   CARDIAC CATHETERIZATION N/A 02/22/2015   Procedure: Left Heart Cath and Coronary Angiography;  Surgeon: Lorretta Harp, MD;  Location: [facility];  Service: Cardiovascular;  Laterality: N/A;   CARDIAC CATHETERIZATION N/A 02/22/2015   Procedure: Coronary Stent Intervention;  Surgeon: Lorretta Harp, MD;  Location: [facility];  Service: Cardiovascular;  Laterality: N/A;   CARDIOVERSION N/A 04/12/2015   Procedure: CARDIOVERSION;  Surgeon: Larey Dresser, MD;  Location[facility];  Service: Cardiovascular;  Laterality: N/A;   COLONOSCOPY  07/01/2003   NUU:VOZDGU colonic mucosa except for the proximal right colon in the area of ICV which was not seen completely due to inadequate bowel prep. followed with ACBE which was normal.    COLONOSCOPY N/A 08/24/2015   Dr. Gala Romney: Normal colon. Next  colonoscopy in 10 years.   CORONARY BALLOON ANGIOPLASTY N/A 05/12/2018   Procedure: CORONARY BALLOON ANGIOPLASTY;  Surgeon: Belva Crome, MD;  Location: [facility];  Service: Cardiovascular;  Laterality: N/A;   ELECTROPHYSIOLOGIC STUDY N/A 05/30/2015   Atrial fibrillation ablation by Dr Rayann Heman   ESOPHAGOGASTRODUODENOSCOPY N/A 08/24/2015   Dr. Gala Romney: Medium-sized hiatal hernia, erosive gastropathy. Cameron lesions. Esophageal mucosa distally suggestive of short segment Barrett's esophagus. Not confirmed on biopsy. Gastric biopsy with minimal chronic inflammation   GIVENS CAPSULE STUDY N/A 04/17/2016   Procedure: GIVENS CAPSULE STUDY;  Surgeon: Daneil Dolin, MD;  [facility];  Service: Endoscopy;  Laterality: N/A;  Pt to arrive at 8:00 am for 8:30 am appt   LEFT HEART CATH AND CORONARY ANGIOGRAPHY N/A 05/11/2018   Procedure: LEFT HEART CATH AND CORONARY ANGIOGRAPHY;  Surgeon: Belva Crome, MD;  Location: [facility];  Service: Cardiovascular;  Laterality: N/A;   TEE WITHOUT CARDIOVERSION N/A 04/12/2015   Procedure: TRANSESOPHAGEAL ECHOCARDIOGRAM (TEE);  Surgeon: Larey Dresser, MD;  Location: [facility];  Service: Cardiovascular;  Laterality: N/A;   Social History   Social  History Narrative   Not on file   Immunization History  Administered Date(s) Administered   Fluad Quad(high Dose 65+) 12/09/2018, 11/01/2020   Influenza Split 12/22/2010   Influenza Whole 01/18/2010   Influenza, High Dose Seasonal PF 11/11/2017, 12/02/2018   Influenza, Seasonal, Injecte, Preservative Fre 12/03/2014, 12/13/2015, 12/03/2017   Influenza,inj,Quad PF,6+ Mos 11/29/2019   Influenza-Unspecified 12/03/2014, 12/13/2015, 11/12/2016   Moderna Sars-Covid-2 Vaccination 05/08/2019, 06/09/2019, 01/29/2020, 08/09/2020   PFIZER(Purple Top)SARS-COV-2 Vaccination 10/23/2020   Pneumococcal Conjugate-13 08/20/2016, 04/07/2018   Pneumococcal Polysaccharide-23 01/22/2011, 03/05/2011, 03/09/2021    Pneumococcal-Unspecified 01/22/2011, 03/05/2011     Objective: Vital Signs: BP 131/73 (BP Location: Left Arm, Patient Position: Sitting, Cuff Size: Normal)   Pulse (!) 50   Resp 17   Ht '5\' 7"'$  (1.702 m)   Wt 192 lb 12.8 oz (87.5 kg)   BMI 30.20 kg/m    Physical Exam Vitals and nursing note reviewed.  Constitutional:      Appearance: He is well-developed.  HENT:     Head: Normocephalic and atraumatic.  Eyes:     Conjunctiva/sclera: Conjunctivae normal.     Pupils: Pupils are equal, round, and reactive to light.  Cardiovascular:     Rate and Rhythm: Normal rate and regular rhythm.     Heart sounds: Normal heart sounds.  Pulmonary:     Effort: Pulmonary effort is normal.     Breath sounds: Normal breath sounds.  Abdominal:     General: Bowel sounds are normal.     Palpations: Abdomen is soft.  Musculoskeletal:     Cervical back: Normal range of motion and neck supple.  Skin:    General: Skin is warm and dry.     Capillary Refill: Capillary refill takes less than 2 seconds.  Neurological:     Mental Status: He is alert and oriented to person, place, and time.  Psychiatric:        Behavior: Behavior normal.      Musculoskeletal Exam: C-spine has slightly limited ROM with lateral rotation.  Shoulder joints have good ROM with some stiffness and discomfort bilaterally.  Wrist joints have good ROM with no synovitis.  Synovial thickening of all MCP joints.  PIP and DIP thickening.  Synovitis and tenderness over bilateral 2nd, 3rd, and 5th MCP joints.  Hip joints have good ROM with no groin pain.  Knee joints have good ROM with no warmth or effusion.  Ankle joints have good ROM with some tenderness bilaterally.   CDAI Exam: CDAI Score: 15  Patient Global: 5 mm; Provider Global: 5 mm Swollen: 6 ; Tender: 10  Joint Exam 10/19/2021      Right  Left  Glenohumeral   Tender   Tender  MCP 2  Swollen Tender  Swollen Tender  MCP 3  Swollen Tender  Swollen Tender  MCP 5  Swollen  Tender  Swollen Tender  Ankle   Tender   Tender     Investigation: No additional findings.  Imaging: CT CHEST HIGH RESOLUTION  Result Date: 09/21/2021 CLINICAL DATA:  Interstitial lung disease, chronic shortness of breath EXAM: CT CHEST WITHOUT CONTRAST TECHNIQUE: Multidetector CT imaging of the chest was performed following the standard protocol without intravenous contrast. High resolution imaging of the lungs, as well as inspiratory and expiratory imaging, was performed. RADIATION DOSE REDUCTION: This exam was performed according to the departmental dose-optimization program which includes automated exposure control, adjustment of the mA and/or kV according to patient size and/or use of iterative reconstruction technique. COMPARISON:  09/12/2020,  12/21/2019, 05/12/2015 FINDINGS: Cardiovascular: Aortic atherosclerosis. Cardiomegaly. Three-vessel coronary artery calcifications. No pericardial effusion. Mediastinum/Nodes: No enlarged mediastinal, hilar, or axillary lymph nodes. Moderate hiatal hernia with intrathoracic position of the gastric fundus. Thyroid gland, trachea, and esophagus demonstrate no significant findings. Lungs/Pleura: No significant change in moderate to severe pulmonary fibrosis in a pattern with apical to basal gradient, featuring cystic change and bronchiolectasis with associated ground-glass and extensive subpleural sparing about the lung bases. Diffuse bilateral bronchial wall thickening. No significant air trapping on expiratory phase imaging. No pleural effusion or pneumothorax. Upper Abdomen: No acute abnormality. Musculoskeletal: No chest wall abnormality. No suspicious osseous lesions identified. IMPRESSION: 1. No significant change in moderate to severe pulmonary fibrosis in a pattern with apical to basal gradient, featuring cystic change and bronchiolectasis with associated ground-glass and extensive subpleural sparing about the lung bases. Findings are suggestive of an  alternative diagnosis (not UIP) per consensus guidelines: Diagnosis of Idiopathic Pulmonary Fibrosis: An Official ATS/ERS/JRS/ALAT Clinical Practice Guideline. [place], Iss 5, [id number], Nov 02 2016. 2. Diffuse bilateral bronchial wall thickening, consistent with nonspecific infectious or inflammatory bronchitis. 3. Cardiomegaly and coronary artery disease. 4. Hiatal hernia. Aortic Atherosclerosis (ICD10-I70.0). Electronically Signed   By: Delanna Ahmadi M.D.   On: 09/21/2021 15:25    Recent Labs: Lab Results  Component Value Date   WBC 13.8 (H) 08/28/2021   HGB 13.2 08/28/2021   PLT 232 08/28/2021   NA 136 08/28/2021   K 4.4 08/28/2021   CL 102 08/28/2021   CO2 26 08/28/2021   GLUCOSE 106 (H) 08/28/2021   BUN 13 08/28/2021   CREATININE 0.72 08/28/2021   BILITOT 0.7 08/28/2021   ALKPHOS 79 08/28/2021   AST 23 08/28/2021   ALT 20 08/28/2021   PROT 7.0 08/28/2021   ALBUMIN 3.0 (L) 08/28/2021   CALCIUM 8.8 (L) 08/28/2021   GFRAA 114 03/14/2020   QFTBGOLDPLUS Positive (A) 08/02/2020    Speciality Comments: No specialty comments available.  Procedures:  No procedures performed Allergies: Patient has no known allergies.      Assessment / Plan:     Visit Diagnoses: Rheumatoid arthritis involving multiple sites with positive rheumatoid factor (HCC) - Severe disease: He presents today with increased joint pain and joint stiffness involving multiple joints including both shoulders, both hands, and both feet.  He has noticed intermittent inflammation in his hands and feet and has had some difficulty performing ADLs at times.  He has tenderness and synovitis over bilateral second, third, and fifth MCP joints on examination.  He has had to take Tylenol frequently to manage his symptoms.  He remains on Orencia 125 mg subcu injections once weekly and Imuran 50 mg 2 tablets daily.  He is tolerating combination therapy without any side effects or injection site reactions.   He has not had any recent or recurrent infections.  His lab work has been stable.  He was advised to increase the dose of Imuran to 50 mg 3 tablets daily and remain on the current dose of Orencia as prescribed.  He will also remain on prednisone 5 mg daily.  He was advised to notify us if he cannot tolerate taking the increased dose of Imuran.  He will follow-up for updated lab work in 2 weeks to monitor for drug toxicity.  He will follow-up in the office in 3 months or sooner if needed.  Psoriatic arthritis [facility]): He continues to experience intermittent pain and stiffness involving multiple joints, most severe in  both shoulders, both hands, both feet.  He is experiencing some discomfort due to Achilles tendinitis in both feet.  He has ongoing tenderness and synovitis in both hands.  He remains on prednisone 5 mg daily, Orencia 125 mg subcu injections once weekly, and Imuran 50 mg 2 tablets daily.  The dose of Imuran was increased to 3 tablets daily as discussed above.  He was advised to notify us if his symptoms persist or worsen.  Psoriasis: Scalp-A prescription for clobetasol external scalp solution was sent to the pharmacy today which she can apply twice daily as needed.  High risk medication use - Advised the patient to increase imuran to 50 mg 3 tablets by mouth daily.  He will remain on current dose of prednisone 5 mg daily and orencia 125 mg sq injections once weekly.  CBC and CMP updated on 08/28/21. He will be due to update lab work in September and every 3 months.  Standing orders for CBC and CMP remain in place.  He has not had any recent or recurrent infections.  Discussed the importance of holding Orencia and Imuran if he develops signs or symptoms of an infection and to resume once the infection is completely cleared. He will require yearly CXR given his history of latent TB.   He has not had any recent or recurrent infections.   Pulmonary fibrosis ([place]) - Followed by Dr. Elsworth Soho.  PFTs  updated 05/16/2020.   Followed by Dr. Elsworth Soho.  High-resolution chest CT is ordered on 09/20/2021 revealed no significant change in moderate to severe pulmonary fibrosis in pattern with apical to basal gradient, featuring cystic change and bronchiectasis with associated groundglass and extensive subpleural sparing about the lung bases. Ongoing symptoms of dyspnea and cough related to pulmonary fibrosis.  Dr. Elsworth Soho recommended pulmonary rehab which the patient has not proceeded with due to travel distance.  He is considering getting a gym membership if his gym allows for Silver sneakers.  Positive QuantiFERON-TB Gold test - Dr. Gale Journey who initiated INH/B6.  He has completed therapy for latent TB and was cleared to resume imuran as prescribed.  He will require yearly chest x-rays.  Osteopenia of multiple sites - DEXA updated on 04/28/2020: FRAX not reported because of the T-scores for spine total, hip total, femoral neck at or above -1.0.   Other medical conditions are listed as follows:   History of congestive heart failure - Dr. [name]  History of bronchiectasis  History of hyperlipidemia  History of gastroesophageal reflux (GERD)  History of atrial fibrillation  History of coronary artery disease  History of hypertension: BP was 131/73 today in the office.   Orders: No orders of the defined types were placed in this encounter.  Meds ordered this encounter  Medications   azaTHIOprine (IMURAN) 50 MG tablet    Sig: Take 3 tablets (150 mg total) by mouth daily.    Dispense:  90 tablet    Refill:  2   clobetasol (TEMOVATE) 0.05 % external solution    Sig: Apply 1 Application topically 2 (two) times daily.    Dispense:  50 mL    Refill:  0     Follow-Up Instructions: Return in about 3 months (around 01/19/2022) for Rheumatoid arthritis, Psoriatic arthritis.   Ofilia Neas, PA-C  Note - This record has been created using Dragon software.  Chart creation errors have been sought, but may  not always  have been located. Such creation errors do not reflect on  the standard  of medical care.

## 2021-10-19 ENCOUNTER — Ambulatory Visit: Payer: Medicare HMO | Attending: Rheumatology | Admitting: Physician Assistant

## 2021-10-19 ENCOUNTER — Encounter: Payer: Self-pay | Admitting: Physician Assistant

## 2021-10-19 VITALS — BP 131/73 | HR 50 | Resp 17 | Ht 67.0 in | Wt 192.8 lb

## 2021-10-19 DIAGNOSIS — L4050 Arthropathic psoriasis, unspecified: Secondary | ICD-10-CM

## 2021-10-19 DIAGNOSIS — Z8719 Personal history of other diseases of the digestive system: Secondary | ICD-10-CM

## 2021-10-19 DIAGNOSIS — M0579 Rheumatoid arthritis with rheumatoid factor of multiple sites without organ or systems involvement: Secondary | ICD-10-CM | POA: Diagnosis not present

## 2021-10-19 DIAGNOSIS — R7612 Nonspecific reaction to cell mediated immunity measurement of gamma interferon antigen response without active tuberculosis: Secondary | ICD-10-CM

## 2021-10-19 DIAGNOSIS — J8410 Pulmonary fibrosis, unspecified: Secondary | ICD-10-CM

## 2021-10-19 DIAGNOSIS — L409 Psoriasis, unspecified: Secondary | ICD-10-CM

## 2021-10-19 DIAGNOSIS — Z79899 Other long term (current) drug therapy: Secondary | ICD-10-CM | POA: Diagnosis not present

## 2021-10-19 DIAGNOSIS — M8589 Other specified disorders of bone density and structure, multiple sites: Secondary | ICD-10-CM

## 2021-10-19 DIAGNOSIS — Z8639 Personal history of other endocrine, nutritional and metabolic disease: Secondary | ICD-10-CM

## 2021-10-19 DIAGNOSIS — Z8709 Personal history of other diseases of the respiratory system: Secondary | ICD-10-CM

## 2021-10-19 DIAGNOSIS — Z8679 Personal history of other diseases of the circulatory system: Secondary | ICD-10-CM

## 2021-10-19 MED ORDER — CLOBETASOL PROPIONATE 0.05 % EX SOLN
1.0000 | Freq: Two times a day (BID) | CUTANEOUS | 0 refills | Status: DC
Start: 2021-10-19 — End: 2023-01-13

## 2021-10-19 MED ORDER — AZATHIOPRINE 50 MG PO TABS
150.0000 mg | ORAL_TABLET | Freq: Every day | ORAL | 2 refills | Status: DC
Start: 2021-10-19 — End: 2021-12-21

## 2021-10-19 NOTE — Patient Instructions (Addendum)
Standing Labs We placed an order today for your standing lab work.   Please have your standing labs drawn end of September and every 3 months   If possible, please have your labs drawn 2 weeks prior to your appointment so that the provider can discuss your results at your appointment.  Please note that you may see your imaging and lab results in [place] before we have reviewed them. We may be awaiting multiple results to interpret others before contacting you. Please allow our office up to 72 hours to thoroughly review all of the results before contacting the office for clarification of your results.  We currently have open lab daily: Monday through Thursday from 1:30 PM-4:30 PM and Friday from 1:30 PM- 4:00 PM If possible, please come for your lab work on Monday, Thursday or Friday afternoons, as you may experience shorter wait times.   Effective January 02, 2022 the new lab hours will change to: Monday through Thursday from 1:30 PM-5:00 PM and Friday from 8:30 AM-12:00 PM If possible, please come for your lab work on Monday and Thursday afternoons, as you may experience shorter wait times.  Please be advised, all patients with office appointments requiring lab work will take precedent over walk-in lab work.    The office is located at [address] [place], [place], [place] [address] No appointment is necessary.   Labs are drawn by [facility]. Please bring your co-pay at the time of your lab draw.  You may receive a bill from [facility] for your lab work.  Please note if you are on Hydroxychloroquine and and an order has been placed for a Hydroxychloroquine level, you will need to have it drawn 4 hours or more after your last dose.  If you wish to have your labs drawn at another location, please call the office 24 hours in advance to send orders.  If you have any questions regarding directions or hours of operation,  please call [phone].   As a reminder, please drink plenty of water  prior to coming for your lab work. Thanks!  If you have signs or symptoms of an infection or start antibiotics: First, call your PCP for workup of your infection. Hold your medication through the infection, until you complete your antibiotics, and until symptoms resolve if you take the following: Injectable medication (Actemra, Benlysta, Cimzia, Cosentyx, Enbrel, Humira, Kevzara, Orencia, Remicade, Simponi, Stelara, Taltz, Tremfya) Methotrexate Leflunomide (Arava) Mycophenolate (Cellcept) Morrie Sheldon, Olumiant, or Rinvoq

## 2021-11-02 ENCOUNTER — Other Ambulatory Visit: Payer: Self-pay | Admitting: *Deleted

## 2021-11-02 MED ORDER — ORENCIA CLICKJECT 125 MG/ML ~~LOC~~ SOAJ: 125.0000 mg | SUBCUTANEOUS | 0 refills | Status: DC

## 2021-11-02 NOTE — Telephone Encounter (Signed)
He will require yearly CXR going forward.

## 2021-11-02 NOTE — Telephone Encounter (Signed)
Refill request received via fax from [facility] for [place].  Next Visit: 03/26/2022  Last Visit: 10/19/2021  Last Fill: 08/27/2021  DX: Rheumatoid arthritis involving multiple sites with positive rheumatoid factor   Current Dose per office note 10/19/2021: Orencia 125 mg subcu injections once weekly  Labs: 08/28/2021 Glucose is 106. Corrected calcium is WNL.rest of CMP WNL.  WBC count remains elevated but is trending down. Absolute neutrophils and monocytes remain elevated but stable.   Chest X-ray: 08/15/2020 Chronic interstitial lung disease without acute cardiopulmonary findings.   Okay to refill Orencia?  Patient had a chest CT 09/20/2021, will he need to update his chest x-ray?

## 2021-11-06 ENCOUNTER — Telehealth: Payer: Self-pay | Admitting: Rheumatology

## 2021-11-06 DIAGNOSIS — Z79899 Other long term (current) drug therapy: Secondary | ICD-10-CM

## 2021-11-06 NOTE — Telephone Encounter (Signed)
Patient's wife [name] called requesting labwork orders be sent to [facility] in [place].  Patient plans to go on Friday, 11/09/21.

## 2021-11-06 NOTE — Telephone Encounter (Signed)
Lab Orders released.  

## 2021-11-08 ENCOUNTER — Other Ambulatory Visit (HOSPITAL_COMMUNITY)
Admission: RE | Admit: 2021-11-08 | Discharge: 2021-11-08 | Disposition: A | Discharge status: Home / Self Care | Payer: Medicare HMO | Source: Ambulatory Visit | Attending: Rheumatology | Admitting: Rheumatology

## 2021-11-08 DIAGNOSIS — Z79899 Other long term (current) drug therapy: Secondary | ICD-10-CM | POA: Insufficient documentation

## 2021-11-08 LAB — CBC WITH DIFFERENTIAL/PLATELET
Abs Immature Granulocytes: 0.06 10*3/uL (ref 0.00–0.07)
Basophils Absolute: 0.1 10*3/uL (ref 0.0–0.1)
Basophils Relative: 1 %
Eosinophils Absolute: 0.1 10*3/uL (ref 0.0–0.5)
Eosinophils Relative: 0 %
HCT: 40.6 % (ref 39.0–52.0)
Hemoglobin: 12.9 g/dL — ABNORMAL LOW (ref 13.0–17.0)
Immature Granulocytes: 1 %
Lymphocytes Relative: 21 %
Lymphs Abs: 2.6 10*3/uL (ref 0.7–4.0)
MCH: 26.7 pg (ref 26.0–34.0)
MCHC: 31.8 g/dL (ref 30.0–36.0)
MCV: 83.9 fL (ref 80.0–100.0)
Monocytes Absolute: 0.8 10*3/uL (ref 0.1–1.0)
Monocytes Relative: 6 %
Neutro Abs: 8.8 10*3/uL — ABNORMAL HIGH (ref 1.7–7.7)
Neutrophils Relative %: 71 %
Platelets: 290 10*3/uL (ref 150–400)
RBC: 4.84 MIL/uL (ref 4.22–5.81)
RDW: 15.9 % — ABNORMAL HIGH (ref 11.5–15.5)
WBC: 12.3 10*3/uL — ABNORMAL HIGH (ref 4.0–10.5)
nRBC: 0 % (ref 0.0–0.2)

## 2021-11-08 LAB — COMPREHENSIVE METABOLIC PANEL
ALT: 23 U/L (ref 0–44)
AST: 26 U/L (ref 15–41)
Albumin: 3.1 g/dL — ABNORMAL LOW (ref 3.5–5.0)
Alkaline Phosphatase: 84 U/L (ref 38–126)
Anion gap: 6 (ref 5–15)
BUN: 12 mg/dL (ref 8–23)
CO2: 28 mmol/L (ref 22–32)
Calcium: 9.1 mg/dL (ref 8.9–10.3)
Chloride: 104 mmol/L (ref 98–111)
Creatinine, Ser: 0.64 mg/dL (ref 0.61–1.24)
GFR, Estimated: >60 mL/min (ref >60)
Glucose, Bld: 102 mg/dL — ABNORMAL HIGH (ref 70–99)
Potassium: 4.5 mmol/L (ref 3.5–5.1)
Sodium: 138 mmol/L (ref 135–145)
Total Bilirubin: 0.8 mg/dL (ref 0.3–1.2)
Total Protein: 7.2 g/dL (ref 6.5–8.1)

## 2021-11-11 ENCOUNTER — Other Ambulatory Visit: Payer: Self-pay | Admitting: Rheumatology

## 2021-11-11 DIAGNOSIS — L409 Psoriasis, unspecified: Secondary | ICD-10-CM

## 2021-11-11 DIAGNOSIS — M0579 Rheumatoid arthritis with rheumatoid factor of multiple sites without organ or systems involvement: Secondary | ICD-10-CM

## 2021-11-11 DIAGNOSIS — Z79899 Other long term (current) drug therapy: Secondary | ICD-10-CM

## 2021-11-11 DIAGNOSIS — L4050 Arthropathic psoriasis, unspecified: Secondary | ICD-10-CM

## 2021-11-12 NOTE — Telephone Encounter (Signed)
Next Visit: 03/26/2022   Last Visit: 10/19/2021   Last Fill: 08/15/2021  DX: Rheumatoid arthritis involving multiple sites with positive rheumatoid factor    Current Dose per office note 10/19/2021: prednisone 5 mg daily   Okay to refill Prednisone?

## 2021-11-29 ENCOUNTER — Other Ambulatory Visit: Payer: Self-pay | Admitting: Cardiovascular Disease

## 2021-12-21 ENCOUNTER — Other Ambulatory Visit: Payer: Self-pay | Admitting: Physician Assistant

## 2021-12-21 ENCOUNTER — Other Ambulatory Visit: Payer: Self-pay | Admitting: Cardiovascular Disease

## 2021-12-21 DIAGNOSIS — L409 Psoriasis, unspecified: Secondary | ICD-10-CM

## 2021-12-21 DIAGNOSIS — Z79899 Other long term (current) drug therapy: Secondary | ICD-10-CM

## 2021-12-21 DIAGNOSIS — L4050 Arthropathic psoriasis, unspecified: Secondary | ICD-10-CM

## 2021-12-21 DIAGNOSIS — M0579 Rheumatoid arthritis with rheumatoid factor of multiple sites without organ or systems involvement: Secondary | ICD-10-CM

## 2021-12-21 MED ORDER — AZATHIOPRINE 50 MG PO TABS
150.0000 mg | ORAL_TABLET | Freq: Every day | ORAL | 2 refills | Status: DC
Start: 2021-12-21 — End: 2022-05-21

## 2021-12-21 NOTE — Telephone Encounter (Signed)
Next Visit: 03/26/2022   Last Visit: 10/19/2021   Last Fill: 10/19/2021   DX: Rheumatoid arthritis involving multiple sites with positive rheumatoid factor    Current Dose per office note 10/19/2021: Imuran to 50 mg 3 tablets daily   Labs: 11/08/2021 WBC 12.3, Hgb 12.9, RDW 15.9, Neutro Abs 8.8, Glucose 102, Albumin 3.1,   Okay to refill Imuran?

## 2021-12-21 NOTE — Telephone Encounter (Signed)
Prescription refill request for Curtis Flores received. Indication: afib  Last office visit: Curtis Flores 07/07/2020 Scr: 0.64, 11/08/2021 Age: 66 yo  Weight: 87.5 kg   Pt is overdue to see cardiologist. Curtis Flores to schedulers to make an appointment.

## 2022-01-10 ENCOUNTER — Telehealth: Payer: Self-pay | Admitting: Pharmacist

## 2022-01-10 NOTE — Telephone Encounter (Signed)
Received fax from [facility] requesting completion of PAP 6184 renewal application for Orencia. Patient's wife requested his portion be mailed to home. Reminded them of income document requirement.  Provider portion placed in Hazel Sams, PA-C's, folder for signature.  Case # [id number]  Knox Saliva, PharmD, MPH, BCPS, CPP Clinical Pharmacist (Rheumatology and Pulmonology)

## 2022-01-14 NOTE — Telephone Encounter (Signed)
Signed provider form received from Hazel Sams, PA-C. Submission pending patient portion  Knox Saliva, PharmD, MPH, BCPS, CPP Clinical Pharmacist (Rheumatology and Pulmonology)

## 2022-01-15 ENCOUNTER — Other Ambulatory Visit: Payer: Self-pay | Admitting: Physician Assistant

## 2022-01-15 ENCOUNTER — Other Ambulatory Visit: Payer: Self-pay | Admitting: Cardiovascular Disease

## 2022-01-15 ENCOUNTER — Other Ambulatory Visit: Payer: Self-pay | Admitting: Gastroenterology

## 2022-01-15 DIAGNOSIS — Z79899 Other long term (current) drug therapy: Secondary | ICD-10-CM

## 2022-01-15 DIAGNOSIS — M0579 Rheumatoid arthritis with rheumatoid factor of multiple sites without organ or systems involvement: Secondary | ICD-10-CM

## 2022-01-15 DIAGNOSIS — L4050 Arthropathic psoriasis, unspecified: Secondary | ICD-10-CM

## 2022-01-15 DIAGNOSIS — L409 Psoriasis, unspecified: Secondary | ICD-10-CM

## 2022-02-04 ENCOUNTER — Other Ambulatory Visit: Payer: Self-pay | Admitting: *Deleted

## 2022-02-04 MED ORDER — ORENCIA CLICKJECT 125 MG/ML ~~LOC~~ SOAJ
125.0000 mg | SUBCUTANEOUS | 0 refills | Status: DC
Start: 2022-02-04 — End: 2022-07-01

## 2022-02-04 NOTE — Telephone Encounter (Signed)
Next Visit: 03/22/2022  Last Visit: 10/19/2021  Last Fill: 11/02/2021  DX: Rheumatoid arthritis involving multiple sites with positive rheumatoid factor    Current Dose per office note 10/19/2021: Orencia 125 mg subcu injections once weekly   Labs: 11/08/2021 Glucose 102, Albumin 3.1, WBC 12.3, Hemoglobin 12.9, RDW 15.9, Neutro Abs 8.8,   TB Gold: CT Chest 09/20/2021  No significant change in moderate to severe pulmonary fibrosis in a pattern with apical to basal gradient, featuring cystic change and bronchiolectasis with associated ground-glass and extensive subpleural sparing about the lung bases. Findings are suggestive of an alternative diagnosis (not UIP) per consensus guidelines: Diagnosis of Idiopathic Pulmonary Fibrosis: An Official ATS/ERS/JRS/ALAT Clinical Practice Guideline. [place], Iss 5, [phone], Nov 02 2016. 2. Diffuse bilateral bronchial wall thickening, consistent with nonspecific infectious or inflammatory bronchitis. 3. Cardiomegaly and coronary artery disease.  Okay to refill Orencia?

## 2022-02-06 ENCOUNTER — Ambulatory Visit: Payer: Medicare HMO | Admitting: Cardiovascular Disease

## 2022-02-07 ENCOUNTER — Other Ambulatory Visit: Payer: Self-pay | Admitting: Cardiovascular Disease

## 2022-02-07 NOTE — Telephone Encounter (Addendum)
Prescription refill request for Eliquis received. Indication: PAF Last office visit:  07/07/20  Adora Fridge MD Scr: 0.64 on 11/08/21 Age: 66 Weight: 86.7kg  Based on above findings Eliquis '5mg'$  twice daily is the appropriate dose.  Pt is past due for appt.  Message sent to schedulers to make appt.  Refill for 60 tablets only.

## 2022-02-11 ENCOUNTER — Other Ambulatory Visit: Payer: Self-pay | Admitting: Physician Assistant

## 2022-02-11 DIAGNOSIS — M0579 Rheumatoid arthritis with rheumatoid factor of multiple sites without organ or systems involvement: Secondary | ICD-10-CM

## 2022-02-11 DIAGNOSIS — L4050 Arthropathic psoriasis, unspecified: Secondary | ICD-10-CM

## 2022-02-11 DIAGNOSIS — L409 Psoriasis, unspecified: Secondary | ICD-10-CM

## 2022-02-11 DIAGNOSIS — Z79899 Other long term (current) drug therapy: Secondary | ICD-10-CM

## 2022-02-11 NOTE — Telephone Encounter (Signed)
Next Visit: 03/22/2022  Last Visit: 10/19/2021  Last Fill: 11/12/2021  DX: Rheumatoid arthritis involving multiple sites with positive rheumatoid factor   Current Dose per office note on 10/19/2021: prednisone 5 mg daily   Okay to refill prednisone?

## 2022-03-08 NOTE — Progress Notes (Signed)
Office Visit Note  Patient: Curtis Flores             Date of Birth: 01-08-1956           MRN: [id number]             PCP: Chesley Noon, MD Referring: Chesley Noon, MD Visit Date: 03/22/2022 Occupation: '@GUAROCC'$ @  Subjective:  Medication monitoring   History of Present Illness: Curtis Flores is a 67 y.o. male with history of seropositive rheumatoid arthritis, psoriatic arthritis, and pulmonary fibrosis.  The patient remains on imuran to 50 mg 3 tablets by mouth daily, prednisone 5 mg daily and orencia 125 mg sq injections once weekly.  He is tolerating combination therapy without any side effects and has not missed any doses recently.  He has occasional increased pain, swelling, and stiffness in his hands and feet.  He takes Tylenol very sparingly for pain relief.  He states that at times he has increased joint pain which she attributes to cooler weather temperatures.  He continues to have some psoriasis on his scalp.  He has been using clobetasol cream topically but continues to have some scaling and itching. He denies any SI joint pain.  He denies any Achilles tendinitis or plantar fasciitis.  He continues to follow-up closely with Dr. Elsworth Soho.  He denies any recent or recurrent infections.    Activities of Daily Living:  Patient reports morning stiffness for several hours.   Patient Reports nocturnal pain.  Difficulty dressing/grooming: Reports Difficulty climbing stairs: Reports Difficulty getting out of chair: Reports Difficulty using hands for taps, buttons, cutlery, and/or writing: Reports  Review of Systems  Constitutional:  Negative for fatigue.  HENT:  Negative for mouth sores and mouth dryness.   Eyes:  Positive for dryness.  Respiratory:  Positive for shortness of breath.   Cardiovascular:  Negative for chest pain and palpitations.  Gastrointestinal:  Negative for blood in stool, constipation and diarrhea.  Endocrine: Negative for increased urination.   Genitourinary:  Negative for involuntary urination.  Musculoskeletal:  Positive for joint pain, gait problem, joint pain, joint swelling, muscle weakness and morning stiffness. Negative for myalgias, muscle tenderness and myalgias.  Skin:  Negative for color change, rash and sensitivity to sunlight.  Allergic/Immunologic: Negative for susceptible to infections.  Neurological:  Negative for dizziness and headaches.  Hematological:  Negative for swollen glands.  Psychiatric/Behavioral:  Negative for depressed mood and sleep disturbance. The patient is not nervous/anxious.     PMFS History:  Patient Active Problem List   Diagnosis Date Noted   Immunocompromised patient ([place]) 04/22/2021   Cataract 03/23/2021   Immunization counseling 03/09/2021   Medication monitoring encounter 12/01/2020   TB lung, 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   Immunodeficiency due to drugs ([place]) 08/09/2020   Atrial fibrillation with RVR ([place]) 05/28/2018   Chronic anticoagulation 05/28/2018   NSTEMI (non-ST elevated myocardial infarction) ([place]) 05/09/2018   Ischemic cardiomyopathy 12/04/2016   Leukocytosis 07/17/2016   Pain in joint involving multiple sites 04/25/2016   Anemia 04/16/2016   Coronary artery disease 04/16/2016   History of atrial fibrillation 04/16/2016   Psoriatic arthritis ([place]) 02/14/2016   High risk medication use 02/14/2016   NICM (nonischemic cardiomyopathy) ([place]) 08/29/2015   Essential hypertension 08/29/2015   Abnormal CT scan, stomach    Hiatal hernia    GERD (gastroesophageal reflux disease) 08/01/2015   Weight loss, unintentional 08/01/2015   Iron deficiency anemia 06/06/2015   Heme positive stool 06/06/2015   PAF (paroxysmal atrial  fibrillation) ([place]) 05/30/2015   CAD S/P percutaneous coronary angioplasty 04/07/2015   Old lateral wall myocardial infarction 04/07/2015   Acute on chronic systolic congestive heart failure ([place]) 04/07/2015   LBBB (left bundle branch block) 04/07/2015    Wide-complex tachycardia 04/07/2015   Atrial flutter ([place]) 04/07/2015   Dyslipidemia, goal LDL below 70 03/07/2015   Psoriasis    Rheumatoid arthritis ([place])    Pulmonary fibrosis ([place]) 03/21/2011    Past Medical History:  Diagnosis Date   Abnormal CT scan, stomach    Chronic systolic dysfunction of left ventricle    Coronary artery disease    lateral STEMI 02/22/15 3/10 cutting balloon to ISR of the o/pLCX   GERD (gastroesophageal reflux disease)    Hyperlipidemia    Hypertension    Ischemic cardiomyopathy    LBBB (left bundle branch block)    Leukocytosis    followed by hematology, reactive   Lymphadenopathy    Myocardial infarction ([place]) 02/2015   Psoriasis 2003   Psoriatic arthritis ([place])    Pulmonary fibrosis ([place])    Rheumatoid arthritis(714.0) 2012   Typical atrial flutter ([place])     Family History  Problem Relation Age of Onset   Hypertension Mother    Colon cancer Neg Hx    Past Surgical History:  Procedure Laterality Date   CARDIAC CATHETERIZATION N/A 02/22/2015   Procedure: Left Heart Cath and Coronary Angiography;  Surgeon: Lorretta Harp, MD;  Location: [place] [facility];  Service: Cardiovascular;  Laterality: N/A;   CARDIAC CATHETERIZATION N/A 02/22/2015   Procedure: Coronary Stent Intervention;  Surgeon: Lorretta Harp, MD;  Location: [place] [facility];  Service: Cardiovascular;  Laterality: N/A;   CARDIOVERSION N/A 04/12/2015   Procedure: CARDIOVERSION;  Surgeon: Larey Dresser, MD;  Location: [facility] ENDOSCOPY;  Service: Cardiovascular;  Laterality: N/A;   COLONOSCOPY  07/01/2003   KDX:IPJASN colonic mucosa except for the proximal right colon in the area of ICV which was not seen completely due to inadequate bowel prep. followed with ACBE which was normal.    COLONOSCOPY N/A 08/24/2015   Dr. Gala Romney: Normal colon. Next colonoscopy in 10 years.   CORONARY BALLOON ANGIOPLASTY N/A 05/12/2018   Procedure: CORONARY BALLOON ANGIOPLASTY;  Surgeon: Belva Crome, MD;   Location: [place] [facility] LAB;  Service: Cardiovascular;  Laterality: N/A;   ELECTROPHYSIOLOGIC STUDY N/A 05/30/2015   Atrial fibrillation ablation by Dr Rayann Heman   ESOPHAGOGASTRODUODENOSCOPY N/A 08/24/2015   Dr. Gala Romney: Medium-sized hiatal hernia, erosive gastropathy. Cameron lesions. Esophageal mucosa distally suggestive of short segment Barrett's esophagus. Not confirmed on biopsy. Gastric biopsy with minimal chronic inflammation   GIVENS CAPSULE STUDY N/A 04/17/2016   Procedure: GIVENS CAPSULE STUDY;  Surgeon: Daneil Dolin, MD;  [facility];  Service: Endoscopy;  Laterality: N/A;  Pt to arrive at 8:00 am for 8:30 am appt   LEFT HEART CATH AND CORONARY ANGIOGRAPHY N/A 05/11/2018   Procedure: LEFT HEART CATH AND CORONARY ANGIOGRAPHY;  Surgeon: Belva Crome, MD;  Location: [place] [facility] LAB;  Service: Cardiovascular;  Laterality: N/A;   TEE WITHOUT CARDIOVERSION N/A 04/12/2015   Procedure: TRANSESOPHAGEAL ECHOCARDIOGRAM (TEE);  Surgeon: Larey Dresser, MD;  Location: [facility] ENDOSCOPY;  Service: Cardiovascular;  Laterality: N/A;   Social History   Social History Narrative   Not on file   Immunization History  Administered Date(s) Administered   Fluad Quad(high Dose 65+) 12/09/2018, 11/01/2020   Influenza Split 12/22/2010   Influenza Whole 01/18/2010   Influenza, High  Dose Seasonal PF 11/11/2017, 12/02/2018   Influenza, Seasonal, Injecte, Preservative Fre 12/03/2014, 12/13/2015, 12/03/2017   Influenza,inj,Quad PF,6+ Mos 11/29/2019   Influenza-Unspecified 12/03/2014, 12/13/2015, 11/12/2016   Moderna Sars-Covid-2 Vaccination 05/08/2019, 06/09/2019, 01/29/2020, 08/09/2020   PFIZER(Purple Top)SARS-COV-2 Vaccination 10/23/2020   Pneumococcal Conjugate-13 08/20/2016, 04/07/2018   Pneumococcal Polysaccharide-23 01/22/2011, 03/05/2011, 03/09/2021   Pneumococcal-Unspecified 01/22/2011, 03/05/2011     Objective: Vital Signs: BP (!) 150/77 (BP Location: Left Arm, Patient Position: Sitting,  Cuff Size: Normal)   Pulse (!) 48   Resp 17   Ht '5\' 6"'$  (1.676 m)   Wt 190 lb 12.8 oz (86.5 kg)   BMI 30.80 kg/m    Physical Exam Vitals and nursing note reviewed.  Constitutional:      Appearance: He is well-developed.  HENT:     Head: Normocephalic and atraumatic.  Eyes:     Conjunctiva/sclera: Conjunctivae normal.     Pupils: Pupils are equal, round, and reactive to light.  Cardiovascular:     Rate and Rhythm: Normal rate and regular rhythm.     Heart sounds: Normal heart sounds.  Pulmonary:     Effort: Pulmonary effort is normal.     Breath sounds: Normal breath sounds.  Abdominal:     General: Bowel sounds are normal.     Palpations: Abdomen is soft.  Musculoskeletal:     Cervical back: Normal range of motion and neck supple.  Skin:    General: Skin is warm and dry.     Capillary Refill: Capillary refill takes less than 2 seconds.  Neurological:     Mental Status: He is alert and oriented to person, place, and time.  Psychiatric:        Behavior: Behavior normal.      Musculoskeletal Exam: C-spine has slightly limited ROM with lateral rotation.  Shoulder joints, elbow joints, wrist joints, MCPs, PIPs, and DIPs good ROM with no synovitis.  Thickening of all MCP joints.  PIP and DIP thickening.  Chronic synovitis of bilateral 2nd, 2rd, and 5th MCP joints.  Complete fist formation today.  Hip joints have good ROM with no groin pain.  Knee joints good ROM with no discomfort.  Ankle joint have good ROM with no tenderness or swelling.  No evidence of achilles tendonitis or plantar fasciitis. Overcrowding of toes.    CDAI Exam: CDAI Score: -- Patient Global: --; Provider Global: -- Swollen: --; Tender: -- Joint Exam 03/22/2022   No joint exam has been documented for this visit   There is currently no information documented on the homunculus. Go to the Rheumatology activity and complete the homunculus joint exam.  Investigation: No additional findings.  Imaging: No  results found.  Recent Labs: Lab Results  Component Value Date   WBC 14.9 (H) 03/19/2022   HGB 13.0 03/19/2022   PLT 270 03/19/2022   NA 137 03/19/2022   K 4.5 03/19/2022   CL 101 03/19/2022   CO2 27 03/19/2022   GLUCOSE 109 (H) 03/19/2022   BUN 13 03/19/2022   CREATININE 0.77 03/19/2022   BILITOT 0.4 03/19/2022   ALKPHOS 93 03/19/2022   AST 28 03/19/2022   ALT 23 03/19/2022   PROT 6.8 03/19/2022   ALBUMIN 2.9 (L) 03/19/2022   CALCIUM 8.6 (L) 03/19/2022   GFRAA 114 03/14/2020   QFTBGOLDPLUS Positive (A) 08/02/2020    Speciality Comments: No specialty comments available.  Procedures:  No procedures performed Allergies: Patient has no known allergies.     Assessment / Plan:  Visit Diagnoses: Rheumatoid arthritis involving multiple sites with positive rheumatoid factor (HCC) - Severe disease: Thickening of all MCP, PIP, and DIP joints.  Chronic synovitis in bilateral second, third, and fifth MCP joints.  He continues to experience intermittent arthralgias and joint stiffness including both hands and both feet.  He has chronic synovitis in bilateral second, third, and fifth MCP joints.  He remains on Orencia 125 mg subcutaneous injections once weekly and Imuran 50 mg 3 tablets by mouth daily.  The dose of Imuran was increased from 2 tablets to 3 tablets after his last office visit on 10/19/2021.  He has noticed some improvement in his joint pain and stiffness since increasing the dose of Imuran.  He remains on prednisone 5 mg daily.  He has been taking Tylenol very sparingly for pain relief.  He will remain on the current treatment regimen.  He was advised to notify us if he develops more frequent or severe flares.  He will follow-up in the office in 5 months or sooner if needed.  Psoriatic arthritis [facility]): No evidence of Achilles tendinitis or plantar fasciitis.  No SI joint tenderness upon palpation.  Most of his discomfort seems to be due to underlying rheumatoid arthritis at  this time.  He has some tenderness over the MCP joints.  He remains on Orencia and Imuran as prescribed.  He is taking prednisone 5 mg daily.  Psoriasis: Patient continues to have psoriasis on his scalp.  He has been using clobetasol 0.05% external solution and/or cream topically.  Patient was encouraged to follow back up with his dermatologist for further recommendations.  High risk medication use - Imuran 50 mg 3 tablets by mouth daily, prednisone 5 mg daily and orencia 125 mg sq injections once weekly. CBC and CMP updated on 03/19/22.  He will be due to update lab work in April and every 3 months. No recent or recurrent infections.  Discussed the importance of holding imuran and orencia if he develops signs or symptoms of an infection and to resume once the infection has completely cleared.  Patient will require yearly chest x-ray given history of latent TB.  Completed treatment under the care of Dr. Gale Journey.   Pulmonary fibrosis [facility]) - PFTs updated 05/16/2020.  Followed by Dr. Elsworth Soho.  High resolution chest CT updated on 09/20/21:  No significant change in moderate to severe pulmonary fibrosis in a pattern with apical to basal gradient, featuring cystic change and bronchiolectasis with associated ground-glass and extensive subpleural sparing about the lung bases.  Upcoming appointment scheduled with Dr. Elsworth Soho on 03/29/2022.  Positive QuantiFERON-TB Gold test - Dr. Gale Journey who initiated INH/B6.  He has completed therapy for latent TB and was cleared to resume imuran as prescribed.  He will require yearly chest x-rays.  Osteopenia of multiple sites - DEXA updated on 04/28/2020: FRAX not reported because of the T-scores for spine total, hip total, femoral neck at or above -1.0.  Due to update DEXA in February 2024.  Future order for DEXA placed today.    Other medical conditions are listed as follows:   History of congestive heart failure - Dr. [name]  History of hyperlipidemia  History of  bronchiectasis  History of gastroesophageal reflux (GERD)  History of coronary artery disease  History of atrial fibrillation  History of hypertension: BP was 150/77 today in the office.  Blood pressure was rechecked prior to the patient leaving.  Advised patient to monitor blood pressure closely.  Orders: No orders of the defined  types were placed in this encounter.  No orders of the defined types were placed in this encounter.    Follow-Up Instructions: Return in about 5 months (around 08/21/2022) for Rheumatoid arthritis, Psoriatic arthritis.   Ofilia Neas, PA-C  Note - This record has been created using Dragon software.  Chart creation errors have been sought, but may not always  have been located. Such creation errors do not reflect on  the standard of medical care.

## 2022-03-15 ENCOUNTER — Ambulatory Visit: Payer: Medicare HMO | Admitting: Rheumatology

## 2022-03-15 ENCOUNTER — Encounter: Payer: Self-pay | Admitting: Cardiovascular Disease

## 2022-03-15 ENCOUNTER — Ambulatory Visit: Payer: Medicare HMO | Attending: Cardiovascular Disease | Admitting: Cardiovascular Disease

## 2022-03-15 VITALS — BP 142/60 | HR 48 | Ht 66.0 in | Wt 189.8 lb

## 2022-03-15 DIAGNOSIS — I480 Paroxysmal atrial fibrillation: Secondary | ICD-10-CM

## 2022-03-15 DIAGNOSIS — E785 Hyperlipidemia, unspecified: Secondary | ICD-10-CM | POA: Diagnosis not present

## 2022-03-15 DIAGNOSIS — R0989 Other specified symptoms and signs involving the circulatory and respiratory systems: Secondary | ICD-10-CM

## 2022-03-15 DIAGNOSIS — I5023 Acute on chronic systolic (congestive) heart failure: Secondary | ICD-10-CM

## 2022-03-15 DIAGNOSIS — Z9861 Coronary angioplasty status: Secondary | ICD-10-CM

## 2022-03-15 DIAGNOSIS — I447 Left bundle-branch block, unspecified: Secondary | ICD-10-CM

## 2022-03-15 DIAGNOSIS — I2510 Atherosclerotic heart disease of native coronary artery without angina pectoris: Secondary | ICD-10-CM

## 2022-03-15 DIAGNOSIS — I10 Essential (primary) hypertension: Secondary | ICD-10-CM

## 2022-03-15 NOTE — Progress Notes (Signed)
03/15/2022 Curtis Flores   [birth year]  [id number]  Primary Physician Curtis Noon, MD Primary Cardiologist: Curtis Harp MD Curtis Flores, [place]  HPI:  Curtis Flores is a 67 y.o.  moderately overweight, married African-American male father of 2, grandfather to 3 grandchildren accompanied by his wife Curtis Flores today. I last saw him in the office 07/07/2020.Marland KitchenHis primary care physician is Dr. Edrick Flores in medicine. I recently saw him in the office 03/07/15. He has a remote history of tobacco abuse as well as a history of hypertension and hyperlipidemia rheumatoid arthritis. He had an inferolateral STEMI on 02/22/15. I took him to the Cath Lab and stented his proximal dominant circumflex and first obtuse marginal branch with drug-eluting stents (T stenting). He had residual 90% stenosis and a mid nondominant RCA 80% proximal LAD stenosis. His EF was 35-40% by 2-D echo. Subsequent Myoview stress testing performed soon thereafter was nonischemic.[name] There is a history of pulmonary fibrosis however as well. When I saw him in the office on 04/05/15 he was significantly dyspneic and tachycardic. I initially thought that this was sinus tachycardia however he was admitted to the hospital 2 days later with progressive dyspnea and he was diagnosed with atrial flutter. He did have a wide complex tachycardia prior to that which converted with venous adenosine. He UNDERWENT transesophageal echo/DC cardioversion successfully was has resulted in marked improvement in his symptoms. His EF at that time was 20%. He was put on oral anticoagulations and Brilenta was changed to Plavix. He also underwent a flutter ablation by Dr. Rayann Flores on 05/30/15 and was taken off his amiodarone.    He was hospitalized 05/09/2018 with non-STEMI.  He underwent cardiac catheterization by Dr. Tamala Flores demonstrating "in-stent restenosis within the proximal circumflex "T stent" and underwent Cutting Balloon atherectomy.  He did develop a flutter prior  to his hospitalization but converted to sinus rhythm and was begun on sotalol by Dr. Rayann Flores.  He continues to maintain sinus rhythm/sinus bradycardia on Eliquis oral anticoagulation.  He does have pulmonary fibrosis followed by Dr. Elsworth Flores.   Since I saw him a year and a half ago he continues to remain stable.  He still sees Dr. Elsworth Flores for his pulmonary fibrosis.  He denies chest pain or shortness of breath.   Current Meds  Medication Sig   Abatacept (ORENCIA CLICKJECT) 962 MG/ML SOAJ Inject 125 mg into the skin once a week.   amLODipine (NORVASC) 5 MG tablet TAKE 1 TABLET EVERY DAY (SCHEDULE OFFICE VISIT FOR FUTURE REFILLS)   apixaban (ELIQUIS) 5 MG TABS tablet TAKE 1 TABLET TWICE DAILY (NEED CARDIOLOGY APPOINTMENT FOR ELIQUIS REFILLS, PLEASE CALL OFFICE)   atorvastatin (LIPITOR) 80 MG tablet TAKE 1 TABLET EVERY DAY AT 6 PM   azaTHIOprine (IMURAN) 50 MG tablet Take 3 tablets (150 mg total) by mouth daily.   clobetasol (TEMOVATE) 0.05 % external solution Apply 1 Application topically 2 (two) times daily.   Clobetasol Prop Emollient Base 0.05 % emollient cream APPLY TO AFFECTED AREA TWICE A DAY AS NEEDED   clopidogrel (PLAVIX) 75 MG tablet TAKE 1 TABLET EVERY DAY (SCHEDULE OFFICE VISIT FOR FUTURE REFILLS)   furosemide (LASIX) 40 MG tablet TAKE 1 TABLET TWICE DAILY   isoniazid (NYDRAZID) 300 MG tablet Take 1 tablet (300 mg total) by mouth daily.   losartan (COZAAR) 100 MG tablet TAKE 1 TABLET (100 MG TOTAL) BY MOUTH DAILY.   pantoprazole (PROTONIX) 40 MG tablet TAKE 1 TABLET TWICE DAILY   potassium  chloride SA (KLOR-CON M) 20 MEQ tablet TAKE 2 TABLETS TWICE DAILY   predniSONE (DELTASONE) 5 MG tablet TAKE 1 TABLET BY MOUTH EVERY DAY WITH BREAKFAST   pyridOXINE (VITAMIN B-6) 50 MG tablet Take 1 tablet (50 mg total) by mouth daily.   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   Drug use: No    Comment: H/O marijuana use many years ago.   Sexual activity: Yes    Birth control/protection: None  Other Topics Concern   Not on file  Social History Narrative   Not on file   Social Determinants of Health   Financial Resource Strain: Not on file  Food Insecurity: Not on file  Transportation Needs: Not on file  Physical Activity: Not on file  Stress: Not on file  Social Connections: Not on file  Intimate Partner Violence: Not on file     Review of Systems: General: negative for chills, fever, night sweats or weight changes.  Cardiovascular: negative for chest pain, dyspnea on exertion, edema, orthopnea, palpitations, paroxysmal nocturnal dyspnea or shortness of breath Dermatological: negative for rash Respiratory: negative for cough or wheezing Urologic: negative for hematuria Abdominal: negative for nausea, vomiting, diarrhea, bright red blood per rectum, melena, or hematemesis Neurologic: negative for visual changes, syncope, or dizziness All other systems reviewed and are otherwise negative except as noted above.    Blood pressure (!) 142/60, pulse (!) 48, height '5\' 6"'$  (1.676 m), weight 189 lb 12.8 oz (86.1 kg), SpO2 95 %.  General appearance: alert and no  distress Neck: no adenopathy, no JVD, supple, symmetrical, trachea midline, thyroid not enlarged, symmetric, no tenderness/mass/nodules, and left carotid bruit Lungs: clear to auscultation bilaterally Heart: regular rate and rhythm, S1, S2 normal, no murmur, click, rub or gallop Extremities: extremities normal, atraumatic, no cyanosis or edema Pulses: 2+ and symmetric Skin: Skin color, texture, turgor normal. No rashes or lesions Neurologic: Grossly normal  EKG sinus bradycardia at 48 with left bundle branch block.  Personally reviewed this EKG.  ASSESSMENT AND PLAN:   Dyslipidemia, goal LDL below 70 History of dyslipidemia on statin therapy lipid profile performed 07/19/2020 revealing total cholesterol of 140, LDL of 76 and HDL 49.  CAD S/P percutaneous coronary angioplasty History of CAD status post inferolateral STEMI 02/22/2015.  I took him to the Cath Lab revealing disease in his proximal dominant circumflex and first obtuse marginal branch which I stented using "T stenting technique with drug-eluting stents.  He did  have residual 90% stenosis in the mid nondominant RCA.  He did well after that.  He had Leeya Rusconi intervention by Dr. Tamala Flores in the setting of non-STEMI 05/09/2018 with Cutting Balloon atherectomy of his circumflex stent.  He currently denies chest pain.  LBBB (left bundle branch block) Chronic  PAF (paroxysmal atrial fibrillation) (HCC) History of PAF maintaining sinus rhythm on Eliquis and sotalol.  Essential hypertension History of essential hypertension blood pressure measured today at 142/60.  He is on amlodipine and losartan.  Acute on chronic systolic congestive heart failure (HCC) History of LV dysfunction with an EF in the 40 to 45% range by 2D echo performed 05/09/2018.  He denies shortness of breath other than from his interstitial pulmonary fibrosis.  He is on appropriate medications.     Curtis Harp MD FACP,FACC,FAHA, [facility] 03/15/2022 10:49 AM

## 2022-03-15 NOTE — Assessment & Plan Note (Signed)
History of LV dysfunction with an EF in the 40 to 45% range by 2D echo performed 05/09/2018.  He denies shortness of breath other than from his interstitial pulmonary fibrosis.  He is on appropriate medications.

## 2022-03-15 NOTE — Assessment & Plan Note (Signed)
History of essential hypertension blood pressure measured today at 142/60.  He is on amlodipine and losartan.

## 2022-03-15 NOTE — Patient Instructions (Signed)
Medication Instructions:  No Changes In Medications at this time.  *If you need a refill on your cardiac medications before your next appointment, please call your pharmacy*  Lab Work: None Ordered At This Time.  If you have labs (blood work) drawn today and your tests are completely normal, you will receive your results only by: [place] (if you have MyChart) OR A paper copy in the mail If you have any lab test that is abnormal or we need to change your treatment, we will call you to review the results.  Testing/Procedures: Your physician has requested that you have an echocardiogram. Echocardiography is a painless test that uses sound waves to create images of your heart. It provides your doctor with information about the size and shape of your heart and how well your heart's chambers and valves are working. You may receive an ultrasound enhancing agent through an IV if needed to better visualize your heart during the echo.This procedure takes approximately one hour. There are no restrictions for this procedure. This will take place at the [address].   Your physician has requested that you have a carotid duplex. This test is an ultrasound of the carotid arteries in your neck. It looks at blood flow through these arteries that supply the brain with blood. Allow one hour for this exam. There are no restrictions or special instructions.   Follow-Up: At [facility], you and your health needs are our priority.  As part of our continuing mission to provide you with exceptional heart care, we have created designated Provider Care Teams.  These Care Teams include your primary Cardiologist (physician) and Advanced Practice Providers (APPs -  Physician Assistants and Nurse Practitioners) who all work together to provide you with the care you need, when you need it.  Your next appointment:   1 year(s)  Provider:   Quay Burow, MD

## 2022-03-15 NOTE — Assessment & Plan Note (Signed)
History of dyslipidemia on statin therapy lipid profile performed 07/19/2020 revealing total cholesterol of 140, LDL of 76 and HDL 49.

## 2022-03-15 NOTE — Assessment & Plan Note (Signed)
Chronic. 

## 2022-03-15 NOTE — Assessment & Plan Note (Signed)
History of PAF maintaining sinus rhythm on Eliquis and sotalol.

## 2022-03-15 NOTE — Assessment & Plan Note (Signed)
History of CAD status post inferolateral STEMI 02/22/2015.  I took him to the Cath Lab revealing disease in his proximal dominant circumflex and first obtuse marginal branch which I stented using "T stenting technique with drug-eluting stents.  He did have residual 90% stenosis in the mid nondominant RCA.  He did well after that.  He had Tashiba Timoney intervention by Dr. Tamala Julian in the setting of non-STEMI 05/09/2018 with Cutting Balloon atherectomy of his circumflex stent.  He currently denies chest pain.

## 2022-03-18 ENCOUNTER — Other Ambulatory Visit: Payer: Self-pay

## 2022-03-18 DIAGNOSIS — Z79899 Other long term (current) drug therapy: Secondary | ICD-10-CM

## 2022-03-19 ENCOUNTER — Inpatient Hospital Stay: Payer: Medicare HMO | Attending: Hematology

## 2022-03-19 ENCOUNTER — Other Ambulatory Visit (HOSPITAL_COMMUNITY)
Admission: RE | Admit: 2022-03-19 | Discharge: 2022-03-19 | Disposition: A | Discharge status: Home / Self Care | Payer: Medicare HMO | Source: Ambulatory Visit | Attending: Physician Assistant | Admitting: Physician Assistant

## 2022-03-19 DIAGNOSIS — Z8249 Family history of ischemic heart disease and other diseases of the circulatory system: Secondary | ICD-10-CM | POA: Diagnosis not present

## 2022-03-19 DIAGNOSIS — I4891 Unspecified atrial fibrillation: Secondary | ICD-10-CM | POA: Diagnosis not present

## 2022-03-19 DIAGNOSIS — I252 Old myocardial infarction: Secondary | ICD-10-CM | POA: Insufficient documentation

## 2022-03-19 DIAGNOSIS — I10 Essential (primary) hypertension: Secondary | ICD-10-CM | POA: Diagnosis not present

## 2022-03-19 DIAGNOSIS — I2510 Atherosclerotic heart disease of native coronary artery without angina pectoris: Secondary | ICD-10-CM | POA: Insufficient documentation

## 2022-03-19 DIAGNOSIS — Z79899 Other long term (current) drug therapy: Secondary | ICD-10-CM

## 2022-03-19 DIAGNOSIS — Z7901 Long term (current) use of anticoagulants: Secondary | ICD-10-CM | POA: Diagnosis not present

## 2022-03-19 DIAGNOSIS — M069 Rheumatoid arthritis, unspecified: Secondary | ICD-10-CM | POA: Insufficient documentation

## 2022-03-19 DIAGNOSIS — L4050 Arthropathic psoriasis, unspecified: Secondary | ICD-10-CM | POA: Insufficient documentation

## 2022-03-19 DIAGNOSIS — D72829 Elevated white blood cell count, unspecified: Secondary | ICD-10-CM | POA: Diagnosis present

## 2022-03-19 DIAGNOSIS — Z7952 Long term (current) use of systemic steroids: Secondary | ICD-10-CM | POA: Insufficient documentation

## 2022-03-19 DIAGNOSIS — Z79624 Long term (current) use of inhibitors of nucleotide synthesis: Secondary | ICD-10-CM | POA: Insufficient documentation

## 2022-03-19 DIAGNOSIS — Z7902 Long term (current) use of antithrombotics/antiplatelets: Secondary | ICD-10-CM | POA: Insufficient documentation

## 2022-03-19 LAB — COMPREHENSIVE METABOLIC PANEL
ALT: 23 U/L (ref 0–44)
AST: 28 U/L (ref 15–41)
Albumin: 2.9 g/dL — ABNORMAL LOW (ref 3.5–5.0)
Alkaline Phosphatase: 93 U/L (ref 38–126)
Anion gap: 9 (ref 5–15)
BUN: 13 mg/dL (ref 8–23)
CO2: 27 mmol/L (ref 22–32)
Calcium: 8.6 mg/dL — ABNORMAL LOW (ref 8.9–10.3)
Chloride: 101 mmol/L (ref 98–111)
Creatinine, Ser: 0.77 mg/dL (ref 0.61–1.24)
GFR, Estimated: >60 mL/min (ref >60)
Glucose, Bld: 109 mg/dL — ABNORMAL HIGH (ref 70–99)
Potassium: 4.5 mmol/L (ref 3.5–5.1)
Sodium: 137 mmol/L (ref 135–145)
Total Bilirubin: 0.4 mg/dL (ref 0.3–1.2)
Total Protein: 6.8 g/dL (ref 6.5–8.1)

## 2022-03-19 LAB — CBC WITH DIFFERENTIAL/PLATELET
Abs Immature Granulocytes: 0.08 K/uL — ABNORMAL HIGH (ref 0.00–0.07)
Basophils Absolute: 0.1 K/uL (ref 0.0–0.1)
Basophils Relative: 0 %
Eosinophils Absolute: 0.1 K/uL (ref 0.0–0.5)
Eosinophils Relative: 1 %
HCT: 41.3 % (ref 39.0–52.0)
Hemoglobin: 13 g/dL (ref 13.0–17.0)
Immature Granulocytes: 1 %
Lymphocytes Relative: 15 %
Lymphs Abs: 2.2 K/uL (ref 0.7–4.0)
MCH: 27.3 pg (ref 26.0–34.0)
MCHC: 31.5 g/dL (ref 30.0–36.0)
MCV: 86.6 fL (ref 80.0–100.0)
Monocytes Absolute: 1.1 K/uL — ABNORMAL HIGH (ref 0.1–1.0)
Monocytes Relative: 8 %
Neutro Abs: 11.3 K/uL — ABNORMAL HIGH (ref 1.7–7.7)
Neutrophils Relative %: 75 %
Platelets: 270 K/uL (ref 150–400)
RBC: 4.77 MIL/uL (ref 4.22–5.81)
RDW: 16.2 % — ABNORMAL HIGH (ref 11.5–15.5)
WBC: 14.9 K/uL — ABNORMAL HIGH (ref 4.0–10.5)
nRBC: 0 % (ref 0.0–0.2)

## 2022-03-19 LAB — LACTATE DEHYDROGENASE: LDH: 151 U/L (ref 98–192)

## 2022-03-22 ENCOUNTER — Encounter: Payer: Self-pay | Admitting: Physician Assistant

## 2022-03-22 ENCOUNTER — Ambulatory Visit: Payer: Medicare HMO | Attending: Rheumatology | Admitting: Physician Assistant

## 2022-03-22 VITALS — BP 150/77 | HR 48 | Resp 17 | Ht 66.0 in | Wt 190.8 lb

## 2022-03-22 DIAGNOSIS — L409 Psoriasis, unspecified: Secondary | ICD-10-CM

## 2022-03-22 DIAGNOSIS — Z8719 Personal history of other diseases of the digestive system: Secondary | ICD-10-CM

## 2022-03-22 DIAGNOSIS — M0579 Rheumatoid arthritis with rheumatoid factor of multiple sites without organ or systems involvement: Secondary | ICD-10-CM

## 2022-03-22 DIAGNOSIS — Z8639 Personal history of other endocrine, nutritional and metabolic disease: Secondary | ICD-10-CM

## 2022-03-22 DIAGNOSIS — Z79899 Other long term (current) drug therapy: Secondary | ICD-10-CM | POA: Diagnosis not present

## 2022-03-22 DIAGNOSIS — L4050 Arthropathic psoriasis, unspecified: Secondary | ICD-10-CM

## 2022-03-22 DIAGNOSIS — M8589 Other specified disorders of bone density and structure, multiple sites: Secondary | ICD-10-CM

## 2022-03-22 DIAGNOSIS — J8410 Pulmonary fibrosis, unspecified: Secondary | ICD-10-CM

## 2022-03-22 DIAGNOSIS — Z8679 Personal history of other diseases of the circulatory system: Secondary | ICD-10-CM

## 2022-03-22 DIAGNOSIS — R7612 Nonspecific reaction to cell mediated immunity measurement of gamma interferon antigen response without active tuberculosis: Secondary | ICD-10-CM

## 2022-03-22 DIAGNOSIS — Z8709 Personal history of other diseases of the respiratory system: Secondary | ICD-10-CM

## 2022-03-22 NOTE — Patient Instructions (Signed)
Standing Labs We placed an order today for your standing lab work.   Please have your standing labs drawn in April and every 3 months   Please have your labs drawn 2 weeks prior to your appointment so that the provider can discuss your lab results at your appointment.  Please note that you may see your imaging and lab results in MyChart before we have reviewed them. We will contact you once all results are reviewed. Please allow our office up to 72 hours to thoroughly review all of the results before contacting the office for clarification of your results.  Lab hours are:   Monday through Thursday from 8:00 am -12:30 pm and 1:00 pm-5:00 pm and Friday from 8:00 am-12:00 pm.  Please be advised, all patients with office appointments requiring lab work will take precedent over walk-in lab work.   Labs are drawn by [facility]. Please bring your co-pay at the time of your lab draw.  You may receive a bill from [facility] for your lab work.  Please note if you are on Hydroxychloroquine and and an order has been placed for a Hydroxychloroquine level, you will need to have it drawn 4 hours or more after your last dose.  If you wish to have your labs drawn at another location, please call the office 24 hours in advance so we can fax the orders.  The office is located at [address], [address], [place], [place] [address] No appointment is necessary.    If you have any questions regarding directions or hours of operation,  please call [phone].   As a reminder, please drink plenty of water prior to coming for your lab work. Thanks!  

## 2022-03-26 ENCOUNTER — Inpatient Hospital Stay (HOSPITAL_BASED_OUTPATIENT_CLINIC_OR_DEPARTMENT_OTHER): Payer: Medicare HMO | Admitting: Hematology

## 2022-03-26 ENCOUNTER — Encounter: Payer: Self-pay | Admitting: Hematology

## 2022-03-26 VITALS — BP 137/52 | HR 46 | Temp 98.0°F | Resp 18 | Wt 192.1 lb

## 2022-03-26 DIAGNOSIS — D72829 Elevated white blood cell count, unspecified: Secondary | ICD-10-CM | POA: Diagnosis not present

## 2022-03-26 NOTE — Patient Instructions (Signed)
[place] at [facility] Discharge Instructions   You were seen and examined today by Dr. Delton Coombes.  He reviewed the results of your lab work which are normal/stable.   Return as scheduled in one year.    Thank you for choosing [place] at [facility] to provide your oncology and hematology care.  To afford each patient quality time with our provider, please arrive at least 15 minutes before your scheduled appointment time.   If you have a lab appointment with the [facility] please come in thru the Main Entrance and check in at the main information desk.  You need to re-schedule your appointment should you arrive 10 or more minutes late.  We strive to give you quality time with our providers, and arriving late affects you and other patients whose appointments are after yours.  Also, if you no show three or more times for appointments you may be dismissed from the clinic at the providers discretion.     Again, thank you for choosing [facility].  Our hope is that these requests will decrease the amount of time that you wait before being seen by our physicians.       _____________________________________________________________  Should you have questions after your visit to [facility], please contact our office at [phone] and follow the prompts.  Our office hours are 8:00 a.m. and 4:30 p.m. Monday - Friday.  Please note that voicemails left after 4:00 p.m. may not be returned until the following business day.  We are closed weekends and major holidays.  You do have access to a nurse 24-7, just call the main number to the clinic [phone] and do not press any options, hold on the line and a nurse will answer the phone.    For prescription refill requests, have your pharmacy contact our office and allow 72 hours.    Due to Covid, you will need to wear a mask upon entering the hospital. If you do not have a  mask, a mask will be given to you at the Main Entrance upon arrival. For doctor visits, patients may have 1 support person age 78 or older with them. For treatment visits, patients can not have anyone with them due to social distancing guidelines and our immunocompromised population.

## 2022-03-26 NOTE — Telephone Encounter (Signed)
Patient's wife states that they submitted there portion of BMS renewal application and faxed provider portion to clinic. I advised that we did not receive. Will fax provider portion to BMS with PA approval letter, med list, and insurance card copy  Fax: [phone] Phone: [phone]  Submitted a Prior Authorization request to [facility] for [facility] via CoverMyMeds. Will update once we receive a response.  Key: [id number]  Knox Saliva, PharmD, MPH, BCPS, CPP Clinical Pharmacist (Rheumatology and Pulmonology)

## 2022-03-26 NOTE — Progress Notes (Signed)
[place], [place] [id number]   CLINIC:  Medical Oncology/Hematology  PCP:  Chesley Noon, MD [address] [place] [address]  [phone]  REASON FOR VISIT:  Follow-up for leukocytosis  PRIOR THERAPY: none  CURRENT THERAPY: surveillance  INTERVAL HISTORY:  [name], a 67 y.o. male, seen for follow-up of leukocytosis.  Denies any fevers, night sweats or weight loss in the last 1 year.  Denies any infections in the last 1 year.  Energy levels are 75%.  REVIEW OF SYSTEMS:  Review of Systems  Constitutional:  Negative for appetite change, fatigue, fever and unexpected weight change.  Endocrine: Negative for hot flashes.  All other systems reviewed and are negative.   PAST MEDICAL/SURGICAL HISTORY:  Past Medical History:  Diagnosis Date   Abnormal CT scan, stomach    Chronic systolic dysfunction of left ventricle    Coronary artery disease    lateral STEMI 02/22/15 3/10 cutting balloon to ISR of the o/pLCX   GERD (gastroesophageal reflux disease)    Hyperlipidemia    Hypertension    Ischemic cardiomyopathy    LBBB (left bundle branch block)    Leukocytosis    followed by hematology, reactive   Lymphadenopathy    Myocardial infarction ([place]) 02/2015   Psoriasis 2003   Psoriatic arthritis ([place])    Pulmonary fibrosis ([place])    Rheumatoid arthritis(714.0) 2012   Typical atrial flutter ([place])    Past Surgical History:  Procedure Laterality Date   CARDIAC CATHETERIZATION N/A 02/22/2015   Procedure: Left Heart Cath and Coronary Angiography;  Surgeon: Lorretta Harp, MD;  Location: [place] [facility];  Service: Cardiovascular;  Laterality: N/A;   CARDIAC CATHETERIZATION N/A 02/22/2015   Procedure: Coronary Stent Intervention;  Surgeon: Lorretta Harp, MD;  Location: [place];  Service: Cardiovascular;  Laterality: N/A;   CARDIOVERSION N/A 04/12/2015   Procedure: CARDIOVERSION;  Surgeon:  Larey Dresser, MD;  Location: [facility] ENDOSCOPY;  Service: Cardiovascular;  Laterality: N/A;   COLONOSCOPY  07/01/2003   LYY:TKPTWS colonic mucosa except for the proximal right colon in the area of ICV which was not seen completely due to inadequate bowel prep. followed with ACBE which was normal.    COLONOSCOPY N/A 08/24/2015   Dr. Gala Romney: Normal colon. Next colonoscopy in 10 years.   CORONARY BALLOON ANGIOPLASTY N/A 05/12/2018   Procedure: CORONARY BALLOON ANGIOPLASTY;  Surgeon: Belva Crome, MD;  Location: [place] [facility] LAB;  Service: Cardiovascular;  Laterality: N/A;   ELECTROPHYSIOLOGIC STUDY N/A 05/30/2015   Atrial fibrillation ablation by Dr Rayann Heman   ESOPHAGOGASTRODUODENOSCOPY N/A 08/24/2015   Dr. Gala Romney: Medium-sized hiatal hernia, erosive gastropathy. Cameron lesions. Esophageal mucosa distally suggestive of short segment Barrett's esophagus. Not confirmed on biopsy. Gastric biopsy with minimal chronic inflammation   GIVENS CAPSULE STUDY N/A 04/17/2016   Procedure: GIVENS CAPSULE STUDY;  Surgeon: Daneil Dolin, MD;  [facility];  Service: Endoscopy;  Laterality: N/A;  Pt to arrive at 8:00 am for 8:30 am appt   LEFT HEART CATH AND CORONARY ANGIOGRAPHY N/A 05/11/2018   Procedure: LEFT HEART CATH AND CORONARY ANGIOGRAPHY;  Surgeon: Belva Crome, MD;  Location: [place] [facility];  Service: Cardiovascular;  Laterality: N/A;   TEE WITHOUT CARDIOVERSION N/A 04/12/2015   Procedure: TRANSESOPHAGEAL ECHOCARDIOGRAM (TEE);  Surgeon: Larey Dresser, MD;  Location: [place];  Service: Cardiovascular;  Laterality: N/A;    SOCIAL HISTORY:  Social History   Socioeconomic History   Marital status: Married    Spouse name: Not on file   Number of children: 2   Years of education: Not on file   Highest education level: Not on file  Occupational History   Occupation: unemployed    Comment: not working do to arthritis; 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   Drug use: No    Comment: H/O marijuana use many years ago.   Sexual activity: Yes    Birth control/protection: None  Other Topics Concern   Not on file  Social History Narrative   Not on file   Social Determinants of Health   Financial Resource Strain: Not on file  Food Insecurity: Not on file  Transportation Needs: Not on file  Physical Activity: Not on file  Stress: Not on file  Social Connections: Not on file  Intimate Partner Violence: Not on file    FAMILY HISTORY:  Family History  Problem Relation Age of Onset   Hypertension Mother    Colon cancer Neg Hx     CURRENT MEDICATIONS:  Current Outpatient Medications  Medication Sig Dispense Refill   Abatacept (ORENCIA CLICKJECT) 326 MG/ML SOAJ Inject 125 mg into the skin once a week. 12 mL 0   amLODipine (NORVASC) 5 MG tablet TAKE 1 TABLET EVERY DAY (SCHEDULE OFFICE VISIT FOR FUTURE REFILLS) 60 tablet 10   apixaban (ELIQUIS) 5 MG TABS tablet TAKE 1 TABLET TWICE DAILY (NEED CARDIOLOGY APPOINTMENT FOR ELIQUIS REFILLS, PLEASE CALL OFFICE) 60 tablet 0   atorvastatin (LIPITOR) 80 MG tablet TAKE 1 TABLET EVERY DAY AT 6 PM 90 tablet 1   azaTHIOprine (IMURAN) 50 MG tablet Take 3 tablets (150 mg total) by mouth daily. 90 tablet 2   clobetasol (TEMOVATE) 0.05 % external solution Apply 1 Application topically 2 (two) times daily. 50 mL 0   Clobetasol Prop Emollient Base 0.05 % emollient cream APPLY TO AFFECTED AREA TWICE A DAY AS NEEDED 30 g 2   clopidogrel (PLAVIX) 75 MG tablet TAKE 1 TABLET EVERY DAY (SCHEDULE  OFFICE VISIT FOR FUTURE REFILLS) 60 tablet 10   furosemide (LASIX) 40 MG tablet TAKE 1 TABLET TWICE DAILY 180 tablet 3   isoniazid (NYDRAZID) 300 MG tablet Take 1 tablet (300 mg total) by mouth daily. 50 tablet 0   losartan (COZAAR) 100 MG tablet TAKE 1 TABLET (100 MG TOTAL) BY MOUTH DAILY. 90 tablet 3   pantoprazole (PROTONIX) 40 MG tablet TAKE 1 TABLET TWICE DAILY 180 tablet 1   potassium chloride SA (KLOR-CON M) 20 MEQ tablet TAKE 2 TABLETS TWICE DAILY 360 tablet 3   predniSONE (DELTASONE) 5 MG tablet TAKE 1 TABLET BY MOUTH EVERY DAY WITH BREAKFAST 90 tablet 0   pyridOXINE (VITAMIN B-6) 50 MG tablet Take 1 tablet (50 mg total) by mouth daily. 50 tablet 0   sotalol (BETAPACE) 120 MG tablet TAKE 1 TABLET  EVERY 12 HOURS 180 tablet 3   No current facility-administered medications for this visit.    ALLERGIES:  No Known Allergies  PHYSICAL EXAM:  Performance status (ECOG): 1 - Symptomatic but completely ambulatory  Vitals:   03/26/22 1118  BP: (!) 137/52  Pulse: (!) 46  Resp: 18  Temp: 98 F (36.7 C)  SpO2: 97%   Wt Readings from Last 3 Encounters:  03/26/22 192 lb 1.6 oz (87.1 kg)  03/22/22 190 lb 12.8 oz (86.5 kg)  03/15/22 189 lb 12.8 oz (86.1 kg)   Physical Exam Vitals reviewed.  Constitutional:      Appearance: Normal appearance.  Cardiovascular:     Rate and Rhythm: Normal rate and regular rhythm.     Pulses: Normal pulses.     Heart sounds: Normal heart sounds.  Pulmonary:     Effort: Pulmonary effort is normal.     Breath sounds: Normal breath sounds.  Abdominal:     Palpations: Abdomen is soft. There is no hepatomegaly, splenomegaly or mass.     Tenderness: There is no abdominal tenderness.  Musculoskeletal:     Right lower leg: No edema.     Left lower leg: No edema.  Lymphadenopathy:     Cervical: No cervical adenopathy.     Right cervical: No superficial cervical adenopathy.    Left cervical: No superficial cervical adenopathy.     Upper Body:      Right upper body: No supraclavicular adenopathy.     Left upper body: No supraclavicular adenopathy.     Lower Body: No right inguinal adenopathy. No left inguinal adenopathy.  Neurological:     General: No focal deficit present.     Mental Status: He is alert and oriented to person, place, and time.  Psychiatric:        Mood and Affect: Mood normal.        Behavior: Behavior normal.     LABORATORY DATA:  I have reviewed the labs as listed.     Latest Ref Rng & Units 03/19/2022   10:41 AM 11/08/2021    1:06 PM 08/28/2021   11:43 AM  CBC  WBC 4.0 - 10.5 K/uL 14.9  12.3  13.8   Hemoglobin 13.0 - 17.0 g/dL 13.0  12.9  13.2   Hematocrit 39.0 - 52.0 % 41.3  40.6  41.5   Platelets 150 - 400 K/uL 270  290  232       Latest Ref Rng & Units 03/19/2022   10:41 AM 11/08/2021    1:06 PM 08/28/2021   11:46 AM  CMP  Glucose 70 - 99 mg/dL 109  102  106   BUN 8 - 23 mg/dL '13  12  13   '$ Creatinine 0.61 - 1.24 mg/dL 0.77  0.64  0.72   Sodium 135 - 145 mmol/L 137  138  136   Potassium 3.5 - 5.1 mmol/L 4.5  4.5  4.4   Chloride 98 - 111 mmol/L 101  104  102   CO2 22 - 32 mmol/L '27  28  26   '$ Calcium 8.9 - 10.3 mg/dL 8.6  9.1  8.8   Total Protein 6.5 - 8.1 g/dL 6.8  7.2  7.0   Total Bilirubin 0.3 - 1.2 mg/dL 0.4  0.8  0.7   Alkaline Phos 38 - 126 U/L 93  84  79   AST 15 - 41 U/L '28  26  23   '$ ALT 0 - 44 U/L 23  23  20       Component Value Date/Time   RBC 4.77 03/19/2022 1041   MCV 86.6 03/19/2022 1041   MCV 82.2 08/12/2011 1256   MCH 27.3 03/19/2022 1041   MCHC 31.5 03/19/2022 1041   RDW 16.2 (H) 03/19/2022 1041   RDW 14.7 (H) 08/12/2011 1256   LYMPHSABS 2.2 03/19/2022 1041   LYMPHSABS 5.5 (H) 08/12/2011 1256   MONOABS 1.1 (H) 03/19/2022 1041   MONOABS 1.6 (H) 08/12/2011 1256   EOSABS 0.1 03/19/2022 1041   EOSABS 0.2 08/12/2011 1256   BASOSABS 0.1 03/19/2022 1041   BASOSABS 0.1 08/12/2011 1256    DIAGNOSTIC IMAGING:  I have independently reviewed the scans and discussed with the  patient. No results found.   ASSESSMENT:  1. Neutrophilic leukocytosis: -He was followed in our clinic for leukocytosis since 2013. -Bone marrow biopsy on 04/10/2011 showed normocellular marrow with trilineage hematopoiesis. Flow cytometry was negative. Lymphocytes with no aberrant phenotype. JAK2 V617F and BCR/ABL was negative. -He is on chronic prednisone which could be contributing to his leukocytosis.    PLAN:  1. Neutrophilic leukocytosis: - No B symptoms or infections in the last 1 year. - Physical exam with no palpable adenopathy or splenomegaly. - Labs from 03/19/2022 shows normal LFTs and creatinine.  White count is elevated at 14.9, predominantly elevated absolute neutrophil count and mild elevation of monocytes.  Platelet count and hemoglobin was normal. - No indication for further testing.  RTC 1 year for follow-up.   2. Rheumatoid arthritis/psoriatic arthritis: - He is currently on prednisone 5 mg daily, Orencia and Imuran.  Fairly well-controlled.  Orders placed this encounter:  No orders of the defined types were placed in this encounter.    Derek Jack, MD [place] [phone]

## 2022-03-26 NOTE — Telephone Encounter (Signed)
Received notification from [facility] regarding a prior authorization for [facility]. Authorization has been APPROVED from 03/26/2022 to 03/04/2023. Approval letter sent to scan center.  Authorization # [id number]  Submitted Patient Assistance RENEWAL Application to BMS for [facility] along with provider portion, PA and med list. Will update patient when we receive a response.  Fax# [phone] Phone# [phone] Case ID: [id number]  Knox Saliva, PharmD, MPH, BCPS, CPP Clinical Pharmacist (Rheumatology and Pulmonology)

## 2022-03-29 ENCOUNTER — Encounter (HOSPITAL_COMMUNITY): Payer: Medicare HMO

## 2022-03-29 ENCOUNTER — Ambulatory Visit (INDEPENDENT_AMBULATORY_CARE_PROVIDER_SITE_OTHER): Payer: Medicare HMO | Admitting: Pulmonary Disease

## 2022-03-29 ENCOUNTER — Encounter: Payer: Self-pay | Admitting: Pulmonary Disease

## 2022-03-29 VITALS — BP 92/64 | HR 52 | Ht 67.0 in | Wt 190.0 lb

## 2022-03-29 DIAGNOSIS — M05741 Rheumatoid arthritis with rheumatoid factor of right hand without organ or systems involvement: Secondary | ICD-10-CM

## 2022-03-29 DIAGNOSIS — J8410 Pulmonary fibrosis, unspecified: Secondary | ICD-10-CM

## 2022-03-29 DIAGNOSIS — M05742 Rheumatoid arthritis with rheumatoid factor of left hand without organ or systems involvement: Secondary | ICD-10-CM | POA: Diagnosis not present

## 2022-03-29 NOTE — Patient Instructions (Signed)
  X High res CT chest  in  6 months  - July 2024

## 2022-03-29 NOTE — Assessment & Plan Note (Signed)
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  This is relatively stable. We will follow-up with annual high-resolution CT chest, next would be due in July.  Hold off on PFTs for this year I discussed RSV vaccination for which she would be eligible

## 2022-03-29 NOTE — Progress Notes (Signed)
   Subjective:    Patient ID: Curtis Flores, male    DOB: 04-10-1955, 67 y.o.   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 Dyspnea is at baseline.  Denies cough, no interim sickness during the winter. His arthritis and stiffness is worse on cold days but now that the weather is warmer he is feeling better. He remains on Orencia, Imuran and low-dose prednisone.  On occasion he will increase from 5 to 10 mg I reviewed rheumatology and hematology consultation for leukocytosis We reviewed previous CT scans  Significant tests/ events reviewed 2012 -  presented with a symmetric polyarthritis of elbows, wrists, hands, neck, shoulders & feet   [facility]aden[facility]  With POS  RA 89 &  CCP 122, ANA neg, HLA B 27 neg, CK 87   PPD neg, hep panel neg     HRCT chest 09/2021 unchanged fibrosis, alternative pattern, and IGIV CT chest  w con 09/2020 no sig change in fibrosis HRCT 12/2019 >> favor fibrotic NSIP vs probable UIP, honeycombing +, minimal progression from 2017, mod hiatal hernia   HRCT 05/2015-  showed ILD/NSIP , slight progression compared from 2013.      PFTs 2013 showed intraprenchymal restricton with FVC around 71% & DLCO 56% c/w ILD, no airway obstruction.   Rpt PFTs 10/2012 show mild drop in FVC to 66%, DLCO preserved     PFT 05/2015 FEV1 70%, ratio 82, FVC 66%, no BD response, DLCO 46%. DLCO has decreased from 2014.    PFT 12/2015 >> FVC 71%, DLCO 45% unc   PFTs 03/2018 show drop in FVC to 53%, TLC to 49%, DLCO  maintained at 41%   PFTs 06/2019 moderate restriction, ratio 83, FVC 53%, DLCO stable at 11.8/28%   PFTs 05/2020 stabl, FVC 1.93/57%, TLC 55%, DLCO 11.50 /48 %   Review of Systems neg for any significant sore throat, dysphagia, itching, sneezing, nasal congestion or excess/ purulent secretions, fever, chills, sweats, unintended wt loss, pleuritic or exertional cp, hempoptysis, orthopnea pnd or change in chronic leg swelling. Also denies presyncope, palpitations, heartburn, abdominal pain, nausea, vomiting, diarrhea or change in bowel or urinary habits, dysuria,hematuria, rash, arthralgias, visual complaints, headache, numbness weakness or ataxia.     Objective:   Physical Exam  Gen. Pleasant, well-nourished, in no distress ENT - no thrush, no pallor/icterus,no post nasal drip Neck: No JVD, no thyromegaly, no carotid bruits Lungs: no use of accessory muscles, no dullness to percussion, clear without rales or rhonchi  Cardiovascular: Rhythm regular, heart sounds  normal, no murmurs or gallops, no peripheral edema Musculoskeletal: Hand joint swelling, no cyanosis or clubbing        Assessment & Plan:

## 2022-03-29 NOTE — Assessment & Plan Note (Signed)
He continues on Orencia, Imuran and low-dose prednisone.  We discussed his immunocompromise status.

## 2022-04-01 ENCOUNTER — Telehealth: Payer: Self-pay | Admitting: Cardiovascular Disease

## 2022-04-01 NOTE — Telephone Encounter (Signed)
*  STAT* If patient is at the pharmacy, call can be transferred to refill team.   1. Which medications need to be refilled? (please list name of each medication and dose if known)  apixaban (ELIQUIS) 5 MG TABS tablet  2. Which pharmacy/location (including street and city if local pharmacy) is medication to be sent to? [id number] [place] [place]  3. Do they need a 30 day or 90 day supply?  [facility] + [facility]  Patient's wife states the patient has enough medication for today and tomorrow. She called [facility] and they informed her that they sent a request to the office and once approved it will still take 7-10 business days for the prescription to ship. Patient's wife is requesting to have a 30 day supply [facility] local [facility] and a 930day supply sent to [facility] if possible.

## 2022-04-02 ENCOUNTER — Other Ambulatory Visit: Payer: Self-pay | Admitting: Cardiovascular Disease

## 2022-04-02 DIAGNOSIS — I480 Paroxysmal atrial fibrillation: Secondary | ICD-10-CM

## 2022-04-02 NOTE — Telephone Encounter (Signed)
Prescription refill request for Eliquis received. Indication: Afib  Last office visit: 03/15/22 Curtis Flores) Scr: 0.77 (03/19/22)  Age: 67 Weight: 86.2kg  Appropriate dose. Refill sent.

## 2022-04-03 MED ORDER — APIXABAN 5 MG PO TABS: 5.0000 mg | ORAL_TABLET | Freq: Two times a day (BID) | ORAL | 0 refills | Status: DC

## 2022-04-03 NOTE — Telephone Encounter (Signed)
Received a fax from  [facility] regarding an approval for [facility] patient assistance from 04/03/2022 to 03/04/2023. Approval letter sent to scan center.  Phone number: [phone]  Knox Saliva, PharmD, MPH, BCPS, CPP Clinical Pharmacist (Rheumatology and Pulmonology)

## 2022-04-03 NOTE — Telephone Encounter (Signed)
Spoke with patient assistance rep for Orencia. His application is currently in process. I was given an estimate of hopefully end of the day for a determination.   Maryan Puls, PharmD PGY-1 [facility] Pharmacy Resident

## 2022-04-03 NOTE — Addendum Note (Signed)
Addended by: Leonidas Romberg on: 04/03/2022 04:17 PM   Modules accepted: Orders

## 2022-04-03 NOTE — Telephone Encounter (Signed)
Refill for Eliquis, 90 day supply was sent to [facility] yesterday, 04/02/22. 30 day supply sent to [facility] to prevent any missed doses.

## 2022-04-05 ENCOUNTER — Encounter (HOSPITAL_COMMUNITY): Payer: Medicare HMO

## 2022-04-09 ENCOUNTER — Ambulatory Visit: Payer: Medicare HMO | Admitting: Pulmonary Disease

## 2022-04-12 ENCOUNTER — Ambulatory Visit (HOSPITAL_COMMUNITY): Payer: Medicare HMO | Attending: Cardiovascular Disease

## 2022-04-12 ENCOUNTER — Ambulatory Visit (HOSPITAL_COMMUNITY)
Admission: RE | Admit: 2022-04-12 | Disposition: A | Discharge status: Home / Self Care | Payer: Medicare HMO | Source: Ambulatory Visit | Attending: Cardiovascular Disease | Admitting: Cardiovascular Disease

## 2022-04-12 DIAGNOSIS — I5023 Acute on chronic systolic (congestive) heart failure: Secondary | ICD-10-CM | POA: Insufficient documentation

## 2022-04-12 LAB — ECHOCARDIOGRAM COMPLETE
Area-P 1/2: 3.97 cm2
S' Lateral: 4.5 cm

## 2022-04-18 ENCOUNTER — Ambulatory Visit: Payer: Medicare HMO | Attending: Cardiovascular Disease

## 2022-04-18 DIAGNOSIS — R0989 Other specified symptoms and signs involving the circulatory and respiratory systems: Secondary | ICD-10-CM

## 2022-05-17 ENCOUNTER — Other Ambulatory Visit: Payer: Self-pay | Admitting: Physician Assistant

## 2022-05-17 ENCOUNTER — Other Ambulatory Visit: Payer: Self-pay | Admitting: *Deleted

## 2022-05-17 DIAGNOSIS — L4050 Arthropathic psoriasis, unspecified: Secondary | ICD-10-CM

## 2022-05-17 DIAGNOSIS — M0579 Rheumatoid arthritis with rheumatoid factor of multiple sites without organ or systems involvement: Secondary | ICD-10-CM

## 2022-05-17 DIAGNOSIS — Z79899 Other long term (current) drug therapy: Secondary | ICD-10-CM

## 2022-05-17 DIAGNOSIS — L409 Psoriasis, unspecified: Secondary | ICD-10-CM

## 2022-05-17 MED ORDER — PREDNISONE 5 MG PO TABS: ORAL_TABLET | ORAL | 0 refills | Status: DC

## 2022-05-17 NOTE — Telephone Encounter (Signed)
Next Visit: 6/14/024  Last Visit: 03/22/2022  Last Fill:02/11/2022  Dx: Psoriatic arthritis  Current Dose per office note on 03/22/2022: prednisone 5 mg daily   Okay to refill prednisone?

## 2022-05-21 ENCOUNTER — Other Ambulatory Visit: Payer: Self-pay | Admitting: Cardiovascular Disease

## 2022-05-21 ENCOUNTER — Other Ambulatory Visit: Payer: Self-pay | Admitting: Physician Assistant

## 2022-05-21 NOTE — Telephone Encounter (Signed)
Next Visit: 08/16/2022  Last Visit: 03/22/2022  Last Fill: 12/21/2021  DX: Rheumatoid arthritis involving multiple sites with positive rheumatoid factor   Current Dose per office note 03/22/2022:  Imuran 50 mg 3 tablets by mouth daily   Labs: 03/19/2022 Glucose 109, Calcium 8.6, WBC 14.9, RDW 16.2, Neutro Abs 11.3, Monocytes  Okay to refill Imuran?

## 2022-06-17 ENCOUNTER — Other Ambulatory Visit: Payer: Self-pay | Admitting: Cardiovascular Disease

## 2022-07-01 ENCOUNTER — Other Ambulatory Visit: Payer: Self-pay | Admitting: *Deleted

## 2022-07-01 MED ORDER — ORENCIA CLICKJECT 125 MG/ML ~~LOC~~ SOAJ: 125.0000 mg | SUBCUTANEOUS | 0 refills | Status: DC

## 2022-07-01 NOTE — Telephone Encounter (Signed)
Last Fill: 02/04/2022  Labs: 03/19/2022 Glucose 109, Calcium 8.6, Albumin 2.9, WBC 14.9, RDW 16.2, Neutro Abs 11.3, Monocytes Absolute 1.1, Abs Immature Granulocytes 0.08. No voicemail.  TB Gold: CT Chest 09/20/2021  No significant change in moderate to severe pulmonary fibrosis in a pattern with apical to basal gradient, featuring cystic change and bronchiolectasis with associated ground-glass and extensive subpleural sparing about the lung bases. Findings are suggestive of an alternative diagnosis (not UIP) per consensus guidelines: Diagnosis of Idiopathic Pulmonary Fibrosis: An Official ATS/ERS/JRS/ALAT Clinical Practice Guideline. Am Rosezetta Schlatter Crit Care Med Vol 198, Iss 5, [phone], Nov 02 2016. 2. Diffuse bilateral bronchial wall thickening, consistent with nonspecific infectious or inflammatory bronchitis. 3. Cardiomegaly and coronary artery disease.     Next Visit: 08/16/2022  Last Visit: 03/22/2022  VW:[id number] arthritis involving multiple sites with positive rheumatoid factor   Current Dose per office note 03/22/2022: orencia 125 mg sq injections once weekly.   Okay to refill Orencia?

## 2022-07-24 ENCOUNTER — Other Ambulatory Visit: Payer: Self-pay

## 2022-07-24 DIAGNOSIS — I480 Paroxysmal atrial fibrillation: Secondary | ICD-10-CM

## 2022-07-24 MED ORDER — APIXABAN 5 MG PO TABS: 5.0000 mg | ORAL_TABLET | Freq: Two times a day (BID) | ORAL | 3 refills | Status: DC

## 2022-07-24 NOTE — Progress Notes (Signed)
Provider portion and prescription faxed to [facility]. Confirmation of successful fax received.

## 2022-08-02 NOTE — Progress Notes (Signed)
Office Visit Note  Patient: Curtis Flores             Date of Birth: 01-26-1956           MRN: [id number]             PCP: Eartha Inch, MD Referring: Eartha Inch, MD Visit Date: 08/16/2022 Occupation: @GUAROCC @  Subjective:  Medication monitoring   History of Present Illness: Curtis Flores is a 67 y.o. male with history of seropositive rheumatoid arthritis, psoriasis, and pulmonary fibrosis.  Patient is taking Imuran 50 mg 3 tablets by mouth daily, prednisone 5 mg daily and orencia 125 mg sq injections once weekly.  He is tolerating combination with without any side effects and has not missed any doses recently.  He continues to experience intermittent stiffness in both hands and has some psoriasis on his scalp.  He states that he has noticed improvement in his scalp psoriasis since using clobetasol scalp solution.  Patient states he needs to schedule a follow-up visit at [facility] pulmonary since he will be due for a high-resolution chest CT in July 2024.  He has been trying to walk on a regular basis for exercise.    Activities of Daily Living:  Patient reports morning stiffness for 1-2 hours.   Patient Denies nocturnal pain.  Difficulty dressing/grooming: Denies Difficulty climbing stairs: Denies Difficulty getting out of chair: Denies Difficulty using hands for taps, buttons, cutlery, and/or writing: Reports  Review of Systems  Constitutional:  Negative for fatigue.  HENT:  Positive for mouth dryness. Negative for mouth sores.   Eyes:  Positive for dryness.  Respiratory:  Positive for shortness of breath.   Cardiovascular:  Negative for chest pain and palpitations.  Gastrointestinal:  Negative for blood in stool, constipation and diarrhea.  Endocrine: Negative for increased urination.  Genitourinary:  Negative for involuntary urination.  Musculoskeletal:  Positive for gait problem, muscle weakness and morning stiffness. Negative for joint pain, joint pain, joint  swelling, myalgias, muscle tenderness and myalgias.  Skin:  Positive for rash. Negative for color change and sensitivity to sunlight.  Allergic/Immunologic: Negative for susceptible to infections.  Neurological:  Positive for dizziness. Negative for headaches.  Hematological:  Negative for swollen glands.  Psychiatric/Behavioral:  Negative for depressed mood and sleep disturbance. The patient is not nervous/anxious.     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/09/2020   Atrial fibrillation with RVR (HCC) 05/28/2018   Chronic anticoagulation 05/28/2018   NSTEMI (non-ST elevated myocardial infarction) (HCC) 05/09/2018   Ischemic cardiomyopathy 12/04/2016   Leukocytosis 07/17/2016   Pain in joint involving multiple sites 04/25/2016   Anemia 04/16/2016   Coronary artery disease 04/16/2016   History of atrial fibrillation 04/16/2016   Psoriatic arthritis (HCC) 02/14/2016   High risk medication use 02/14/2016   NICM (nonischemic cardiomyopathy) (HCC) 08/29/2015   Essential hypertension 08/29/2015   Abnormal CT scan, stomach    Hiatal hernia    GERD (gastroesophageal reflux disease) 08/01/2015   Weight loss, unintentional 08/01/2015   Iron deficiency anemia 06/06/2015   Heme positive stool 06/06/2015   PAF (paroxysmal atrial fibrillation) (HCC) 05/30/2015   CAD S/P percutaneous coronary angioplasty 04/07/2015   Old lateral wall myocardial infarction 04/07/2015   Acute on chronic systolic  congestive heart failure (HCC) 04/07/2015   LBBB (left bundle branch block) 04/07/2015   Wide-complex tachycardia 04/07/2015   Atrial flutter (HCC) 04/07/2015   Dyslipidemia, goal LDL below 70 03/07/2015   Psoriasis    Rheumatoid arthritis (HCC)     Pulmonary fibrosis (HCC) 03/21/2011    Past Medical History:  Diagnosis Date   Abnormal CT scan, stomach    Chronic systolic dysfunction of left ventricle    Coronary artery disease    lateral STEMI 02/22/15 3/10 cutting balloon to ISR of the o/pLCX   GERD (gastroesophageal reflux disease)    Hyperlipidemia    Hypertension    Ischemic cardiomyopathy    LBBB (left bundle branch block)    Leukocytosis    followed by hematology, reactive   Lymphadenopathy    Myocardial infarction (HCC) 02/2015   Psoriasis 2003   Psoriatic arthritis (HCC)    Pulmonary fibrosis (HCC)    Rheumatoid arthritis(714.0) 2012   Typical atrial flutter (HCC)     Family History  Problem Relation Age of Onset   Hypertension Mother    Colon cancer Neg Hx    Past Surgical History:  Procedure Laterality Date   CARDIAC CATHETERIZATION N/A 02/22/2015   Procedure: Left Heart Cath and Coronary Angiography;  Surgeon: Runell Gess, MD;  Location: [facility] INVASIVE CV LAB;  Service: Cardiovascular;  Laterality: N/A;   CARDIAC CATHETERIZATION N/A 02/22/2015   Procedure: Coronary Stent Intervention;  Surgeon: Runell Gess, MD;  Location: [facility] INVASIVE CV LAB;  Service: Cardiovascular;  Laterality: N/A;   CARDIOVERSION N/A 04/12/2015   Procedure: CARDIOVERSION;  Surgeon: Laurey Morale, MD;  Location: [facility] ENDOSCOPY;  Service: Cardiovascular;  Laterality: N/A;   COLONOSCOPY  07/01/2003   NWG:NFAOZH colonic mucosa except for the proximal right colon in the area of ICV which was not seen completely due to inadequate bowel prep. followed with ACBE which was normal.    COLONOSCOPY N/A 08/24/2015   Dr. Jena Gauss: Normal colon. Next colonoscopy in 10 years.   CORONARY BALLOON ANGIOPLASTY N/A 05/12/2018   Procedure: CORONARY BALLOON ANGIOPLASTY;  Surgeon: Lyn Records, MD;  Location: [facility] INVASIVE CV LAB;  Service: Cardiovascular;  Laterality: N/A;   ELECTROPHYSIOLOGIC STUDY N/A 05/30/2015   Atrial fibrillation ablation by Dr Johney Frame    ESOPHAGOGASTRODUODENOSCOPY N/A 08/24/2015   Dr. Jena Gauss: Medium-sized hiatal hernia, erosive gastropathy. Cameron lesions. Esophageal mucosa distally suggestive of short segment Barrett's esophagus. Not confirmed on biopsy. Gastric biopsy with minimal chronic inflammation   GIVENS CAPSULE STUDY N/A 04/17/2016   Procedure: GIVENS CAPSULE STUDY;  Surgeon: Corbin Ade, MD;  Location: [facility] ENDO SUITE;  Service: Endoscopy;  Laterality: N/A;  Pt to arrive at 8:00 am for 8:30 am appt   LEFT HEART CATH AND CORONARY ANGIOGRAPHY N/A 05/11/2018   Procedure: LEFT HEART CATH AND CORONARY ANGIOGRAPHY;  Surgeon: Lyn Records, MD;  Location: [facility] INVASIVE CV LAB;  Service: Cardiovascular;  Laterality: N/A;   TEE WITHOUT CARDIOVERSION N/A 04/12/2015   Procedure: TRANSESOPHAGEAL ECHOCARDIOGRAM (TEE);  Surgeon: Laurey Morale, MD;  Location: [facility] ENDOSCOPY;  Service: Cardiovascular;  Laterality: N/A;   Social History   Social History Narrative   Not on file   Immunization History  Administered Date(s) Administered   Fluad Quad(high Dose 65+) 12/09/2018, 11/01/2020, 11/17/2021   Influenza Split 12/22/2010   Influenza Whole 01/18/2010   Influenza, High Dose Seasonal PF 11/11/2017, 12/02/2018   Influenza, Seasonal, Injecte, Preservative Fre 12/03/2014, 12/13/2015, 12/03/2017   Influenza,inj,Quad PF,6+ Mos 11/29/2019   Influenza-Unspecified  12/03/2014, 12/13/2015, 11/12/2016   Moderna Sars-Covid-2 Vaccination 05/08/2019, 06/09/2019, 01/29/2020, 08/09/2020   PFIZER(Purple Top)SARS-COV-2 Vaccination 10/23/2020   Pneumococcal Conjugate-13 08/20/2016, 04/07/2018   Pneumococcal Polysaccharide-23 01/22/2011, 03/05/2011, 03/09/2021   Pneumococcal-Unspecified 01/22/2011, 03/05/2011   Unspecified SARS-COV-2 Vaccination 12/14/2021     Objective: Vital Signs: BP 133/70 (BP Location: Left Arm, Patient Position: Sitting, Cuff Size: Normal)   Pulse (!) 47   Resp 15   Ht 5\' 6"  (1.676 m)   Wt 177 lb (80.3 kg)   BMI 28.57  kg/m    Physical Exam Vitals and nursing note reviewed.  Constitutional:      Appearance: He is well-developed.  HENT:     Head: Normocephalic and atraumatic.  Eyes:     Conjunctiva/sclera: Conjunctivae normal.     Pupils: Pupils are equal, round, and reactive to light.  Cardiovascular:     Rate and Rhythm: Normal rate and regular rhythm.     Heart sounds: Normal heart sounds.  Pulmonary:     Effort: Pulmonary effort is normal.     Breath sounds: Normal breath sounds.  Abdominal:     General: Bowel sounds are normal.     Palpations: Abdomen is soft.  Musculoskeletal:     Cervical back: Normal range of motion and neck supple.  Skin:    General: Skin is warm and dry.     Capillary Refill: Capillary refill takes less than 2 seconds.  Neurological:     Mental Status: He is alert and oriented to person, place, and time.  Psychiatric:        Behavior: Behavior normal.      Musculoskeletal Exam: C-spine has limited range of motion without rotation.  Shoulder joints have good range of motion with no discomfort.  Elbow joints and wrist joints have good range of motion.  Synovial thickening of all MCP joints.  Chronic synovitis of bilateral fifth MCP joints. Inflammation in the right 1st PIP. PIP and DIP thickening.  Complete fist formation bilaterally.  Hip joints have good range of motion with no groin pain.  Knee joints have good range of motion with no warmth or effusion.  Ankle joints have good range of motion with no joint tenderness or joint swelling.  CDAI Exam: CDAI Score: 12  Patient Global: 40 / 100; Provider Global: 40 / 100 Swollen: 4 ; Tender: 0  Joint Exam 08/16/2022      Right  Left  MCP 2     Swollen   MCP 5  Swollen   Swollen   IP  Swollen         Investigation: No additional findings.  Imaging: No results found.  Recent Labs: Lab Results  Component Value Date   WBC 11.7 (H) 08/09/2022   HGB 12.8 (L) 08/09/2022   PLT 267 08/09/2022   NA 134 (L)  08/09/2022   K 4.1 08/09/2022   CL 101 08/09/2022   CO2 28 08/09/2022   GLUCOSE 105 (H) 08/09/2022   BUN 13 08/09/2022   CREATININE 0.71 08/09/2022   BILITOT 0.5 08/09/2022   ALKPHOS 97 08/09/2022   AST 26 08/09/2022   ALT 22 08/09/2022   PROT 7.0 08/09/2022   ALBUMIN 2.9 (L) 08/09/2022   CALCIUM 8.7 (L) 08/09/2022   GFRAA 114 03/14/2020   QFTBGOLDPLUS Positive (A) 08/02/2020    Speciality Comments: No specialty comments available.  Procedures:  No procedures performed Allergies: Patient has no known allergies.   Assessment / Plan:     Visit Diagnoses: Rheumatoid arthritis  involving multiple sites with positive rheumatoid factor (HCC) - Severe disease: Thickening of all MCP, PIP, and DIP joints: Patient has noticed clinical improvement since increasing the dose of Imuran from 2 tablets to 3 tablets daily.  He remains on Orencia 125 mg subcu days injections once weekly along with prednisone 5 mg 1 tablet daily.  He continues to experience intermittent stiffness in both hands but overall his symptoms have been more manageable on combination therapy.  No medication changes will be made at this time.  He was advised to notify us if he develops signs or symptoms of a flare.  He will follow-up in the office in 5 months or sooner if needed.  Psoriatic arthritis (HCC): No dactylitis was noted on examination today.  He has not had any Achilles tendinitis or plantar fasciitis.  No SI joint discomfort.  The psoriasis on his scalp has improved with the use of clobetasol scalp solution.  He continues to experience pain and intermittent inflammation in his hands but overall has noticed improvement since increasing the dose of Imuran from 2 tablets to 3 tablets daily and remaining on Orencia and prednisone as prescribed.  He was advised to notify us if he develops signs or symptoms of a flare.  Psoriasis: Scalp-improved.  Patient uses clobetasol scalp solution as needed which he finds to be  helpful.  High risk medication use - Imuran 50 mg 3 tablets by mouth daily, prednisone 5 mg daily, and orencia 125 mg sq injections once weekly. CBC and CMP updated on 08/09/22.  His next lab work will be due in September and every 3 months to monitor for drug toxicity. High resolution chest CT updated on 09/20/21 No recent or recurrent infections.  Discussed the importance of holding imuran and orencia if he develops signs or symptoms of an infection and to resume once the infection has completely cleared.   Pulmonary fibrosis (HCC) - PFTs updated 05/16/2020.  Followed by Dr. Vassie Loll. High resolution chest CT updated on 09/20/21:  No significant change in moderate to severe pulmonary fibrosis.  Advised patient to call Dr. Vassie Loll to schedule a routine follow-up visit.  Patient remains on Imuran as prescribed along with prednisone 5 mg daily.   He has been trying to walk on a regular basis for exercise.  Positive QuantiFERON-TB Gold test - Dr. Renold Don who initiated INH/B6.  He has completed therapy for latent TB and was cleared to resume imuran as prescribed.  He will require yearly chest x-rays.   Osteopenia of multiple sites - DEXA updated on 04/28/2020: FRAX not reported because of the T-scores for spine total, hip total, femoral neck at or above -1.0.  Due to update DEXA in Feb 2024.  DEXA order remains active for [facility].  He was given the phone number to [facility] to call to set up an appointment. He remains on long-term prednisone 5 mg 1 tablet daily.  Other medical conditions are listed as follows:  History of congestive heart failure  History of hyperlipidemia  History of bronchiectasis  History of gastroesophageal reflux (GERD)  History of coronary artery disease  History of atrial fibrillation  History of hypertension: Blood pressure was 133/70 today in the office.  Orders: No orders of the defined types were placed in this encounter.  No orders of the defined types were placed in this  encounter.    Follow-Up Instructions: Return in about 5 months (around 01/16/2023) for Rheumatoid arthritis, Psoriatic arthritis.   Gearldine Bienenstock, PA-C  Note -  This record has been created using [employer].  Chart creation errors have been sought, but may not always  have been located. Such creation errors do not reflect on  the standard of medical care.

## 2022-08-06 ENCOUNTER — Other Ambulatory Visit: Payer: Self-pay | Admitting: Physician Assistant

## 2022-08-06 DIAGNOSIS — L4050 Arthropathic psoriasis, unspecified: Secondary | ICD-10-CM

## 2022-08-06 DIAGNOSIS — M0579 Rheumatoid arthritis with rheumatoid factor of multiple sites without organ or systems involvement: Secondary | ICD-10-CM

## 2022-08-06 DIAGNOSIS — L409 Psoriasis, unspecified: Secondary | ICD-10-CM

## 2022-08-06 DIAGNOSIS — Z79899 Other long term (current) drug therapy: Secondary | ICD-10-CM

## 2022-08-06 MED ORDER — PREDNISONE 5 MG PO TABS: ORAL_TABLET | ORAL | 0 refills | Status: DC

## 2022-08-06 NOTE — Telephone Encounter (Signed)
Spoke with patient's wife Curtis Flores and advised that Curtis Flores is overdue for labs. Advised we will need labs as soon as possible before refilling his medication. She verbalized understanding and states he will update them on Friday. Orders released. Curtis Flores states that Curtis Flores does need a refill of Prednisone sent to the local pharmacy.   Next Visit: 6/14/024   Last Visit: 03/22/2022   Last Fill: 05/17/2022   Dx: Psoriatic arthritis   Current Dose per office note on 03/22/2022: prednisone 5 mg daily    Okay to refill prednisone?

## 2022-08-09 ENCOUNTER — Other Ambulatory Visit (HOSPITAL_COMMUNITY)
Admission: RE | Admit: 2022-08-09 | Discharge: 2022-08-09 | Disposition: A | Discharge status: Home / Self Care | Payer: Medicare HMO | Source: Ambulatory Visit | Attending: Rheumatology | Admitting: Rheumatology

## 2022-08-09 DIAGNOSIS — Z79899 Other long term (current) drug therapy: Secondary | ICD-10-CM | POA: Diagnosis present

## 2022-08-09 LAB — CBC WITH DIFFERENTIAL/PLATELET
Abs Immature Granulocytes: 0.06 10*3/uL (ref 0.00–0.07)
Basophils Absolute: 0.1 10*3/uL (ref 0.0–0.1)
Basophils Relative: 1 %
Eosinophils Absolute: 0.1 10*3/uL (ref 0.0–0.5)
Eosinophils Relative: 1 %
HCT: 40.4 % (ref 39.0–52.0)
Hemoglobin: 12.8 g/dL — ABNORMAL LOW (ref 13.0–17.0)
Immature Granulocytes: 1 %
Lymphocytes Relative: 16 %
Lymphs Abs: 1.9 10*3/uL (ref 0.7–4.0)
MCH: 27.2 pg (ref 26.0–34.0)
MCHC: 31.7 g/dL (ref 30.0–36.0)
MCV: 86 fL (ref 80.0–100.0)
Monocytes Absolute: 1 10*3/uL (ref 0.1–1.0)
Monocytes Relative: 8 %
Neutro Abs: 8.6 10*3/uL — ABNORMAL HIGH (ref 1.7–7.7)
Neutrophils Relative %: 73 %
Platelets: 267 10*3/uL (ref 150–400)
RBC: 4.7 MIL/uL (ref 4.22–5.81)
RDW: 15 % (ref 11.5–15.5)
WBC: 11.7 10*3/uL — ABNORMAL HIGH (ref 4.0–10.5)
nRBC: 0 % (ref 0.0–0.2)

## 2022-08-09 LAB — COMPREHENSIVE METABOLIC PANEL
ALT: 22 U/L (ref 0–44)
AST: 26 U/L (ref 15–41)
Albumin: 2.9 g/dL — ABNORMAL LOW (ref 3.5–5.0)
Alkaline Phosphatase: 97 U/L (ref 38–126)
Anion gap: 5 (ref 5–15)
BUN: 13 mg/dL (ref 8–23)
CO2: 28 mmol/L (ref 22–32)
Calcium: 8.7 mg/dL — ABNORMAL LOW (ref 8.9–10.3)
Chloride: 101 mmol/L (ref 98–111)
Creatinine, Ser: 0.71 mg/dL (ref 0.61–1.24)
GFR, Estimated: >60 mL/min (ref >60)
Glucose, Bld: 105 mg/dL — ABNORMAL HIGH (ref 70–99)
Potassium: 4.1 mmol/L (ref 3.5–5.1)
Sodium: 134 mmol/L — ABNORMAL LOW (ref 135–145)
Total Bilirubin: 0.5 mg/dL (ref 0.3–1.2)
Total Protein: 7 g/dL (ref 6.5–8.1)

## 2022-08-09 NOTE — Progress Notes (Signed)
Hemoglobin is low and stable.  White cell count is elevated and stable.  CMP stable.

## 2022-08-14 ENCOUNTER — Other Ambulatory Visit: Payer: Self-pay | Admitting: Physician Assistant

## 2022-08-14 NOTE — Telephone Encounter (Signed)
Last Fill: 10/19/2021  Next Visit: 08/16/2022  Last Visit: 03/22/2022  Dx: Psoriasis   Current Dose per office note on 03/22/2022: obetasol 0.05% external solution and/or cream topically.   Okay to refill Clobetasol Cream?

## 2022-08-16 ENCOUNTER — Ambulatory Visit: Payer: Medicare HMO | Attending: Physician Assistant | Admitting: Physician Assistant

## 2022-08-16 ENCOUNTER — Encounter: Payer: Self-pay | Admitting: Physician Assistant

## 2022-08-16 VITALS — BP 133/70 | HR 47 | Resp 15 | Ht 66.0 in | Wt 177.0 lb

## 2022-08-16 DIAGNOSIS — Z8709 Personal history of other diseases of the respiratory system: Secondary | ICD-10-CM

## 2022-08-16 DIAGNOSIS — L409 Psoriasis, unspecified: Secondary | ICD-10-CM

## 2022-08-16 DIAGNOSIS — M8589 Other specified disorders of bone density and structure, multiple sites: Secondary | ICD-10-CM

## 2022-08-16 DIAGNOSIS — R7612 Nonspecific reaction to cell mediated immunity measurement of gamma interferon antigen response without active tuberculosis: Secondary | ICD-10-CM

## 2022-08-16 DIAGNOSIS — Z8679 Personal history of other diseases of the circulatory system: Secondary | ICD-10-CM

## 2022-08-16 DIAGNOSIS — Z79899 Other long term (current) drug therapy: Secondary | ICD-10-CM | POA: Diagnosis not present

## 2022-08-16 DIAGNOSIS — M0579 Rheumatoid arthritis with rheumatoid factor of multiple sites without organ or systems involvement: Secondary | ICD-10-CM | POA: Diagnosis not present

## 2022-08-16 DIAGNOSIS — L4050 Arthropathic psoriasis, unspecified: Secondary | ICD-10-CM

## 2022-08-16 DIAGNOSIS — Z8719 Personal history of other diseases of the digestive system: Secondary | ICD-10-CM

## 2022-08-16 DIAGNOSIS — Z8639 Personal history of other endocrine, nutritional and metabolic disease: Secondary | ICD-10-CM

## 2022-08-16 DIAGNOSIS — J8410 Pulmonary fibrosis, unspecified: Secondary | ICD-10-CM

## 2022-08-16 NOTE — Patient Instructions (Addendum)
Please call [facility] to schedule bone density scan Phone: [phone]   Standing Labs We placed an order today for your standing lab work.   Please have your standing labs drawn in September and every 3 months   Please have your labs drawn 2 weeks prior to your appointment so that the provider can discuss your lab results at your appointment, if possible.  Please note that you may see your imaging and lab results in MyChart before we have reviewed them. We will contact you once all results are reviewed. Please allow our office up to 72 hours to thoroughly review all of the results before contacting the office for clarification of your results.  WALK-IN LAB HOURS  Monday through Thursday from 8:00 am -12:30 pm and 1:00 pm-5:00 pm and Friday from 8:00 am-12:00 pm.  Patients with office visits requiring labs will be seen before walk-in labs.  You may encounter longer than normal wait times. Please allow additional time. Wait times may be shorter on  Monday and Thursday afternoons.  We do not book appointments for walk-in labs. We appreciate your patience and understanding with our staff.   Labs are drawn by [facility]. Please bring your co-pay at the time of your lab draw.  You may receive a bill from [facility] for your lab work.  Please note if you are on Hydroxychloroquine and and an order has been placed for a Hydroxychloroquine level,  you will need to have it drawn 4 hours or more after your last dose.  If you wish to have your labs drawn at another location, please call the office 24 hours in advance so we can fax the orders.  The office is located at [address], [address], [place], [place] [address]   If you have any questions regarding directions or hours of operation,  please call [phone].   As a reminder, please drink plenty of water prior to coming for your lab work. Thanks!

## 2022-09-02 ENCOUNTER — Other Ambulatory Visit: Payer: Self-pay | Admitting: Cardiovascular Disease

## 2022-09-02 DIAGNOSIS — I480 Paroxysmal atrial fibrillation: Secondary | ICD-10-CM

## 2022-09-02 NOTE — Telephone Encounter (Signed)
Prescription refill request for Eliquis received. Indication: PAF Last office visit: 03/15/22  Erlene Quan MD Scr: 0.71 on 08/09/22  Epic Age: 67 Weight: 86.1kg  Based on above findings Eliquis 5mg  twice daily is the appropriate dose.  Refill approved.

## 2022-09-13 ENCOUNTER — Other Ambulatory Visit: Payer: Self-pay | Admitting: *Deleted

## 2022-09-13 MED ORDER — ORENCIA CLICKJECT 125 MG/ML ~~LOC~~ SOAJ: 125.0000 mg | SUBCUTANEOUS | 0 refills | Status: DC

## 2022-09-13 NOTE — Telephone Encounter (Signed)
Refill request received via fax from [facility] for Orencia   Last Fill: 07/01/2022  Labs: 08/09/2022 Hemoglobin is low and stable.  White cell count is elevated and stable.  CMP stable.   High resolution chest CT updated on 09/20/21   Next Visit: 01/17/2023  Last Visit: 08/16/2022  [id number] arthritis involving multiple sites with positive rheumatoid factor   Current Dose per office note 08/16/2022: orencia 125 mg sq injections once weekly   Okay to refill Orencia?

## 2022-10-08 ENCOUNTER — Other Ambulatory Visit: Payer: Self-pay | Admitting: *Deleted

## 2022-10-08 MED ORDER — AZATHIOPRINE 50 MG PO TABS: 150.0000 mg | ORAL_TABLET | Freq: Every day | ORAL | 0 refills | Status: DC

## 2022-10-08 NOTE — Telephone Encounter (Signed)
Refill request received via fax from [facility] for Imuran   Last Fill: 05/21/2022  Labs: 08/09/2022 Hemoglobin is low and stable.  White cell count is elevated and stable.  CMP stable.   Next Visit: 01/17/2023  Last Visit: 08/16/2022  DX: Rheumatoid arthritis involving multiple sites with positive rheumatoid factor   Current Dose per office note 08/16/2022: Imuran 50 mg 3 tablets by mouth daily   Okay to refill Imuran?

## 2022-10-28 ENCOUNTER — Other Ambulatory Visit: Payer: Self-pay | Admitting: *Deleted

## 2022-10-28 DIAGNOSIS — L4050 Arthropathic psoriasis, unspecified: Secondary | ICD-10-CM

## 2022-10-28 DIAGNOSIS — M0579 Rheumatoid arthritis with rheumatoid factor of multiple sites without organ or systems involvement: Secondary | ICD-10-CM

## 2022-10-28 DIAGNOSIS — L409 Psoriasis, unspecified: Secondary | ICD-10-CM

## 2022-10-28 DIAGNOSIS — Z79899 Other long term (current) drug therapy: Secondary | ICD-10-CM

## 2022-10-28 MED ORDER — PREDNISONE 5 MG PO TABS: ORAL_TABLET | ORAL | 0 refills | Status: DC

## 2022-10-28 NOTE — Telephone Encounter (Signed)
Last Fill: 08/06/2022   Next Visit: 01/24/2023  Last Visit: 08/16/2022  Dx: Rheumatoid arthritis involving multiple sites with positive rheumatoid factor (   Current Dose per office note on 08/16/2022: prednisone 5 mg 1 tablet daily.   Okay to refill Prednisone?

## 2022-10-31 ENCOUNTER — Other Ambulatory Visit: Payer: Self-pay | Admitting: Cardiovascular Disease

## 2022-10-31 ENCOUNTER — Other Ambulatory Visit: Payer: Self-pay | Admitting: Rheumatology

## 2022-10-31 DIAGNOSIS — Z79899 Other long term (current) drug therapy: Secondary | ICD-10-CM

## 2022-10-31 DIAGNOSIS — L409 Psoriasis, unspecified: Secondary | ICD-10-CM

## 2022-10-31 DIAGNOSIS — M0579 Rheumatoid arthritis with rheumatoid factor of multiple sites without organ or systems involvement: Secondary | ICD-10-CM

## 2022-10-31 DIAGNOSIS — L4050 Arthropathic psoriasis, unspecified: Secondary | ICD-10-CM

## 2022-12-13 LAB — HM DEXA SCAN: HM Dexa Scan: NORMAL

## 2022-12-17 ENCOUNTER — Telehealth: Payer: Self-pay | Admitting: *Deleted

## 2022-12-17 NOTE — Telephone Encounter (Signed)
Received DEXA results from [facility].  Date of Scan: 12/13/2022  Lowest T-score: -1.3  BMD: 0.887  Lowest site measured: 1/3 Left distal radius   DX: WNL  Significant changes in BMD and site measured (5% and above):28 % Right Total Femur, 42 % Left Total Femur  Current Regimen: n/a  Recommendation:Discuss at follow up visit  Reviewed by: Sherron Ales, PA-C  Next Appointment:  01/24/2023

## 2022-12-20 ENCOUNTER — Other Ambulatory Visit: Payer: Self-pay | Admitting: Physician Assistant

## 2022-12-26 ENCOUNTER — Encounter: Payer: Self-pay | Admitting: Physician Assistant

## 2022-12-31 ENCOUNTER — Other Ambulatory Visit: Payer: Self-pay | Admitting: *Deleted

## 2022-12-31 ENCOUNTER — Telehealth: Payer: Self-pay | Admitting: Rheumatology

## 2022-12-31 MED ORDER — ORENCIA CLICKJECT 125 MG/ML ~~LOC~~ SOAJ: 125.0000 mg | SUBCUTANEOUS | 0 refills | Status: DC

## 2022-12-31 NOTE — Telephone Encounter (Signed)
Patient contacted the office to request a medication refill.   1. Name of Medication: Orencia  2. How are you currently taking this medication (dosage and times per day)? Once a week   3. What pharmacy would you like for that to be sent to? [facility]    Pt is upset and needs this medication by Friday.

## 2022-12-31 NOTE — Telephone Encounter (Signed)
Last Fill: 09/13/2022  Labs: 10/15/2022 WBC 10.9, Hemoglobin 12.9, RDW 15.8, Neutrophils 8.3, Creatinine 0.62, Albumin 3.2, Alkaline Phosphatase 143  TB Gold: 09/20/2021 CT Chest   Next Visit: 01/24/2023  Last Visit: 08/16/2022  [id number] arthritis involving multiple sites with positive rheumatoid factor   Current Dose per office note 08/16/2022: orencia 125 mg sq injections once weekly.   Okay to refill Orencia?

## 2022-12-31 NOTE — Telephone Encounter (Signed)
Due to update CXR

## 2022-12-31 NOTE — Telephone Encounter (Signed)
Phone number is not inservice.

## 2023-01-06 ENCOUNTER — Other Ambulatory Visit: Payer: Self-pay | Admitting: *Deleted

## 2023-01-06 DIAGNOSIS — L4050 Arthropathic psoriasis, unspecified: Secondary | ICD-10-CM

## 2023-01-06 DIAGNOSIS — L409 Psoriasis, unspecified: Secondary | ICD-10-CM

## 2023-01-06 DIAGNOSIS — J8410 Pulmonary fibrosis, unspecified: Secondary | ICD-10-CM

## 2023-01-06 DIAGNOSIS — Z79899 Other long term (current) drug therapy: Secondary | ICD-10-CM

## 2023-01-06 DIAGNOSIS — R7612 Nonspecific reaction to cell mediated immunity measurement of gamma interferon antigen response without active tuberculosis: Secondary | ICD-10-CM

## 2023-01-06 DIAGNOSIS — M0579 Rheumatoid arthritis with rheumatoid factor of multiple sites without organ or systems involvement: Secondary | ICD-10-CM

## 2023-01-07 ENCOUNTER — Encounter: Payer: Self-pay | Admitting: *Deleted

## 2023-01-07 ENCOUNTER — Telehealth: Payer: Self-pay | Admitting: Pharmacist

## 2023-01-07 NOTE — Telephone Encounter (Signed)
Received notification that patient is due for BMS PAP renewal for Orencia SQ. Patient portion mailed to patient's home. Provider portion and PA renewal will be completed once patient portion is returned   Of note regarding requirements:  If FPL less than 150% of the federal poverty level, patients will be asked to provide proof of denial for the Medicare Part D LIS/Extra Help. They also require proof of OOP rx expenses   Chesley Mires, PharmD, MPH, BCPS, CPP Clinical Pharmacist (Rheumatology and Pulmonology)

## 2023-01-07 NOTE — Telephone Encounter (Signed)
Curtis Flores mailed letter to patient, Chest x-ray ordered, unable to reach patient.

## 2023-01-10 NOTE — Progress Notes (Signed)
Office Visit Note  Patient: Curtis Flores             Date of Birth: 1955-06-05           MRN: [id number]             PCP: Eartha Inch, MD Referring: Eartha Inch, MD Visit Date: 01/24/2023 Occupation: @GUAROCC @  Subjective:  Discuss DEXA results   History of Present Illness: Curtis Flores is a 67 y.o. male with history of psoriatic arthritis and rheumatoid arthritis.  Patient remains on  Imuran 50 mg 3 tablets by mouth daily, prednisone 5 mg daily, and orencia 125 mg sq injections once weekly.  He is tolerating combination therapy without any side effects.  Patient continues to experience soreness and stiffness first thing in the mornings and with weather changes.  His discomfort is typically most severe in his shoulders and hands.  He denies any Achilles tendinitis or plantar fasciitis.  He denies any SI joint pain at this time.  Patient continues to have psoriasis on his scalp but has requested clobetasol solution to alleviate the symptoms.  Patient remains under the care of Dr. Vassie Loll and Dr. Allyson Sabal and has upcoming appointments on 03/11/22 and 02/10/23 respectively.  Patient would like to discuss DEXA results today.      Activities of Daily Living:  Patient reports morning stiffness for several hours.   Patient Reports nocturnal pain.  Difficulty dressing/grooming: Reports Difficulty climbing stairs: Reports Difficulty getting out of chair: Reports Difficulty using hands for taps, buttons, cutlery, and/or writing: Reports  Review of Systems  Constitutional:  Positive for fatigue.  HENT:  Negative for mouth sores and mouth dryness.   Eyes:  Positive for visual disturbance and dryness. Negative for pain and redness.  Respiratory:  Positive for cough and shortness of breath. Negative for wheezing.   Cardiovascular:  Negative for chest pain and palpitations.  Gastrointestinal:  Negative for blood in stool, constipation and diarrhea.  Endocrine: Negative for increased  urination.  Genitourinary:  Negative for involuntary urination.  Musculoskeletal:  Positive for joint pain, gait problem, joint pain, joint swelling, muscle weakness and morning stiffness. Negative for myalgias, muscle tenderness and myalgias.  Skin:  Positive for rash. Negative for color change and sensitivity to sunlight.  Allergic/Immunologic: Negative for susceptible to infections.  Neurological:  Negative for dizziness and headaches.  Hematological:  Negative for swollen glands.  Psychiatric/Behavioral:  Positive for sleep disturbance. Negative for depressed mood. The patient is not nervous/anxious.     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/09/2020   Atrial fibrillation with RVR (HCC) 05/28/2018   Chronic anticoagulation 05/28/2018   NSTEMI (non-ST elevated myocardial infarction) (HCC) 05/09/2018   Ischemic cardiomyopathy 12/04/2016   Leukocytosis 07/17/2016   Pain in joint involving multiple sites 04/25/2016   Anemia 04/16/2016   Coronary artery disease 04/16/2016   History of atrial fibrillation 04/16/2016   Psoriatic arthritis (HCC) 02/14/2016   High risk medication use 02/14/2016   NICM (nonischemic cardiomyopathy) (HCC) 08/29/2015   Essential hypertension 08/29/2015   Abnormal CT scan, stomach    Hiatal hernia    GERD (gastroesophageal reflux disease) 08/01/2015   Weight loss, unintentional 08/01/2015   Iron deficiency anemia 06/06/2015  Heme positive stool 06/06/2015   PAF (paroxysmal atrial fibrillation) (HCC) 05/30/2015   CAD S/P percutaneous coronary angioplasty 04/07/2015   Old lateral wall myocardial infarction 04/07/2015   Acute on chronic systolic congestive heart failure (HCC) 04/07/2015    LBBB (left bundle branch block) 04/07/2015   Wide-complex tachycardia 04/07/2015   Atrial flutter (HCC) 04/07/2015   Dyslipidemia, goal LDL below 70 03/07/2015   Psoriasis    Rheumatoid arthritis (HCC)    Pulmonary fibrosis (HCC) 03/21/2011    Past Medical History:  Diagnosis Date   Abnormal CT scan, stomach    Chronic systolic dysfunction of left ventricle    Coronary artery disease    lateral STEMI 02/22/15 3/10 cutting balloon to ISR of the o/pLCX   GERD (gastroesophageal reflux disease)    Hyperlipidemia    Hypertension    Ischemic cardiomyopathy    LBBB (left bundle branch block)    Leukocytosis    followed by hematology, reactive   Lymphadenopathy    Myocardial infarction (HCC) 02/2015   Psoriasis 2003   Psoriatic arthritis (HCC)    Pulmonary fibrosis (HCC)    Rheumatoid arthritis(714.0) 2012   Typical atrial flutter (HCC)     Family History  Problem Relation Age of Onset   Hypertension Mother    Colon cancer Neg Hx    Past Surgical History:  Procedure Laterality Date   CARDIAC CATHETERIZATION N/A 02/22/2015   Procedure: Left Heart Cath and Coronary Angiography;  Surgeon: Runell Gess, MD;  Location: [facility] INVASIVE CV LAB;  Service: Cardiovascular;  Laterality: N/A;   CARDIAC CATHETERIZATION N/A 02/22/2015   Procedure: Coronary Stent Intervention;  Surgeon: Runell Gess, MD;  Location: [facility] INVASIVE CV LAB;  Service: Cardiovascular;  Laterality: N/A;   CARDIOVERSION N/A 04/12/2015   Procedure: CARDIOVERSION;  Surgeon: Laurey Morale, MD;  Location: [facility] ENDOSCOPY;  Service: Cardiovascular;  Laterality: N/A;   COLONOSCOPY  07/01/2003   YQM:VHQION colonic mucosa except for the proximal right colon in the area of ICV which was not seen completely due to inadequate bowel prep. followed with ACBE which was normal.    COLONOSCOPY N/A 08/24/2015   Dr. Jena Gauss: Normal colon. Next colonoscopy in 10 years.   CORONARY BALLOON ANGIOPLASTY N/A 05/12/2018   Procedure: CORONARY  BALLOON ANGIOPLASTY;  Surgeon: Lyn Records, MD;  Location: [facility] INVASIVE CV LAB;  Service: Cardiovascular;  Laterality: N/A;   ELECTROPHYSIOLOGIC STUDY N/A 05/30/2015   Atrial fibrillation ablation by Dr Johney Frame   ESOPHAGOGASTRODUODENOSCOPY N/A 08/24/2015   Dr. Jena Gauss: Medium-sized hiatal hernia, erosive gastropathy. Cameron lesions. Esophageal mucosa distally suggestive of short segment Barrett's esophagus. Not confirmed on biopsy. Gastric biopsy with minimal chronic inflammation   GIVENS CAPSULE STUDY N/A 04/17/2016   Procedure: GIVENS CAPSULE STUDY;  Surgeon: Corbin Ade, MD;  Location: [facility] ENDO SUITE;  Service: Endoscopy;  Laterality: N/A;  Pt to arrive at 8:00 am for 8:30 am appt   LEFT HEART CATH AND CORONARY ANGIOGRAPHY N/A 05/11/2018   Procedure: LEFT HEART CATH AND CORONARY ANGIOGRAPHY;  Surgeon: Lyn Records, MD;  Location: [facility] INVASIVE CV LAB;  Service: Cardiovascular;  Laterality: N/A;   TEE WITHOUT CARDIOVERSION N/A 04/12/2015   Procedure: TRANSESOPHAGEAL ECHOCARDIOGRAM (TEE);  Surgeon: Laurey Morale, MD;  Location: [facility] ENDOSCOPY;  Service: Cardiovascular;  Laterality: N/A;   Social History   Social History Narrative   Not on file   Immunization History  Administered Date(s) Administered   Fluad Quad(high Dose 65+) 12/09/2018, 11/01/2020, 11/17/2021   Influenza Split  12/22/2010   Influenza Whole 01/18/2010   Influenza, High Dose Seasonal PF 11/11/2017, 12/02/2018   Influenza, Seasonal, Injecte, Preservative Fre 12/03/2014, 12/13/2015, 12/03/2017   Influenza,inj,Quad PF,6+ Mos 11/29/2019   Influenza-Unspecified 12/03/2014, 12/13/2015, 11/12/2016   Moderna Sars-Covid-2 Vaccination 05/08/2019, 06/09/2019, 01/29/2020, 08/09/2020   PFIZER(Purple Top)SARS-COV-2 Vaccination 10/23/2020   Pneumococcal Conjugate-13 08/20/2016, 04/07/2018   Pneumococcal Polysaccharide-23 01/22/2011, 03/05/2011, 03/09/2021   Pneumococcal-Unspecified 01/22/2011, 03/05/2011   Unspecified SARS-COV-2  Vaccination 12/14/2021     Objective: Vital Signs: BP 130/69 (BP Location: Left Arm, Patient Position: Sitting, Cuff Size: Normal)   Pulse (!) 51   Resp 15   Ht 5\' 7"  (1.702 m)   Wt 174 lb 3.2 oz (79 kg)   BMI 27.28 kg/m    Physical Exam Vitals and nursing note reviewed.  Constitutional:      Appearance: He is well-developed.  HENT:     Head: Normocephalic and atraumatic.  Eyes:     Conjunctiva/sclera: Conjunctivae normal.     Pupils: Pupils are equal, round, and reactive to light.  Cardiovascular:     Rate and Rhythm: Normal rate and regular rhythm.     Heart sounds: Normal heart sounds.  Pulmonary:     Effort: Pulmonary effort is normal.     Breath sounds: Normal breath sounds.  Abdominal:     General: Bowel sounds are normal.     Palpations: Abdomen is soft.  Musculoskeletal:     Cervical back: Normal range of motion and neck supple.  Skin:    General: Skin is warm and dry.     Capillary Refill: Capillary refill takes less than 2 seconds.     Comments: Erythema and mild scaling noted on the scalp  Neurological:     Mental Status: He is alert and oriented to person, place, and time.  Psychiatric:        Behavior: Behavior normal.      Musculoskeletal Exam: C-spine has limited range of motion with lateral rotation.  Shoulder joints have good range of motion with some discomfort bilaterally.  Elbow joints and wrist joints have good range of motion.  Chronic synovitis noted in the right first PIP and right fifth MCP.  Chronic synovitis noted in the left second and fifth MCP joints.  Synovial thickening of all MCP joints.  PIP and DIP thickening.  Hip joints have good range of motion with no groin pain.  Knee joints have good range of motion with no warmth or effusion.  Ankle joints have good range of motion with no tenderness or joint swelling.  CDAI Exam: CDAI Score: -- Patient Global: --; Provider Global: -- Swollen: --; Tender: -- Joint Exam 01/24/2023   No joint  exam has been documented for this visit   There is currently no information documented on the homunculus. Go to the Rheumatology activity and complete the homunculus joint exam.  Investigation: No additional findings.  Imaging: ECHOCARDIOGRAM COMPLETE  Result Date: 01/14/2023    ECHOCARDIOGRAM REPORT   Patient Name:   WENCES DELPOZO [facility] Date of Exam: 01/14/2023 Medical Rec #:  [id number]      Height:       67.0 in Accession #:    [id number]     Weight:       168.0 lb Date of Birth:  Mar 22, 1955       BSA:          1.878 m Patient Age:    67 years       BP:  117/52 mmHg Patient Gender: M              HR:           62 bpm. Exam Location:  Inpatient Procedure: 2D Echo, Cardiac Doppler and Color Doppler Indications:    Chest Pain  History:        Patient has prior history of Echocardiogram examinations, most                 recent 05/09/2022. CHF, CAD, Signs/Symptoms:Chest Pain; Risk                 Factors:Hypertension and Dyslipidemia.  Sonographer:    Melton Krebs RDCS, FE, PE Referring Phys: [phone] CARINA M BROWN IMPRESSIONS  1. Left ventricular ejection fraction, by estimation, is 35 to 40%. The left ventricle has moderately decreased function. The left ventricle demonstrates global hypokinesis. Left ventricular diastolic parameters are consistent with Grade I diastolic dysfunction (impaired relaxation). Elevated left ventricular end-diastolic pressure.  2. Right ventricular systolic function is normal. The right ventricular size is normal.  3. The mitral valve is normal in structure. Trivial mitral valve regurgitation. No evidence of mitral stenosis.  4. The aortic valve is normal in structure. Aortic valve regurgitation is not visualized. Aortic valve sclerosis/calcification is present, without any evidence of aortic stenosis. Aortic valve area, by VTI measures 2.63 cm. Aortic valve mean gradient measures 3.0 mmHg. Aortic valve Vmax measures 1.24 m/s.  5. The inferior vena cava is normal in size  with greater than 50% respiratory variability, suggesting right atrial pressure of 3 mmHg. FINDINGS  Left Ventricle: Left ventricular ejection fraction, by estimation, is 35 to 40%. The left ventricle has moderately decreased function. The left ventricle demonstrates global hypokinesis. The left ventricular internal cavity size was normal in size. There is no left ventricular hypertrophy. Left ventricular diastolic parameters are consistent with Grade I diastolic dysfunction (impaired relaxation). Elevated left ventricular end-diastolic pressure. Right Ventricle: The right ventricular size is normal. No increase in right ventricular wall thickness. Right ventricular systolic function is normal. Left Atrium: Left atrial size was normal in size. Right Atrium: Right atrial size was normal in size. Pericardium: There is no evidence of pericardial effusion. Mitral Valve: The mitral valve is normal in structure. Trivial mitral valve regurgitation. No evidence of mitral valve stenosis. Tricuspid Valve: The tricuspid valve is normal in structure. Tricuspid valve regurgitation is mild . No evidence of tricuspid stenosis. Aortic Valve: The aortic valve is normal in structure. Aortic valve regurgitation is not visualized. Aortic valve sclerosis/calcification is present, without any evidence of aortic stenosis. Aortic valve mean gradient measures 3.0 mmHg. Aortic valve peak  gradient measures 6.2 mmHg. Aortic valve area, by VTI measures 2.63 cm. Pulmonic Valve: The pulmonic valve was normal in structure. Pulmonic valve regurgitation is not visualized. No evidence of pulmonic stenosis. Aorta: The aortic root is normal in size and structure. Venous: The inferior vena cava is normal in size with greater than 50% respiratory variability, suggesting right atrial pressure of 3 mmHg. IAS/Shunts: No atrial level shunt detected by color flow Doppler.  LEFT VENTRICLE PLAX 2D LVIDd:         5.20 cm      Diastology LVIDs:         4.30 cm       LV e' medial:    3.92 cm/s LV PW:         1.00 cm      LV E/e' medial:  18.9 LV IVS:  0.90 cm      LV e' lateral:   7.72 cm/s LVOT diam:     2.20 cm      LV E/e' lateral: 9.6 LV SV:         73 LV SV Index:   39 LVOT Area:     3.80 cm  LV Volumes (MOD) LV vol d, MOD A2C: 151.0 ml LV vol d, MOD A4C: 116.0 ml LV vol s, MOD A2C: 106.0 ml LV vol s, MOD A4C: 67.9 ml LV SV MOD A2C:     45.0 ml LV SV MOD A4C:     116.0 ml LV SV MOD BP:      50.5 ml RIGHT VENTRICLE RV S prime:     9.46 cm/s TAPSE (M-mode): 1.2 cm LEFT ATRIUM             Index        RIGHT ATRIUM           Index LA diam:        4.40 cm 2.34 cm/m   RA Area:     15.50 cm LA Vol (A2C):   42.9 ml 22.85 ml/m  RA Volume:   38.20 ml  20.34 ml/m LA Vol (A4C):   85.7 ml 45.64 ml/m LA Biplane Vol: 64.8 ml 34.51 ml/m  AORTIC VALVE AV Area (Vmax):    2.75 cm AV Area (Vmean):   2.58 cm AV Area (VTI):     2.63 cm AV Vmax:           124.00 cm/s AV Vmean:          82.700 cm/s AV VTI:            0.278 m AV Peak Grad:      6.2 mmHg AV Mean Grad:      3.0 mmHg LVOT Vmax:         89.60 cm/s LVOT Vmean:        56.100 cm/s LVOT VTI:          0.192 m LVOT/AV VTI ratio: 0.69  AORTA Ao Root diam: 2.90 cm Ao Asc diam:  2.60 cm MITRAL VALVE               TRICUSPID VALVE MV Area (PHT): 2.91 cm    TR Peak grad:   42.2 mmHg MV Decel Time: 261 msec    TR Vmax:        325.00 cm/s MV E velocity: 73.90 cm/s MV A velocity: 93.80 cm/s  SHUNTS MV E/A ratio:  0.79        Systemic VTI:  0.19 m                            Systemic Diam: 2.20 cm Armanda Magic MD Electronically signed by Armanda Magic MD Signature Date/Time: 01/14/2023/4:50:06 PM    Final    DG Chest 2 View  Result Date: 01/13/2023 CLINICAL DATA:  Chest pain. EXAM: CHEST - 2 VIEW COMPARISON:  08/15/2020. FINDINGS: Redemonstration of chronically increased interstitial markings, with relative sparing of right middle lung, essentially similar to the prior study. There is small left pleural effusion, which appears  slightly increased since the prior study. There is trace right pleural effusion, new since the prior study. No acute lung consolidation or lung collapse. Stable cardio-mediastinal silhouette. No acute osseous abnormalities. The soft tissues are within normal limits. IMPRESSION: *Essentially stable chronically increased interstitial markings, as described above. *Small  left and trace right pleural effusions. No acute lung consolidation or lung collapse. Electronically Signed   By: Jules Schick M.D.   On: 01/13/2023 17:08    Recent Labs: Lab Results  Component Value Date   WBC 10.7 (H) 01/13/2023   HGB 12.7 (L) 01/13/2023   PLT 324 01/13/2023   NA 137 01/14/2023   K 3.9 01/14/2023   CL 101 01/14/2023   CO2 29 01/14/2023   GLUCOSE 110 (H) 01/14/2023   BUN 10 01/14/2023   CREATININE 0.62 01/14/2023   BILITOT 0.5 08/09/2022   ALKPHOS 97 08/09/2022   AST 26 08/09/2022   ALT 22 08/09/2022   PROT 7.0 08/09/2022   ALBUMIN 2.9 (L) 08/09/2022   CALCIUM 9.1 01/14/2023   GFRAA 114 03/14/2020   QFTBGOLDPLUS Positive (A) 08/02/2020    Speciality Comments: No specialty comments available.  Procedures:  No procedures performed Allergies: Patient has no known allergies.   Assessment / Plan:     Visit Diagnoses: Psoriatic arthritis (HCC): No dactylitis noted on examination today.  No evidence of Achilles tendinitis or plantar fasciitis.  No SI joint tenderness upon palpation today.  Patient continues to have psoriasis on his scalp and he has requested a prescription for clobetasol 0.05% external solution to the pharmacy.  He will follow up in 5 months or sooner if needed.   Rheumatoid arthritis involving multiple sites with positive rheumatoid factor (HCC) - Severe disease: Thickening of all MCP, PIP, and DIP joints: Patient continues to have synovial thickening of all MCP joints with chronic inflammation in bilateral 5th MCP joints and left 2nd MCP joint.  She remains on Imuran 50 mg 3 tablets by  mouth daily, prednisone 5 mg 1 tablet daily, and Orencia 125 mg sq injections once weekly.  Overall his symptoms have improved since increasing the dose of Imuran.  He experiences intermittent discomfort in his hands and shoulders typically with weather changes.  He has been tolerating combination therapy without any side effects has not had any interruptions in therapy.  No medication changes will be made at this time.  He is vies notify us if he develops signs or symptoms of a flare.  High risk medication use - Imuran 50 mg 3 tablets by mouth daily, prednisone 5 mg daily, and orencia 125 mg sq injections once weekly.  CBC updated on 01/13/23. BMP updated on 01/14/23.  Hepatic function panel updated today.   No recent or recurrent infections.  Discussed the importance of holding orencia and imuran if he develops signs or symptoms of an infection and to resume once the infection has completely cleared.  CXR updated on 01/13/23 while hospitalized.  - Plan: Hepatic function panel  Psoriasis: Scalp-A prescription for clobetasol 0.05% external solution was sent to the pharmacy today.  Pulmonary fibrosis (HCC) - PFTs updated 05/16/2020.  Followed by Dr. Vassie Loll. High resolution chest CT updated on 09/20/21:  No significant change in moderate to severe pulmonary fibrosis. He has an upcoming visit on 03/11/22.   Positive QuantiFERON-TB Gold test - Dr. Renold Don- initiated INH/B6.  CXR updated on 01/13/23 while hospitalized.   Osteopenia of multiple sites: DEXA updated on 12/13/2022: Third left distal radius BMD 0.887 with T-score +1.3.  28% improvement in BMD of right total femur.  42% change in BMD for the left total femur. Normal.   He remain on prednisone 5 mg daily.   Plan to repeat DEXA in 2 years.  DEXA results reviewed with the patient today in the office and  all questions were addressed.  Other medical conditions are listed as follows:  History of congestive heart failure  History of  hyperlipidemia  History of bronchiectasis  History of gastroesophageal reflux (GERD)  History of coronary artery disease  History of atrial fibrillation  History of hypertension: Blood pressure was 130/69 today in the office.  Orders: Orders Placed This Encounter  Procedures   Hepatic function panel   Meds ordered this encounter  Medications   clobetasol (TEMOVATE) 0.05 % external solution    Sig: Apply 1 Application topically 2 (two) times daily.    Dispense:  50 mL    Refill:  0    Follow-Up Instructions: Return in about 5 months (around 06/24/2023) for Rheumatoid arthritis.   Gearldine Bienenstock, PA-C  Note - This record has been created using Dragon software.  Chart creation errors have been sought, but may not always  have been located. Such creation errors do not reflect on  the standard of medical care.

## 2023-01-13 ENCOUNTER — Emergency Department (HOSPITAL_COMMUNITY): Payer: Medicare HMO

## 2023-01-13 ENCOUNTER — Encounter (HOSPITAL_COMMUNITY): Payer: Self-pay

## 2023-01-13 ENCOUNTER — Observation Stay (HOSPITAL_COMMUNITY)
Admission: EM | Admit: 2023-01-13 | Discharge: 2023-01-14 | Disposition: A | Discharge status: Home / Self Care | Payer: Medicare HMO | Attending: Family Medicine | Admitting: Family Medicine

## 2023-01-13 ENCOUNTER — Other Ambulatory Visit: Payer: Self-pay

## 2023-01-13 ENCOUNTER — Other Ambulatory Visit: Payer: Self-pay | Admitting: Physician Assistant

## 2023-01-13 DIAGNOSIS — R079 Chest pain, unspecified: Principal | ICD-10-CM | POA: Diagnosis present

## 2023-01-13 DIAGNOSIS — L4050 Arthropathic psoriasis, unspecified: Secondary | ICD-10-CM

## 2023-01-13 DIAGNOSIS — Z7901 Long term (current) use of anticoagulants: Secondary | ICD-10-CM | POA: Diagnosis not present

## 2023-01-13 DIAGNOSIS — R0789 Other chest pain: Secondary | ICD-10-CM | POA: Diagnosis present

## 2023-01-13 DIAGNOSIS — M069 Rheumatoid arthritis, unspecified: Secondary | ICD-10-CM | POA: Diagnosis not present

## 2023-01-13 DIAGNOSIS — I5020 Unspecified systolic (congestive) heart failure: Secondary | ICD-10-CM | POA: Insufficient documentation

## 2023-01-13 DIAGNOSIS — I110 Hypertensive heart disease with heart failure: Secondary | ICD-10-CM | POA: Insufficient documentation

## 2023-01-13 DIAGNOSIS — L4052 Psoriatic arthritis mutilans: Secondary | ICD-10-CM | POA: Diagnosis not present

## 2023-01-13 DIAGNOSIS — Z720 Tobacco use: Secondary | ICD-10-CM | POA: Diagnosis not present

## 2023-01-13 DIAGNOSIS — Z8719 Personal history of other diseases of the digestive system: Secondary | ICD-10-CM | POA: Insufficient documentation

## 2023-01-13 DIAGNOSIS — I480 Paroxysmal atrial fibrillation: Secondary | ICD-10-CM | POA: Insufficient documentation

## 2023-01-13 DIAGNOSIS — Z79899 Other long term (current) drug therapy: Secondary | ICD-10-CM

## 2023-01-13 DIAGNOSIS — L409 Psoriasis, unspecified: Secondary | ICD-10-CM

## 2023-01-13 DIAGNOSIS — M0579 Rheumatoid arthritis with rheumatoid factor of multiple sites without organ or systems involvement: Secondary | ICD-10-CM

## 2023-01-13 DIAGNOSIS — K219 Gastro-esophageal reflux disease without esophagitis: Secondary | ICD-10-CM | POA: Diagnosis not present

## 2023-01-13 DIAGNOSIS — Z8679 Personal history of other diseases of the circulatory system: Secondary | ICD-10-CM | POA: Insufficient documentation

## 2023-01-13 DIAGNOSIS — E785 Hyperlipidemia, unspecified: Secondary | ICD-10-CM | POA: Insufficient documentation

## 2023-01-13 LAB — BASIC METABOLIC PANEL
Anion gap: 8 (ref 5–15)
BUN: 9 mg/dL (ref 8–23)
CO2: 28 mmol/L (ref 22–32)
Calcium: 9.1 mg/dL (ref 8.9–10.3)
Chloride: 102 mmol/L (ref 98–111)
Creatinine, Ser: 0.71 mg/dL (ref 0.61–1.24)
GFR, Estimated: >60 mL/min (ref >60)
Glucose, Bld: 97 mg/dL (ref 70–99)
Potassium: 4.6 mmol/L (ref 3.5–5.1)
Sodium: 138 mmol/L (ref 135–145)

## 2023-01-13 LAB — CBC
HCT: 40.5 % (ref 39.0–52.0)
Hemoglobin: 12.7 g/dL — ABNORMAL LOW (ref 13.0–17.0)
MCH: 27.7 pg (ref 26.0–34.0)
MCHC: 31.4 g/dL (ref 30.0–36.0)
MCV: 88.4 fL (ref 80.0–100.0)
Platelets: 324 10*3/uL (ref 150–400)
RBC: 4.58 MIL/uL (ref 4.22–5.81)
RDW: 15.9 % — ABNORMAL HIGH (ref 11.5–15.5)
WBC: 10.7 10*3/uL — ABNORMAL HIGH (ref 4.0–10.5)
nRBC: 0 % (ref 0.0–0.2)

## 2023-01-13 LAB — TROPONIN I (HIGH SENSITIVITY)
Troponin I (High Sensitivity): 10 ng/L (ref <18)
Troponin I (High Sensitivity): 9 ng/L (ref <18)

## 2023-01-13 MED ORDER — CLOPIDOGREL BISULFATE 75 MG PO TABS
75.0000 mg | ORAL_TABLET | Freq: Every day | ORAL | Status: DC
  Administered 2023-01-14: 75 mg via ORAL
  Filled 2023-01-13: qty 1

## 2023-01-13 MED ORDER — ACETAMINOPHEN 325 MG PO TABS: 650.0000 mg | ORAL_TABLET | Freq: Four times a day (QID) | ORAL | Status: DC | PRN

## 2023-01-13 MED ORDER — ACETAMINOPHEN 650 MG RE SUPP: 650.0000 mg | Freq: Four times a day (QID) | RECTAL | Status: DC | PRN

## 2023-01-13 MED ORDER — ATORVASTATIN CALCIUM 80 MG PO TABS
80.0000 mg | ORAL_TABLET | Freq: Every day | ORAL | Status: DC
  Administered 2023-01-14: 80 mg via ORAL
  Filled 2023-01-13: qty 1

## 2023-01-13 MED ORDER — PANTOPRAZOLE SODIUM 40 MG PO TBEC
40.0000 mg | DELAYED_RELEASE_TABLET | Freq: Two times a day (BID) | ORAL | Status: DC
  Administered 2023-01-13 – 2023-01-14 (×2): 40 mg via ORAL
  Filled 2023-01-13 (×2): qty 1

## 2023-01-13 MED ORDER — FUROSEMIDE 20 MG PO TABS
40.0000 mg | ORAL_TABLET | Freq: Two times a day (BID) | ORAL | Status: DC
  Administered 2023-01-13 – 2023-01-14 (×2): 40 mg via ORAL
  Filled 2023-01-13 (×2): qty 2

## 2023-01-13 MED ORDER — AZATHIOPRINE 50 MG PO TABS
100.0000 mg | ORAL_TABLET | Freq: Once | ORAL | Status: AC
  Administered 2023-01-13: 100 mg via ORAL
  Filled 2023-01-13 (×2): qty 2

## 2023-01-13 MED ORDER — AZATHIOPRINE 50 MG PO TABS
150.0000 mg | ORAL_TABLET | Freq: Every day | ORAL | Status: DC
  Administered 2023-01-14: 150 mg via ORAL
  Filled 2023-01-13: qty 3

## 2023-01-13 MED ORDER — SOTALOL HCL 120 MG PO TABS
120.0000 mg | ORAL_TABLET | Freq: Two times a day (BID) | ORAL | Status: DC
  Administered 2023-01-13 – 2023-01-14 (×2): 120 mg via ORAL
  Filled 2023-01-13 (×5): qty 1

## 2023-01-13 MED ORDER — APIXABAN 5 MG PO TABS
5.0000 mg | ORAL_TABLET | Freq: Two times a day (BID) | ORAL | Status: DC
  Administered 2023-01-13 – 2023-01-14 (×2): 5 mg via ORAL
  Filled 2023-01-13 (×2): qty 1

## 2023-01-13 MED ORDER — PREDNISONE 5 MG PO TABS
5.0000 mg | ORAL_TABLET | Freq: Every day | ORAL | Status: DC
  Administered 2023-01-14: 5 mg via ORAL
  Filled 2023-01-13: qty 1

## 2023-01-13 MED ORDER — AMLODIPINE BESYLATE 5 MG PO TABS
5.0000 mg | ORAL_TABLET | Freq: Every day | ORAL | Status: DC
  Administered 2023-01-14: 5 mg via ORAL
  Filled 2023-01-13: qty 1

## 2023-01-13 MED ORDER — LOSARTAN POTASSIUM 50 MG PO TABS
100.0000 mg | ORAL_TABLET | Freq: Every day | ORAL | Status: DC
Start: 2023-01-14 — End: 2023-01-14
  Administered 2023-01-14: 100 mg via ORAL
  Filled 2023-01-13: qty 2

## 2023-01-13 NOTE — Hospital Course (Addendum)
ADRIN Flores is a 67 y.o.male with a history of CAD, GERD, hypertension, ischemic cardiomyopathy, STEMI, latent TB, PAF, macular degeneration, HFrEF, psoriatic arthritis, rheumatoid arthritis who was admitted to the family medicine teaching Service at [facility] for ACS workup.  His hospital course is detailed below:  Atypical chest pain Patient described chest pain in epigastric/sternal area described as tightness.  ACS workup was unremarkable with unchanged EKG demonstrating known left bundle branch block and an elevated troponins.  Patient was admitted for observation and repeat echo before close follow-up with cardiology.  Chest x-ray showed known chronic interstitial lung disease.  Discussed case with Dr. Royann Shivers of cardiology who recommended safer discharge pending normal echocardiogram.  Prior to echo being performed, patient decided to leave AMA.  Provider discussed possible risks to which patient verbalized understanding and the left AGAINST MEDICAL ADVICE.  Patient was scheduled for cardiology follow-up.  Other chronic conditions were medically managed with home medications and formulary alternatives as necessary (IDA, hypertension, PAF, GERD, rheumatoid/psoriatic arthritis, CHF, history of STEMI, CAD)  PCP Follow-up Recommendations: Consider adjusting PPI if continued GERD symptoms Cardiology follow-up scheduled for early December. Consider chronic interstitial lung disease and sotalol use as possible cause of chest pain.

## 2023-01-13 NOTE — ED Triage Notes (Signed)
Pt c.o chest pain and sob when he moves or takes in a deep breath x 3 days

## 2023-01-13 NOTE — H&P (Cosign Needed Addendum)
Hospital Admission History and Physical Service Pager: [phone]  Patient name: Curtis Flores Medical record number: [id number] Date of Birth: 01/28/1956 Age: 67 y.o. Gender: male  Primary Care Provider: Eartha Inch, MD Consultants: none Code Status: FULL Preferred Emergency Contact: Maudie Flakes [phone]  Chief Complaint: chest pain  Assessment and Plan: ZAAKIR KIVI is a 67 y.o. 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  I do suspect chest pain that is midsternal/epigastric and worse in the mornings could be related to his GERD, he does have a moderate hiatal hernia.  Other differentials include ACS, PE, pneumonia, PUD, pancreatitis, costochondritis Low suspicion for ACS given normal troponins and EKG with no acute changes from prior. Still possible given HEART score. Unstable angina possible as well. Low suspicion for PE given Wells score 0 and has not missed any doses of Eliquis.  Patient is afebrile, chest x-ray with no focal infiltrate, only mild leukocytosis. Less likely pulmonary fibrosis flare/bronchiectasis given no increase in cough, hemoptysis, sputum production, shortness of breath or fevers. Although sometimes pleuritic component.  Lipase pending, less likely pancreatitis given no abdominal pain on exam.  Costochondritis or muscle strain possible but pain is not reproducible on exam.  EGD 2017 with some esophageal mucosal changes, medium sized hiatal hernia, erosive gastropathy. Patient asymptomatic on admission. Will admit for overnight observation given extensive cardiac history. Assessment & Plan Chest pain, unspecified type Chest pain described as tightness x 2 days in epigastric region, worse in the mornings.  Troponins trended flat.  CMP, CBC overall  unremarkable.  Chest x-ray with no acute changes.  EKG similar to prior.  Echo February 2024 with EF 35 to 40%. HEART score 7.  -Admitted to FMT[facility], Dr. Manson Passey attending, observation status -Vital signs per floor -Tylenol 650 mg every 6 hours as needed for pain -Continue home pantoprazole 40 mg twice daily -AM BMP, repeat AM EKG -Repeat troponin/serial EKG if more CP -Echo -Consider cardiology consult   Chronic and Stable Problems:  Iron Deficiency Anemia - stable, last colonoscopy 2017 (normal) Hypertension - continue amlodipine 5 mg daily, losartan 100 mg daily Paroxysmal A-fib - continue sotalol 120 mg q12h, Eliquis 5 mg twice daily  GERD-Home pantoprazole 40 mg twice daily Rheumatoid arthritis  Psoriatic arthritis - continue azathioprine 150 mg 3 times daily, prednisone 5 mg daily, hold Abatacept 125mg  - weekly (Fridays) HFmrEF, Grade 2 diastolic dysfunction (echo 04/2022)  - continue Lasix 40mg  BID Latent TB Infection-Treated by ID [id number] with isoniazid and pyridoxine.  Was cleared to resume immunologic. CADNSTEMISTEMI hx-Lateral STEMI 2016-2 stents, NSTEMI 05/11/2018 cath with multivessel disease with a 99% proximal in-stent restenosis within the circumflex stent as well as obtuse branch in-stent restenosis and moderate disease in the LAD and RCA. Went through angioplasty of proximal circumflex.  FEN/GI: heart healthy diet VTE Prophylaxis: on eliquis  Disposition: observation status, med-surg  History of Present Illness:  Curtis Flores is a 67 y.o. male with past medical history of CAD, GERD, hyperlipidemia, hypertension, STEMI, pulmonary fibrosis, the rest listed below who presented with intermittent chest tightness x 2 days.  Patient states that it is worse when he wakes up in the mornings and goes away as the day goes on.  Deep breaths worsen the pain.  Not worse with exertion.  Sitting down eventually alleviates the pain.  States that  the pain is midsternal around his upper abdomen  and does not radiate.  Reports history of STEMI in 2016 and states that this pain feels similar.  Wife reports that he may have worsened swelling in his bilateral legs.  Reports chronic cough, states that this is not worse than usual, denies hemoptysis or sputum production.  Denies shortness of breath, nausea, vomiting, diaphoresis. Has not missed any doses of his Eliquis. Follows with Dr. Allyson Sabal (cardiology) yearly  In the ED, pt had EKG non concerning for STEMI or acute pathology. Troponins trended flat. CXR with no acute findings. CMP unremarkable. CBC with chronic anemia Hgb 12.7, mild leukocytosis at 10.7   Review Of Systems: Per HPI with the following additions: none  Pertinent Past Medical History: CAD GERD HLD HTN Ischemic cardiomyopathy STEMI - 2016 Pulmonary fibrosis Psoriatic arthritis  Rheumatoid arthritis Latent TB PAF on eliquis Macular degeneration  Remainder reviewed in history tab.   Pertinent Past Surgical History: Cardiac cath - stents x2 Coronary balloon angioplasty 05/2018   Remainder reviewed in history tab.  Pertinent Social History: Tobacco use: Former-quit 20 years ago Alcohol use: None Other Substance use: None Lives with wife  Pertinent Family History: Passed   Remainder reviewed in history tab.   Important Outpatient Medications: Abatacept 125mg  - weekly (Fridays) Amlodipine 5mg  every day Eliquis 5mg  BID  Lipitor 80mg  every day  Azathioprine 150mg  Clobetasol-on scalp Plavix 75 mg daily Lasix 40 mg BID Losartan 100 dail Protonix 40 meq daily Prednisone 5 mg daily Sotalol 120 mg BID  Remainder reviewed in medication history.   Objective: BP (!) 189/81   Pulse 60   Temp (!) 97.5 F (36.4 C) (Oral)   Resp 13   Ht 5\' 7"  (1.702 m)   Wt 76.2 kg   SpO2 97%   BMI 26.31 kg/m  Exam: General: Well-appearing, no acute distress, eating dinner, alert responsive to all questions Eyes: EOMI ENTM: Moist mucous membranes Cardiovascular:  Regular rate and rhythm, well-perfused, no murmurs Respiratory: no increased work of breathing on room air, coarse breath sounds throughout, occasional expiratory wheeze Gastrointestinal: Normal bowel sounds, soft, nontender, nondistended MSK: No leg swelling bilateral lower extremities Neuro: Alert and oriented x 4 Psych: Normal affect  Labs:  CBC BMET  Recent Labs  Lab 01/13/23 1424  WBC 10.7*  HGB 12.7*  HCT 40.5  PLT 324   Recent Labs  Lab 01/13/23 1424  NA 138  K 4.6  CL 102  CO2 28  BUN 9  CREATININE 0.71  GLUCOSE 97  CALCIUM 9.1    Troponin 10>9  EKG: NSR at 57 BPM, ST elevation in V1 that appears unchanged from prior EKG. Qtc . Slightly widened QRS. LBBB   Imaging Studies Performed:  Chest x-ray IMPRESSION: *Essentially stable chronically increased interstitial markings, as described above. *Small left and trace right pleural effusions. No acute lung consolidation or lung collapse.  I independently reviewed and agree with radiologist's impression of imaging study. Increased interstitial markings, small right pleural effusion    Everhart, Kirstie, DO 01/13/2023, 10:07 PM PGY-1, [facility]  FPTS Intern pager: [phone], text pages welcome Secure chat group [facility] Teaching Service   Upper Level Addendum:  I have seen and evaluated this patient along with Dr. Rexene Alberts and reviewed the above note, making necessary revisions as appropriate.  I agree with the medical decision making and physical exam as noted above.  Levin Erp, MD PGY-3 [facility] Family Medicine Residency

## 2023-01-13 NOTE — Assessment & Plan Note (Addendum)
Chest pain described as tightness x 2 days in epigastric region, worse in the mornings.  Troponins trended flat.  CMP, CBC overall unremarkable.  Chest x-ray with no acute changes.  EKG similar to prior.  Echo February 2024 with EF 35 to 40%. -Admitted to FMTS, Dr. Manson Passey attending, observation status -Vital signs per floor -Tylenol 650 mg every 6 hours as needed for pain -Continue home pantoprazole 40 mg twice daily -AM BMP, repeat AM EKG -Repeat troponin/serial EKG if more CP

## 2023-01-13 NOTE — Telephone Encounter (Signed)
Last Fill: 10/28/2022   Next Visit: 01/24/2023   Last Visit: 08/16/2022   Dx: Rheumatoid arthritis involving multiple sites with positive rheumatoid factor (    Current Dose per office note on 08/16/2022: prednisone 5 mg 1 tablet daily.    Okay to refill Prednisone?

## 2023-01-13 NOTE — ED Provider Notes (Signed)
[facility] AT [facility] Provider Note   CSN: [id number] Arrival date & time: 01/13/23  1341     History  Chief Complaint  Patient presents with   Chest Pain    Curtis Flores is a 67 y.o. male.  67 year old male with history of CAD and stent placement presents with chest pain x 3 days.  States the pain is epigastric and is worse in the morning.  States he gradually resolved on its own.  States it also is exertional and better with rest.  No associated dizziness or diaphoresis.  No recent cough or congestion.  Has not using nitroglycerin tablets.  Currently pain-free       Home Medications Prior to Admission medications   Medication Sig Start Date End Date Taking? Authorizing Provider  Abatacept (ORENCIA CLICKJECT) 125 MG/ML SOAJ Inject 125 mg into the skin once a week. 12/31/22  Yes Gearldine Bienenstock, PA-C  amLODipine (NORVASC) 5 MG tablet TAKE 1 TABLET EVERY DAY (SCHEDULE OFFICE VISIT FOR FUTURE REFILLS) 01/15/22  Yes Runell Gess, MD  apixaban (ELIQUIS) 5 MG TABS tablet TAKE 1 TABLET TWICE DAILY 09/02/22  Yes Runell Gess, MD  atorvastatin (LIPITOR) 80 MG tablet TAKE 1 TABLET EVERY DAY AT 6 PM (APPOINTMENT IS NEEDED) 05/21/22  Yes Runell Gess, MD  azaTHIOprine (IMURAN) 50 MG tablet Take 3 tablets (150 mg total) by mouth daily. 10/08/22  Yes Gearldine Bienenstock, PA-C  Clobetasol Prop Emollient Base (CLOBETASOL PROPIONATE E) 0.05 % emollient cream APPLY TO AFFECTED AREA TWICE A DAY AS NEEDED 08/14/22  Yes Gearldine Bienenstock, PA-C  clopidogrel (PLAVIX) 75 MG tablet TAKE 1 TABLET EVERY DAY (SCHEDULE OFFICE VISIT FOR FUTURE REFILLS) 01/15/22  Yes Runell Gess, MD  furosemide (LASIX) 40 MG tablet TAKE 1 TABLET TWICE DAILY 06/17/22  Yes Runell Gess, MD  losartan (COZAAR) 100 MG tablet TAKE 1 TABLET EVERY DAY 05/21/22  Yes Runell Gess, MD  pantoprazole (PROTONIX) 40 MG tablet TAKE 1 TABLET TWICE DAILY 11/02/20  Yes Gelene Mink, NP   potassium chloride SA (KLOR-CON M) 20 MEQ tablet TAKE 2 TABLETS TWICE DAILY 10/31/22  Yes Runell Gess, MD  predniSONE (DELTASONE) 5 MG tablet TAKE 1 TABLET DAILY WITH BREAKFAST. 01/13/23  Yes Gearldine Bienenstock, PA-C  sotalol (BETAPACE) 120 MG tablet TAKE 1 TABLET EVERY 12 HOURS 10/31/22  Yes Runell Gess, MD      Allergies    Patient has no known allergies.    Review of Systems   Review of Systems  All other systems reviewed and are negative.   Physical Exam Updated Vital Signs BP (!) 184/78   Pulse (!) 56   Temp (!) 97.5 F (36.4 C) (Oral)   Resp (!) 24   Ht 1.702 m (5\' 7" )   Wt 76.2 kg   SpO2 96%   BMI 26.31 kg/m  Physical Exam Vitals and nursing note reviewed.  Constitutional:      General: He is not in acute distress.    Appearance: Normal appearance. He is well-developed. He is not toxic-appearing.  HENT:     Head: Normocephalic and atraumatic.  Eyes:     General: Lids are normal.     Conjunctiva/sclera: Conjunctivae normal.     Pupils: Pupils are equal, round, and reactive to light.  Neck:     Thyroid: No thyroid mass.     Trachea: No tracheal deviation.  Cardiovascular:     Rate  and Rhythm: Normal rate and regular rhythm.     Heart sounds: Normal heart sounds. No murmur heard.    No gallop.  Pulmonary:     Effort: Pulmonary effort is normal. No respiratory distress.     Breath sounds: Normal breath sounds. No stridor. No decreased breath sounds, wheezing, rhonchi or rales.  Abdominal:     General: There is no distension.     Palpations: Abdomen is soft.     Tenderness: There is no abdominal tenderness. There is no rebound.  Musculoskeletal:        General: No tenderness. Normal range of motion.     Cervical back: Normal range of motion and neck supple.  Skin:    General: Skin is warm and dry.     Findings: No abrasion or rash.  Neurological:     Mental Status: He is alert and oriented to person, place, and time. Mental status is at baseline.      GCS: GCS eye subscore is 4. GCS verbal subscore is 5. GCS motor subscore is 6.     Cranial Nerves: Cranial nerves are intact. No cranial nerve deficit.     Sensory: No sensory deficit.     Motor: Motor function is intact.  Psychiatric:        Attention and Perception: Attention normal.        Speech: Speech normal.        Behavior: Behavior normal.     ED Results / Procedures / Treatments   Labs (all labs ordered are listed, but only abnormal results are displayed) Labs Reviewed  CBC - Abnormal; Notable for the following components:      Result Value   WBC 10.7 (*)    Hemoglobin 12.7 (*)    RDW 15.9 (*)    All other components within normal limits  BASIC METABOLIC PANEL  TROPONIN I (HIGH SENSITIVITY)  TROPONIN I (HIGH SENSITIVITY)    EKG EKG Interpretation Date/Time:  Monday January 13 2023 14:04:45 EST Ventricular Rate:  57 PR Interval:  216 QRS Duration:  134 QT Interval:  436 QTC Calculation: 424 R Axis:   -5  Text Interpretation: Sinus bradycardia with sinus arrhythmia with 1st degree A-V block Left bundle branch block Abnormal ECG When compared with ECG of 30-Sep-2018 10:31, PREVIOUS ECG IS PRESENT Confirmed by Lorre Nick ([id number]) on 01/13/2023 5:31:18 PM  Radiology DG Chest 2 View  Result Date: 01/13/2023 CLINICAL DATA:  Chest pain. EXAM: CHEST - 2 VIEW COMPARISON:  08/15/2020. FINDINGS: Redemonstration of chronically increased interstitial markings, with relative sparing of right middle lung, essentially similar to the prior study. There is small left pleural effusion, which appears slightly increased since the prior study. There is trace right pleural effusion, new since the prior study. No acute lung consolidation or lung collapse. Stable cardio-mediastinal silhouette. No acute osseous abnormalities. The soft tissues are within normal limits. IMPRESSION: *Essentially stable chronically increased interstitial markings, as described above. *Small left and trace  right pleural effusions. No acute lung consolidation or lung collapse. Electronically Signed   By: Jules Schick M.D.   On: 01/13/2023 17:08    Procedures Procedures    Medications Ordered in ED Medications - No data to display  ED Course/ Medical Decision Making/ A&P                                 Medical Decision Making Amount and/or Complexity of Data  Reviewed Labs: ordered. Radiology: ordered.   Patient is EKG per interpretation shows slightly increased ST segments when compared to prior.  Patient is pain-free at this time.  His troponins are flat.  Do not think he is having ACS at this time.  Patient's chest x-ray interpretations shows no acute findings here.  Given patient's extensive cardiac history as well as her symptoms will recommend admit for observation.  The patient and family are agreeable to this        Final Clinical Impression(s) / ED Diagnoses Final diagnoses:  None    Rx / DC Orders ED Discharge Orders     None         Lorre Nick, MD 01/13/23 [phone]

## 2023-01-14 ENCOUNTER — Observation Stay (HOSPITAL_COMMUNITY): Payer: Medicare HMO

## 2023-01-14 DIAGNOSIS — R079 Chest pain, unspecified: Secondary | ICD-10-CM

## 2023-01-14 LAB — HIV ANTIBODY (ROUTINE TESTING W REFLEX): HIV Screen 4th Generation wRfx: NONREACTIVE

## 2023-01-14 LAB — ECHOCARDIOGRAM COMPLETE
AR max vel: 2.75 cm2
AV Area VTI: 2.63 cm2
AV Area mean vel: 2.58 cm2
AV Mean grad: 3 mm[Hg]
AV Peak grad: 6.2 mm[Hg]
Ao pk vel: 1.24 m/s
Area-P 1/2: 2.91 cm2
Calc EF: 36.2 %
Height: 67 in
S' Lateral: 4.3 cm
Single Plane A2C EF: 29.8 %
Single Plane A4C EF: 41.5 %
Weight: 2688 [oz_av]

## 2023-01-14 LAB — BASIC METABOLIC PANEL
Anion gap: 7 (ref 5–15)
BUN: 10 mg/dL (ref 8–23)
CO2: 29 mmol/L (ref 22–32)
Calcium: 9.1 mg/dL (ref 8.9–10.3)
Chloride: 101 mmol/L (ref 98–111)
Creatinine, Ser: 0.62 mg/dL (ref 0.61–1.24)
GFR, Estimated: >60 mL/min (ref >60)
Glucose, Bld: 110 mg/dL — ABNORMAL HIGH (ref 70–99)
Potassium: 3.9 mmol/L (ref 3.5–5.1)
Sodium: 137 mmol/L (ref 135–145)

## 2023-01-14 LAB — LIPASE, BLOOD: Lipase: 34 U/L (ref 11–51)

## 2023-01-14 NOTE — Plan of Care (Signed)
Patient RN that patient's son attempting to unhook from monitors father and leave the hospital.  MD to bedside.  Patient's son upset that father has been sitting for 24 hours in the ED waiting.  Explained that beds are not always available and that ECHO schedule is often very busy.  Patient is on still upset.  Saying that his father needs to leave.  When I asked the patient if he wanted to leave he verbalized yes.  Explained that the risk of leaving before ECHO was performed to rule out new cardiac wall motion abnormality and risk of MI.  Patient verbalized understanding.  Still wishes to leave AGAINST MEDICAL ADVICE.  Discussed with patient's RN who will have patient's sign AMA paperwork.  Will message Dr. Royann Shivers to hopefully schedule patient with close outpatient cardiology follow-up.

## 2023-01-14 NOTE — ED Notes (Signed)
Spoke to resident, he is aware that pt will be leaving AMA, pt signed AMA forms.

## 2023-01-14 NOTE — Discharge Instructions (Signed)
Dear Curtis Flores,   Thank you so much for allowing [place] to be part of your care!  You were admitted to [facility] for chest pain. Your workup was negative for a cardiac cause. This may be related to reflux.   POST-HOSPITAL & CARE INSTRUCTIONS Please let PCP/Specialists know of any changes that were made.  Please see medications section of this packet for any medication changes.   DOCTOR'S APPOINTMENT & FOLLOW UP CARE INSTRUCTIONS  Future Appointments  Date Time Provider Department Center  01/24/2023 10:10 AM Gearldine Bienenstock, PA-C [facility] None  03/12/2023 10:30 AM Oretha Milch, MD [facility]  03/25/2023  1:30 PM [facility] None  03/25/2023  2:30 PM Carnella Guadalajara, PA-C [facility] None  04/08/2023 11:30 AM Runell Gess, MD [facility] None    RETURN PRECAUTIONS:   Take care and be well!  Family Medicine Teaching Service  [place]  [facility]  [address] [place], [place] [address] [phone]

## 2023-01-14 NOTE — Discharge Summary (Signed)
Family Medicine Teaching [facility] [place] Discharge Summary  Patient name: Curtis Flores Medical record number: [id number] Date of birth: 1955/06/29 Age: 67 y.o. Gender: male Date of Admission: 01/13/2023  Date of Discharge: Patient Left AGAINST MEDICAL ADVICE 01/14/23 Admitting Physician: Para March, DO  Primary Care Provider: Eartha Inch, MD Consultants: Cardiology  Indication for Hospitalization: Concern for ACS  Discharge Diagnoses/Problem List:  Principal Problem for Admission: Concern for ACS Other Problems addressed during stay:  Principal Problem:   Chest pain    Brief Hospital Course:  Curtis Flores is a 67 y.o.male with a history of CAD, GERD, hypertension, ischemic cardiomyopathy, STEMI, latent TB, PAF, macular degeneration, HFrEF, psoriatic arthritis, rheumatoid arthritis who was admitted to the family medicine teaching Service at [facility] for ACS workup.  His hospital course is detailed below:  Atypical chest pain Patient described chest pain in epigastric/sternal area described as tightness.  ACS workup was unremarkable with unchanged EKG demonstrating known left bundle branch block and an elevated troponins.  Patient was admitted for observation and repeat echo before close follow-up with cardiology.  Chest x-ray showed known chronic interstitial lung disease.  Discussed case with Dr. Royann Shivers of cardiology who recommended safer discharge pending normal echocardiogram.  Prior to echo being performed, patient decided to leave AMA.  Provider discussed possible risks to which patient verbalized understanding and the left AGAINST MEDICAL ADVICE.  Patient was scheduled for cardiology follow-up.  Other chronic conditions were medically managed with home medications and formulary alternatives as necessary (IDA, hypertension, PAF, GERD, rheumatoid/psoriatic arthritis, CHF, history of STEMI, CAD)  PCP Follow-up Recommendations: Consider adjusting PPI if continued GERD  symptoms Cardiology follow-up scheduled for early December. Consider chronic interstitial lung disease and sotalol use as possible cause of chest pain.   Disposition: Home,  AMA  Discharge Condition: Stable  Discharge Exam:  Vitals:   01/14/23 1500 01/14/23 1635  BP: (!) 117/52 (!) 171/70  Pulse: 60 (!) 47  Resp: 19 (!) 21  Temp:  98 F (36.7 C)  SpO2: 97% 98%   Physical exam per Dr. Maryruth Bun in the a.m. of 01/14/2023 General: Sitting up in bed, awake and alert, in NAD Cardiovascular: RRR, normal S1/S2 Respiratory: CTAB, normal WOB on RA Abdomen: normoactive bowel sounds, soft, nontender, nondistended MSK: no tenderness to palpation of chest wall Extremities: no edema to BLE   Significant Procedures: None  Significant Labs and Imaging:  Recent Labs  Lab 01/13/23 1424  WBC 10.7*  HGB 12.7*  HCT 40.5  PLT 324   Recent Labs  Lab 01/13/23 1424 01/14/23 0030  NA 138 137  K 4.6 3.9  CL 102 101  CO2 28 29  GLUCOSE 97 110*  BUN 9 10  CREATININE 0.71 0.62  CALCIUM 9.1 9.1   Chest x-ray 01/13/2023 *Essentially stable chronically increased interstitial markings, as described above. *Small left and trace right pleural effusions. No acute lung consolidation or lung collapse.  Results/Tests Pending at Time of Discharge: None  Discharge Medications:  Allergies as of 01/14/2023   No Known Allergies      Medication List     TAKE these medications    amLODipine 5 MG tablet Commonly known as: NORVASC TAKE 1 TABLET EVERY DAY (SCHEDULE OFFICE VISIT FOR FUTURE REFILLS)   atorvastatin 80 MG tablet Commonly known as: LIPITOR TAKE 1 TABLET EVERY DAY AT 6 PM (APPOINTMENT IS NEEDED)   azaTHIOprine 50 MG tablet Commonly known as: IMURAN Take 3 tablets (150 mg total) by mouth daily.  Clobetasol Propionate E 0.05 % emollient cream Generic drug: Clobetasol Prop Emollient Base APPLY TO AFFECTED AREA TWICE A DAY AS NEEDED   clopidogrel 75 MG tablet Commonly known  as: PLAVIX TAKE 1 TABLET EVERY DAY (SCHEDULE OFFICE VISIT FOR FUTURE REFILLS)   Eliquis 5 MG Tabs tablet Generic drug: apixaban TAKE 1 TABLET TWICE DAILY   furosemide 40 MG tablet Commonly known as: LASIX TAKE 1 TABLET TWICE DAILY   losartan 100 MG tablet Commonly known as: COZAAR TAKE 1 TABLET EVERY DAY   Orencia ClickJect 125 MG/ML Soaj Generic drug: Abatacept Inject 125 mg into the skin once a week.   pantoprazole 40 MG tablet Commonly known as: PROTONIX TAKE 1 TABLET TWICE DAILY   potassium chloride SA 20 MEQ tablet Commonly known as: KLOR-CON M TAKE 2 TABLETS TWICE DAILY   predniSONE 5 MG tablet Commonly known as: DELTASONE TAKE 1 TABLET DAILY WITH BREAKFAST. What changed:  how much to take how to take this when to take this additional instructions   sotalol 120 MG tablet Commonly known as: BETAPACE TAKE 1 TABLET EVERY 12 HOURS        Discharge Instructions: Please refer to Patient Instructions section of EMR for full details.  Patient was counseled important signs and symptoms that should prompt return to medical care, changes in medications, dietary instructions, activity restrictions, and follow up appointments.   Follow-Up Appointments:  Follow-up Information     Eartha Inch, MD Follow up.   Specialty: Family Medicine Contact information: [address] [place] [address] [phone]                 Celine Mans, MD 01/14/2023, 6:57 PM PGY-2, [facility]

## 2023-01-14 NOTE — ED Notes (Signed)
This RN spoke with pt's daughter Liborio Nixon to update on current bed status.  Daughter appreciative of call and requesting call in morning if/when pt moves to a new room.  Liborio Nixon (Daughter): [phone]

## 2023-01-14 NOTE — Progress Notes (Signed)
Daily Progress Note Intern Pager: [phone]  Patient name: Curtis Flores Medical record number: [id number] Date of birth: December 03, 1955 Age: 67 y.o. Gender: male  Primary Care Provider: Eartha Inch, MD Consultants: none Code Status: full  Pt Overview and Major Events to Date:  11/11: admitted to FMTS  Assessment and Plan: Jadakiss Kump is a 67 year old male with hx of CAD, STEMI, HTN, GERD, PAF on Eliquis, HFmrEF who presented with chest pain. Admitted for observation given extensive cardiac history, pt is stable with ongoing chest pain. Etiology remains unclear but does not appear to be new ACS or PE, could be worsening GERD or pulmonary fibrosis. Pending echo today.  Other pertinent PMH/PSH includes RA, psoriatic arthritis, latent TB, pulmonary fibrosis Assessment & Plan Chest pain, unspecified type Troponins 10>9. EKG on admission with ST elevations in V1 that are decreased from prior (July 2020), repeat EKG this AM remains stable. CXR with small BL pleural effusions. Continues to have mild, tight epigastric pain, slightly worse with deep inspiration. Has not required pain medications. --continuous cardiac monitoring --echo today --curbside cardiology to review EKG --Tylenol 650mg  q6h PRN --home pantoprazole 40mg  BID   Chronic and Stable Issues: Iron Deficiency Anemia - stable (Hbg 12.7 on admit) , last colonoscopy 2017 (normal) Hypertension - continue amlodipine 5 mg daily, losartan 100 mg daily Paroxysmal A-fib - continue sotalol 120 mg q12h, Eliquis 5 mg twice daily  GERD- home PPI as above Rheumatoid arthritis  Psoriatic arthritis - continue azathioprine 150 mg 3 times daily, prednisone 5 mg daily, hold Abatacept 125mg  - weekly (Fridays) HFmrEF, Grade 2 diastolic dysfunction (echo 04/2022)  - continue Lasix 40mg  BID Latent TB Infection-Treated by ID [id number] with isoniazid and pyridoxine.  Was cleared to resume immunologic. CADNSTEMISTEMI hx-Lateral STEMI 2016-2 stents,  NSTEMI 05/11/2018 cath with multivessel disease with a 99% proximal in-stent restenosis within the circumflex stent as well as obtuse branch in-stent restenosis and moderate disease in the LAD and RCA. Went through angioplasty of proximal circumflex.    FEN/GI: heart healthy PPx: Eliquis Dispo: pending clinical improvement  Subjective:  Pt states he still has pain, pointing to the base of the ribcage centrally. Describes the pain as tight and states it is slightly worse with deep breathing. Denies radiation of pain elsewhere. Denies acid reflux. States he has been taking all of his medications daily.  Objective: Temp:  [97.5 F (36.4 C)-99 F (37.2 C)] 98.1 F (36.7 C) (11/12 0500) Pulse Rate:  [51-66] 66 (11/12 0500) Resp:  [12-24] 16 (11/12 0500) BP: (127-189)/(62-82) 127/62 (11/12 0500) SpO2:  [96 %-100 %] 99 % (11/12 0500) Weight:  [76.2 kg] 76.2 kg (11/11 1410) Physical Exam: General: Sitting up in bed, awake and alert, in NAD Cardiovascular: RRR, normal S1/S2 Respiratory: CTAB, normal WOB on RA Abdomen: normoactive bowel sounds, soft, nontender, nondistended MSK: no tenderness to palpation of chest wall Extremities: no edema to BLE  Laboratory: Most recent CBC Lab Results  Component Value Date   WBC 10.7 (H) 01/13/2023   HGB 12.7 (L) 01/13/2023   HCT 40.5 01/13/2023   MCV 88.4 01/13/2023   PLT 324 01/13/2023   Most recent BMP    Latest Ref Rng & Units 01/14/2023   12:30 AM  BMP  Glucose 70 - 99 mg/dL 664   BUN 8 - 23 mg/dL 10   Creatinine 4.03 - 1.24 mg/dL 4.74   Sodium 259 - 563 mmol/L 137   Potassium 3.5 - 5.1 mmol/L 3.9  Chloride 98 - 111 mmol/L 101   CO2 22 - 32 mmol/L 29   Calcium 8.9 - 10.3 mg/dL 9.1     Aydia Maj, Tacey Ruiz, MD 01/14/2023, 7:30 AM  PGY-1, [facility] FPTS Intern pager: [phone], text pages welcome Secure chat group [facility] Teaching Service

## 2023-01-14 NOTE — ED Notes (Signed)
Family members are requesting that pt be unhooked from the monitor, they stated that they will take him home and that we can call them with the results, stated that they have been here since 0100 yesterday morning and that if there is an emergency they will bring him somewhere else. This RN has asked pts family member to wait until we can contact provider.

## 2023-01-14 NOTE — Assessment & Plan Note (Addendum)
Troponins 10>9. EKG on admission with ST elevations in V1 that are decreased from prior (July 2020), repeat EKG this AM remains stable. CXR with small BL pleural effusions. Continues to have mild, tight epigastric pain, slightly worse with deep inspiration. Has not required pain medications. --continuous cardiac monitoring --echo today --curbside cardiology to review EKG --Tylenol 650mg  q6h PRN --home pantoprazole 40mg  BID

## 2023-01-14 NOTE — ED Notes (Signed)
Admitting MD at bedside.

## 2023-01-14 NOTE — ED Notes (Signed)
This RN called and updated pt's daughter Liborio Nixon.

## 2023-01-16 ENCOUNTER — Other Ambulatory Visit: Payer: Self-pay | Admitting: *Deleted

## 2023-01-16 MED ORDER — AZATHIOPRINE 50 MG PO TABS: 150.0000 mg | ORAL_TABLET | Freq: Every day | ORAL | 0 refills | Status: DC

## 2023-01-16 NOTE — Telephone Encounter (Signed)
Refill request received via fax from [facility] for Imuran  Last Fill: 10/08/2022  Labs: 01/14/2023 Glucose 110, WBC 10.7, Hgb 12.7, RDW 15.9  Next Visit: 01/24/2023  Last Visit: 08/16/2022  DX: Rheumatoid arthritis involving multiple sites with positive rheumatoid factor   Current Dose per office note 08/16/2022: Imuran 50 mg 3 tablets by mouth daily   Okay to refill Imuran?

## 2023-01-17 ENCOUNTER — Ambulatory Visit: Payer: Medicare HMO | Admitting: Physician Assistant

## 2023-01-24 ENCOUNTER — Encounter: Payer: Self-pay | Admitting: Physician Assistant

## 2023-01-24 ENCOUNTER — Ambulatory Visit: Payer: Medicare HMO | Attending: Physician Assistant | Admitting: Physician Assistant

## 2023-01-24 VITALS — BP 130/69 | HR 51 | Resp 15 | Ht 67.0 in | Wt 174.2 lb

## 2023-01-24 DIAGNOSIS — L409 Psoriasis, unspecified: Secondary | ICD-10-CM

## 2023-01-24 DIAGNOSIS — Z8639 Personal history of other endocrine, nutritional and metabolic disease: Secondary | ICD-10-CM

## 2023-01-24 DIAGNOSIS — M8589 Other specified disorders of bone density and structure, multiple sites: Secondary | ICD-10-CM

## 2023-01-24 DIAGNOSIS — Z79899 Other long term (current) drug therapy: Secondary | ICD-10-CM | POA: Diagnosis not present

## 2023-01-24 DIAGNOSIS — M0579 Rheumatoid arthritis with rheumatoid factor of multiple sites without organ or systems involvement: Secondary | ICD-10-CM | POA: Diagnosis not present

## 2023-01-24 DIAGNOSIS — Z8719 Personal history of other diseases of the digestive system: Secondary | ICD-10-CM

## 2023-01-24 DIAGNOSIS — J8410 Pulmonary fibrosis, unspecified: Secondary | ICD-10-CM

## 2023-01-24 DIAGNOSIS — Z8679 Personal history of other diseases of the circulatory system: Secondary | ICD-10-CM

## 2023-01-24 DIAGNOSIS — L4050 Arthropathic psoriasis, unspecified: Secondary | ICD-10-CM

## 2023-01-24 DIAGNOSIS — Z8709 Personal history of other diseases of the respiratory system: Secondary | ICD-10-CM

## 2023-01-24 DIAGNOSIS — R7612 Nonspecific reaction to cell mediated immunity measurement of gamma interferon antigen response without active tuberculosis: Secondary | ICD-10-CM

## 2023-01-24 MED ORDER — CLOBETASOL PROPIONATE 0.05 % EX SOLN: 1.0000 | Freq: Two times a day (BID) | CUTANEOUS | 0 refills | Status: DC

## 2023-01-24 NOTE — Patient Instructions (Signed)

## 2023-01-25 LAB — HEPATIC FUNCTION PANEL
AG Ratio: 1 (calc) (ref 1.0–2.5)
ALT: 26 U/L (ref 9–46)
AST: 27 U/L (ref 10–35)
Albumin: 3 g/dL — ABNORMAL LOW (ref 3.6–5.1)
Alkaline phosphatase (APISO): 155 U/L — ABNORMAL HIGH (ref 35–144)
Bilirubin, Direct: 0.1 mg/dL (ref 0.0–0.2)
Globulin: 3.1 g/dL (ref 1.9–3.7)
Indirect Bilirubin: 0.2 mg/dL (ref 0.2–1.2)
Total Bilirubin: 0.3 mg/dL (ref 0.2–1.2)
Total Protein: 6.1 g/dL (ref 6.1–8.1)

## 2023-01-27 NOTE — Progress Notes (Signed)
Albumin is low-3.0.  Alk phos is borderline elevated-155.  Rest of hepatic function panel WNL.   We will continue to monitor.  AST and ALT  WNL.

## 2023-02-10 ENCOUNTER — Ambulatory Visit: Payer: Medicare HMO | Admitting: Cardiovascular Disease

## 2023-03-12 ENCOUNTER — Encounter (HOSPITAL_BASED_OUTPATIENT_CLINIC_OR_DEPARTMENT_OTHER): Payer: Self-pay | Admitting: Pulmonary Disease

## 2023-03-12 ENCOUNTER — Telehealth: Payer: Self-pay | Admitting: Pulmonary Disease

## 2023-03-12 ENCOUNTER — Ambulatory Visit (HOSPITAL_BASED_OUTPATIENT_CLINIC_OR_DEPARTMENT_OTHER): Payer: Medicare HMO | Admitting: Pulmonary Disease

## 2023-03-12 VITALS — BP 106/54 | HR 60 | Resp 16 | Ht 67.0 in | Wt 166.9 lb

## 2023-03-12 DIAGNOSIS — J849 Interstitial pulmonary disease, unspecified: Secondary | ICD-10-CM | POA: Diagnosis not present

## 2023-03-12 MED ORDER — ALBUTEROL SULFATE HFA 108 (90 BASE) MCG/ACT IN AERS: 2.0000 | INHALATION_SPRAY | Freq: Four times a day (QID) | RESPIRATORY_TRACT | 6 refills | Status: DC | PRN

## 2023-03-12 NOTE — Patient Instructions (Signed)
 x ambulatory saturation  xRx for albuterol MDI 2 puffs every 6 hours as needed for chest tightness or shortness of breath  x high-resolution CT chest x PFTs

## 2023-03-12 NOTE — Telephone Encounter (Signed)
 Patient had an appointment today but needs medication (albuterol inhaler) to be sent to Pharm[facility] in [place]

## 2023-03-12 NOTE — Progress Notes (Signed)
 Subjective:    Patient ID: Curtis Flores, male    DOB: May 16, 1955, 68 y.o.   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  History of Present Illness   The patient, with a history of fibrotic interstitial lung disease (ILD) and arthritis, presents with a new onset of shortness of breath and a dry cough. He reports feeling 'a little bit short winded' and has noticed these symptoms for 'a little while.' He also mentions that he has been less active recently, spending most of his time indoors due to cold weather.  The patient's arthritis symptoms have been steady, with morning stiffness being the most challenging. He is currently on Imuran , Orencia , and low-dose prednisone  for his conditions. The patient reports that his fingers are 'moving better' and overall, he is 'doing pretty good.'      Does not desaturate on ambulation  Significant tests/ events reviewed 2012 -  presented with a symmetric polyarthritis of elbows, wrists, hands, neck, shoulders & feet   CXR at [facility] showed bibasal interstitial fibrosis with hilar lymphadenopathy  With POS  RA 89 &  CCP 122, ANA neg, HLA B 27 neg, CK 87   PPD neg, hep panel neg     HRCT chest 09/2021 unchanged fibrosis, alternative pattern CT chest  w con 09/2020 no sig change in fibrosis HRCT 12/2019 >> favor fibrotic NSIP vs probable UIP, honeycombing +, minimal progression from 2017, mod hiatal hernia   HRCT 05/2015-  showed ILD/NSIP , slight progression compared from 2013.      PFTs 2013 showed intraprenchymal restricton with FVC around 71% &  DLCO 56% c/w ILD, no airway obstruction.   Rpt PFTs 10/2012 show mild drop in FVC to 66%, DLCO preserved     PFT 05/2015 FEV1 70%, ratio 82, FVC 66%, no BD response, DLCO 46%. DLCO has decreased from 2014.    PFT 12/2015 >> FVC 71%, DLCO 45% unc   PFTs 03/2018 show drop in FVC to 53%, TLC to 49%, DLCO maintained at 41%   PFTs 06/2019 moderate restriction, ratio 83, FVC 53%, DLCO stable at 11.8/28%   PFTs 05/2020 stabl, FVC 1.93/57%, TLC 55%, DLCO 11.50 /48 %  Review of Systems neg for any significant sore throat, dysphagia, itching, sneezing, nasal congestion or excess/ purulent secretions, fever, chills, sweats, unintended wt loss, pleuritic or exertional cp, hempoptysis, orthopnea pnd or change in chronic leg swelling. Also denies presyncope, palpitations, heartburn, abdominal pain, nausea, vomiting, diarrhea or change in bowel or urinary habits, dysuria,hematuria, rash, arthralgias, visual complaints, headache, numbness weakness or ataxia.     Objective:   Physical Exam  Gen. Pleasant, well-nourished, in no distress ENT - no thrush, no pallor/icterus,no post nasal drip Neck: No JVD, no thyromegaly, no carotid bruits Lungs: no use of accessory muscles, no dullness to percussion, BB rales no rhonchi  Cardiovascular: Rhythm regular, heart sounds  normal, no murmurs or gallops, no peripheral edema Musculoskeletal: hand deformities, no cyanosis or clubbing        Assessment & Plan:  Assessment and Plan    Fibrotic Interstitial Lung Disease (ILD) Presents with fibrotic ILD, managed with Imuran , Orencia , and low-dose prednisone . Reports increased dyspnea and dry cough, possibly due to inactivity or cold exposure. Stable for years; reassessment with updated imaging and pulmonary function tests planned. Discussed potential benefits of inhaler for symptomatic relief and monitoring oxygen levels during ambulation. - Order chest HRCT scan - Order pulmonary function test - Trial of simple  asthma inhaler for symptomatic relief - Monitor oxygen levels during ambulation  Rheumatoid Arthritis Arthritis is well-managed with occasional morning stiffness. Currently on 5 mg prednisone , 150 mg Imuran , and Orencia . - Continue prednisone  5 mg, Imuran  150 mg, Orencia   General Health Maintenance Approaching 68th birthday. Advised to avoid cold weather and increase physical activity as feasible. - Encourage regular physical activity as tolerated - Advise to avoid cold weather exposure  Follow-up - Review test results and follow up.

## 2023-03-13 NOTE — Telephone Encounter (Signed)
 Done

## 2023-03-24 NOTE — Progress Notes (Unsigned)
[facility] [address], [place] [address]   CLINIC:  Medical Oncology/Hematology  PCP:  Eartha Inch, MD [address] [place] [address] [phone]   REASON FOR VISIT:  Follow-up for leukocytosis with neutrophilia and monocytosis  CURRENT THERAPY: Surveillance  INTERVAL HISTORY:   [name] 68 y.o. male returns for routine follow-up of leukocytosis.  He was last seen by Dr. Ellin Saba on 03/26/2022.  At today's visit, he reports feeling fair.  No recent hospitalizations, surgeries, or changes in baseline health status.  He denies any recent infections or antibiotics.  He does not smoke or vape.  He denies any unexplained fever, chills, or night sweats.  He has not noticed any masses or lymphadenopathy.  PMH significant for rheumatoid arthritis and psoriasis/psoriatic arthritis, as well as steroid use including clobetasol cream as needed and daily 5 mg prednisone.  Patient does note episode of chest pain yesterday described as "pressure and tightness," which resolved as of this morning.  Patient reports similar episode a few months ago, and also reports that pain feels similar to his previous heart attacks.  He reports emotional stress as inciting factor prior to chest pain yesterday.  Patient did not want to go to ED last night since he was coming to hematology clinic today.  He refuses ED visit today due to chest pain having resolved.  He has visit scheduled with cardiologist (Dr. Allyson Sabal) next month.  He has 50% energy and 100% appetite. He reports that he has been eating less over the past year and has lost about 30 pounds gradually in the setting of altered eating habits.  ASSESSMENT & PLAN:  1.  Leukocytosis (reactive) - He has been followed by our clinic for leukocytosis since 2013 - WBC persistently elevated since at least 2013, overall stable between 12.0 to 20.0, with some outliers. - Bone marrow biopsy (04/09/2014):  Normocellular bone marrow for age with trilineage hematopoiesis.  Few small lymphoid aggregates present.  Increased iron stores. - Flow cytometry was negative.  Lymphocytes with no aberrant phenotype. - JAK2 V617F, CALR, MPL, Exons 12-15 and BCR/ABL were NEGATIVE. - PMH significant for rheumatoid arthritis and psoriasis, as well as other conditions noted below. - Steroid use includes clobetasol cream PRN and daily prednisone tablet (5 mg) - He does not smoke or vape - Most recent labs (03/25/2023): WBC 8.4 with normal differential.  CMP unremarkable.  LDH mildly elevated at 205. - No B symptoms or infections in the last 1 year. - Physical exam today (03/25/2023) with no palpable adenopathy or splenomegaly. - PLAN: Patient has CHRONIC REACTIVE LEUKOCYTOSIS in the setting of chronic steroid use and inflammatory disease.  No evidence of malignant leukocytosis with comprehensive workup as noted above. - Offered to discharge patient to PCP, but he prefers [facility].  2.  Chest pain - Patient reported episode of chest pain yesterday described as "pressure and tightness," which resolved as of this morning.  Patient reports similar episode a few months ago, and also reports that pain feels similar to his previous heart attacks.  He reports emotional stress as inciting factor prior to chest pain yesterday.  Patient did not want to go to ED last night since he was coming to hematology clinic today.  He refuses ED visit today due to chest pain having resolved.  He has visit scheduled with cardiologist (Dr. Allyson Sabal) next month. - PLAN: Patient advised to go to ED today (refused). -  Patient given STRICT instructions to return to ED if he has any recurrent chest pain in the future. - Message sent to patient cardiologist to inform him of patient's symptoms  3.  Other history - PMH includes rheumatoid arthritis (currently taking prednisone, Orencia, Imuran), pulmonary fibrosis, psoriasis,  hypertension, hyperlipidemia, systolic LV dysfunction and ischemic cardiomyopathy, history MI, atrial flutter.  History of lymphadenopathy in the mediastinum/hilar area consistent with being reactive to interstitial lung disease  PLAN SUMMARY: >> Same-day labs (CBC/D, LDH) + OFFICE visit in 1 year     REVIEW OF SYSTEMS:   Review of Systems  Constitutional:  Positive for fatigue. Negative for appetite change, chills, diaphoresis, fever and unexpected weight change.  HENT:   Negative for lump/mass and nosebleeds.   Eyes:  Negative for eye problems.  Respiratory:  Positive for cough and shortness of breath. Negative for hemoptysis.   Cardiovascular:  Positive for chest pain. Negative for leg swelling and palpitations.  Gastrointestinal:  Negative for abdominal pain, blood in stool, constipation, diarrhea, nausea and vomiting.  Genitourinary:  Negative for hematuria.   Skin: Negative.   Neurological:  Positive for dizziness. Negative for headaches and light-headedness.  Hematological:  Does not bruise/bleed easily.     PHYSICAL EXAM:  ECOG PERFORMANCE STATUS: 1 - Symptomatic but completely ambulatory  Vitals:   03/25/23 1427  BP: 124/74  Pulse: (!) 51  Resp: 18  Temp: 98.1 F (36.7 C)  SpO2: 95%   Filed Weights   03/25/23 1427  Weight: 165 lb 3.2 oz (74.9 kg)   Physical Exam Constitutional:      Appearance: Normal appearance. He is normal weight.  Cardiovascular:     Heart sounds: Normal heart sounds.  Pulmonary:     Breath sounds: Normal breath sounds.  Neurological:     General: No focal deficit present.     Mental Status: Mental status is at baseline.  Psychiatric:        Behavior: Behavior normal. Behavior is cooperative.     PAST MEDICAL/SURGICAL HISTORY:  Past Medical History:  Diagnosis Date   Abnormal CT scan, stomach    Chronic systolic dysfunction of left ventricle    Coronary artery disease    lateral STEMI 02/22/15 3/10 cutting balloon to ISR of the  o/pLCX   GERD (gastroesophageal reflux disease)    Hyperlipidemia    Hypertension    Ischemic cardiomyopathy    LBBB (left bundle branch block)    Leukocytosis    followed by hematology, reactive   Lymphadenopathy    Myocardial infarction (HCC) 02/2015   Psoriasis 2003   Psoriatic arthritis (HCC)    Pulmonary fibrosis (HCC)    Rheumatoid arthritis(714.0) 2012   Typical atrial flutter (HCC)    Past Surgical History:  Procedure Laterality Date   CARDIAC CATHETERIZATION N/A 02/22/2015   Procedure: Left Heart Cath and Coronary Angiography;  Surgeon: Runell Gess, MD;  Location: [facility] INVASIVE CV LAB;  Service: Cardiovascular;  Laterality: N/A;   CARDIAC CATHETERIZATION N/A 02/22/2015   Procedure: Coronary Stent Intervention;  Surgeon: Runell Gess, MD;  Location: [facility] INVASIVE CV LAB;  Service: Cardiovascular;  Laterality: N/A;   CARDIOVERSION N/A 04/12/2015   Procedure: CARDIOVERSION;  Surgeon: Laurey Morale, MD;  Location: [facility] ENDOSCOPY;  Service: Cardiovascular;  Laterality: N/A;   COLONOSCOPY  07/01/2003   SAY:TKZSWF colonic mucosa except for the proximal right colon in the area of ICV which was not seen completely due to inadequate bowel prep. followed with ACBE which was  normal.    COLONOSCOPY N/A 08/24/2015   Dr. Jena Gauss: Normal colon. Next colonoscopy in 10 years.   CORONARY BALLOON ANGIOPLASTY N/A 05/12/2018   Procedure: CORONARY BALLOON ANGIOPLASTY;  Surgeon: Lyn Records, MD;  [facility]VASIVE CV LAB;  Service: Cardiovascular;  Laterality: N/A;   ELECTROPHYSIOLOGIC STUDY N/A 05/30/2015   Atrial fibrillation ablation by Dr Johney Frame   ESOPHAGOGASTRODUODENOSCOPY N/A 08/24/2015   Dr. Jena Gauss: Medium-sized hiatal hernia, erosive gastropathy. Cameron lesions. Esophageal mucosa distally suggestive of short segment Barrett's esophagus. Not confirmed on biopsy. Gastric biopsy with minimal chronic inflammation   GIVENS CAPSULE STUDY N/A 04/17/2016   Procedure: GIVENS CAPSULE STUDY;   Surgeon: Corbin Ade, MD;  Location: [facility] ENDO SUITE;  Service: Endoscopy;  Laterality: N/A;  Pt to arrive at 8:00 am for 8:30 am appt   LEFT HEART CATH AND CORONARY ANGIOGRAPHY N/A 05/11/2018   Procedure: LEFT HEART CATH AND CORONARY ANGIOGRAPHY;  Surgeon: Lyn Records, MD;  Location: [facility] INVASIVE CV LAB;  Service: Cardiovascular;  Laterality: N/A;   TEE WITHOUT CARDIOVERSION N/A 04/12/2015   Procedure: TRANSESOPHAGEAL ECHOCARDIOGRAM (TEE);  Surgeon: Laurey Morale, MD;  Location: [facility] ENDOSCOPY;  Service: Cardiovascular;  Laterality: N/A;    SOCIAL HISTORY:  Social History   Socioeconomic History   Marital status: Married    Spouse name: Not on file   Number of children: 2   Years of education: Not on file   Highest education level: Not on file  Occupational History   Occupation: unemployed    Comment: not working do to arthritis; used to be Production designer, theatre/television/film for a [employer], systems  Tobacco Use   Smoking status: Former    Current packs/day: 0.00    Average packs/day: 1 pack/day for 30.0 years (30.0 ttl pk-yrs)    Types: Cigarettes    Start date: 03/20/1973    Quit date: 03/21/2003    Years since quitting: 20.0    Passive exposure: Past   Smokeless tobacco: Never  Vaping Use   Vaping status: Never Used  Substance and Sexual Activity   Alcohol use: No    Alcohol/week: 0.0 standard drinks of alcohol    Comment: H/O case of beer weekly x 20 years, quiting in 2000-ish.   Drug use: No    Comment: H/O marijuana use many years ago.   Sexual activity: Yes    Birth control/protection: None  Other Topics Concern   Not on file  Social History Narrative   Not on file   Social Drivers of Health   Financial Resource Strain: Medium Risk (04/10/2022)   Received from [facility], [facility]   Overall Financial Resource Strain (CARDIA)    Difficulty of Paying Living Expenses: Somewhat hard  Food Insecurity: No Food Insecurity (01/13/2023)   Hunger Vital Sign    Worried About Running  Out of Food in the Last Year: Never true    Ran Out of Food in the Last Year: Never true  Transportation Needs: No Transportation Needs (01/13/2023)   PRAPARE - Administrator, Civil Service (Medical): No    Lack of Transportation (Non-Medical): No  Physical Activity: Insufficiently Active (10/17/2020)   Received from [facility], [facility]   Exercise Vital Sign    Days of Exercise per Week: 3 days    Minutes of Exercise per Session: 20 min  Stress: No Stress Concern Present (10/17/2020)   Received from [facility], [facility] - Occupational Stress  Questionnaire    Feeling of Stress : Not at all  Social Connections: Unknown (07/05/2021)   Received from [facility], [facility]   Social Network    Social Network: Not on file  Intimate Partner Violence: Not At Risk (01/13/2023)   Humiliation, Afraid, Rape, and Kick questionnaire    Fear of Current or Ex-Partner: No    Emotionally Abused: No    Physically Abused: No    Sexually Abused: No    FAMILY HISTORY:  Family History  Problem Relation Age of Onset   Hypertension Mother    Colon cancer Neg Hx     CURRENT MEDICATIONS:  Outpatient Encounter Medications as of 03/25/2023  Medication Sig   Abatacept (ORENCIA CLICKJECT) 125 MG/ML SOAJ Inject 125 mg into the skin once a week.   albuterol (VENTOLIN HFA) 108 (90 Base) MCG/ACT inhaler Inhale 2 puffs into the lungs every 6 (six) hours as needed for wheezing or shortness of breath.   amLODipine (NORVASC) 5 MG tablet TAKE 1 TABLET EVERY DAY (SCHEDULE OFFICE VISIT FOR FUTURE REFILLS)   apixaban (ELIQUIS) 5 MG TABS tablet TAKE 1 TABLET TWICE DAILY   atorvastatin (LIPITOR) 80 MG tablet TAKE 1 TABLET EVERY DAY AT 6 PM (APPOINTMENT IS NEEDED)   azaTHIOprine (IMURAN) 50 MG tablet Take 3 tablets (150 mg total) by mouth daily.   clobetasol (TEMOVATE) 0.05 % external solution Apply 1 Application topically 2 (two) times daily.    Clobetasol Prop Emollient Base (CLOBETASOL PROPIONATE E) 0.05 % emollient cream APPLY TO AFFECTED AREA TWICE A DAY AS NEEDED   clopidogrel (PLAVIX) 75 MG tablet TAKE 1 TABLET EVERY DAY (SCHEDULE OFFICE VISIT FOR FUTURE REFILLS)   furosemide (LASIX) 40 MG tablet TAKE 1 TABLET TWICE DAILY   losartan (COZAAR) 100 MG tablet TAKE 1 TABLET EVERY DAY   pantoprazole (PROTONIX) 40 MG tablet TAKE 1 TABLET TWICE DAILY   potassium chloride SA (KLOR-CON M) 20 MEQ tablet TAKE 2 TABLETS TWICE DAILY   predniSONE (DELTASONE) 5 MG tablet TAKE 1 TABLET DAILY WITH BREAKFAST.   sotalol (BETAPACE) 120 MG tablet TAKE 1 TABLET EVERY 12 HOURS   No facility-administered encounter medications on file as of 03/25/2023.    ALLERGIES:  No Known Allergies  LABORATORY DATA:  I have reviewed the labs as listed.  CBC    Component Value Date/Time   WBC 8.4 03/25/2023 1307   RBC 4.67 03/25/2023 1307   HGB 13.1 03/25/2023 1307   HGB 14.5 08/12/2011 1256   HCT 41.3 03/25/2023 1307   HCT 44.7 08/12/2011 1256   PLT 307 03/25/2023 1307   PLT 240 08/12/2011 1256   MCV 88.4 03/25/2023 1307   MCV 82.2 08/12/2011 1256   MCH 28.1 03/25/2023 1307   MCHC 31.7 03/25/2023 1307   RDW 16.0 (H) 03/25/2023 1307   RDW 14.7 (H) 08/12/2011 1256   LYMPHSABS 1.6 03/25/2023 1307   LYMPHSABS 5.5 (H) 08/12/2011 1256   MONOABS 0.8 03/25/2023 1307   MONOABS 1.6 (H) 08/12/2011 1256   EOSABS 0.0 03/25/2023 1307   EOSABS 0.2 08/12/2011 1256   BASOSABS 0.1 03/25/2023 1307   BASOSABS 0.1 08/12/2011 1256      Latest Ref Rng & Units 03/25/2023    1:07 PM 01/24/2023   10:22 AM 01/14/2023   12:30 AM  CMP  Glucose 70 - 99 mg/dL 478   295   BUN 8 - 23 mg/dL 9   10   Creatinine 6.21 - 1.24 mg/dL 3.08   6.57  Sodium 135 - 145 mmol/L 137   137   Potassium 3.5 - 5.1 mmol/L 5.0   3.9   Chloride 98 - 111 mmol/L 98   101   CO2 22 - 32 mmol/L 30   29   Calcium 8.9 - 10.3 mg/dL 9.1   9.1   Total Protein 6.5 - 8.1 g/dL 7.0  6.1    Total  Bilirubin 0.0 - 1.2 mg/dL 0.5  0.3    Alkaline Phos 38 - 126 U/L 140     AST 15 - 41 U/L 37  27    ALT 0 - 44 U/L 33  26      DIAGNOSTIC IMAGING:  I have independently reviewed the relevant imaging and discussed with the patient.   WRAP UP:  All questions were answered. The patient knows to call the clinic with any problems, questions or concerns.  Medical decision making: Moderate  Time spent on visit: I spent 20 minutes counseling the patient face to face. The total time spent in the appointment was 30 minutes and more than 50% was on counseling.  Carnella Guadalajara, PA-C  03/25/23 5:56 PM

## 2023-03-25 ENCOUNTER — Inpatient Hospital Stay (HOSPITAL_BASED_OUTPATIENT_CLINIC_OR_DEPARTMENT_OTHER): Payer: Medicare HMO | Admitting: Physician Assistant

## 2023-03-25 ENCOUNTER — Other Ambulatory Visit: Payer: Self-pay | Admitting: Cardiovascular Disease

## 2023-03-25 ENCOUNTER — Other Ambulatory Visit: Payer: Medicare HMO

## 2023-03-25 ENCOUNTER — Ambulatory Visit: Payer: Medicare HMO | Admitting: Physician Assistant

## 2023-03-25 ENCOUNTER — Inpatient Hospital Stay: Payer: Medicare HMO | Attending: Hematology

## 2023-03-25 VITALS — BP 124/74 | HR 51 | Temp 98.1°F | Resp 18 | Wt 165.2 lb

## 2023-03-25 DIAGNOSIS — I255 Ischemic cardiomyopathy: Secondary | ICD-10-CM | POA: Diagnosis not present

## 2023-03-25 DIAGNOSIS — I10 Essential (primary) hypertension: Secondary | ICD-10-CM | POA: Insufficient documentation

## 2023-03-25 DIAGNOSIS — D72821 Monocytosis (symptomatic): Secondary | ICD-10-CM | POA: Diagnosis present

## 2023-03-25 DIAGNOSIS — Z8249 Family history of ischemic heart disease and other diseases of the circulatory system: Secondary | ICD-10-CM | POA: Diagnosis not present

## 2023-03-25 DIAGNOSIS — M069 Rheumatoid arthritis, unspecified: Secondary | ICD-10-CM | POA: Diagnosis not present

## 2023-03-25 DIAGNOSIS — I252 Old myocardial infarction: Secondary | ICD-10-CM | POA: Diagnosis not present

## 2023-03-25 DIAGNOSIS — R059 Cough, unspecified: Secondary | ICD-10-CM | POA: Insufficient documentation

## 2023-03-25 DIAGNOSIS — I4891 Unspecified atrial fibrillation: Secondary | ICD-10-CM | POA: Insufficient documentation

## 2023-03-25 DIAGNOSIS — Z5986 Financial insecurity: Secondary | ICD-10-CM | POA: Diagnosis not present

## 2023-03-25 DIAGNOSIS — R5383 Other fatigue: Secondary | ICD-10-CM | POA: Insufficient documentation

## 2023-03-25 DIAGNOSIS — E785 Hyperlipidemia, unspecified: Secondary | ICD-10-CM | POA: Diagnosis not present

## 2023-03-25 DIAGNOSIS — D72829 Elevated white blood cell count, unspecified: Secondary | ICD-10-CM

## 2023-03-25 DIAGNOSIS — I2510 Atherosclerotic heart disease of native coronary artery without angina pectoris: Secondary | ICD-10-CM | POA: Insufficient documentation

## 2023-03-25 DIAGNOSIS — R0602 Shortness of breath: Secondary | ICD-10-CM | POA: Insufficient documentation

## 2023-03-25 DIAGNOSIS — Z79899 Other long term (current) drug therapy: Secondary | ICD-10-CM | POA: Insufficient documentation

## 2023-03-25 DIAGNOSIS — Z7901 Long term (current) use of anticoagulants: Secondary | ICD-10-CM | POA: Diagnosis not present

## 2023-03-25 DIAGNOSIS — R42 Dizziness and giddiness: Secondary | ICD-10-CM | POA: Insufficient documentation

## 2023-03-25 DIAGNOSIS — Z79631 Long term (current) use of antimetabolite agent: Secondary | ICD-10-CM | POA: Diagnosis not present

## 2023-03-25 DIAGNOSIS — Z7952 Long term (current) use of systemic steroids: Secondary | ICD-10-CM | POA: Insufficient documentation

## 2023-03-25 DIAGNOSIS — K219 Gastro-esophageal reflux disease without esophagitis: Secondary | ICD-10-CM | POA: Insufficient documentation

## 2023-03-25 DIAGNOSIS — Z79624 Long term (current) use of inhibitors of nucleotide synthesis: Secondary | ICD-10-CM | POA: Diagnosis not present

## 2023-03-25 DIAGNOSIS — Z87891 Personal history of nicotine dependence: Secondary | ICD-10-CM | POA: Insufficient documentation

## 2023-03-25 DIAGNOSIS — L4050 Arthropathic psoriasis, unspecified: Secondary | ICD-10-CM | POA: Insufficient documentation

## 2023-03-25 DIAGNOSIS — R079 Chest pain, unspecified: Secondary | ICD-10-CM | POA: Insufficient documentation

## 2023-03-25 DIAGNOSIS — Z7902 Long term (current) use of antithrombotics/antiplatelets: Secondary | ICD-10-CM | POA: Diagnosis not present

## 2023-03-25 LAB — COMPREHENSIVE METABOLIC PANEL
ALT: 33 U/L (ref 0–44)
AST: 37 U/L (ref 15–41)
Albumin: 2.9 g/dL — ABNORMAL LOW (ref 3.5–5.0)
Alkaline Phosphatase: 140 U/L — ABNORMAL HIGH (ref 38–126)
Anion gap: 9 (ref 5–15)
BUN: 9 mg/dL (ref 8–23)
CO2: 30 mmol/L (ref 22–32)
Calcium: 9.1 mg/dL (ref 8.9–10.3)
Chloride: 98 mmol/L (ref 98–111)
Creatinine, Ser: 0.55 mg/dL — ABNORMAL LOW (ref 0.61–1.24)
GFR, Estimated: >60 mL/min (ref >60)
Glucose, Bld: 100 mg/dL — ABNORMAL HIGH (ref 70–99)
Potassium: 5 mmol/L (ref 3.5–5.1)
Sodium: 137 mmol/L (ref 135–145)
Total Bilirubin: 0.5 mg/dL (ref 0.0–1.2)
Total Protein: 7 g/dL (ref 6.5–8.1)

## 2023-03-25 LAB — CBC WITH DIFFERENTIAL/PLATELET
Abs Immature Granulocytes: 0.06 10*3/uL (ref 0.00–0.07)
Basophils Absolute: 0.1 10*3/uL (ref 0.0–0.1)
Basophils Relative: 1 %
Eosinophils Absolute: 0 10*3/uL (ref 0.0–0.5)
Eosinophils Relative: 0 %
HCT: 41.3 % (ref 39.0–52.0)
Hemoglobin: 13.1 g/dL (ref 13.0–17.0)
Immature Granulocytes: 1 %
Lymphocytes Relative: 19 %
Lymphs Abs: 1.6 10*3/uL (ref 0.7–4.0)
MCH: 28.1 pg (ref 26.0–34.0)
MCHC: 31.7 g/dL (ref 30.0–36.0)
MCV: 88.4 fL (ref 80.0–100.0)
Monocytes Absolute: 0.8 10*3/uL (ref 0.1–1.0)
Monocytes Relative: 9 %
Neutro Abs: 5.8 10*3/uL (ref 1.7–7.7)
Neutrophils Relative %: 70 %
Platelets: 307 10*3/uL (ref 150–400)
RBC: 4.67 MIL/uL (ref 4.22–5.81)
RDW: 16 % — ABNORMAL HIGH (ref 11.5–15.5)
WBC: 8.4 10*3/uL (ref 4.0–10.5)
nRBC: 0 % (ref 0.0–0.2)

## 2023-03-25 LAB — LACTATE DEHYDROGENASE: LDH: 205 U/L — ABNORMAL HIGH (ref 98–192)

## 2023-03-31 ENCOUNTER — Other Ambulatory Visit: Payer: Self-pay | Admitting: Rheumatology

## 2023-03-31 NOTE — Telephone Encounter (Signed)
Last Fill: 01/16/2023  Labs: 03/24/2022 Glucose 100, Creat. 0.55, Albumin 2.9, Alk. Phos 140, RDW 16.0  Next Visit: 07/25/2023  Last Visit: 01/24/2023  DX:  Psoriatic arthritis    Current Dose per office note 01/24/2023: Imuran 50 mg 3 tablets by mouth daily,   Okay to refill Imuran?

## 2023-04-04 ENCOUNTER — Other Ambulatory Visit: Payer: Self-pay | Admitting: *Deleted

## 2023-04-04 MED ORDER — CLOBETASOL PROPIONATE E 0.05 % EX CREA: TOPICAL_CREAM | CUTANEOUS | 2 refills | Status: DC

## 2023-04-04 NOTE — Telephone Encounter (Signed)
Last Fill: 08/14/2022  Next Visit: 07/25/2023  Last Visit: 01/24/2023  Dx: psoriasis   Current Dose per office note on 01/24/2023: not discussed  Okay to refill Clobetasol Cream?

## 2023-04-07 ENCOUNTER — Ambulatory Visit: Payer: Medicare HMO | Admitting: Cardiovascular Disease

## 2023-04-08 ENCOUNTER — Encounter: Payer: Self-pay | Admitting: Cardiovascular Disease

## 2023-04-08 ENCOUNTER — Ambulatory Visit: Payer: Medicare HMO | Attending: Cardiovascular Disease | Admitting: Cardiovascular Disease

## 2023-04-08 VITALS — BP 102/52 | HR 53 | Ht 67.0 in | Wt 164.0 lb

## 2023-04-08 DIAGNOSIS — Z9861 Coronary angioplasty status: Secondary | ICD-10-CM

## 2023-04-08 DIAGNOSIS — I447 Left bundle-branch block, unspecified: Secondary | ICD-10-CM | POA: Diagnosis not present

## 2023-04-08 DIAGNOSIS — E785 Hyperlipidemia, unspecified: Secondary | ICD-10-CM

## 2023-04-08 DIAGNOSIS — I10 Essential (primary) hypertension: Secondary | ICD-10-CM

## 2023-04-08 DIAGNOSIS — I2510 Atherosclerotic heart disease of native coronary artery without angina pectoris: Secondary | ICD-10-CM

## 2023-04-08 DIAGNOSIS — I255 Ischemic cardiomyopathy: Secondary | ICD-10-CM

## 2023-04-08 DIAGNOSIS — I480 Paroxysmal atrial fibrillation: Secondary | ICD-10-CM | POA: Diagnosis not present

## 2023-04-08 NOTE — Assessment & Plan Note (Signed)
History of ischemic cardiomyopathy with an EF is persistently reduced to the 35 to 40% range by his most recent 2D echo performed 01/14/2023.  He is on a beta-blocker and an ARB.

## 2023-04-08 NOTE — Assessment & Plan Note (Signed)
History of essential hypertension her blood pressure measured today at 102/52.  He is on amlodipine, and losartan.

## 2023-04-08 NOTE — Assessment & Plan Note (Signed)
 History of CAD status post inferolateral STEMI 02/22/2015.  I took him to the Cath Lab and stented his proximal circumflex and first obtuse marginal branch with drug-eluting stents (T stenting).  He had residual 90% stenosis in the mid nondominant RCA and 80% proximal LAD stenosis.  He had a non-STEMI 05/09/2018 and underwent repeat catheterization by Dr. Claudene with reintervention of his T stent with Cutting Balloon atherectomy.  He was hospitalized 01/14/2019 for chest pain but left prior to be completely evaluated.  That apparently was his only episode since I saw him a year ago.

## 2023-04-08 NOTE — Progress Notes (Signed)
 04/08/2023 KERNEY HOPFENSPERGER   05/13/1955  [id number]  Primary Physician Sophronia Ozell JAYSON, MD Primary Cardiologist: Dorn JINNY Lesches MD [place] [place], [place]  HPI:  Curtis Flores is a 68 y.o.    moderately overweight, married African-American male father of 2, grandfather to 3 grandchildren accompanied by his wife Nena today. I last saw him in the office 03/15/2022.SABRAHis primary care physician is Dr. Wells in medicine. I recently saw him in the office 03/07/15. He has a remote history of tobacco abuse as well as a history of hypertension and hyperlipidemia rheumatoid arthritis. He had an inferolateral STEMI on 02/22/15. I took him to the Cath Lab and stented his proximal dominant circumflex and first obtuse marginal branch with drug-eluting stents (T stenting). He had residual 90% stenosis and a mid nondominant RCA 80% proximal LAD stenosis. His EF was 35-40% by 2-D echo. Subsequent Myoview  stress testing performed soon thereafter was nonischemic.SABRA There is a history of pulmonary fibrosis however as well. When I saw him in the office on 04/05/15 he was significantly dyspneic and tachycardic. I initially thought that this was sinus tachycardia however he was admitted to the hospital 2 days later with progressive dyspnea and he was diagnosed with atrial flutter. He did have a wide complex tachycardia prior to that which converted with venous adenosine. He UNDERWENT transesophageal echo/DC cardioversion successfully was has resulted in marked improvement in his symptoms. His EF at that time was 20%. He was put on oral anticoagulations and Brilenta was changed to Plavix . He also underwent a flutter ablation by Dr. Kelsie on 05/30/15 and was taken off his amiodarone .    He was hospitalized 05/09/2018 with non-STEMI.  He underwent cardiac catheterization by Dr. Claudene demonstrating in-stent restenosis within the proximal circumflex T stent and underwent Cutting Balloon atherectomy.  He did develop a flutter  prior to his hospitalization but converted to sinus rhythm and was begun on sotalol  by Dr. Kelsie.  He continues to maintain sinus rhythm/sinus bradycardia on Eliquis  oral anticoagulation.  He does have pulmonary fibrosis followed by Dr. Jude.   Since I saw him a year ago he is remained stable.  He sees Dr. Alva for pulmonary fibrosis.  He was hospitalized in early November with chest pain but left prematurely.  This was his only episode of chest pain since I saw him a year ago.  His most recent 2D echo performed 01/14/2023 revealed stable EF in the 35 to 40% range.   Current Meds  Medication Sig   albuterol  (VENTOLIN  HFA) 108 (90 Base) MCG/ACT inhaler Inhale 2 puffs into the lungs every 6 (six) hours as needed for wheezing or shortness of breath.   amLODipine  (NORVASC ) 5 MG tablet Take 1 tablet (5 mg total) by mouth daily. Please keep upcoming Feb appt for further refills. Thank you   apixaban  (ELIQUIS ) 5 MG TABS tablet TAKE 1 TABLET TWICE DAILY   atorvastatin  (LIPITOR ) 80 MG tablet TAKE 1 TABLET EVERY DAY AT 6 PM (APPOINTMENT IS NEEDED)   azaTHIOprine  (IMURAN ) 50 MG tablet TAKE 3 TABLETS EVERY DAY   clobetasol  (TEMOVATE ) 0.05 % external solution Apply 1 Application topically 2 (two) times daily.   Clobetasol  Prop Emollient Base (CLOBETASOL  PROPIONATE E) 0.05 % emollient cream APPLY TO AFFECTED AREA TWICE A DAY AS NEEDED   clopidogrel  (PLAVIX ) 75 MG tablet Take 75 mg by mouth daily.   furosemide  (LASIX ) 40 MG tablet TAKE 1 TABLET TWICE DAILY   losartan  (COZAAR ) 100 MG tablet  TAKE 1 TABLET EVERY DAY   pantoprazole  (PROTONIX ) 40 MG tablet TAKE 1 TABLET TWICE DAILY   potassium chloride  SA (KLOR-CON  M) 20 MEQ tablet TAKE 2 TABLETS TWICE DAILY   predniSONE  (DELTASONE ) 5 MG tablet TAKE 1 TABLET DAILY WITH BREAKFAST.   sotalol  (BETAPACE ) 120 MG tablet TAKE 1 TABLET EVERY 12 HOURS     No Known Allergies  Social History   Socioeconomic History   Marital status: Married    Spouse name: Not on  file   Number of children: 2   Years of education: Not on file   Highest education level: Not on file  Occupational History   Occupation: unemployed    Comment: not working do to arthritis; used to be production designer, theatre/television/film for a programmer, systems  Tobacco Use   Smoking status: Former    Current packs/day: 0.00    Average packs/day: 1 pack/day for 30.0 years (30.0 ttl pk-yrs)    Types: Cigarettes    Start date: 03/20/1973    Quit date: 03/21/2003    Years since quitting: 20.0    Passive exposure: Past   Smokeless tobacco: Never  Vaping Use   Vaping status: Never Used  Substance and Sexual Activity   Alcohol use: No    Alcohol/week: 0.0 standard drinks of alcohol    Comment: H/O case of beer weekly x 20 years, quiting in 2000-ish.   Drug use: No    Comment: H/O marijuana use many years ago.   Sexual activity: Yes    Birth control/protection: None  Other Topics Concern   Not on file  Social History Narrative   Not on file   Social Drivers of Health   Financial Resource Strain: Medium Risk (04/10/2022)   Received from [facility], [facility]   Overall Financial Resource Strain (CARDIA)    Difficulty of Paying Living Expenses: Somewhat hard  Food Insecurity: No Food Insecurity (01/13/2023)   Hunger Vital Sign    Worried About Running Out of Food in the Last Year: Never true    Ran Out of Food in the Last Year: Never true  Transportation Needs: No Transportation Needs (01/13/2023)   PRAPARE - Administrator, Civil Service (Medical): No    Lack of Transportation (Non-Medical): No  Physical Activity: Insufficiently Active (10/17/2020)   Received from [facility], [facility]   Exercise Vital Sign    Days of Exercise per Week: 3 days    Minutes of Exercise per Session: 20 min  Stress: No Stress Concern Present (10/17/2020)   Received from [facility], [facility] - Occupational Stress Questionnaire    Feeling of  Stress : Not at all  Social Connections: Unknown (07/05/2021)   Received from [facility], [facility]   Social Network    Social Network: Not on file  Intimate Partner Violence: Not At Risk (01/13/2023)   Humiliation, Afraid, Rape, and Kick questionnaire    Fear of Current or Ex-Partner: No    Emotionally Abused: No    Physically Abused: No    Sexually Abused: No     Review of Systems: General: negative for chills, fever, night sweats or weight changes.  Cardiovascular: negative for chest pain, dyspnea on exertion, edema, orthopnea, palpitations, paroxysmal nocturnal dyspnea or shortness of breath Dermatological: negative for rash Respiratory: negative for cough or wheezing Urologic: negative for hematuria Abdominal: negative for nausea, vomiting, diarrhea, bright red blood per rectum, melena, or hematemesis Neurologic: negative  for visual changes, syncope, or dizziness All other systems reviewed and are otherwise negative except as noted above.    Blood pressure (!) 102/52, pulse (!) 53, height 5' 7 (1.702 m), weight 164 lb (74.4 kg), SpO2 94%.  General appearance: alert and no distress Neck: no adenopathy, no carotid bruit, no JVD, supple, symmetrical, trachea midline, and thyroid not enlarged, symmetric, no tenderness/mass/nodules Lungs: clear to auscultation bilaterally Heart: regular rate and rhythm, S1, S2 normal, no murmur, click, rub or gallop Extremities: extremities normal, atraumatic, no cyanosis or edema Pulses: 2+ and symmetric Skin: Skin color, texture, turgor normal. No rashes or lesions Neurologic: Grossly normal  EKG not performed today      ASSESSMENT AND PLAN:   Dyslipidemia, goal LDL below 70 History of dyslipidemia on high-dose atorvastatin  with lipid profile performed 07/19/2020 revealing total cholesterol 140, LDL of 76 and HDL 49.  CAD S/P percutaneous coronary angioplasty History of CAD status post inferolateral STEMI 02/22/2015.  I took him to  the Cath Lab and stented his proximal circumflex and first obtuse marginal branch with drug-eluting stents (T stenting).  He had residual 90% stenosis in the mid nondominant RCA and 80% proximal LAD stenosis.  He had a non-STEMI 05/09/2018 and underwent repeat catheterization by Dr. Claudene with reintervention of his T stent with Cutting Balloon atherectomy.  He was hospitalized 01/14/2019 for chest pain but left prior to be completely evaluated.  That apparently was his only episode since I saw him a year ago.  LBBB (left bundle branch block) Chronic  PAF (paroxysmal atrial fibrillation) (HCC) History of PAF maintaining sinus rhythm on sotalol  and Eliquis .  Essential hypertension History of essential hypertension her blood pressure measured today at 102/52.  He is on amlodipine , and losartan .  Ischemic cardiomyopathy History of ischemic cardiomyopathy with an EF is persistently reduced to the 35 to 40% range by his most recent 2D echo performed 01/14/2023.  He is on a beta-blocker and an ARB.     Dorn DOROTHA Lesches MD FACP,FACC,FAHA, [facility] 04/08/2023 12:27 PM

## 2023-04-08 NOTE — Assessment & Plan Note (Signed)
History of dyslipidemia on high-dose atorvastatin with lipid profile performed 07/19/2020 revealing total cholesterol 140, LDL of 76 and HDL 49.

## 2023-04-08 NOTE — Assessment & Plan Note (Signed)
 Chronic.

## 2023-04-08 NOTE — Assessment & Plan Note (Signed)
History of PAF maintaining sinus rhythm on sotalol and Eliquis.

## 2023-04-08 NOTE — Patient Instructions (Signed)
Medication Instructions:  Your physician recommends that you continue on your current medications as directed. Please refer to the Current Medication list given to you today.  *If you need a refill on your cardiac medications before your next appointment, please call your pharmacy*    Follow-Up: At [facility], you and your health needs are our priority.  As part of our continuing mission to provide you with exceptional heart care, we have created designated Provider Care Teams.  These Care Teams include your primary Cardiologist (physician) and Advanced Practice Providers (APPs -  Physician Assistants and Nurse Practitioners) who all work together to provide you with the care you need, when you need it.  We recommend signing up for the patient portal called "MyChart".  Sign up information is provided on this After Visit Summary.  MyChart is used to connect with patients for Virtual Visits (Telemedicine).  Patients are able to view lab/test results, encounter notes, upcoming appointments, etc.  Non-urgent messages can be sent to your provider as well.   To learn more about what you can do with MyChart, go to [email or web address].    Your next appointment:   6 month(s)  Provider:   Micah Flesher, PA-C, Marjie Skiff, PA-C, Azalee Course, PA-C, Bernadene Person, NP, or Reather Littler, NP       Then, Nanetta Batty, MD will plan to see you again in 12 month(s).    Other Instructions

## 2023-04-14 NOTE — Telephone Encounter (Signed)
 Closing encounter. Will await follow-up from patient

## 2023-04-16 ENCOUNTER — Other Ambulatory Visit: Payer: Self-pay | Admitting: Physician Assistant

## 2023-04-16 DIAGNOSIS — M0579 Rheumatoid arthritis with rheumatoid factor of multiple sites without organ or systems involvement: Secondary | ICD-10-CM

## 2023-04-16 DIAGNOSIS — L4050 Arthropathic psoriasis, unspecified: Secondary | ICD-10-CM

## 2023-04-16 DIAGNOSIS — L409 Psoriasis, unspecified: Secondary | ICD-10-CM

## 2023-04-16 DIAGNOSIS — Z79899 Other long term (current) drug therapy: Secondary | ICD-10-CM

## 2023-04-16 NOTE — Telephone Encounter (Signed)
Last Fill: 01/13/2023  Next Visit: 07/25/2023  Last Visit: 01/24/2023  Dx: Psoriatic arthritis   Current Dose per office note on 01/24/2023: prednisone 5 mg daily   Okay to refill Prednisone?

## 2023-04-19 ENCOUNTER — Emergency Department (HOSPITAL_COMMUNITY)
Admission: EM | Admit: 2023-04-19 | Discharge: 2023-04-19 | Disposition: A | Discharge status: Home / Self Care | Payer: Medicare HMO | Attending: Emergency Medicine | Admitting: Emergency Medicine

## 2023-04-19 ENCOUNTER — Emergency Department (HOSPITAL_COMMUNITY): Payer: Medicare HMO

## 2023-04-19 DIAGNOSIS — Z7901 Long term (current) use of anticoagulants: Secondary | ICD-10-CM | POA: Insufficient documentation

## 2023-04-19 DIAGNOSIS — J189 Pneumonia, unspecified organism: Secondary | ICD-10-CM | POA: Diagnosis not present

## 2023-04-19 DIAGNOSIS — J449 Chronic obstructive pulmonary disease, unspecified: Secondary | ICD-10-CM | POA: Diagnosis not present

## 2023-04-19 DIAGNOSIS — I517 Cardiomegaly: Secondary | ICD-10-CM | POA: Diagnosis not present

## 2023-04-19 DIAGNOSIS — I2510 Atherosclerotic heart disease of native coronary artery without angina pectoris: Secondary | ICD-10-CM | POA: Insufficient documentation

## 2023-04-19 DIAGNOSIS — R059 Cough, unspecified: Secondary | ICD-10-CM | POA: Diagnosis present

## 2023-04-19 DIAGNOSIS — J90 Pleural effusion, not elsewhere classified: Secondary | ICD-10-CM | POA: Insufficient documentation

## 2023-04-19 DIAGNOSIS — Z7951 Long term (current) use of inhaled steroids: Secondary | ICD-10-CM | POA: Insufficient documentation

## 2023-04-19 DIAGNOSIS — R0602 Shortness of breath: Secondary | ICD-10-CM | POA: Diagnosis not present

## 2023-04-19 DIAGNOSIS — J181 Lobar pneumonia, unspecified organism: Secondary | ICD-10-CM | POA: Insufficient documentation

## 2023-04-19 DIAGNOSIS — J8410 Pulmonary fibrosis, unspecified: Secondary | ICD-10-CM | POA: Diagnosis not present

## 2023-04-19 DIAGNOSIS — K449 Diaphragmatic hernia without obstruction or gangrene: Secondary | ICD-10-CM | POA: Diagnosis not present

## 2023-04-19 LAB — CBC WITH DIFFERENTIAL/PLATELET
Abs Immature Granulocytes: 0.06 10*3/uL (ref 0.00–0.07)
Basophils Absolute: 0.1 10*3/uL (ref 0.0–0.1)
Basophils Relative: 1 %
Eosinophils Absolute: 0.1 10*3/uL (ref 0.0–0.5)
Eosinophils Relative: 1 %
HCT: 45.1 % (ref 39.0–52.0)
Hemoglobin: 14.4 g/dL (ref 13.0–17.0)
Immature Granulocytes: 1 %
Lymphocytes Relative: 17 %
Lymphs Abs: 1.6 10*3/uL (ref 0.7–4.0)
MCH: 27.9 pg (ref 26.0–34.0)
MCHC: 31.9 g/dL (ref 30.0–36.0)
MCV: 87.4 fL (ref 80.0–100.0)
Monocytes Absolute: 1 10*3/uL (ref 0.1–1.0)
Monocytes Relative: 10 %
Neutro Abs: 6.9 10*3/uL (ref 1.7–7.7)
Neutrophils Relative %: 70 %
Platelets: 311 10*3/uL (ref 150–400)
RBC: 5.16 MIL/uL (ref 4.22–5.81)
RDW: 16.8 % — ABNORMAL HIGH (ref 11.5–15.5)
WBC: 9.6 10*3/uL (ref 4.0–10.5)
nRBC: 0.2 % (ref 0.0–0.2)

## 2023-04-19 LAB — RESP PANEL BY RT-PCR (RSV, FLU A&B, COVID)  RVPGX2
Influenza A by PCR: NEGATIVE
Influenza B by PCR: NEGATIVE
Resp Syncytial Virus by PCR: NEGATIVE
SARS Coronavirus 2 by RT PCR: NEGATIVE

## 2023-04-19 LAB — BRAIN NATRIURETIC PEPTIDE: B Natriuretic Peptide: 386 pg/mL — ABNORMAL HIGH (ref 0.0–100.0)

## 2023-04-19 LAB — COMPREHENSIVE METABOLIC PANEL
ALT: 36 U/L (ref 0–44)
AST: 38 U/L (ref 15–41)
Albumin: 3 g/dL — ABNORMAL LOW (ref 3.5–5.0)
Alkaline Phosphatase: 142 U/L — ABNORMAL HIGH (ref 38–126)
Anion gap: 11 (ref 5–15)
BUN: 10 mg/dL (ref 8–23)
CO2: 30 mmol/L (ref 22–32)
Calcium: 9.7 mg/dL (ref 8.9–10.3)
Chloride: 97 mmol/L — ABNORMAL LOW (ref 98–111)
Creatinine, Ser: 0.56 mg/dL — ABNORMAL LOW (ref 0.61–1.24)
GFR, Estimated: >60 mL/min (ref >60)
Glucose, Bld: 100 mg/dL — ABNORMAL HIGH (ref 70–99)
Potassium: 5.3 mmol/L — ABNORMAL HIGH (ref 3.5–5.1)
Sodium: 138 mmol/L (ref 135–145)
Total Bilirubin: 0.6 mg/dL (ref 0.0–1.2)
Total Protein: 7.7 g/dL (ref 6.5–8.1)

## 2023-04-19 LAB — TROPONIN I (HIGH SENSITIVITY)
Troponin I (High Sensitivity): 9 ng/L (ref <18)
Troponin I (High Sensitivity): 10 ng/L (ref <18)

## 2023-04-19 MED ORDER — LEVOFLOXACIN 750 MG PO TABS: 750.0000 mg | ORAL_TABLET | Freq: Every day | ORAL | 9 refills | Status: DC

## 2023-04-19 MED ORDER — IOHEXOL 300 MG/ML  SOLN
75.0000 mL | Freq: Once | INTRAMUSCULAR | Status: AC | PRN
  Administered 2023-04-19: 75 mL via INTRAVENOUS

## 2023-04-19 NOTE — ED Notes (Signed)
 Patient transported to CT

## 2023-04-19 NOTE — Discharge Instructions (Signed)
 Follow-up with your lung doctor in the next couple weeks for recheck.  Get seen sooner if getting worse

## 2023-04-19 NOTE — ED Provider Notes (Signed)
 [place] EMERGENCY DEPARTMENT AT [facility] Provider Note   CSN: [id number] Arrival date & time: 04/19/23  1015     History {Add pertinent medical, surgical, social history, OB history to HPI:1} Chief Complaint  Patient presents with   URI    Curtis Flores is a 68 y.o. male.  Patient with persistent cough.  Patient has COPD.   URI      Home Medications Prior to Admission medications   Medication Sig Start Date End Date Taking? Authorizing Provider  levofloxacin (LEVAQUIN) 750 MG tablet Take 1 tablet (750 mg total) by mouth daily. X 7 days 04/19/23  Yes Bethann Berkshire, MD  Abatacept (ORENCIA CLICKJECT) 125 MG/ML SOAJ Inject 125 mg into the skin once a week. Patient not taking: Reported on 04/08/2023 12/31/22   Gearldine Bienenstock, PA-C  albuterol (VENTOLIN HFA) 108 (90 Base) MCG/ACT inhaler Inhale 2 puffs into the lungs every 6 (six) hours as needed for wheezing or shortness of breath. 03/12/23   Oretha Milch, MD  amLODipine (NORVASC) 5 MG tablet Take 1 tablet (5 mg total) by mouth daily. Please keep upcoming Feb appt for further refills. Thank you 03/26/23   Runell Gess, MD  apixaban (ELIQUIS) 5 MG TABS tablet TAKE 1 TABLET TWICE DAILY 09/02/22   Runell Gess, MD  atorvastatin (LIPITOR) 80 MG tablet TAKE 1 TABLET EVERY DAY AT 6 PM (APPOINTMENT IS NEEDED) 05/21/22   Runell Gess, MD  azaTHIOprine (IMURAN) 50 MG tablet TAKE 3 TABLETS EVERY DAY 03/31/23   Gearldine Bienenstock, PA-C  clobetasol (TEMOVATE) 0.05 % external solution Apply 1 Application topically 2 (two) times daily. 01/24/23   Gearldine Bienenstock, PA-C  Clobetasol Prop Emollient Base (CLOBETASOL PROPIONATE E) 0.05 % emollient cream APPLY TO AFFECTED AREA TWICE A DAY AS NEEDED 04/04/23   Gearldine Bienenstock, PA-C  clopidogrel (PLAVIX) 75 MG tablet Take 75 mg by mouth daily. 03/27/23   [provider]  furosemide (LASIX) 40 MG tablet TAKE 1 TABLET TWICE DAILY 06/17/22   Runell Gess, MD  losartan (COZAAR)  100 MG tablet TAKE 1 TABLET EVERY DAY 05/21/22   Runell Gess, MD  pantoprazole (PROTONIX) 40 MG tablet TAKE 1 TABLET TWICE DAILY 11/02/20   Gelene Mink, NP  potassium chloride SA (KLOR-CON M) 20 MEQ tablet TAKE 2 TABLETS TWICE DAILY 10/31/22   Runell Gess, MD  predniSONE (DELTASONE) 5 MG tablet TAKE 1 TABLET DAILY WITH BREAKFAST. 04/16/23   Gearldine Bienenstock, PA-C  sotalol (BETAPACE) 120 MG tablet TAKE 1 TABLET EVERY 12 HOURS 10/31/22   Runell Gess, MD      Allergies    Patient has no known allergies.    Review of Systems   Review of Systems  Physical Exam Updated Vital Signs BP 129/61   Pulse 63   Temp 97.6 F (36.4 C) (Oral)   Resp (!) 24   SpO2 94%  Physical Exam  ED Results / Procedures / Treatments   Labs (all labs ordered are listed, but only abnormal results are displayed) Labs Reviewed  CBC WITH DIFFERENTIAL/PLATELET - Abnormal; Notable for the following components:      Result Value   RDW 16.8 (*)    All other components within normal limits  COMPREHENSIVE METABOLIC PANEL - Abnormal; Notable for the following components:   Potassium 5.3 (*)    Chloride 97 (*)    Glucose, Bld 100 (*)    Creatinine, Ser 0.56 (*)  Albumin 3.0 (*)    Alkaline Phosphatase 142 (*)    All other components within normal limits  BRAIN NATRIURETIC PEPTIDE - Abnormal; Notable for the following components:   B Natriuretic Peptide 386.0 (*)    All other components within normal limits  RESP PANEL BY RT-PCR (RSV, FLU A&B, COVID)  [id number]  TROPONIN I (HIGH SENSITIVITY)  TROPONIN I (HIGH SENSITIVITY)    EKG EKG Interpretation Date/Time:  Saturday April 19 2023 10:28:01 EST Ventricular Rate:  62 PR Interval:  160 QRS Duration:  136 QT Interval:  418 QTC Calculation: 424 R Axis:   4  Text Interpretation: Sinus rhythm with Premature atrial complexes Left bundle branch block Abnormal ECG When compared with ECG of 14-Jan-2023 07:39, PREVIOUS ECG IS PRESENT Confirmed by  Bethann Berkshire [phone]) on 04/19/2023 10:54:34 AM  Radiology CT Chest W Contrast Result Date: 04/19/2023 CLINICAL DATA:  Cough, chronic/persisting > 8 weeks, failed empiric treatment EXAM: CT CHEST WITH CONTRAST TECHNIQUE: Multidetector CT imaging of the chest was performed during intravenous contrast administration. RADIATION DOSE REDUCTION: This exam was performed according to the departmental dose-optimization program which includes automated exposure control, adjustment of the mA and/or kV according to patient size and/or use of iterative reconstruction technique. CONTRAST:  75mL OMNIPAQUE IOHEXOL 300 MG/ML  SOLN COMPARISON:  09/20/2021 FINDINGS: Cardiovascular: Mild cardiomegaly. No pericardial effusion. Thoracic aorta is nonaneurysmal. Extensive aortic and coronary artery atherosclerosis. Central pulmonary vasculature is within normal limits. Mediastinum/Nodes: Enlarged mediastinal and bilateral hilar lymph nodes. Reference nodes include 14 mm subcarinal node (series 2, image 71), 12 mm right hilar node (series 2, image 70), and 12 mm left hilar node (series 2, image 82). No axillary lymphadenopathy. Thyroid gland, trachea, and esophagus within normal limits. Moderate hiatal hernia. Lungs/Pleura: Severe pulmonary fibrosis, similar to mildly progressed compared to the prior study. Interval development of airspace consolidation within the left lower lobe. Small bilateral pleural effusions, left greater than right. There is loculated fissural fluid on the left. No pneumothorax. Upper Abdomen: No acute abnormality. Musculoskeletal: There are numerous sclerotic bone lesions throughout the axial and appendicular skeleton including multiple vertebral bodies, bilateral ribs, sternum, bilateral scapula, and bilateral humerus. Findings are compatible with diffuse osseous metastatic disease. No pathologic fractures. Bilateral gynecomastia. IMPRESSION: 1. Background of severe pulmonary fibrosis with interval development  of airspace consolidation within the left lower lobe, concerning for pneumonia. Underlying malignancy is not excluded and short-term follow-up CT following the resolution of patient's symptoms is recommended. Small bilateral pleural effusions, left greater than right. 2. Diffuse sclerotic osseous metastatic disease. Correlate with serum PSA. 3. Enlarged mediastinal and bilateral hilar lymph nodes, which may be reactive or metastatic. 4. Moderate hiatal hernia. 5. Aortic and coronary artery atherosclerosis (ICD10-I70.0). Electronically Signed   By: Duanne Guess D.O.   On: 04/19/2023 14:39   DG Chest Port 1 View Result Date: 04/19/2023 CLINICAL DATA:  sob EXAM: PORTABLE CHEST 1 VIEW COMPARISON:  January 13, 2023 FINDINGS: The cardiomediastinal silhouette is unchanged in contour.Atherosclerotic calcifications. There is a small to moderate LEFT pleural effusion, increased from prior. No pneumothorax. There is a LEFT lower lung consolidative opacity with confluence that is favored increased since prior. Background of coarse reticular opacities consistent with underlying interstitial lung disease. IMPRESSION: 1. There is a small to moderate LEFT pleural effusion, increased from prior. 2. There is a LEFT lower lung consolidative opacity, favored increased since prior. This could reflect atelectasis versus infection versus underlying mass. Recommend either dedicated chest CT with contrast versus follow-up  PA and lateral chest radiograph in 3-4 weeks for further evaluation. Electronically Signed   By: Meda Klinefelter M.D.   On: 04/19/2023 11:05    Procedures Procedures  {Document cardiac monitor, telemetry assessment procedure when appropriate:1}  Medications Ordered in ED Medications  iohexol (OMNIPAQUE) 300 MG/ML solution 75 mL (75 mLs Intravenous Contrast Given 04/19/23 1409)    ED Course/ Medical Decision Making/ A&P   {   Click here for ABCD2, HEART and other calculatorsREFRESH Note before  signing :1}                              Medical Decision Making Amount and/or Complexity of Data Reviewed Labs: ordered. Radiology: ordered.  Risk Prescription drug management.   Patient with COPD and pneumonia.  He started on Levaquin and will follow-up with his pulmonologist  {Document critical care time when appropriate:1} {Document review of labs and clinical decision tools ie heart score, Chads2Vasc2 etc:1}  {Document your independent review of radiology images, and any outside records:1} {Document your discussion with family members, caretakers, and with consultants:1} {Document social determinants of health affecting pt's care:1} {Document your decision making why or why not admission, treatments were needed:1} Final Clinical Impression(s) / ED Diagnoses Final diagnoses:  Community acquired pneumonia of left lower lobe of lung    Rx / DC Orders ED Discharge Orders          Ordered    levofloxacin (LEVAQUIN) 750 MG tablet  Daily        04/19/23 1457

## 2023-04-19 NOTE — ED Triage Notes (Signed)
 Pt reports 2-3 weeks of cough, congestion, SOB, chills. Reports he has hx of PNA and symptoms feel similar.

## 2023-04-22 ENCOUNTER — Other Ambulatory Visit: Payer: Self-pay | Admitting: Cardiovascular Disease

## 2023-05-14 ENCOUNTER — Other Ambulatory Visit: Payer: Self-pay | Admitting: Cardiovascular Disease

## 2023-05-14 DIAGNOSIS — I480 Paroxysmal atrial fibrillation: Secondary | ICD-10-CM

## 2023-05-15 NOTE — Telephone Encounter (Signed)
 Prescription refill request for Eliquis received. Indication: afib  Last office visit: Allyson Sabal 04/08/2023 Scr: 0.56, 04/19/2023 Age: 68 yo  Weight: 74.4 kg   Refill sent.

## 2023-05-22 ENCOUNTER — Telehealth (HOSPITAL_BASED_OUTPATIENT_CLINIC_OR_DEPARTMENT_OTHER): Payer: Self-pay | Admitting: Pulmonary Disease

## 2023-05-22 NOTE — Telephone Encounter (Signed)
**Note De-identified  [place] Obfuscation** Please advise 

## 2023-05-22 NOTE — Telephone Encounter (Signed)
 Called to reschedule patients appointment with Dr. Vassie Loll due to him being out of the office. Patient is having a STAT CT 3/24 and PFT on 4/2. Next available is 5/15 with RA. Patient declined seeing APP. Can we possibly work him in sooner? Or wait to see what CT and PFT results are?  Advised patient I would ask. He has also been placed on the short call list.  Please advise.   Thanks!

## 2023-05-23 NOTE — Telephone Encounter (Signed)
 Pt is aware of recommendations and voiced his understanding.  Nothing further needed.

## 2023-05-26 ENCOUNTER — Ambulatory Visit (HOSPITAL_COMMUNITY)
Admission: RE | Admit: 2023-05-26 | Discharge: 2023-05-26 | Disposition: A | Discharge status: Home / Self Care | Payer: Medicare HMO | Source: Ambulatory Visit | Attending: Pulmonary Disease | Admitting: Pulmonary Disease

## 2023-05-26 DIAGNOSIS — J849 Interstitial pulmonary disease, unspecified: Secondary | ICD-10-CM | POA: Insufficient documentation

## 2023-06-04 ENCOUNTER — Ambulatory Visit (HOSPITAL_BASED_OUTPATIENT_CLINIC_OR_DEPARTMENT_OTHER): Payer: Medicare HMO | Admitting: Pulmonary Disease

## 2023-06-04 DIAGNOSIS — J849 Interstitial pulmonary disease, unspecified: Secondary | ICD-10-CM | POA: Diagnosis not present

## 2023-06-04 LAB — PULMONARY FUNCTION TEST
DL/VA % pred: 96 %
DL/VA: 3.98 ml/min/mmHg/L
DLCO cor % pred: 41 %
DLCO cor: 9.52 ml/min/mmHg
DLCO unc % pred: 40 %
DLCO unc: 9.47 ml/min/mmHg
FEF 25-75 Post: 0.75 L/s
FEF 25-75 Pre: 0.68 L/s
FEF2575-%Change-Post: 10 %
FEF2575-%Pred-Post: 34 %
FEF2575-%Pred-Pre: 31 %
FEV1-%Change-Post: 4 %
FEV1-%Pred-Post: 37 %
FEV1-%Pred-Pre: 36 %
FEV1-Post: 1.07 L
FEV1-Pre: 1.03 L
FEV1FVC-%Change-Post: 4 %
FEV1FVC-%Pred-Pre: 96 %
FEV6-%Change-Post: 0 %
FEV6-%Pred-Post: 39 %
FEV6-%Pred-Pre: 39 %
FEV6-Post: 1.44 L
FEV6-Pre: 1.45 L
FEV6FVC-%Pred-Post: 106 %
FEV6FVC-%Pred-Pre: 106 %
FVC-%Change-Post: 0 %
FVC-%Pred-Post: 37 %
FVC-%Pred-Pre: 37 %
FVC-Post: 1.44 L
FVC-Pre: 1.45 L
Post FEV1/FVC ratio: 74 %
Post FEV6/FVC ratio: 100 %
Pre FEV1/FVC ratio: 71 %
Pre FEV6/FVC Ratio: 100 %
RV % pred: 54 %
RV: 1.23 L
TLC % pred: 42 %
TLC: 2.67 L

## 2023-06-04 NOTE — Patient Instructions (Signed)
 Full PFT Performed Today

## 2023-06-04 NOTE — Progress Notes (Signed)
 Full PFT Performed Today

## 2023-06-05 ENCOUNTER — Inpatient Hospital Stay (HOSPITAL_BASED_OUTPATIENT_CLINIC_OR_DEPARTMENT_OTHER): Payer: Medicare HMO | Admitting: Pulmonary Disease

## 2023-06-06 ENCOUNTER — Inpatient Hospital Stay (HOSPITAL_COMMUNITY)
Admission: EM | Admit: 2023-06-06 | Discharge: 2023-06-13 | DRG: 180 | Disposition: A | Discharge status: Home / Self Care | Attending: Internal Medicine | Admitting: Internal Medicine

## 2023-06-06 ENCOUNTER — Encounter (HOSPITAL_COMMUNITY): Payer: Self-pay | Admitting: *Deleted

## 2023-06-06 ENCOUNTER — Emergency Department (HOSPITAL_COMMUNITY)

## 2023-06-06 ENCOUNTER — Other Ambulatory Visit: Payer: Self-pay

## 2023-06-06 DIAGNOSIS — I2510 Atherosclerotic heart disease of native coronary artery without angina pectoris: Secondary | ICD-10-CM | POA: Diagnosis present

## 2023-06-06 DIAGNOSIS — C801 Malignant (primary) neoplasm, unspecified: Secondary | ICD-10-CM | POA: Insufficient documentation

## 2023-06-06 DIAGNOSIS — Z8249 Family history of ischemic heart disease and other diseases of the circulatory system: Secondary | ICD-10-CM | POA: Diagnosis not present

## 2023-06-06 DIAGNOSIS — K219 Gastro-esophageal reflux disease without esophagitis: Secondary | ICD-10-CM | POA: Diagnosis present

## 2023-06-06 DIAGNOSIS — M0579 Rheumatoid arthritis with rheumatoid factor of multiple sites without organ or systems involvement: Secondary | ICD-10-CM

## 2023-06-06 DIAGNOSIS — E8809 Other disorders of plasma-protein metabolism, not elsewhere classified: Secondary | ICD-10-CM | POA: Diagnosis present

## 2023-06-06 DIAGNOSIS — R7989 Other specified abnormal findings of blood chemistry: Secondary | ICD-10-CM | POA: Diagnosis not present

## 2023-06-06 DIAGNOSIS — M069 Rheumatoid arthritis, unspecified: Secondary | ICD-10-CM | POA: Diagnosis present

## 2023-06-06 DIAGNOSIS — L4050 Arthropathic psoriasis, unspecified: Secondary | ICD-10-CM

## 2023-06-06 DIAGNOSIS — J910 Malignant pleural effusion: Secondary | ICD-10-CM | POA: Diagnosis present

## 2023-06-06 DIAGNOSIS — J90 Pleural effusion, not elsewhere classified: Secondary | ICD-10-CM | POA: Diagnosis not present

## 2023-06-06 DIAGNOSIS — I5022 Chronic systolic (congestive) heart failure: Secondary | ICD-10-CM | POA: Diagnosis present

## 2023-06-06 DIAGNOSIS — R0902 Hypoxemia: Principal | ICD-10-CM

## 2023-06-06 DIAGNOSIS — Z7901 Long term (current) use of anticoagulants: Secondary | ICD-10-CM | POA: Diagnosis not present

## 2023-06-06 DIAGNOSIS — E871 Hypo-osmolality and hyponatremia: Secondary | ICD-10-CM | POA: Diagnosis present

## 2023-06-06 DIAGNOSIS — J8410 Pulmonary fibrosis, unspecified: Secondary | ICD-10-CM | POA: Diagnosis present

## 2023-06-06 DIAGNOSIS — J849 Interstitial pulmonary disease, unspecified: Secondary | ICD-10-CM

## 2023-06-06 DIAGNOSIS — J189 Pneumonia, unspecified organism: Secondary | ICD-10-CM | POA: Diagnosis present

## 2023-06-06 DIAGNOSIS — R7401 Elevation of levels of liver transaminase levels: Secondary | ICD-10-CM | POA: Diagnosis not present

## 2023-06-06 DIAGNOSIS — I252 Old myocardial infarction: Secondary | ICD-10-CM | POA: Diagnosis not present

## 2023-06-06 DIAGNOSIS — Z87891 Personal history of nicotine dependence: Secondary | ICD-10-CM | POA: Diagnosis not present

## 2023-06-06 DIAGNOSIS — M898X9 Other specified disorders of bone, unspecified site: Principal | ICD-10-CM

## 2023-06-06 DIAGNOSIS — Y95 Nosocomial condition: Secondary | ICD-10-CM | POA: Diagnosis present

## 2023-06-06 DIAGNOSIS — C3490 Malignant neoplasm of unspecified part of unspecified bronchus or lung: Principal | ICD-10-CM | POA: Diagnosis present

## 2023-06-06 DIAGNOSIS — Z1152 Encounter for screening for COVID-19: Secondary | ICD-10-CM

## 2023-06-06 DIAGNOSIS — L409 Psoriasis, unspecified: Secondary | ICD-10-CM

## 2023-06-06 DIAGNOSIS — Z9861 Coronary angioplasty status: Secondary | ICD-10-CM

## 2023-06-06 DIAGNOSIS — J9819 Other pulmonary collapse: Secondary | ICD-10-CM | POA: Diagnosis present

## 2023-06-06 DIAGNOSIS — C7951 Secondary malignant neoplasm of bone: Secondary | ICD-10-CM | POA: Diagnosis present

## 2023-06-06 DIAGNOSIS — I10 Essential (primary) hypertension: Secondary | ICD-10-CM | POA: Diagnosis present

## 2023-06-06 DIAGNOSIS — I483 Typical atrial flutter: Secondary | ICD-10-CM | POA: Diagnosis present

## 2023-06-06 DIAGNOSIS — Z79899 Other long term (current) drug therapy: Secondary | ICD-10-CM

## 2023-06-06 DIAGNOSIS — I4892 Unspecified atrial flutter: Secondary | ICD-10-CM | POA: Diagnosis not present

## 2023-06-06 DIAGNOSIS — I110 Hypertensive heart disease with heart failure: Secondary | ICD-10-CM | POA: Diagnosis present

## 2023-06-06 DIAGNOSIS — E782 Mixed hyperlipidemia: Secondary | ICD-10-CM | POA: Diagnosis present

## 2023-06-06 DIAGNOSIS — J9601 Acute respiratory failure with hypoxia: Secondary | ICD-10-CM | POA: Diagnosis present

## 2023-06-06 DIAGNOSIS — R0602 Shortness of breath: Secondary | ICD-10-CM | POA: Diagnosis present

## 2023-06-06 DIAGNOSIS — C61 Malignant neoplasm of prostate: Secondary | ICD-10-CM | POA: Diagnosis present

## 2023-06-06 LAB — MRSA NEXT GEN BY PCR, NASAL: MRSA by PCR Next Gen: NOT DETECTED

## 2023-06-06 LAB — CBC WITH DIFFERENTIAL/PLATELET
Abs Immature Granulocytes: 0.07 10*3/uL (ref 0.00–0.07)
Basophils Absolute: 0 10*3/uL (ref 0.0–0.1)
Basophils Relative: 0 %
Eosinophils Absolute: 0 10*3/uL (ref 0.0–0.5)
Eosinophils Relative: 0 %
HCT: 39.4 % (ref 39.0–52.0)
Hemoglobin: 12.6 g/dL — ABNORMAL LOW (ref 13.0–17.0)
Immature Granulocytes: 1 %
Lymphocytes Relative: 13 %
Lymphs Abs: 1.4 10*3/uL (ref 0.7–4.0)
MCH: 27.7 pg (ref 26.0–34.0)
MCHC: 32 g/dL (ref 30.0–36.0)
MCV: 86.6 fL (ref 80.0–100.0)
Monocytes Absolute: 1.6 10*3/uL — ABNORMAL HIGH (ref 0.1–1.0)
Monocytes Relative: 15 %
Neutro Abs: 7.6 10*3/uL (ref 1.7–7.7)
Neutrophils Relative %: 71 %
Platelets: 272 10*3/uL (ref 150–400)
RBC: 4.55 MIL/uL (ref 4.22–5.81)
RDW: 17.6 % — ABNORMAL HIGH (ref 11.5–15.5)
WBC: 10.7 10*3/uL — ABNORMAL HIGH (ref 4.0–10.5)
nRBC: 0.2 % (ref 0.0–0.2)

## 2023-06-06 LAB — RESP PANEL BY RT-PCR (RSV, FLU A&B, COVID)  RVPGX2
Influenza A by PCR: NEGATIVE
Influenza B by PCR: NEGATIVE
Resp Syncytial Virus by PCR: NEGATIVE
SARS Coronavirus 2 by RT PCR: NEGATIVE

## 2023-06-06 LAB — COMPREHENSIVE METABOLIC PANEL WITH GFR
ALT: 70 U/L — ABNORMAL HIGH (ref 0–44)
AST: 66 U/L — ABNORMAL HIGH (ref 15–41)
Albumin: 2.8 g/dL — ABNORMAL LOW (ref 3.5–5.0)
Alkaline Phosphatase: 152 U/L — ABNORMAL HIGH (ref 38–126)
Anion gap: 11 (ref 5–15)
BUN: 12 mg/dL (ref 8–23)
CO2: 29 mmol/L (ref 22–32)
Calcium: 9.2 mg/dL (ref 8.9–10.3)
Chloride: 88 mmol/L — ABNORMAL LOW (ref 98–111)
Creatinine, Ser: 0.54 mg/dL — ABNORMAL LOW (ref 0.61–1.24)
GFR, Estimated: >60 mL/min (ref >60)
Glucose, Bld: 88 mg/dL (ref 70–99)
Potassium: 5.1 mmol/L (ref 3.5–5.1)
Sodium: 128 mmol/L — ABNORMAL LOW (ref 135–145)
Total Bilirubin: 0.9 mg/dL (ref 0.0–1.2)
Total Protein: 7.1 g/dL (ref 6.5–8.1)

## 2023-06-06 LAB — BRAIN NATRIURETIC PEPTIDE: B Natriuretic Peptide: 336 pg/mL — ABNORMAL HIGH (ref 0.0–100.0)

## 2023-06-06 MED ORDER — IOHEXOL 300 MG/ML  SOLN
75.0000 mL | Freq: Once | INTRAMUSCULAR | Status: AC | PRN
  Administered 2023-06-06: 75 mL via INTRAVENOUS

## 2023-06-06 MED ORDER — VANCOMYCIN HCL 1500 MG/300ML IV SOLN
1500.0000 mg | Freq: Once | INTRAVENOUS | Status: AC
  Administered 2023-06-06: 1500 mg via INTRAVENOUS
  Filled 2023-06-06: qty 300

## 2023-06-06 MED ORDER — SODIUM CHLORIDE 0.9 % IV SOLN
2.0000 g | Freq: Once | INTRAVENOUS | Status: AC
  Administered 2023-06-06: 2 g via INTRAVENOUS
  Filled 2023-06-06: qty 12.5

## 2023-06-06 MED ORDER — METHYLPREDNISOLONE SODIUM SUCC 40 MG IJ SOLR
40.0000 mg | Freq: Three times a day (TID) | INTRAMUSCULAR | Status: DC
  Administered 2023-06-06: 40 mg via INTRAVENOUS
  Filled 2023-06-06: qty 1

## 2023-06-06 MED ORDER — VANCOMYCIN HCL 750 MG/150ML IV SOLN
750.0000 mg | Freq: Two times a day (BID) | INTRAVENOUS | Status: DC
  Filled 2023-06-06 (×3): qty 150

## 2023-06-06 NOTE — ED Triage Notes (Addendum)
 Pt with SOB for awhile, has tried inhaler and has helped some.  Pt with hx of COPD

## 2023-06-06 NOTE — ED Notes (Signed)
 Pt placed on 1 L [place] for sats around 90%

## 2023-06-06 NOTE — H&P (Signed)
 History and Physical    Patient: Curtis Flores:[id number] DOB: April 23, 1955 DOA: 06/06/2023 DOS: the patient was seen and examined on 06/07/2023 PCP: Ladora Daniel, PA-C  Patient coming from: Home  Chief Complaint:  Chief Complaint  Patient presents with   Shortness of Breath   HPI: Curtis Flores is a 68 y.o. male with medical history significant of hypertension, hyperlipidemia, pulmonary fibrosis, rheumatoid arthritis, atrial flutter, chronic HFrEF who presents to the emergency department due to shortness of breath.  Patient states that he was diagnosed with left lower lobe pneumonia when he presented to the ED on 04/19/2023, he was discharged with Levaquin at that time.  Patient states that he has been having persistent symptoms of shortness of breath, subjective fever, chills, productive cough since then.  Symptoms worsened today, so he decided to go to the ED for further evaluation and management.  Patient denies chest pain or leg swelling.  ED Course:  In the emergency department, patient was tachypneic with respiratory rate of 24/min, pulse 58 bpm, BP 122/48, temperature 99.2 F, O2 sat 90% on room air.  Workup in the ED showed normal CBC except for WBC of 10.7 and hemoglobin of 12.6.  BMP was normal except for sodium of 128, chloride 88, creatinine 0.54, albumin 2.8, AST 66, ALT 70, ALP 152, BNP 336 (this was 386 on 04/19/2023).  Influenza A, B, SARS, rest 2, RSV was negative.  Blood culture was pending. CT chest with contrast overall  stable appearance of the pleural effusions bilaterally left greater than right. Persistent fibrotic change and left lower lobe consolidation is seen and stable.  Chest x-ray showed redemonstration of left retrocardiac airspace opacity, likely combination of left lung atelectasis and/or consolidation with pleural effusion. Pulmonologist (Dr. Vassie Loll) was consulted and recommended broad-spectrum antibiotic, Solu-Medrol and to admit the patient.  They also suggested  that patient will need drainage of his pleural effusion. He was treated with IV vancomycin and cefepime, IV Solu-Medrol was started. Hospitalist was asked to admit patient for further evaluation and management.  Review of Systems: Review of systems as noted in the HPI. All other systems reviewed and are negative.   Past Medical History:  Diagnosis Date   Abnormal CT scan, stomach    Chronic systolic dysfunction of left ventricle    Coronary artery disease    lateral STEMI 02/22/15 3/10 cutting balloon to ISR of the o/pLCX   GERD (gastroesophageal reflux disease)    Hyperlipidemia    Hypertension    Ischemic cardiomyopathy    LBBB (left bundle branch block)    Leukocytosis    followed by hematology, reactive   Lymphadenopathy    Myocardial infarction (HCC) 02/2015   Psoriasis 2003   Psoriatic arthritis (HCC)    Pulmonary fibrosis (HCC)    Rheumatoid arthritis(714.0) 2012   Typical atrial flutter (HCC)    Past Surgical History:  Procedure Laterality Date   CARDIAC CATHETERIZATION N/A 02/22/2015   Procedure: Left Heart Cath and Coronary Angiography;  Surgeon: Runell Gess, MD;  Location: [facility] INVASIVE CV LAB;  Service: Cardiovascular;  Laterality: N/A;   CARDIAC CATHETERIZATION N/A 02/22/2015   Procedure: Coronary Stent Intervention;  Surgeon: Runell Gess, MD;  Location: [facility] INVASIVE CV LAB;  Service: Cardiovascular;  Laterality: N/A;   CARDIOVERSION N/A 04/12/2015   Procedure: CARDIOVERSION;  Surgeon: Laurey Morale, MD;  Location: [facility] ENDOSCOPY;  Service: Cardiovascular;  Laterality: N/A;   COLONOSCOPY  07/01/2003   WJX:BJYNWG colonic mucosa except for the proximal  right colon in the area of ICV which was not seen completely due to inadequate bowel prep. followed with ACBE which was normal.    COLONOSCOPY N/A 08/24/2015   Dr. Jena Gauss: Normal colon. Next colonoscopy in 10 years.   CORONARY BALLOON ANGIOPLASTY N/A 05/12/2018   Procedure: CORONARY BALLOON ANGIOPLASTY;  Surgeon:  Lyn Records, MD;  Location: [facility] INVASIVE CV LAB;  Service: Cardiovascular;  Laterality: N/A;   ELECTROPHYSIOLOGIC STUDY N/A 05/30/2015   Atrial fibrillation ablation by Dr Johney Frame   ESOPHAGOGASTRODUODENOSCOPY N/A 08/24/2015   Dr. Jena Gauss: Medium-sized hiatal hernia, erosive gastropathy. Cameron lesions. Esophageal mucosa distally suggestive of short segment Barrett's esophagus. Not confirmed on biopsy. Gastric biopsy with minimal chronic inflammation   GIVENS CAPSULE STUDY N/A 04/17/2016   Procedure: GIVENS CAPSULE STUDY;  Surgeon: Corbin Ade, MD;  Location: [facility] ENDO SUITE;  Service: Endoscopy;  Laterality: N/A;  Pt to arrive at 8:00 am for 8:30 am appt   LEFT HEART CATH AND CORONARY ANGIOGRAPHY N/A 05/11/2018   Procedure: LEFT HEART CATH AND CORONARY ANGIOGRAPHY;  Surgeon: Lyn Records, MD;  Location: [facility] INVASIVE CV LAB;  Service: Cardiovascular;  Laterality: N/A;   TEE WITHOUT CARDIOVERSION N/A 04/12/2015   Procedure: TRANSESOPHAGEAL ECHOCARDIOGRAM (TEE);  Surgeon: Laurey Morale, MD;  Location: [facility] ENDOSCOPY;  Service: Cardiovascular;  Laterality: N/A;    Social History:  reports that he quit smoking about 20 years ago. His smoking use included cigarettes. He started smoking about 50 years ago. He has a 30 pack-year smoking history. He has been exposed to tobacco smoke. He has never used smokeless tobacco. He reports that he does not drink alcohol and does not use drugs.   No Known Allergies  Family History  Problem Relation Age of Onset   Hypertension Mother    Colon cancer Neg Hx      Prior to Admission medications   Medication Sig Start Date End Date Taking? Authorizing Provider  Abatacept (ORENCIA CLICKJECT) 125 MG/ML SOAJ Inject 125 mg into the skin once a week. Patient not taking: Reported on 04/08/2023 12/31/22   Gearldine Bienenstock, PA-C  albuterol (VENTOLIN HFA) 108 (90 Base) MCG/ACT inhaler Inhale 2 puffs into the lungs every 6 (six) hours as needed for wheezing or shortness of breath.  03/12/23   Oretha Milch, MD  amLODipine (NORVASC) 5 MG tablet Take 1 tablet (5 mg total) by mouth daily. 04/24/23   Runell Gess, MD  apixaban (ELIQUIS) 5 MG TABS tablet TAKE 1 TABLET TWICE DAILY 05/15/23   Runell Gess, MD  atorvastatin (LIPITOR) 80 MG tablet TAKE 1 TABLET EVERY DAY AT 6 PM (APPOINTMENT IS NEEDED) 05/21/22   Runell Gess, MD  azaTHIOprine (IMURAN) 50 MG tablet TAKE 3 TABLETS EVERY DAY 03/31/23   Gearldine Bienenstock, PA-C  clobetasol (TEMOVATE) 0.05 % external solution Apply 1 Application topically 2 (two) times daily. 01/24/23   Gearldine Bienenstock, PA-C  Clobetasol Prop Emollient Base (CLOBETASOL PROPIONATE E) 0.05 % emollient cream APPLY TO AFFECTED AREA TWICE A DAY AS NEEDED 04/04/23   Gearldine Bienenstock, PA-C  clopidogrel (PLAVIX) 75 MG tablet Take 1 tablet (75 mg total) by mouth daily. 04/24/23   Runell Gess, MD  furosemide (LASIX) 40 MG tablet TAKE 1 TABLET TWICE DAILY 06/17/22   Runell Gess, MD  levofloxacin (LEVAQUIN) 750 MG tablet Take 1 tablet (750 mg total) by mouth daily. X 7 days 04/19/23   Bethann Berkshire, MD  losartan (COZAAR) 100 MG tablet  TAKE 1 TABLET EVERY DAY 05/21/22   Runell Gess, MD  pantoprazole (PROTONIX) 40 MG tablet TAKE 1 TABLET TWICE DAILY 11/02/20   Gelene Mink, NP  potassium chloride SA (KLOR-CON M) 20 MEQ tablet TAKE 2 TABLETS TWICE DAILY 10/31/22   Runell Gess, MD  predniSONE (DELTASONE) 5 MG tablet TAKE 1 TABLET DAILY WITH BREAKFAST. 04/16/23   Gearldine Bienenstock, PA-C  sotalol (BETAPACE) 120 MG tablet TAKE 1 TABLET EVERY 12 HOURS 10/31/22   Runell Gess, MD    Physical Exam: BP (!) 138/55 (BP Location: Right Arm)   Pulse 64   Temp 97.7 F (36.5 C) (Oral)   Resp 20   SpO2 96%   General: 68 y.o. year-old male well developed well nourished in no acute distress.  Alert and oriented x3. HEENT: NCAT, EOMI Neck: Supple, trachea medial Cardiovascular: Regular rate and rhythm with no rubs or gallops.  No thyromegaly or JVD noted.   No lower extremity edema. 2/4 pulses in all 4 extremities. Respiratory: Tachypnea.  Diffuse faint expiratory wheezes, bilateral Rales in lower lobes (L > R ).  Patient on supplemental oxygen via [place] at 2 LPM Abdomen: Soft, nontender nondistended with normal bowel sounds x4 quadrants. Muskuloskeletal: No cyanosis, clubbing or edema noted bilaterally Neuro: CN II-XII intact, strength 5/5 x 4, sensation, reflexes intact Skin: No ulcerative lesions noted or rashes Psychiatry: Judgement and insight appear normal. Mood is appropriate for condition and setting          Labs on Admission:  Basic Metabolic Panel: Recent Labs  Lab 06/06/23 1540  NA 128*  K 5.1  CL 88*  CO2 29  GLUCOSE 88  BUN 12  CREATININE 0.54*  CALCIUM 9.2   Liver Function Tests: Recent Labs  Lab 06/06/23 1540  AST 66*  ALT 70*  ALKPHOS 152*  BILITOT 0.9  PROT 7.1  ALBUMIN 2.8*   No results for input(s): "LIPASE", "AMYLASE" in the last 168 hours. No results for input(s): "AMMONIA" in the last 168 hours. CBC: Recent Labs  Lab 06/06/23 1540  WBC 10.7*  NEUTROABS 7.6  HGB 12.6*  HCT 39.4  MCV 86.6  PLT 272   Cardiac Enzymes: No results for input(s): "CKTOTAL", "CKMB", "CKMBINDEX", "TROPONINI" in the last 168 hours.  BNP (last 3 results) Recent Labs    04/19/23 1130 06/06/23 1540  BNP 386.0* 336.0*    ProBNP (last 3 results) No results for input(s): "PROBNP" in the last 8760 hours.  CBG: No results for input(s): "GLUCAP" in the last 168 hours.  Radiological Exams on Admission: CT Chest W Contrast Result Date: 06/06/2023 CLINICAL DATA:  Follow-up left-sided pleural effusion EXAM: CT CHEST WITH CONTRAST TECHNIQUE: Multidetector CT imaging of the chest was performed during intravenous contrast administration. RADIATION DOSE REDUCTION: This exam was performed according to the departmental dose-optimization program which includes automated exposure control, adjustment of the mA and/or kV according to  patient size and/or use of iterative reconstruction technique. CONTRAST:  75mL OMNIPAQUE IOHEXOL 300 MG/ML  SOLN COMPARISON:  05/26/2023, 04/19/2023 FINDINGS: Cardiovascular: Atherosclerotic calcifications of the thoracic aorta are noted without aneurysmal dilatation or dissection. Heavy coronary calcifications are seen. The heart is at the upper limits of normal in size. No sizable central pulmonary emboli are noted. Mediastinum/Nodes: Thoracic inlet is within normal limits. Scattered likely reactive lymph nodes are noted in the mediastinum similar to that seen on the prior exam. The largest of these is in the subcarinal region measuring 12 mm slightly decreased  when compared with the prior exam. The hilar nodes are also slightly decreased in size when compared with the prior study. The esophagus is air-filled and within normal limits. A small sliding-type hiatal hernia is noted. Lungs/Pleura: Bilateral pleural effusions are again identified left worse than right with fluid trapped in the major fissure on the left. The right-sided effusion is stable when compared with 05/26/2023 but increased when compared with the prior exam from February of 2025. Persistent diffuse fibrotic changes are identified in both lungs with some area of adjacent consolidation particularly in the left lower lobe also stable from the prior study. No new parenchymal nodules or areas of consolidation are noted. Upper Abdomen: Visualized upper abdomen shows no acute abnormality. Some scarring in the right kidney is noted. Musculoskeletal: Multiple sclerotic lesions are again identified throughout the bony structures to include the ribcage, thoracic spine and proximal humeri bilaterally. Scapular and sternal lesions are noted as well. These are again highly suggestive of metastatic prostate disease. Correlation with PSA is recommended. IMPRESSION: Overall stable appearance of the pleural effusions bilaterally left greater than right. Persistent  fibrotic change and left lower lobe consolidation is seen and stable. No new areas of consolidation are noted. Hiatal hernia. Extensive sclerotic osseous lesions highly suggestive of metastatic prostate carcinoma. Correlate with serum PSA. These are relatively stable when compared with the prior exam. Hilar and mediastinal lymph nodes likely reactive in nature and slightly decreased when compared with the prior exam. Aortic Atherosclerosis (ICD10-I70.0). Electronically Signed   By: Alcide Clever M.D.   On: 06/06/2023 19:32   DG Chest 2 View Result Date: 06/06/2023 CLINICAL DATA:  Shortness of breath. EXAM: CHEST - 2 VIEW COMPARISON:  04/19/2023. FINDINGS: Re-demonstration of left retrocardiac airspace opacity obscuring the left hemidiaphragm, descending thoracic aorta and blunting the left lateral costophrenic angle, suggesting combination of left lung atelectasis and/or consolidation with pleural effusion. No significant interval change. There also diffuse reticulonodular opacities throughout bilateral lungs, similar to prior study and corresponds to underlying chronic interstitial lung disease. No acute consolidation or lung collapse. There is trace right pleural effusion as well, unchanged. Stable cardio-mediastinal silhouette. No acute osseous abnormalities. The soft tissues are within normal limits. IMPRESSION: *No significant interval change since the prior study. Redemonstration of left retrocardiac airspace opacity, likely combination of left lung atelectasis and/or consolidation with pleural effusion. Electronically Signed   By: Jules Schick M.D.   On: 06/06/2023 15:22    EKG: I independently viewed the EKG done and my findings are as followed: Normal sinus rhythm at a rate of 64 bpm with LBBB  Assessment/Plan Present on Admission:  HCAP (healthcare-associated pneumonia)  Pulmonary fibrosis (HCC)  Essential hypertension  Mixed hyperlipidemia  GERD (gastroesophageal reflux disease)  Principal  Problem:   HCAP (healthcare-associated pneumonia) Active Problems:   Pulmonary fibrosis (HCC)   Mixed hyperlipidemia   GERD (gastroesophageal reflux disease)   Essential hypertension   Bilateral pleural effusion   Hyponatremia   Transaminitis   Elevated brain natriuretic peptide (BNP) level   Prostate cancer metastatic to bone (HCC)  HCAP in the setting of pulmonary fibrosis Patient was started on vancomycin and cefepime, we shall continue same at this time with plan to de-escalate/discontinue based on blood culture, sputum culture, urine Legionella, strep pneumo and procalcitonin Continue Tylenol as needed Continue Solu-Medrol per pulmonologist recommendation Continue Mucinex, incentive spirometry, flutter valve   Bilateral pleural effusion (L > R) Patient will need thoracentesis, but no IR here at [facility] on weekends Patient will  be admitted to [facility] for thoracentesis. Eliquis was held and patient will need washout period.  Acute respiratory failure with hypoxia in the setting of above Continue supplemental oxygen to maintain O2 sats > 92% with plan to wean patient off this as tolerated (patient does not use opioids on oxygen at baseline).  Hyponatremia Na 128. Continue to monitor sodium with serial BMPs Urine osmolality, serum osmolality and urine sodium will be checked  Transaminitis AST 66, ALT 70, ALP 152 Patient denies abdominal pain Continue to monitor on liver enzymes Consider RUQ ultrasound and hepatitis panel if liver enzymes continues to worsen  Chronically elevated BNP BNP 336 (this was 386 on 04/19/2023). Continue to monitor patient  Hypoalbuminemia possible secondary to moderate protein calorie malnutrition Albumin 2.8, protein supplement will be provided  Metastatic prostate cancer CT chest with contrast was suggestive of extensive sclerotic osseous lesions highly suggestive of metastatic prostate carcinoma PSA will be checked Consider urology  consultation  Essential hypertension Continue amlodipine, losartan  Mixed hyperlipidemia Statin will be held at this time due to elevated liver enzymes  GERD Continue Protonix  DVT prophylaxis: SCDs  Code Status: Full code  Family Communication: None at bedside  Consults: IR  Severity of Illness: The appropriate patient status for this patient is INPATIENT. Inpatient status is judged to be reasonable and necessary in order to provide the required intensity of service to ensure the patient's safety. The patient's presenting symptoms, physical exam findings, and initial radiographic and laboratory data in the context of their chronic comorbidities is felt to place them at high risk for further clinical deterioration. Furthermore, it is not anticipated that the patient will be medically stable for discharge from the hospital within 2 midnights of admission.   * I certify that at the point of admission it is my clinical judgment that the patient will require inpatient hospital care spanning beyond 2 midnights from the point of admission due to high intensity of service, high risk for further deterioration and high frequency of surveillance required.*  Author: Frankey Shown, DO 06/07/2023 1:04 AM  For on call review [email or web address].

## 2023-06-06 NOTE — Progress Notes (Signed)
 Pharmacy Antibiotic Note  Curtis Flores is a 68 y.o. male admitted on 06/06/2023 with pneumonia.  Pharmacy has been consulted for vancomycin dosing.  Plan: Give vancomycin 1500 mg IV x1 followed by 750 mg IV Q12H. Goal AUC 400-550. Expected AUC: 414.4 Expected Css min: 11.8 SCr used: 0.8  Weight used: IBW, Vd used: 0.72 (BMI 25.22) Patient also ordered cefepime 2 g IV x 1 Continue to monitor renal function and follow culture results Follow-up MRSA PCR for possible de-escalation of antibiotics       Temp (24hrs), Avg:99.2 F (37.3 C), Min:99.2 F (37.3 C), Max:99.2 F (37.3 C)  Recent Labs  Lab 06/06/23 1540  WBC 10.7*  CREATININE 0.54*    Estimated Creatinine Clearance: 79 mL/min (A) (by C-G formula based on SCr of 0.54 mg/dL (L)).    No Known Allergies  Antimicrobials this admission: 4/4 Vanc >>  4/4 cefepime x 1  Microbiology results: None  Thank you for allowing pharmacy to be a part of this patient's care.  Merryl Hacker, PharmD Clinical Pharmacist  06/06/2023 8:22 PM

## 2023-06-06 NOTE — ED Notes (Signed)
 ED TO INPATIENT HANDOFF REPORT  ED Nurse Name and Phone #: Sonny Masters RN  [phone]  S Name/Age/Gender Curtis Flores 68 y.o. male Room/Bed: APA10/APA10  Code Status   Code Status: Prior  Home/SNF/Other Home Patient oriented to: self, place, time, and situation Is this baseline? Yes   Triage Complete: Triage complete  Chief Complaint HCAP (healthcare-associated pneumonia) [J18.9]  Triage Note Pt with SOB for awhile, has tried inhaler and has helped some.  Pt with hx of COPD   Allergies No Known Allergies  Level of Care/Admitting Diagnosis ED Disposition     ED Disposition  Admit   Condition  --   Comment  Hospital Area: [facility] [[id number]]  Level of Care: Med-Surg [16]  May admit patient to [facility] or [facility] if equivalent level of care is available:: Yes  Covid Evaluation: Asymptomatic - no recent exposure (last 10 days) testing not required  Diagnosis: HCAP (healthcare-associated pneumonia) [454098]  Admitting Physician: Frankey Shown [[id number]]  Attending Physician: Frankey Shown [[id number]]  Certification:: I certify this patient will need inpatient services for at least 2 midnights  Expected Medical Readiness: 06/09/2023          B Medical/Surgery History Past Medical History:  Diagnosis Date   Abnormal CT scan, stomach    Chronic systolic dysfunction of left ventricle    Coronary artery disease    lateral STEMI 02/22/15 3/10 cutting balloon to ISR of the o/pLCX   GERD (gastroesophageal reflux disease)    Hyperlipidemia    Hypertension    Ischemic cardiomyopathy    LBBB (left bundle branch block)    Leukocytosis    followed by hematology, reactive   Lymphadenopathy    Myocardial infarction (HCC) 02/2015   Psoriasis 2003   Psoriatic arthritis (HCC)    Pulmonary fibrosis (HCC)    Rheumatoid arthritis(714.0) 2012   Typical atrial flutter (HCC)    Past Surgical History:  Procedure Laterality Date   CARDIAC  CATHETERIZATION N/A 02/22/2015   Procedure: Left Heart Cath and Coronary Angiography;  Surgeon: Runell Gess, MD;  Location: [facility] INVASIVE CV LAB;  Service: Cardiovascular;  Laterality: N/A;   CARDIAC CATHETERIZATION N/A 02/22/2015   Procedure: Coronary Stent Intervention;  Surgeon: Runell Gess, MD;  Location: [facility] INVASIVE CV LAB;  Service: Cardiovascular;  Laterality: N/A;   CARDIOVERSION N/A 04/12/2015   Procedure: CARDIOVERSION;  Surgeon: Laurey Morale, MD;  Location: [facility] ENDOSCOPY;  Service: Cardiovascular;  Laterality: N/A;   COLONOSCOPY  07/01/2003   YQM:VHQION colonic mucosa except for the proximal right colon in the area of ICV which was not seen completely due to inadequate bowel prep. followed with ACBE which was normal.    COLONOSCOPY N/A 08/24/2015   Dr. Jena Gauss: Normal colon. Next colonoscopy in 10 years.   CORONARY BALLOON ANGIOPLASTY N/A 05/12/2018   Procedure: CORONARY BALLOON ANGIOPLASTY;  Surgeon: Lyn Records, MD;  Location[facility] INVASIVE CV LAB;  Service: Cardiovascular;  Laterality: N/A;   ELECTROPHYSIOLOGIC STUDY N/A 05/30/2015   Atrial fibrillation ablation by Dr Johney Frame   ESOPHAGOGASTRODUODENOSCOPY N/A 08/24/2015   Dr. Jena Gauss: Medium-sized hiatal hernia, erosive gastropathy. Cameron lesions. Esophageal mucosa distally suggestive of short segment Barrett's esophagus. Not confirmed on biopsy. Gastric biopsy with minimal chronic inflammation   GIVENS CAPSULE STUDY N/A 04/17/2016   Procedure: GIVENS CAPSULE STUDY;  Surgeon: Corbin Ade, MD;  Location: [facility] ENDO [facility];  Service: Endoscopy;  Laterality: N/A;  Pt to arrive at 8:00 am for 8:30 am appt  LEFT HEART CATH AND CORONARY ANGIOGRAPHY N/A 05/11/2018   Procedure: LEFT HEART CATH AND CORONARY ANGIOGRAPHY;  Surgeon: Lyn Records, MD;  Location: [facility] INVASIVE CV LAB;  Service: Cardiovascular;  Laterality: N/A;   TEE WITHOUT CARDIOVERSION N/A 04/12/2015   Procedure: TRANSESOPHAGEAL ECHOCARDIOGRAM (TEE);  Surgeon: Laurey Morale, MD;   Location: [facility] ENDOSCOPY;  Service: Cardiovascular;  Laterality: N/A;     A IV Location/Drains/Wounds Patient Lines/Drains/Airways Status     Active Line/Drains/Airways     Name Placement date Placement time Site Days   Peripheral IV 06/06/23 20 G 1" Right Antecubital 06/06/23  1703  Antecubital  less than 1            Intake/Output Last 24 hours No intake or output data in the 24 hours ending 06/06/23 2123  Labs/Imaging Results for orders placed or performed during the hospital encounter of 06/06/23 (from the past 48 hours)  Resp panel by RT-PCR (RSV, Flu A&B, Covid) Anterior Nasal Swab     Status: None   Collection Time: 06/06/23  3:28 PM   Specimen: Anterior Nasal Swab  Result Value Ref Range   SARS Coronavirus 2 by RT PCR NEGATIVE NEGATIVE    Comment: (NOTE) SARS-CoV-2 target nucleic acids are NOT DETECTED.  The SARS-CoV-2 RNA is generally detectable in upper respiratory specimens during the acute phase of infection. The lowest concentration of SARS-CoV-2 viral copies this assay can detect is 138 copies/mL. A negative result does not preclude SARS-Cov-2 infection and should not be used as the sole basis for treatment or other patient management decisions. A negative result may occur with  improper specimen collection/handling, submission of specimen other than nasopharyngeal swab, presence of viral mutation(s) within the areas targeted by this assay, and inadequate number of viral copies(<138 copies/mL). A negative result must be combined with clinical observations, patient history, and epidemiological information. The expected result is Negative.  Fact Sheet for Patients:  BloggerCourse.com  Fact Sheet for Healthcare Providers:  SeriousBroker.it  This test is no t yet approved or cleared by the [place] FDA and  has been authorized for detection and/or diagnosis of SARS-CoV-2 by FDA under an Emergency Use  Authorization (EUA). This EUA will remain  in effect (meaning this test can be used) for the duration of the COVID-19 declaration under Section 564(b)(1) of the Act, 21 U.S.C.section 360bbb-3(b)(1), unless the authorization is terminated  or revoked sooner.       Influenza A by PCR NEGATIVE NEGATIVE   Influenza B by PCR NEGATIVE NEGATIVE    Comment: (NOTE) The Xpert Xpress SARS-CoV-2/FLU/RSV plus assay is intended as an aid in the diagnosis of influenza from Nasopharyngeal swab specimens and should not be used as a sole basis for treatment. Nasal washings and aspirates are unacceptable for Xpert Xpress SARS-CoV-2/FLU/RSV testing.  Fact Sheet for Patients: BloggerCourse.com  Fact Sheet for Healthcare Providers: SeriousBroker.it  This test is not yet approved or cleared by the [place] FDA and has been authorized for detection and/or diagnosis of SARS-CoV-2 by FDA under an Emergency Use Authorization (EUA). This EUA will remain in effect (meaning this test can be used) for the duration of the COVID-19 declaration under Section 564(b)(1) of the Act, 21 U.S.C. section 360bbb-3(b)(1), unless the authorization is terminated or revoked.     Resp Syncytial Virus by PCR NEGATIVE NEGATIVE    Comment: (NOTE) Fact Sheet for Patients: BloggerCourse.com  Fact Sheet for Healthcare Providers: SeriousBroker.it  This test is not yet approved or cleared by the  [place] [employer] trader and has been authorized for detection and/or diagnosis of SARS-CoV-2 by FDA under an TEFL teacher (EUA). This EUA will remain in effect (meaning this test can be used) for the duration of the COVID-19 declaration under Section 564(b)(1) of the Act, 21 U.S.C. section 360bbb-3(b)(1), unless the authorization is terminated or revoked.  Performed at [facility], [address], [place], [place]  [address]   CBC with Differential     Status: Abnormal   Collection Time: 06/06/23  3:40 PM  Result Value Ref Range   WBC 10.7 (H) 4.0 - 10.5 K/uL   RBC 4.55 4.22 - 5.81 MIL/uL   Hemoglobin 12.6 (L) 13.0 - 17.0 g/dL   HCT 19.1 47.8 - 29.5 %   MCV 86.6 80.0 - 100.0 fL   MCH 27.7 26.0 - 34.0 pg   MCHC 32.0 30.0 - 36.0 g/dL   RDW 62.1 (H) 30.8 - 65.7 %   Platelets 272 150 - 400 K/uL   nRBC 0.2 0.0 - 0.2 %   Neutrophils Relative % 71 %   Neutro Abs 7.6 1.7 - 7.7 K/uL   Lymphocytes Relative 13 %   Lymphs Abs 1.4 0.7 - 4.0 K/uL   Monocytes Relative 15 %   Monocytes Absolute 1.6 (H) 0.1 - 1.0 K/uL   Eosinophils Relative 0 %   Eosinophils Absolute 0.0 0.0 - 0.5 K/uL   Basophils Relative 0 %   Basophils Absolute 0.0 0.0 - 0.1 K/uL   Immature Granulocytes 1 %   Abs Immature Granulocytes 0.07 0.00 - 0.07 K/uL    Comment: Performed at [facility], [address] [place], [place] [address]  Comprehensive metabolic panel     Status: Abnormal   Collection Time: 06/06/23  3:40 PM  Result Value Ref Range   Sodium 128 (L) 135 - 145 mmol/L   Potassium 5.1 3.5 - 5.1 mmol/L   Chloride 88 (L) 98 - 111 mmol/L   CO2 29 22 - 32 mmol/L   Glucose, Bld 88 70 - 99 mg/dL    Comment: Glucose reference range applies only to samples taken after fasting for at least 8 hours.   BUN 12 8 - 23 mg/dL   Creatinine, Ser 2.95 (L) 0.61 - 1.24 mg/dL   Calcium 9.2 8.9 - 28.4 mg/dL   Total Protein 7.1 6.5 - 8.1 g/dL   Albumin 2.8 (L) 3.5 - 5.0 g/dL   AST 66 (H) 15 - 41 U/L   ALT 70 (H) 0 - 44 U/L   Alkaline Phosphatase 152 (H) 38 - 126 U/L   Total Bilirubin 0.9 0.0 - 1.2 mg/dL   GFR, Estimated >13 >24 mL/min    Comment: (NOTE) Calculated using the CKD-EPI Creatinine Equation (2021)    Anion gap 11 5 - 15    Comment: Performed at [facility], [address] [place], [place] [address]  Brain natriuretic peptide     Status: Abnormal   Collection Time: 06/06/23  3:40 PM  Result Value Ref Range   B Natriuretic  Peptide 336.0 (H) 0.0 - 100.0 pg/mL    Comment: Performed at [facility], [address], [place], [place] [address]  Blood culture (routine x 2)     Status: None (Preliminary result)   Collection Time: 06/06/23  8:44 PM   Specimen: BLOOD RIGHT HAND  Result Value Ref Range   Specimen Description BLOOD RIGHT HAND    Special Requests      BOTTLES DRAWN AEROBIC AND ANAEROBIC Blood  Culture adequate volume Performed at [facility], [address], [place], [place] [address]    Culture PENDING    Report Status PENDING   Blood culture (routine x 2)     Status: None (Preliminary result)   Collection Time: 06/06/23  8:51 PM   Specimen: Left Antecubital; Blood  Result Value Ref Range   Specimen Description LEFT ANTECUBITAL    Special Requests      BOTTLES DRAWN AEROBIC AND ANAEROBIC Blood Culture adequate volume Performed at [facility], [address] [place], [place] [address]    Culture PENDING    Report Status PENDING    CT Chest W Contrast Result Date: 06/06/2023 CLINICAL DATA:  Follow-up left-sided pleural effusion EXAM: CT CHEST WITH CONTRAST TECHNIQUE: Multidetector CT imaging of the chest was performed during intravenous contrast administration. RADIATION DOSE REDUCTION: This exam was performed according to the departmental dose-optimization program which includes automated exposure control, adjustment of the mA and/or kV according to patient size and/or use of iterative reconstruction technique. CONTRAST:  75mL OMNIPAQUE IOHEXOL 300 MG/ML  SOLN COMPARISON:  05/26/2023, 04/19/2023 FINDINGS: Cardiovascular: Atherosclerotic calcifications of the thoracic aorta are noted without aneurysmal dilatation or dissection. Heavy coronary calcifications are seen. The heart is at the upper limits of normal in size. No sizable central pulmonary emboli are noted. Mediastinum/Nodes: Thoracic inlet is within normal limits. Scattered likely reactive lymph nodes are noted in the mediastinum similar to that  seen on the prior exam. The largest of these is in the subcarinal region measuring 12 mm slightly decreased when compared with the prior exam. The hilar nodes are also slightly decreased in size when compared with the prior study. The esophagus is air-filled and within normal limits. A small sliding-type hiatal hernia is noted. Lungs/Pleura: Bilateral pleural effusions are again identified left worse than right with fluid trapped in the major fissure on the left. The right-sided effusion is stable when compared with 05/26/2023 but increased when compared with the prior exam from February of 2025. Persistent diffuse fibrotic changes are identified in both lungs with some area of adjacent consolidation particularly in the left lower lobe also stable from the prior study. No new parenchymal nodules or areas of consolidation are noted. Upper Abdomen: Visualized upper abdomen shows no acute abnormality. Some scarring in the right kidney is noted. Musculoskeletal: Multiple sclerotic lesions are again identified throughout the bony structures to include the ribcage, thoracic spine and proximal humeri bilaterally. Scapular and sternal lesions are noted as well. These are again highly suggestive of metastatic prostate disease. Correlation with PSA is recommended. IMPRESSION: Overall stable appearance of the pleural effusions bilaterally left greater than right. Persistent fibrotic change and left lower lobe consolidation is seen and stable. No new areas of consolidation are noted. Hiatal hernia. Extensive sclerotic osseous lesions highly suggestive of metastatic prostate carcinoma. Correlate with serum PSA. These are relatively stable when compared with the prior exam. Hilar and mediastinal lymph nodes likely reactive in nature and slightly decreased when compared with the prior exam. Aortic Atherosclerosis (ICD10-I70.0). Electronically Signed   By: Alcide Clever M.D.   On: 06/06/2023 19:32   DG Chest 2 View Result Date:  06/06/2023 CLINICAL DATA:  Shortness of breath. EXAM: CHEST - 2 VIEW COMPARISON:  04/19/2023. FINDINGS: Re-demonstration of left retrocardiac airspace opacity obscuring the left hemidiaphragm, descending thoracic aorta and blunting the left lateral costophrenic angle, suggesting combination of left lung atelectasis and/or consolidation with pleural effusion. No significant interval change. There also diffuse reticulonodular opacities throughout  bilateral lungs, similar to prior study and corresponds to underlying chronic interstitial lung disease. No acute consolidation or lung collapse. There is trace right pleural effusion as well, unchanged. Stable cardio-mediastinal silhouette. No acute osseous abnormalities. The soft tissues are within normal limits. IMPRESSION: *No significant interval change since the prior study. Redemonstration of left retrocardiac airspace opacity, likely combination of left lung atelectasis and/or consolidation with pleural effusion. Electronically Signed   By: Jules Schick M.D.   On: 06/06/2023 15:22    Pending Labs Unresulted Labs (From admission, onward)     Start     Ordered   06/06/23 2023  MRSA Next Gen by PCR, Nasal  (MRSA Screening)  Once,   URGENT        06/06/23 2023            Vitals/Pain Today's Vitals   06/06/23 1630 06/06/23 1915 06/06/23 2100 06/06/23 2105  BP: 134/62 (!) 149/60 133/63   Pulse: 64 66 67   Resp: (!) 26 (!) 23 (!) 24   Temp:    98.9 F (37.2 C)  TempSrc:    Oral  SpO2: 93% 94% 92%   PainSc:        Isolation Precautions No active isolations  Medications Medications  ceFEPIme (MAXIPIME) 2 g in sodium chloride 0.9 % 100 mL IVPB (2 g Intravenous New Bag/Given 06/06/23 2055)  methylPREDNISolone sodium succinate (SOLU-MEDROL) 40 mg/mL injection 40 mg (40 mg Intravenous Given 06/06/23 2054)  vancomycin (VANCOREADY) IVPB 1500 mg/300 mL (has no administration in time range)    Followed by  vancomycin (VANCOREADY) IVPB 750 mg/150 mL  (has no administration in time range)  iohexol (OMNIPAQUE) 300 MG/ML solution 75 mL (75 mLs Intravenous Contrast Given 06/06/23 1838)    Mobility walks     Focused Assessments Pulmonary Assessment Handoff:  Lung sounds: L Breath Sounds: Diminished (at base) R Breath Sounds: Diminished        R Recommendations: See Admitting Provider Note  Report given to:   Additional Notes: Pt is pleasant, alert and oriented. On 1L [place] for comfort.  Sats without about 90%

## 2023-06-06 NOTE — ED Notes (Signed)
 [facility] at bedside

## 2023-06-06 NOTE — ED Provider Notes (Signed)
 [place] [facility]ERGENCY DEPARTMENT AT [facility] Provider Note   CSN: [id number] Arrival date & time: 06/06/23  1403     History  Chief Complaint  Patient presents with   Shortness of Breath    Curtis Flores is a 68 y.o. male.  68 year old male with a history of interstitial lung disease, CAD status post PCI, HFrEF, RA, and reported COPD who presents emergency department shortness of breath.  Patient reports that he was diagnosed with pneumonia on 04/19/2023.  Says that since then he has had persistent symptoms which include fevers and chills, productive cough, and shortness of breath.  Denies any chest pain.  No leg swelling.  Says that his symptoms are also worse today so he decided to come into the emergency department for evaluation.       Home Medications Prior to Admission medications   Medication Sig Start Date End Date Taking? Authorizing Provider  doxycycline (ADOXA) 100 MG tablet Take 100 mg by mouth 2 (two) times daily. 05/15/23  Yes [provider]  Abatacept (ORENCIA CLICKJECT) 125 MG/ML SOAJ Inject 125 mg into the skin once a week. Patient not taking: Reported on 04/08/2023 12/31/22   Gearldine Bienenstock, PA-C  albuterol (VENTOLIN HFA) 108 (90 Base) MCG/ACT inhaler Inhale 2 puffs into the lungs every 6 (six) hours as needed for wheezing or shortness of breath. 03/12/23   Oretha Milch, MD  amLODipine (NORVASC) 5 MG tablet Take 1 tablet (5 mg total) by mouth daily. 04/24/23   Runell Gess, MD  apixaban (ELIQUIS) 5 MG TABS tablet TAKE 1 TABLET TWICE DAILY 05/15/23   Runell Gess, MD  atorvastatin (LIPITOR) 80 MG tablet TAKE 1 TABLET EVERY DAY AT 6 PM (APPOINTMENT IS NEEDED) 05/21/22   Runell Gess, MD  azaTHIOprine (IMURAN) 50 MG tablet TAKE 3 TABLETS EVERY DAY 03/31/23   Gearldine Bienenstock, PA-C  clobetasol (TEMOVATE) 0.05 % external solution Apply 1 Application topically 2 (two) times daily. 01/24/23   Gearldine Bienenstock, PA-C  Clobetasol Prop Emollient Base  (CLOBETASOL PROPIONATE E) 0.05 % emollient cream APPLY TO AFFECTED AREA TWICE A DAY AS NEEDED 04/04/23   Gearldine Bienenstock, PA-C  clopidogrel (PLAVIX) 75 MG tablet Take 1 tablet (75 mg total) by mouth daily. 04/24/23   Runell Gess, MD  furosemide (LASIX) 40 MG tablet TAKE 1 TABLET TWICE DAILY 06/17/22   Runell Gess, MD  levofloxacin (LEVAQUIN) 750 MG tablet Take 1 tablet (750 mg total) by mouth daily. X 7 days 04/19/23   Bethann Berkshire, MD  losartan (COZAAR) 100 MG tablet TAKE 1 TABLET EVERY DAY 05/21/22   Runell Gess, MD  pantoprazole (PROTONIX) 40 MG tablet TAKE 1 TABLET TWICE DAILY 11/02/20   Gelene Mink, NP  potassium chloride SA (KLOR-CON M) 20 MEQ tablet TAKE 2 TABLETS TWICE DAILY 10/31/22   Runell Gess, MD  predniSONE (DELTASONE) 5 MG tablet TAKE 1 TABLET DAILY WITH BREAKFAST. 04/16/23   Gearldine Bienenstock, PA-C  sotalol (BETAPACE) 120 MG tablet TAKE 1 TABLET EVERY 12 HOURS 10/31/22   Runell Gess, MD      Allergies    Patient has no known allergies.    Review of Systems   Review of Systems  Physical Exam Updated Vital Signs BP (!) 117/58   Pulse 84   Temp (!) 97.5 F (36.4 C) (Oral)   Resp 18   SpO2 98%  Physical Exam Vitals and nursing note reviewed.  Constitutional:      General: He is not in acute distress.    Appearance: He is well-developed.  HENT:     Head: Normocephalic and atraumatic.     Right Ear: External ear normal.     Left Ear: External ear normal.     Nose: Nose normal.  Eyes:     Extraocular Movements: Extraocular movements intact.     Conjunctiva/sclera: Conjunctivae normal.     Pupils: Pupils are equal, round, and reactive to light.  Cardiovascular:     Rate and Rhythm: Normal rate and regular rhythm.     Heart sounds: Normal heart sounds.  Pulmonary:     Effort: Pulmonary effort is normal. No respiratory distress.     Breath sounds: Rales (Bibasilar right greater than left) present.     Comments: On 2 L nasal cannula.  Speaking in  full sentences.  Not in respiratory distress. Abdominal:     General: There is no distension.     Palpations: Abdomen is soft. There is no mass.     Tenderness: There is no abdominal tenderness. There is no guarding.  Musculoskeletal:     Cervical back: Normal range of motion and neck supple.     Right lower leg: No edema.     Left lower leg: No edema.  Skin:    General: Skin is warm and dry.  Neurological:     Mental Status: He is alert. Mental status is at baseline.  Psychiatric:        Mood and Affect: Mood normal.        Behavior: Behavior normal.     ED Results / Procedures / Treatments   Labs (all labs ordered are listed, but only abnormal results are displayed) Labs Reviewed  CBC WITH DIFFERENTIAL/PLATELET - Abnormal; Notable for the following components:      Result Value   WBC 10.7 (*)    Hemoglobin 12.6 (*)    RDW 17.6 (*)    Monocytes Absolute 1.6 (*)    All other components within normal limits  COMPREHENSIVE METABOLIC PANEL WITH GFR - Abnormal; Notable for the following components:   Sodium 128 (*)    Chloride 88 (*)    Creatinine, Ser 0.54 (*)    Albumin 2.8 (*)    AST 66 (*)    ALT 70 (*)    Alkaline Phosphatase 152 (*)    All other components within normal limits  BRAIN NATRIURETIC PEPTIDE - Abnormal; Notable for the following components:   B Natriuretic Peptide 336.0 (*)    All other components within normal limits  COMPREHENSIVE METABOLIC PANEL WITH GFR - Abnormal; Notable for the following components:   Sodium 133 (*)    Chloride 95 (*)    Creatinine, Ser 0.54 (*)    Albumin 2.6 (*)    AST 61 (*)    ALT 68 (*)    Alkaline Phosphatase 165 (*)    All other components within normal limits  CBC - Abnormal; Notable for the following components:   WBC 12.3 (*)    RDW 17.7 (*)    All other components within normal limits  LACTATE DEHYDROGENASE, PLEURAL OR PERITONEAL FLUID - Abnormal; Notable for the following components:   LD, Fluid 319 (*)    All  other components within normal limits  RESP PANEL BY RT-PCR (RSV, FLU A&B, COVID)  RVPGX2  CULTURE, BLOOD (ROUTINE X 2)  CULTURE, BLOOD (ROUTINE X 2)  MRSA NEXT GEN BY PCR, NASAL  CULTURE, BLOOD (ROUTINE X 2)  CULTURE, BLOOD (ROUTINE X 2)  EXPECTORATED SPUTUM ASSESSMENT W GRAM STAIN, RFLX TO RESP C  MAGNESIUM  PHOSPHORUS  OSMOLALITY  ALBUMIN, PLEURAL OR PERITONEAL FLUID   PROTEIN, PLEURAL OR PERITONEAL FLUID  GLUCOSE, PLEURAL OR PERITONEAL FLUID  LEGIONELLA PNEUMOPHILA SEROGP 1 UR AG  STREP PNEUMONIAE URINARY ANTIGEN  PSA  CYTOLOGY - NON PAP    EKG EKG Interpretation Date/Time:  Friday June 06 2023 16:07:27 EDT Ventricular Rate:  64 PR Interval:  197 QRS Duration:  141 QT Interval:  401 QTC Calculation: 414 R Axis:   3  Text Interpretation: Sinus rhythm Left bundle branch block Confirmed by Vonita Moss [id number]) on 06/06/2023 4:52:05 PM  Radiology DG Chest 1 View Addendum Date: 06/07/2023 ADDENDUM REPORT: 06/07/2023 12:24 ADDENDUM: Study discussed by telephone with Dr. Tyson Babinski on 06/07/2023 at 1214 hours. Electronically Signed   By: Odessa Fleming M.D.   On: 06/07/2023 12:24   Result Date: 06/07/2023 CLINICAL DATA:  68 year old male status post ultrasound-guided left side thoracentesis this morning. EXAM: CHEST  1 VIEW COMPARISON:  Chest CT yesterday. FINDINGS: AP view of the chest 1020 hours. White out of the left lung now, but slightly leftward shift of the mediastinum when compared to the CT scout view yesterday. No pneumothorax is identified. Right lung ventilation with patchy and coarse opacity is stable. Visualized tracheal air column is within normal limits. Negative visible bowel gas. No acute osseous abnormality identified. IMPRESSION: 1. Abnormal white out of the left lung since yesterday, but with some leftward mediastinal shift since that time suggesting this is mucous plugging and lung collapse. 2. No pneumothorax is identified. Electronically Signed: By: Odessa Fleming M.D. On:  06/07/2023 11:57   US Abdomen Limited RUQ (LIVER/GB) Result Date: 06/07/2023 CLINICAL DATA:  Elevated liver function tests EXAM: ULTRASOUND ABDOMEN LIMITED RIGHT UPPER QUADRANT COMPARISON:  None Available. FINDINGS: Gallbladder: No gallstones or wall thickening visualized. No sonographic Murphy sign noted by sonographer. Common bile duct: Diameter: 5 mm Liver: No focal lesion identified. Within normal limits in parenchymal echogenicity. Portal vein is patent on color Doppler imaging with normal direction of blood flow towards the liver. Other: Small right pleural effusion. IMPRESSION: No gallstones or ductal dilatation. Small right pleural effusion. Please correlate with chest CT of 06/06/2023 Electronically Signed   By: Karen Kays M.D.   On: 06/07/2023 12:01   US THORACENTESIS ASP PLEURAL SPACE W/IMG GUIDE Result Date: 06/07/2023 INDICATION: 68 year old male. Recent history of pneumonia. Admitted for shortness of breath team is requesting a therapeutic and diagnostic left-sided thoracentesis EXAM: ULTRASOUND GUIDED THERAPEUTIC AND DIAGNOSTIC LEFT-SIDED THORACENTESIS MEDICATIONS: Lidocaine 1% 10 mL COMPLICATIONS: None immediate. PROCEDURE: An ultrasound guided thoracentesis was thoroughly discussed with the patient and questions answered. The benefits, risks, alternatives and complications were also discussed. The patient understands and wishes to proceed with the procedure. Written consent was obtained. Ultrasound was performed to localize and mark an adequate pocket of fluid in the left chest. The area was then prepped and draped in the normal sterile fashion. 1% Lidocaine was used for local anesthesia. Under ultrasound guidance a 6 Fr Safe-T-Centesis catheter was introduced. Thoracentesis was performed. The catheter was removed and a dressing applied. FINDINGS: A total of approximately 250 mL of amber color fluid was removed. Samples were sent to the laboratory as requested by the clinical team. IMPRESSION:  Successful ultrasound guided therapeutic and diagnostic left-sided thoracentesis yielding 250 mL amber: pleural fluid. Performed by Anders Grant NP Electronically Signed  By: Gilmer Mor D.O.   On: 06/07/2023 11:19   CT Chest W Contrast Result Date: 06/06/2023 CLINICAL DATA:  Follow-up left-sided pleural effusion EXAM: CT CHEST WITH CONTRAST TECHNIQUE: Multidetector CT imaging of the chest was performed during intravenous contrast administration. RADIATION DOSE REDUCTION: This exam was performed according to the departmental dose-optimization program which includes automated exposure control, adjustment of the mA and/or kV according to patient size and/or use of iterative reconstruction technique. CONTRAST:  75mL OMNIPAQUE IOHEXOL 300 MG/ML  SOLN COMPARISON:  05/26/2023, 04/19/2023 FINDINGS: Cardiovascular: Atherosclerotic calcifications of the thoracic aorta are noted without aneurysmal dilatation or dissection. Heavy coronary calcifications are seen. The heart is at the upper limits of normal in size. No sizable central pulmonary emboli are noted. Mediastinum/Nodes: Thoracic inlet is within normal limits. Scattered likely reactive lymph nodes are noted in the mediastinum similar to that seen on the prior exam. The largest of these is in the subcarinal region measuring 12 mm slightly decreased when compared with the prior exam. The hilar nodes are also slightly decreased in size when compared with the prior study. The esophagus is air-filled and within normal limits. A small sliding-type hiatal hernia is noted. Lungs/Pleura: Bilateral pleural effusions are again identified left worse than right with fluid trapped in the major fissure on the left. The right-sided effusion is stable when compared with 05/26/2023 but increased when compared with the prior exam from February of 2025. Persistent diffuse fibrotic changes are identified in both lungs with some area of adjacent consolidation particularly in the  left lower lobe also stable from the prior study. No new parenchymal nodules or areas of consolidation are noted. Upper Abdomen: Visualized upper abdomen shows no acute abnormality. Some scarring in the right kidney is noted. Musculoskeletal: Multiple sclerotic lesions are again identified throughout the bony structures to include the ribcage, thoracic spine and proximal humeri bilaterally. Scapular and sternal lesions are noted as well. These are again highly suggestive of metastatic prostate disease. Correlation with PSA is recommended. IMPRESSION: Overall stable appearance of the pleural effusions bilaterally left greater than right. Persistent fibrotic change and left lower lobe consolidation is seen and stable. No new areas of consolidation are noted. Hiatal hernia. Extensive sclerotic osseous lesions highly suggestive of metastatic prostate carcinoma. Correlate with serum PSA. These are relatively stable when compared with the prior exam. Hilar and mediastinal lymph nodes likely reactive in nature and slightly decreased when compared with the prior exam. Aortic Atherosclerosis (ICD10-I70.0). Electronically Signed   By: Alcide Clever M.D.   On: 06/06/2023 19:32   DG Chest 2 View Result Date: 06/06/2023 CLINICAL DATA:  Shortness of breath. EXAM: CHEST - 2 VIEW COMPARISON:  04/19/2023. FINDINGS: Re-demonstration of left retrocardiac airspace opacity obscuring the left hemidiaphragm, descending thoracic aorta and blunting the left lateral costophrenic angle, suggesting combination of left lung atelectasis and/or consolidation with pleural effusion. No significant interval change. There also diffuse reticulonodular opacities throughout bilateral lungs, similar to prior study and corresponds to underlying chronic interstitial lung disease. No acute consolidation or lung collapse. There is trace right pleural effusion as well, unchanged. Stable cardio-mediastinal silhouette. No acute osseous abnormalities. The soft  tissues are within normal limits. IMPRESSION: *No significant interval change since the prior study. Redemonstration of left retrocardiac airspace opacity, likely combination of left lung atelectasis and/or consolidation with pleural effusion. Electronically Signed   By: Jules Schick M.D.   On: 06/06/2023 15:22    Procedures Procedures    Medications Ordered in ED Medications  acetaminophen (TYLENOL) tablet 650 mg (has no administration in time range)    Or  acetaminophen (TYLENOL) suppository 650 mg (has no administration in time range)  ondansetron (ZOFRAN) tablet 4 mg (has no administration in time range)    Or  ondansetron (ZOFRAN) injection 4 mg (has no administration in time range)  dextromethorphan-guaiFENesin (MUCINEX DM) 30-600 MG per 12 hr tablet 1 tablet (1 tablet Oral Given 06/07/23 0851)  feeding supplement (ENSURE ENLIVE / ENSURE PLUS) liquid 237 mL (237 mLs Oral Not Given 06/07/23 0858)  amLODipine (NORVASC) tablet 5 mg (5 mg Oral Given 06/07/23 0851)  losartan (COZAAR) tablet 100 mg (100 mg Oral Given 06/07/23 0851)  pantoprazole (PROTONIX) EC tablet 40 mg (40 mg Oral Given 06/07/23 0851)  ceFEPIme (MAXIPIME) 2 g in sodium chloride 0.9 % 100 mL IVPB (2 g Intravenous New Bag/Given 06/07/23 0529)  lidocaine (PF) (XYLOCAINE) 1 % injection 1 mL (has no administration in time range)  azithromycin (ZITHROMAX) 500 mg in sodium chloride 0.9 % 250 mL IVPB (500 mg Intravenous New Bag/Given 06/07/23 1312)  methylPREDNISolone sodium succinate (SOLU-MEDROL) 40 mg/mL injection 40 mg (has no administration in time range)  sodium chloride HYPERTONIC 3 % nebulizer solution 4 mL (4 mLs Nebulization Not Given 06/07/23 1441)  ipratropium-albuterol (DUONEB) 0.5-2.5 (3) MG/3ML nebulizer solution 3 mL (3 mLs Nebulization Not Given 06/07/23 1441)  iohexol (OMNIPAQUE) 300 MG/ML solution 75 mL (75 mLs Intravenous Contrast Given 06/06/23 1838)  ceFEPIme (MAXIPIME) 2 g in sodium chloride 0.9 % 100 mL IVPB (0 g  Intravenous Stopped 06/06/23 2125)  vancomycin (VANCOREADY) IVPB 1500 mg/300 mL (1,500 mg Intravenous New Bag/Given 06/06/23 2142)  furosemide (LASIX) injection 40 mg (40 mg Intravenous Given 06/07/23 1312)    ED Course/ Medical Decision Making/ A&P Clinical Course as of 06/07/23 1450  Fri Jun 06, 2023  1638 Sodium(!): 128 Baseline 138 [RP]  2021 Dw Dr Vassie Loll from pulmonology. Recommends broad spectrum abx and admission.  Also recommends every 8 hour Solu-Medrol 40 mg.  Also feels that his pleural effusion may need to be drained. [RP]  2033 Dr Thomes Dinning from hospitalist.  [RP]    Clinical Course User Index [RP] Rondel Baton, MD                                 Medical Decision Making Amount and/or Complexity of Data Reviewed Labs: ordered. Decision-making details documented in ED Course. Radiology: ordered.  Risk Prescription drug management. Decision regarding hospitalization.   JAKUB DEBOLD is a 68 y.o. male with comorbidities that complicate the patient evaluation including interstitial lung disease, CAD status post PCI, HFrEF, RA, and reported COPD who presents emergency department shortness of breath.    Initial Ddx:  URI, pneumonia, PE, MI, pleural effusion, CHF exacerbation, COPD  MDM/Course:  Patient presents emergency department with shortness of breath.  Says has been present since being diagnosed with community-acquired pneumonia several weeks ago.  On exam is nonseptic appearing.  Does have bibasilar Rales.  No significant wheezing to suggest COPD.  Had chest x-ray which shows a pleural effusion.  Did obtain a CT scan to assess for empyema or other complication.  It appears to be stable from prior.  Discussed with pulmonology since I believe his symptoms are predominantly coming from worsening of his interstitial lung disease but in the setting of the pneumonia that he had recently.  They felt that the patient should be started on  broad-spectrum antibiotics as well as IV  steroids.  Admitted to hospitalist for further management.  Upon re-evaluation remained stable on his 2 L nasal cannula.  Was noted to have multiple bony metastases concerning for possible metastatic prostate cancer.  Patient was informed of these findings.  This patient presents to the ED for concern of complaints listed in HPI, this involves an extensive number of treatment options, and is a complaint that carries with it a high risk of complications and morbidity. Disposition including potential need for admission considered.   Dispo: Admit to Floor  Additional history obtained from family Records reviewed Outpatient Clinic Notes The following labs were independently interpreted: Chemistry and show  elevated LFTs concerning for possible liver metastases. I independently reviewed the following imaging with scope of interpretation limited to determining acute life threatening conditions related to emergency care: Chest x-ray and agree with the radiologist interpretation with the following exceptions: none I personally reviewed and interpreted cardiac monitoring: normal sinus rhythm  I personally reviewed and interpreted the pt's EKG: see above for interpretation  I have reviewed the patients home medications and made adjustments as needed Consults: Hospitalist and pulmonology Social Determinants of health:  geriatric  Portions of this note were generated with Scientist, clinical (histocompatibility and immunogenetics). Dictation errors may occur despite best attempts at proofreading.     Final Clinical Impression(s) / ED Diagnoses Final diagnoses:  Hypoxia  ILD (interstitial lung disease) (HCC)  SOB (shortness of breath)  Lytic bone lesions on xray    Rx / DC Orders ED Discharge Orders     None         Rondel Baton, MD 06/07/23 1450

## 2023-06-06 NOTE — H&P (Incomplete)
 History and Physical    Patient: Curtis Flores HQI:[id number] DOB: 08-31-1955 DOA: 06/06/2023 DOS: the patient was seen and examined on 06/06/2023 PCP: Ladora Daniel, PA-C  Patient coming from: Home  Chief Complaint:  Chief Complaint  Patient presents with  . Shortness of Breath   HPI: Curtis Flores is a 68 y.o. male with medical history significant of hypertension, hyperlipidemia, pulmonary fibrosis, rheumatoid arthritis, atrial flutter, chronic HFrEF who presents to the emergency department due to shortness of breath.  Patient states that he was diagnosed with left lower lobe pneumonia when he presented to the ED on 04/19/2023, he was discharged with Levaquin at that time.  Patient states that he has been having persistent symptoms of shortness of breath, subjective fever, chills, productive cough since then.  Symptoms worsened today, so he decided to go to the ED for further evaluation and management.  Patient denies chest pain or leg swelling.  ED Course:  In the emergency department, patient was tachypneic with respiratory rate of 24/min, pulse 58 bpm, BP 122/48, temperature 99.2 F, O2 sat 90% on room air.  Workup in the ED showed normal CBC except for WBC of 10.7 and hemoglobin of 12.6.  BMP was normal except for sodium of 128, chloride 88, creatinine 0.54, albumin 2.8, AST 66, ALT 70, ALP 152, BNP 336 (this was 336 on 04/19/2023).  Influenza A, B, SARS, rest 2, RSV was negative.  Blood culture was pending. CT chest with contrast overall  stable appearance of the pleural effusions bilaterally left greater than right. Persistent fibrotic change and left lower lobe consolidation is seen and stable.  Chest x-ray showed redemonstration of left retrocardiac airspace opacity, likely combination of left lung atelectasis and/or consolidation with pleural effusion. Pulmonologist (Dr. Vassie Loll) was consulted and recommended broad-spectrum antibiotic, Solu-Medrol every 8 hours and to admit the patient.  They  also suggested that patient will need drainage of his pleural effusion. He was treated with IV vancomycin and cefepime, IV Solu-Medrol was started. Hospitalist was asked to admit patient for further evaluation and management.  Review of Systems: Review of systems as noted in the HPI. All other systems reviewed and are negative.   Past Medical History:  Diagnosis Date  . Abnormal CT scan, stomach   . Chronic systolic dysfunction of left ventricle   . Coronary artery disease    lateral STEMI 02/22/15 3/10 cutting balloon to ISR of the o/pLCX  . GERD (gastroesophageal reflux disease)   . Hyperlipidemia   . Hypertension   . Ischemic cardiomyopathy   . LBBB (left bundle branch block)   . Leukocytosis    followed by hematology, reactive  . Lymphadenopathy   . Myocardial infarction (HCC) 02/2015  . Psoriasis 2003  . Psoriatic arthritis (HCC)   . Pulmonary fibrosis (HCC)   . Rheumatoid arthritis(714.0) 2012  . Typical atrial flutter [facility])    Past Surgical History:  Procedure Laterality Date  . CARDIAC CATHETERIZATION N/A 02/22/2015   Procedure: Left Heart Cath and Coronary Angiography;  Surgeon: Runell Gess, MD;  Location: [facility] INVASIVE CV LAB;  Service: Cardiovascular;  Laterality: N/A;  . CARDIAC CATHETERIZATION N/A 02/22/2015   Procedure: Coronary Stent Intervention;  Surgeon: Runell Gess, MD;  Location: [facility] INVASIVE CV LAB;  Service: Cardiovascular;  Laterality: N/A;  . CARDIOVERSION N/A 04/12/2015   Procedure: CARDIOVERSION;  Surgeon: Laurey Morale, MD;  Location: [facility] ENDOSCOPY;  Service: Cardiovascular;  Laterality: N/A;  . COLONOSCOPY  07/01/2003   XLK:[id number] colonic mucosa except  for the proximal right colon in the area of ICV which was not seen completely due to inadequate bowel prep. followed with ACBE which was normal.   . COLONOSCOPY N/A 08/24/2015   Dr. Jena Gauss: Normal colon. Next colonoscopy in 10 years.  . CORONARY BALLOON ANGIOPLASTY N/A 05/12/2018   Procedure:  CORONARY BALLOON ANGIOPLASTY;  Surgeon: Lyn Records, MD;  Location: [facility] INVASIVE CV LAB;  Service: Cardiovascular;  Laterality: N/A;  . ELECTROPHYSIOLOGIC STUDY N/A 05/30/2015   Atrial fibrillation ablation by Dr Johney Frame  . ESOPHAGOGASTRODUODENOSCOPY N/A 08/24/2015   Dr. Jena Gauss: Medium-sized hiatal hernia, erosive gastropathy. Cameron lesions. Esophageal mucosa distally suggestive of short segment Barrett's esophagus. Not confirmed on biopsy. Gastric biopsy with minimal chronic inflammation  . GIVENS CAPSULE STUDY N/A 04/17/2016   Procedure: GIVENS CAPSULE STUDY;  Surgeon: Corbin Ade, MD;  Location: [facility] ENDO SUITE;  Service: Endoscopy;  Laterality: N/A;  Pt to arrive at 8:00 am for 8:30 am appt  . LEFT HEART CATH AND CORONARY ANGIOGRAPHY N/A 05/11/2018   Procedure: LEFT HEART CATH AND CORONARY ANGIOGRAPHY;  Surgeon: Lyn Records, MD;  Location: [facility] INVASIVE CV LAB;  Service: Cardiovascular;  Laterality: N/A;  . TEE WITHOUT CARDIOVERSION N/A 04/12/2015   Procedure: TRANSESOPHAGEAL ECHOCARDIOGRAM (TEE);  Surgeon: Laurey Morale, MD;  Location: [facility] ENDOSCOPY;  Service: Cardiovascular;  Laterality: N/A;    Social History:  reports that he quit smoking about 20 years ago. His smoking use included cigarettes. He started smoking about 50 years ago. He has a 30 pack-year smoking history. He has been exposed to tobacco smoke. He has never used smokeless tobacco. He reports that he does not drink alcohol and does not use drugs.   No Known Allergies  Family History  Problem Relation Age of Onset  . Hypertension Mother   . Colon cancer Neg Hx     ***  Prior to Admission medications   Medication Sig Start Date End Date Taking? Authorizing Provider  Abatacept (ORENCIA CLICKJECT) 125 MG/ML SOAJ Inject 125 mg into the skin once a week. Patient not taking: Reported on 04/08/2023 12/31/22   Gearldine Bienenstock, PA-C  albuterol (VENTOLIN HFA) 108 (90 Base) MCG/ACT inhaler Inhale 2 puffs into the lungs every 6 (six)  hours as needed for wheezing or shortness of breath. 03/12/23   Oretha Milch, MD  amLODipine (NORVASC) 5 MG tablet Take 1 tablet (5 mg total) by mouth daily. 04/24/23   Runell Gess, MD  apixaban (ELIQUIS) 5 MG TABS tablet TAKE 1 TABLET TWICE DAILY 05/15/23   Runell Gess, MD  atorvastatin (LIPITOR) 80 MG tablet TAKE 1 TABLET EVERY DAY AT 6 PM (APPOINTMENT IS NEEDED) 05/21/22   Runell Gess, MD  azaTHIOprine (IMURAN) 50 MG tablet TAKE 3 TABLETS EVERY DAY 03/31/23   Gearldine Bienenstock, PA-C  clobetasol (TEMOVATE) 0.05 % external solution Apply 1 Application topically 2 (two) times daily. 01/24/23   Gearldine Bienenstock, PA-C  Clobetasol Prop Emollient Base (CLOBETASOL PROPIONATE E) 0.05 % emollient cream APPLY TO AFFECTED AREA TWICE A DAY AS NEEDED 04/04/23   Gearldine Bienenstock, PA-C  clopidogrel (PLAVIX) 75 MG tablet Take 1 tablet (75 mg total) by mouth daily. 04/24/23   Runell Gess, MD  furosemide (LASIX) 40 MG tablet TAKE 1 TABLET TWICE DAILY 06/17/22   Runell Gess, MD  levofloxacin (LEVAQUIN) 750 MG tablet Take 1 tablet (750 mg total) by mouth daily. X 7 days 04/19/23   Bethann Berkshire, MD  losartan (  COZAAR) 100 MG tablet TAKE 1 TABLET EVERY DAY 05/21/22   Runell Gess, MD  pantoprazole (PROTONIX) 40 MG tablet TAKE 1 TABLET TWICE DAILY 11/02/20   Gelene Mink, NP  potassium chloride SA (KLOR-CON M) 20 MEQ tablet TAKE 2 TABLETS TWICE DAILY 10/31/22   Runell Gess, MD  predniSONE (DELTASONE) 5 MG tablet TAKE 1 TABLET DAILY WITH BREAKFAST. 04/16/23   Gearldine Bienenstock, PA-C  sotalol (BETAPACE) 120 MG tablet TAKE 1 TABLET EVERY 12 HOURS 10/31/22   Runell Gess, MD    Physical Exam: BP (!) 167/63   Pulse 66   Temp 98.9 F (37.2 C) (Oral)   Resp 20   SpO2 95%   General: 67 y.o. year-old male well developed well nourished in no acute distress.  Alert and oriented x3. HEENT: NCAT, EOMI Neck: Supple, trachea medial Cardiovascular: Regular rate and rhythm with no rubs or gallops.  No  thyromegaly or JVD noted.  No lower extremity edema. 2/4 pulses in all 4 extremities. Respiratory: Tachypnea.  Diffuse faint expiratory wheezes, bilateral Rales in lower lobes (L > R ).  Patient on supplemental oxygen via [place] at 2 LPM Abdomen: Soft, nontender nondistended with normal bowel sounds x4 quadrants. Muskuloskeletal: No cyanosis, clubbing or edema noted bilaterally Neuro: CN II-XII intact, strength 5/5 x 4, sensation, reflexes intact Skin: No ulcerative lesions noted or rashes Psychiatry: Judgement and insight appear normal. Mood is appropriate for condition and setting          Labs on Admission:  Basic Metabolic Panel: Recent Labs  Lab 06/06/23 1540  NA 128*  K 5.1  CL 88*  CO2 29  GLUCOSE 88  BUN 12  CREATININE 0.54*  CALCIUM 9.2   Liver Function Tests: Recent Labs  Lab 06/06/23 1540  AST 66*  ALT 70*  ALKPHOS 152*  BILITOT 0.9  PROT 7.1  ALBUMIN 2.8*   No results for input(s): "LIPASE", "AMYLASE" in the last 168 hours. No results for input(s): "AMMONIA" in the last 168 hours. CBC: Recent Labs  Lab 06/06/23 1540  WBC 10.7*  NEUTROABS 7.6  HGB 12.6*  HCT 39.4  MCV 86.6  PLT 272   Cardiac Enzymes: No results for input(s): "CKTOTAL", "CKMB", "CKMBINDEX", "TROPONINI" in the last 168 hours.  BNP (last 3 results) Recent Labs    04/19/23 1130 06/06/23 1540  BNP 386.0* 336.0*    ProBNP (last 3 results) No results for input(s): "PROBNP" in the last 8760 hours.  CBG: No results for input(s): "GLUCAP" in the last 168 hours.  Radiological Exams on Admission: CT Chest W Contrast Result Date: 06/06/2023 CLINICAL DATA:  Follow-up left-sided pleural effusion EXAM: CT CHEST WITH CONTRAST TECHNIQUE: Multidetector CT imaging of the chest was performed during intravenous contrast administration. RADIATION DOSE REDUCTION: This exam was performed according to the departmental dose-optimization program which includes automated exposure control, adjustment of the  mA and/or kV according to patient size and/or use of iterative reconstruction technique. CONTRAST:  75mL OMNIPAQUE IOHEXOL 300 MG/ML  SOLN COMPARISON:  05/26/2023, 04/19/2023 FINDINGS: Cardiovascular: Atherosclerotic calcifications of the thoracic aorta are noted without aneurysmal dilatation or dissection. Heavy coronary calcifications are seen. The heart is at the upper limits of normal in size. No sizable central pulmonary emboli are noted. Mediastinum/Nodes: Thoracic inlet is within normal limits. Scattered likely reactive lymph nodes are noted in the mediastinum similar to that seen on the prior exam. The largest of these is in the subcarinal region measuring 12 mm slightly decreased  when compared with the prior exam. The hilar nodes are also slightly decreased in size when compared with the prior study. The esophagus is air-filled and within normal limits. A small sliding-type hiatal hernia is noted. Lungs/Pleura: Bilateral pleural effusions are again identified left worse than right with fluid trapped in the major fissure on the left. The right-sided effusion is stable when compared with 05/26/2023 but increased when compared with the prior exam from February of 2025. Persistent diffuse fibrotic changes are identified in both lungs with some area of adjacent consolidation particularly in the left lower lobe also stable from the prior study. No new parenchymal nodules or areas of consolidation are noted. Upper Abdomen: Visualized upper abdomen shows no acute abnormality. Some scarring in the right kidney is noted. Musculoskeletal: Multiple sclerotic lesions are again identified throughout the bony structures to include the ribcage, thoracic spine and proximal humeri bilaterally. Scapular and sternal lesions are noted as well. These are again highly suggestive of metastatic prostate disease. Correlation with PSA is recommended. IMPRESSION: Overall stable appearance of the pleural effusions bilaterally left  greater than right. Persistent fibrotic change and left lower lobe consolidation is seen and stable. No new areas of consolidation are noted. Hiatal hernia. Extensive sclerotic osseous lesions highly suggestive of metastatic prostate carcinoma. Correlate with serum PSA. These are relatively stable when compared with the prior exam. Hilar and mediastinal lymph nodes likely reactive in nature and slightly decreased when compared with the prior exam. Aortic Atherosclerosis (ICD10-I70.0). Electronically Signed   By: Alcide Clever M.D.   On: 06/06/2023 19:32   DG Chest 2 View Result Date: 06/06/2023 CLINICAL DATA:  Shortness of breath. EXAM: CHEST - 2 VIEW COMPARISON:  04/19/2023. FINDINGS: Re-demonstration of left retrocardiac airspace opacity obscuring the left hemidiaphragm, descending thoracic aorta and blunting the left lateral costophrenic angle, suggesting combination of left lung atelectasis and/or consolidation with pleural effusion. No significant interval change. There also diffuse reticulonodular opacities throughout bilateral lungs, similar to prior study and corresponds to underlying chronic interstitial lung disease. No acute consolidation or lung collapse. There is trace right pleural effusion as well, unchanged. Stable cardio-mediastinal silhouette. No acute osseous abnormalities. The soft tissues are within normal limits. IMPRESSION: *No significant interval change since the prior study. Redemonstration of left retrocardiac airspace opacity, likely combination of left lung atelectasis and/or consolidation with pleural effusion. Electronically Signed   By: Jules Schick M.D.   On: 06/06/2023 15:22    EKG: I independently viewed the EKG done and my findings are as followed: Normal sinus rhythm at a rate of 64 bpm with LBBB  Assessment/Plan Present on Admission: . HCAP (healthcare-associated pneumonia)  Principal Problem:   HCAP (healthcare-associated pneumonia)  HCAP Bilateral pleural  effusion (L > R) Acute respiratory failure with hypoxia in the setting of above Hyponatremia Transaminitis    DVT prophylaxis: ***   Code Status: ***   Family Communication: ***   Disposition Plan: ***   Consults called: ***   Admission status: ***     Frankey Shown MD [facility] Pager [phone]  If 7PM-7AM, please contact night-coverage [email or web address] Password [facility]  06/06/2023, 11:35 PM         Review of Systems: {ROS_Text:26778} Past Medical History:  Diagnosis Date  . Abnormal CT scan, stomach   . Chronic systolic dysfunction of left ventricle   . Coronary artery disease    lateral STEMI 02/22/15 3/10 cutting balloon to ISR of the o/pLCX  . GERD (gastroesophageal reflux disease)   . Hyperlipidemia   .  Hypertension   . Ischemic cardiomyopathy   . LBBB (left bundle branch block)   . Leukocytosis    followed by hematology, reactive  . Lymphadenopathy   . Myocardial infarction (HCC) 02/2015  . Psoriasis 2003  . Psoriatic arthritis (HCC)   . Pulmonary fibrosis (HCC)   . Rheumatoid arthritis(714.0) 2012  . Typical atrial flutter [facility])    Past Surgical History:  Procedure Laterality Date  . CARDIAC CATHETERIZATION N/A 02/22/2015   Procedure: Left Heart Cath and Coronary Angiography;  Surgeon: Runell Gess, MD;  Location[facility] INVASIVE CV LAB;  Service: Cardiovascular;  Laterality: N/A;  . CARDIAC CATHETERIZATION N/A 02/22/2015   Procedure: Coronary Stent Intervention;  Surgeon: Runell Gess, MD;  Location: [facility] INVASIVE CV LAB;  Service: Cardiovascular;  Laterality: N/A;  . CARDIOVERSION N/A 04/12/2015   Procedure: CARDIOVERSION;  Surgeon: Laurey Morale, MD;  Location: [facility] ENDOSCOPY;  Service: Cardiovascular;  Laterality: N/A;  . COLONOSCOPY  07/01/2003   WUJ:[id number] colonic mucosa except for the proximal right colon in the area of ICV which was not seen completely due to inadequate bowel prep. followed with ACBE which was normal.   .  COLONOSCOPY N/A 08/24/2015   Dr. Jena Gauss: Normal colon. Next colonoscopy in 10 years.  . CORONARY BALLOON ANGIOPLASTY N/A 05/12/2018   Procedure: CORONARY BALLOON ANGIOPLASTY;  Surgeon: Lyn Records, MD;  Location: [facility] INVASIVE CV LAB;  Service: Cardiovascular;  Laterality: N/A;  . ELECTROPHYSIOLOGIC STUDY N/A 05/30/2015   Atrial fibrillation ablation by Dr Johney Frame  . ESOPHAGOGASTRODUODENOSCOPY N/A 08/24/2015   Dr. Jena Gauss: Medium-sized hiatal hernia, erosive gastropathy. Cameron lesions. Esophageal mucosa distally suggestive of short segment Barrett's esophagus. Not confirmed on biopsy. Gastric biopsy with minimal chronic inflammation  . GIVENS CAPSULE STUDY N/A 04/17/2016   Procedure: GIVENS CAPSULE STUDY;  Surgeon: Corbin Ade, MD;  Location: [facility] ENDO SUITE;  Service: Endoscopy;  Laterality: N/A;  Pt to arrive at 8:00 am for 8:30 am appt  . LEFT HEART CATH AND CORONARY ANGIOGRAPHY N/A 05/11/2018   Procedure: LEFT HEART CATH AND CORONARY ANGIOGRAPHY;  Surgeon: Lyn Records, MD;  Location: [facility] INVASIVE CV LAB;  Service: Cardiovascular;  Laterality: N/A;  . TEE WITHOUT CARDIOVERSION N/A 04/12/2015   Procedure: TRANSESOPHAGEAL ECHOCARDIOGRAM (TEE);  Surgeon: Laurey Morale, MD;  Location: [facility] ENDOSCOPY;  Service: Cardiovascular;  Laterality: N/A;   Social History:  reports that he quit smoking about 20 years ago. His smoking use included cigarettes. He started smoking about 50 years ago. He has a 30 pack-year smoking history. He has been exposed to tobacco smoke. He has never used smokeless tobacco. He reports that he does not drink alcohol and does not use drugs.  No Known Allergies  Family History  Problem Relation Age of Onset  . Hypertension Mother   . Colon cancer Neg Hx     Prior to Admission medications   Medication Sig Start Date End Date Taking? Authorizing Provider  Abatacept (ORENCIA CLICKJECT) 125 MG/ML SOAJ Inject 125 mg into the skin once a week. Patient not taking: Reported on  04/08/2023 12/31/22   Gearldine Bienenstock, PA-C  albuterol (VENTOLIN HFA) 108 (90 Base) MCG/ACT inhaler Inhale 2 puffs into the lungs every 6 (six) hours as needed for wheezing or shortness of breath. 03/12/23   Oretha Milch, MD  amLODipine (NORVASC) 5 MG tablet Take 1 tablet (5 mg total) by mouth daily. 04/24/23   Runell Gess, MD  apixaban (ELIQUIS) 5 MG TABS tablet TAKE 1 TABLET  TWICE DAILY 05/15/23   Runell Gess, MD  atorvastatin (LIPITOR) 80 MG tablet TAKE 1 TABLET EVERY DAY AT 6 PM (APPOINTMENT IS NEEDED) 05/21/22   Runell Gess, MD  azaTHIOprine [facility]) 50 MG tablet TAKE 3 TABLETS EVERY DAY 03/31/23   Gearldine Bienenstock, PA-C  clobetasol (TEMOVATE) 0.05 % external solution Apply 1 Application topically 2 (two) times daily. 01/24/23   Gearldine Bienenstock, PA-C  Clobetasol Prop Emollient Base (CLOBETASOL PROPIONATE E) 0.05 % emollient cream APPLY TO AFFECTED AREA TWICE A DAY AS NEEDED 04/04/23   Gearldine Bienenstock, PA-C  clopidogrel (PLAVIX) 75 MG tablet Take 1 tablet (75 mg total) by mouth daily. 04/24/23   Runell Gess, MD  furosemide (LASIX) 40 MG tablet TAKE 1 TABLET TWICE DAILY 06/17/22   Runell Gess, MD  levofloxacin (LEVAQUIN) 750 MG tablet Take 1 tablet (750 mg total) by mouth daily. X 7 days 04/19/23   Bethann Berkshire, MD  losartan (COZAAR) 100 MG tablet TAKE 1 TABLET EVERY DAY 05/21/22   Runell Gess, MD  pantoprazole (PROTONIX) 40 MG tablet TAKE 1 TABLET TWICE DAILY 11/02/20   Gelene Mink, NP  potassium chloride SA (KLOR-CON M) 20 MEQ tablet TAKE 2 TABLETS TWICE DAILY 10/31/22   Runell Gess, MD  predniSONE (DELTASONE) 5 MG tablet TAKE 1 TABLET DAILY WITH BREAKFAST. 04/16/23   Gearldine Bienenstock, PA-C  sotalol (BETAPACE) 120 MG tablet TAKE 1 TABLET EVERY 12 HOURS 10/31/22   Runell Gess, MD    Physical Exam: Vitals:   06/06/23 1515 06/06/23 1530 06/06/23 1630 06/06/23 1915  BP: (!) 154/59 (!) 153/55 134/62 (!) 149/60  Pulse: 63 62 64 66  Resp:   (!) 26 (!) 23  Temp:       TempSrc:      SpO2: 94% 95% 93% 94%   *** Data Reviewed: {Tip this will not be part of the note when signed- Document your independent interpretation of telemetry tracing, EKG, lab, Radiology test or any other diagnostic tests. Add any new diagnostic test ordered today. (Optional):26781} {Results:26384}  Assessment and Plan: No notes have been filed under this hospital service. Service: Hospitalist     Advance Care Planning:   Code Status: Prior ***  Consults: ***  Family Communication: ***  Severity of Illness: {Observation/Inpatient:21159}  Author: Frankey Shown, DO 06/06/2023 8:41 PM  For on call review [email or web address].

## 2023-06-07 ENCOUNTER — Inpatient Hospital Stay (HOSPITAL_COMMUNITY)

## 2023-06-07 DIAGNOSIS — E871 Hypo-osmolality and hyponatremia: Secondary | ICD-10-CM | POA: Insufficient documentation

## 2023-06-07 DIAGNOSIS — J189 Pneumonia, unspecified organism: Secondary | ICD-10-CM | POA: Diagnosis not present

## 2023-06-07 DIAGNOSIS — C61 Malignant neoplasm of prostate: Secondary | ICD-10-CM | POA: Insufficient documentation

## 2023-06-07 DIAGNOSIS — I4892 Unspecified atrial flutter: Secondary | ICD-10-CM

## 2023-06-07 DIAGNOSIS — R7401 Elevation of levels of liver transaminase levels: Secondary | ICD-10-CM | POA: Insufficient documentation

## 2023-06-07 DIAGNOSIS — C7951 Secondary malignant neoplasm of bone: Secondary | ICD-10-CM | POA: Insufficient documentation

## 2023-06-07 DIAGNOSIS — R7989 Other specified abnormal findings of blood chemistry: Secondary | ICD-10-CM | POA: Insufficient documentation

## 2023-06-07 DIAGNOSIS — I10 Essential (primary) hypertension: Secondary | ICD-10-CM | POA: Diagnosis not present

## 2023-06-07 DIAGNOSIS — J90 Pleural effusion, not elsewhere classified: Secondary | ICD-10-CM | POA: Insufficient documentation

## 2023-06-07 DIAGNOSIS — J9601 Acute respiratory failure with hypoxia: Secondary | ICD-10-CM | POA: Diagnosis not present

## 2023-06-07 LAB — PROTEIN, PLEURAL OR PERITONEAL FLUID: Total protein, fluid: 4.4 g/dL

## 2023-06-07 LAB — COMPREHENSIVE METABOLIC PANEL WITH GFR
ALT: 68 U/L — ABNORMAL HIGH (ref 0–44)
AST: 61 U/L — ABNORMAL HIGH (ref 15–41)
Albumin: 2.6 g/dL — ABNORMAL LOW (ref 3.5–5.0)
Alkaline Phosphatase: 165 U/L — ABNORMAL HIGH (ref 38–126)
Anion gap: 12 (ref 5–15)
BUN: 9 mg/dL (ref 8–23)
CO2: 26 mmol/L (ref 22–32)
Calcium: 9.4 mg/dL (ref 8.9–10.3)
Chloride: 95 mmol/L — ABNORMAL LOW (ref 98–111)
Creatinine, Ser: 0.54 mg/dL — ABNORMAL LOW (ref 0.61–1.24)
GFR, Estimated: >60 mL/min (ref >60)
Glucose, Bld: 90 mg/dL (ref 70–99)
Potassium: 4.6 mmol/L (ref 3.5–5.1)
Sodium: 133 mmol/L — ABNORMAL LOW (ref 135–145)
Total Bilirubin: 1.1 mg/dL (ref 0.0–1.2)
Total Protein: 7.4 g/dL (ref 6.5–8.1)

## 2023-06-07 LAB — CBC
HCT: 40.9 % (ref 39.0–52.0)
Hemoglobin: 13.3 g/dL (ref 13.0–17.0)
MCH: 28.2 pg (ref 26.0–34.0)
MCHC: 32.5 g/dL (ref 30.0–36.0)
MCV: 86.8 fL (ref 80.0–100.0)
Platelets: 305 10*3/uL (ref 150–400)
RBC: 4.71 MIL/uL (ref 4.22–5.81)
RDW: 17.7 % — ABNORMAL HIGH (ref 11.5–15.5)
WBC: 12.3 10*3/uL — ABNORMAL HIGH (ref 4.0–10.5)
nRBC: 0 % (ref 0.0–0.2)

## 2023-06-07 LAB — OSMOLALITY: Osmolality: 278 mosm/kg (ref 275–295)

## 2023-06-07 LAB — GLUCOSE, PLEURAL OR PERITONEAL FLUID: Glucose, Fluid: 82 mg/dL

## 2023-06-07 LAB — MAGNESIUM: Magnesium: 2.1 mg/dL (ref 1.7–2.4)

## 2023-06-07 LAB — ALBUMIN, PLEURAL OR PERITONEAL FLUID: Albumin, Fluid: 2.1 g/dL

## 2023-06-07 LAB — LACTATE DEHYDROGENASE, PLEURAL OR PERITONEAL FLUID: LD, Fluid: 319 U/L — ABNORMAL HIGH (ref 3–23)

## 2023-06-07 LAB — PHOSPHORUS: Phosphorus: 4.6 mg/dL (ref 2.5–4.6)

## 2023-06-07 MED ORDER — SODIUM CHLORIDE 0.9 % IV SOLN
2.0000 g | Freq: Three times a day (TID) | INTRAVENOUS | Status: DC
  Administered 2023-06-07 – 2023-06-13 (×20): 2 g via INTRAVENOUS
  Filled 2023-06-07 (×20): qty 12.5

## 2023-06-07 MED ORDER — AMLODIPINE BESYLATE 5 MG PO TABS
5.0000 mg | ORAL_TABLET | Freq: Every day | ORAL | Status: DC
  Administered 2023-06-07 – 2023-06-13 (×7): 5 mg via ORAL
  Filled 2023-06-07 (×7): qty 1

## 2023-06-07 MED ORDER — ACETYLCYSTEINE 20 % IN SOLN: 4.0000 mL | Freq: Three times a day (TID) | RESPIRATORY_TRACT | Status: DC

## 2023-06-07 MED ORDER — DM-GUAIFENESIN ER 30-600 MG PO TB12
1.0000 | ORAL_TABLET | Freq: Two times a day (BID) | ORAL | Status: DC
  Administered 2023-06-07 – 2023-06-13 (×14): 1 via ORAL
  Filled 2023-06-07 (×14): qty 1

## 2023-06-07 MED ORDER — PANTOPRAZOLE SODIUM 40 MG PO TBEC
40.0000 mg | DELAYED_RELEASE_TABLET | Freq: Two times a day (BID) | ORAL | Status: DC
  Administered 2023-06-07 – 2023-06-13 (×13): 40 mg via ORAL
  Filled 2023-06-07 (×13): qty 1

## 2023-06-07 MED ORDER — SODIUM CHLORIDE 0.9 % IV SOLN
500.0000 mg | INTRAVENOUS | Status: AC
  Administered 2023-06-07 – 2023-06-09 (×3): 500 mg via INTRAVENOUS
  Filled 2023-06-07 (×3): qty 5

## 2023-06-07 MED ORDER — ONDANSETRON HCL 4 MG/2ML IJ SOLN: 4.0000 mg | Freq: Four times a day (QID) | INTRAMUSCULAR | Status: DC | PRN

## 2023-06-07 MED ORDER — ENSURE ENLIVE PO LIQD
237.0000 mL | Freq: Two times a day (BID) | ORAL | Status: DC
  Administered 2023-06-08 – 2023-06-13 (×10): 237 mL via ORAL

## 2023-06-07 MED ORDER — LOSARTAN POTASSIUM 50 MG PO TABS
100.0000 mg | ORAL_TABLET | Freq: Every day | ORAL | Status: DC
  Administered 2023-06-07 – 2023-06-13 (×7): 100 mg via ORAL
  Filled 2023-06-07 (×7): qty 2

## 2023-06-07 MED ORDER — SODIUM CHLORIDE 3 % IN NEBU
4.0000 mL | INHALATION_SOLUTION | Freq: Two times a day (BID) | RESPIRATORY_TRACT | Status: AC
  Administered 2023-06-07 – 2023-06-09 (×5): 4 mL via RESPIRATORY_TRACT
  Filled 2023-06-07 (×6): qty 4

## 2023-06-07 MED ORDER — FUROSEMIDE 10 MG/ML IJ SOLN
40.0000 mg | Freq: Once | INTRAMUSCULAR | Status: AC
  Administered 2023-06-07: 40 mg via INTRAVENOUS
  Filled 2023-06-07: qty 4

## 2023-06-07 MED ORDER — METHYLPREDNISOLONE SODIUM SUCC 40 MG IJ SOLR
40.0000 mg | Freq: Every day | INTRAMUSCULAR | Status: DC
  Administered 2023-06-08 – 2023-06-11 (×4): 40 mg via INTRAVENOUS
  Filled 2023-06-07 (×4): qty 1

## 2023-06-07 MED ORDER — METHYLPREDNISOLONE SODIUM SUCC 125 MG IJ SOLR
120.0000 mg | Freq: Every day | INTRAMUSCULAR | Status: DC
  Administered 2023-06-07: 120 mg via INTRAVENOUS
  Filled 2023-06-07: qty 2

## 2023-06-07 MED ORDER — LIDOCAINE HCL (PF) 1 % IJ SOLN: 1.0000 mL | Freq: Once | INTRAMUSCULAR | Status: DC

## 2023-06-07 MED ORDER — ACETAMINOPHEN 650 MG RE SUPP: 650.0000 mg | Freq: Four times a day (QID) | RECTAL | Status: DC | PRN

## 2023-06-07 MED ORDER — IPRATROPIUM-ALBUTEROL 0.5-2.5 (3) MG/3ML IN SOLN
3.0000 mL | Freq: Four times a day (QID) | RESPIRATORY_TRACT | Status: DC
  Administered 2023-06-07 – 2023-06-09 (×8): 3 mL via RESPIRATORY_TRACT
  Filled 2023-06-07 (×8): qty 3

## 2023-06-07 MED ORDER — ACETAMINOPHEN 325 MG PO TABS: 650.0000 mg | ORAL_TABLET | Freq: Four times a day (QID) | ORAL | Status: DC | PRN

## 2023-06-07 MED ORDER — ONDANSETRON HCL 4 MG PO TABS: 4.0000 mg | ORAL_TABLET | Freq: Four times a day (QID) | ORAL | Status: DC | PRN

## 2023-06-07 NOTE — Plan of Care (Signed)
  Problem: Activity: Goal: Ability to tolerate increased activity will improve Outcome: Progressing   Problem: Clinical Measurements: Goal: Ability to maintain a body temperature in the normal range will improve Outcome: Progressing   Problem: Respiratory: Goal: Ability to maintain adequate ventilation will improve Outcome: Progressing Goal: Ability to maintain a clear airway will improve Outcome: Progressing   

## 2023-06-07 NOTE — Hospital Course (Signed)
 Curtis Flores is a 68 y.o. male with medical history significant of hypertension, hyperlipidemia, pulmonary fibrosis, rheumatoid arthritis, atrial flutter, chronic HFrEF presented to the emergency department complaints of shortness of breath.  Of note patient was recently diagnosed with left lower lobe pneumonia on 04/19/2023 and had completed Levaquin course at that time.  He had been having persistent symptoms of shortness of breath subjective fever productive cough since then but then the symptoms got worse so he decided to come to the hospital.  In the ED patient was noted to be tachypneic with mild bradycardia.  Pulse ox was 90% on room air.  WBC was 10.7.  Sodium was low at 128 with albumin low at 2.8.  AST 66, ALT 70, ALP 152, BNP 336 (this was 386 on 04/19/2023).  Influenza A, B, SARS, RSV was negative.  CT chest with contrast overall  stable appearance of the pleural effusions bilaterally left greater than right. Persistent fibrotic change and left lower lobe consolidation is seen and stable.  Chest x-ray showed redemonstration of left retrocardiac airspace opacity, likely combination of left lung atelectasis and/or consolidation with pleural effusion. Pulmonologist (Dr. Vassie Loll) was consulted and recommended broad-spectrum antibiotic, Solu-Medrol in pleural tapping.  He was treated with IV vancomycin and cefepime, IV Solu-Medrol was started.  Patient was then admitted hospital for further evaluation and treatment.  HCAP in the setting of pulmonary fibrosis Continue vancomycin and cefepime de-escalate depending upon  blood culture, sputum culture, urine Legionella, strep pneumo.  Continue Solu-Medrol Mucinex incentive spirometry flutter valve   Bilateral pleural effusion (L > R) Plan for thoracocentesis.  Eliquis on hold   Acute respiratory failure with hypoxia  related to pulmonary fibrosis and pneumonia.  Continue supplemental oxygen to maintain O2 sats > 92% plans to wean off as able.    Hyponatremia Na 128 on presentation.  Has improved to 133.  Will continue to monitor.   Elevated LFTs. AST 66, ALT 70, ALP 152.  No abdominal pain.  Check right upper quadrant ultrasound.  Continue to monitor.    Hypoalbuminemia possible secondary to moderate protein calorie malnutrition Albumin 2.8, protein supplement will be provided   Metastatic prostate cancer CT chest with contrast was suggestive of extensive sclerotic osseous lesions highly suggestive of metastatic prostate carcinoma.  Check PSA. Consider urology consultation   Essential hypertension Continue amlodipine, losartan   Mixed hyperlipidemia Hold statins due to elevated LFT.   GERD Continue Protonix

## 2023-06-07 NOTE — Procedures (Signed)
 Ultrasound-guided diagnostic and therapeutic left sided thoracentesis performed yielding 250 milliters of amber colored fluid. No immediate complications.   Diagnostic fluid was sent to the lab for further analysis. Follow-up chest x-ray pending. EBL is < 2 ml.    As not fluid was present during evaluation consideration should be given to possible mucus plug.

## 2023-06-07 NOTE — Consult Note (Addendum)
 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 Yesterday, he presented to the ED with worsening SOB, DOE, chills, mild wheezing, and coughing tan sputum. Denied fever, rigors, CP, URTI symptoms, aspirations, GERD, rash, N/V/D, and abd pain. Has has chronic trace feet edema. Smoked tobacco but quit 20 yrs ago. In the ED, SpO2 90% on RA. Started on Vanco, Cefepime, and solumedrol. He had left thoracentesis today 250 cc amber colored fluid sent to w/u.   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 rate 18, SpO2 98%.        Intake/Output Summary (Last 24 hours) at 06/07/2023 1157 Last data filed at 06/07/2023 0500 Gross per 24 hour  Intake 0 ml  Output 500 ml  Net -500 ml   There were no vitals filed for this visit.  Examination: General: alert, oriented x4, and comfortable.   HENT: PERL, normal pharynx and oral mucosa. No LNE or thyromegaly. No JVD Lungs: symmetrical air entry bilaterally. Coarse crackles and mild scattered wheezing Cardiovascular: NL S1/S2. No m/g/r Abdomen: no distension or tenderness Extremities: trace  edema. Symmetrical  Neuro: nonfocal    Resolved Hospital Problem list     Assessment & Plan:  Non resolving LLL CAP and acute hypoxia  -D/c Vanco since MRSA screening is negative -Continue Cefepime -Add Zithromax -Urine Ags -Sputum Cx -f/u Bcx2 -f/u pleural effusion w/u -I/S -HTS neb -Duoneb  -May need bronchoscopy for BAL if he is not improving -Steroids -ISS  06/04/2023, PFT: severe restriction, corrected Dlco is NL , no obstruction.    Rh.A related ILD, on Albuterol PRN (no home O2) -as above  Bilateral pleural effusions (left >right), s/p left thoracentesis, sent to w/u -f/u pleural effusion w/u   HTN, atrial flutter, chronic S-CHF (Nov 2024, Echo: EF 35% and G1DD) -One dose of IV Lasix -Repeat Echo   Labs   CBC: Recent Labs  Lab 06/06/23 1540 06/07/23 0250  WBC 10.7* 12.3*  NEUTROABS 7.6  --   HGB 12.6* 13.3  HCT 39.4 40.9  MCV 86.6 86.8  PLT 272 305    Basic Metabolic Panel: Recent Labs  Lab 06/06/23 1540 06/07/23 0250  NA 128* 133*  K 5.1 4.6  CL 88* 95*  CO2 29 26  GLUCOSE 88 90  BUN 12 9  CREATININE 0.54* 0.54*  CALCIUM 9.2 9.4  MG  --  2.1  PHOS  --  4.6   GFR: Estimated Creatinine Clearance: 79 mL/min (A) (by C-G formula based on SCr of 0.54 mg/dL (L)). Recent Labs  Lab 06/06/23 1540 06/07/23 0250  WBC  10.7* 12.3*    Liver Function Tests: Recent Labs  Lab 06/06/23 1540 06/07/23 0250  AST 66* 61*  ALT 70* 68*  ALKPHOS 152* 165*  BILITOT 0.9 1.1  PROT 7.1 7.4  ALBUMIN 2.8* 2.6*   No results for input(s): "LIPASE", "AMYLASE" in the last 168 hours. No results for input(s): "AMMONIA" in the last 168 hours.  ABG    Component Value Date/Time   TCO2 25 04/07/2015 1814     Coagulation Profile: No results for input(s): "INR", "PROTIME" in the last 168 hours.  Cardiac Enzymes: No results for input(s): "CKTOTAL", "CKMB", "CKMBINDEX", "TROPONINI" in the last 168 hours.  HbA1C: Hgb A1c MFr Bld  Date/Time Value Ref Range  Status  05/09/2018 08:46 PM 5.6 4.8 - 5.6 % Final    Comment:    (NOTE) Pre diabetes:          5.7%-6.4% Diabetes:              >6.4% Glycemic control for   <7.0% adults with diabetes   01/31/2018 02:59 AM 5.4 4.8 - 5.6 % Final    Comment:    (NOTE) Pre diabetes:          5.7%-6.4% Diabetes:              >6.4% Glycemic control for   <7.0% adults with diabetes     CBG: No results for input(s): "GLUCAP" in the last 168 hours.  Review of Systems:   Review of Systems  Constitutional:  Positive for chills and malaise/fatigue. Negative for diaphoresis, fever and weight loss.  HENT:  Negative for congestion, sinus pain and sore throat.   Respiratory:  Positive for cough, sputum production, shortness of breath and wheezing. Negative for hemoptysis and stridor.   Cardiovascular:  Positive for leg swelling. Negative for chest pain, palpitations and orthopnea.  Gastrointestinal:  Negative for abdominal pain, diarrhea, nausea and vomiting.  Genitourinary:  Negative for dysuria.  Musculoskeletal:  Negative for myalgias.  Skin:  Negative for itching and rash.    Past Medical History:  He,  has a past medical history of Abnormal CT scan, stomach, Chronic systolic dysfunction of left ventricle, Coronary artery disease, GERD (gastroesophageal reflux disease), Hyperlipidemia, Hypertension, Ischemic cardiomyopathy, LBBB (left bundle branch block), Leukocytosis, Lymphadenopathy, Myocardial infarction (HCC) (02/2015), Psoriasis (2003), Psoriatic arthritis (HCC), Pulmonary fibrosis (HCC), Rheumatoid arthritis(714.0) (2012), and Typical atrial flutter (HCC).   Surgical History:   Past Surgical History:  Procedure Laterality Date   CARDIAC CATHETERIZATION N/A 02/22/2015   Procedure: Left Heart Cath and Coronary Angiography;  Surgeon: Runell Gess, MD;  Location: [facility] INVASIVE CV LAB;  Service: Cardiovascular;  Laterality: N/A;   CARDIAC CATHETERIZATION N/A 02/22/2015   Procedure: Coronary Stent  Intervention;  Surgeon: Runell Gess, MD;  Location: [facility] INVASIVE CV LAB;  Service: Cardiovascular;  Laterality: N/A;   CARDIOVERSION N/A 04/12/2015   Procedure: CARDIOVERSION;  Surgeon: Laurey Morale, MD;  Location: [facility] ENDOSCOPY;  Service: Cardiovascular;  Laterality: N/A;   COLONOSCOPY  07/01/2003   WUJ:[id number] colonic mucosa except for the proximal right colon in the area of ICV which was not seen completely due to inadequate bowel prep. followed with ACBE which was normal.    COLONOSCOPY N/A 08/24/2015   Dr. Jena Gauss: Normal colon. Next colonoscopy in 10 years.   CORONARY BALLOON ANGIOPLASTY N/A 05/12/2018   Procedure: CORONARY BALLOON ANGIOPLASTY;  Surgeon: Lyn Records, MD;  Location: [facility] INVASIVE CV LAB;  Service: Cardiovascular;  Laterality: N/A;   ELECTROPHYSIOLOGIC  STUDY N/A 05/30/2015   Atrial fibrillation ablation by Dr Johney Frame   ESOPHAGOGASTRODUODENOSCOPY N/A 08/24/2015   Dr. Jena Gauss: Medium-sized hiatal hernia, erosive gastropathy. Cameron lesions. Esophageal mucosa distally suggestive of short segment Barrett's esophagus. Not confirmed on biopsy. Gastric biopsy with minimal chronic inflammation   GIVENS CAPSULE STUDY N/A 04/17/2016   Procedure: GIVENS CAPSULE STUDY;  Surgeon: Corbin Ade, MD;  Location: [facility] ENDO SUITE;  Service: Endoscopy;  Laterality: N/A;  Pt to arrive at 8:00 am for 8:30 am appt   LEFT HEART CATH AND CORONARY ANGIOGRAPHY N/A 05/11/2018   Procedure: LEFT HEART CATH AND CORONARY ANGIOGRAPHY;  Surgeon: Lyn Records, MD;  Location[facility] INVASIVE CV LAB;  Service: Cardiovascular;  Laterality: N/A;   TEE WITHOUT CARDIOVERSION N/A 04/12/2015   Procedure: TRANSESOPHAGEAL ECHOCARDIOGRAM (TEE);  Surgeon: Laurey Morale, MD;  Location: [facility] ENDOSCOPY;  Service: Cardiovascular;  Laterality: N/A;     Social History:   reports that he quit smoking about 20 years ago. His smoking use included cigarettes. He started smoking about 50 years ago. He has a 30 pack-year smoking history. He  has been exposed to tobacco smoke. He has never used smokeless tobacco. He reports that he does not drink alcohol and does not use drugs.   Family History:  His family history includes Hypertension in his mother. There is no history of Colon cancer.   Allergies No Known Allergies   Home Medications  Prior to Admission medications   Medication Sig Start Date End Date Taking? Authorizing Provider  Abatacept (ORENCIA CLICKJECT) 125 MG/ML SOAJ Inject 125 mg into the skin once a week. Patient not taking: Reported on 04/08/2023 12/31/22   Gearldine Bienenstock, PA-C  albuterol (VENTOLIN HFA) 108 (90 Base) MCG/ACT inhaler Inhale 2 puffs into the lungs every 6 (six) hours as needed for wheezing or shortness of breath. 03/12/23   Oretha Milch, MD  amLODipine (NORVASC) 5 MG tablet Take 1 tablet (5 mg total) by mouth daily. 04/24/23   Runell Gess, MD  apixaban (ELIQUIS) 5 MG TABS tablet TAKE 1 TABLET TWICE DAILY 05/15/23   Runell Gess, MD  atorvastatin (LIPITOR) 80 MG tablet TAKE 1 TABLET EVERY DAY AT 6 PM (APPOINTMENT IS NEEDED) 05/21/22   Runell Gess, MD  azaTHIOprine (IMURAN) 50 MG tablet TAKE 3 TABLETS EVERY DAY 03/31/23   Gearldine Bienenstock, PA-C  clobetasol (TEMOVATE) 0.05 % external solution Apply 1 Application topically 2 (two) times daily. 01/24/23   Gearldine Bienenstock, PA-C  Clobetasol Prop Emollient Base (CLOBETASOL PROPIONATE E) 0.05 % emollient cream APPLY TO AFFECTED AREA TWICE A DAY AS NEEDED 04/04/23   Gearldine Bienenstock, PA-C  clopidogrel (PLAVIX) 75 MG tablet Take 1 tablet (75 mg total) by mouth daily. 04/24/23   Runell Gess, MD  furosemide (LASIX) 40 MG tablet TAKE 1 TABLET TWICE DAILY 06/17/22   Runell Gess, MD  levofloxacin (LEVAQUIN) 750 MG tablet Take 1 tablet (750 mg total) by mouth daily. X 7 days 04/19/23   Bethann Berkshire, MD  losartan (COZAAR) 100 MG tablet TAKE 1 TABLET EVERY DAY 05/21/22   Runell Gess, MD  pantoprazole (PROTONIX) 40 MG tablet TAKE 1 TABLET TWICE DAILY  11/02/20   Gelene Mink, NP  potassium chloride SA (KLOR-CON M) 20 MEQ tablet TAKE 2 TABLETS TWICE DAILY 10/31/22   Runell Gess, MD  predniSONE (DELTASONE) 5 MG tablet TAKE 1 TABLET DAILY WITH BREAKFAST. 04/16/23   Gearldine Bienenstock, PA-C  sotalol (BETAPACE) 120 MG tablet TAKE 1 TABLET EVERY 12 HOURS 10/31/22   Runell Gess, MD     Patrici Ranks, MD  [facility] Personal contact information can be found on Amion  If no contact or response made please call [phone]

## 2023-06-07 NOTE — Progress Notes (Addendum)
 PROGRESS NOTE  Curtis Flores AVW:[id number] DOB: 04-Sep-1955 DOA: 06/06/2023 PCP: Ladora Daniel, PA-C   LOS: 1 day   Brief narrative:   Curtis Flores is a 68 y.o. male with medical history significant of hypertension, hyperlipidemia, pulmonary fibrosis, rheumatoid arthritis, atrial flutter, chronic HFrEF presented to the emergency department complaints of shortness of breath.  Of note patient was recently diagnosed with left lower lobe pneumonia on 04/19/2023 and had completed Levaquin course at that time.  He had been having persistent symptoms of shortness of breath subjective fever productive cough since then but then the symptoms got worse so he decided to come to the hospital.  In the ED patient was noted to be tachypneic with mild bradycardia.  Pulse ox was 90% on room air.  WBC was 10.7.  Sodium was low at 128 with albumin low at 2.8.  AST 66, ALT 70, ALP 152, BNP 336 (this was 386 on 04/19/2023).  Influenza A, B, SARS, RSV was negative.  CT chest with contrast overall  stable appearance of the pleural effusions bilaterally left greater than right. Persistent fibrotic change and left lower lobe consolidation is seen and stable.  Chest x-ray showed redemonstration of left retrocardiac airspace opacity, likely combination of left lung atelectasis and/or consolidation with pleural effusion. Pulmonologist (Dr. Vassie Loll) was consulted and recommended broad-spectrum antibiotic, Solu-Medrol in pleural tapping.  He was treated with IV vancomycin and cefepime, IV Solu-Medrol was started.  Patient was then admitted hospital for further evaluation and treatment.   Assessment/Plan: Principal Problem:   HCAP (healthcare-associated pneumonia) Active Problems:   Pulmonary fibrosis (HCC)   Mixed hyperlipidemia   GERD (gastroesophageal reflux disease)   Essential hypertension   Bilateral pleural effusion   Hyponatremia   Transaminitis   Elevated brain natriuretic peptide (BNP) level   Prostate cancer metastatic  to bone (HCC)  HCAP in the setting of pulmonary fibrosis Continue vancomycin and cefepime de-escalate depending upon  blood culture, sputum culture, urine Legionella, strep pneumo.  Continue Solu-Medrol Mucinex incentive spirometry flutter valve.   Bilateral pleural effusion (L > R) Plan for thoracocentesis.  Likely today.  Eliquis on hold   Acute respiratory failure with hypoxia  related to pulmonary fibrosis and pneumonia.  Continue supplemental oxygen to maintain O2 sats > 92% plans to wean off as able.   Hyponatremia Na 128 on presentation.  Has improved to 133.  Will continue to monitor.   Elevated LFTs. AST 66, ALT 70, ALP 152.  No abdominal pain.  Check right upper quadrant ultrasound.  Continue to monitor.    Hypoalbuminemia possible secondary to moderate protein calorie malnutrition Albumin 2.8, protein supplement will be provided   Metastatic prostate cancer CT chest with contrast was suggestive of extensive sclerotic osseous lesions highly suggestive of metastatic prostate carcinoma.  Check PSA. Consider urology consultation   Essential hypertension Continue amlodipine, losartan   Mixed hyperlipidemia Hold statins due to elevated LFT.   GERD Continue Protonix  DVT prophylaxis: SCDs Start: 06/07/23 0031   Disposition: Likely home with home health in 1 to 2 days  Status is: Inpatient Remains inpatient appropriate because: Pending clinical improvement, thoracocentesis, IV antibiotics,    Code Status:     Code Status: Full Code  Family Communication: None at bedside.  I tried to reach out to the patient's spouse and son on the phone but was unable to reach them today.  Consultants: PCCM  Procedures: None yet  Anti-infectives:  Vancomycin and cefepime  Anti-infectives (From admission, onward)  Start     Dose/Rate Route Frequency Ordered Stop   06/07/23 1000  vancomycin (VANCOREADY) IVPB 750 mg/150 mL       Placed in "Followed by" Linked Group   750  mg 150 mL/hr over 60 Minutes Intravenous Every 12 hours 06/22/23 2027     06/07/23 0600  ceFEPIme (MAXIPIME) 2 g in sodium chloride 0.9 % 100 mL IVPB        2 g 200 mL/hr over 30 Minutes Intravenous Every 8 hours 06/07/23 0105     Jun 22, 2023 2030  ceFEPIme (MAXIPIME) 2 g in sodium chloride 0.9 % 100 mL IVPB        2 g 200 mL/hr over 30 Minutes Intravenous  Once 06-22-23 2020 June 22, 2023 2125   2023-06-22 2030  vancomycin (VANCOREADY) IVPB 1500 mg/300 mL       Placed in "Followed by" Linked Group   1,500 mg 150 mL/hr over 120 Minutes Intravenous  Once 22-Jun-2023 2027 June 22, 2023 2342        Subjective: Today, patient was seen and examined at bedside.  Patient stated that he feels a little better with breathing today.  Has chronic cough.  Denies any chest pain, fever, nausea, vomiting, chills or rigor.  Objective: Vitals:   06/07/23 0404 06/07/23 0900  BP: 134/63 (!) 125/59  Pulse: (!) 58 84  Resp: 20 18  Temp: (!) 97.4 F (36.3 C) (!) 97.5 F (36.4 C)  SpO2: 100% 98%    Intake/Output Summary (Last 24 hours) at 06/07/2023 0935 Last data filed at 06/07/2023 0500 Gross per 24 hour  Intake 0 ml  Output 500 ml  Net -500 ml   There were no vitals filed for this visit. There is no height or weight on file to calculate BMI.   Physical Exam:  GENERAL: Patient is alert awake and oriented. Not in obvious distress.  On nasal cannula oxygen. HENT: No scleral pallor or icterus. Pupils equally reactive to light. Oral mucosa is moist NECK: is supple, no gross swelling noted. CHEST: Coarse breath sounds noted bilaterally diminished breath sounds bilaterally. CVS: S1 and S2 heard, no murmur. Regular rate and rhythm.  ABDOMEN: Soft, non-tender, bowel sounds are present. EXTREMITIES: No edema. CNS: Cranial nerves are intact. No focal motor deficits. SKIN: warm and dry without rashes.  Data Review: I have personally reviewed the following laboratory data and studies,  CBC: Recent Labs  Lab  22-Jun-2023 1540 06/07/23 0250  WBC 10.7* 12.3*  NEUTROABS 7.6  --   HGB 12.6* 13.3  HCT 39.4 40.9  MCV 86.6 86.8  PLT 272 305   Basic Metabolic Panel: Recent Labs  Lab 2023/06/22 1540 06/07/23 0250  NA 128* 133*  K 5.1 4.6  CL 88* 95*  CO2 29 26  GLUCOSE 88 90  BUN 12 9  CREATININE 0.54* 0.54*  CALCIUM 9.2 9.4  MG  --  2.1  PHOS  --  4.6   Liver Function Tests: Recent Labs  Lab 06-22-2023 1540 06/07/23 0250  AST 66* 61*  ALT 70* 68*  ALKPHOS 152* 165*  BILITOT 0.9 1.1  PROT 7.1 7.4  ALBUMIN 2.8* 2.6*   No results for input(s): "LIPASE", "AMYLASE" in the last 168 hours. No results for input(s): "AMMONIA" in the last 168 hours. Cardiac Enzymes: No results for input(s): "CKTOTAL", "CKMB", "CKMBINDEX", "TROPONINI" in the last 168 hours. BNP (last 3 results) Recent Labs    04/19/23 1130 2023/06/22 1540  BNP 386.0* 336.0*    ProBNP (last 3 results) No  results for input(s): "PROBNP" in the last 8760 hours.  CBG: No results for input(s): "GLUCAP" in the last 168 hours. Recent Results (from the past 240 hours)  Resp panel by RT-PCR (RSV, Flu A&B, Covid) Anterior Nasal Swab     Status: None   Collection Time: 06/06/23  3:28 PM   Specimen: Anterior Nasal Swab  Result Value Ref Range Status   SARS Coronavirus 2 by RT PCR NEGATIVE NEGATIVE Final    Comment: (NOTE) SARS-CoV-2 target nucleic acids are NOT DETECTED.  The SARS-CoV-2 RNA is generally detectable in upper respiratory specimens during the acute phase of infection. The lowest concentration of SARS-CoV-2 viral copies this assay can detect is 138 copies/mL. A negative result does not preclude SARS-Cov-2 infection and should not be used as the sole basis for treatment or other patient management decisions. A negative result may occur with  improper specimen collection/handling, submission of specimen other than nasopharyngeal swab, presence of viral mutation(s) within the areas targeted by this assay, and  inadequate number of viral copies(<138 copies/mL). A negative result must be combined with clinical observations, patient history, and epidemiological information. The expected result is Negative.  Fact Sheet for Patients:  BloggerCourse.com  Fact Sheet for Healthcare Providers:  SeriousBroker.it  This test is no t yet approved or cleared by the [place] FDA and  has been authorized for detection and/or diagnosis of SARS-CoV-2 by FDA under an Emergency Use Authorization (EUA). This EUA will remain  in effect (meaning this test can be used) for the duration of the COVID-19 declaration under Section 564(b)(1) of the Act, 21 U.S.C.section 360bbb-3(b)(1), unless the authorization is terminated  or revoked sooner.       Influenza A by PCR NEGATIVE NEGATIVE Final   Influenza B by PCR NEGATIVE NEGATIVE Final    Comment: (NOTE) The Xpert Xpress SARS-CoV-2/FLU/RSV plus assay is intended as an aid in the diagnosis of influenza from Nasopharyngeal swab specimens and should not be used as a sole basis for treatment. Nasal washings and aspirates are unacceptable for Xpert Xpress SARS-CoV-2/FLU/RSV testing.  Fact Sheet for Patients: BloggerCourse.com  Fact Sheet for Healthcare Providers: SeriousBroker.it  This test is not yet approved or cleared by the [place] FDA and has been authorized for detection and/or diagnosis of SARS-CoV-2 by FDA under an Emergency Use Authorization (EUA). This EUA will remain in effect (meaning this test can be used) for the duration of the COVID-19 declaration under Section 564(b)(1) of the Act, 21 U.S.C. section 360bbb-3(b)(1), unless the authorization is terminated or revoked.     Resp Syncytial Virus by PCR NEGATIVE NEGATIVE Final    Comment: (NOTE) Fact Sheet for Patients: BloggerCourse.com  Fact Sheet for Healthcare  Providers: SeriousBroker.it  This test is not yet approved or cleared by the [place] FDA and has been authorized for detection and/or diagnosis of SARS-CoV-2 by FDA under an Emergency Use Authorization (EUA). This EUA will remain in effect (meaning this test can be used) for the duration of the COVID-19 declaration under Section 564(b)(1) of the Act, 21 U.S.C. section 360bbb-3(b)(1), unless the authorization is terminated or revoked.  Performed at [facility], [address] [place], [place] [address]   MRSA Next Gen by PCR, Nasal     Status: None   Collection Time: 06/06/23  8:23 PM   Specimen: Nasal Mucosa; Nasal Swab  Result Value Ref Range Status   MRSA by PCR Next Gen NOT DETECTED NOT DETECTED Final    Comment: (NOTE) The GeneXpert  MRSA Assay (FDA approved for NASAL specimens only), is one component of a comprehensive MRSA colonization surveillance program. It is not intended to diagnose MRSA infection nor to guide or monitor treatment for MRSA infections. Test performance is not FDA approved in patients less than 21 years old. Performed at [facility], [address] [place], [place] [address]   Blood culture (routine x 2)     Status: None (Preliminary result)   Collection Time: 06/06/23  8:44 PM   Specimen: BLOOD RIGHT HAND  Result Value Ref Range Status   Specimen Description BLOOD RIGHT HAND  Final   Special Requests   Final    BOTTLES DRAWN AEROBIC AND ANAEROBIC Blood Culture adequate volume   Culture   Final    NO GROWTH < 12 HOURS Performed at [facility], [address], [place], [place] [address]    Report Status PENDING  Incomplete  Blood culture (routine x 2)     Status: None (Preliminary result)   Collection Time: 06/06/23  8:51 PM   Specimen: Left Antecubital; Blood  Result Value Ref Range Status   Specimen Description LEFT ANTECUBITAL  Final   Special Requests   Final    BOTTLES DRAWN AEROBIC AND ANAEROBIC Blood Culture  adequate volume   Culture   Final    NO GROWTH < 12 HOURS Performed at [facility], [address] [place], [place] [address]    Report Status PENDING  Incomplete     Studies: CT Chest W Contrast Result Date: 06/06/2023 CLINICAL DATA:  Follow-up left-sided pleural effusion EXAM: CT CHEST WITH CONTRAST TECHNIQUE: Multidetector CT imaging of the chest was performed during intravenous contrast administration. RADIATION DOSE REDUCTION: This exam was performed according to the departmental dose-optimization program which includes automated exposure control, adjustment of the mA and/or kV according to patient size and/or use of iterative reconstruction technique. CONTRAST:  75mL OMNIPAQUE IOHEXOL 300 MG/ML  SOLN COMPARISON:  05/26/2023, 04/19/2023 FINDINGS: Cardiovascular: Atherosclerotic calcifications of the thoracic aorta are noted without aneurysmal dilatation or dissection. Heavy coronary calcifications are seen. The heart is at the upper limits of normal in size. No sizable central pulmonary emboli are noted. Mediastinum/Nodes: Thoracic inlet is within normal limits. Scattered likely reactive lymph nodes are noted in the mediastinum similar to that seen on the prior exam. The largest of these is in the subcarinal region measuring 12 mm slightly decreased when compared with the prior exam. The hilar nodes are also slightly decreased in size when compared with the prior study. The esophagus is air-filled and within normal limits. A small sliding-type hiatal hernia is noted. Lungs/Pleura: Bilateral pleural effusions are again identified left worse than right with fluid trapped in the major fissure on the left. The right-sided effusion is stable when compared with 05/26/2023 but increased when compared with the prior exam from February of 2025. Persistent diffuse fibrotic changes are identified in both lungs with some area of adjacent consolidation particularly in the left lower lobe also stable from the prior  study. No new parenchymal nodules or areas of consolidation are noted. Upper Abdomen: Visualized upper abdomen shows no acute abnormality. Some scarring in the right kidney is noted. Musculoskeletal: Multiple sclerotic lesions are again identified throughout the bony structures to include the ribcage, thoracic spine and proximal humeri bilaterally. Scapular and sternal lesions are noted as well. These are again highly suggestive of metastatic prostate disease. Correlation with PSA is recommended. IMPRESSION: Overall stable appearance of the pleural effusions bilaterally left greater than right.  Persistent fibrotic change and left lower lobe consolidation is seen and stable. No new areas of consolidation are noted. Hiatal hernia. Extensive sclerotic osseous lesions highly suggestive of metastatic prostate carcinoma. Correlate with serum PSA. These are relatively stable when compared with the prior exam. Hilar and mediastinal lymph nodes likely reactive in nature and slightly decreased when compared with the prior exam. Aortic Atherosclerosis (ICD10-I70.0). Electronically Signed   By: Alcide Clever M.D.   On: 06/06/2023 19:32   DG Chest 2 View Result Date: 06/06/2023 CLINICAL DATA:  Shortness of breath. EXAM: CHEST - 2 VIEW COMPARISON:  04/19/2023. FINDINGS: Re-demonstration of left retrocardiac airspace opacity obscuring the left hemidiaphragm, descending thoracic aorta and blunting the left lateral costophrenic angle, suggesting combination of left lung atelectasis and/or consolidation with pleural effusion. No significant interval change. There also diffuse reticulonodular opacities throughout bilateral lungs, similar to prior study and corresponds to underlying chronic interstitial lung disease. No acute consolidation or lung collapse. There is trace right pleural effusion as well, unchanged. Stable cardio-mediastinal silhouette. No acute osseous abnormalities. The soft tissues are within normal limits.  IMPRESSION: *No significant interval change since the prior study. Redemonstration of left retrocardiac airspace opacity, likely combination of left lung atelectasis and/or consolidation with pleural effusion. Electronically Signed   By: Jules Schick M.D.   On: 06/06/2023 15:22      Joycelyn Das, MD  [facility] 06/07/2023  If 7PM-7AM, please contact night-coverage

## 2023-06-08 ENCOUNTER — Inpatient Hospital Stay (HOSPITAL_COMMUNITY)

## 2023-06-08 DIAGNOSIS — J9601 Acute respiratory failure with hypoxia: Secondary | ICD-10-CM | POA: Diagnosis not present

## 2023-06-08 DIAGNOSIS — J189 Pneumonia, unspecified organism: Secondary | ICD-10-CM | POA: Diagnosis not present

## 2023-06-08 DIAGNOSIS — I10 Essential (primary) hypertension: Secondary | ICD-10-CM | POA: Diagnosis not present

## 2023-06-08 DIAGNOSIS — J90 Pleural effusion, not elsewhere classified: Secondary | ICD-10-CM | POA: Diagnosis not present

## 2023-06-08 LAB — PSA: Prostatic Specific Antigen: 0.71 ng/mL (ref 0.00–4.00)

## 2023-06-08 LAB — CBC
HCT: 36.3 % — ABNORMAL LOW (ref 39.0–52.0)
Hemoglobin: 11.7 g/dL — ABNORMAL LOW (ref 13.0–17.0)
MCH: 28 pg (ref 26.0–34.0)
MCHC: 32.2 g/dL (ref 30.0–36.0)
MCV: 86.8 fL (ref 80.0–100.0)
Platelets: 248 10*3/uL (ref 150–400)
RBC: 4.18 MIL/uL — ABNORMAL LOW (ref 4.22–5.81)
RDW: 17.7 % — ABNORMAL HIGH (ref 11.5–15.5)
WBC: 15.3 10*3/uL — ABNORMAL HIGH (ref 4.0–10.5)
nRBC: 0 % (ref 0.0–0.2)

## 2023-06-08 LAB — BASIC METABOLIC PANEL WITH GFR
Anion gap: 9 (ref 5–15)
BUN: 17 mg/dL (ref 8–23)
CO2: 27 mmol/L (ref 22–32)
Calcium: 8.9 mg/dL (ref 8.9–10.3)
Chloride: 97 mmol/L — ABNORMAL LOW (ref 98–111)
Creatinine, Ser: 0.49 mg/dL — ABNORMAL LOW (ref 0.61–1.24)
GFR, Estimated: >60 mL/min (ref >60)
Glucose, Bld: 114 mg/dL — ABNORMAL HIGH (ref 70–99)
Potassium: 4.7 mmol/L (ref 3.5–5.1)
Sodium: 133 mmol/L — ABNORMAL LOW (ref 135–145)

## 2023-06-08 LAB — STREP PNEUMONIAE URINARY ANTIGEN: Strep Pneumo Urinary Antigen: NEGATIVE

## 2023-06-08 LAB — MAGNESIUM: Magnesium: 2.3 mg/dL (ref 1.7–2.4)

## 2023-06-08 LAB — BODY FLUID CELL COUNT WITH DIFFERENTIAL
Eos, Fluid: 0 %
Lymphs, Fluid: 61 %
Monocyte-Macrophage-Serous Fluid: 37 % — ABNORMAL LOW (ref 50–90)
Neutrophil Count, Fluid: 2 % (ref 0–25)
Total Nucleated Cell Count, Fluid: 447 uL (ref 0–1000)

## 2023-06-08 MED ORDER — APIXABAN 5 MG PO TABS
5.0000 mg | ORAL_TABLET | Freq: Two times a day (BID) | ORAL | Status: DC
  Administered 2023-06-08 – 2023-06-13 (×11): 5 mg via ORAL
  Filled 2023-06-08 (×11): qty 1

## 2023-06-08 MED ORDER — SOTALOL HCL 120 MG PO TABS
120.0000 mg | ORAL_TABLET | Freq: Two times a day (BID) | ORAL | Status: DC
  Administered 2023-06-08 – 2023-06-13 (×11): 120 mg via ORAL
  Filled 2023-06-08 (×12): qty 1

## 2023-06-08 NOTE — Progress Notes (Signed)
 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 Yesterday, he presented to the ED with worsening SOB, DOE, chills, mild wheezing, and coughing tan sputum. Denied fever, rigors, CP, URTI symptoms, aspirations, GERD, rash, N/V/D, and abd pain. Has has chronic trace feet edema. Smoked tobacco but quit 20 yrs ago. In the ED, SpO2 90% on RA. Started on Vanco, Cefepime, and solumedrol. He had left thoracentesis today 250 cc amber colored fluid sent to w/u.  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 rate 18, SpO2 99%.        Intake/Output Summary (Last 24 hours) at 06/08/2023 1608 Last data filed at 06/08/2023 0600 Gross per 24 hour  Intake 581.87 ml  Output 1100 ml  Net -518.13 ml   There were no vitals filed for this visit.  Examination: General: alert, oriented x4, and comfortable.   HENT: PERL, normal pharynx and oral mucosa. No LNE or thyromegaly. No JVD Lungs: symmetrical air entry bilaterally. Coarse crackles and mild scattered wheezing Cardiovascular: NL S1/S2. No m/g/r Abdomen: no  distension or tenderness Extremities: trace edema. Symmetrical  Neuro: nonfocal    Resolved Hospital Problem list     Assessment & Plan:  Non resolving LLL CAP and acute hypoxia  -MRSA screening is negative -Cefepime and Zithromax #2 -Urine Ags: step is negative, legionella: pending -Sputum Cx: pending  -f/u Bcx2: pending  -f/u pleural effusion w/u: exudative   -I/S -HTS neb -Duoneb  -May need bronchoscopy for BAL if he is not improving -Steroids -ISS   06/04/2023, PFT: severe restriction, corrected Dlco is NL , no obstruction.    Rh.A related ILD, on Albuterol PRN (no home O2) -as above   Bilateral pleural effusions (left >right), s/p left thoracentesis, sent to w/u -f/u pleural effusion w/u: exudative     HTN, atrial flutter, chronic S-CHF (Nov 2024, Echo: EF 35% and G1DD) -s/p IV Lasix x1 -Repeat Echo: pending    Labs   CBC: Recent Labs  Lab 06/06/23 1540 06/07/23 0250 06/08/23 0702  WBC 10.7* 12.3* 15.3*  NEUTROABS 7.6  --   --   HGB 12.6* 13.3 11.7*  HCT 39.4 40.9 36.3*  MCV 86.6 86.8 86.8  PLT 272 305 248    Basic Metabolic Panel: Recent Labs  Lab 06/06/23 1540 06/07/23 0250 06/08/23 0702  NA 128* 133* 133*  K 5.1 4.6 4.7  CL 88* 95* 97*  CO2 29 26 27   GLUCOSE 88 90 114*  BUN 12 9 17   CREATININE 0.54* 0.54* 0.49*  CALCIUM 9.2 9.4 8.9  MG  --  2.1 2.3  PHOS  --  4.6  --    GFR: Estimated Creatinine Clearance: 79 mL/min (A) (by C-G formula based on SCr of 0.49 mg/dL (L)). Recent Labs  Lab 06/06/23 1540 06/07/23 0250 06/08/23 0702  WBC 10.7* 12.3* 15.3*    Liver Function Tests: Recent Labs  Lab 06/06/23 1540 06/07/23 0250  AST 66* 61*  ALT 70* 68*  ALKPHOS 152* 165*  BILITOT 0.9 1.1  PROT 7.1 7.4  ALBUMIN 2.8* 2.6*   No results for input(s): "LIPASE", "AMYLASE" in the last 168 hours. No results for input(s): "AMMONIA" in the last 168 hours.  ABG    Component Value Date/Time   TCO2 25 04/07/2015 1814     Coagulation  Profile: No results for input(s): "INR", "PROTIME" in the last 168 hours.  Cardiac Enzymes: No results for input(s): "CKTOTAL", "CKMB", "CKMBINDEX", "TROPONINI" in the last 168 hours.  HbA1C: Hgb A1c MFr Bld  Date/Time Value Ref Range Status  05/09/2018 08:46 PM 5.6 4.8 - 5.6 % Final    Comment:    (NOTE) Pre diabetes:          5.7%-6.4% Diabetes:              >6.4% Glycemic control for   <7.0% adults with diabetes   01/31/2018 02:59 AM 5.4 4.8 - 5.6 % Final    Comment:    (NOTE) Pre diabetes:          5.7%-6.4% Diabetes:              >6.4% Glycemic control for   <7.0% adults with diabetes     CBG: No results for input(s): "GLUCAP" in the last 168 hours.  Review of Systems:   Review of Systems  Constitutional:  Negative for chills, diaphoresis and fever.  HENT:  Positive for congestion and sinus pain. Negative for sore throat.   Respiratory:  Positive for cough and shortness of breath. Negative for hemoptysis, sputum production, wheezing and stridor.   Cardiovascular:  Positive for leg swelling. Negative for chest pain, palpitations and orthopnea.  Gastrointestinal:  Negative for abdominal pain, diarrhea, nausea and vomiting.  Genitourinary:  Negative for dysuria.  Musculoskeletal:  Negative for myalgias.     Past Medical History:  He,  has a past medical history of Abnormal CT scan, stomach, Chronic systolic dysfunction of left ventricle, Coronary artery disease, GERD (gastroesophageal reflux disease), Hyperlipidemia, Hypertension, Ischemic cardiomyopathy, LBBB (left bundle branch block), Leukocytosis, Lymphadenopathy, Myocardial infarction (HCC) (02/2015), Psoriasis (2003), Psoriatic arthritis (HCC), Pulmonary fibrosis (HCC), Rheumatoid arthritis(714.0) (2012), and Typical atrial flutter (HCC).   Surgical History:   Past Surgical History:  Procedure Laterality Date   CARDIAC CATHETERIZATION N/A 02/22/2015   Procedure: Left Heart Cath and Coronary Angiography;   Surgeon: Runell Gess, MD;  Location: [facility] INVASIVE CV LAB;  Service: Cardiovascular;  Laterality: N/A;   CARDIAC CATHETERIZATION N/A 02/22/2015   Procedure: Coronary Stent Intervention;  Surgeon: Runell Gess, MD;  Location: [facility] INVASIVE CV LAB;  Service: Cardiovascular;  Laterality: N/A;   CARDIOVERSION N/A 04/12/2015   Procedure: CARDIOVERSION;  Surgeon: Laurey Morale, MD;  Location: [facility] ENDOSCOPY;  Service: Cardiovascular;  Laterality: N/A;   COLONOSCOPY  07/01/2003   WUJ:[id number] colonic mucosa except for the proximal right colon in the area of ICV which was not seen completely due to inadequate bowel prep. followed with ACBE which was normal.    COLONOSCOPY N/A 08/24/2015   Dr. Jena Gauss: Normal colon. Next colonoscopy in  10 years.   CORONARY BALLOON ANGIOPLASTY N/A 05/12/2018   Procedure: CORONARY BALLOON ANGIOPLASTY;  Surgeon: Lyn Records, MD;  Location: [facility] INVASIVE CV LAB;  Service: Cardiovascular;  Laterality: N/A;   ELECTROPHYSIOLOGIC STUDY N/A 05/30/2015   Atrial fibrillation ablation by Dr Johney Frame   ESOPHAGOGASTRODUODENOSCOPY N/A 08/24/2015   Dr. Jena Gauss: Medium-sized hiatal hernia, erosive gastropathy. Cameron lesions. Esophageal mucosa distally suggestive of short segment Barrett's esophagus. Not confirmed on biopsy. Gastric biopsy with minimal chronic inflammation   GIVENS CAPSULE STUDY N/A 04/17/2016   Procedure: GIVENS CAPSULE STUDY;  Surgeon: Corbin Ade, MD;  Location: [facility] ENDO SUITE;  Service: Endoscopy;  Laterality: N/A;  Pt to arrive at 8:00 am for 8:30 am appt   LEFT HEART CATH AND CORONARY ANGIOGRAPHY N/A 05/11/2018   Procedure: LEFT HEART CATH AND CORONARY ANGIOGRAPHY;  Surgeon: Lyn Records, MD;  Location: [facility] INVASIVE CV LAB;  Service: Cardiovascular;  Laterality: N/A;   TEE WITHOUT CARDIOVERSION N/A 04/12/2015   Procedure: TRANSESOPHAGEAL ECHOCARDIOGRAM (TEE);  Surgeon: Laurey Morale, MD;  Location: [facility] ENDOSCOPY;  Service: Cardiovascular;  Laterality: N/A;     Social  History:   reports that he quit smoking about 20 years ago. His smoking use included cigarettes. He started smoking about 50 years ago. He has a 30 pack-year smoking history. He has been exposed to tobacco smoke. He has never used smokeless tobacco. He reports that he does not drink alcohol and does not use drugs.   Family History:  His family history includes Hypertension in his mother. There is no history of Colon cancer.   Allergies No Known Allergies   Home Medications  Prior to Admission medications   Medication Sig Start Date End Date Taking? Authorizing Provider  albuterol (VENTOLIN HFA) 108 (90 Base) MCG/ACT inhaler Inhale 2 puffs into the lungs every 6 (six) hours as needed for wheezing or shortness of breath. 03/12/23  Yes Oretha Milch, MD  amLODipine (NORVASC) 5 MG tablet Take 1 tablet (5 mg total) by mouth daily. 04/24/23  Yes Runell Gess, MD  apixaban (ELIQUIS) 5 MG TABS tablet TAKE 1 TABLET TWICE DAILY 05/15/23  Yes Runell Gess, MD  atorvastatin (LIPITOR) 80 MG tablet TAKE 1 TABLET EVERY DAY AT 6 PM (APPOINTMENT IS NEEDED) 05/21/22  Yes Runell Gess, MD  azaTHIOprine (IMURAN) 50 MG tablet TAKE 3 TABLETS EVERY DAY 03/31/23  Yes Gearldine Bienenstock, PA-C  clobetasol (TEMOVATE) 0.05 % external solution Apply 1 Application topically 2 (two) times daily. Patient taking differently: Apply 1 Application topically 2 (two) times daily as needed (Dermatitis). 01/24/23  Yes Gearldine Bienenstock, PA-C  Clobetasol Prop Emollient Base (CLOBETASOL PROPIONATE E) 0.05 % emollient cream APPLY TO AFFECTED AREA TWICE A DAY AS NEEDED 04/04/23  Yes Gearldine Bienenstock, PA-C  clopidogrel (PLAVIX) 75 MG tablet Take 1 tablet (75 mg total) by mouth daily. 04/24/23  Yes Runell Gess, MD  furosemide (LASIX) 40 MG tablet TAKE 1 TABLET TWICE DAILY 06/17/22  Yes Runell Gess, MD  losartan (COZAAR) 100 MG tablet TAKE 1 TABLET EVERY DAY 05/21/22  Yes Runell Gess, MD  pantoprazole (PROTONIX) 40 MG tablet  TAKE 1 TABLET TWICE DAILY 11/02/20  Yes Gelene Mink, NP  potassium chloride SA (KLOR-CON M) 20 MEQ tablet TAKE 2 TABLETS TWICE DAILY 10/31/22  Yes Runell Gess, MD  predniSONE (DELTASONE) 5 MG tablet TAKE 1 TABLET DAILY WITH BREAKFAST. 04/16/23  Yes Gearldine Bienenstock, PA-C  sotalol (BETAPACE) 120  MG tablet TAKE 1 TABLET EVERY 12 HOURS 10/31/22  Yes Runell Gess, MD  Abatacept (ORENCIA CLICKJECT) 125 MG/ML SOAJ Inject 125 mg into the skin once a week. Patient not taking: Reported on 04/08/2023 12/31/22   Gearldine Bienenstock, PA-C  doxycycline (ADOXA) 100 MG tablet Take 100 mg by mouth 2 (two) times daily. Patient not taking: Reported on 06/08/2023 05/15/23   [provider]    Patrici Ranks, MD  [facility] Personal contact information can be found on Amion  If no contact or response made please call [phone]

## 2023-06-08 NOTE — Progress Notes (Signed)
 PROGRESS NOTE  Curtis Flores:[id number] DOB: [birth year] DOA: 06/06/2023 PCP: Ladora Daniel, PA-C   LOS: 2 days   Brief narrative:   Curtis Flores is a 68 y.o. male with medical history significant of hypertension, hyperlipidemia, pulmonary fibrosis, rheumatoid arthritis, atrial flutter, chronic HFrEF presented to the emergency department complaints of shortness of breath.  Of note patient was recently diagnosed with left lower lobe pneumonia on 04/19/2023 and had completed Levaquin course at that time.  He had been having persistent symptoms of shortness of breath subjective fever productive cough since then but then the symptoms got worse so he decided to come to the hospital.  In the ED patient was noted to be tachypneic with mild bradycardia.  Pulse ox was 90% on room air.  WBC was 10.7.  Sodium was low at 128 with albumin low at 2.8.  AST 66, ALT 70, ALP 152, BNP 336 (this was 386 on 04/19/2023).  Influenza A, B, SARS, RSV was negative.  CT chest with contrast overall  stable appearance of the pleural effusions bilaterally left greater than right. Persistent fibrotic change and left lower lobe consolidation is seen and stable.  Chest x-ray showed redemonstration of left retrocardiac airspace opacity, likely combination of left lung atelectasis and/or consolidation with pleural effusion. Pulmonologist (Dr. Vassie Loll) was consulted and recommended broad-spectrum antibiotic, Solu-Medrol in pleural tapping.  He was treated with IV vancomycin and cefepime, IV Solu-Medrol was started.  Patient was then admitted hospital for further evaluation and treatment.   Assessment/Plan: Principal Problem:   HCAP (healthcare-associated pneumonia) Active Problems:   Pulmonary fibrosis (HCC)   Mixed hyperlipidemia   GERD (gastroesophageal reflux disease)   Essential hypertension   Bilateral pleural effusion   Hyponatremia   Transaminitis   Elevated brain natriuretic peptide (BNP) level   Prostate cancer metastatic  to bone (HCC)  HCAP in the setting of pulmonary fibrosis Continue  cefepime, azithromycin.  Vancomycin has been discontinued.  Blood cultures negative in 1 day.  Continue Solu-Medrol Mucinex incentive spirometry flutter valve.  Urinary Legionella and strep pneumoniae antigen pending.  Continue nebulizers.   Bilateral pleural effusion (L > R) Status post thoracocentesis with left lung collapse.  Will continue with incentive spirometry flutter valve.  Repeat chest x-ray from today shows improved aeration.  Pulmonary following.     Acute respiratory failure with hypoxia  related to pulmonary fibrosis and pneumonia.  Continue supplemental oxygen to maintain O2 sats > 92% p, continue nasal cannula oxygen currently at 1 L/min.   Hyponatremia Sodium level of 133 today from 128 on presentation.  Will monitor.  Elevated LFTs. AST 66, ALT 70, ALP 152 on presentation..  No abdominal pain.  Right upper quadrant ultrasound without any gallstones or ductal dilatation.  Will check CMP in AM.  Hypoalbuminemia possible secondary to moderate protein calorie malnutrition Albumin 2.8, encourage oral nutrition.  Will consult dietary services.   Metastatic prostate cancer CT chest with contrast was suggestive of extensive sclerotic osseous lesions highly suggestive of metastatic prostate carcinoma.  PSA was 0.7.  Might need urology to follow the patient as outpatient.   Essential hypertension Continue amlodipine, losartan.  Latest blood pressure of 137/69   Mixed hyperlipidemia Hold statins due to elevated LFT.  Check LFTs in AM.   GERD Continue Protonix   DVT prophylaxis: SCDs Start: 06/07/23 0031   Disposition: Likely home with home health in 1 to 2 days when clinically improved  Status is: Inpatient Remains inpatient appropriate because: Pending clinical improvement,  status post thoracocentesis, IV antibiotics,    Code Status:     Code Status: Full Code  Family Communication:   None at  bedside.   Consultants: PCCM  Procedures: Ultrasound-guided thoracocentesis on 06/07/2023 with removal of 50 mL of amber-colored fluid.  Anti-infectives:  Azithromycin cefepime  Anti-infectives (From admission, onward)    Start     Dose/Rate Route Frequency Ordered Stop   06/07/23 1200  azithromycin (ZITHROMAX) 500 mg in sodium chloride 0.9 % 250 mL IVPB        500 mg 250 mL/hr over 60 Minutes Intravenous Every 24 hours 06/07/23 1109 06/10/23 1159   06/07/23 1000  vancomycin (VANCOREADY) IVPB 750 mg/150 mL  Status:  Discontinued       Placed in "Followed by" Linked Group   750 mg 150 mL/hr over 60 Minutes Intravenous Every 12 hours 06/06/23 2027 06/07/23 1231   06/07/23 0600  ceFEPIme (MAXIPIME) 2 g in sodium chloride 0.9 % 100 mL IVPB        2 g 200 mL/hr over 30 Minutes Intravenous Every 8 hours 06/07/23 0105     06/06/23 2030  ceFEPIme (MAXIPIME) 2 g in sodium chloride 0.9 % 100 mL IVPB        2 g 200 mL/hr over 30 Minutes Intravenous  Once 06/06/23 2020 06/06/23 2125   06/06/23 2030  vancomycin (VANCOREADY) IVPB 1500 mg/300 mL       Placed in "Followed by" Linked Group   1,500 mg 150 mL/hr over 120 Minutes Intravenous  Once 06/06/23 2027 06/06/23 2342       Subjective: Today, patient was seen and examined at bedside.  Patient states that he has some cough and mild shortness of breath but feels better.  Denies any pain, nausea, vomiting, fever, chills or rigor.   Objective: Vitals:   06/08/23 0845 06/08/23 0912  BP: 137/69   Pulse: 71   Resp: 20   Temp: 97.8 F (36.6 C)   SpO2: 96% 97%    Intake/Output Summary (Last 24 hours) at 06/08/2023 1021 Last data filed at 06/08/2023 0600 Gross per 24 hour  Intake 581.87 ml  Output 1600 ml  Net -1018.13 ml   There were no vitals filed for this visit. There is no height or weight on file to calculate BMI.   Physical Exam:  GENERAL: Patient is alert awake and oriented. Not in obvious distress.  On nasal cannula  oxygen. HENT: No scleral pallor or icterus. Pupils equally reactive to light. Oral mucosa is moist NECK: is supple, no gross swelling noted. CHEST: Coarse breath sounds bilaterally mostly over the left side.   CVS: S1 and S2 heard, no murmur. Regular rate and rhythm.  ABDOMEN: Soft, non-tender, bowel sounds are present. EXTREMITIES: No edema. CNS: Cranial nerves are intact. No focal motor deficits. SKIN: warm and dry without rashes.  Data Review: I have personally reviewed the following laboratory data and studies,  CBC: Recent Labs  Lab 06/06/23 1540 06/07/23 0250 06/08/23 0702  WBC 10.7* 12.3* 15.3*  NEUTROABS 7.6  --   --   HGB 12.6* 13.3 11.7*  HCT 39.4 40.9 36.3*  MCV 86.6 86.8 86.8  PLT 272 305 248   Basic Metabolic Panel: Recent Labs  Lab 06/06/23 1540 06/07/23 0250 06/08/23 0702  NA 128* 133* 133*  K 5.1 4.6 4.7  CL 88* 95* 97*  CO2 29 26 27   GLUCOSE 88 90 114*  BUN 12 9 17   CREATININE 0.54* 0.54* 0.49*  CALCIUM 9.2  9.4 8.9  MG  --  2.1 2.3  PHOS  --  4.6  --    Liver Function Tests: Recent Labs  Lab 06/06/23 1540 06/07/23 0250  AST 66* 61*  ALT 70* 68*  ALKPHOS 152* 165*  BILITOT 0.9 1.1  PROT 7.1 7.4  ALBUMIN 2.8* 2.6*   No results for input(s): "LIPASE", "AMYLASE" in the last 168 hours. No results for input(s): "AMMONIA" in the last 168 hours. Cardiac Enzymes: No results for input(s): "CKTOTAL", "CKMB", "CKMBINDEX", "TROPONINI" in the last 168 hours. BNP (last 3 results) Recent Labs    04/19/23 1130 06/06/23 1540  BNP 386.0* 336.0*    ProBNP (last 3 results) No results for input(s): "PROBNP" in the last 8760 hours.  CBG: No results for input(s): "GLUCAP" in the last 168 hours. Recent Results (from the past 240 hours)  Resp panel by RT-PCR (RSV, Flu A&B, Covid) Anterior Nasal Swab     Status: None   Collection Time: 06/06/23  3:28 PM   Specimen: Anterior Nasal Swab  Result Value Ref Range Status   SARS Coronavirus 2 by RT PCR  NEGATIVE NEGATIVE Final    Comment: (NOTE) SARS-CoV-2 target nucleic acids are NOT DETECTED.  The SARS-CoV-2 RNA is generally detectable in upper respiratory specimens during the acute phase of infection. The lowest concentration of SARS-CoV-2 viral copies this assay can detect is 138 copies/mL. A negative result does not preclude SARS-Cov-2 infection and should not be used as the sole basis for treatment or other patient management decisions. A negative result may occur with  improper specimen collection/handling, submission of specimen other than nasopharyngeal swab, presence of viral mutation(s) within the areas targeted by this assay, and inadequate number of viral copies(<138 copies/mL). A negative result must be combined with clinical observations, patient history, and epidemiological information. The expected result is Negative.  Fact Sheet for Patients:  BloggerCourse.com  Fact Sheet for Healthcare Providers:  SeriousBroker.it  This test is no t yet approved or cleared by the [place] FDA and  has been authorized for detection and/or diagnosis of SARS-CoV-2 by FDA under an Emergency Use Authorization (EUA). This EUA will remain  in effect (meaning this test can be used) for the duration of the COVID-19 declaration under Section 564(b)(1) of the Act, 21 U.S.C.section 360bbb-3(b)(1), unless the authorization is terminated  or revoked sooner.       Influenza A by PCR NEGATIVE NEGATIVE Final   Influenza B by PCR NEGATIVE NEGATIVE Final    Comment: (NOTE) The Xpert Xpress SARS-CoV-2/FLU/RSV plus assay is intended as an aid in the diagnosis of influenza from Nasopharyngeal swab specimens and should not be used as a sole basis for treatment. Nasal washings and aspirates are unacceptable for Xpert Xpress SARS-CoV-2/FLU/RSV testing.  Fact Sheet for Patients: BloggerCourse.com  Fact Sheet for  Healthcare Providers: SeriousBroker.it  This test is not yet approved or cleared by the [place] FDA and has been authorized for detection and/or diagnosis of SARS-CoV-2 by FDA under an Emergency Use Authorization (EUA). This EUA will remain in effect (meaning this test can be used) for the duration of the COVID-19 declaration under Section 564(b)(1) of the Act, 21 U.S.C. section 360bbb-3(b)(1), unless the authorization is terminated or revoked.     Resp Syncytial Virus by PCR NEGATIVE NEGATIVE Final    Comment: (NOTE) Fact Sheet for Patients: BloggerCourse.com  Fact Sheet for Healthcare Providers: SeriousBroker.it  This test is not yet approved or cleared by the [place] FDA and has  been authorized for detection and/or diagnosis of SARS-CoV-2 by FDA under an Emergency Use Authorization (EUA). This EUA will remain in effect (meaning this test can be used) for the duration of the COVID-19 declaration under Section 564(b)(1) of the Act, 21 U.S.C. section 360bbb-3(b)(1), unless the authorization is terminated or revoked.  Performed at [facility], [address], [place], [place] [address]   MRSA Next Gen by PCR, Nasal     Status: None   Collection Time: 06/06/23  8:23 PM   Specimen: Nasal Mucosa; Nasal Swab  Result Value Ref Range Status   MRSA by PCR Next Gen NOT DETECTED NOT DETECTED Final    Comment: (NOTE) The GeneXpert MRSA Assay (FDA approved for NASAL specimens only), is one component of a comprehensive MRSA colonization surveillance program. It is not intended to diagnose MRSA infection nor to guide or monitor treatment for MRSA infections. Test performance is not FDA approved in patients less than 71 years old. Performed at [facility], [address], [place], [place] [address]   Blood culture (routine x 2)     Status: None (Preliminary result)   Collection Time: 06/06/23  8:44 PM    Specimen: BLOOD RIGHT HAND  Result Value Ref Range Status   Specimen Description BLOOD RIGHT HAND  Final   Special Requests   Final    BOTTLES DRAWN AEROBIC AND ANAEROBIC Blood Culture adequate volume   Culture   Final    NO GROWTH < 12 HOURS Performed at [facility], [address], [place], [place] [address]    Report Status PENDING  Incomplete  Blood culture (routine x 2)     Status: None (Preliminary result)   Collection Time: 06/06/23  8:51 PM   Specimen: Left Antecubital; Blood  Result Value Ref Range Status   Specimen Description LEFT ANTECUBITAL  Final   Special Requests   Final    BOTTLES DRAWN AEROBIC AND ANAEROBIC Blood Culture adequate volume   Culture   Final    NO GROWTH < 12 HOURS Performed at [facility], [address] [place], [place] [address]    Report Status PENDING  Incomplete  Culture, blood (routine x 2) Call MD if unable to obtain prior to antibiotics being given     Status: None (Preliminary result)   Collection Time: 06/07/23  2:48 AM   Specimen: BLOOD RIGHT HAND  Result Value Ref Range Status   Specimen Description BLOOD RIGHT HAND  Final   Special Requests   Final    BOTTLES DRAWN AEROBIC AND ANAEROBIC Blood Culture results may not be optimal due to an inadequate volume of blood received in culture bottles   Culture   Final    NO GROWTH 1 DAY Performed at [facility], [address], [place], [place] [address]    Report Status PENDING  Incomplete  Culture, blood (routine x 2) Call MD if unable to obtain prior to antibiotics being given     Status: None (Preliminary result)   Collection Time: 06/07/23  2:49 AM   Specimen: BLOOD  Result Value Ref Range Status   Specimen Description BLOOD LEFT ANTECUBITAL  Final   Special Requests   Final    BOTTLES DRAWN AEROBIC AND ANAEROBIC Blood Culture adequate volume   Culture   Final    NO GROWTH 1 DAY Performed at [facility], [address] [place], [place] [address]    Report Status  PENDING  Incomplete     Studies:  DG CHEST PORT 1 VIEW Result Date: 06/08/2023 CLINICAL DATA:  Lung collapse. EXAM: PORTABLE CHEST 1 VIEW COMPARISON:  June 07, 2023. FINDINGS: Stable cardiomediastinal silhouette. There is increased aeration of the left upper lobe compared to prior exam. There remains left lung opacity concerning for pneumonia or edema. Probable mild to moderate size left pleural effusion is noted. Right basilar atelectasis, edema or atelectasis is noted with small right pleural effusion. Bony thorax is unremarkable. IMPRESSION: Bilateral lung opacities as noted above. There does appear to be significantly increased aeration of left upper lobe compared to prior exam. Electronically Signed   By: Lupita Raider M.D.   On: 06/08/2023 08:11   DG Chest 1 View Addendum Date: 06/07/2023 ADDENDUM REPORT: 06/07/2023 12:24 ADDENDUM: Study discussed by telephone with Dr. Tyson Babinski on 06/07/2023 at 1214 hours. Electronically Signed   By: Odessa Fleming M.D.   On: 06/07/2023 12:24   Result Date: 06/07/2023 CLINICAL DATA:  68 year old male status post ultrasound-guided left side thoracentesis this morning. EXAM: CHEST  1 VIEW COMPARISON:  Chest CT yesterday. FINDINGS: AP view of the chest 1020 hours. White out of the left lung now, but slightly leftward shift of the mediastinum when compared to the CT scout view yesterday. No pneumothorax is identified. Right lung ventilation with patchy and coarse opacity is stable. Visualized tracheal air column is within normal limits. Negative visible bowel gas. No acute osseous abnormality identified. IMPRESSION: 1. Abnormal white out of the left lung since yesterday, but with some leftward mediastinal shift since that time suggesting this is mucous plugging and lung collapse. 2. No pneumothorax is identified. Electronically Signed: By: Odessa Fleming M.D. On: 06/07/2023 11:57   US Abdomen Limited RUQ (LIVER/GB) Result Date: 06/07/2023 CLINICAL DATA:  Elevated liver function tests  EXAM: ULTRASOUND ABDOMEN LIMITED RIGHT UPPER QUADRANT COMPARISON:  None Available. FINDINGS: Gallbladder: No gallstones or wall thickening visualized. No sonographic Murphy sign noted by sonographer. Common bile duct: Diameter: 5 mm Liver: No focal lesion identified. Within normal limits in parenchymal echogenicity. Portal vein is patent on color Doppler imaging with normal direction of blood flow towards the liver. Other: Small right pleural effusion. IMPRESSION: No gallstones or ductal dilatation. Small right pleural effusion. Please correlate with chest CT of 06/06/2023 Electronically Signed   By: Karen Kays M.D.   On: 06/07/2023 12:01   US THORACENTESIS ASP PLEURAL SPACE W/IMG GUIDE Result Date: 06/07/2023 INDICATION: 68 year old male. Recent history of pneumonia. Admitted for shortness of breath team is requesting a therapeutic and diagnostic left-sided thoracentesis EXAM: ULTRASOUND GUIDED THERAPEUTIC AND DIAGNOSTIC LEFT-SIDED THORACENTESIS MEDICATIONS: Lidocaine 1% 10 mL COMPLICATIONS: None immediate. PROCEDURE: An ultrasound guided thoracentesis was thoroughly discussed with the patient and questions answered. The benefits, risks, alternatives and complications were also discussed. The patient understands and wishes to proceed with the procedure. Written consent was obtained. Ultrasound was performed to localize and mark an adequate pocket of fluid in the left chest. The area was then prepped and draped in the normal sterile fashion. 1% Lidocaine was used for local anesthesia. Under ultrasound guidance a 6 Fr Safe-T-Centesis catheter was introduced. Thoracentesis was performed. The catheter was removed and a dressing applied. FINDINGS: A total of approximately 250 mL of amber color fluid was removed. Samples were sent to the laboratory as requested by the clinical team. IMPRESSION: Successful ultrasound guided therapeutic and diagnostic left-sided thoracentesis yielding 250 mL amber: pleural fluid.  Performed by Anders Grant NP Electronically Signed   By: Gilmer Mor D.O.  On: 06/07/2023 11:19   CT Chest W Contrast Result Date: 06/06/2023 CLINICAL DATA:  Follow-up left-sided pleural effusion EXAM: CT CHEST WITH CONTRAST TECHNIQUE: Multidetector CT imaging of the chest was performed during intravenous contrast administration. RADIATION DOSE REDUCTION: This exam was performed according to the departmental dose-optimization program which includes automated exposure control, adjustment of the mA and/or kV according to patient size and/or use of iterative reconstruction technique. CONTRAST:  75mL OMNIPAQUE IOHEXOL 300 MG/ML  SOLN COMPARISON:  05/26/2023, 04/19/2023 FINDINGS: Cardiovascular: Atherosclerotic calcifications of the thoracic aorta are noted without aneurysmal dilatation or dissection. Heavy coronary calcifications are seen. The heart is at the upper limits of normal in size. No sizable central pulmonary emboli are noted. Mediastinum/Nodes: Thoracic inlet is within normal limits. Scattered likely reactive lymph nodes are noted in the mediastinum similar to that seen on the prior exam. The largest of these is in the subcarinal region measuring 12 mm slightly decreased when compared with the prior exam. The hilar nodes are also slightly decreased in size when compared with the prior study. The esophagus is air-filled and within normal limits. A small sliding-type hiatal hernia is noted. Lungs/Pleura: Bilateral pleural effusions are again identified left worse than right with fluid trapped in the major fissure on the left. The right-sided effusion is stable when compared with 05/26/2023 but increased when compared with the prior exam from February of 2025. Persistent diffuse fibrotic changes are identified in both lungs with some area of adjacent consolidation particularly in the left lower lobe also stable from the prior study. No new parenchymal nodules or areas of consolidation are noted. Upper  Abdomen: Visualized upper abdomen shows no acute abnormality. Some scarring in the right kidney is noted. Musculoskeletal: Multiple sclerotic lesions are again identified throughout the bony structures to include the ribcage, thoracic spine and proximal humeri bilaterally. Scapular and sternal lesions are noted as well. These are again highly suggestive of metastatic prostate disease. Correlation with PSA is recommended. IMPRESSION: Overall stable appearance of the pleural effusions bilaterally left greater than right. Persistent fibrotic change and left lower lobe consolidation is seen and stable. No new areas of consolidation are noted. Hiatal hernia. Extensive sclerotic osseous lesions highly suggestive of metastatic prostate carcinoma. Correlate with serum PSA. These are relatively stable when compared with the prior exam. Hilar and mediastinal lymph nodes likely reactive in nature and slightly decreased when compared with the prior exam. Aortic Atherosclerosis (ICD10-I70.0). Electronically Signed   By: Alcide Clever M.D.   On: 06/06/2023 19:32   DG Chest 2 View Result Date: 06/06/2023 CLINICAL DATA:  Shortness of breath. EXAM: CHEST - 2 VIEW COMPARISON:  04/19/2023. FINDINGS: Re-demonstration of left retrocardiac airspace opacity obscuring the left hemidiaphragm, descending thoracic aorta and blunting the left lateral costophrenic angle, suggesting combination of left lung atelectasis and/or consolidation with pleural effusion. No significant interval change. There also diffuse reticulonodular opacities throughout bilateral lungs, similar to prior study and corresponds to underlying chronic interstitial lung disease. No acute consolidation or lung collapse. There is trace right pleural effusion as well, unchanged. Stable cardio-mediastinal silhouette. No acute osseous abnormalities. The soft tissues are within normal limits. IMPRESSION: *No significant interval change since the prior study. Redemonstration of  left retrocardiac airspace opacity, likely combination of left lung atelectasis and/or consolidation with pleural effusion. Electronically Signed   By: Jules Schick M.D.   On: 06/06/2023 15:22      Joycelyn Das, MD  [facility] 06/08/2023  If 7PM-7AM, please contact night-coverage

## 2023-06-08 NOTE — Progress Notes (Signed)
 Initial Nutrition Assessment  DOCUMENTATION CODES:   Not applicable  INTERVENTION:   Ensure Enlive po BID, each supplement provides 350 kcal and 20 grams of protein.  Encourage PO intake of Regular diet  NUTRITION DIAGNOSIS:   Increased nutrient needs related to chronic illness as evidenced by estimated needs.  GOAL:   Patient will meet greater than or equal to 90% of their needs  MONITOR:   PO intake, Supplement acceptance  REASON FOR ASSESSMENT:   Consult Assessment of nutrition requirement/status  ASSESSMENT:   Pt with PMH of HTN, HLD, pulmonary fibrosis, rheumatoid arthritis, atrial flutter, chronic CHF admitted with HCAP, bilateral pleural effusion s/p thoracocentesis and likely metastatic prostate cancer.   Noted weight has trended down since Jan 2024. Pt with 10% weight loss x 10 months.  Pt being treated for PNA on steroids and antibiotics.   Medications reviewed and include: solumedrol, protonix  Labs reviewed:  Na 133    NUTRITION - FOCUSED PHYSICAL EXAM:  Deferred   Diet Order:   Diet Order             Diet regular Fluid consistency: Thin  Diet effective now                   EDUCATION NEEDS:   Not appropriate for education at this time  Skin:  Skin Assessment: Reviewed RN Assessment  Last BM:  4/4  Height:   Ht Readings from Last 1 Encounters:  06/04/23 5' 6.5" (1.689 m)    Weight:   Wt Readings from Last 1 Encounters:  06/04/23 71.9 kg    BMI:  There is no height or weight on file to calculate BMI.  Estimated Nutritional Needs:   Kcal:  1900-2100  Protein:  95-110 grams  Fluid:  > 1.9 L/day  Cammy Copa., RD, LDN, CNSC See AMiON for contact information

## 2023-06-09 DIAGNOSIS — J90 Pleural effusion, not elsewhere classified: Secondary | ICD-10-CM | POA: Diagnosis not present

## 2023-06-09 DIAGNOSIS — J189 Pneumonia, unspecified organism: Secondary | ICD-10-CM | POA: Diagnosis not present

## 2023-06-09 LAB — CBC
HCT: 35.1 % — ABNORMAL LOW (ref 39.0–52.0)
Hemoglobin: 11.2 g/dL — ABNORMAL LOW (ref 13.0–17.0)
MCH: 28.1 pg (ref 26.0–34.0)
MCHC: 31.9 g/dL (ref 30.0–36.0)
MCV: 88 fL (ref 80.0–100.0)
Platelets: 272 10*3/uL (ref 150–400)
RBC: 3.99 MIL/uL — ABNORMAL LOW (ref 4.22–5.81)
RDW: 18.1 % — ABNORMAL HIGH (ref 11.5–15.5)
WBC: 16.8 10*3/uL — ABNORMAL HIGH (ref 4.0–10.5)
nRBC: 0.2 % (ref 0.0–0.2)

## 2023-06-09 LAB — COMPREHENSIVE METABOLIC PANEL WITH GFR
ALT: 62 U/L — ABNORMAL HIGH (ref 0–44)
AST: 50 U/L — ABNORMAL HIGH (ref 15–41)
Albumin: 2.1 g/dL — ABNORMAL LOW (ref 3.5–5.0)
Alkaline Phosphatase: 110 U/L (ref 38–126)
Anion gap: 3 — ABNORMAL LOW (ref 5–15)
BUN: 19 mg/dL (ref 8–23)
CO2: 33 mmol/L — ABNORMAL HIGH (ref 22–32)
Calcium: 9.3 mg/dL (ref 8.9–10.3)
Chloride: 100 mmol/L (ref 98–111)
Creatinine, Ser: 0.53 mg/dL — ABNORMAL LOW (ref 0.61–1.24)
GFR, Estimated: >60 mL/min (ref >60)
Glucose, Bld: 99 mg/dL (ref 70–99)
Potassium: 5.1 mmol/L (ref 3.5–5.1)
Sodium: 136 mmol/L (ref 135–145)
Total Bilirubin: 0.5 mg/dL (ref 0.0–1.2)
Total Protein: 5.9 g/dL — ABNORMAL LOW (ref 6.5–8.1)

## 2023-06-09 LAB — MAGNESIUM: Magnesium: 2.4 mg/dL (ref 1.7–2.4)

## 2023-06-09 MED ORDER — IPRATROPIUM-ALBUTEROL 0.5-2.5 (3) MG/3ML IN SOLN: 3.0000 mL | Freq: Four times a day (QID) | RESPIRATORY_TRACT | Status: DC | PRN

## 2023-06-09 MED ORDER — POLYETHYLENE GLYCOL 3350 17 G PO PACK
17.0000 g | PACK | Freq: Every day | ORAL | Status: DC
  Administered 2023-06-09 – 2023-06-13 (×4): 17 g via ORAL
  Filled 2023-06-09 (×5): qty 1

## 2023-06-09 MED ORDER — ZINC OXIDE 40 % EX OINT
TOPICAL_OINTMENT | Freq: Every day | CUTANEOUS | Status: DC
  Filled 2023-06-09: qty 57

## 2023-06-09 MED ORDER — DOCUSATE SODIUM 100 MG PO CAPS
100.0000 mg | ORAL_CAPSULE | Freq: Two times a day (BID) | ORAL | Status: DC
  Administered 2023-06-09 – 2023-06-13 (×8): 100 mg via ORAL
  Filled 2023-06-09 (×9): qty 1

## 2023-06-09 NOTE — Progress Notes (Signed)
   NAME:  Curtis Flores, MRN:  [id number], DOB:  11-Aug-1955, LOS: 3 ADMISSION DATE:  06/06/2023, CONSULTATION DATE:  06/07/2022 REFERRING MD: Joycelyn Das, MD, CHIEF COMPLAINT:  Worsening SOB for 2 days  History of Present Illness:  A 68 yr old male patient with Rh.A, ILD on Albuterol PRN (not on home O2), HTN, atrial flutter, chronic S-CHF with EF 35% and G1DD, who was Rx for LLL CAP twice in Feb (Levaquin for one week) and March (Levaquin for 2 weeks, finished 2 weeks ago). Yesterday, he presented to the ED with worsening SOB, DOE, chills, mild wheezing, and coughing tan sputum. Denied fever, rigors, CP, URTI symptoms, aspirations, GERD, rash, N/V/D, and abd pain. Has has chronic trace feet edema. Smoked tobacco but quit 20 yrs ago. In the ED, SpO2 90% on RA. Started on Vanco, Cefepime, and solumedrol. He had left thoracentesis today 250 cc amber colored fluid sent to w/u.  Pertinent  Medical History  Rh.A, ILD on Albuterol PRN (not home O2), HTN, atrial flutter, chronic S-CHF with EF 35% and G1DD  Significant Hospital Events: Including procedures, antibiotic start and stop dates in addition to other pertinent events   06/07/2023, left thoracentesis today 250 cc amber colored fluid sent to w/u 4/6: better, on 1 L [place], uses the flutter valve   Interim History / Subjective:   On 2L nasal cannula  Objective   Blood pressure 132/67, pulse 97, temperature (!) 97.4 F (36.3 C), temperature source Oral, resp. rate 18, SpO2 100%.        Intake/Output Summary (Last 24 hours) at 06/09/2023 1122 Last data filed at 06/09/2023 0516 Gross per 24 hour  Intake --  Output 900 ml  Net -900 ml   There were no vitals filed for this visit.  Examination: Gen:      No acute distress HEENT:  EOMI, sclera anicteric Neck:     No masses; no thyromegaly Lungs:    Clear to auscultation bilaterally; normal respiratory effort CV:         Regular rate and rhythm; no murmurs Abd:      + bowel sounds; soft, non-tender;  no palpable masses, no distension Ext:    No edema; adequate peripheral perfusion Skin:      Warm and dry; no rash Neuro: alert and oriented x 3  Lab/imaging reviewed Significant for BUN/creatinine 19/0.53 AST 50, ALT 62 WBC 16.8, hemoglobin 11.2, platelets 272 No new imaging   Resolved Hospital Problem list     Assessment & Plan:  Non resolving LLL CAP and acute hypoxia  Pleural effusion status postthoracentesis Continue cefepime, Zithromax Solu-Medrol IV 40 mg daily Urine strep pneumo is negative.  Legionella pending Pleural studies show exudative effusion.  Cultures are negative to date Cytology pending Incentive spirometer, nebulizers  06/04/2023, PFT: severe restriction, corrected Dlco is NL , no obstruction.    Rh.A related ILD, on Albuterol PRN (no home O2) -as above   Bilateral pleural effusions (left >right), s/p left thoracentesis, sent to w/u -f/u pleural effusion w/u: exudative   HTN, atrial flutter, chronic S-CHF (Nov 2024, Echo: EF 35% and G1DD) Repeat echocardiogram reviewed with EF 35 to 40% Continue IV diuresis  Signature:    Chilton Greathouse MD [facility] See Amion for pager  If no response to pager , please call [phone] until 7pm After 7:00 pm call Elink  [phone] 06/09/2023, 11:22 AM

## 2023-06-09 NOTE — Progress Notes (Addendum)
 PROGRESS NOTE  Curtis Flores ZOX:[id number] DOB: 1956-01-08 DOA: 06/06/2023 PCP: Ladora Daniel, PA-C   LOS: 3 days   Brief narrative:   Curtis Flores is a 68 y.o. male with medical history significant of hypertension, hyperlipidemia, pulmonary fibrosis, rheumatoid arthritis, atrial flutter, chronic HFrEF presented to the emergency department complaints of shortness of breath.  Of note patient was recently diagnosed with left lower lobe pneumonia on 04/19/2023 and had completed Levaquin course at that time.  He had been having persistent symptoms of shortness of breath subjective fever productive cough since then but then the symptoms got worse so he decided to come to the hospital.  In the ED, patient was noted to be tachypneic with mild bradycardia.  Pulse ox was 90% on room air.  WBC was 10.7.  Sodium was low at 128 with albumin low at 2.8.  AST 66, ALT 70, ALP 152, BNP 336 (this was 386 on 04/19/2023).  Influenza A, B, SARS, RSV was negative.  CT chest with contrast overall  stable appearance of the pleural effusions bilaterally left greater than right. Persistent fibrotic change and left lower lobe consolidation is seen and stable.  Chest x-ray showed redemonstration of left retrocardiac airspace opacity, likely combination of left lung atelectasis and/or consolidation with pleural effusion. Pulmonologist (Dr. Vassie Loll) was consulted and recommended broad-spectrum antibiotic, Solu-Medrol in pleural tapping.  He was treated with IV vancomycin and cefepime, IV Solu-Medrol was started.  Patient was then admitted hospital for further evaluation and treatment.   Assessment/Plan: Principal Problem:   HCAP (healthcare-associated pneumonia) Active Problems:   Pulmonary fibrosis (HCC)   Mixed hyperlipidemia   GERD (gastroesophageal reflux disease)   Essential hypertension   Bilateral pleural effusion   Hyponatremia   Transaminitis   Elevated brain natriuretic peptide (BNP) level   Prostate cancer  metastatic to bone (HCC)  HCAP in the setting of pulmonary fibrosis Continue  cefepime, azithromycin.  Vancomycin has been discontinued.  Blood cultures negative in 3 days.  Continue Solu-Medrol, Mucinex, incentive spirometry flutter valve.  Urinary strep pneumoniae antigen negative.  Continue nebulizers.   Bilateral pleural effusion (L > R) Status post thoracocentesis with left lung collapse.  Exudative fluid.  Patient will continue with incentive spirometry flutter valve.  Repeat chest x-ray with improved aeration.  Pulmonary following.  On cefepime and Zithromax.   Acute respiratory failure with hypoxia  related to pulmonary fibrosis and pneumonia.  Continue supplemental oxygen to maintain O2 sats > 92% p, continue nasal cannula oxygen currently at 2 L/min.   Hyponatremia Sodium level of 136 today from 128 on presentation.    Elevated LFTs. AST 66, ALT 70, ALP 152 on presentation..  No abdominal pain.  Right upper quadrant ultrasound without any gallstones or ductal dilatation.    Hypoalbuminemia  Albumin 2.8, encourage oral nutrition.  Dietary on board.  Continue insulin twice daily.   Metastatic prostate cancer CT chest with contrast was suggestive of extensive sclerotic osseous lesions highly suggestive of metastatic prostate carcinoma.  PSA was 0.7.  Might need urology to follow the patient as outpatient.   Essential hypertension Continue amlodipine, losartan.  Latest blood pressure of 137/69   Mixed hyperlipidemia Hold statins due to elevated LFT.  AST ALT of 50 followed by 62 today.   GERD Continue Protonix   DVT prophylaxis: SCDs Start: 06/07/23 0031 apixaban (ELIQUIS) tablet 5 mg   Disposition: Likely home with home health in 1 to 2 days when clinically improved  Status is: Inpatient  Remains  inpatient appropriate because: Pending clinical improvement, status post thoracocentesis, IV antibiotics, supplemental oxygen    Code Status:     Code Status: Full  Code  Family Communication:   Spoke with the spouse on the phone on 4/ 7/25  Consultants: PCCM  Procedures: Ultrasound-guided thoracocentesis on 06/07/2023 with removal of 50 mL of amber-colored fluid.  Anti-infectives:  Azithromycin and cefepime  Anti-infectives (From admission, onward)    Start     Dose/Rate Route Frequency Ordered Stop   06/07/23 1200  azithromycin (ZITHROMAX) 500 mg in sodium chloride 0.9 % 250 mL IVPB        500 mg 250 mL/hr over 60 Minutes Intravenous Every 24 hours 06/07/23 1109 06/10/23 1159   06/07/23 1000  vancomycin (VANCOREADY) IVPB 750 mg/150 mL  Status:  Discontinued       Placed in "Followed by" Linked Group   750 mg 150 mL/hr over 60 Minutes Intravenous Every 12 hours 06/06/23 2027 06/07/23 1231   06/07/23 0600  ceFEPIme (MAXIPIME) 2 g in sodium chloride 0.9 % 100 mL IVPB        2 g 200 mL/hr over 30 Minutes Intravenous Every 8 hours 06/07/23 0105     06/06/23 2030  ceFEPIme (MAXIPIME) 2 g in sodium chloride 0.9 % 100 mL IVPB        2 g 200 mL/hr over 30 Minutes Intravenous  Once 06/06/23 2020 06/06/23 2125   06/06/23 2030  vancomycin (VANCOREADY) IVPB 1500 mg/300 mL       Placed in "Followed by" Linked Group   1,500 mg 150 mL/hr over 120 Minutes Intravenous  Once 06/06/23 2027 06/06/23 2342       Subjective: Today, patient was seen and examined at bedside.  Patient still complains of some cough.  Denies any pain, nausea, vomiting, fever, chills or rigor.  Denies overt dyspnea   Objective: Vitals:   06/09/23 0834 06/09/23 0856  BP:  132/67  Pulse: 77 97  Resp: 18 18  Temp:  (!) 97.4 F (36.3 C)  SpO2: 99% 100%    Intake/Output Summary (Last 24 hours) at 06/09/2023 1116 Last data filed at 06/09/2023 0516 Gross per 24 hour  Intake --  Output 900 ml  Net -900 ml   There were no vitals filed for this visit. There is no height or weight on file to calculate BMI.   Physical Exam:  GENERAL: Patient is alert awake and oriented. Not  in obvious distress.  On nasal cannula oxygen. HENT: No scleral pallor or icterus. Pupils equally reactive to light. Oral mucosa is moist NECK: is supple, no gross swelling noted. CHEST: Diminished breath sounds bilaterally. CVS: S1 and S2 heard, no murmur. Regular rate and rhythm.  ABDOMEN: Soft, non-tender, bowel sounds are present. EXTREMITIES: No edema. CNS: Cranial nerves are intact. No focal motor deficits. SKIN: warm and dry without rashes.  Data Review: I have personally reviewed the following laboratory data and studies,  CBC: Recent Labs  Lab 06/06/23 1540 06/07/23 0250 06/08/23 0702 06/09/23 0442  WBC 10.7* 12.3* 15.3* 16.8*  NEUTROABS 7.6  --   --   --   HGB 12.6* 13.3 11.7* 11.2*  HCT 39.4 40.9 36.3* 35.1*  MCV 86.6 86.8 86.8 88.0  PLT 272 305 248 272   Basic Metabolic Panel: Recent Labs  Lab 06/06/23 1540 06/07/23 0250 06/08/23 0702 06/09/23 0442  NA 128* 133* 133* 136  K 5.1 4.6 4.7 5.1  CL 88* 95* 97* 100  CO2 29 26 27  33*  GLUCOSE 88 90 114* 99  BUN 12 9 17 19   CREATININE 0.54* 0.54* 0.49* 0.53*  CALCIUM 9.2 9.4 8.9 9.3  MG  --  2.1 2.3 2.4  PHOS  --  4.6  --   --    Liver Function Tests: Recent Labs  Lab 06/06/23 1540 06/07/23 0250 06/09/23 0442  AST 66* 61* 50*  ALT 70* 68* 62*  ALKPHOS 152* 165* 110  BILITOT 0.9 1.1 0.5  PROT 7.1 7.4 5.9*  ALBUMIN 2.8* 2.6* 2.1*   No results for input(s): "LIPASE", "AMYLASE" in the last 168 hours. No results for input(s): "AMMONIA" in the last 168 hours. Cardiac Enzymes: No results for input(s): "CKTOTAL", "CKMB", "CKMBINDEX", "TROPONINI" in the last 168 hours. BNP (last 3 results) Recent Labs    04/19/23 1130 06/06/23 1540  BNP 386.0* 336.0*    ProBNP (last 3 results) No results for input(s): "PROBNP" in the last 8760 hours.  CBG: No results for input(s): "GLUCAP" in the last 168 hours. Recent Results (from the past 240 hours)  Resp panel by RT-PCR (RSV, Flu A&B, Covid) Anterior Nasal  Swab     Status: None   Collection Time: 06/06/23  3:28 PM   Specimen: Anterior Nasal Swab  Result Value Ref Range Status   SARS Coronavirus 2 by RT PCR NEGATIVE NEGATIVE Final    Comment: (NOTE) SARS-CoV-2 target nucleic acids are NOT DETECTED.  The SARS-CoV-2 RNA is generally detectable in upper respiratory specimens during the acute phase of infection. The lowest concentration of SARS-CoV-2 viral copies this assay can detect is 138 copies/mL. A negative result does not preclude SARS-Cov-2 infection and should not be used as the sole basis for treatment or other patient management decisions. A negative result may occur with  improper specimen collection/handling, submission of specimen other than nasopharyngeal swab, presence of viral mutation(s) within the areas targeted by this assay, and inadequate number of viral copies(<138 copies/mL). A negative result must be combined with clinical observations, patient history, and epidemiological information. The expected result is Negative.  Fact Sheet for Patients:  BloggerCourse.com  Fact Sheet for Healthcare Providers:  SeriousBroker.it  This test is no t yet approved or cleared by the [place] FDA and  has been authorized for detection and/or diagnosis of SARS-CoV-2 by FDA under an Emergency Use Authorization (EUA). This EUA will remain  in effect (meaning this test can be used) for the duration of the COVID-19 declaration under Section 564(b)(1) of the Act, 21 U.S.C.section 360bbb-3(b)(1), unless the authorization is terminated  or revoked sooner.       Influenza A by PCR NEGATIVE NEGATIVE Final   Influenza B by PCR NEGATIVE NEGATIVE Final    Comment: (NOTE) The Xpert Xpress SARS-CoV-2/FLU/RSV plus assay is intended as an aid in the diagnosis of influenza from Nasopharyngeal swab specimens and should not be used as a sole basis for treatment. Nasal washings and aspirates  are unacceptable for Xpert Xpress SARS-CoV-2/FLU/RSV testing.  Fact Sheet for Patients: BloggerCourse.com  Fact Sheet for Healthcare Providers: SeriousBroker.it  This test is not yet approved or cleared by the [place] FDA and has been authorized for detection and/or diagnosis of SARS-CoV-2 by FDA under an Emergency Use Authorization (EUA). This EUA will remain in effect (meaning this test can be used) for the duration of the COVID-19 declaration under Section 564(b)(1) of the Act, 21 U.S.C. section 360bbb-3(b)(1), unless the authorization is terminated or revoked.     Resp Syncytial Virus by PCR NEGATIVE  NEGATIVE Final    Comment: (NOTE) Fact Sheet for Patients: BloggerCourse.com  Fact Sheet for Healthcare Providers: SeriousBroker.it  This test is not yet approved or cleared by the [place] FDA and has been authorized for detection and/or diagnosis of SARS-CoV-2 by FDA under an Emergency Use Authorization (EUA). This EUA will remain in effect (meaning this test can be used) for the duration of the COVID-19 declaration under Section 564(b)(1) of the Act, 21 U.S.C. section 360bbb-3(b)(1), unless the authorization is terminated or revoked.  Performed at [facility], [address] [place], [place] [address]   MRSA Next Gen by PCR, Nasal     Status: None   Collection Time: 06/06/23  8:23 PM   Specimen: Nasal Mucosa; Nasal Swab  Result Value Ref Range Status   MRSA by PCR Next Gen NOT DETECTED NOT DETECTED Final    Comment: (NOTE) The GeneXpert MRSA Assay (FDA approved for NASAL specimens only), is one component of a comprehensive MRSA colonization surveillance program. It is not intended to diagnose MRSA infection nor to guide or monitor treatment for MRSA infections. Test performance is not FDA approved in patients less than 1 years old. Performed at [facility], [address], [place], [place] [address]   Blood culture (routine x 2)     Status: None (Preliminary result)   Collection Time: 06/06/23  8:44 PM   Specimen: BLOOD RIGHT HAND  Result Value Ref Range Status   Specimen Description BLOOD RIGHT HAND  Final   Special Requests   Final    BOTTLES DRAWN AEROBIC AND ANAEROBIC Blood Culture adequate volume   Culture   Final    NO GROWTH 3 DAYS Performed at [facility], [address] [place], [place] [address]    Report Status PENDING  Incomplete  Blood culture (routine x 2)     Status: None (Preliminary result)   Collection Time: 06/06/23  8:51 PM   Specimen: Left Antecubital; Blood  Result Value Ref Range Status   Specimen Description LEFT ANTECUBITAL  Final   Special Requests   Final    BOTTLES DRAWN AEROBIC AND ANAEROBIC Blood Culture adequate volume   Culture   Final    NO GROWTH 3 DAYS Performed at [facility], [address] [place], [place] [address]    Report Status PENDING  Incomplete  Culture, blood (routine x 2) Call MD if unable to obtain prior to antibiotics being given     Status: None (Preliminary result)   Collection Time: 06/07/23  2:48 AM   Specimen: BLOOD RIGHT HAND  Result Value Ref Range Status   Specimen Description BLOOD RIGHT HAND  Final   Special Requests   Final    BOTTLES DRAWN AEROBIC AND ANAEROBIC Blood Culture results may not be optimal due to an inadequate volume of blood received in culture bottles   Culture   Final    NO GROWTH 2 DAYS Performed at [facility], [address] [place], [place] [address]    Report Status PENDING  Incomplete  Culture, blood (routine x 2) Call MD if unable to obtain prior to antibiotics being given     Status: None (Preliminary result)   Collection Time: 06/07/23  2:49 AM   Specimen: BLOOD  Result Value Ref Range Status   Specimen Description BLOOD LEFT ANTECUBITAL  Final   Special Requests   Final    BOTTLES DRAWN AEROBIC AND ANAEROBIC Blood Culture  adequate volume   Culture   Final  NO GROWTH 2 DAYS Performed at [facility], [address] [place], [place] [address]    Report Status PENDING  Incomplete     Studies: DG CHEST PORT 1 VIEW Result Date: 06/08/2023 CLINICAL DATA:  Lung collapse. EXAM: PORTABLE CHEST 1 VIEW COMPARISON:  June 07, 2023. FINDINGS: Stable cardiomediastinal silhouette. There is increased aeration of the left upper lobe compared to prior exam. There remains left lung opacity concerning for pneumonia or edema. Probable mild to moderate size left pleural effusion is noted. Right basilar atelectasis, edema or atelectasis is noted with small right pleural effusion. Bony thorax is unremarkable. IMPRESSION: Bilateral lung opacities as noted above. There does appear to be significantly increased aeration of left upper lobe compared to prior exam. Electronically Signed   By: Lupita Raider M.D.   On: 06/08/2023 08:11      Joycelyn Das, MD  [facility] 06/09/2023  If 7PM-7AM, please contact night-coverage

## 2023-06-09 NOTE — TOC CM/SW Note (Signed)
 Transition of Care [facility]) - Inpatient Brief Assessment   Patient Details  Name: Curtis Flores MRN: [id number] Date of Birth: April 28, 1955  [facility] CM/SW Contact:    Tom-Johnson, Hershal Coria, RN Phone Number: 06/09/2023, 4:41 PM   Clinical Narrative:  Patient presented to the ED with worsening Shortness Of Breath, Chills, Wheezing, and productive Coughing. Admitted with HCAP, on IV abx. On 3L O2, does not use home O2.  Underwent Lt Thoracentesis on 06/07/23 yielding 250 ml.  Patient was admitted in 04/2023 and 05/2023 for CAP.  As hx of A-Flutter, CHF with EF 35%,   From home with wife, has two supportive children. Wife and family transports to and from appointments. Has a cane at home.  PCP is Ladora Daniel, PA-C and uses [facility] [address] in [place].   No TOC needs or recommendations noted at this time.  Patient not Medically ready for discharge.  CM will continue to follow as patient progresses with care towards discharge.             Transition of Care Asessment: Insurance and Status: Insurance coverage has been reviewed Patient has primary care physician: Yes Home environment has been reviewed: Yes Prior level of function:: Modified Independent Prior/Current Home Services: No current home services Social Drivers of Health Review: SDOH reviewed no interventions necessary Readmission risk has been reviewed: Yes Transition of care needs: no transition of care needs at this time

## 2023-06-10 DIAGNOSIS — J189 Pneumonia, unspecified organism: Secondary | ICD-10-CM | POA: Diagnosis not present

## 2023-06-10 DIAGNOSIS — J90 Pleural effusion, not elsewhere classified: Secondary | ICD-10-CM | POA: Diagnosis not present

## 2023-06-10 LAB — CBC
HCT: 36 % — ABNORMAL LOW (ref 39.0–52.0)
Hemoglobin: 11.3 g/dL — ABNORMAL LOW (ref 13.0–17.0)
MCH: 27.5 pg (ref 26.0–34.0)
MCHC: 31.4 g/dL (ref 30.0–36.0)
MCV: 87.6 fL (ref 80.0–100.0)
Platelets: 267 10*3/uL (ref 150–400)
RBC: 4.11 MIL/uL — ABNORMAL LOW (ref 4.22–5.81)
RDW: 18.6 % — ABNORMAL HIGH (ref 11.5–15.5)
WBC: 13 10*3/uL — ABNORMAL HIGH (ref 4.0–10.5)
nRBC: 0.2 % (ref 0.0–0.2)

## 2023-06-10 LAB — BASIC METABOLIC PANEL WITH GFR
Anion gap: 7 (ref 5–15)
BUN: 16 mg/dL (ref 8–23)
CO2: 34 mmol/L — ABNORMAL HIGH (ref 22–32)
Calcium: 9.3 mg/dL (ref 8.9–10.3)
Chloride: 96 mmol/L — ABNORMAL LOW (ref 98–111)
Creatinine, Ser: 0.38 mg/dL — ABNORMAL LOW (ref 0.61–1.24)
GFR, Estimated: >60 mL/min (ref >60)
Glucose, Bld: 79 mg/dL (ref 70–99)
Potassium: 4.9 mmol/L (ref 3.5–5.1)
Sodium: 137 mmol/L (ref 135–145)

## 2023-06-10 LAB — LEGIONELLA PNEUMOPHILA SEROGP 1 UR AG: L. pneumophila Serogp 1 Ur Ag: NEGATIVE

## 2023-06-10 LAB — PHOSPHORUS: Phosphorus: 2.9 mg/dL (ref 2.5–4.6)

## 2023-06-10 LAB — MAGNESIUM: Magnesium: 2.4 mg/dL (ref 1.7–2.4)

## 2023-06-10 MED ORDER — FUROSEMIDE 40 MG PO TABS
40.0000 mg | ORAL_TABLET | Freq: Two times a day (BID) | ORAL | Status: DC
  Administered 2023-06-10 – 2023-06-13 (×6): 40 mg via ORAL
  Filled 2023-06-10 (×6): qty 1

## 2023-06-10 NOTE — Progress Notes (Signed)
 Heart Failure Navigator Progress Note  Assessed for Heart & Vascular TOC clinic readiness.  Patient does not meet criteria due to metastatic prostate cancer.   Navigator available for reassessment of patient.   Sharen Hones, PharmD, BCPS Heart Failure Stewardship Pharmacist Phone [phone]

## 2023-06-10 NOTE — Progress Notes (Signed)
 NAME:  Curtis Flores, MRN:  [id number], DOB:  June 15, 1955, LOS: 4 ADMISSION DATE:  06/06/2023, CONSULTATION DATE:  06/07/2022 REFERRING MD: Joycelyn Das, MD, CHIEF COMPLAINT:  Worsening SOB for 2 days  History of Present Illness:  A 68 yr old male patient with Rh.A, ILD on Albuterol PRN (not on home O2), HTN, atrial flutter, chronic S-CHF with EF 35% and G1DD, who was Rx for LLL CAP twice in Feb (Levaquin for one week) and March (Levaquin for 2 weeks, finished 2 weeks ago). Yesterday, he presented to the ED with worsening SOB, DOE, chills, mild wheezing, and coughing tan sputum. Denied fever, rigors, CP, URTI symptoms, aspirations, GERD, rash, N/V/D, and abd pain. Has has chronic trace feet edema. Smoked tobacco but quit 20 yrs ago. In the ED, SpO2 90% on RA. Started on Vanco, Cefepime, and solumedrol. He had left thoracentesis today 250 cc amber colored fluid sent to w/u.  Pertinent  Medical History  Rh.A, ILD on Albuterol PRN (not home O2), HTN, atrial flutter, chronic S-CHF with EF 35% and G1DD  Significant Hospital Events: Including procedures, antibiotic start and stop dates in addition to other pertinent events   06/07/2023, left thoracentesis today 250 cc amber colored fluid sent to w/u 4/6: better, on 1 L [place], uses the flutter valve   Interim History / Subjective:   On room air.  Did not desaturate on exertion  Objective   Blood pressure (!) 158/75, pulse 72, temperature 98.5 F (36.9 C), resp. rate 18, height 5' 6.5" (1.689 m), weight 71.9 kg, SpO2 98%.        Intake/Output Summary (Last 24 hours) at 06/10/2023 1225 Last data filed at 06/10/2023 1206 Gross per 24 hour  Intake 240 ml  Output 2630 ml  Net -2390 ml   Filed Weights   06/09/23 1421  Weight: 71.9 kg    Examination: Gen:      No acute distress HEENT:  EOMI, sclera anicteric Neck:     No masses; no thyromegaly Lungs:    Clear to auscultation bilaterally; normal respiratory effort CV:         Regular rate and rhythm;  no murmurs Abd:      + bowel sounds; soft, non-tender; no palpable masses, no distension Ext:    No edema; adequate peripheral perfusion Skin:      Warm and dry; no rash Neuro: alert and oriented x 3 Psych: normal mood and affect   Lab/imaging reviewed Labs are stable No new imaging   Resolved Hospital Problem list     Assessment & Plan:  LLL CAP and acute hypoxia  Pleural effusion status postthoracentesis Continue cefepime, Zithromax for 7 days Solu-Medrol IV 40 mg daily.  Can switch to p.o. prednisone 40 mg/day and taper by 10 mg every 2 days Urine strep pneumo is negative.  Legionella pending Pleural studies show exudative effusion.  Cultures are negative to date Follow cytology on pleural fluid which is still pending Incentive spirometer, nebulizers  06/04/2023, PFT: severe restriction, corrected Dlco is NL , no obstruction.    Rh.A related ILD, on Albuterol PRN (no home O2) -as above   Bilateral pleural effusions (left >right), s/p left thoracentesis, sent to w/u -f/u pleural effusion w/u: exudative   HTN, atrial flutter, chronic S-CHF (Nov 2024, Echo: EF 35% and G1DD) Repeat echocardiogram reviewed with EF 35 to 40% Continue IV diuresis  PCCM will sign off.  He has a follow-up appointment on 5/15 with Dr. Vassie Loll Please call with any  questions.  Signature:    Chilton Greathouse MD [facility] See Amion for pager  If no response to pager , please call [phone] until 7pm After 7:00 pm call Elink  [phone] 06/10/2023, 12:25 PM

## 2023-06-10 NOTE — Progress Notes (Signed)
 Tried to contact pt but message says call cannot be completed at this time.

## 2023-06-10 NOTE — Plan of Care (Signed)
   Problem: Respiratory: Goal: Ability to maintain adequate ventilation will improve Outcome: Progressing

## 2023-06-10 NOTE — Progress Notes (Signed)
 SATURATION QUALIFICATIONS: (This note is used to comply with regulatory documentation for home oxygen)  Patient Saturations on Room Air at Rest = 99-100%, O2 just removed  Patient Saturations on Room Air while Ambulating = 94-98%  Patient Saturations on 0 Liters of oxygen while Ambulating = 94-98%  Pt ambulated in hallway on RA, sats remained 94-98%, pt was able to walk briskly, denies SOB until got back into the room then c/o some mild SOB, sats 94%. Pt stated, " I think I walked too fast, I'm a little short of breath".  Pt up resting in chair on RA at this time, pt's primary nurse, Fleet Contras notified of the above.    Delvin Hedeen,RN SWOT

## 2023-06-10 NOTE — Progress Notes (Signed)
 PROGRESS NOTE  Curtis Flores:[id number] DOB: 21-Oct-1955 DOA: 06/06/2023 PCP: Ladora Daniel, PA-C   LOS: 4 days   Brief narrative:   Curtis Flores is a 68 y.o. 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  Of note, patient was recently diagnosed with left lower lobe pneumonia on 04/19/2023 and had completed Levaquin course at that time.  He had been having persistent symptoms of shortness of breath subjective fever productive cough since then but then the symptoms got worse so he decided to come to the hospital.  In the ED, patient was noted to be tachypneic with mild bradycardia.  Pulse ox was 90% on room air.  WBC was 10.7.  Sodium was low at 128 with albumin low at 2.8.  AST 66, ALT 70, ALP 152, BNP 336 (this was 386 on 04/19/2023).  Influenza A, B, SARS, RSV was negative.  CT chest with contrast overall  stable appearance of the pleural effusions bilaterally left greater than right. Persistent fibrotic change and left lower lobe consolidation is seen and stable.  Chest x-ray showed redemonstration of left retrocardiac airspace opacity, likely combination of left lung atelectasis and/or consolidation with pleural effusion. Pulmonology was consulted and recommended broad-spectrum antibiotic, Solu-Medrol and pleural tapping.  He was treated with IV vancomycin and cefepime, IV Solu-Medrol was started.  Patient was then admitted hospital for further evaluation and treatment.  During hospitalization, patient underwent pleural tapping by interventional radiology provide some collapse of the lung..  Slowly improving at this time with conservative treatment.  Still getting antibiotic..   Assessment/Plan: Principal Problem:   HCAP (healthcare-associated pneumonia) Active Problems:   Pulmonary fibrosis (HCC)   Mixed  hyperlipidemia   GERD (gastroesophageal reflux disease)   Essential hypertension   Bilateral pleural effusion   Hyponatremia   Transaminitis   Elevated brain natriuretic peptide (BNP) level   Prostate cancer metastatic to bone (HCC)  HCAP in the setting of rheumatoid arthritis related pulmonary fibrosis Continue  cefepime, azithromycin, plan for 7-day course..    Blood cultures negative in 4 days.  Continue Solu-Medrol, Mucinex, incentive spirometry, flutter valve.  On Solu-Medrol 40 daily will switch to 40 daily and taper 10 mg every 2 days.  Urinary strep pneumoniae antigen negative.  Continue nebulizers and supportive care..  Pulmonary function test 4-25 with severe restriction.  Will resume Lasix p.o. twice daily from home starting today.  Patient follows up with Dr. Vassie Loll pulmonary as outpatient.   Bilateral pleural effusion (L > R) Status post thoracocentesis with left lung collapse.  Exudative fluid.  Patient will continue with incentive spirometry, flutter valve.  Repeat chest x-ray with improved aeration.  Pulmonary following.  On cefepime and Zithromax.   Acute respiratory failure with hypoxia  related to pulmonary fibrosis and pneumonia.  Continue supplemental oxygen to maintain O2 sats > 92% p, continue nasal cannula oxygen currently at 2 L/min.  Will continue to wean off oxygen.   Hyponatremia Sodium level of 136 today from 128 on presentation.  Will resume p.o. Lasix  starting tonight..  Elevated LFTs. AST ALT has slightly improved.  No abdominal pain.  Right upper quadrant ultrasound without any gallstones or ductal dilatation.    Hypoalbuminemia  Albumin 2.8, encourage oral nutrition.  Dietary on board.  Continue Ensure.   Metastatic prostate cancer CT chest with contrast was suggestive of extensive sclerotic osseous lesions highly suggestive  of metastatic prostate carcinoma.  PSA was 0.7.  Might need urology to follow the patient as outpatient.   Essential  hypertension Continue amlodipine, losartan.  Latest blood pressure of 137/69   Mixed hyperlipidemia Hold statins due to elevated LFT.  Latest AST ALT of 50 followed by 62   GERD Continue Protonix   History of atrial flutter.  On Eliquis.  DVT prophylaxis: SCDs Start: 06/07/23 0031 apixaban (ELIQUIS) tablet 5 mg   Disposition: Likely home with home health in 1 to 2 days when clinically improved, will get PT evaluation.  Status is: Inpatient  Remains inpatient appropriate because: Pending clinical improvement, status post thoracocentesis, IV antibiotics, supplemental oxygen    Code Status:     Code Status: Full Code  Family Communication:   Spoke with the spouse on the phone on 4/ 7/25  Consultants: PCCM  Procedures: Ultrasound-guided thoracocentesis on 06/07/2023 with removal of 50 mL of amber-colored fluid.  Anti-infectives:  Azithromycin and cefepime IV  Anti-infectives (From admission, onward)    Start     Dose/Rate Route Frequency Ordered Stop   06/07/23 1200  azithromycin (ZITHROMAX) 500 mg in sodium chloride 0.9 % 250 mL IVPB        500 mg 250 mL/hr over 60 Minutes Intravenous Every 24 hours 06/07/23 1109 06/09/23 1438   06/07/23 1000  vancomycin (VANCOREADY) IVPB 750 mg/150 mL  Status:  Discontinued       Placed in "Followed by" Linked Group   750 mg 150 mL/hr over 60 Minutes Intravenous Every 12 hours 06/06/23 2027 06/07/23 1231   06/07/23 0600  ceFEPIme (MAXIPIME) 2 g in sodium chloride 0.9 % 100 mL IVPB        2 g 200 mL/hr over 30 Minutes Intravenous Every 8 hours 06/07/23 0105     06/06/23 2030  ceFEPIme (MAXIPIME) 2 g in sodium chloride 0.9 % 100 mL IVPB        2 g 200 mL/hr over 30 Minutes Intravenous  Once 06/06/23 2020 06/06/23 2125   06/06/23 2030  vancomycin (VANCOREADY) IVPB 1500 mg/300 mL       Placed in "Followed by" Linked Group   1,500 mg 150 mL/hr over 120 Minutes Intravenous  Once 06/06/23 2027 06/06/23 2342       Subjective: Today,  patient was seen and examined at bedside.  Patient complains of cough but denies any chest pain.  Has some shortness of breath on exertion.  Complains of trace lower extremity edema.  Denies any pain, nausea, vomiting, fever, chills or rigor.     Objective: Vitals:   06/10/23 0512 06/10/23 0810  BP: (!) 144/88 (!) 158/75  Pulse: 77 72  Resp: 16 18  Temp: 98.2 F (36.8 C) 98.5 F (36.9 C)  SpO2: 98% 98%    Intake/Output Summary (Last 24 hours) at 06/10/2023 1355 Last data filed at 06/10/2023 1206 Gross per 24 hour  Intake 240 ml  Output 2630 ml  Net -2390 ml   Filed Weights   06/09/23 1421  Weight: 71.9 kg   Body mass index is 25.2 kg/m.   Physical Exam:  GENERAL: Patient is alert awake and oriented. Not in obvious distress.  On nasal cannula oxygen. HENT: No scleral pallor or icterus. Pupils equally reactive to light. Oral mucosa is moist NECK: is supple, no gross swelling noted. CHEST: Diminished breath sounds bilaterally.  Coarse breath sounds noted. CVS: S1 and S2 heard, no murmur. Regular rate and rhythm.  ABDOMEN: Soft, non-tender, bowel sounds are  present. EXTREMITIES: Bilateral lower extremity trace edema. CNS: Cranial nerves are intact. No focal motor deficits. SKIN: warm and dry without rashes.  Data Review: I have personally reviewed the following laboratory data and studies,  CBC: Recent Labs  Lab 06/06/23 1540 06/07/23 0250 06/08/23 0702 06/09/23 0442 06/10/23 0441  WBC 10.7* 12.3* 15.3* 16.8* 13.0*  NEUTROABS 7.6  --   --   --   --   HGB 12.6* 13.3 11.7* 11.2* 11.3*  HCT 39.4 40.9 36.3* 35.1* 36.0*  MCV 86.6 86.8 86.8 88.0 87.6  PLT 272 305 248 272 267   Basic Metabolic Panel: Recent Labs  Lab 06/06/23 1540 06/07/23 0250 06/08/23 0702 06/09/23 0442 06/10/23 0441  NA 128* 133* 133* 136 137  K 5.1 4.6 4.7 5.1 4.9  CL 88* 95* 97* 100 96*  CO2 29 26 27  33* 34*  GLUCOSE 88 90 114* 99 79  BUN 12 9 17 19 16   CREATININE 0.54* 0.54* 0.49* 0.53*  0.38*  CALCIUM 9.2 9.4 8.9 9.3 9.3  MG  --  2.1 2.3 2.4 2.4  PHOS  --  4.6  --   --  2.9   Liver Function Tests: Recent Labs  Lab 06/06/23 1540 06/07/23 0250 06/09/23 0442  AST 66* 61* 50*  ALT 70* 68* 62*  ALKPHOS 152* 165* 110  BILITOT 0.9 1.1 0.5  PROT 7.1 7.4 5.9*  ALBUMIN 2.8* 2.6* 2.1*   No results for input(s): "LIPASE", "AMYLASE" in the last 168 hours. No results for input(s): "AMMONIA" in the last 168 hours. Cardiac Enzymes: No results for input(s): "CKTOTAL", "CKMB", "CKMBINDEX", "TROPONINI" in the last 168 hours. BNP (last 3 results) Recent Labs    04/19/23 1130 06/06/23 1540  BNP 386.0* 336.0*    ProBNP (last 3 results) No results for input(s): "PROBNP" in the last 8760 hours.  CBG: No results for input(s): "GLUCAP" in the last 168 hours. Recent Results (from the past 240 hours)  Resp panel by RT-PCR (RSV, Flu A&B, Covid) Anterior Nasal Swab     Status: None   Collection Time: 06/06/23  3:28 PM   Specimen: Anterior Nasal Swab  Result Value Ref Range Status   SARS Coronavirus 2 by RT PCR NEGATIVE NEGATIVE Final    Comment: (NOTE) SARS-CoV-2 target nucleic acids are NOT DETECTED.  The SARS-CoV-2 RNA is generally detectable in upper respiratory specimens during the acute phase of infection. The lowest concentration of SARS-CoV-2 viral copies this assay can detect is 138 copies/mL. A negative result does not preclude SARS-Cov-2 infection and should not be used as the sole basis for treatment or other patient management decisions. A negative result may occur with  improper specimen collection/handling, submission of specimen other than nasopharyngeal swab, presence of viral mutation(s) within the areas targeted by this assay, and inadequate number of viral copies(<138 copies/mL). A negative result must be combined with clinical observations, patient history, and epidemiological information. The expected result is Negative.  Fact Sheet for Patients:   BloggerCourse.com  Fact Sheet for Healthcare Providers:  SeriousBroker.it  This test is no t yet approved or cleared by the [place] FDA and  has been authorized for detection and/or diagnosis of SARS-CoV-2 by FDA under an Emergency Use Authorization (EUA). This EUA will remain  in effect (meaning this test can be used) for the duration of the COVID-19 declaration under Section 564(b)(1) of the Act, 21 U.S.C.section 360bbb-3(b)(1), unless the authorization is terminated  or revoked sooner.       Influenza  A by PCR NEGATIVE NEGATIVE Final   Influenza B by PCR NEGATIVE NEGATIVE Final    Comment: (NOTE) The Xpert Xpress SARS-CoV-2/FLU/RSV plus assay is intended as an aid in the diagnosis of influenza from Nasopharyngeal swab specimens and should not be used as a sole basis for treatment. Nasal washings and aspirates are unacceptable for Xpert Xpress SARS-CoV-2/FLU/RSV testing.  Fact Sheet for Patients: BloggerCourse.com  Fact Sheet for Healthcare Providers: SeriousBroker.it  This test is not yet approved or cleared by the [place] FDA and has been authorized for detection and/or diagnosis of SARS-CoV-2 by FDA under an Emergency Use Authorization (EUA). This EUA will remain in effect (meaning this test can be used) for the duration of the COVID-19 declaration under Section 564(b)(1) of the Act, 21 U.S.C. section 360bbb-3(b)(1), unless the authorization is terminated or revoked.     Resp Syncytial Virus by PCR NEGATIVE NEGATIVE Final    Comment: (NOTE) Fact Sheet for Patients: BloggerCourse.com  Fact Sheet for Healthcare Providers: SeriousBroker.it  This test is not yet approved or cleared by the [place] FDA and has been authorized for detection and/or diagnosis of SARS-CoV-2 by FDA under an Emergency Use  Authorization (EUA). This EUA will remain in effect (meaning this test can be used) for the duration of the COVID-19 declaration under Section 564(b)(1) of the Act, 21 U.S.C. section 360bbb-3(b)(1), unless the authorization is terminated or revoked.  Performed at  [facility], [address] [place], [place] [address]   MRSA Next Gen by PCR, Nasal     Status: None   Collection Time: 06/06/23  8:23 PM   Specimen: Nasal Mucosa; Nasal Swab  Result Value Ref Range Status   MRSA by PCR Next Gen NOT DETECTED NOT DETECTED Final    Comment: (NOTE) The GeneXpert MRSA Assay (FDA approved for NASAL specimens only), is one component of a comprehensive MRSA colonization surveillance program. It is not intended to diagnose MRSA infection nor to guide or monitor treatment for MRSA infections. Test performance is not FDA approved in patients less than 57 years old. Performed at [facility], [address] [place], [place] [address]   Blood culture (routine x 2)     Status: None (Preliminary result)   Collection Time: 06/06/23  8:44 PM   Specimen: BLOOD RIGHT HAND  Result Value Ref Range Status   Specimen Description BLOOD RIGHT HAND  Final   Special Requests   Final    BOTTLES DRAWN AEROBIC AND ANAEROBIC Blood Culture adequate volume   Culture   Final    NO GROWTH 4 DAYS Performed at [facility], [address], [place], [place] [address]    Report Status PENDING  Incomplete  Blood culture (routine x 2)     Status: None (Preliminary result)   Collection Time: 06/06/23  8:51 PM   Specimen: Left Antecubital; Blood  Result Value Ref Range Status   Specimen Description LEFT ANTECUBITAL  Final   Special Requests   Final    BOTTLES DRAWN AEROBIC AND ANAEROBIC Blood Culture adequate volume   Culture   Final    NO GROWTH 4 DAYS Performed at [facility], [address] [place], [place] [address]    Report Status PENDING  Incomplete  Culture, blood (routine x 2) Call MD if unable to obtain prior  to antibiotics being given     Status: None (Preliminary result)   Collection Time: 06/07/23  2:48 AM   Specimen: BLOOD RIGHT HAND  Result Value Ref Range Status  Specimen Description BLOOD RIGHT HAND  Final   Special Requests   Final    BOTTLES DRAWN AEROBIC AND ANAEROBIC Blood Culture results may not be optimal due to an inadequate volume of blood received in culture bottles   Culture   Final    NO GROWTH 3 DAYS Performed at [facility], [address], [place], [place] [address]    Report Status PENDING  Incomplete  Culture, blood (routine x 2) Call MD if unable to obtain prior to antibiotics being given     Status: None (Preliminary result)   Collection Time: 06/07/23  2:49 AM   Specimen: BLOOD  Result Value Ref Range Status   Specimen Description BLOOD LEFT ANTECUBITAL  Final   Special Requests   Final    BOTTLES DRAWN AEROBIC AND ANAEROBIC Blood Culture adequate volume   Culture   Final    NO GROWTH 3 DAYS Performed at [facility], [address] [place], [place] [address]    Report Status PENDING  Incomplete     Studies: No results found.     Joycelyn Das, MD  [facility] 06/10/2023  If 7PM-7AM, please contact night-coverage

## 2023-06-11 DIAGNOSIS — J189 Pneumonia, unspecified organism: Secondary | ICD-10-CM | POA: Diagnosis not present

## 2023-06-11 LAB — CULTURE, BLOOD (ROUTINE X 2)
Culture: NO GROWTH
Culture: NO GROWTH
Special Requests: ADEQUATE
Special Requests: ADEQUATE

## 2023-06-11 LAB — CYTOLOGY - NON PAP

## 2023-06-11 MED ORDER — PREDNISONE 20 MG PO TABS
40.0000 mg | ORAL_TABLET | Freq: Every day | ORAL | Status: DC
  Administered 2023-06-12 – 2023-06-13 (×2): 40 mg via ORAL
  Filled 2023-06-11 (×2): qty 2

## 2023-06-11 NOTE — Progress Notes (Signed)
 PROGRESS NOTE  [name]:[id number] DOB: 16-Jan-1956 DOA: 06/06/2023 PCP: Ladora Daniel, PA-C   LOS: 5 days   Brief narrative:   Curtis Flores is a 68 y.o. 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  Of note, patient was recently diagnosed with left lower lobe pneumonia on 04/19/2023 and had completed Levaquin course at that time.  He had been having persistent symptoms of shortness of breath subjective fever productive cough since then but then the symptoms got worse so he decided to come to the hospital.  In the ED, patient was noted to be tachypneic with mild bradycardia.  Pulse ox was 90% on room air.  WBC was 10.7.  Sodium was low at 128 with albumin low at 2.8.  AST 66, ALT 70, ALP 152, BNP 336 (this was 386 on 04/19/2023).  Influenza A, B, SARS, RSV was negative.  CT chest with contrast overall  stable appearance of the pleural effusions bilaterally left greater than right. Persistent fibrotic change and left lower lobe consolidation is seen and stable.  Chest x-ray showed redemonstration of left retrocardiac airspace opacity, likely combination of left lung atelectasis and/or consolidation with pleural effusion. Pulmonology was consulted and recommended broad-spectrum antibiotic, Solu-Medrol and pleural tapping.  He was treated with IV vancomycin and cefepime, IV Solu-Medrol was started.  Patient was then admitted hospital for further evaluation and treatment.  During hospitalization, patient underwent pleural tapping by interventional radiology and had collapse of the lung.  Slowly improving at this time with conservative treatment.  Still getting antibiotic.   Assessment/Plan: Principal Problem:   HCAP (healthcare-associated pneumonia) Active Problems:   Pulmonary fibrosis (HCC)   Mixed hyperlipidemia    GERD (gastroesophageal reflux disease)   Essential hypertension   Bilateral pleural effusion   Hyponatremia   Transaminitis   Elevated brain natriuretic peptide (BNP) level   Prostate cancer metastatic to bone (HCC)  HCAP in the setting of rheumatoid arthritis related pulmonary fibrosis Continue  cefepime, azithromycin, plan for 7-day course..    Blood cultures negative in 4 days.  Continue , Mucinex, incentive spirometry, flutter valve.  On Solu-Medrol 40mg  daily will switch to prednisone 40 mg daily and taper 10 mg every 2 days.  Urinary strep pneumoniae antigen negative.  Continue nebulizers and supportive care..  Pulmonary function test 06-04-23 with severe restriction.  Will resume Lasix p.o. twice daily from home starting today.  Patient follows up with Dr. Vassie Loll pulmonary as outpatient.   Bilateral pleural effusion (L > R) Status post thoracocentesis with left lung collapse.  Exudative fluid.  Patient will continue with incentive spirometry, flutter valve.  Repeat chest x-ray with improved aeration.  Pulmonary following.  On cefepime and Zithromax.   Acute respiratory failure with hypoxia  related to pulmonary fibrosis and pneumonia.  Continue supplemental oxygen to maintain O2 sats > 92% p, continue nasal cannula oxygen currently at 2 L/min.  Will continue to wean off oxygen.   Hyponatremia Sodium level of 136 today from 128 on presentation.  Will resume p.o. Lasix  starting tonight..  Elevated LFTs. AST ALT has slightly improved.  No abdominal pain.  Right upper quadrant ultrasound without any gallstones or ductal dilatation.    Hypoalbuminemia  Albumin 2.8, encourage oral nutrition.  Dietary on board.  Continue Ensure.   Metastatic cancer likely lung cancer primary. CT chest with contrast was suggestive of extensive  sclerotic osseous lesions highly suggestive of metastatic prostate carcinoma.  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 Spoke with pulmonary and discussed about it.   Dr. Isaiah Serge recommended oncology evaluation.  I did reach out to the patient's spouse and spoke about it she wishes to talk to the patient first before consulting oncology.  PSA was 0.7.  Will get CT scan of the abdomen and pelvis as well.   Essential hypertension Continue amlodipine, losartan.  Blood pressure slightly elevated this morning.   Mixed hyperlipidemia Hold statins due to elevated LFT.  Latest AST ALT of 50 followed by 62   GERD Continue Protonix   History of atrial flutter.  On Eliquis.  DVT prophylaxis: SCDs Start: 06/07/23 0031 apixaban (ELIQUIS) tablet 5 mg   Disposition: Likely home with home in 1 to 2 days.  Status is: Inpatient  Remains inpatient appropriate because: Pending clinical improvement, status post thoracocentesis, IV antibiotics, supplemental oxygen    Code Status:     Code Status: Full Code  Family Communication:   Spoke with the spouse on the phone on 4/ 9/25 completed HAART about the cytology report.  Consultants: PCCM  Procedures: Ultrasound-guided thoracocentesis on 06/07/2023 with removal of 50 mL of amber-colored fluid.  Anti-infectives:  Azithromycin -completed cefepime IV  Anti-infectives (From admission, onward)    Start     Dose/Rate Route Frequency Ordered Stop   06/07/23 1200  azithromycin (ZITHROMAX) 500 mg in sodium chloride 0.9 % 250 mL IVPB        500 mg 250 mL/hr over 60 Minutes Intravenous Every 24 hours 06/07/23 1109 06/09/23 1438   06/07/23 1000  vancomycin (VANCOREADY) IVPB 750 mg/150 mL  Status:  Discontinued       Placed in "Followed by" Linked Group   750 mg 150 mL/hr over 60 Minutes Intravenous Every 12 hours 06/06/23 2027 06/07/23 1231   06/07/23 0600  ceFEPIme (MAXIPIME) 2 g in sodium chloride 0.9 % 100 mL IVPB        2 g 200 mL/hr over 30 Minutes Intravenous Every 8 hours 06/07/23 0105     06/06/23 2030  ceFEPIme (MAXIPIME) 2 g in sodium chloride 0.9 % 100 mL IVPB        2 g 200 mL/hr over 30 Minutes  Intravenous  Once 06/06/23 2020 06/06/23 2125   06/06/23 2030  vancomycin (VANCOREADY) IVPB 1500 mg/300 mL       Placed in "Followed by" Linked Group   1,500 mg 150 mL/hr over 120 Minutes Intravenous  Once 06/06/23 2027 06/06/23 2342       Subjective: Today, patient was seen and examined at bedside.  Patient states that he feels okay.  Denies any nausea, vomiting, fever, chills or rigor.  Has been having some cough with some sputum production yesterday but no dyspnea.    Objective: Vitals:   06/11/23 0837 06/11/23 1215  BP: 122/77 (!) 149/125  Pulse: 73   Resp: 18   Temp: 98.2 F (36.8 C) 97.7 F (36.5 C)  SpO2: 99%     Intake/Output Summary (Last 24 hours) at 06/11/2023 1310 Last data filed at 06/11/2023 0921 Gross per 24 hour  Intake 560 ml  Output 2050 ml  Net -1490 ml   Filed Weights   06/09/23 1421  Weight: 71.9 kg   Body mass index is 25.2 kg/m.   Physical Exam:  GENERAL: Patient is alert awake and oriented. Not in obvious distress.  On nasal cannula oxygen. HENT: No scleral pallor or icterus. Pupils  equally reactive to light. Oral mucosa is moist NECK: is supple, no gross swelling noted. CHEST: Diminished breath sounds bilaterally.  Coarse breath sounds noted. CVS: S1 and S2 heard, no murmur. Regular rate and rhythm.  ABDOMEN: Soft, non-tender, bowel sounds are present. EXTREMITIES: Bilateral lower extremity trace edema. CNS: Cranial nerves are intact. No focal motor deficits. SKIN: warm and dry without rashes.  Data Review: I have personally reviewed the following laboratory data and studies,  CBC: Recent Labs  Lab 06/06/23 1540 06/07/23 0250 06/08/23 0702 06/09/23 0442 06/10/23 0441  WBC 10.7* 12.3* 15.3* 16.8* 13.0*  NEUTROABS 7.6  --   --   --   --   HGB 12.6* 13.3 11.7* 11.2* 11.3*  HCT 39.4 40.9 36.3* 35.1* 36.0*  MCV 86.6 86.8 86.8 88.0 87.6  PLT 272 305 248 272 267   Basic Metabolic Panel: Recent Labs  Lab 06/06/23 1540 06/07/23 0250  06/08/23 0702 06/09/23 0442 06/10/23 0441  NA 128* 133* 133* 136 137  K 5.1 4.6 4.7 5.1 4.9  CL 88* 95* 97* 100 96*  CO2 29 26 27  33* 34*  GLUCOSE 88 90 114* 99 79  BUN 12 9 17 19 16   CREATININE 0.54* 0.54* 0.49* 0.53* 0.38*  CALCIUM 9.2 9.4 8.9 9.3 9.3  MG  --  2.1 2.3 2.4 2.4  PHOS  --  4.6  --   --  2.9   Liver Function Tests: Recent Labs  Lab 06/06/23 1540 06/07/23 0250 06/09/23 0442  AST 66* 61* 50*  ALT 70* 68* 62*  ALKPHOS 152* 165* 110  BILITOT 0.9 1.1 0.5  PROT 7.1 7.4 5.9*  ALBUMIN 2.8* 2.6* 2.1*   No results for input(s): "LIPASE", "AMYLASE" in the last 168 hours. No results for input(s): "AMMONIA" in the last 168 hours. Cardiac Enzymes: No results for input(s): "CKTOTAL", "CKMB", "CKMBINDEX", "TROPONINI" in the last 168 hours. BNP (last 3 results) Recent Labs    04/19/23 1130 06/06/23 1540  BNP 386.0* 336.0*    ProBNP (last 3 results) No results for input(s): "PROBNP" in the last 8760 hours.  CBG: No results for input(s): "GLUCAP" in the last 168 hours. Recent Results (from the past 240 hours)  Resp panel by RT-PCR (RSV, Flu A&B, Covid) Anterior Nasal Swab     Status: None   Collection Time: 06/06/23  3:28 PM   Specimen: Anterior Nasal Swab  Result Value Ref Range Status   SARS Coronavirus 2 by RT PCR NEGATIVE NEGATIVE Final    Comment: (NOTE) SARS-CoV-2 target nucleic acids are NOT DETECTED.  The SARS-CoV-2 RNA is generally detectable in upper respiratory specimens during the acute phase of infection. The lowest concentration of SARS-CoV-2 viral copies this assay can detect is 138 copies/mL. A negative result does not preclude SARS-Cov-2 infection and should not be used as the sole basis for treatment or other patient management decisions. A negative result may occur with  improper specimen collection/handling, submission of specimen other than nasopharyngeal swab, presence of viral mutation(s) within the areas targeted by this assay, and  inadequate number of viral copies(<138 copies/mL). A negative result must be combined with clinical observations, patient history, and epidemiological information. The expected result is Negative.  Fact Sheet for Patients:  BloggerCourse.com  Fact Sheet for Healthcare Providers:  SeriousBroker.it  This test is no t yet approved or cleared by the [place] FDA and  has been authorized for detection and/or diagnosis of SARS-CoV-2 by FDA under an Emergency Use Authorization (EUA). This EUA  will remain  in effect (meaning this test can be used) for the duration of the COVID-19 declaration under Section 564(b)(1) of the Act, 21 U.S.C.section 360bbb-3(b)(1), unless the authorization is terminated  or revoked sooner.       Influenza A by PCR NEGATIVE NEGATIVE Final   Influenza B by PCR NEGATIVE NEGATIVE Final    Comment: (NOTE) The Xpert Xpress SARS-CoV-2/FLU/RSV plus assay is intended as an aid in the diagnosis of influenza from Nasopharyngeal swab specimens and should not be used as a sole basis for treatment. Nasal washings and aspirates are unacceptable for Xpert Xpress SARS-CoV-2/FLU/RSV testing.  Fact Sheet for Patients: BloggerCourse.com  Fact Sheet for Healthcare Providers: SeriousBroker.it  This test is not yet approved or cleared by the [place] FDA and has been authorized for detection and/or diagnosis of SARS-CoV-2 by FDA under an Emergency Use Authorization (EUA). This EUA will remain in effect (meaning this test can be used) for the duration of the COVID-19 declaration under Section 564(b)(1) of the Act, 21 U.S.C. section 360bbb-3(b)(1), unless the authorization is terminated or revoked.     Resp Syncytial Virus by PCR NEGATIVE NEGATIVE Final    Comment: (NOTE) Fact Sheet for Patients: BloggerCourse.com  Fact Sheet for Healthcare  Providers: SeriousBroker.it  This test is not yet approved or cleared by the [place] FDA and has been authorized for detection and/or diagnosis of SARS-CoV-2 by FDA under an Emergency Use Authorization (EUA). This EUA will remain in effect (meaning this test can be used) for the duration of the COVID-19 declaration under Section 564(b)(1) of the Act, 21 U.S.C. section 360bbb-3(b)(1), unless the authorization is terminated or revoked.  Performed at [facility], [address] [place], [place] [address]   MRSA Next Gen by PCR, Nasal     Status: None   Collection Time: 06/06/23  8:23 PM   Specimen: Nasal Mucosa; Nasal Swab  Result Value Ref Range Status   MRSA by PCR Next Gen NOT DETECTED NOT DETECTED Final    Comment: (NOTE) The GeneXpert MRSA Assay (FDA approved for NASAL specimens only), is one component of a comprehensive MRSA colonization surveillance program. It is not intended to diagnose MRSA infection nor to guide or monitor treatment for MRSA infections. Test performance is not FDA approved in patients less than 6 years old. Performed at [facility], [address] [place], [place] [address]   Blood culture (routine x 2)     Status: None   Collection Time: 06/06/23  8:44 PM   Specimen: BLOOD RIGHT HAND  Result Value Ref Range Status   Specimen Description BLOOD RIGHT HAND  Final   Special Requests   Final    BOTTLES DRAWN AEROBIC AND ANAEROBIC Blood Culture adequate volume   Culture   Final    NO GROWTH 5 DAYS Performed at [facility], [address], [place], [place] [address]    Report Status 06/11/2023 FINAL  Final  Blood culture (routine x 2)     Status: None   Collection Time: 06/06/23  8:51 PM   Specimen: Left Antecubital; Blood  Result Value Ref Range Status   Specimen Description LEFT ANTECUBITAL  Final   Special Requests   Final    BOTTLES DRAWN AEROBIC AND ANAEROBIC Blood Culture adequate volume   Culture   Final    NO  GROWTH 5 DAYS Performed at [facility], [address] [place], [place] [address]    Report Status 06/11/2023 FINAL  Final  Culture, blood (routine  x 2) Call MD if unable to obtain prior to antibiotics being given     Status: None (Preliminary result)   Collection Time: 06/07/23  2:48 AM   Specimen: BLOOD RIGHT HAND  Result Value Ref Range Status   Specimen Description BLOOD RIGHT HAND  Final   Special Requests   Final    BOTTLES DRAWN AEROBIC AND ANAEROBIC Blood Culture results may not be optimal due to an inadequate volume of blood received in culture bottles   Culture   Final    NO GROWTH 4 DAYS Performed at [facility], [address], [place], [place] [address]    Report Status PENDING  Incomplete  Culture, blood (routine x 2) Call MD if unable to obtain prior to antibiotics being given     Status: None (Preliminary result)   Collection Time: 06/07/23  2:49 AM   Specimen: BLOOD  Result Value Ref Range Status   Specimen Description BLOOD LEFT ANTECUBITAL  Final   Special Requests   Final    BOTTLES DRAWN AEROBIC AND ANAEROBIC Blood Culture adequate volume   Culture   Final    NO GROWTH 4 DAYS Performed at [facility], [address] [place], [place] [address]    Report Status PENDING  Incomplete     Studies: No results found.     Joycelyn Das, MD  [facility] 06/11/2023  If 7PM-7AM, please contact night-coverage

## 2023-06-11 NOTE — Care Management Important Message (Signed)
 Important Message  Patient Details  Name: Curtis Flores MRN: [id number] Date of Birth: Jul 05, 1955   Important Message Given:  Yes - Medicare IM     Dorena Bodo 06/11/2023, 11:20 AM

## 2023-06-11 NOTE — Progress Notes (Signed)
 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 Tyson Babinski.  Recommend oncology consult Will follow-up in pulmonary clinic as scheduled.  Chilton Greathouse MD [facility] See Amion for pager  If no response to pager , please call [phone] until 7pm After 7:00 pm call Elink  [phone] 06/11/2023, 2:06 PM

## 2023-06-11 NOTE — Progress Notes (Signed)
 SATURATION QUALIFICATIONS: (This note is used to comply with regulatory documentation for home oxygen)  Patient Saturations on Room Air at Rest = >93%  Patient Saturations on Room Air while Ambulating = >90%  Patient Saturations on 0 Liters of oxygen while Ambulating = >90%  Please briefly explain why patient needs home oxygen: Pt doesn't require supplemental oxygen at home.    [facility], SPT

## 2023-06-11 NOTE — Evaluation (Signed)
 Physical Therapy Evaluation Patient Details Name: Curtis Flores MRN: [id number] DOB: August 09, 1955 Today's Date: 06/11/2023  History of Present Illness  Pt is a 68 y.o. male presenting 06/06/23 with SOB, fever, chills, and a productive cough. Admitted with HCAP. S/p thoracocentesis with left lung collapse on 06/07/23. PMH HTN, HLD,  pulmonary fibrosis, Atrial flutter,  chronic HFrEF, hyponatremia, elevated BNP, RA, hx of prostate cancer  Clinical Impression  Pt is a 68 y.o. male presenting on 06/06/23 with above conditions and deficits below, see PT Problem List. PTA pt independently lived in an one story home, had a level entry, and drove. Currently he is CGA for transfers and CGA-min A for ambulation and demonstrates a good prognosis to return to his independent functional baseline. Pt displayed deficits in activity tolerance and dynamic balance and would benefit from continued acute PT. Given the pt's PLOF, motivation to improve, available family support, current condition, and anticipated prognosis, no follow-up PT is recommended. If pt desires further PT upon d/c, additional OPPT can be explored. Pt would benefit from continued mobility with nurses and mobility specialists until d/c. Will continue to follow acutely.    SpO2 Readings: O2 at rest on RA: >93% O2 with activity on RA: >91%         If plan is discharge home, recommend the following: A little help with walking and/or transfers;Assistance with cooking/housework;Assist for transportation   Can travel by private vehicle        Equipment Recommendations None recommended by PT  Recommendations for Other Services       Functional Status Assessment Patient has had a recent decline in their functional status and demonstrates the ability to make significant improvements in function in a reasonable and predictable amount of time.     Precautions / Restrictions Precautions Precautions: Fall Recall of Precautions/Restrictions:  Intact Restrictions Weight Bearing Restrictions Per Provider Order: No      Mobility  Bed Mobility               General bed mobility comments: pt in recliner upon entry and EOS    Transfers Overall transfer level: Needs assistance Equipment used: None Transfers: Sit to/from Stand Sit to Stand: Contact guard assist           General transfer comment: pt sits from edge of recliner and use bil hands on armrest.    Ambulation/Gait Ambulation/Gait assistance: Contact guard assist, Min assist Gait Distance (Feet): 120 Feet Assistive device: None Gait Pattern/deviations: Step-through pattern, Decreased stride length, Leaning posteriorly, Wide base of support Gait velocity: reduced Gait velocity interpretation: >2.62 ft/sec, indicative of community ambulatory   General Gait Details: pt walks with increased trunk ext and intermittently reaches out to grab the wall railings for additional support as he fatigues. As he fatigues he increases left and right drifting. Required min A to limit lateral drift during slow walking.  Stairs            Wheelchair Mobility     Tilt Bed    Modified Rankin (Stroke Patients Only)       Balance Overall balance assessment: Needs assistance Sitting-balance support: Single extremity supported, No upper extremity supported, Feet supported Sitting balance-Leahy Scale: Normal Sitting balance - Comments: pt sits without LOB or lean   Standing balance support: No upper extremity supported, During functional activity Standing balance-Leahy Scale: Good Standing balance comment: pt stands with slightly increased BOS. No LOB  Pertinent Vitals/Pain Pain Assessment Pain Assessment: Faces Faces Pain Scale: Hurts a little bit Pain Location: generalized weakness Pain Descriptors / Indicators: Grimacing Pain Intervention(s): Monitored during session    Home Living Family/patient expects to be  discharged to:: Private residence Living Arrangements: Spouse/significant other;Children Available Help at Discharge: Family;Available 24 hours/day Type of Home: House Home Access: Level entry       Home Layout: One level Home Equipment: None Additional Comments: pt reports his spouse can provide 24/7 care    Prior Function Prior Level of Function : Independent/Modified Independent;Driving             Mobility Comments: independent ADLs Comments: independent, retired. still drives     Extremity/Trunk Assessment   Upper Extremity Assessment Upper Extremity Assessment: Overall WFL for tasks assessed    Lower Extremity Assessment Lower Extremity Assessment: Overall WFL for tasks assessed (pt reports no change of sensation, has WFL bil LE ROM, no pain,)    Cervical / Trunk Assessment Cervical / Trunk Assessment: Normal  Communication   Communication Communication: Impaired Factors Affecting Communication: Other (comment) (pt reports he has macular degeneration and his eye sight has been decreasing recently)    Cognition Arousal: Alert Behavior During Therapy: WFL for tasks assessed/performed   PT - Cognitive impairments: No apparent impairments                         Following commands: Intact       Cueing Cueing Techniques: Verbal cues     General Comments General comments (skin integrity, edema, etc.): See PT comments for SpO2 readings    Exercises     Assessment/Plan    PT Assessment Patient needs continued PT services  PT Problem List Decreased activity tolerance;Decreased balance;Decreased mobility;Decreased coordination;Decreased knowledge of use of DME;Cardiopulmonary status limiting activity       PT Treatment Interventions DME instruction;Gait training;Stair training;Functional mobility training;Therapeutic activities;Therapeutic exercise;Balance training;Neuromuscular re-education;Patient/family education;Manual techniques;Modalities     PT Goals (Current goals can be found in the Care Plan section)  Acute Rehab PT Goals Patient Stated Goal: return home PT Goal Formulation: With patient Time For Goal Achievement: 06/25/23 Potential to Achieve Goals: Good    Frequency Min 1X/week     Co-evaluation               AM-PAC PT "6 Clicks" Mobility  Outcome Measure Help needed turning from your back to your side while in a flat bed without using bedrails?: None Help needed moving from lying on your back to sitting on the side of a flat bed without using bedrails?: None Help needed moving to and from a bed to a chair (including a wheelchair)?: A Little Help needed standing up from a chair using your arms (e.g., wheelchair or bedside chair)?: A Little Help needed to walk in hospital room?: A Little Help needed climbing 3-5 steps with a railing? : A Little 6 Click Score: 20    End of Session Equipment Utilized During Treatment: Gait belt;Oxygen Activity Tolerance: Patient limited by fatigue Patient left: in chair;with call bell/phone within reach Nurse Communication: Mobility status;Other (comment) (nurse reports pt doesn't require an active chair alarm) PT Visit Diagnosis: Other abnormalities of gait and mobility (R26.89);Other (comment) (decreased cardiopulmonary endurance)    Time: 1610-9604 PT Time Calculation (min) (ACUTE ONLY): 23 min   Charges:   PT Evaluation $PT Eval Moderate Complexity: 1 Mod PT Treatments $Gait Training: 8-22 mins PT General Charges $$ ACUTE PT VISIT:  1 Visit         [facility] [place]   Bolton Muhamed Luecke 06/11/2023, 11:49 AM

## 2023-06-11 NOTE — Plan of Care (Signed)
  Problem: Activity: Goal: Ability to tolerate increased activity will improve Outcome: Progressing   Problem: Clinical Measurements: Goal: Ability to maintain a body temperature in the normal range will improve Outcome: Progressing   Problem: Education: Goal: Knowledge of General Education information will improve Description Including pain rating scale, medication(s)/side effects and non-pharmacologic comfort measures Outcome: Progressing

## 2023-06-11 NOTE — Progress Notes (Signed)
 Pt spouse notified DPR

## 2023-06-12 ENCOUNTER — Inpatient Hospital Stay (HOSPITAL_COMMUNITY)

## 2023-06-12 ENCOUNTER — Telehealth: Payer: Self-pay | Admitting: Hematology and Oncology

## 2023-06-12 DIAGNOSIS — J189 Pneumonia, unspecified organism: Secondary | ICD-10-CM | POA: Diagnosis not present

## 2023-06-12 LAB — CBC
HCT: 35.6 % — ABNORMAL LOW (ref 39.0–52.0)
Hemoglobin: 11.3 g/dL — ABNORMAL LOW (ref 13.0–17.0)
MCH: 28 pg (ref 26.0–34.0)
MCHC: 31.7 g/dL (ref 30.0–36.0)
MCV: 88.1 fL (ref 80.0–100.0)
Platelets: 289 10*3/uL (ref 150–400)
RBC: 4.04 MIL/uL — ABNORMAL LOW (ref 4.22–5.81)
RDW: 18.4 % — ABNORMAL HIGH (ref 11.5–15.5)
WBC: 13.6 10*3/uL — ABNORMAL HIGH (ref 4.0–10.5)
nRBC: 0.3 % — ABNORMAL HIGH (ref 0.0–0.2)

## 2023-06-12 LAB — BASIC METABOLIC PANEL WITH GFR
Anion gap: 8 (ref 5–15)
BUN: 19 mg/dL (ref 8–23)
CO2: 37 mmol/L — ABNORMAL HIGH (ref 22–32)
Calcium: 9 mg/dL (ref 8.9–10.3)
Chloride: 93 mmol/L — ABNORMAL LOW (ref 98–111)
Creatinine, Ser: 0.5 mg/dL — ABNORMAL LOW (ref 0.61–1.24)
GFR, Estimated: >60 mL/min (ref >60)
Glucose, Bld: 71 mg/dL (ref 70–99)
Potassium: 4.4 mmol/L (ref 3.5–5.1)
Sodium: 138 mmol/L (ref 135–145)

## 2023-06-12 LAB — CULTURE, BLOOD (ROUTINE X 2)
Culture: NO GROWTH
Culture: NO GROWTH
Special Requests: ADEQUATE

## 2023-06-12 MED ORDER — IOHEXOL 350 MG/ML SOLN
75.0000 mL | Freq: Once | INTRAVENOUS | Status: AC | PRN
  Administered 2023-06-12: 75 mL via INTRAVENOUS

## 2023-06-12 NOTE — Telephone Encounter (Signed)
 Received consult today on this patient who has been diagnosed with adenocarcinoma from pleural fluid cytology, likely lung primary The patient was seen at [facility] in January of this year for leukocytosis The patient will be referred to [facility] for follow-up and management

## 2023-06-12 NOTE — Progress Notes (Addendum)
 PROGRESS NOTE  CURRY SEEFELDT ZOX:[id number] DOB: May 04, 1955 DOA: 06/06/2023 PCP: Ladora Daniel, PA-C   LOS: 6 days   Brief narrative:  Curtis Flores is a 68 y.o. 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  Of note, patient was recently diagnosed with left lower lobe pneumonia on 04/19/2023 and had completed Levaquin course at that time.  He had been having persistent symptoms of shortness of breath subjective fever productive cough since then but then the symptoms got worse so he decided to come to the hospital.  In the ED, patient was noted to be tachypneic with mild bradycardia.  Pulse ox was 90% on room air.  WBC was 10.7.  Sodium was low at 128 with albumin low at 2.8.  AST 66, ALT 70, ALP 152, BNP 336 (this was 386 on 04/19/2023).  Influenza A, B, SARS, RSV was negative.  CT chest with contrast overall  stable appearance of the pleural effusions bilaterally left greater than right. Persistent fibrotic change and left lower lobe consolidation is seen and stable.  Chest x-ray showed redemonstration of left retrocardiac airspace opacity, likely combination of left lung atelectasis and/or consolidation with pleural effusion. Pulmonology was consulted and recommended broad-spectrum antibiotic, Solu-Medrol and pleural tapping.  He was treated with IV vancomycin and cefepime, IV Solu-Medrol was started.  Patient was then admitted hospital for further evaluation and treatment.  During hospitalization, patient underwent pleural tapping by interventional radiology and had collapse of the lung.  Slowly improving at this time with conservative treatment.  Still getting antibiotic.   Assessment/Plan: Principal Problem:   HCAP (healthcare-associated pneumonia) Active Problems:   Pulmonary fibrosis (HCC)   Mixed hyperlipidemia    GERD (gastroesophageal reflux disease)   Essential hypertension   Bilateral pleural effusion   Hyponatremia   Transaminitis   Elevated brain natriuretic peptide (BNP) level   Prostate cancer metastatic to bone (HCC)  HCAP in the setting of rheumatoid arthritis related pulmonary fibrosis Continue  cefepime, azithromycin, plan for 7-day course.  Stop date 06/13/23.[name]    Blood cultures negative in 5 days.  Continue Mucinex, incentive spirometry, flutter valve.  On Solu-Medrol 40mg  daily, switch to prednisone 40 mg daily and taper 10 mg every 2 days.  Urinary strep pneumoniae antigen negative.  Continue nebulizers and supportive care..  Pulmonary function test 06-04-23 with severe restriction continue p.o. Lasix from home.  Patient follows up with Dr. Vassie Loll pulmonary as outpatient.   Bilateral pleural effusion (L > R) Malignant pleural effusion with bony metastasis. Metastatic cancer likely lung cancer primary. Status post thoracocentesis with left lung collapse.  Exudative fluid.  Patient will continue with incentive spirometry, flutter valve.  Repeat chest x-ray with improved aeration.  Pulmonary follow the patient during hospitalization.  Continue IV cefepime to complete the course. CT chest with contrast was suggestive of extensive sclerotic osseous lesions.  Pleural fluid was positive for adenocarcinoma likely lung primary.[name] Spoke with pulmonary on 4/9/2025and  Dr. Isaiah Serge recommended oncology evaluation.  I spoke with Dr. Bertis Ruddy  on-call oncology who recommended follow-up Dr Lossie Faes at [facility] and she will coordinate that. Patient wishes to follow up close to his home.    PSA was 0.7.  Will get CT scan of the abdomen and pelvis with contrast as well to evaluate for liver and intra-abdominal metastasis..  Acute respiratory failure with hypoxia  related to pulmonary fibrosis and  pneumonia.  Continue supplemental oxygen to maintain O2 sats > 92% p, continue nasal cannula oxygen currently at 2 L/min.   Will continue to wean off oxygen if possible..  Might need oxygen on discharge.   Hyponatremia Resolved.  Latest sodium of 138  Elevated LFTs. AST ALT has slightly improved.  No abdominal pain.  Right upper quadrant ultrasound without any gallstones or ductal dilatation.  Will get CT scan of the abdomen and pelvis with contrast to assess for metastasis.  Hypoalbuminemia  Albumin 2.8, encourage oral nutrition.  Dietary on board.  Continue Ensure.   Essential hypertension Continue amlodipine, losartan.  Blood pressure slightly elevated this morning.   Mixed hyperlipidemia Hold statins due to elevated LFT.  Latest AST ALT of 50 followed by 62.   GERD Continue Protonix   History of atrial flutter.  On Eliquis.  DVT prophylaxis: SCDs Start: 06/07/23 0031 apixaban (ELIQUIS) tablet 5 mg   Disposition: Likely home with home on 06/13/2023.  Status is: Inpatient  Remains inpatient appropriate because, IV antibiotics, supplemental oxygen, oncology referral.    Code Status:     Code Status: Full Code  Family Communication:   Spoke with the spouse on the phone on 4/ 9/25  and updated her about the clinical condition of the patient and cytology report.  Consultants: PCCM  Procedures: Ultrasound-guided thoracocentesis on 06/07/2023 with removal of 50 mL of amber-colored fluid.  Anti-infectives:  Azithromycin -completed cefepime IV  Anti-infectives (From admission, onward)    Start     Dose/Rate Route Frequency Ordered Stop   06/07/23 1200  azithromycin (ZITHROMAX) 500 mg in sodium chloride 0.9 % 250 mL IVPB        500 mg 250 mL/hr over 60 Minutes Intravenous Every 24 hours 06/07/23 1109 06/09/23 1438   06/07/23 1000  vancomycin (VANCOREADY) IVPB 750 mg/150 mL  Status:  Discontinued       Placed in "Followed by" Linked Group   750 mg 150 mL/hr over 60 Minutes Intravenous Every 12 hours 06/06/23 2027 06/07/23 1231   06/07/23 0600  ceFEPIme (MAXIPIME) 2 g in sodium chloride 0.9  % 100 mL IVPB        2 g 200 mL/hr over 30 Minutes Intravenous Every 8 hours 06/07/23 0105 06/13/23 2359   06/06/23 2030  ceFEPIme (MAXIPIME) 2 g in sodium chloride 0.9 % 100 mL IVPB        2 g 200 mL/hr over 30 Minutes Intravenous  Once 06/06/23 2020 06/06/23 2125   06/06/23 2030  vancomycin (VANCOREADY) IVPB 1500 mg/300 mL       Placed in "Followed by" Linked Group   1,500 mg 150 mL/hr over 120 Minutes Intravenous  Once 06/06/23 2027 06/06/23 2342       Subjective: Today, patient was seen and examined at bedside.  Patient sitting up in the chair.  Has some shortness of breath but no chest pain fever chills or rigor.  Spoke about further treatment plan.   Objective: Vitals:   06/12/23 0500 06/12/23 0835  BP: 134/62 (!) 147/108  Pulse: 66 67  Resp: 18 18  Temp: 97.7 F (36.5 C) 98.5 F (36.9 C)  SpO2: 100% 100%    Intake/Output Summary (Last 24 hours) at 06/12/2023 1131 Last data filed at 06/12/2023 0649 Gross per 24 hour  Intake 420.33 ml  Output 2650 ml  Net -2229.67 ml   Filed Weights   06/09/23 1421  Weight: 71.9 kg   Body mass index is 25.2 kg/m.  Physical Exam:  GENERAL: Patient is alert awake and oriented. Not in obvious distress.  On nasal cannula oxygen. HENT: No scleral pallor or icterus. Pupils equally reactive to light. Oral mucosa is moist NECK: is supple, no gross swelling noted. CHEST: Coarse breath sounds noted.  Diminished breath sounds bilaterally. CVS: S1 and S2 heard, no murmur. Regular rate and rhythm.  ABDOMEN: Soft, non-tender, bowel sounds are present. EXTREMITIES: Bilateral lower extremity trace edema. CNS: Cranial nerves are intact. No focal motor deficits. SKIN: warm and dry without rashes.  Data Review: I have personally reviewed the following laboratory data and studies,  CBC: Recent Labs  Lab 06/06/23 1540 06/07/23 0250 06/08/23 0702 06/09/23 0442 06/10/23 0441 06/12/23 0450  WBC 10.7* 12.3* 15.3* 16.8* 13.0* 13.6*   NEUTROABS 7.6  --   --   --   --   --   HGB 12.6* 13.3 11.7* 11.2* 11.3* 11.3*  HCT 39.4 40.9 36.3* 35.1* 36.0* 35.6*  MCV 86.6 86.8 86.8 88.0 87.6 88.1  PLT 272 305 248 272 267 289   Basic Metabolic Panel: Recent Labs  Lab 06/07/23 0250 06/08/23 0702 06/09/23 0442 06/10/23 0441 06/12/23 0450  NA 133* 133* 136 137 138  K 4.6 4.7 5.1 4.9 4.4  CL 95* 97* 100 96* 93*  CO2 26 27 33* 34* 37*  GLUCOSE 90 114* 99 79 71  BUN 9 17 19 16 19   CREATININE 0.54* 0.49* 0.53* 0.38* 0.50*  CALCIUM 9.4 8.9 9.3 9.3 9.0  MG 2.1 2.3 2.4 2.4  --   PHOS 4.6  --   --  2.9  --    Liver Function Tests: Recent Labs  Lab 06/06/23 1540 06/07/23 0250 06/09/23 0442  AST 66* 61* 50*  ALT 70* 68* 62*  ALKPHOS 152* 165* 110  BILITOT 0.9 1.1 0.5  PROT 7.1 7.4 5.9*  ALBUMIN 2.8* 2.6* 2.1*   No results for input(s): "LIPASE", "AMYLASE" in the last 168 hours. No results for input(s): "AMMONIA" in the last 168 hours. Cardiac Enzymes: No results for input(s): "CKTOTAL", "CKMB", "CKMBINDEX", "TROPONINI" in the last 168 hours. BNP (last 3 results) Recent Labs    04/19/23 1130 06/06/23 1540  BNP 386.0* 336.0*    ProBNP (last 3 results) No results for input(s): "PROBNP" in the last 8760 hours.  CBG: No results for input(s): "GLUCAP" in the last 168 hours. Recent Results (from the past 240 hours)  Resp panel by RT-PCR (RSV, Flu A&B, Covid) Anterior Nasal Swab     Status: None   Collection Time: 06/06/23  3:28 PM   Specimen: Anterior Nasal Swab  Result Value Ref Range Status   SARS Coronavirus 2 by RT PCR NEGATIVE NEGATIVE Final    Comment: (NOTE) SARS-CoV-2 target nucleic acids are NOT DETECTED.  The SARS-CoV-2 RNA is generally detectable in upper respiratory specimens during the acute phase of infection. The lowest concentration of SARS-CoV-2 viral copies this assay can detect is 138 copies/mL. A negative result does not preclude SARS-Cov-2 infection and should not be used as the sole basis  for treatment or other patient management decisions. A negative result may occur with  improper specimen collection/handling, submission of specimen other than nasopharyngeal swab, presence of viral mutation(s) within the areas targeted by this assay, and inadequate number of viral copies(<138 copies/mL). A negative result must be combined with clinical observations, patient history, and epidemiological information. The expected result is Negative.  Fact Sheet for Patients:  BloggerCourse.com  Fact Sheet for Healthcare Providers:  SeriousBroker.it  This test is no t yet approved or cleared by the [place] and  has been authorized for detection and/or diagnosis of SARS-CoV-2 by FDA under an Emergency Use Authorization (EUA). This EUA will remain  in effect (meaning this test can be used) for the duration of the COVID-19 declaration under Section 564(b)(1) of the Act, 21 U.S.C.section 360bbb-3(b)(1), unless the authorization is terminated  or revoked sooner.       Influenza A by PCR NEGATIVE NEGATIVE Final   Influenza B by PCR NEGATIVE NEGATIVE Final    Comment: (NOTE) The Xpert Xpress SARS-CoV-2/FLU/RSV plus assay is intended as an aid in the diagnosis of influenza from Nasopharyngeal swab specimens and should not be used as a sole basis for treatment. Nasal washings and aspirates are unacceptable for Xpert Xpress SARS-CoV-2/FLU/RSV testing.  Fact Sheet for Patients: BloggerCourse.com  Fact Sheet for Healthcare Providers: SeriousBroker.it  This test is not yet approved or cleared by the [place] FDA and has been authorized for detection and/or diagnosis of SARS-CoV-2 by FDA under an Emergency Use Authorization (EUA). This EUA will remain in effect (meaning this test can be used) for the duration of the COVID-19 declaration under Section 564(b)(1) of the Act, 21  U.S.C. section 360bbb-3(b)(1), unless the authorization is terminated or revoked.     Resp Syncytial Virus by PCR NEGATIVE NEGATIVE Final    Comment: (NOTE) Fact Sheet for Patients: BloggerCourse.com  Fact Sheet for Healthcare Providers: SeriousBroker.it  This test is not yet approved or cleared by the [place] FDA and has been authorized for detection and/or diagnosis of SARS-CoV-2 by FDA under an Emergency Use Authorization (EUA). This EUA will remain in effect (meaning this test can be used) for the duration of the COVID-19 declaration under Section 564(b)(1) of the Act, 21 U.S.C. section 360bbb-3(b)(1), unless the authorization is terminated or revoked.  Performed at [facility], [address], [place], [place] [address]   MRSA Next Gen by PCR, Nasal     Status: None   Collection Time: 06/06/23  8:23 PM   Specimen: Nasal Mucosa; Nasal Swab  Result Value Ref Range Status   MRSA by PCR Next Gen NOT DETECTED NOT DETECTED Final    Comment: (NOTE) The GeneXpert MRSA Assay (FDA approved for NASAL specimens only), is one component of a comprehensive MRSA colonization surveillance program. It is not intended to diagnose MRSA infection nor to guide or monitor treatment for MRSA infections. Test performance is not FDA approved in patients less than 2 years old. Performed at [facility], [address], [place], [place] [address]   Blood culture (routine x 2)     Status: None   Collection Time: 06/06/23  8:44 PM   Specimen: BLOOD RIGHT HAND  Result Value Ref Range Status   Specimen Description BLOOD RIGHT HAND  Final   Special Requests   Final    BOTTLES DRAWN AEROBIC AND ANAEROBIC Blood Culture adequate volume   Culture   Final    NO GROWTH 5 DAYS Performed at [facility], [address] [place], [place] [address]    Report Status 06/11/2023 FINAL  Final  Blood culture (routine x 2)     Status: None   Collection Time:  06/06/23  8:51 PM   Specimen: Left Antecubital; Blood  Result Value Ref Range Status   Specimen Description LEFT ANTECUBITAL  Final   Special Requests   Final    BOTTLES DRAWN AEROBIC AND ANAEROBIC Blood Culture adequate volume  Culture   Final    NO GROWTH 5 DAYS Performed at [facility], [address] [place], [place] [address]    Report Status 06/11/2023 FINAL  Final  Culture, blood (routine x 2) Call MD if unable to obtain prior to antibiotics being given     Status: None   Collection Time: 06/07/23  2:48 AM   Specimen: BLOOD RIGHT HAND  Result Value Ref Range Status   Specimen Description BLOOD RIGHT HAND  Final   Special Requests   Final    BOTTLES DRAWN AEROBIC AND ANAEROBIC Blood Culture results may not be optimal due to an inadequate volume of blood received in culture bottles   Culture   Final    NO GROWTH 5 DAYS Performed at [facility], [address], [place], [place] [address]    Report Status 06/12/2023 FINAL  Final  Culture, blood (routine x 2) Call MD if unable to obtain prior to antibiotics being given     Status: None   Collection Time: 06/07/23  2:49 AM   Specimen: BLOOD  Result Value Ref Range Status   Specimen Description BLOOD LEFT ANTECUBITAL  Final   Special Requests   Final    BOTTLES DRAWN AEROBIC AND ANAEROBIC Blood Culture adequate volume   Culture   Final    NO GROWTH 5 DAYS Performed at [facility], [address], [place], [place] [address]    Report Status 06/12/2023 FINAL  Final     Studies: No results found.     Joycelyn Das, MD  [facility] 06/12/2023  If 7PM-7AM, please contact night-coverage

## 2023-06-12 NOTE — Progress Notes (Signed)
 Mobility Specialist Progress Note:   06/12/23 1300  Mobility  Activity Ambulated independently in hallway  Level of Assistance Standby assist, set-up cues, supervision of patient - no hands on  Assistive Device None  Distance Ambulated (ft) 130 ft  Activity Response Tolerated well  Mobility Referral Yes  Mobility visit 1 Mobility  Mobility Specialist Start Time (ACUTE ONLY) 1320  Mobility Specialist Stop Time (ACUTE ONLY) 1329  Mobility Specialist Time Calculation (min) (ACUTE ONLY) 9 min    Pre Mobility: 92% SpO2 RA During Mobility:  78% SpO2 RA Post Mobility:  97% SpO2 2L   Pt received in chair and agreeable. Required no physical assistance throughout. Desat to 78% SpO2 on room air. Rose to 85% w/ purse lipped breathing and required 2L O2 to return [facility]. No complaints throughout. Pt left in chair with call bell and all needs met.  D'Vante Earlene Plater Mobility Specialist Please contact via Special educational needs teacher or Rehab office at [phone]

## 2023-06-12 NOTE — Progress Notes (Signed)
 Nurse requested Mobility Specialist to perform oxygen saturation test with pt which includes removing pt from oxygen both at rest and while ambulating.  Below are the results from that testing.     Patient Saturations on Room Air at Rest = spO2 92%  Patient Saturations on Room Air while Ambulating = sp02 78% .  Rested and performed pursed lip breathing for 1 minute with sp02 at 85%.  Patient Saturations on 2 Liters of oxygen while Ambulating = sp02 97%  At end of testing pt left in room on 2  Liters of oxygen.  Reported results to nurse.   Curtis Flores Mobility Specialist Please contact via Special educational needs teacher or Rehab office at [phone]

## 2023-06-12 NOTE — Progress Notes (Signed)
 Mobility Specialist Progress Note:   06/12/23 1000  Oxygen Therapy  O2 Device Nasal Cannula  O2 Flow Rate (L/min) 2 L/min  Mobility  Activity Ambulated independently in hallway  Level of Assistance Standby assist, set-up cues, supervision of patient - no hands on  Assistive Device None  Distance Ambulated (ft) 130 ft  Activity Response Tolerated well  Mobility Referral Yes  Mobility visit 1 Mobility  Mobility Specialist Start Time (ACUTE ONLY) [id number]  Mobility Specialist Stop Time (ACUTE ONLY) 0948  Mobility Specialist Time Calculation (min) (ACUTE ONLY) 12 min   Pt received in chair and agreeable. C/o some fatigue, otherwise asymptomatic. Required no physical assistance throughout. SpO2 in high 90s throughout on 2L O2. Denied any SOB. Pt left in chair with call bell and all needs met.  D'Vante Earlene Plater Mobility Specialist Please contact via Special educational needs teacher or Rehab office at [phone]

## 2023-06-13 ENCOUNTER — Other Ambulatory Visit: Payer: Self-pay | Admitting: Physician Assistant

## 2023-06-13 DIAGNOSIS — J189 Pneumonia, unspecified organism: Secondary | ICD-10-CM | POA: Diagnosis not present

## 2023-06-13 DIAGNOSIS — C801 Malignant (primary) neoplasm, unspecified: Secondary | ICD-10-CM | POA: Insufficient documentation

## 2023-06-13 LAB — BASIC METABOLIC PANEL WITH GFR
Anion gap: 6 (ref 5–15)
BUN: 18 mg/dL (ref 8–23)
CO2: 39 mmol/L — ABNORMAL HIGH (ref 22–32)
Calcium: 9.2 mg/dL (ref 8.9–10.3)
Chloride: 94 mmol/L — ABNORMAL LOW (ref 98–111)
Creatinine, Ser: 0.44 mg/dL — ABNORMAL LOW (ref 0.61–1.24)
GFR, Estimated: >60 mL/min (ref >60)
Glucose, Bld: 77 mg/dL (ref 70–99)
Potassium: 4.4 mmol/L (ref 3.5–5.1)
Sodium: 139 mmol/L (ref 135–145)

## 2023-06-13 LAB — CBC
HCT: 37.2 % — ABNORMAL LOW (ref 39.0–52.0)
Hemoglobin: 11.7 g/dL — ABNORMAL LOW (ref 13.0–17.0)
MCH: 27.7 pg (ref 26.0–34.0)
MCHC: 31.5 g/dL (ref 30.0–36.0)
MCV: 87.9 fL (ref 80.0–100.0)
Platelets: 293 10*3/uL (ref 150–400)
RBC: 4.23 MIL/uL (ref 4.22–5.81)
RDW: 18.6 % — ABNORMAL HIGH (ref 11.5–15.5)
WBC: 13.1 10*3/uL — ABNORMAL HIGH (ref 4.0–10.5)
nRBC: 0 % (ref 0.0–0.2)

## 2023-06-13 LAB — MAGNESIUM: Magnesium: 2.4 mg/dL (ref 1.7–2.4)

## 2023-06-13 MED ORDER — PREDNISONE 5 MG PO TABS: 5.0000 mg | ORAL_TABLET | Freq: Every day | ORAL | Status: DC

## 2023-06-13 MED ORDER — PREDNISONE 20 MG PO TABS: ORAL_TABLET | ORAL | 0 refills | Status: AC

## 2023-06-13 MED ORDER — ENSURE ENLIVE PO LIQD: 237.0000 mL | Freq: Two times a day (BID) | ORAL | Status: DC

## 2023-06-13 MED ORDER — DM-GUAIFENESIN ER 30-600 MG PO TB12: 1.0000 | ORAL_TABLET | Freq: Two times a day (BID) | ORAL | 0 refills | Status: AC

## 2023-06-13 MED ORDER — DOCUSATE SODIUM 100 MG PO CAPS: 100.0000 mg | ORAL_CAPSULE | Freq: Every day | ORAL | 0 refills | Status: DC

## 2023-06-13 NOTE — TOC Transition Note (Signed)
 Transition of Care [facility]) - Discharge Note   Patient Details  Name: Curtis Flores MRN: [id number] Date of Birth: January 29, 1956  [facility] CM/SW Contact:  Tom-Johnson, Hershal Coria, RN Phone Number: 06/13/2023, 10:02 AM   Clinical Narrative:     Patient is scheduled for discharge today.  Readmission Risk Assessment done. Outpatient referral, hospital f/u and discharge instructions on AVS. Home O2 ordered from [employer] per patient's request and Earna Coder to deliver to patient at the discharge lounge prior to discharge. Daughter to transport at discharge.  No further TOC needs noted.        Final next level of care: Home/Self Care Barriers to Discharge: Barriers Resolved   Patient Goals and CMS Choice Patient states their goals for this hospitalization and ongoing recovery are:: To return home CMS Medicare.gov Compare Post Acute Care list provided to:: Patient Choice offered to / list presented to : Patient, Spouse      Discharge Placement                Patient to be transferred to facility by: Daughter      Discharge Plan and Services Additional resources added to the After Visit Summary for                  DME Arranged: Oxygen DME Agency: [employer] Date DME Agency Contacted: 06/13/23 Time DME Agency Contacted: [phone] Representative spoke with at DME Agency: Earna Coder HH Arranged: NA HH Agency: NA        Social Drivers of Health (SDOH) Interventions SDOH Screenings   Food Insecurity: No Food Insecurity (06/07/2023)  Housing: Patient Declined (06/07/2023)  Transportation Needs: No Transportation Needs (06/07/2023)  Utilities: Not At Risk (06/07/2023)  Depression (PHQ2-9): Low Risk  (03/09/2021)  Financial Resource Strain: Low Risk  (04/29/2023)   Received from [facility]  Physical Activity: Unknown (04/29/2023)   Received from [facility]  Social Connections: Patient Declined (06/07/2023)  Stress: No Stress Concern Present (04/29/2023)   Received  from [facility]  Tobacco Use: Medium Risk (06/06/2023)     Readmission Risk Interventions    06/09/2023    4:41 PM  Readmission Risk Prevention Plan  Transportation Screening Complete  PCP or Specialist Appt within 3-5 Days Complete  Social Work Consult for Recovery Care Planning/Counseling Complete  Palliative Care Screening Not Applicable  Medication Review Oceanographer) Referral to Pharmacy

## 2023-06-13 NOTE — Discharge Summary (Signed)
 Physician Discharge Summary  Curtis Flores UJW:[id number] DOB: 1955-05-19 DOA: 06/06/2023  PCP: Ladora Daniel, PA-C  Admit date: 06/06/2023 Discharge date: 06/13/2023  Admitted From: Home  Discharge disposition: Home with home oxygen  Recommendations for Outpatient Follow-Up:   Follow up with your primary care provider in one week.  Check CBC, BMP, magnesium in the next visit Follow-up with oncology as outpatient (internal referral has been made. Follow-up with Dr. Vassie Loll pulmonary in 2 to 3 weeks or as scheduled by the clinic.  Discharge Diagnosis:   Principal Problem:   HCAP (healthcare-associated pneumonia) Active Problems:   Pulmonary fibrosis (HCC)   Mixed hyperlipidemia   GERD (gastroesophageal reflux disease)   Essential hypertension   Bilateral pleural effusion   Hyponatremia   Transaminitis   Elevated brain natriuretic peptide (BNP) level   Prostate cancer metastatic to bone (HCC)   Adenocarcinoma (HCC)   Discharge Condition: Improved.  Diet recommendation:   Regular.  Wound care: None.  Code status: Full.   History of Present Illness:   Curtis Flores is a 68 y.o. 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  Of note, patient was recently diagnosed with left lower lobe pneumonia on 04/19/2023 and had completed Levaquin course at that time.  He had been having persistent symptoms of shortness of breath subjective fever productive cough since then but then the symptoms got worse so he decided to come to the hospital.  In the ED, patient was noted to be tachypneic with mild bradycardia.  Pulse ox was 90% on room air.  WBC was 10.7.  Sodium was low at 128 with albumin low at 2.8.  AST 66, ALT 70, ALP 152, BNP 336 (this was 386 on 04/19/2023).  Influenza A, B, SARS, RSV was negative.  CT chest  with contrast overall  stable appearance of the pleural effusions bilaterally left greater than right. Persistent fibrotic change and left lower lobe consolidation is seen and stable.  Chest x-ray showed redemonstration of left retrocardiac airspace opacity, likely combination of left lung atelectasis and/or consolidation with pleural effusion. Pulmonology was consulted and recommended broad-spectrum antibiotic, Solu-Medrol and pleural tapping.  He was treated with IV vancomycin and cefepime, IV Solu-Medrol was started.  Patient was then admitted hospital for further evaluation and treatment. During hospitalization, patient underwent pleural tapping by interventional radiology and had collapse of the lung and this has subsequently improved.. .   Hospital Course:   Following conditions were addressed during hospitalization as listed below,  HCAP in the setting of rheumatoid arthritis related pulmonary fibrosis Received cefepime, azithromycin during hospitalization and has completed course.   Blood cultures negative in 5 days.  Patient received Mucinex, incentive spirometry, flutter valve.  On Solu-Medrol 40mg  daily, switch to prednisone 40 mg daily and taper 10 mg every 2 days.  Urinary strep pneumoniae antigen negative.  Continue nebulizers and supportive care..  Pulmonary function test 06-04-23 with severe restriction continue p.o. Lasix from home.  Patient follows up with Dr. Vassie Loll pulmonary as outpatient.  To follow-up as outpatient.   Bilateral pleural effusion (L > R) Malignant pleural effusion with bony metastasis. Metastatic cancer likely lung cancer primary. Status post thoracocentesis with left lung collapse.  Exudative fluid. Pulmonary followed the patient during hospitalization.  Patient has completed the course of antibiotic.  CT chest with contrast was suggestive of extensive sclerotic osseous lesions.  Patient  underwent thoracocentesis and pleural fluid was positive for adenocarcinoma likely  lung primary.[name] Spoke with pulmonary on 06/11/2023 and  Dr. Isaiah Serge recommended oncology evaluation.  I spoke with Dr. Bertis Ruddy  on-call oncology who recommended follow-up Dr Lossie Faes at [facility] and she will coordinate that. Patient wishes to follow up close to his home.    PSA was 0.7. CT scan of the abdomen and pelvis with contrast was done which also showed bony metastasis with possible lesions in the adrenal and liver.  Will need to discuss with oncology as outpatient for further treatment plan.  Acute respiratory failure with hypoxia  related to pulmonary fibrosis and pneumonia.  At this time patient has qualified for home oxygen on discharge.  Hyponatremia Resolved.  Latest sodium of 138   Elevated LFTs. AST ALT has slightly improved.  No abdominal pain.  Right upper quadrant ultrasound without any gallstones or ductal dilatation.   CT scan of the abdomen and pelvis with contrast showed diffuse sclerotic bony metastasis with indeterminate left adrenal lesion and subcentimeter hypodensities in the right lobe of the liver.  Will need to follow-up with oncology as outpatient.   Hypoalbuminemia  Albumin 2.8, encourage oral nutrition.  Dietary on board.  Continue Ensure on discharge..   Essential hypertension Continue amlodipine, losartan.    Mixed hyperlipidemia On statins as outpatient.  Will resume on discharge.   GERD Continue Protonix from home.   History of atrial flutter.  On Eliquis.  Disposition.  At this time, patient is stable for disposition home with outpatient PCP, pulmonary and oncology follow-up  Medical Consultants:   Pulmonary Oncology verbal opinion  Procedures:    Ultrasound-guided thoracocentesis on 06/07/2023 with removal of 50 mL of amber-colored fluid.  Subjective:   Today, patient seen and examined at bedside.  Ambulating okay but requires oxygen.  Denies overt dyspnea chest pain fever or chills.  Discharge Exam:   Vitals:   06/13/23 0605 06/13/23  0908  BP: (!) 160/65 (!) 134/56  Pulse: (!) 55 63  Resp: 19 18  Temp: 97.6 F (36.4 C) 98.4 F (36.9 C)  SpO2: 99% 99%   Vitals:   06/12/23 0835 06/12/23 2142 06/13/23 0605 06/13/23 0908  BP: (!) 147/108 (!) 155/82 (!) 160/65 (!) 134/56  Pulse: 67 (!) 52 (!) 55 63  Resp: 18 18 19 18   Temp: 98.5 F (36.9 C) (!) 97.3 F (36.3 C) 97.6 F (36.4 C) 98.4 F (36.9 C)  TempSrc:  Oral Oral   SpO2: 100% 100% 99% 99%  Weight:      Height:       Body mass index is 25.2 kg/m.  General: Alert awake, not in obvious distress, on nasal cannula oxygen. HENT: pupils equally reacting to light,  No scleral pallor or icterus noted. Oral mucosa is moist.  Chest:    Diminished breath sounds bilaterally.  Coarse breath sounds noted. CVS: S1 &S2 heard. No murmur.  Regular rate and rhythm. Abdomen: Soft, nontender, nondistended.  Bowel sounds are heard.   Extremities: No cyanosis, clubbing or edema.  Peripheral pulses are palpable. Psych: Alert, awake and oriented, normal mood CNS:  No cranial nerve deficits.  Power equal in all extremities.   Skin: Warm and dry.  No rashes noted.  The results of significant diagnostics from this hospitalization (including imaging, microbiology, ancillary and laboratory) are listed below for reference.     Diagnostic Studies:   DG Chest 1 View Addendum Date: 06/07/2023 ADDENDUM REPORT: 06/07/2023 12:24 ADDENDUM: Study discussed by  telephone with Dr. Tyson Babinski on 06/07/2023 at 1214 hours. Electronically Signed   By: Odessa Fleming M.D.   On: 06/07/2023 12:24   Result Date: 06/07/2023 CLINICAL DATA:  68 year old male status post ultrasound-guided left side thoracentesis this morning. EXAM: CHEST  1 VIEW COMPARISON:  Chest CT yesterday. FINDINGS: AP view of the chest 1020 hours. White out of the left lung now, but slightly leftward shift of the mediastinum when compared to the CT scout view yesterday. No pneumothorax is identified. Right lung ventilation with patchy and coarse  opacity is stable. Visualized tracheal air column is within normal limits. Negative visible bowel gas. No acute osseous abnormality identified. IMPRESSION: 1. Abnormal white out of the left lung since yesterday, but with some leftward mediastinal shift since that time suggesting this is mucous plugging and lung collapse. 2. No pneumothorax is identified. Electronically Signed: By: Odessa Fleming M.D. On: 06/07/2023 11:57   US Abdomen Limited RUQ (LIVER/GB) Result Date: 06/07/2023 CLINICAL DATA:  Elevated liver function tests EXAM: ULTRASOUND ABDOMEN LIMITED RIGHT UPPER QUADRANT COMPARISON:  None Available. FINDINGS: Gallbladder: No gallstones or wall thickening visualized. No sonographic Murphy sign noted by sonographer. Common bile duct: Diameter: 5 mm Liver: No focal lesion identified. Within normal limits in parenchymal echogenicity. Portal vein is patent on color Doppler imaging with normal direction of blood flow towards the liver. Other: Small right pleural effusion. IMPRESSION: No gallstones or ductal dilatation. Small right pleural effusion. Please correlate with chest CT of 06/06/2023 Electronically Signed   By: Karen Kays M.D.   On: 06/07/2023 12:01   US THORACENTESIS ASP PLEURAL SPACE W/IMG GUIDE Result Date: 06/07/2023 INDICATION: 69 year old male. Recent history of pneumonia. Admitted for shortness of breath team is requesting a therapeutic and diagnostic left-sided thoracentesis EXAM: ULTRASOUND GUIDED THERAPEUTIC AND DIAGNOSTIC LEFT-SIDED THORACENTESIS MEDICATIONS: Lidocaine 1% 10 mL COMPLICATIONS: None immediate. PROCEDURE: An ultrasound guided thoracentesis was thoroughly discussed with the patient and questions answered. The benefits, risks, alternatives and complications were also discussed. The patient understands and wishes to proceed with the procedure. Written consent was obtained. Ultrasound was performed to localize and mark an adequate pocket of fluid in the left chest. The area was then  prepped and draped in the normal sterile fashion. 1% Lidocaine was used for local anesthesia. Under ultrasound guidance a 6 Fr Safe-T-Centesis catheter was introduced. Thoracentesis was performed. The catheter was removed and a dressing applied. FINDINGS: A total of approximately 250 mL of amber color fluid was removed. Samples were sent to the laboratory as requested by the clinical team. IMPRESSION: Successful ultrasound guided therapeutic and diagnostic left-sided thoracentesis yielding 250 mL amber: pleural fluid. Performed by Anders Grant NP Electronically Signed   By: Gilmer Mor D.O.   On: 06/07/2023 11:19   CT Chest W Contrast Result Date: 06/06/2023 CLINICAL DATA:  Follow-up left-sided pleural effusion EXAM: CT CHEST WITH CONTRAST TECHNIQUE: Multidetector CT imaging of the chest was performed during intravenous contrast administration. RADIATION DOSE REDUCTION: This exam was performed according to the departmental dose-optimization program which includes automated exposure control, adjustment of the mA and/or kV according to patient size and/or use of iterative reconstruction technique. CONTRAST:  75mL OMNIPAQUE IOHEXOL 300 MG/ML  SOLN COMPARISON:  05/26/2023, 04/19/2023 FINDINGS: Cardiovascular: Atherosclerotic calcifications of the thoracic aorta are noted without aneurysmal dilatation or dissection. Heavy coronary calcifications are seen. The heart is at the upper limits of normal in size. No sizable central pulmonary emboli are noted. Mediastinum/Nodes: Thoracic inlet is within normal limits. Scattered likely reactive  lymph nodes are noted in the mediastinum similar to that seen on the prior exam. The largest of these is in the subcarinal region measuring 12 mm slightly decreased when compared with the prior exam. The hilar nodes are also slightly decreased in size when compared with the prior study. The esophagus is air-filled and within normal limits. A small sliding-type hiatal hernia is  noted. Lungs/Pleura: Bilateral pleural effusions are again identified left worse than right with fluid trapped in the major fissure on the left. The right-sided effusion is stable when compared with 05/26/2023 but increased when compared with the prior exam from February of 2025. Persistent diffuse fibrotic changes are identified in both lungs with some area of adjacent consolidation particularly in the left lower lobe also stable from the prior study. No new parenchymal nodules or areas of consolidation are noted. Upper Abdomen: Visualized upper abdomen shows no acute abnormality. Some scarring in the right kidney is noted. Musculoskeletal: Multiple sclerotic lesions are again identified throughout the bony structures to include the ribcage, thoracic spine and proximal humeri bilaterally. Scapular and sternal lesions are noted as well. These are again highly suggestive of metastatic prostate disease. Correlation with PSA is recommended. IMPRESSION: Overall stable appearance of the pleural effusions bilaterally left greater than right. Persistent fibrotic change and left lower lobe consolidation is seen and stable. No new areas of consolidation are noted. Hiatal hernia. Extensive sclerotic osseous lesions highly suggestive of metastatic prostate carcinoma. Correlate with serum PSA. These are relatively stable when compared with the prior exam. Hilar and mediastinal lymph nodes likely reactive in nature and slightly decreased when compared with the prior exam. Aortic Atherosclerosis (ICD10-I70.0). Electronically Signed   By: Alcide Clever M.D.   On: 06/06/2023 19:32   DG Chest 2 View Result Date: 06/06/2023 CLINICAL DATA:  Shortness of breath. EXAM: CHEST - 2 VIEW COMPARISON:  04/19/2023. FINDINGS: Re-demonstration of left retrocardiac airspace opacity obscuring the left hemidiaphragm, descending thoracic aorta and blunting the left lateral costophrenic angle, suggesting combination of left lung atelectasis and/or  consolidation with pleural effusion. No significant interval change. There also diffuse reticulonodular opacities throughout bilateral lungs, similar to prior study and corresponds to underlying chronic interstitial lung disease. No acute consolidation or lung collapse. There is trace right pleural effusion as well, unchanged. Stable cardio-mediastinal silhouette. No acute osseous abnormalities. The soft tissues are within normal limits. IMPRESSION: *No significant interval change since the prior study. Redemonstration of left retrocardiac airspace opacity, likely combination of left lung atelectasis and/or consolidation with pleural effusion. Electronically Signed   By: Jules Schick M.D.   On: 06/06/2023 15:22     Labs:   Basic Metabolic Panel: Recent Labs  Lab 06/07/23 0250 06/08/23 0702 06/09/23 0442 06/10/23 0441 06/12/23 0450 06/13/23 0641  NA 133* 133* 136 137 138 139  K 4.6 4.7 5.1 4.9 4.4 4.4  CL 95* 97* 100 96* 93* 94*  CO2 26 27 33* 34* 37* 39*  GLUCOSE 90 114* 99 79 71 77  BUN 9 17 19 16 19 18   CREATININE 0.54* 0.49* 0.53* 0.38* 0.50* 0.44*  CALCIUM 9.4 8.9 9.3 9.3 9.0 9.2  MG 2.1 2.3 2.4 2.4  --  2.4  PHOS 4.6  --   --  2.9  --   --    GFR Estimated Creatinine Clearance: 79 mL/min (A) (by C-G formula based on SCr of 0.44 mg/dL (L)). Liver Function Tests: Recent Labs  Lab 06/06/23 1540 06/07/23 0250 06/09/23 0442  AST 66* 61* 50*  ALT 70* 68* 62*  ALKPHOS 152* 165* 110  BILITOT 0.9 1.1 0.5  PROT 7.1 7.4 5.9*  ALBUMIN 2.8* 2.6* 2.1*   No results for input(s): "LIPASE", "AMYLASE" in the last 168 hours. No results for input(s): "AMMONIA" in the last 168 hours. Coagulation profile No results for input(s): "INR", "PROTIME" in the last 168 hours.  CBC: Recent Labs  Lab 06/06/23 1540 06/07/23 0250 06/08/23 0702 06/09/23 0442 06/10/23 0441 06/12/23 0450 06/13/23 0641  WBC 10.7*   < > 15.3* 16.8* 13.0* 13.6* 13.1*  NEUTROABS 7.6  --   --   --   --   --    --   HGB 12.6*   < > 11.7* 11.2* 11.3* 11.3* 11.7*  HCT 39.4   < > 36.3* 35.1* 36.0* 35.6* 37.2*  MCV 86.6   < > 86.8 88.0 87.6 88.1 87.9  PLT 272   < > 248 272 267 289 293   < > = values in this interval not displayed.   Cardiac Enzymes: No results for input(s): "CKTOTAL", "CKMB", "CKMBINDEX", "TROPONINI" in the last 168 hours. BNP: Invalid input(s): "POCBNP" CBG: No results for input(s): "GLUCAP" in the last 168 hours. D-Dimer No results for input(s): "DDIMER" in the last 72 hours. Hgb A1c No results for input(s): "HGBA1C" in the last 72 hours. Lipid Profile No results for input(s): "CHOL", "HDL", "LDLCALC", "TRIG", "CHOLHDL", "LDLDIRECT" in the last 72 hours. Thyroid function studies No results for input(s): "TSH", "T4TOTAL", "T3FREE", "THYROIDAB" in the last 72 hours.  Invalid input(s): "FREET3" Anemia work up No results for input(s): "VITAMINB12", "FOLATE", "FERRITIN", "TIBC", "IRON", "RETICCTPCT" in the last 72 hours. Microbiology Recent Results (from the past 240 hours)  Resp panel by RT-PCR (RSV, Flu A&B, Covid) Anterior Nasal Swab     Status: None   Collection Time: 06/06/23  3:28 PM   Specimen: Anterior Nasal Swab  Result Value Ref Range Status   SARS Coronavirus 2 by RT PCR NEGATIVE NEGATIVE Final    Comment: (NOTE) SARS-CoV-2 target nucleic acids are NOT DETECTED.  The SARS-CoV-2 RNA is generally detectable in upper respiratory specimens during the acute phase of infection. The lowest concentration of SARS-CoV-2 viral copies this assay can detect is 138 copies/mL. A negative result does not preclude SARS-Cov-2 infection and should not be used as the sole basis for treatment or other patient management decisions. A negative result may occur with  improper specimen collection/handling, submission of specimen other than nasopharyngeal swab, presence of viral mutation(s) within the areas targeted by this assay, and inadequate number of viral copies(<138 copies/mL).  A negative result must be combined with clinical observations, patient history, and epidemiological information. The expected result is Negative.  Fact Sheet for Patients:  BloggerCourse.com  Fact Sheet for Healthcare Providers:  SeriousBroker.it  This test is no t yet approved or cleared by the [place] FDA and  has been authorized for detection and/or diagnosis of SARS-CoV-2 by FDA under an Emergency Use Authorization (EUA). This EUA will remain  in effect (meaning this test can be used) for the duration of the COVID-19 declaration under Section 564(b)(1) of the Act, 21 U.S.C.section 360bbb-3(b)(1), unless the authorization is terminated  or revoked sooner.       Influenza A by PCR NEGATIVE NEGATIVE Final   Influenza B by PCR NEGATIVE NEGATIVE Final    Comment: (NOTE) The Xpert Xpress SARS-CoV-2/FLU/RSV plus assay is intended as an aid in the diagnosis of influenza from Nasopharyngeal swab specimens and should not be  used as a sole basis for treatment. Nasal washings and aspirates are unacceptable for Xpert Xpress SARS-CoV-2/FLU/RSV testing.  Fact Sheet for Patients: BloggerCourse.com  Fact Sheet for Healthcare Providers: SeriousBroker.it  This test is not yet approved or cleared by the [place] FDA and has been authorized for detection and/or diagnosis of SARS-CoV-2 by FDA under an Emergency Use Authorization (EUA). This EUA will remain in effect (meaning this test can be used) for the duration of the COVID-19 declaration under Section 564(b)(1) of the Act, 21 U.S.C. section 360bbb-3(b)(1), unless the authorization is terminated or revoked.     Resp Syncytial Virus by PCR NEGATIVE NEGATIVE Final    Comment: (NOTE) Fact Sheet for Patients: BloggerCourse.com  Fact Sheet for Healthcare  Providers: SeriousBroker.it  This test is not yet approved or cleared by the [place] FDA and has been authorized for detection and/or diagnosis of SARS-CoV-2 by FDA under an Emergency Use Authorization (EUA). This EUA will remain in effect (meaning this test can be used) for the duration of the COVID-19 declaration under Section 564(b)(1) of the Act, 21 U.S.C. section 360bbb-3(b)(1), unless the authorization is terminated or revoked.  Performed at [facility], [address], [place], [place] [address]   MRSA Next Gen by PCR, Nasal     Status: None   Collection Time: 06/06/23  8:23 PM   Specimen: Nasal Mucosa; Nasal Swab  Result Value Ref Range Status   MRSA by PCR Next Gen NOT DETECTED NOT DETECTED Final    Comment: (NOTE) The GeneXpert MRSA Assay (FDA approved for NASAL specimens only), is one component of a comprehensive MRSA colonization surveillance program. It is not intended to diagnose MRSA infection nor to guide or monitor treatment for MRSA infections. Test performance is not FDA approved in patients less than 52 years old. Performed at [facility], [address], [place], [place] [address]   Blood culture (routine x 2)     Status: None   Collection Time: 06/06/23  8:44 PM   Specimen: BLOOD RIGHT HAND  Result Value Ref Range Status   Specimen Description BLOOD RIGHT HAND  Final   Special Requests   Final    BOTTLES DRAWN AEROBIC AND ANAEROBIC Blood Culture adequate volume   Culture   Final    NO GROWTH 5 DAYS Performed at [facility], [address] [place], [place] [address]    Report Status 06/11/2023 FINAL  Final  Blood culture (routine x 2)     Status: None   Collection Time: 06/06/23  8:51 PM   Specimen: Left Antecubital; Blood  Result Value Ref Range Status   Specimen Description LEFT ANTECUBITAL  Final   Special Requests   Final    BOTTLES DRAWN AEROBIC AND ANAEROBIC Blood Culture adequate volume   Culture   Final    NO  GROWTH 5 DAYS Performed at [facility], [address] [place], [place] [address]    Report Status 06/11/2023 FINAL  Final  Culture, blood (routine x 2) Call MD if unable to obtain prior to antibiotics being given     Status: None   Collection Time: 06/07/23  2:48 AM   Specimen: BLOOD RIGHT HAND  Result Value Ref Range Status   Specimen Description BLOOD RIGHT HAND  Final   Special Requests   Final    BOTTLES DRAWN AEROBIC AND ANAEROBIC Blood Culture results may not be optimal due to an inadequate volume of blood received in culture bottles   Culture   Final  NO GROWTH 5 DAYS Performed at [facility], [address] [place], [place] [address]    Report Status 06/12/2023 FINAL  Final  Culture, blood (routine x 2) Call MD if unable to obtain prior to antibiotics being given     Status: None   Collection Time: 06/07/23  2:49 AM   Specimen: BLOOD  Result Value Ref Range Status   Specimen Description BLOOD LEFT ANTECUBITAL  Final   Special Requests   Final    BOTTLES DRAWN AEROBIC AND ANAEROBIC Blood Culture adequate volume   Culture   Final    NO GROWTH 5 DAYS Performed at [facility], [address], [place], [place] [address]    Report Status 06/12/2023 FINAL  Final     Discharge Instructions:   Discharge Instructions     Ambulatory referral to Hematology / Oncology   Complete by: As directed    Call MD for:  difficulty breathing, headache or visual disturbances   Complete by: As directed    Call MD for:  severe uncontrolled pain   Complete by: As directed    Call MD for:  temperature >100.4   Complete by: As directed    Diet general   Complete by: As directed    Discharge instructions   Complete by: As directed    Follow-up with your primary care provider in 1 week.  Continue oxygen at home.  Follow-up with oncology at [facility] when scheduled by the clinic.  Follow up with Dr Vassie Loll, pulmonary in 2-3 weeks or as scheduled by the clinic. Seek  medical attention for worsening symptoms.  No overexertion.   Increase activity slowly   Complete by: As directed       Allergies as of 06/13/2023   No Known Allergies      Medication List     STOP taking these medications    doxycycline 100 MG tablet Commonly known as: ADOXA       TAKE these medications    albuterol 108 (90 Base) MCG/ACT inhaler Commonly known as: VENTOLIN HFA Inhale 2 puffs into the lungs every 6 (six) hours as needed for wheezing or shortness of breath.   amLODipine 5 MG tablet Commonly known as: NORVASC Take 1 tablet (5 mg total) by mouth daily.   atorvastatin 80 MG tablet Commonly known as: LIPITOR TAKE 1 TABLET EVERY DAY AT 6 PM (APPOINTMENT IS NEEDED)   azaTHIOprine 50 MG tablet Commonly known as: IMURAN TAKE 3 TABLETS EVERY DAY   clobetasol 0.05 % external solution Commonly known as: TEMOVATE Apply 1 Application topically 2 (two) times daily. What changed:  when to take this reasons to take this   Clobetasol Propionate E 0.05 % emollient cream Generic drug: Clobetasol Prop Emollient Base APPLY TO AFFECTED AREA TWICE A DAY AS NEEDED   clopidogrel 75 MG tablet Commonly known as: PLAVIX Take 1 tablet (75 mg total) by mouth daily.   dextromethorphan-guaiFENesin 30-600 MG 12hr tablet Commonly known as: MUCINEX DM Take 1 tablet by mouth 2 (two) times daily for 7 days.   docusate sodium 100 MG capsule Commonly known as: COLACE Take 1 capsule (100 mg total) by mouth daily.   Eliquis 5 MG Tabs tablet Generic drug: apixaban TAKE 1 TABLET TWICE DAILY   feeding supplement Liqd Take 237 mLs by mouth 2 (two) times daily between meals.   furosemide 40 MG tablet Commonly known as: LASIX TAKE 1 TABLET TWICE DAILY   losartan 100 MG  tablet Commonly known as: COZAAR TAKE 1 TABLET EVERY DAY   Orencia ClickJect 125 MG/ML Soaj Generic drug: Abatacept Inject 125 mg into the skin once a week.   pantoprazole 40 MG tablet Commonly known  as: PROTONIX TAKE 1 TABLET TWICE DAILY   potassium chloride SA 20 MEQ tablet Commonly known as: KLOR-CON M TAKE 2 TABLETS TWICE DAILY   predniSONE 20 MG tablet Commonly known as: DELTASONE Take 2 tablets (40 mg total) by mouth daily with breakfast for 2 days, THEN 1.5 tablets (30 mg total) daily with breakfast for 2 days, THEN 1 tablet (20 mg total) daily with breakfast for 2 days, THEN 0.5 tablets (10 mg total) daily with breakfast for 2 days. Start taking on: June 14, 2023 What changed: You were already taking a medication with the same name, and this prescription was added. Make sure you understand how and when to take each.   predniSONE 5 MG tablet Commonly known as: DELTASONE Take 1 tablet (5 mg total) by mouth daily with breakfast. Start taking on: June 22, 2023 What changed: These instructions start on June 22, 2023. If you are unsure what to do until then, ask your doctor or other care provider.   sotalol 120 MG tablet Commonly known as: BETAPACE TAKE 1 TABLET EVERY 12 HOURS               Durable Medical Equipment  (From admission, onward)           Start     Ordered   06/13/23 0843  For home use only DME oxygen  Once       Question Answer Comment  Length of Need Lifetime   Mode or (Route) Nasal cannula   Liters per Minute 2   Oxygen conserving device Yes   Oxygen delivery system Gas      06/13/23 0842            Follow-up Information     Ladora Daniel, PA-C Follow up in 1 week(s).   Specialty: Physician Assistant Contact information: [address] [place] [address] [phone]         Oretha Milch, MD Follow up in 2 week(s).   Specialty: Pulmonary Disease Contact information: [address] [place] [address] [phone]         Doreatha Massed, MD Follow up.   Specialty: Hematology Why: as scheduled by the clinic for lung cancer Contact information: [address] [place]  [address] [phone]                  Time coordinating discharge: 39 minutes  Signed:  Uzziah Rigg  [facility] 06/13/2023, 1:45 PM

## 2023-06-13 NOTE — Plan of Care (Signed)
  Problem: Activity: Goal: Ability to tolerate increased activity will improve Outcome: Adequate for Discharge   Problem: Clinical Measurements: Goal: Ability to maintain a body temperature in the normal range will improve Outcome: Adequate for Discharge   Problem: Respiratory: Goal: Ability to maintain adequate ventilation will improve Outcome: Adequate for Discharge Goal: Ability to maintain a clear airway will improve Outcome: Adequate for Discharge   Problem: Education: Goal: Knowledge of General Education information will improve Description: Including pain rating scale, medication(s)/side effects and non-pharmacologic comfort measures Outcome: Adequate for Discharge   Problem: Health Behavior/Discharge Planning: Goal: Ability to manage health-related needs will improve Outcome: Adequate for Discharge   Problem: Clinical Measurements: Goal: Ability to maintain clinical measurements within normal limits will improve Outcome: Adequate for Discharge Goal: Will remain free from infection Outcome: Adequate for Discharge Goal: Diagnostic test results will improve Outcome: Adequate for Discharge Goal: Respiratory complications will improve Outcome: Adequate for Discharge Goal: Cardiovascular complication will be avoided Outcome: Adequate for Discharge   Problem: Activity: Goal: Risk for activity intolerance will decrease Outcome: Adequate for Discharge   Problem: Nutrition: Goal: Adequate nutrition will be maintained Outcome: Adequate for Discharge   Problem: Coping: Goal: Level of anxiety will decrease Outcome: Adequate for Discharge   Problem: Elimination: Goal: Will not experience complications related to bowel motility Outcome: Adequate for Discharge Goal: Will not experience complications related to urinary retention Outcome: Adequate for Discharge   Problem: Pain Managment: Goal: General experience of comfort will improve and/or be controlled Outcome:  Adequate for Discharge   Problem: Safety: Goal: Ability to remain free from injury will improve Outcome: Adequate for Discharge   Problem: Skin Integrity: Goal: Risk for impaired skin integrity will decrease Outcome: Adequate for Discharge

## 2023-06-13 NOTE — Progress Notes (Signed)
 Mobility Specialist Progress Note:    06/13/23 0908  Therapy Vitals  Temp 98.4 F (36.9 C)  Pulse Rate 63  Resp 18  BP (!) 134/56  Patient Position (if appropriate) Sitting  Oxygen Therapy  SpO2 99 %  O2 Device Nasal Cannula  O2 Flow Rate (L/min) 2 L/min  Mobility  Activity Ambulated independently in hallway  Level of Assistance Standby assist, set-up cues, supervision of patient - no hands on  Assistive Device None  Distance Ambulated (ft) 130 ft  Activity Response Tolerated well  Mobility Referral Yes  Mobility visit 1 Mobility  Mobility Specialist Start Time (ACUTE ONLY) [id number]  Mobility Specialist Stop Time (ACUTE ONLY) [id number]  Mobility Specialist Time Calculation (min) (ACUTE ONLY) 14 min   Received pt in chair having no complaints and agreeable to mobility. VSS on 2L O2 throughout. Pt was asymptomatic throughout ambulation and returned to room w/o fault. Left in chair w/ call bell in reach and all needs met.   D'Vante Earlene Plater Mobility Specialist Please contact via Special educational needs teacher or Rehab office at [phone]

## 2023-06-16 ENCOUNTER — Inpatient Hospital Stay: Attending: Hematology | Admitting: Hematology

## 2023-06-16 DIAGNOSIS — C3492 Malignant neoplasm of unspecified part of left bronchus or lung: Secondary | ICD-10-CM | POA: Insufficient documentation

## 2023-06-16 DIAGNOSIS — Z87891 Personal history of nicotine dependence: Secondary | ICD-10-CM | POA: Insufficient documentation

## 2023-06-16 DIAGNOSIS — D72821 Monocytosis (symptomatic): Secondary | ICD-10-CM | POA: Insufficient documentation

## 2023-06-16 DIAGNOSIS — G4700 Insomnia, unspecified: Secondary | ICD-10-CM | POA: Insufficient documentation

## 2023-06-16 DIAGNOSIS — I700 Atherosclerosis of aorta: Secondary | ICD-10-CM | POA: Insufficient documentation

## 2023-06-16 DIAGNOSIS — C7951 Secondary malignant neoplasm of bone: Secondary | ICD-10-CM | POA: Insufficient documentation

## 2023-06-16 DIAGNOSIS — Z8249 Family history of ischemic heart disease and other diseases of the circulatory system: Secondary | ICD-10-CM | POA: Insufficient documentation

## 2023-06-16 DIAGNOSIS — M069 Rheumatoid arthritis, unspecified: Secondary | ICD-10-CM | POA: Insufficient documentation

## 2023-06-16 DIAGNOSIS — R42 Dizziness and giddiness: Secondary | ICD-10-CM | POA: Insufficient documentation

## 2023-06-16 DIAGNOSIS — R0602 Shortness of breath: Secondary | ICD-10-CM | POA: Insufficient documentation

## 2023-06-16 DIAGNOSIS — E785 Hyperlipidemia, unspecified: Secondary | ICD-10-CM | POA: Insufficient documentation

## 2023-06-16 DIAGNOSIS — I255 Ischemic cardiomyopathy: Secondary | ICD-10-CM | POA: Insufficient documentation

## 2023-06-16 DIAGNOSIS — Z79899 Other long term (current) drug therapy: Secondary | ICD-10-CM | POA: Insufficient documentation

## 2023-06-16 DIAGNOSIS — I2510 Atherosclerotic heart disease of native coronary artery without angina pectoris: Secondary | ICD-10-CM | POA: Insufficient documentation

## 2023-06-16 DIAGNOSIS — I10 Essential (primary) hypertension: Secondary | ICD-10-CM | POA: Insufficient documentation

## 2023-06-16 DIAGNOSIS — R059 Cough, unspecified: Secondary | ICD-10-CM | POA: Insufficient documentation

## 2023-06-16 DIAGNOSIS — G479 Sleep disorder, unspecified: Secondary | ICD-10-CM | POA: Insufficient documentation

## 2023-06-16 NOTE — Progress Notes (Incomplete)
 [facility] [address] [place] [address]   Clinic Day:  06/16/2023  Referring physician: Ladora Daniel, PA-C  Patient Care Team: Ladora Daniel, PA-C as PCP - General (Physician Assistant) Runell Gess, MD as PCP - Cardiology (Cardiology) Oretha Milch, MD as Consulting Physician (Pulmonary Disease) Exie Parody, MD as Consulting Physician (Hematology and Oncology) [name], MD as Consulting Physician (Rheumatology) Joette Catching, MD as Attending Physician (Family Medicine) Jena Gauss Gerrit Friends, MD as Consulting Physician (Gastroenterology)   ASSESSMENT & PLAN:   Assessment: ***  Plan: ***  No orders of the defined types were placed in this encounter.     Alben Deeds Teague,acting as a Neurosurgeon for Doreatha Massed, MD.,have documented all relevant documentation on the behalf of Doreatha Massed, MD,as directed by  Doreatha Massed, MD while in the presence of Doreatha Massed, MD.   ***  [place] R Teague   4/14/20259:44 AM  CHIEF COMPLAINT/PURPOSE OF CONSULT:   Diagnosis: ***  Cancer Staging  No matching staging information was found for the patient.    Prior Therapy: ***  Current Therapy:  ***   HISTORY OF PRESENT ILLNESS:   Oncology History   No history exists.      Quantae is a 68 y.o. 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   Patient was admitted to the hospital from 06/07/23 to 06/13/23 for pneumonia with bilateral pleural effusions. He was treated with azithromycin and cefepime while hospitalized. Thoracentesis was done on 06/07/23 with left lung collapse during the procedure. The pleural  fluid cytology showed: adenocarcinoma with the malignant cells positive for Napsin A, TTF-1 and cytokeratin 7.   CT chest from 05/26/23 showed: Pulmonary parenchymal pattern of fibrosis. Persistent superimposed patchy ground-glass and consolidation, progressive in the left lower lobe, worrisome for occult malignancy. Highly loculated moderate left pleural effusion in simple right pleural effusion. Diffuse osseous metastatic disease, suggestive of prostate cancer. Aortic atherosclerosis.  Enlarged pulmonic trunk, indicative of pulmonary arterial hypertension.   CT A/P from 06/12/23 showed: Diffuse sclerotic bony metastases as above. Extensive involvement of the bilateral hips may place the patient at risk for fracture. In a male patient of this age, prostate cancer is the diagnosis of exclusion. Indeterminate left adrenal lesion, new since 10/21/2021 chest CT. Metastatic disease cannot be excluded. Indeterminate subcentimeter hypodensity within the right lobe liver, too small to characterize.  Today, he states that he is doing well overall. His appetite level is at ***%. His energy level is at ***%.  PAST MEDICAL HISTORY:   Past Medical History: Past Medical History:  Diagnosis Date   Abnormal CT scan, stomach    Chronic systolic dysfunction of left ventricle    Coronary artery disease    lateral STEMI 02/22/15 3/10 cutting balloon to ISR of the o/pLCX   GERD (gastroesophageal reflux disease)    Hyperlipidemia    Hypertension    Ischemic cardiomyopathy    LBBB (left bundle branch block)    Leukocytosis    followed by hematology, reactive   Lymphadenopathy    Myocardial infarction (HCC) 02/2015   Psoriasis 2003   Psoriatic arthritis (HCC)    Pulmonary fibrosis (HCC)    Rheumatoid arthritis(714.0) 2012   Typical  atrial flutter [facility])     Surgical History: Past Surgical History:  Procedure Laterality Date   CARDIAC CATHETERIZATION N/A 02/22/2015   Procedure: Left Heart Cath and Coronary  Angiography;  Surgeon: Runell Gess, MD;  Location: [facility] INVASIVE CV LAB;  Service: Cardiovascular;  Laterality: N/A;   CARDIAC CATHETERIZATION N/A 02/22/2015   Procedure: Coronary Stent Intervention;  Surgeon: Runell Gess, MD;  Location: [facility] INVASIVE CV LAB;  Service: Cardiovascular;  Laterality: N/A;   CARDIOVERSION N/A 04/12/2015   Procedure: CARDIOVERSION;  Surgeon: Laurey Morale, MD;  Location: [facility] ENDOSCOPY;  Service: Cardiovascular;  Laterality: N/A;   COLONOSCOPY  07/01/2003   HYQ:MVHQIO colonic mucosa except for the proximal right colon in the area of ICV which was not seen completely due to inadequate bowel prep. followed with ACBE which was normal.    COLONOSCOPY N/A 08/24/2015   Dr. Jena Gauss: Normal colon. Next colonoscopy in 10 years.   CORONARY BALLOON ANGIOPLASTY N/A 05/12/2018   Procedure: CORONARY BALLOON ANGIOPLASTY;  Surgeon: Lyn Records, MD;  Location: [facility] INVASIVE CV LAB;  Service: Cardiovascular;  Laterality: N/A;   ELECTROPHYSIOLOGIC STUDY N/A 05/30/2015   Atrial fibrillation ablation by Dr Johney Frame   ESOPHAGOGASTRODUODENOSCOPY N/A 08/24/2015   Dr. Jena Gauss: Medium-sized hiatal hernia, erosive gastropathy. Cameron lesions. Esophageal mucosa distally suggestive of short segment Barrett's esophagus. Not confirmed on biopsy. Gastric biopsy with minimal chronic inflammation   GIVENS CAPSULE STUDY N/A 04/17/2016   Procedure: GIVENS CAPSULE STUDY;  Surgeon: Corbin Ade, MD;  Location: [facility] ENDO SUITE;  Service: Endoscopy;  Laterality: N/A;  Pt to arrive at 8:00 am for 8:30 am appt   LEFT HEART CATH AND CORONARY ANGIOGRAPHY N/A 05/11/2018   Procedure: LEFT HEART CATH AND CORONARY ANGIOGRAPHY;  Surgeon: Lyn Records, MD;  Location: [facility] INVASIVE CV LAB;  Service: Cardiovascular;  Laterality: N/A;   TEE WITHOUT CARDIOVERSION N/A 04/12/2015   Procedure: TRANSESOPHAGEAL ECHOCARDIOGRAM (TEE);  Surgeon: Laurey Morale, MD;  Location: [facility] ENDOSCOPY;  Service: Cardiovascular;  Laterality: N/A;     Social History: Social History   Socioeconomic History   Marital status: Married    Spouse name: Not on file   Number of children: 2   Years of education: Not on file   Highest education level: Not on file  Occupational History   Occupation: unemployed    Comment: not working do to arthritis; used to be Production designer, theatre/television/film for a [employer], systems  Tobacco Use   Smoking status: Former    Current packs/day: 0.00    Average packs/day: 1 pack/day for 30.0 years (30.0 ttl pk-yrs)    Types: Cigarettes    Start date: 03/20/1973    Quit date: 03/21/2003    Years since quitting: 20.2    Passive exposure: Past   Smokeless tobacco: Never  Vaping Use   Vaping status: Never Used  Substance and Sexual Activity   Alcohol use: No    Alcohol/week: 0.0 standard drinks of alcohol    Comment: H/O case of beer weekly x 20 years, quiting in 2000-ish.   Drug use: No    Comment: H/O marijuana use many years ago.   Sexual activity: Yes    Birth control/protection: None  Other Topics Concern   Not on file  Social History Narrative   Not on file   Social Drivers of Health   Financial Resource Strain: Low Risk  (04/29/2023)   Received from [facility]   Overall Financial Resource Strain (CARDIA)    Difficulty  of Paying Living Expenses: Not very hard  Food Insecurity: No Food Insecurity (06/07/2023)   Hunger Vital Sign    Worried About Running Out of Food in the Last Year: Never true    Ran Out of Food in the Last Year: Never true  Transportation Needs: No Transportation Needs (06/07/2023)   PRAPARE - Administrator, Civil Service (Medical): No    Lack of Transportation (Non-Medical): No  Physical Activity: Unknown (04/29/2023)   Received from [facility]   Exercise Vital Sign    Days of Exercise per Week: 0 days    Minutes of Exercise per Session: Not on file  Stress: No Stress Concern Present (04/29/2023)   Received from [facility]  - Occupational Stress Questionnaire    Feeling of Stress : Not at all  Social Connections: Patient Declined (06/07/2023)   Social Connection and Isolation Panel [NHANES]    Frequency of Communication with Friends and Family: Patient declined    Frequency of Social Gatherings with Friends and Family: Patient declined    Attends Religious Services: Patient declined    Database administrator or Organizations: Patient declined    Attends Banker Meetings: Patient declined    Marital Status: Patient declined  Intimate Partner Violence: Not At Risk (06/07/2023)   Humiliation, Afraid, Rape, and Kick questionnaire    Fear of Current or Ex-Partner: No    Emotionally Abused: No    Physically Abused: No    Sexually Abused: No    Family History: Family History  Problem Relation Age of Onset   Hypertension Mother    Colon cancer Neg Hx     Current Medications:  Current Outpatient Medications:    Abatacept (ORENCIA CLICKJECT) 125 MG/ML SOAJ, Inject 125 mg into the skin once a week. (Patient not taking: Reported on 04/08/2023), Disp: 12 mL, Rfl: 0   albuterol (VENTOLIN HFA) 108 (90 Base) MCG/ACT inhaler, Inhale 2 puffs into the lungs every 6 (six) hours as needed for wheezing or shortness of breath., Disp: 17 each, Rfl: 6   amLODipine (NORVASC) 5 MG tablet, Take 1 tablet (5 mg total) by mouth daily., Disp: 90 tablet, Rfl: 3   apixaban (ELIQUIS) 5 MG TABS tablet, TAKE 1 TABLET TWICE DAILY, Disp: 180 tablet, Rfl: 1   atorvastatin (LIPITOR) 80 MG tablet, TAKE 1 TABLET EVERY DAY AT 6 PM (APPOINTMENT IS NEEDED), Disp: 90 tablet, Rfl: 3   azaTHIOprine (IMURAN) 50 MG tablet, TAKE 3 TABLETS EVERY DAY, Disp: 270 tablet, Rfl: 0   clobetasol (TEMOVATE) 0.05 % external solution, Apply 1 Application topically 2 (two) times daily. (Patient taking differently: Apply 1 Application topically 2 (two) times daily as needed (Dermatitis).), Disp: 50 mL, Rfl: 0   Clobetasol Prop Emollient Base (CLOBETASOL  PROPIONATE E) 0.05 % emollient cream, APPLY TO AFFECTED AREA TWICE A DAY AS NEEDED, Disp: 30 g, Rfl: 2   clopidogrel (PLAVIX) 75 MG tablet, Take 1 tablet (75 mg total) by mouth daily., Disp: 90 tablet, Rfl: 3   dextromethorphan-guaiFENesin (MUCINEX DM) 30-600 MG 12hr tablet, Take 1 tablet by mouth 2 (two) times daily for 7 days., Disp: 14 tablet, Rfl: 0   docusate sodium (COLACE) 100 MG capsule, Take 1 capsule (100 mg total) by mouth daily., Disp: 30 capsule, Rfl: 0   feeding supplement (ENSURE ENLIVE / ENSURE PLUS) LIQD, Take 237 mLs by mouth 2 (two) times daily between meals., Disp: , Rfl:    furosemide (  LASIX) 40 MG tablet, TAKE 1 TABLET TWICE DAILY, Disp: 180 tablet, Rfl: 3   losartan (COZAAR) 100 MG tablet, TAKE 1 TABLET EVERY DAY, Disp: 90 tablet, Rfl: 3   pantoprazole (PROTONIX) 40 MG tablet, TAKE 1 TABLET TWICE DAILY, Disp: 180 tablet, Rfl: 1   potassium chloride SA (KLOR-CON M) 20 MEQ tablet, TAKE 2 TABLETS TWICE DAILY, Disp: 360 tablet, Rfl: 3   predniSONE (DELTASONE) 20 MG tablet, Take 2 tablets (40 mg total) by mouth daily with breakfast for 2 days, THEN 1.5 tablets (30 mg total) daily with breakfast for 2 days, THEN 1 tablet (20 mg total) daily with breakfast for 2 days, THEN 0.5 tablets (10 mg total) daily with breakfast for 2 days., Disp: 10 tablet, Rfl: 0   [START ON 06/22/2023] predniSONE (DELTASONE) 5 MG tablet, Take 1 tablet (5 mg total) by mouth daily with breakfast., Disp: , Rfl:    sotalol (BETAPACE) 120 MG tablet, TAKE 1 TABLET EVERY 12 HOURS, Disp: 180 tablet, Rfl: 3   Allergies: No Known Allergies  REVIEW OF SYSTEMS:   Review of Systems  Constitutional:  Negative for chills, fatigue and fever.  HENT:   Negative for lump/mass, mouth sores, nosebleeds, sore throat and trouble swallowing.   Eyes:  Negative for eye problems.  Respiratory:  Negative for cough and shortness of breath.   Cardiovascular:  Negative for chest pain, leg swelling and palpitations.   Gastrointestinal:  Negative for abdominal pain, constipation, diarrhea, nausea and vomiting.  Genitourinary:  Negative for bladder incontinence, difficulty urinating, dysuria, frequency, hematuria and nocturia.   Musculoskeletal:  Negative for arthralgias, back pain, flank pain, myalgias and neck pain.  Skin:  Negative for itching and rash.  Neurological:  Negative for dizziness, headaches and numbness.  Hematological:  Does not bruise/bleed easily.  Psychiatric/Behavioral:  Negative for depression, sleep disturbance and suicidal ideas. The patient is not nervous/anxious.   All other systems reviewed and are negative.    VITALS:   There were no vitals taken for this visit.  Wt Readings from Last 3 Encounters:  06/09/23 158 lb 8.2 oz (71.9 kg)  06/04/23 158 lb 9.6 oz (71.9 kg)  04/08/23 164 lb (74.4 kg)    There is no height or weight on file to calculate BMI.  Performance status (ECOG): {CHL ONC [id number]  PHYSICAL EXAM:   Physical Exam Vitals and nursing note reviewed. Exam conducted with a chaperone present.  Constitutional:      Appearance: Normal appearance.  Cardiovascular:     Rate and Rhythm: Normal rate and regular rhythm.     Pulses: Normal pulses.     Heart sounds: Normal heart sounds.  Pulmonary:     Effort: Pulmonary effort is normal.     Breath sounds: Normal breath sounds.  Abdominal:     Palpations: Abdomen is soft. There is no hepatomegaly, splenomegaly or mass.     Tenderness: There is no abdominal tenderness.  Musculoskeletal:     Right lower leg: No edema.     Left lower leg: No edema.  Lymphadenopathy:     Cervical: No cervical adenopathy.     Right cervical: No superficial, deep or posterior cervical adenopathy.    Left cervical: No superficial, deep or posterior cervical adenopathy.     Upper Body:     Right upper body: No supraclavicular or axillary adenopathy.     Left upper body: No supraclavicular or axillary adenopathy.  Neurological:      General: No focal deficit present.  Mental Status: He is alert and oriented to person, place, and time.  Psychiatric:        Mood and Affect: Mood normal.        Behavior: Behavior normal.     LABS:   CBC    Component Value Date/Time   WBC 13.1 (H) 06/13/2023 0641   RBC 4.23 06/13/2023 0641   HGB 11.7 (L) 06/13/2023 0641   HGB 14.5 08/12/2011 1256   HCT 37.2 (L) 06/13/2023 0641   HCT 44.7 08/12/2011 1256   PLT 293 06/13/2023 0641   PLT 240 08/12/2011 1256   MCV 87.9 06/13/2023 0641   MCV 82.2 08/12/2011 1256   MCH 27.7 06/13/2023 0641   MCHC 31.5 06/13/2023 0641   RDW 18.6 (H) 06/13/2023 0641   RDW 14.7 (H) 08/12/2011 1256   LYMPHSABS 1.4 06/06/2023 1540   LYMPHSABS 5.5 (H) 08/12/2011 1256   MONOABS 1.6 (H) 06/06/2023 1540   MONOABS 1.6 (H) 08/12/2011 1256   EOSABS 0.0 06/06/2023 1540   EOSABS 0.2 08/12/2011 1256   BASOSABS 0.0 06/06/2023 1540   BASOSABS 0.1 08/12/2011 1256    CMP    Component Value Date/Time   NA 139 06/13/2023 0641   K 4.4 06/13/2023 0641   CL 94 (L) 06/13/2023 0641   CO2 39 (H) 06/13/2023 0641   GLUCOSE 77 06/13/2023 0641   BUN 18 06/13/2023 0641   CREATININE 0.44 (L) 06/13/2023 0641   CREATININE 0.68 (L) 05/25/2021 1021   CALCIUM 9.2 06/13/2023 0641   PROT 5.9 (L) 06/09/2023 0442   ALBUMIN 2.1 (L) 06/09/2023 0442   AST 50 (H) 06/09/2023 0442   ALT 62 (H) 06/09/2023 0442   ALKPHOS 110 06/09/2023 0442   BILITOT 0.5 06/09/2023 0442   GFRNONAA >60 06/13/2023 0641   GFRNONAA 98 03/14/2020 1304   GFRAA 114 03/14/2020 1304     No results found for: "CEA1", "CEA" / No results found for: "CEA1", "CEA" No results found for: "PSA1" No results found for: "DGU440" No results found for: "HKV425"  Lab Results  Component Value Date   TOTALPROTELP 7.1 09/19/2016   ALBUMINELP 3.5 (L) 09/19/2016   A1GS 0.4 (H) 09/19/2016   A2GS 0.9 09/19/2016   BETS 0.5 09/19/2016   BETA2SER 0.5 09/19/2016   GAMS 1.3 09/19/2016   SPEI   09/19/2016    Lab Results  Component Value Date   TIBC 213 (L) 07/17/2016   TIBC 202 (L) 01/19/2016   FERRITIN 188 01/28/2018   FERRITIN 444 (H) 07/17/2016   FERRITIN 245 01/19/2016   IRONPCTSAT 15 07/17/2016   IRONPCTSAT 9 (L) 01/19/2016   Lab Results  Component Value Date   LDH 205 (H) 03/25/2023   LDH 151 03/19/2022   LDH 170 03/20/2021     STUDIES:   CT ABDOMEN PELVIS W CONTRAST Result Date: 06/12/2023 CLINICAL DATA:  History of pneumonia and pleural effusion, suspected bony metastasis on prior chest CT, assess for metastatic disease EXAM: CT ABDOMEN AND PELVIS WITH CONTRAST TECHNIQUE: Multidetector CT imaging of the abdomen and pelvis was performed using the standard protocol following bolus administration of intravenous contrast. RADIATION DOSE REDUCTION: This exam was performed according to the departmental dose-optimization program which includes automated exposure control, adjustment of the mA and/or kV according to patient size and/or use of iterative reconstruction technique. CONTRAST:  75mL OMNIPAQUE IOHEXOL 350 MG/ML SOLN COMPARISON:  06/06/2023 FINDINGS: Lower chest: Stable bibasilar fibrosis and left lower lobe consolidation. Stable small bilateral pleural effusions, partially loculated on the left. Hepatobiliary:  Indeterminate 0.8 cm hypodensity within the right lobe liver image 16/3. Otherwise no focal liver abnormality. No gallstones, gallbladder wall thickening, or biliary dilatation. Pancreas: Unremarkable. No pancreatic ductal dilatation or surrounding inflammatory changes. Spleen: Normal in size without focal abnormality. Adrenals/Urinary Tract: There is a 1.5 cm indeterminate left adrenal lesion, measuring 49 HU, new since the 09/20/2021 chest CT. Right adrenal is unremarkable. Minimal areas of bilateral renal cortical scarring are noted. Otherwise the kidneys enhance normally and symmetrically. No urinary tract calculi or obstructive uropathy. Bladder is unremarkable.  Stomach/Bowel: No bowel obstruction or ileus. Normal appendix right lower quadrant. No bowel wall thickening or inflammatory change. Small hiatal hernia. Vascular/Lymphatic: Diffuse atherosclerosis of the aorta and its branches. High-grade stenoses of the bilateral common and external iliac arteries, right greater than left. No pathologic adenopathy within the abdomen or pelvis. Reproductive: The prostate is not enlarged. Other: No free fluid or free intraperitoneal gas. No abdominal wall hernia. Musculoskeletal: Diffuse sclerotic lesions are seen throughout the visualized thoracolumbar spine, thoracic cage, bony pelvis, and bilateral hips, compatible with diffuse bony metastases. The extensive involvement of the intertrochanteric regions of the bilateral hips may place the patient at risk for fracture. No acute or pathologic fractures are evident on this exam. Reconstructed images demonstrate no additional findings. IMPRESSION: 1. Diffuse sclerotic bony metastases as above. Extensive involvement of the bilateral hips may place the patient at risk for fracture. In a male patient of this age, prostate cancer is the diagnosis of exclusion. 2. Indeterminate left adrenal lesion, new since 10/21/2021 chest CT. Metastatic disease cannot be excluded. 3. Indeterminate subcentimeter hypodensity within the right lobe liver, too small to characterize. 4. Aortic Atherosclerosis (ICD10-I70.0). High-grade stenoses are seen within the bilateral common and external iliac distributions. 5. Stable findings at the lung bases, with bilateral effusions, bibasilar fibrosis, left basilar consolidation again noted. Please see recent CT chest report. Electronically Signed   By: Bobbye Burrow M.D.   On: 06/12/2023 16:15   CT Chest High Resolution Result Date: 06/11/2023 CLINICAL DATA:  Diffuse/interstitial lung disease. EXAM: CT CHEST WITHOUT CONTRAST TECHNIQUE: Multidetector CT imaging of the chest was performed following the standard  protocol without intravenous contrast. High resolution imaging of the lungs, as well as inspiratory and expiratory imaging, was performed. RADIATION DOSE REDUCTION: This exam was performed according to the departmental dose-optimization program which includes automated exposure control, adjustment of the mA and/or kV according to patient size and/or use of iterative reconstruction technique. COMPARISON:  04/19/2023, 09/20/2021 and 09/12/2020. FINDINGS: Cardiovascular: Atherosclerotic calcification of the aorta, aortic valve and coronary arteries. Enlarged pulmonic trunk and heart. No pericardial effusion. Mediastinum/Nodes: Mediastinal lymph nodes measure up to 10 mm in the low right paratracheal station, similar. Hilar regions are difficult to definitively evaluate without IV contrast. No axillary adenopathy. Esophagus is grossly unremarkable. Lungs/Pleura: Image quality is degraded by respiratory motion. Patchy ground-glass and consolidation in the lungs bilaterally, with progressive consolidation in the anterior left lower lobe (7/82). Underlying patchy traction bronchiectasis, ground-glass and architectural distortion, as on 09/20/2021 with relative sparing of the subpleural regions. Highly loculated moderate left pleural effusion, as on 04/19/2023. Small right pleural effusion. Debris in the airway. Upper Abdomen: Visualized portions of the liver, gallbladder, adrenal glands, kidneys, spleen, pancreas, stomach and bowel are grossly unremarkable. No upper abdominal adenopathy. Musculoskeletal: Sclerotic lesions throughout the visualized osseous structures. IMPRESSION: 1. Pulmonary parenchymal pattern of fibrosis, as detailed above, grossly similar to prior exams. 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 Findings are suggestive  of an alternative diagnosis (not UIP) per consensus guidelines: Diagnosis of Idiopathic Pulmonary Fibrosis: An Official ATS/ERS/JRS/ALAT Clinical Practice Guideline.  Am Rosezetta Schlatter Crit Care Med Vol 198, Iss 5, [phone], Nov 02 2016. 2. Persistent superimposed patchy ground-glass and consolidation, progressive in the left lower lobe, worrisome for occult malignancy. 3. Highly loculated moderate left pleural effusion in simple right pleural effusion. 4. Diffuse osseous metastatic disease, suggestive of prostate cancer. 5. Aortic atherosclerosis (ICD10-I70.0). Coronary artery calcification. 6. Enlarged pulmonic trunk, indicative of pulmonary arterial hypertension. Electronically Signed   By: Leanna Battles M.D.   On: 06/11/2023 09:50   DG CHEST PORT 1 VIEW Result Date: 06/08/2023 CLINICAL DATA:  Lung collapse. EXAM: PORTABLE CHEST 1 VIEW COMPARISON:  June 07, 2023. FINDINGS: Stable cardiomediastinal silhouette. There is increased aeration of the left upper lobe compared to prior exam. There remains left lung opacity concerning for pneumonia or edema. Probable mild to moderate size left pleural effusion is noted. Right basilar atelectasis, edema or atelectasis is noted with small right pleural effusion. Bony thorax is unremarkable. IMPRESSION: Bilateral lung opacities as noted above. There does appear to be significantly increased aeration of left upper lobe compared to prior exam. Electronically Signed   By: Lupita Raider M.D.   On: 06/08/2023 08:11   DG Chest 1 View Addendum Date: 06/07/2023 ADDENDUM REPORT: 06/07/2023 12:24 ADDENDUM: Study discussed by telephone with Dr. Tyson Babinski on 06/07/2023 at 1214 hours. Electronically Signed   By: Odessa Fleming M.D.   On: 06/07/2023 12:24   Result Date: 06/07/2023 CLINICAL DATA:  68 year old male status post ultrasound-guided left side thoracentesis this morning. EXAM: CHEST  1 VIEW COMPARISON:  Chest CT yesterday. FINDINGS: AP view of the chest 1020 hours. White out of the left lung now, but slightly leftward shift of the mediastinum when compared to the CT scout view yesterday. No pneumothorax is identified. Right lung ventilation with patchy  and coarse opacity is stable. Visualized tracheal air column is within normal limits. Negative visible bowel gas. No acute osseous abnormality identified. IMPRESSION: 1. Abnormal white out of the left lung since yesterday, but with some leftward mediastinal shift since that time suggesting this is mucous plugging and lung collapse. 2. No pneumothorax is identified. Electronically Signed: By: Odessa Fleming M.D. On: 06/07/2023 11:57   US Abdomen Limited RUQ (LIVER/GB) Result Date: 06/07/2023 CLINICAL DATA:  Elevated liver function tests EXAM: ULTRASOUND ABDOMEN LIMITED RIGHT UPPER QUADRANT COMPARISON:  None Available. FINDINGS: Gallbladder: No gallstones or wall thickening visualized. No sonographic Murphy sign noted by sonographer. Common bile duct: Diameter: 5 mm Liver: No focal lesion identified. Within normal limits in parenchymal echogenicity. Portal vein is patent on color Doppler imaging with normal direction of blood flow towards the liver. Other: Small right pleural effusion. IMPRESSION: No gallstones or ductal dilatation. Small right pleural effusion. Please correlate with chest CT of 06/06/2023 Electronically Signed   By: Karen Kays M.D.   On: 06/07/2023 12:01   US THORACENTESIS ASP PLEURAL SPACE W/IMG GUIDE Result Date: 06/07/2023 INDICATION: 68 year old male. Recent history of pneumonia. Admitted for shortness of breath team is requesting a therapeutic and diagnostic left-sided thoracentesis EXAM: ULTRASOUND GUIDED THERAPEUTIC AND DIAGNOSTIC LEFT-SIDED THORACENTESIS MEDICATIONS: Lidocaine 1% 10 mL COMPLICATIONS: None immediate. PROCEDURE: An ultrasound guided thoracentesis was thoroughly discussed with the patient and questions answered. The benefits, risks, alternatives and complications were also discussed. The patient understands and wishes to proceed with the procedure. Written consent was obtained. Ultrasound was performed to localize and mark an  adequate pocket of fluid in the left chest. The area  was then prepped and draped in the normal sterile fashion. 1% Lidocaine was used for local anesthesia. Under ultrasound guidance a 6 Fr Safe-T-Centesis catheter was introduced. Thoracentesis was performed. The catheter was removed and a dressing applied. FINDINGS: A total of approximately 250 mL of amber color fluid was removed. Samples were sent to the laboratory as requested by the clinical team. IMPRESSION: Successful ultrasound guided therapeutic and diagnostic left-sided thoracentesis yielding 250 mL amber: pleural fluid. Performed by Reagan Camera NP Electronically Signed   By: Myrlene Asper D.O.   On: 06/07/2023 11:19   CT Chest W Contrast Result Date: 06/06/2023 CLINICAL DATA:  Follow-up left-sided pleural effusion EXAM: CT CHEST WITH CONTRAST TECHNIQUE: Multidetector CT imaging of the chest was performed during intravenous contrast administration. RADIATION DOSE REDUCTION: This exam was performed according to the departmental dose-optimization program which includes automated exposure control, adjustment of the mA and/or kV according to patient size and/or use of iterative reconstruction technique. CONTRAST:  75mL OMNIPAQUE IOHEXOL 300 MG/ML  SOLN COMPARISON:  05/26/2023, 04/19/2023 FINDINGS: Cardiovascular: Atherosclerotic calcifications of the thoracic aorta are noted without aneurysmal dilatation or dissection. Heavy coronary calcifications are seen. The heart is at the upper limits of normal in size. No sizable central pulmonary emboli are noted. Mediastinum/Nodes: Thoracic inlet is within normal limits. Scattered likely reactive lymph nodes are noted in the mediastinum similar to that seen on the prior exam. The largest of these is in the subcarinal region measuring 12 mm slightly decreased when compared with the prior exam. The hilar nodes are also slightly decreased in size when compared with the prior study. The esophagus is air-filled and within normal limits. A small sliding-type hiatal  hernia is noted. Lungs/Pleura: Bilateral pleural effusions are again identified left worse than right with fluid trapped in the major fissure on the left. The right-sided effusion is stable when compared with 05/26/2023 but increased when compared with the prior exam from February of 2025. Persistent diffuse fibrotic changes are identified in both lungs with some area of adjacent consolidation particularly in the left lower lobe also stable from the prior study. No new parenchymal nodules or areas of consolidation are noted. Upper Abdomen: Visualized upper abdomen shows no acute abnormality. Some scarring in the right kidney is noted. Musculoskeletal: Multiple sclerotic lesions are again identified throughout the bony structures to include the ribcage, thoracic spine and proximal humeri bilaterally. Scapular and sternal lesions are noted as well. These are again highly suggestive of metastatic prostate disease. Correlation with PSA is recommended. IMPRESSION: Overall stable appearance of the pleural effusions bilaterally left greater than right. Persistent fibrotic change and left lower lobe consolidation is seen and stable. No new areas of consolidation are noted. Hiatal hernia. Extensive sclerotic osseous lesions highly suggestive of metastatic prostate carcinoma. Correlate with serum PSA. These are relatively stable when compared with the prior exam. Hilar and mediastinal lymph nodes likely reactive in nature and slightly decreased when compared with the prior exam. Aortic Atherosclerosis (ICD10-I70.0). Electronically Signed   By: Violeta Grey M.D.   On: 06/06/2023 19:32   DG Chest 2 View Result Date: 06/06/2023 CLINICAL DATA:  Shortness of breath. EXAM: CHEST - 2 VIEW COMPARISON:  04/19/2023. FINDINGS: Re-demonstration of left retrocardiac airspace opacity obscuring the left hemidiaphragm, descending thoracic aorta and blunting the left lateral costophrenic angle, suggesting combination of left lung  atelectasis and/or consolidation with pleural effusion. No significant interval change. There also diffuse reticulonodular opacities  throughout bilateral lungs, similar to prior study and corresponds to underlying chronic interstitial lung disease. No acute consolidation or lung collapse. There is trace right pleural effusion as well, unchanged. Stable cardio-mediastinal silhouette. No acute osseous abnormalities. The soft tissues are within normal limits. IMPRESSION: *No significant interval change since the prior study. Redemonstration of left retrocardiac airspace opacity, likely combination of left lung atelectasis and/or consolidation with pleural effusion. Electronically Signed   By: Beula Brunswick M.D.   On: 06/06/2023 15:22

## 2023-06-17 ENCOUNTER — Inpatient Hospital Stay: Admitting: Hematology

## 2023-06-18 ENCOUNTER — Encounter: Payer: Self-pay | Admitting: Hematology

## 2023-06-18 ENCOUNTER — Inpatient Hospital Stay

## 2023-06-18 ENCOUNTER — Ambulatory Visit (HOSPITAL_COMMUNITY)
Admission: RE | Admit: 2023-06-18 | Discharge: 2023-06-18 | Disposition: A | Discharge status: Home / Self Care | Source: Ambulatory Visit | Attending: Hematology | Admitting: Hematology

## 2023-06-18 ENCOUNTER — Inpatient Hospital Stay: Admitting: Hematology

## 2023-06-18 VITALS — BP 109/52 | HR 56 | Temp 97.5°F | Resp 18 | Ht 66.0 in | Wt 159.2 lb

## 2023-06-18 DIAGNOSIS — G4700 Insomnia, unspecified: Secondary | ICD-10-CM | POA: Diagnosis not present

## 2023-06-18 DIAGNOSIS — C7951 Secondary malignant neoplasm of bone: Secondary | ICD-10-CM | POA: Insufficient documentation

## 2023-06-18 DIAGNOSIS — Z87891 Personal history of nicotine dependence: Secondary | ICD-10-CM | POA: Diagnosis not present

## 2023-06-18 DIAGNOSIS — C3490 Malignant neoplasm of unspecified part of unspecified bronchus or lung: Secondary | ICD-10-CM | POA: Insufficient documentation

## 2023-06-18 DIAGNOSIS — Z8249 Family history of ischemic heart disease and other diseases of the circulatory system: Secondary | ICD-10-CM | POA: Diagnosis not present

## 2023-06-18 DIAGNOSIS — R0602 Shortness of breath: Secondary | ICD-10-CM | POA: Diagnosis not present

## 2023-06-18 DIAGNOSIS — R42 Dizziness and giddiness: Secondary | ICD-10-CM | POA: Diagnosis not present

## 2023-06-18 DIAGNOSIS — E785 Hyperlipidemia, unspecified: Secondary | ICD-10-CM | POA: Diagnosis not present

## 2023-06-18 DIAGNOSIS — D72821 Monocytosis (symptomatic): Secondary | ICD-10-CM | POA: Diagnosis present

## 2023-06-18 DIAGNOSIS — R059 Cough, unspecified: Secondary | ICD-10-CM | POA: Diagnosis not present

## 2023-06-18 DIAGNOSIS — I700 Atherosclerosis of aorta: Secondary | ICD-10-CM | POA: Diagnosis not present

## 2023-06-18 DIAGNOSIS — I2510 Atherosclerotic heart disease of native coronary artery without angina pectoris: Secondary | ICD-10-CM | POA: Diagnosis not present

## 2023-06-18 DIAGNOSIS — C3492 Malignant neoplasm of unspecified part of left bronchus or lung: Secondary | ICD-10-CM | POA: Diagnosis present

## 2023-06-18 DIAGNOSIS — I10 Essential (primary) hypertension: Secondary | ICD-10-CM | POA: Diagnosis not present

## 2023-06-18 DIAGNOSIS — M069 Rheumatoid arthritis, unspecified: Secondary | ICD-10-CM | POA: Diagnosis not present

## 2023-06-18 DIAGNOSIS — Z79899 Other long term (current) drug therapy: Secondary | ICD-10-CM | POA: Diagnosis not present

## 2023-06-18 DIAGNOSIS — G479 Sleep disorder, unspecified: Secondary | ICD-10-CM | POA: Diagnosis not present

## 2023-06-18 DIAGNOSIS — I255 Ischemic cardiomyopathy: Secondary | ICD-10-CM | POA: Diagnosis not present

## 2023-06-18 LAB — CBC WITH DIFFERENTIAL/PLATELET
Abs Immature Granulocytes: 0.14 10*3/uL — ABNORMAL HIGH (ref 0.00–0.07)
Basophils Absolute: 0 10*3/uL (ref 0.0–0.1)
Basophils Relative: 0 %
Eosinophils Absolute: 0 10*3/uL (ref 0.0–0.5)
Eosinophils Relative: 0 %
HCT: 38.5 % — ABNORMAL LOW (ref 39.0–52.0)
Hemoglobin: 11.8 g/dL — ABNORMAL LOW (ref 13.0–17.0)
Immature Granulocytes: 1 %
Lymphocytes Relative: 9 %
Lymphs Abs: 1 10*3/uL (ref 0.7–4.0)
MCH: 27.5 pg (ref 26.0–34.0)
MCHC: 30.6 g/dL (ref 30.0–36.0)
MCV: 89.7 fL (ref 80.0–100.0)
Monocytes Absolute: 0.3 10*3/uL (ref 0.1–1.0)
Monocytes Relative: 3 %
Neutro Abs: 10.2 10*3/uL — ABNORMAL HIGH (ref 1.7–7.7)
Neutrophils Relative %: 87 %
Platelets: 276 10*3/uL (ref 150–400)
RBC: 4.29 MIL/uL (ref 4.22–5.81)
RDW: 18.4 % — ABNORMAL HIGH (ref 11.5–15.5)
WBC: 11.7 10*3/uL — ABNORMAL HIGH (ref 4.0–10.5)
nRBC: 0 % (ref 0.0–0.2)

## 2023-06-18 LAB — COMPREHENSIVE METABOLIC PANEL WITH GFR
ALT: 46 U/L — ABNORMAL HIGH (ref 0–44)
AST: 43 U/L — ABNORMAL HIGH (ref 15–41)
Albumin: 2.6 g/dL — ABNORMAL LOW (ref 3.5–5.0)
Alkaline Phosphatase: 132 U/L — ABNORMAL HIGH (ref 38–126)
Anion gap: 14 (ref 5–15)
BUN: 16 mg/dL (ref 8–23)
CO2: 33 mmol/L — ABNORMAL HIGH (ref 22–32)
Calcium: 9.4 mg/dL (ref 8.9–10.3)
Chloride: 90 mmol/L — ABNORMAL LOW (ref 98–111)
Creatinine, Ser: 0.38 mg/dL — ABNORMAL LOW (ref 0.61–1.24)
GFR, Estimated: >60 mL/min (ref >60)
Glucose, Bld: 101 mg/dL — ABNORMAL HIGH (ref 70–99)
Potassium: 5.5 mmol/L — ABNORMAL HIGH (ref 3.5–5.1)
Sodium: 137 mmol/L (ref 135–145)
Total Bilirubin: 0.4 mg/dL (ref 0.0–1.2)
Total Protein: 6.3 g/dL — ABNORMAL LOW (ref 6.5–8.1)

## 2023-06-18 LAB — LACTATE DEHYDROGENASE: LDH: 256 U/L — ABNORMAL HIGH (ref 98–192)

## 2023-06-18 MED ORDER — TRAZODONE HCL 50 MG PO TABS: 50.0000 mg | ORAL_TABLET | Freq: Every evening | ORAL | 0 refills | Status: DC | PRN

## 2023-06-18 NOTE — Progress Notes (Signed)
 [facility] [address] [place] [address]   Clinic Day:  06/18/2023  Referring physician: Ladora Daniel, PA-C  Patient Care Team: Ladora Daniel, PA-C as PCP - General (Physician Assistant) Runell Gess, MD as PCP - Cardiology (Cardiology) Oretha Milch, MD as Consulting Physician (Pulmonary Disease) [name], MD as Consulting Physician (Rheumatology) Joette Catching, MD as Attending Physician (Family Medicine) Jena Gauss Gerrit Friends, MD as Consulting Physician (Gastroenterology) Doreatha Massed, MD as Medical Oncologist (Medical Oncology) Therese Sarah, RN as Oncology Nurse Navigator (Medical Oncology)   ASSESSMENT & PLAN:   Assessment:  1.  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  IHC positive for Napsin A, TTF-1 and CK7.  Negative for Prostein, PSA, CK20, CDX2 and calretinin. - CT chest (06/06/2023): Bilateral pleural effusions, left more than right.  Persistent fibrotic change and left lower lobe consolidation.  Extensive sclerotic osseous lesions.  Hilar and mediastinal nodes measuring up to 12 mm (subcarinal). - CTAP (06/12/2023): Diffuse sclerotic bony metastasis with extensive involvement of bilateral hips.  Indeterminate left adrenal lesion.  Indeterminate subcentimeter hypodensity within the right lobe of the liver, too small to characterize.  2.  Social/family history: - Lives at home with his wife.  On O2 nasal cannula since discharge from the hospital on 06/12/2023.  Quit smoking 20 years ago.  Smoked half pack per day for 20 years.  No asbestos or chemical exposure. - No family history of malignancy.   Plan:  1.  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 - We have also reviewed pathology report in detail. - Recommend whole-body PET/CT scan and MRI of the brain. - Will ask pathology to see if there are adequate cells from cytology for NGS testing. - Will send [employer] testing today.  2.  Extensive bone metastasis: - PSA was normal at 0.74.  He reports mild left-sided rib pain since thoracentesis. - Due to the findings on the CT scan of bilateral hips, I have recommended x-rays to see if any orthopedic intervention needed. - I have recommended limited weightbearing. - He will need a rollator walker for ambulation.  3.  Sleeplessness: - He is unable to sleep.  Will start him on trazodone 50 mg at bedtime and titrate up as needed.  4.  Mild hypercalcemia: - Calcium today is 9.4 with albumin 2.6, corrected calcium of 10.5.  Encouraged aggressive hydration.  Will closely monitor.   Orders Placed This Encounter  Procedures   NM PET Image Initial (PI) Skull Base To Thigh    Standing Status:   Future    Expected Date:   06/25/2023    Expiration Date:   06/17/2024    If indicated for the ordered procedure, I authorize the administration of a radiopharmaceutical per Radiology protocol:   Yes    Preferred imaging location?:   [facility]    Release to patient:   Immediate   MR Brain W Wo Contrast    Standing Status:   Future    Expected Date:   06/25/2023    Expiration Date:   06/17/2024    If indicated for the ordered procedure, I authorize the administration of contrast media per Radiology protocol:   Yes  What is the patient's sedation requirement?:   No Sedation    Does the patient have a pacemaker or implanted devices?:   No    Use SRS Protocol?:   No    Preferred imaging location?:   [facility] (table limit - 550lbs)    Release to patient:   Immediate   DG HIP UNILAT WITH PELVIS 2-3 VIEWS LEFT    Standing Status:   Future    Number of Occurrences:   1     Expected Date:   06/18/2023    Expiration Date:   06/17/2024    Reason for Exam (SYMPTOM  OR DIAGNOSIS REQUIRED):   metastatic cancer to hip/pelvis    Preferred imaging location?:   [facility]    Release to patient:   Immediate   DG HIP UNILAT WITH PELVIS 2-3 VIEWS RIGHT    Standing Status:   Future    Number of Occurrences:   1    Expected Date:   06/18/2023    Expiration Date:   06/17/2024    Reason for Exam (SYMPTOM  OR DIAGNOSIS REQUIRED):   metastatic cancer to hip/pelvis    Preferred imaging location?:   [facility]    Release to patient:   Immediate   DG Pelvis Comp Min 3V    Standing Status:   Future    Number of Occurrences:   1    Expected Date:   06/18/2023    Expiration Date:   06/17/2024    Reason for Exam (SYMPTOM  OR DIAGNOSIS REQUIRED):   metastatic cancer to hip/pelvis    Preferred imaging location?:   [facility]    Release to patient:   Immediate   CBC with Differential    Standing Status:   Future    Number of Occurrences:   1    Expected Date:   06/18/2023    Expiration Date:   06/17/2024   Comprehensive metabolic panel    Standing Status:   Future    Number of Occurrences:   1    Expected Date:   06/18/2023    Expiration Date:   06/17/2024   Lactate dehydrogenase    Standing Status:   Future    Number of Occurrences:   1    Expected Date:   06/18/2023    Expiration Date:   06/17/2024      Hurman Maiden R Teague,acting as a scribe for Paulett Boros, MD.,have documented all relevant documentation on the behalf of Paulett Boros, MD,as directed by  Paulett Boros, MD while in the presence of Paulett Boros, MD.   I, Paulett Boros MD, have reviewed the above documentation for accuracy and completeness, and I agree with the above.   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    Prior Therapy: None  Current Therapy: Under workup   HISTORY OF PRESENT ILLNESS:   Oncology History   No history exists.      Chrstopher is a 68 y.o. 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   Patient was admitted to  the hospital from 06/07/23 to 06/13/23 for pneumonia after previously being diagnosed with lower lobe pneumonia on 04/19/23 s/p Levaquin course. Kedric  was treated with cefepime, azithromycin, and solu-medrol transitioned to prednisone during hospitalization. Imaging showed bilateral pleural effusions, left greater than right. He underwent left thoracentesis on 06/07/23, draining 250 mL of amber-colored fluid with left lung collapse. Cytology of pleural fluid showed: adenocarcinoma with malignant cells positive for Napsin A, TTF-1 and cytokeratin 7.   CT chest high resolution from 05/26/23 found: Pulmonary parenchymal pattern of fibrosis, as detailed above, grossly similar to prior exams. Persistent superimposed patchy ground-glass and consolidation, progressive in the left lower lobe, worrisome for occult malignancy. Highly loculated moderate left pleural effusion in simple right pleural effusion. Diffuse osseous metastatic disease, suggestive of prostate cancer. Aortic atherosclerosis. Coronary artery calcification. Enlarged pulmonic trunk, indicative of pulmonary arterial hypertension.  CT A/P from 06/12/23 showed: Diffuse sclerotic bony metastases as above. Extensive involvement of the bilateral hips may place the patient at risk for fracture. In a male patient of this age, prostate cancer is the diagnosis of exclusion. Indeterminate left adrenal  lesion, new since 10/21/2021 chest CT. Metastatic disease cannot be excluded. Indeterminate subcentimeter hypodensity within the right lobe liver, too small to characterize. Aortic Atherosclerosis.   Today, he states that he is doing well overall. His appetite level is at 100%. His energy level is at 50%.  PAST MEDICAL HISTORY:   Past Medical History: Past Medical History:  Diagnosis Date   Abnormal CT scan, stomach    Chronic systolic dysfunction of left ventricle    Coronary artery disease    lateral STEMI 02/22/15 3/10 cutting balloon to ISR of the o/pLCX   GERD (gastroesophageal reflux disease)    Hyperlipidemia    Hypertension    Ischemic cardiomyopathy    LBBB (left bundle branch block)    Leukocytosis    followed by hematology, reactive   Lymphadenopathy    Myocardial infarction (HCC) 02/2015   Psoriasis 2003   Psoriatic arthritis (HCC)    Pulmonary fibrosis (HCC)    Rheumatoid arthritis(714.0) 2012   Typical atrial flutter (HCC)     Surgical History: Past Surgical History:  Procedure Laterality Date   CARDIAC CATHETERIZATION N/A 02/22/2015   Procedure: Left Heart Cath and Coronary Angiography;  Surgeon: Avanell Leigh, MD;  Location: [facility] INVASIVE CV LAB;  Service: Cardiovascular;  Laterality: N/A;   CARDIAC CATHETERIZATION N/A 02/22/2015   Procedure: Coronary Stent Intervention;  Surgeon: Avanell Leigh, MD;  Location: [facility] INVASIVE CV LAB;  Service: Cardiovascular;  Laterality: N/A;   CARDIOVERSION N/A 04/12/2015   Procedure: CARDIOVERSION;  Surgeon: Darlis Eisenmenger, MD;  Location: [facility] ENDOSCOPY;  Service: Cardiovascular;  Laterality: N/A;   COLONOSCOPY  07/01/2003   ZOX:WRUEAV colonic mucosa except for the proximal right colon in the area of ICV which was not seen completely due to inadequate bowel prep. followed with ACBE which was normal.    COLONOSCOPY N/A 08/24/2015   Dr. Riley Cheadle: Normal colon. Next colonoscopy in 10 years.   CORONARY BALLOON ANGIOPLASTY N/A  05/12/2018   Procedure: CORONARY BALLOON ANGIOPLASTY;  Surgeon: Arty Binning, MD;  Location[facility] INVASIVE CV LAB;  Service: Cardiovascular;  Laterality: N/A;   ELECTROPHYSIOLOGIC STUDY N/A 05/30/2015   Atrial fibrillation ablation by Dr Nunzio Belch   ESOPHAGOGASTRODUODENOSCOPY N/A 08/24/2015   Dr. Riley Cheadle: Medium-sized hiatal hernia, erosive gastropathy. Cameron lesions. Esophageal mucosa distally suggestive of short segment Barrett's esophagus. Not confirmed on biopsy. Gastric biopsy with minimal chronic inflammation  GIVENS CAPSULE STUDY N/A 04/17/2016   Procedure: GIVENS CAPSULE STUDY;  Surgeon: Suzette Espy, MD;  Location: [facility] ENDO SUITE;  Service: Endoscopy;  Laterality: N/A;  Pt to arrive at 8:00 am for 8:30 am appt   LEFT HEART CATH AND CORONARY ANGIOGRAPHY N/A 05/11/2018   Procedure: LEFT HEART CATH AND CORONARY ANGIOGRAPHY;  Surgeon: Arty Binning, MD;  Location: [facility] INVASIVE CV LAB;  Service: Cardiovascular;  Laterality: N/A;   TEE WITHOUT CARDIOVERSION N/A 04/12/2015   Procedure: TRANSESOPHAGEAL ECHOCARDIOGRAM (TEE);  Surgeon: Darlis Eisenmenger, MD;  Location: [facility] ENDOSCOPY;  Service: Cardiovascular;  Laterality: N/A;    Social History: Social History   Socioeconomic History   Marital status: Married    Spouse name: Not on file   Number of children: 2   Years of education: Not on file   Highest education level: Not on file  Occupational History   Occupation: unemployed    Comment: not working do to arthritis; used to be Production designer, theatre/television/film for a [employer], systems  Tobacco Use   Smoking status: Former    Current packs/day: 0.00    Average packs/day: 1 pack/day for 30.0 years (30.0 ttl pk-yrs)    Types: Cigarettes    Start date: 03/20/1973    Quit date: 03/21/2003    Years since quitting: 20.2    Passive exposure: Past   Smokeless tobacco: Never  Vaping Use   Vaping status: Never Used  Substance and Sexual Activity   Alcohol use: No    Alcohol/week: 0.0 standard drinks of alcohol     Comment: H/O case of beer weekly x 20 years, quiting in 2000-ish.   Drug use: No    Comment: H/O marijuana use many years ago.   Sexual activity: Yes    Birth control/protection: None  Other Topics Concern   Not on file  Social History Narrative   Not on file   Social Drivers of Health   Financial Resource Strain: Low Risk  (04/29/2023)   Received from [facility]   Overall Financial Resource Strain (CARDIA)    Difficulty of Paying Living Expenses: Not very hard  Food Insecurity: No Food Insecurity (06/07/2023)   Hunger Vital Sign    Worried About Running Out of Food in the Last Year: Never true    Ran Out of Food in the Last Year: Never true  Transportation Needs: No Transportation Needs (06/07/2023)   PRAPARE - Administrator, Civil Service (Medical): No    Lack of Transportation (Non-Medical): No  Physical Activity: Unknown (04/29/2023)   Received from [facility]   Exercise Vital Sign    Days of Exercise per Week: 0 days    Minutes of Exercise per Session: Not on file  Stress: No Stress Concern Present (04/29/2023)   Received from [facility] - Occupational Stress Questionnaire    Feeling of Stress : Not at all  Social Connections: Patient Declined (06/07/2023)   Social Connection and Isolation Panel [NHANES]    Frequency of Communication with Friends and Family: Patient declined    Frequency of Social Gatherings with Friends and Family: Patient declined    Attends Religious Services: Patient declined    Active Member of Clubs or Organizations: Patient declined    Attends Banker Meetings: Patient declined    Marital Status: Patient declined  Intimate Partner Violence: Not At Risk (06/07/2023)   Humiliation, Afraid, Rape, and Kick questionnaire    Fear  of Current or Ex-Partner: No    Emotionally Abused: No    Physically Abused: No    Sexually Abused: No    Family History: Family History  Problem  Relation Age of Onset   Hypertension Mother    Colon cancer Neg Hx     Current Medications:  Current Outpatient Medications:    Abatacept (ORENCIA CLICKJECT) 125 MG/ML SOAJ, Inject 125 mg into the skin once a week., Disp: 12 mL, Rfl: 0   albuterol (VENTOLIN HFA) 108 (90 Base) MCG/ACT inhaler, Inhale 2 puffs into the lungs every 6 (six) hours as needed for wheezing or shortness of breath., Disp: 17 each, Rfl: 6   amLODipine (NORVASC) 5 MG tablet, Take 1 tablet (5 mg total) by mouth daily., Disp: 90 tablet, Rfl: 3   apixaban (ELIQUIS) 5 MG TABS tablet, TAKE 1 TABLET TWICE DAILY, Disp: 180 tablet, Rfl: 1   atorvastatin (LIPITOR) 80 MG tablet, TAKE 1 TABLET EVERY DAY AT 6 PM (APPOINTMENT IS NEEDED), Disp: 90 tablet, Rfl: 3   azaTHIOprine (IMURAN) 50 MG tablet, TAKE 3 TABLETS EVERY DAY, Disp: 270 tablet, Rfl: 0   clobetasol (TEMOVATE) 0.05 % external solution, Apply 1 Application topically 2 (two) times daily. (Patient taking differently: Apply 1 Application topically 2 (two) times daily as needed (Dermatitis).), Disp: 50 mL, Rfl: 0   Clobetasol Prop Emollient Base (CLOBETASOL PROPIONATE E) 0.05 % emollient cream, APPLY TO AFFECTED AREA TWICE A DAY AS NEEDED, Disp: 30 g, Rfl: 2   clopidogrel (PLAVIX) 75 MG tablet, Take 1 tablet (75 mg total) by mouth daily., Disp: 90 tablet, Rfl: 3   dextromethorphan-guaiFENesin (MUCINEX DM) 30-600 MG 12hr tablet, Take 1 tablet by mouth 2 (two) times daily for 7 days., Disp: 14 tablet, Rfl: 0   docusate sodium (COLACE) 100 MG capsule, Take 1 capsule (100 mg total) by mouth daily., Disp: 30 capsule, Rfl: 0   feeding supplement (ENSURE ENLIVE / ENSURE PLUS) LIQD, Take 237 mLs by mouth 2 (two) times daily between meals., Disp: , Rfl:    furosemide (LASIX) 40 MG tablet, TAKE 1 TABLET TWICE DAILY, Disp: 180 tablet, Rfl: 3   losartan (COZAAR) 100 MG tablet, TAKE 1 TABLET EVERY DAY, Disp: 90 tablet, Rfl: 3   pantoprazole (PROTONIX) 40 MG tablet, TAKE 1 TABLET TWICE DAILY,  Disp: 180 tablet, Rfl: 1   potassium chloride SA (KLOR-CON M) 20 MEQ tablet, TAKE 2 TABLETS TWICE DAILY, Disp: 360 tablet, Rfl: 3   predniSONE (DELTASONE) 20 MG tablet, Take 2 tablets (40 mg total) by mouth daily with breakfast for 2 days, THEN 1.5 tablets (30 mg total) daily with breakfast for 2 days, THEN 1 tablet (20 mg total) daily with breakfast for 2 days, THEN 0.5 tablets (10 mg total) daily with breakfast for 2 days., Disp: 10 tablet, Rfl: 0   [START ON 06/22/2023] predniSONE (DELTASONE) 5 MG tablet, Take 1 tablet (5 mg total) by mouth daily with breakfast., Disp: , Rfl:    sotalol (BETAPACE) 120 MG tablet, TAKE 1 TABLET EVERY 12 HOURS, Disp: 180 tablet, Rfl: 3   traZODone (DESYREL) 50 MG tablet, Take 1 tablet (50 mg total) by mouth at bedtime as needed for sleep., Disp: 30 tablet, Rfl: 0   Allergies: No Known Allergies  REVIEW OF SYSTEMS:   Review of Systems  Constitutional:  Negative for chills, fatigue and fever.  HENT:   Negative for lump/mass, mouth sores, nosebleeds, sore throat and trouble swallowing.   Eyes:  Negative for eye problems.  Respiratory:  Positive for cough and shortness of breath.   Cardiovascular:  Negative for chest pain, leg swelling and palpitations.  Gastrointestinal:  Negative for abdominal pain, constipation, diarrhea, nausea and vomiting.  Genitourinary:  Negative for bladder incontinence, difficulty urinating, dysuria, frequency, hematuria and nocturia.   Musculoskeletal:  Negative for arthralgias, back pain, flank pain, myalgias and neck pain.  Skin:  Negative for itching and rash.  Neurological:  Positive for dizziness. Negative for headaches and numbness.  Hematological:  Does not bruise/bleed easily.  Psychiatric/Behavioral:  Positive for sleep disturbance. Negative for depression and suicidal ideas. The patient is not nervous/anxious.   All other systems reviewed and are negative.    VITALS:   Blood pressure (!) 109/52, pulse (!) 56, temperature  (!) 97.5 F (36.4 C), temperature source Oral, resp. rate 18, height 5\' 6"  (1.676 m), weight 159 lb 2.8 oz (72.2 kg), SpO2 99%.  Wt Readings from Last 3 Encounters:  06/18/23 159 lb 2.8 oz (72.2 kg)  06/09/23 158 lb 8.2 oz (71.9 kg)  06/04/23 158 lb 9.6 oz (71.9 kg)    Body mass index is 25.69 kg/m.  Performance status (ECOG): 1 - Symptomatic but completely ambulatory  PHYSICAL EXAM:   Physical Exam Vitals and nursing note reviewed. Exam conducted with a chaperone present.  Constitutional:      Appearance: Normal appearance.  Cardiovascular:     Rate and Rhythm: Normal rate and regular rhythm.     Pulses: Normal pulses.     Heart sounds: Normal heart sounds.  Pulmonary:     Effort: Pulmonary effort is normal.     Breath sounds: Normal breath sounds.     Comments: Decreased breath sounds at the left lung base Abdominal:     Palpations: Abdomen is soft. There is no hepatomegaly, splenomegaly or mass.     Tenderness: There is no abdominal tenderness.  Musculoskeletal:     Right lower leg: Edema present.     Left lower leg: Edema present.  Lymphadenopathy:     Cervical: No cervical adenopathy.     Right cervical: No superficial, deep or posterior cervical adenopathy.    Left cervical: No superficial, deep or posterior cervical adenopathy.     Upper Body:     Right upper body: No supraclavicular or axillary adenopathy.     Left upper body: No supraclavicular or axillary adenopathy.  Neurological:     General: No focal deficit present.     Mental Status: He is alert and oriented to person, place, and time.  Psychiatric:        Mood and Affect: Mood normal.        Behavior: Behavior normal.     LABS:   CBC    Component Value Date/Time   WBC 11.7 (H) 06/18/2023 1353   RBC 4.29 06/18/2023 1353   HGB 11.8 (L) 06/18/2023 1353   HGB 14.5 08/12/2011 1256   HCT 38.5 (L) 06/18/2023 1353   HCT 44.7 08/12/2011 1256   PLT 276 06/18/2023 1353   PLT 240 08/12/2011 1256   MCV  89.7 06/18/2023 1353   MCV 82.2 08/12/2011 1256   MCH 27.5 06/18/2023 1353   MCHC 30.6 06/18/2023 1353   RDW 18.4 (H) 06/18/2023 1353   RDW 14.7 (H) 08/12/2011 1256   LYMPHSABS 1.0 06/18/2023 1353   LYMPHSABS 5.5 (H) 08/12/2011 1256   MONOABS 0.3 06/18/2023 1353   MONOABS 1.6 (H) 08/12/2011 1256   EOSABS 0.0 06/18/2023 1353   EOSABS 0.2 08/12/2011 1256  BASOSABS 0.0 06/18/2023 1353   BASOSABS 0.1 08/12/2011 1256    CMP    Component Value Date/Time   NA 137 06/18/2023 1353   K 5.5 (H) 06/18/2023 1353   CL 90 (L) 06/18/2023 1353   CO2 33 (H) 06/18/2023 1353   GLUCOSE 101 (H) 06/18/2023 1353   BUN 16 06/18/2023 1353   CREATININE 0.38 (L) 06/18/2023 1353   CREATININE 0.68 (L) 05/25/2021 1021   CALCIUM 9.4 06/18/2023 1353   PROT 6.3 (L) 06/18/2023 1353   ALBUMIN 2.6 (L) 06/18/2023 1353   AST 43 (H) 06/18/2023 1353   ALT 46 (H) 06/18/2023 1353   ALKPHOS 132 (H) 06/18/2023 1353   BILITOT 0.4 06/18/2023 1353   GFRNONAA >60 06/18/2023 1353   GFRNONAA 98 03/14/2020 1304   GFRAA 114 03/14/2020 1304     No results found for: "CEA1", "CEA" / No results found for: "CEA1", "CEA" No results found for: "PSA1" No results found for: "UJW119" No results found for: "JYN829"  Lab Results  Component Value Date   TOTALPROTELP 7.1 09/19/2016   ALBUMINELP 3.5 (L) 09/19/2016   A1GS 0.4 (H) 09/19/2016   A2GS 0.9 09/19/2016   BETS 0.5 09/19/2016   BETA2SER 0.5 09/19/2016   GAMS 1.3 09/19/2016   SPEI   09/19/2016   Lab Results  Component Value Date   TIBC 213 (L) 07/17/2016   TIBC 202 (L) 01/19/2016   FERRITIN 188 01/28/2018   FERRITIN 444 (H) 07/17/2016   FERRITIN 245 01/19/2016   IRONPCTSAT 15 07/17/2016   IRONPCTSAT 9 (L) 01/19/2016   Lab Results  Component Value Date   LDH 256 (H) 06/18/2023   LDH 205 (H) 03/25/2023   LDH 151 03/19/2022     STUDIES:   DG HIP UNILAT WITH PELVIS 2-3 VIEWS LEFT Result Date: 06/18/2023 CLINICAL DATA:  metastatic cancer to hip/pelvis  EXAM: DG HIP (WITH OR WITHOUT PELVIS) 2-3V LEFT; DG HIP (WITH OR WITHOUT PELVIS) 2-3V RIGHT COMPARISON:  CT scan abdomen and pelvis from 06/12/2023. FINDINGS: Pelvis is intact with normal and symmetric sacroiliac joints. No acute fracture or dislocation. Redemonstration of extensive sclerotic lesions throughout the bones, compatible with patient's known history of metastasis. Visualized sacral arcuate lines are unremarkable. Unremarkable symphysis pubis. There are mild degenerative changes of bilateral hip joints without significant joint space narrowing. Osteophytosis of the superior acetabulum. No radiopaque foreign bodies. IMPRESSION: No acute fracture or dislocation. Redemonstration of extensive sclerotic lesions throughout the bones, compatible with patient's known history of metastasis. Electronically Signed   By: Beula Brunswick M.D.   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 FINDINGS: Pelvis is intact with normal and symmetric sacroiliac joints. No acute fracture or dislocation. Redemonstration of extensive sclerotic lesions throughout the bones, compatible with patient's known history of metastasis. Visualized sacral arcuate lines are unremarkable. Unremarkable symphysis pubis. There are mild degenerative changes of bilateral hip joints without significant joint space narrowing. Osteophytosis of the superior acetabulum. No radiopaque foreign bodies. IMPRESSION: No acute fracture or dislocation. Redemonstration of extensive sclerotic lesions throughout the bones, compatible with patient's known history of metastasis. Electronically Signed   By: Beula Brunswick M.D.   On: 06/18/2023 14:44   CT ABDOMEN PELVIS W CONTRAST Result Date: 06/12/2023 CLINICAL DATA:   History  of pneumonia and pleural effusion, suspected bony metastasis on prior chest CT, assess for metastatic disease EXAM: CT ABDOMEN AND PELVIS WITH CONTRAST TECHNIQUE: Multidetector CT imaging of the abdomen and pelvis was performed using the standard protocol following bolus administration of intravenous contrast. RADIATION DOSE REDUCTION: This exam was performed according to the departmental dose-optimization program which includes automated exposure control, adjustment of the mA and/or kV according to patient size and/or use of iterative reconstruction technique. CONTRAST:  75mL OMNIPAQUE IOHEXOL 350 MG/ML SOLN COMPARISON:  06/06/2023 FINDINGS: Lower chest: Stable bibasilar fibrosis and left lower lobe consolidation. Stable small bilateral pleural effusions, partially loculated on the left. Hepatobiliary: Indeterminate 0.8 cm hypodensity within the right lobe liver image 16/3. Otherwise no focal liver abnormality. No gallstones, gallbladder wall thickening, or biliary dilatation. Pancreas: Unremarkable. No pancreatic ductal dilatation or surrounding inflammatory changes. Spleen: Normal in size without focal abnormality. Adrenals/Urinary Tract: There is a 1.5 cm indeterminate left adrenal lesion, measuring 49 HU, new since the 09/20/2021 chest CT. Right adrenal is unremarkable. Minimal areas of bilateral renal cortical scarring are noted. Otherwise the kidneys enhance normally and symmetrically. No urinary tract calculi or obstructive uropathy. Bladder is unremarkable. Stomach/Bowel: No bowel obstruction or ileus. Normal appendix right lower quadrant. No bowel wall thickening or inflammatory change. Small hiatal hernia. Vascular/Lymphatic: Diffuse atherosclerosis of the aorta and its branches. High-grade stenoses of the bilateral common and external iliac arteries, right greater than left. No pathologic adenopathy within the abdomen or pelvis. Reproductive: The prostate is not enlarged. Other: No free fluid or  free intraperitoneal gas. No abdominal wall hernia. Musculoskeletal: Diffuse sclerotic lesions are seen throughout the visualized thoracolumbar spine, thoracic cage, bony pelvis, and bilateral hips, compatible with diffuse bony metastases. The extensive involvement of the intertrochanteric regions of the bilateral hips may place the patient at risk for fracture. No acute or pathologic fractures are evident on this exam. Reconstructed images demonstrate no additional findings. IMPRESSION: 1. Diffuse sclerotic bony metastases as above. Extensive involvement of the bilateral hips may place the patient at risk for fracture. In a male patient of this age, prostate cancer is the diagnosis of exclusion. 2. Indeterminate left adrenal lesion, new since 10/21/2021 chest CT. Metastatic disease cannot be excluded. 3. Indeterminate subcentimeter hypodensity within the right lobe liver, too small to characterize. 4. Aortic Atherosclerosis (ICD10-I70.0). High-grade stenoses are seen within the bilateral common and external iliac distributions. 5. Stable findings at the lung bases, with bilateral effusions, bibasilar fibrosis, left basilar consolidation again noted. Please see recent CT chest report. Electronically Signed   By: Sharlet Salina M.D.   On: 06/12/2023 16:15   CT Chest High Resolution Result Date: 06/11/2023 CLINICAL DATA:  Diffuse/interstitial lung disease. EXAM: CT CHEST WITHOUT CONTRAST TECHNIQUE: Multidetector CT imaging of the chest was performed following the standard protocol without intravenous contrast. High resolution imaging of the lungs, as well as inspiratory and expiratory imaging, was performed. RADIATION DOSE REDUCTION: This exam was performed according to the departmental dose-optimization program which includes automated exposure control, adjustment of the mA and/or kV according to patient size and/or use of iterative reconstruction technique. COMPARISON:  04/19/2023, 09/20/2021 and 09/12/2020.  FINDINGS: Cardiovascular: Atherosclerotic calcification of the aorta, aortic valve and coronary arteries. Enlarged pulmonic trunk and heart. No pericardial effusion. Mediastinum/Nodes: Mediastinal lymph nodes measure up to 10 mm in the low right paratracheal station, similar. Hilar regions are difficult to definitively evaluate without IV contrast. No axillary adenopathy. Esophagus is grossly unremarkable. Lungs/Pleura: Image quality is degraded by respiratory  motion. Patchy ground-glass and consolidation in the lungs bilaterally, with progressive consolidation in the anterior left lower lobe (7/82). Underlying patchy traction bronchiectasis, ground-glass and architectural distortion, as on 09/20/2021 with relative sparing of the subpleural regions. Highly loculated moderate left pleural effusion, as on 04/19/2023. Small right pleural effusion. Debris in the airway. Upper Abdomen: Visualized portions of the liver, gallbladder, adrenal glands, kidneys, spleen, pancreas, stomach and bowel are grossly unremarkable. No upper abdominal adenopathy. Musculoskeletal: Sclerotic lesions throughout the visualized osseous structures. IMPRESSION: 1. Pulmonary parenchymal pattern of fibrosis, as detailed above, grossly similar to prior exams. 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 Findings are suggestive of an alternative diagnosis (not UIP) per consensus guidelines: Diagnosis of Idiopathic Pulmonary Fibrosis: An Official ATS/ERS/JRS/ALAT Clinical Practice Guideline. Am Rosezetta Schlatter Crit Care Med Vol 198, Iss 5, [phone], Nov 02 2016. 2. Persistent superimposed patchy ground-glass and consolidation, progressive in the left lower lobe, worrisome for occult malignancy. 3. Highly loculated moderate left pleural effusion in simple right pleural effusion. 4. Diffuse osseous metastatic disease, suggestive of prostate cancer. 5. Aortic atherosclerosis (ICD10-I70.0). Coronary artery calcification. 6. Enlarged pulmonic  trunk, indicative of pulmonary arterial hypertension. Electronically Signed   By: Leanna Battles M.D.   On: 06/11/2023 09:50   DG CHEST PORT 1 VIEW Result Date: 06/08/2023 CLINICAL DATA:  Lung collapse. EXAM: PORTABLE CHEST 1 VIEW COMPARISON:  June 07, 2023. FINDINGS: Stable cardiomediastinal silhouette. There is increased aeration of the left upper lobe compared to prior exam. There remains left lung opacity concerning for pneumonia or edema. Probable mild to moderate size left pleural effusion is noted. Right basilar atelectasis, edema or atelectasis is noted with small right pleural effusion. Bony thorax is unremarkable. IMPRESSION: Bilateral lung opacities as noted above. There does appear to be significantly increased aeration of left upper lobe compared to prior exam. Electronically Signed   By: Lupita Raider M.D.   On: 06/08/2023 08:11   DG Chest 1 View Addendum Date: 06/07/2023 ADDENDUM REPORT: 06/07/2023 12:24 ADDENDUM: Study discussed by telephone with Dr. Tyson Babinski on 06/07/2023 at 1214 hours. Electronically Signed   By: Odessa Fleming M.D.   On: 06/07/2023 12:24   Result Date: 06/07/2023 CLINICAL DATA:  68 year old male status post ultrasound-guided left side thoracentesis this morning. EXAM: CHEST  1 VIEW COMPARISON:  Chest CT yesterday. FINDINGS: AP view of the chest 1020 hours. White out of the left lung now, but slightly leftward shift of the mediastinum when compared to the CT scout view yesterday. No pneumothorax is identified. Right lung ventilation with patchy and coarse opacity is stable. Visualized tracheal air column is within normal limits. Negative visible bowel gas. No acute osseous abnormality identified. IMPRESSION: 1. Abnormal white out of the left lung since yesterday, but with some leftward mediastinal shift since that time suggesting this is mucous plugging and lung collapse. 2. No pneumothorax is identified. Electronically Signed: By: Odessa Fleming M.D. On: 06/07/2023 11:57   US Abdomen  Limited RUQ (LIVER/GB) Result Date: 06/07/2023 CLINICAL DATA:  Elevated liver function tests EXAM: ULTRASOUND ABDOMEN LIMITED RIGHT UPPER QUADRANT COMPARISON:  None Available. FINDINGS: Gallbladder: No gallstones or wall thickening visualized. No sonographic Murphy sign noted by sonographer. Common bile duct: Diameter: 5 mm Liver: No focal lesion identified. Within normal limits in parenchymal echogenicity. Portal vein is patent on color Doppler imaging with normal direction of blood flow towards the liver. Other: Small right pleural effusion. IMPRESSION: No gallstones or ductal dilatation. Small right pleural effusion. Please correlate with chest  CT of 06/06/2023 Electronically Signed   By: Karen Kays M.D.   On: 06/07/2023 12:01   US THORACENTESIS ASP PLEURAL SPACE W/IMG GUIDE Result Date: 06/07/2023 INDICATION: 68 year old male. Recent history of pneumonia. Admitted for shortness of breath team is requesting a therapeutic and diagnostic left-sided thoracentesis EXAM: ULTRASOUND GUIDED THERAPEUTIC AND DIAGNOSTIC LEFT-SIDED THORACENTESIS MEDICATIONS: Lidocaine 1% 10 mL COMPLICATIONS: None immediate. PROCEDURE: An ultrasound guided thoracentesis was thoroughly discussed with the patient and questions answered. The benefits, risks, alternatives and complications were also discussed. The patient understands and wishes to proceed with the procedure. Written consent was obtained. Ultrasound was performed to localize and mark an adequate pocket of fluid in the left chest. The area was then prepped and draped in the normal sterile fashion. 1% Lidocaine was used for local anesthesia. Under ultrasound guidance a 6 Fr Safe-T-Centesis catheter was introduced. Thoracentesis was performed. The catheter was removed and a dressing applied. FINDINGS: A total of approximately 250 mL of amber color fluid was removed. Samples were sent to the laboratory as requested by the clinical team. IMPRESSION: Successful ultrasound guided  therapeutic and diagnostic left-sided thoracentesis yielding 250 mL amber: pleural fluid. Performed by Anders Grant NP Electronically Signed   By: Gilmer Mor D.O.   On: 06/07/2023 11:19   CT Chest W Contrast Result Date: 06/06/2023 CLINICAL DATA:  Follow-up left-sided pleural effusion EXAM: CT CHEST WITH CONTRAST TECHNIQUE: Multidetector CT imaging of the chest was performed during intravenous contrast administration. RADIATION DOSE REDUCTION: This exam was performed according to the departmental dose-optimization program which includes automated exposure control, adjustment of the mA and/or kV according to patient size and/or use of iterative reconstruction technique. CONTRAST:  75mL OMNIPAQUE IOHEXOL 300 MG/ML  SOLN COMPARISON:  05/26/2023, 04/19/2023 FINDINGS: Cardiovascular: Atherosclerotic calcifications of the thoracic aorta are noted without aneurysmal dilatation or dissection. Heavy coronary calcifications are seen. The heart is at the upper limits of normal in size. No sizable central pulmonary emboli are noted. Mediastinum/Nodes: Thoracic inlet is within normal limits. Scattered likely reactive lymph nodes are noted in the mediastinum similar to that seen on the prior exam. The largest of these is in the subcarinal region measuring 12 mm slightly decreased when compared with the prior exam. The hilar nodes are also slightly decreased in size when compared with the prior study. The esophagus is air-filled and within normal limits. A small sliding-type hiatal hernia is noted. Lungs/Pleura: Bilateral pleural effusions are again identified left worse than right with fluid trapped in the major fissure on the left. The right-sided effusion is stable when compared with 05/26/2023 but increased when compared with the prior exam from February of 2025. Persistent diffuse fibrotic changes are identified in both lungs with some area of adjacent consolidation particularly in the left lower lobe also stable  from the prior study. No new parenchymal nodules or areas of consolidation are noted. Upper Abdomen: Visualized upper abdomen shows no acute abnormality. Some scarring in the right kidney is noted. Musculoskeletal: Multiple sclerotic lesions are again identified throughout the bony structures to include the ribcage, thoracic spine and proximal humeri bilaterally. Scapular and sternal lesions are noted as well. These are again highly suggestive of metastatic prostate disease. Correlation with PSA is recommended. IMPRESSION: Overall stable appearance of the pleural effusions bilaterally left greater than right. Persistent fibrotic change and left lower lobe consolidation is seen and stable. No new areas of consolidation are noted. Hiatal hernia. Extensive sclerotic osseous lesions highly suggestive of metastatic prostate carcinoma. Correlate with serum  PSA. These are relatively stable when compared with the prior exam. Hilar and mediastinal lymph nodes likely reactive in nature and slightly decreased when compared with the prior exam. Aortic Atherosclerosis (ICD10-I70.0). Electronically Signed   By: Violeta Grey M.D.   On: 06/06/2023 19:32   DG Chest 2 View Result Date: 06/06/2023 CLINICAL DATA:  Shortness of breath. EXAM: CHEST - 2 VIEW COMPARISON:  04/19/2023. FINDINGS: Re-demonstration of left retrocardiac airspace opacity obscuring the left hemidiaphragm, descending thoracic aorta and blunting the left lateral costophrenic angle, suggesting combination of left lung atelectasis and/or consolidation with pleural effusion. No significant interval change. There also diffuse reticulonodular opacities throughout bilateral lungs, similar to prior study and corresponds to underlying chronic interstitial lung disease. No acute consolidation or lung collapse. There is trace right pleural effusion as well, unchanged. Stable cardio-mediastinal silhouette. No acute osseous abnormalities. The soft tissues are within normal  limits. IMPRESSION: *No significant interval change since the prior study. Redemonstration of left retrocardiac airspace opacity, likely combination of left lung atelectasis and/or consolidation with pleural effusion. Electronically Signed   By: Beula Brunswick M.D.   On: 06/06/2023 15:22

## 2023-06-18 NOTE — Patient Instructions (Addendum)
 [facility]  Discharge Instructions  You were seen and examined today by Dr. Cheree Cords. Dr. Katragadda is a medical oncologist, meaning that he specializes in the treatment of cancer diagnoses. Dr. Cheree Cords discussed your past medical history, family history of cancers, and the events that led to you being here today.  You were referred to Dr. Cheree Cords due to a new diagnosis of lung cancer. There was nothing obvious on your CT scan for a mass in the lung, so we will send additional testing on the removed fluid to confirm cancer type and any genetic mutations.  Dr. Cheree Cords has requested a PET scan and a brain MRI to complete the staging work-up. The PET scan is a specialized CT scan that illuminates where there is cancer present in the body.  Follow-up as scheduled with Dr. Katragadda after the scans.  Thank you for choosing  [facility] to provide your oncology and hematology care.   To afford each patient quality time with our provider, please arrive at least 15 minutes before your scheduled appointment time. You may need to reschedule your appointment if you arrive late (10 or more minutes). Arriving late affects you and other patients whose appointments are after yours.  Also, if you miss three or more appointments without notifying the office, you may be dismissed from the clinic at the provider's discretion.    Again, thank you for choosing [facility].  Our hope is that these requests will decrease the amount of time that you wait before being seen by our physicians.   If you have a lab appointment with the Cancer Center - please note that after April 8th, all labs will be drawn in the cancer center.  You do not have to check in or register with the main entrance as you have in the past but will complete your check-in at the cancer center.             _____________________________________________________________  Should you have questions after your visit to [facility], please contact our office at [phone] and follow the prompts.  Our office hours are 8:00 a.m. to 4:30 p.m. Monday - Thursday and 8:00 a.m. to 2:30 p.m. Friday.  Please note that voicemails left after 4:00 p.m. may not be returned until the following business day.  We are closed weekends and all major holidays.  You do have access to a nurse 24-7, just call the main number to the clinic [phone] and do not press any options, hold on the line and a nurse will answer the phone.    For prescription refill requests, have your pharmacy contact our office and allow 72 hours.    Masks are no longer required in the cancer centers. If you would like for your care team to wear a mask while they are taking care of you, please let them know. You may have one support person who is at least 70 years old accompany you for your appointments.

## 2023-06-18 NOTE — Progress Notes (Signed)
 Rollator walker ordered via [employer] per patient need.

## 2023-06-26 ENCOUNTER — Ambulatory Visit (HOSPITAL_COMMUNITY)
Admission: RE | Admit: 2023-06-26 | Discharge: 2023-06-26 | Disposition: A | Discharge status: Home / Self Care | Source: Ambulatory Visit | Attending: Hematology | Admitting: Hematology

## 2023-06-26 DIAGNOSIS — C3490 Malignant neoplasm of unspecified part of unspecified bronchus or lung: Secondary | ICD-10-CM | POA: Insufficient documentation

## 2023-06-26 MED ORDER — FLUDEOXYGLUCOSE F - 18 (FDG) INJECTION
8.0600 | Freq: Once | INTRAVENOUS | Status: AC | PRN
  Administered 2023-06-26: 8.06 via INTRAVENOUS

## 2023-06-27 ENCOUNTER — Ambulatory Visit (HOSPITAL_COMMUNITY): Admission: RE | Admit: 2023-06-27 | Source: Ambulatory Visit

## 2023-06-30 ENCOUNTER — Other Ambulatory Visit: Payer: Self-pay | Admitting: Cardiovascular Disease

## 2023-07-01 ENCOUNTER — Telehealth: Payer: Self-pay

## 2023-07-01 ENCOUNTER — Encounter (HOSPITAL_COMMUNITY): Payer: Self-pay

## 2023-07-01 NOTE — Telephone Encounter (Signed)
 Called the pt daughter regarding her  FMLA forms on behalf of her father. Left a VM stating that they were completed and faxed with confirmation received. No questions or concerns at this time.

## 2023-07-02 ENCOUNTER — Ambulatory Visit (HOSPITAL_COMMUNITY)
Admission: RE | Admit: 2023-07-02 | Discharge: 2023-07-02 | Disposition: A | Discharge status: Home / Self Care | Source: Ambulatory Visit | Attending: Hematology | Admitting: Hematology

## 2023-07-02 DIAGNOSIS — C3490 Malignant neoplasm of unspecified part of unspecified bronchus or lung: Secondary | ICD-10-CM

## 2023-07-07 ENCOUNTER — Other Ambulatory Visit: Payer: Self-pay

## 2023-07-07 ENCOUNTER — Telehealth: Payer: Self-pay

## 2023-07-07 NOTE — Telephone Encounter (Signed)
 Notified the pt daughter of her completed FMLA form. I was able to completely fill out what was missing. I let her know that I apologized on my end for not filling out the form correctly. I emailed the pt her copy as requested. Pt verbalized understanding.

## 2023-07-08 ENCOUNTER — Emergency Department (HOSPITAL_COMMUNITY)

## 2023-07-08 ENCOUNTER — Inpatient Hospital Stay (HOSPITAL_COMMUNITY)
Admission: EM | Admit: 2023-07-08 | Discharge: 2023-07-14 | DRG: 291 | Disposition: A | Discharge status: Home Health | Attending: Family Medicine | Admitting: Family Medicine

## 2023-07-08 ENCOUNTER — Other Ambulatory Visit: Payer: Self-pay

## 2023-07-08 ENCOUNTER — Encounter (HOSPITAL_COMMUNITY): Payer: Self-pay

## 2023-07-08 ENCOUNTER — Telehealth: Payer: Self-pay | Admitting: Cardiovascular Disease

## 2023-07-08 DIAGNOSIS — K219 Gastro-esophageal reflux disease without esophagitis: Secondary | ICD-10-CM | POA: Diagnosis present

## 2023-07-08 DIAGNOSIS — E878 Other disorders of electrolyte and fluid balance, not elsewhere classified: Secondary | ICD-10-CM | POA: Diagnosis present

## 2023-07-08 DIAGNOSIS — I255 Ischemic cardiomyopathy: Secondary | ICD-10-CM | POA: Diagnosis present

## 2023-07-08 DIAGNOSIS — I110 Hypertensive heart disease with heart failure: Secondary | ICD-10-CM | POA: Diagnosis not present

## 2023-07-08 DIAGNOSIS — C3490 Malignant neoplasm of unspecified part of unspecified bronchus or lung: Secondary | ICD-10-CM | POA: Diagnosis present

## 2023-07-08 DIAGNOSIS — Z7952 Long term (current) use of systemic steroids: Secondary | ICD-10-CM

## 2023-07-08 DIAGNOSIS — E8809 Other disorders of plasma-protein metabolism, not elsewhere classified: Secondary | ICD-10-CM | POA: Diagnosis present

## 2023-07-08 DIAGNOSIS — C7951 Secondary malignant neoplasm of bone: Secondary | ICD-10-CM | POA: Diagnosis present

## 2023-07-08 DIAGNOSIS — L8992 Pressure ulcer of unspecified site, stage 2: Secondary | ICD-10-CM | POA: Diagnosis not present

## 2023-07-08 DIAGNOSIS — Z7901 Long term (current) use of anticoagulants: Secondary | ICD-10-CM | POA: Diagnosis not present

## 2023-07-08 DIAGNOSIS — M069 Rheumatoid arthritis, unspecified: Secondary | ICD-10-CM | POA: Diagnosis present

## 2023-07-08 DIAGNOSIS — I2510 Atherosclerotic heart disease of native coronary artery without angina pectoris: Secondary | ICD-10-CM | POA: Diagnosis present

## 2023-07-08 DIAGNOSIS — C801 Malignant (primary) neoplasm, unspecified: Secondary | ICD-10-CM | POA: Diagnosis present

## 2023-07-08 DIAGNOSIS — Z9861 Coronary angioplasty status: Secondary | ICD-10-CM

## 2023-07-08 DIAGNOSIS — J8410 Pulmonary fibrosis, unspecified: Secondary | ICD-10-CM | POA: Diagnosis present

## 2023-07-08 DIAGNOSIS — I484 Atypical atrial flutter: Secondary | ICD-10-CM | POA: Diagnosis present

## 2023-07-08 DIAGNOSIS — I5043 Acute on chronic combined systolic (congestive) and diastolic (congestive) heart failure: Secondary | ICD-10-CM | POA: Diagnosis present

## 2023-07-08 DIAGNOSIS — L409 Psoriasis, unspecified: Secondary | ICD-10-CM | POA: Diagnosis present

## 2023-07-08 DIAGNOSIS — Z9981 Dependence on supplemental oxygen: Secondary | ICD-10-CM

## 2023-07-08 DIAGNOSIS — Z8546 Personal history of malignant neoplasm of prostate: Secondary | ICD-10-CM

## 2023-07-08 DIAGNOSIS — I252 Old myocardial infarction: Secondary | ICD-10-CM

## 2023-07-08 DIAGNOSIS — D638 Anemia in other chronic diseases classified elsewhere: Secondary | ICD-10-CM | POA: Diagnosis not present

## 2023-07-08 DIAGNOSIS — I480 Paroxysmal atrial fibrillation: Secondary | ICD-10-CM | POA: Diagnosis present

## 2023-07-08 DIAGNOSIS — I10 Essential (primary) hypertension: Secondary | ICD-10-CM

## 2023-07-08 DIAGNOSIS — E871 Hypo-osmolality and hyponatremia: Secondary | ICD-10-CM | POA: Diagnosis present

## 2023-07-08 DIAGNOSIS — Z87891 Personal history of nicotine dependence: Secondary | ICD-10-CM

## 2023-07-08 DIAGNOSIS — J9811 Atelectasis: Secondary | ICD-10-CM | POA: Diagnosis present

## 2023-07-08 DIAGNOSIS — I5021 Acute systolic (congestive) heart failure: Secondary | ICD-10-CM | POA: Diagnosis not present

## 2023-07-08 DIAGNOSIS — I4892 Unspecified atrial flutter: Secondary | ICD-10-CM | POA: Diagnosis not present

## 2023-07-08 DIAGNOSIS — Z1152 Encounter for screening for COVID-19: Secondary | ICD-10-CM | POA: Diagnosis not present

## 2023-07-08 DIAGNOSIS — L4050 Arthropathic psoriasis, unspecified: Secondary | ICD-10-CM | POA: Diagnosis present

## 2023-07-08 DIAGNOSIS — I5023 Acute on chronic systolic (congestive) heart failure: Secondary | ICD-10-CM | POA: Diagnosis not present

## 2023-07-08 DIAGNOSIS — L8990 Pressure ulcer of unspecified site, unspecified stage: Secondary | ICD-10-CM | POA: Insufficient documentation

## 2023-07-08 DIAGNOSIS — N179 Acute kidney failure, unspecified: Secondary | ICD-10-CM | POA: Diagnosis present

## 2023-07-08 DIAGNOSIS — C799 Secondary malignant neoplasm of unspecified site: Secondary | ICD-10-CM | POA: Diagnosis not present

## 2023-07-08 DIAGNOSIS — I509 Heart failure, unspecified: Principal | ICD-10-CM

## 2023-07-08 DIAGNOSIS — D509 Iron deficiency anemia, unspecified: Secondary | ICD-10-CM | POA: Diagnosis present

## 2023-07-08 DIAGNOSIS — E785 Hyperlipidemia, unspecified: Secondary | ICD-10-CM | POA: Diagnosis present

## 2023-07-08 DIAGNOSIS — L98499 Non-pressure chronic ulcer of skin of other sites with unspecified severity: Secondary | ICD-10-CM | POA: Diagnosis present

## 2023-07-08 DIAGNOSIS — Z8701 Personal history of pneumonia (recurrent): Secondary | ICD-10-CM

## 2023-07-08 DIAGNOSIS — Z8249 Family history of ischemic heart disease and other diseases of the circulatory system: Secondary | ICD-10-CM

## 2023-07-08 DIAGNOSIS — J9601 Acute respiratory failure with hypoxia: Secondary | ICD-10-CM | POA: Diagnosis present

## 2023-07-08 DIAGNOSIS — M7989 Other specified soft tissue disorders: Secondary | ICD-10-CM | POA: Diagnosis present

## 2023-07-08 DIAGNOSIS — Z79899 Other long term (current) drug therapy: Secondary | ICD-10-CM

## 2023-07-08 DIAGNOSIS — Z7902 Long term (current) use of antithrombotics/antiplatelets: Secondary | ICD-10-CM

## 2023-07-08 DIAGNOSIS — D649 Anemia, unspecified: Secondary | ICD-10-CM | POA: Diagnosis present

## 2023-07-08 DIAGNOSIS — I447 Left bundle-branch block, unspecified: Secondary | ICD-10-CM | POA: Diagnosis present

## 2023-07-08 LAB — CBC WITH DIFFERENTIAL/PLATELET
Abs Immature Granulocytes: 0.12 10*3/uL — ABNORMAL HIGH (ref 0.00–0.07)
Basophils Absolute: 0 10*3/uL (ref 0.0–0.1)
Basophils Relative: 0 %
Eosinophils Absolute: 0.2 10*3/uL (ref 0.0–0.5)
Eosinophils Relative: 2 %
HCT: 32.1 % — ABNORMAL LOW (ref 39.0–52.0)
Hemoglobin: 10.3 g/dL — ABNORMAL LOW (ref 13.0–17.0)
Immature Granulocytes: 1 %
Lymphocytes Relative: 19 %
Lymphs Abs: 1.6 10*3/uL (ref 0.7–4.0)
MCH: 28.8 pg (ref 26.0–34.0)
MCHC: 32.1 g/dL (ref 30.0–36.0)
MCV: 89.7 fL (ref 80.0–100.0)
Monocytes Absolute: 1.3 10*3/uL — ABNORMAL HIGH (ref 0.1–1.0)
Monocytes Relative: 16 %
Neutro Abs: 5.1 10*3/uL (ref 1.7–7.7)
Neutrophils Relative %: 62 %
Platelets: 363 10*3/uL (ref 150–400)
RBC: 3.58 MIL/uL — ABNORMAL LOW (ref 4.22–5.81)
RDW: 16.4 % — ABNORMAL HIGH (ref 11.5–15.5)
WBC: 8.3 10*3/uL (ref 4.0–10.5)
nRBC: 0 % (ref 0.0–0.2)

## 2023-07-08 LAB — BASIC METABOLIC PANEL WITH GFR
Anion gap: 7 (ref 5–15)
BUN: 11 mg/dL (ref 8–23)
CO2: 33 mmol/L — ABNORMAL HIGH (ref 22–32)
Calcium: 8.9 mg/dL (ref 8.9–10.3)
Chloride: 85 mmol/L — ABNORMAL LOW (ref 98–111)
Creatinine, Ser: 0.58 mg/dL — ABNORMAL LOW (ref 0.61–1.24)
GFR, Estimated: >60 mL/min (ref >60)
Glucose, Bld: 86 mg/dL (ref 70–99)
Potassium: 5.1 mmol/L (ref 3.5–5.1)
Sodium: 125 mmol/L — ABNORMAL LOW (ref 135–145)

## 2023-07-08 LAB — TROPONIN I (HIGH SENSITIVITY)
Troponin I (High Sensitivity): 10 ng/L (ref <18)
Troponin I (High Sensitivity): 9 ng/L (ref <18)

## 2023-07-08 LAB — MAGNESIUM: Magnesium: 1.9 mg/dL (ref 1.7–2.4)

## 2023-07-08 LAB — BRAIN NATRIURETIC PEPTIDE: B Natriuretic Peptide: 324 pg/mL — ABNORMAL HIGH (ref 0.0–100.0)

## 2023-07-08 MED ORDER — ATORVASTATIN CALCIUM 40 MG PO TABS
80.0000 mg | ORAL_TABLET | Freq: Every day | ORAL | Status: DC
  Administered 2023-07-09 – 2023-07-13 (×5): 80 mg via ORAL
  Filled 2023-07-08 (×5): qty 2

## 2023-07-08 MED ORDER — CLOPIDOGREL BISULFATE 75 MG PO TABS
75.0000 mg | ORAL_TABLET | Freq: Every day | ORAL | Status: DC
  Administered 2023-07-09: 75 mg via ORAL
  Filled 2023-07-08: qty 1

## 2023-07-08 MED ORDER — ATORVASTATIN CALCIUM 80 MG PO TABS: ORAL_TABLET | ORAL | 2 refills | Status: DC

## 2023-07-08 MED ORDER — POTASSIUM CHLORIDE CRYS ER 20 MEQ PO TBCR
40.0000 meq | EXTENDED_RELEASE_TABLET | Freq: Two times a day (BID) | ORAL | Status: DC
  Administered 2023-07-09 – 2023-07-10 (×3): 40 meq via ORAL
  Filled 2023-07-08 (×3): qty 2

## 2023-07-08 MED ORDER — APIXABAN 5 MG PO TABS
5.0000 mg | ORAL_TABLET | Freq: Two times a day (BID) | ORAL | Status: DC
  Administered 2023-07-09 – 2023-07-14 (×12): 5 mg via ORAL
  Filled 2023-07-08 (×9): qty 1
  Filled 2023-07-08: qty 2
  Filled 2023-07-08 (×2): qty 1

## 2023-07-08 MED ORDER — ENSURE ENLIVE PO LIQD
237.0000 mL | Freq: Two times a day (BID) | ORAL | Status: DC
  Administered 2023-07-09 – 2023-07-14 (×10): 237 mL via ORAL

## 2023-07-08 MED ORDER — SODIUM CHLORIDE 0.9 % IV SOLN: INTRAVENOUS | Status: DC

## 2023-07-08 MED ORDER — FUROSEMIDE 10 MG/ML IJ SOLN
40.0000 mg | Freq: Once | INTRAMUSCULAR | Status: AC
  Administered 2023-07-08: 40 mg via INTRAVENOUS
  Filled 2023-07-08: qty 4

## 2023-07-08 MED ORDER — AZATHIOPRINE 50 MG PO TABS
50.0000 mg | ORAL_TABLET | Freq: Every day | ORAL | Status: DC
  Administered 2023-07-09 – 2023-07-14 (×6): 50 mg via ORAL
  Filled 2023-07-08 (×7): qty 1

## 2023-07-08 MED ORDER — PANTOPRAZOLE SODIUM 40 MG PO TBEC
40.0000 mg | DELAYED_RELEASE_TABLET | Freq: Two times a day (BID) | ORAL | Status: DC
  Administered 2023-07-09 (×2): 40 mg via ORAL
  Filled 2023-07-08 (×2): qty 1

## 2023-07-08 MED ORDER — PREDNISONE 20 MG PO TABS
20.0000 mg | ORAL_TABLET | Freq: Every day | ORAL | Status: DC
  Administered 2023-07-09: 20 mg via ORAL
  Filled 2023-07-08: qty 1

## 2023-07-08 MED ORDER — SOTALOL HCL 80 MG PO TABS
120.0000 mg | ORAL_TABLET | Freq: Two times a day (BID) | ORAL | Status: DC
  Administered 2023-07-09 (×2): 120 mg via ORAL
  Filled 2023-07-08 (×2): qty 1
  Filled 2023-07-08 (×2): qty 1.5

## 2023-07-08 MED ORDER — FUROSEMIDE 10 MG/ML IJ SOLN
40.0000 mg | Freq: Two times a day (BID) | INTRAMUSCULAR | Status: DC
  Administered 2023-07-09: 40 mg via INTRAVENOUS
  Filled 2023-07-08: qty 4

## 2023-07-08 NOTE — Telephone Encounter (Signed)
 RX sent to requested Pharmacy

## 2023-07-08 NOTE — Progress Notes (Signed)
   07/08/23 2148  TOC Brief Assessment  Insurance and Status Reviewed  Patient has primary care physician Yes  Home environment has been reviewed From home  Prior level of function: Minimal assist  Prior/Current Home Services Current home services (oxygen ([employer]))  Social Drivers of Health Review SDOH reviewed no interventions necessary  Readmission risk has been reviewed Yes  Transition of care needs no transition of care needs at this time   Pt c/pulmonary fibrosis, adenocarcinoma of the lung c/metastasis. Pt from home c/wife, has supportive children. Transition of Care Department [facility]) has reviewed patient and will continue to monitor.

## 2023-07-08 NOTE — ED Notes (Signed)
 ED TO INPATIENT HANDOFF REPORT  ED Nurse Name and Phone #: Janeal Mealing, [phone]  S Name/Age/Gender Algernon Ing 68 y.o. male Room/Bed: APA02/APA02  Code Status   Code Status: Prior  Home/SNF/Other Home Patient oriented to: self, place, time, and situation Is this baseline? Yes   Triage Complete: Triage complete  Chief Complaint Acute systolic (congestive) heart failure (HCC) [I50.21]  Triage Note Patient BIB [facility] from home, was seen two weeks ago for pneumonia, complaint of no improvement on breathing and swelling to legs. Wears 2L oxygen via nasal cannula at home baseline.   Allergies No Known Allergies  Level of Care/Admitting Diagnosis ED Disposition     ED Disposition  Admit   Condition  --   Comment  Hospital Area: [facility] [[id number]]  Level of Care: Med-Surg [16]  Covid Evaluation: Asymptomatic - no recent exposure (last 10 days) testing not required  Diagnosis: Acute systolic (congestive) heart failure [facility]) [[id number]]  Admitting Physician: Otilia Bloch [[id number]]  Attending Physician: Ambrose Junk, DAVID J [phone]  Certification:: I certify there are rare and unusual circumstances requiring inpatient admission  Expected Medical Readiness: 07/11/2023          B Medical/Surgery History Past Medical History:  Diagnosis Date   Abnormal CT scan, stomach    Chronic systolic dysfunction of left ventricle    Coronary artery disease    lateral STEMI 02/22/15 3/10 cutting balloon to ISR of the o/pLCX   GERD (gastroesophageal reflux disease)    Hyperlipidemia    Hypertension    Ischemic cardiomyopathy    LBBB (left bundle branch block)    Leukocytosis    followed by hematology, reactive   Lymphadenopathy    Myocardial infarction (HCC) 02/2015   Psoriasis 2003   Psoriatic arthritis (HCC)    Pulmonary fibrosis (HCC)    Rheumatoid arthritis(714.0) 2012   Typical atrial flutter (HCC)    Past Surgical History:  Procedure Laterality Date    CARDIAC CATHETERIZATION N/A 02/22/2015   Procedure: Left Heart Cath and Coronary Angiography;  Surgeon: Avanell Leigh, MD;  Location: [facility] INVASIVE CV LAB;  Service: Cardiovascular;  Laterality: N/A;   CARDIAC CATHETERIZATION N/A 02/22/2015   Procedure: Coronary Stent Intervention;  Surgeon: Avanell Leigh, MD;  Location: [facility] INVASIVE CV LAB;  Service: Cardiovascular;  Laterality: N/A;   CARDIOVERSION N/A 04/12/2015   Procedure: CARDIOVERSION;  Surgeon: Darlis Eisenmenger, MD;  Location: [facility] ENDOSCOPY;  Service: Cardiovascular;  Laterality: N/A;   COLONOSCOPY  07/01/2003   [id number] colonic mucosa except for the proximal right colon in the area of ICV which was not seen completely due to inadequate bowel prep. followed with ACBE which was normal.    COLONOSCOPY N/A 08/24/2015   Dr. Riley Cheadle: Normal colon. Next colonoscopy in 10 years.   CORONARY BALLOON ANGIOPLASTY N/A 05/12/2018   Procedure: CORONARY BALLOON ANGIOPLASTY;  Surgeon: Arty Binning, MD;  Location: [facility] INVASIVE CV LAB;  Service: Cardiovascular;  Laterality: N/A;   ELECTROPHYSIOLOGIC STUDY N/A 05/30/2015   Atrial fibrillation ablation by Dr Nunzio Belch   ESOPHAGOGASTRODUODENOSCOPY N/A 08/24/2015   Dr. Riley Cheadle: Medium-sized hiatal hernia, erosive gastropathy. Cameron lesions. Esophageal mucosa distally suggestive of short segment Barrett's esophagus. Not confirmed on biopsy. Gastric biopsy with minimal chronic inflammation   GIVENS CAPSULE STUDY N/A 04/17/2016   Procedure: GIVENS CAPSULE STUDY;  Surgeon: Suzette Espy, MD;  Location: [facility] ENDO SU[facility];  Service: Endoscopy;  Laterality: N/A;  Pt to arrive at 8:00 am for 8:30 am appt  LEFT HEART CATH AND CORONARY ANGIOGRAPHY N/A 05/11/2018   Procedure: LEFT HEART CATH AND CORONARY ANGIOGRAPHY;  Surgeon: Arty Binning, MD;  Location: [facility] INVASIVE CV LAB;  Service: Cardiovascular;  Laterality: N/A;   TEE WITHOUT CARDIOVERSION N/A 04/12/2015   Procedure: TRANSESOPHAGEAL ECHOCARDIOGRAM (TEE);  Surgeon: Darlis Eisenmenger, MD;  Location: [facility] ENDOSCOPY;  Service: Cardiovascular;  Laterality: N/A;     A IV Location/Drains/Wounds Patient Lines/Drains/Airways Status     Active Line/Drains/Airways     Name Placement date Placement time Site Days   Peripheral IV 07/08/23 20 G Left Antecubital 07/08/23  1524  Antecubital  less than 1            Intake/Output Last 24 hours  Intake/Output Summary (Last 24 hours) at 07/08/2023 1807 Last data filed at 07/08/2023 1737 Gross per 24 hour  Intake --  Output 300 ml  Net -300 ml    Labs/Imaging Results for orders placed or performed during the hospital encounter of 07/08/23 (from the past 48 hours)  Basic metabolic panel     Status: Abnormal   Collection Time: 07/08/23  3:36 PM  Result Value Ref Range   Sodium 125 (L) 135 - 145 mmol/L   Potassium 5.1 3.5 - 5.1 mmol/L   Chloride 85 (L) 98 - 111 mmol/L   CO2 33 (H) 22 - 32 mmol/L   Glucose, Bld 86 70 - 99 mg/dL    Comment: Glucose reference range applies only to samples taken after fasting for at least 8 hours.   BUN 11 8 - 23 mg/dL   Creatinine, Ser 0.63 (L) 0.61 - 1.24 mg/dL   Calcium  8.9 8.9 - 10.3 mg/dL   GFR, Estimated >01 >60 mL/min    Comment: (NOTE) Calculated using the CKD-EPI Creatinine Equation (2021)    Anion gap 7 5 - 15    Comment: Performed at [facility], [address], [place], [place] [address]  Brain natriuretic peptide     Status: Abnormal   Collection Time: 07/08/23  3:36 PM  Result Value Ref Range   B Natriuretic Peptide 324.0 (H) 0.0 - 100.0 pg/mL    Comment: Performed at [facility], [address] [place], [place] [address]  CBC with Differential     Status: Abnormal   Collection Time: 07/08/23  3:36 PM  Result Value Ref Range   WBC 8.3 4.0 - 10.5 K/uL   RBC 3.58 (L) 4.22 - 5.81 MIL/uL   Hemoglobin 10.3 (L) 13.0 - 17.0 g/dL   HCT 22.0 (L) 25.4 - 27.0 %   MCV 89.7 80.0 - 100.0 fL   MCH 28.8 26.0 - 34.0 pg   MCHC 32.1 30.0 - 36.0 g/dL   RDW 62.3 (H) 76.2 - 83.1 %    Platelets 363 150 - 400 K/uL   nRBC 0.0 0.0 - 0.2 %   Neutrophils Relative % 62 %   Neutro Abs 5.1 1.7 - 7.7 K/uL   Lymphocytes Relative 19 %   Lymphs Abs 1.6 0.7 - 4.0 K/uL   Monocytes Relative 16 %   Monocytes Absolute 1.3 (H) 0.1 - 1.0 K/uL   Eosinophils Relative 2 %   Eosinophils Absolute 0.2 0.0 - 0.5 K/uL   Basophils Relative 0 %   Basophils Absolute 0.0 0.0 - 0.1 K/uL   Immature Granulocytes 1 %   Abs Immature Granulocytes 0.12 (H) 0.00 - 0.07 K/uL    Comment: Performed at [facility], [address] [place], [place] [address]  Troponin I (  High Sensitivity)     Status: None   Collection Time: 07/08/23  3:36 PM  Result Value Ref Range   Troponin I (High Sensitivity) 10 <18 ng/L    Comment: (NOTE) Elevated high sensitivity troponin I (hsTnI) values and significant  changes across serial measurements may suggest ACS but many other  chronic and acute conditions are known to elevate hsTnI results.  Refer to the "Links" section for chest pain algorithms and additional  guidance. Performed at [facility], [address] [place], [place] [address]    DG Chest 2 View Result Date: 07/08/2023 CLINICAL DATA:  Shortness of breath EXAM: CHEST - 2 VIEW COMPARISON:  June 08, 2023, 4:59 a.m. FINDINGS: Persistent bilateral pulmonary infiltrates with basilar atelectasis and bilateral pleural effusions findings that could correlate with pneumonia and superimposed congestive changes. Heart and mediastinum upper limits of normal with a left ventricular configuration. IMPRESSION: Persistent pulmonary infiltrates and atelectasis Electronically Signed   By: Fredrich Jefferson M.D.   On: 07/08/2023 15:37    Pending Labs Unresulted Labs (From admission, onward)    None       Vitals/Pain Today's Vitals   07/08/23 1730 07/08/23 1745 07/08/23 1748 07/08/23 1753  BP: 103/65 101/61    Pulse: 91 84  91  Resp: (!) 28  20 20   Temp:      TempSrc:      SpO2: 98% 98%  96%  Weight:      Height:       PainSc:        Isolation Precautions No active isolations  Medications Medications  furosemide  (LASIX ) injection 40 mg (40 mg Intravenous Given 07/08/23 1634)    Mobility walks with person assist     Focused Assessments Pulmonary Assessment Handoff:  Lung sounds: Bilateral Breath Sounds: Clear, Diminished L Breath Sounds: Diminished, Clear R Breath Sounds: Diminished, Clear O2 Device: Nasal Cannula O2 Flow Rate (L/min): 2 L/min    R Recommendations: See Admitting Provider Note  Report given to:   Additional Notes:

## 2023-07-08 NOTE — ED Notes (Signed)
Urinal given to patient.

## 2023-07-08 NOTE — ED Triage Notes (Signed)
 Patient BIB [facility] from home, was seen two weeks ago for pneumonia, complaint of no improvement on breathing and swelling to legs. Wears 2L oxygen via nasal cannula at home baseline.

## 2023-07-08 NOTE — ED Notes (Signed)
 ED Provider at bedside.

## 2023-07-08 NOTE — H&P (Addendum)
 History and Physical    Curtis Flores ZOX:[id number] DOB: 12-04-1955 DOA: 07/08/2023  PCP: [name], PA-C Patient coming from: home  Chief Complaint: SOB  HPI: Curtis Flores is a 68 y.o. male with medical history significant of PAF, MI, pulmonary fibrosis, HLD, HTN, Prostate Ca. Adenocarcinoma of the lung.   Presenting w/ several day worsening SOB. No fever, CP, N/V. Associated w/ LE swelling. Taking home lasix  w/o benefit. EMS called to evaluate pt and found to be hypoxic on home O2 of 2L [place].   ED Course: Workup as below. 40mg  IV lasix  x1 given.   Review of Systems: As per HPI otherwise all other systems reviewed and are negative  Ambulatory Status:no restrictions  Past Medical History:  Diagnosis Date   Abnormal CT scan, stomach    Chronic systolic dysfunction of left ventricle    Coronary artery disease    lateral STEMI 02/22/15 3/10 cutting balloon to ISR of the o/pLCX   GERD (gastroesophageal reflux disease)    Hyperlipidemia    Hypertension    Ischemic cardiomyopathy    LBBB (left bundle branch block)    Leukocytosis    followed by hematology, reactive   Lymphadenopathy    Myocardial infarction (HCC) 02/2015   Psoriasis 2003   Psoriatic arthritis (HCC)    Pulmonary fibrosis (HCC)    Rheumatoid arthritis(714.0) 2012   Typical atrial flutter (HCC)     Past Surgical History:  Procedure Laterality Date   CARDIAC CATHETERIZATION N/A 02/22/2015   Procedure: Left Heart Cath and Coronary Angiography;  Surgeon: Avanell Leigh, MD;  [facility] INVASIVE CV LAB;  Service: Cardiovascular;  Laterality: N/A;   CARDIAC CATHETERIZATION N/A 02/22/2015   Procedure: Coronary Stent Intervention;  Surgeon: Avanell Leigh, MD;  Location: [facility] INVASIVE CV LAB;  Service: Cardiovascular;  Laterality: N/A;   CARDIOVERSION N/A 04/12/2015   Procedure: CARDIOVERSION;  Surgeon: Darlis Eisenmenger, MD;  Location: [facility] ENDOSCOPY;  Service: Cardiovascular;  Laterality: N/A;   COLONOSCOPY   07/01/2003   [id number] colonic mucosa except for the proximal right colon in the area of ICV which was not seen completely due to inadequate bowel prep. followed with ACBE which was normal.    COLONOSCOPY N/A 08/24/2015   Dr. Riley Cheadle: Normal colon. Next colonoscopy in 10 years.   CORONARY BALLOON ANGIOPLASTY N/A 05/12/2018   Procedure: CORONARY BALLOON ANGIOPLASTY;  Surgeon: Arty Binning, MD;  Location: [facility] INVASIVE CV LAB;  Service: Cardiovascular;  Laterality: N/A;   ELECTROPHYSIOLOGIC STUDY N/A 05/30/2015   Atrial fibrillation ablation by Dr Nunzio Belch   ESOPHAGOGASTRODUODENOSCOPY N/A 08/24/2015   Dr. Riley Cheadle: Medium-sized hiatal hernia, erosive gastropathy. Cameron lesions. Esophageal mucosa distally suggestive of short segment Barrett's esophagus. Not confirmed on biopsy. Gastric biopsy with minimal chronic inflammation   GIVENS CAPSULE STUDY N/A 04/17/2016   Procedure: GIVENS CAPSULE STUDY;  Surgeon: Suzette Espy, MD;  [facility] ENDO [facility];  Service: Endoscopy;  Laterality: N/A;  Pt to arrive at 8:00 am for 8:30 am appt   LEFT HEART CATH AND CORONARY ANGIOGRAPHY N/A 05/11/2018   Procedure: LEFT HEART CATH AND CORONARY ANGIOGRAPHY;  Surgeon: Arty Binning, MD;  Location: [facility] INVASIVE CV LAB;  Service: Cardiovascular;  Laterality: N/A;   TEE WITHOUT CARDIOVERSION N/A 04/12/2015   Procedure: TRANSESOPHAGEAL ECHOCARDIOGRAM (TEE);  Surgeon: Darlis Eisenmenger, MD;  Location: [facility] ENDOSCOPY;  Service: Cardiovascular;  Laterality: N/A;    Social History   Socioeconomic History   Marital status: Married    Spouse name:  Not on file   Number of children: 2   Years of education: Not on file   Highest education level: Not on file  Occupational History   Occupation: unemployed    Comment: not working do to arthritis; used to be Production designer, theatre/television/film for a Programmer, systems  Tobacco Use   Smoking status: Former    Current packs/day: 0.00    Average packs/day: 1 pack/day for 30.0 years (30.0 ttl pk-yrs)    Types:  Cigarettes    Start date: 03/20/1973    Quit date: 03/21/2003    Years since quitting: 20.3    Passive exposure: Past   Smokeless tobacco: Never  Vaping Use   Vaping status: Never Used  Substance and Sexual Activity   Alcohol use: No    Alcohol/week: 0.0 standard drinks of alcohol    Comment: H/O case of beer weekly x 20 years, quiting in 2000-ish.   Drug use: No    Comment: H/O marijuana use many years ago.   Sexual activity: Yes    Birth control/protection: None  Other Topics Concern   Not on file  Social History Narrative   Not on file   Social Drivers of Health   Financial Resource Strain: Low Risk  (04/29/2023)   Received from [facility]   Overall Financial Resource Strain (CARDIA)    Difficulty of Paying Living Expenses: Not very hard  Food Insecurity: No Food Insecurity (07/08/2023)   Hunger Vital Sign    Worried About Running Out of Food in the Last Year: Never true    Ran Out of Food in the Last Year: Never true  Transportation Needs: No Transportation Needs (07/08/2023)   PRAPARE - Administrator, Civil Service (Medical): No    Lack of Transportation (Non-Medical): No  Physical Activity: Unknown (04/29/2023)   Received from [facility]   Exercise Vital Sign    Days of Exercise per Week: 0 days    Minutes of Exercise per Session: Not on file  Stress: No Stress Concern Present (04/29/2023)   Received from [facility] - Occupational Stress Questionnaire    Feeling of Stress : Not at all  Social Connections: Moderately Integrated (07/08/2023)   Social Connection and Isolation Panel [NHANES]    Frequency of Communication with Friends and Family: Once a week    Frequency of Social Gatherings with Friends and Family: Once a week    Attends Religious Services: 1 to 4 times per year    Active Member of [employer] or Organizations: Yes    Attends Banker Meetings: Never    Marital Status: Married  Careers information officer Violence: Not At Risk (07/08/2023)   Humiliation, Afraid, Rape, and Kick questionnaire    Fear of Current or Ex-Partner: No    Emotionally Abused: No    Physically Abused: No    Sexually Abused: No    No Known Allergies  Family History  Problem Relation Age of Onset   Hypertension Mother    Colon cancer Neg Hx       Prior to Admission medications   Medication Sig Start Date End Date Taking? Authorizing Provider  amLODipine  (NORVASC ) 5 MG tablet Take 1 tablet (5 mg total) by mouth daily. 04/24/23  Yes Avanell Leigh, MD  apixaban  (ELIQUIS ) 5 MG TABS tablet TAKE 1 TABLET TWICE DAILY 05/15/23  Yes Avanell Leigh, MD  atorvastatin  (LIPITOR ) 80 MG tablet TAKE 1 TABLET EVERY DAY  AT 6 PM 07/08/23  Yes Avanell Leigh, MD  azaTHIOprine  (IMURAN ) 50 MG tablet TAKE 3 TABLETS EVERY DAY 03/31/23  Yes Dale, Taylor M, PA-C  clobetasol  (TEMOVATE ) 0.05 % external solution Apply 1 Application topically 2 (two) times daily. Patient taking differently: Apply 1 Application topically 2 (two) times daily as needed (Dermatitis). 01/24/23  Yes Romayne Clubs, PA-C  Clobetasol  Prop Emollient Base (CLOBETASOL  PROPIONATE E) 0.05 % emollient cream APPLY TO AFFECTED AREA TWICE A DAY AS NEEDED 04/04/23  Yes Romayne Clubs, PA-C  clopidogrel  (PLAVIX ) 75 MG tablet Take 1 tablet (75 mg total) by mouth daily. 04/24/23  Yes Avanell Leigh, MD  docusate sodium  (COLACE) 100 MG capsule Take 1 capsule (100 mg total) by mouth daily. 06/13/23  Yes Pokhrel, Laxman, MD  feeding supplement (ENSURE ENLIVE / ENSURE PLUS) LIQD Take 237 mLs by mouth 2 (two) times daily between meals. 06/13/23  Yes Pokhrel, Laxman, MD  furosemide  (LASIX ) 40 MG tablet TAKE 1 TABLET TWICE DAILY 06/30/23  Yes Avanell Leigh, MD  losartan  (COZAAR ) 100 MG tablet TAKE 1 TABLET EVERY DAY 05/21/22  Yes Avanell Leigh, MD  pantoprazole  (PROTONIX ) 40 MG tablet TAKE 1 TABLET TWICE DAILY 11/02/20  Yes Delman Ferns, NP  potassium chloride  SA (KLOR-CON   M) 20 MEQ tablet TAKE 2 TABLETS TWICE DAILY 10/31/22  Yes Avanell Leigh, MD  predniSONE  (DELTASONE ) 5 MG tablet Take 1 tablet (5 mg total) by mouth daily with breakfast. Patient taking differently: Take 20 mg by mouth daily with breakfast. 06/22/23  Yes Pokhrel, Laxman, MD  sotalol  (BETAPACE ) 120 MG tablet TAKE 1 TABLET EVERY 12 HOURS 10/31/22  Yes Avanell Leigh, MD    Physical Exam: Vitals:   07/08/23 1830 07/08/23 1831 07/08/23 1900 07/08/23 2054  BP: 115/68  103/63 120/65  Pulse: (!) 102 83 89 71  Resp: (!) 23  16 19   Temp:   98.7 F (37.1 C) 97.8 F (36.6 C)  TempSrc:   Oral Oral  SpO2: 98% 97% 97% 99%  Weight:      Height:         General:  Appears calm and comfortable Eyes:  PERRL, EOMI, normal lids, iris ENT:  grossly normal hearing, lips & tongue, mmm Neck:  no LAD, masses or thyromegaly Cardiovascular: Irrefularly irregular. 2+ LE edema.  Respiratory:  diffuse reduction in breath sounds, crackles. No wheezing. Abdomen:  soft, ntnd, NABS Skin:  several small open wounds on sacrum w/ suroudning induration Musculoskeletal:  grossly normal tone BUE/BLE, good ROM, no bony abnormality Psychiatric:  grossly normal mood and affect, speech fluent and appropriate, AOx3 Neurologic:  CN 2-12 grossly intact, moves all extremities in coordinated fashion, sensation intact  Labs on Admission: I have personally reviewed following labs and imaging studies  CBC: Recent Labs  Lab 07/08/23 1536  WBC 8.3  NEUTROABS 5.1  HGB 10.3*  HCT 32.1*  MCV 89.7  PLT 363   Basic Metabolic Panel: Recent Labs  Lab 07/08/23 1536  NA 125*  K 5.1  CL 85*  CO2 33*  GLUCOSE 86  BUN 11  CREATININE 0.58*  CALCIUM  8.9   GFR: Estimated Creatinine Clearance: 77.5 mL/min (A) (by C-G formula based on SCr of 0.58 mg/dL (L)). Liver Function Tests: No results for input(s): "AST", "ALT", "ALKPHOS", "BILITOT", "PROT", "ALBUMIN" in the last 168 hours. No results for input(s): "LIPASE",  "AMYLASE" in the last 168 hours. No results for input(s): "AMMONIA" in the last 168 hours. Coagulation  Profile: No results for input(s): "INR", "PROTIME" in the last 168 hours. Cardiac Enzymes: No results for input(s): "CKTOTAL", "CKMB", "CKMBINDEX", "TROPONINI" in the last 168 hours. BNP (last 3 results) No results for input(s): "PROBNP" in the last 8760 hours. HbA1C: No results for input(s): "HGBA1C" in the last 72 hours. CBG: No results for input(s): "GLUCAP" in the last 168 hours. Lipid Profile: No results for input(s): "CHOL", "HDL", "LDLCALC", "TRIG", "CHOLHDL", "LDLDIRECT" in the last 72 hours. Thyroid Function Tests: No results for input(s): "TSH", "T4TOTAL", "FREET4", "T3FREE", "THYROIDAB" in the last 72 hours. Anemia Panel: No results for input(s): "VITAMINB12", "FOLATE", "FERRITIN", "TIBC", "IRON", "RETICCTPCT" in the last 72 hours. Urine analysis:    Component Value Date/Time   COLORURINE YELLOW 01/31/2018 1200   APPEARANCEUR CLEAR 01/31/2018 1200   LABSPEC 1.005 01/31/2018 1200   PHURINE 6.0 01/31/2018 1200   GLUCOSEU NEGATIVE 01/31/2018 1200   HGBUR NEGATIVE 01/31/2018 1200   BILIRUBINUR NEGATIVE 01/31/2018 1200   KETONESUR NEGATIVE 01/31/2018 1200   PROTEINUR NEGATIVE 01/31/2018 1200   NITRITE NEGATIVE 01/31/2018 1200   LEUKOCYTESUR NEGATIVE 01/31/2018 1200    Creatinine Clearance: Estimated Creatinine Clearance: 77.5 mL/min (A) (by C-G formula based on SCr of 0.58 mg/dL (L)).  Sepsis Labs: @LABRCNTIP (procalcitonin:4,lacticidven:4) )No results found for this or any previous visit (from the past 240 hours).   Radiological Exams on Admission: DG Chest 2 View Result Date: 07/08/2023 CLINICAL DATA:  Shortness of breath EXAM: CHEST - 2 VIEW COMPARISON:  June 08, 2023, 4:59 a.m. FINDINGS: Persistent bilateral pulmonary infiltrates with basilar atelectasis and bilateral pleural effusions findings that could correlate with pneumonia and superimposed congestive  changes. Heart and mediastinum upper limits of normal with a left ventricular configuration. IMPRESSION: Persistent pulmonary infiltrates and atelectasis Electronically Signed   By: Fredrich Jefferson M.D.   On: 07/08/2023 15:37    EKG: Independently reviewed. 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 Likely from combination of Pulmonary fibrosis, decompensated CHF in conjunction w/ recent CAP and possible pulmonary adenocarcinoma. Baseline 2L [place]. Improved. W/ lasix  40mg  IV given in ED. DC on 4/11/ for HCAP. Doubt recurrence of HCAP given afebrile and nml WBC.  - Wean O2 as able - mgt acute decompensated CHF.  - continue Azathioprine , Prednisone   Acute on Chronic combined systolic and diastolic CHF. Acute decompensation from ongoing illness and poor overal cardiac function. Echo from 03/2022 showing EF 35% and Grade 1 diastolic dysfunction. Baseline Lasix  40mg  PO BID . - Strict I/O - IV lasix  40mg  BID - Contineu home sotalol  - Resume ARB when BP improves.   Decubitus ulcers: Chronic.  - Wound care.   Prostate Ca / Adenocarcinoma Lung: known metastatic prostate cancer. 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  - No active mgt - defer to ongoing Oncology workup  Hyponatremia/hypochloremia: 125/85 respectively on admission. Likely from ongoing lasix  w/ poor oral intake for several days.  - NS 33ml/hr overnight - Monitor cloesely due to ongoing diuresis for ARF.  - BMP in am.   HTN: - hold home Losartan  and Norvasc  as diuresing is effecting BP.   PAF/CAD: currently in Afib. H/o  cath/stent  placement - continue sotalol , Eliquis , ASA,   Anemia: Normocytic. Chronic disease. 10.3 on admission. Baseline 11.3.  - Monitor for now. Likely worse due to chronic illness w/ acute worsening recently.  - CBC daily  DVT prophylaxis: Eliquis   Code Status: full  Family Communication: Wife and duaghter in Social worker  Disposition Plan: penidng return to baseline Resp  Consults called: none  Admission status: inpt    Curtis Bloch MD [facility]  If 7PM-7AM, please contact night-coverage [email or web address] Password [facility]  07/08/2023, 9:33 PM

## 2023-07-08 NOTE — ED Notes (Signed)
 Patient transported to X-ray

## 2023-07-08 NOTE — ED Provider Notes (Signed)
 [place] [facility]ERGENCY DEPARTMENT AT [facility] Provider Note   CSN: [id number] Arrival date & time: 07/08/23  1438     History  Chief Complaint  Patient presents with   Shortness of Breath   Leg Swelling    Curtis Flores is a 68 y.o. male.  Has a history of A-fib on Eliquis , CAD, pulmonary fibrosis, RA.  He is on baseline 2 L of nasal cannula oxygen, HFrEF with EF of 35 to 40% in November 2024.  Presents the ER today for worsening shortness of breath and bilateral lower extremity edema that has been gradually worsening since discharge from the hospital on 4/11.  He is admitted for pneumonia, found to have pleural effusion which was drained and positive for adenocarcinoma.  He has been undergoing workup with oncology for metastatic disease as he also has multiple sclerotic bony lesions.  He is compliant with his medications including his Lasix .  Denies chest pain, denies fever or chills, denies cough.  He states he is having trouble laying flat to sleep and needing to sit straight up.  He states when he gets up to walk around he has to turn his oxygen up to 3 L.   Shortness of Breath      Home Medications Prior to Admission medications   Medication Sig Start Date End Date Taking? Authorizing Provider  amLODipine  (NORVASC ) 5 MG tablet Take 1 tablet (5 mg total) by mouth daily. 04/24/23  Yes Avanell Leigh, MD  apixaban  (ELIQUIS ) 5 MG TABS tablet TAKE 1 TABLET TWICE DAILY 05/15/23  Yes Avanell Leigh, MD  atorvastatin  (LIPITOR ) 80 MG tablet TAKE 1 TABLET EVERY DAY AT 6 PM 07/08/23  Yes Avanell Leigh, MD  azaTHIOprine  (IMURAN ) 50 MG tablet TAKE 3 TABLETS EVERY DAY 03/31/23  Yes Romayne Clubs, PA-C  clobetasol  (TEMOVATE ) 0.05 % external solution Apply 1 Application topically 2 (two) times daily. Patient taking differently: Apply 1 Application topically 2 (two) times daily as needed (Dermatitis). 01/24/23  Yes Romayne Clubs, PA-C  Clobetasol  Prop Emollient Base  (CLOBETASOL  PROPIONATE E) 0.05 % emollient cream APPLY TO AFFECTED AREA TWICE A DAY AS NEEDED 04/04/23  Yes Romayne Clubs, PA-C  clopidogrel  (PLAVIX ) 75 MG tablet Take 1 tablet (75 mg total) by mouth daily. 04/24/23  Yes Avanell Leigh, MD  docusate sodium  (COLACE) 100 MG capsule Take 1 capsule (100 mg total) by mouth daily. 06/13/23  Yes Pokhrel, Laxman, MD  feeding supplement (ENSURE ENLIVE / ENSURE PLUS) LIQD Take 237 mLs by mouth 2 (two) times daily between meals. 06/13/23  Yes Pokhrel, Laxman, MD  furosemide  (LASIX ) 40 MG tablet TAKE 1 TABLET TWICE DAILY 06/30/23  Yes Avanell Leigh, MD  losartan  (COZAAR ) 100 MG tablet TAKE 1 TABLET EVERY DAY 05/21/22  Yes Avanell Leigh, MD  pantoprazole  (PROTONIX ) 40 MG tablet TAKE 1 TABLET TWICE DAILY 11/02/20  Yes Delman Ferns, NP  potassium chloride  SA (KLOR-CON  M) 20 MEQ tablet TAKE 2 TABLETS TWICE DAILY 10/31/22  Yes Avanell Leigh, MD  predniSONE  (DELTASONE ) 5 MG tablet Take 1 tablet (5 mg total) by mouth daily with breakfast. Patient taking differently: Take 20 mg by mouth daily with breakfast. 06/22/23  Yes Pokhrel, Laxman, MD  sotalol  (BETAPACE ) 120 MG tablet TAKE 1 TABLET EVERY 12 HOURS 10/31/22  Yes Avanell Leigh, MD      Allergies    Patient has no known allergies.    Review of Systems   Review  of Systems  Respiratory:  Positive for shortness of breath.     Physical Exam Updated Vital Signs BP 103/63 (BP Location: Left Arm)   Pulse 89   Temp 98.7 F (37.1 C) (Oral)   Resp 16   Ht 5\' 6"  (1.676 m)   Wt 72.5 kg   SpO2 97%   BMI 25.80 kg/m  Physical Exam Vitals and nursing note reviewed.  Constitutional:      General: He is not in acute distress.    Appearance: He is well-developed.  HENT:     Head: Normocephalic and atraumatic.  Eyes:     Extraocular Movements: Extraocular movements intact.     Conjunctiva/sclera: Conjunctivae normal.     Pupils: Pupils are equal, round, and reactive to light.  Cardiovascular:      Rate and Rhythm: Normal rate and regular rhythm.     Heart sounds: No murmur heard. Pulmonary:     Effort: Pulmonary effort is normal. No respiratory distress.     Breath sounds: Examination of the left-lower field reveals rales. Rales present. No decreased breath sounds, wheezing or rhonchi.  Abdominal:     Palpations: Abdomen is soft.     Tenderness: There is no abdominal tenderness.  Musculoskeletal:        General: No swelling.     Cervical back: Neck supple.     Right lower leg: Edema present.     Left lower leg: Edema present.  Skin:    General: Skin is warm and dry.     Capillary Refill: Capillary refill takes less than 2 seconds.  Neurological:     General: No focal deficit present.     Mental Status: He is alert and oriented to person, place, and time.  Psychiatric:        Mood and Affect: Mood normal.     ED Results / Procedures / Treatments   Labs (all labs ordered are listed, but only abnormal results are displayed) Labs Reviewed  BASIC METABOLIC PANEL WITH GFR - Abnormal; Notable for the following components:      Result Value   Sodium 125 (*)    Chloride 85 (*)    CO2 33 (*)    Creatinine, Ser 0.58 (*)    All other components within normal limits  BRAIN NATRIURETIC PEPTIDE - Abnormal; Notable for the following components:   B Natriuretic Peptide 324.0 (*)    All other components within normal limits  CBC WITH DIFFERENTIAL/PLATELET - Abnormal; Notable for the following components:   RBC 3.58 (*)    Hemoglobin 10.3 (*)    HCT 32.1 (*)    RDW 16.4 (*)    Monocytes Absolute 1.3 (*)    Abs Immature Granulocytes 0.12 (*)    All other components within normal limits  TROPONIN I (HIGH SENSITIVITY)  TROPONIN I (HIGH SENSITIVITY)    EKG EKG Interpretation Date/Time:  Tuesday Jul 08 2023 15:50:49 EDT Ventricular Rate:  93 PR Interval:    QRS Duration:  92 QT Interval:  366 QTC Calculation: 455 R Axis:   27  Text Interpretation: Atrial fibrillation  Left  bundle branch block   Confirmed by Annita Kindle [phone]) on 07/08/2023 5:16:16 PM  Radiology DG Chest 2 View Result Date: 07/08/2023 CLINICAL DATA:  Shortness of breath EXAM: CHEST - 2 VIEW COMPARISON:  June 08, 2023, 4:59 a.m. FINDINGS: Persistent bilateral pulmonary infiltrates with basilar atelectasis and bilateral pleural effusions findings that could correlate with pneumonia and superimposed congestive changes. Heart and  mediastinum upper limits of normal with a left ventricular configuration. IMPRESSION: Persistent pulmonary infiltrates and atelectasis Electronically Signed   By: Fredrich Jefferson M.D.   On: 07/08/2023 15:37    Procedures Procedures    Medications Ordered in ED Medications  furosemide  (LASIX ) injection 40 mg (40 mg Intravenous Given 07/08/23 1634)    ED Course/ Medical Decision Making/ A&P                                 Medical Decision Making This patient presents to the ED for concern of increased shortness of breath and lower extremity swelling, this involves an extensive number of treatment options, and is a complaint that carries with it a high risk of complications and morbidity.  The differential diagnosis includes heart failure, DVT, PE, cellulitis, ACS, pneumonia, pleural effusion, other   Co morbidities that complicate the patient evaluation :   CHF, pulmonary fibrosis, A-fib on Eliquis    Additional history obtained:  Additional history obtained from EMR External records from outside source obtained and reviewed including prior notes and labs, prior imaging, echocardiogram   Lab Tests:  I Ordered, and personally interpreted labs.  The pertinent results include:   CBC-hemoglobin decreased 10.3, baseline is 11 BMP shows hyponatremia, sodium is 125, chloride 85  Troponin negative BNP elevated at 324   Imaging Studies ordered:  I ordered imaging studies including chest x-ray which shows bilateral pleural effusions, persistent bilateral pulmonary  infiltrates and bibasilar atelectasis I independently visualized and interpreted imaging within scope of identifying emergent findings  I agree with the radiologist interpretation   Cardiac Monitoring: / EKG:  The patient was maintained on a cardiac monitor.  I personally viewed and interpreted the cardiac monitored which showed an underlying rhythm of: Atrial fibrillation left bundle branch block   Consultations Obtained:  I requested consultation with the specialist,  and discussed lab and imaging findings as well as pertinent plan - they recommend: Admission for diuresis   Problem List / ED Course / Critical interventions / Medication management  Increase shortness of breath-patient has malignant effusions, has CHF, and noted to have significant increase in his bilateral pitting edema since discharge from the hospital last several weeks ago.  Having to sit upright to sleep and increased oxygen when he walks at home.  BNP is elevated, chest x-ray have some persistent infiltrates which could be congestive changes.  Given IV Lasix  and has improved.  Discussed with patient could be purely volume overload versus volume overload with component of the pleural effusions.  Patient and family adamantly want admission I feel this is reasonable, as he is doing poorly at home, and with the pleural effusions, simply increased Lasix  may not be enough.  Considered PE but the patient is not having any chest pain or pleurisy, not tachycardic or tachypneic, is already on Eliquis , feel this is more likely his heart failure I ordered medication including Lasix  for volume overload Reevaluation of the patient after these medicines showed that the patient improved I have reviewed the patients home medicines and have made adjustments as needed       Amount and/or Complexity of Data Reviewed Labs: ordered. Radiology: ordered.  Risk Prescription drug management. Decision regarding  hospitalization.           Final Clinical Impression(s) / ED Diagnoses Final diagnoses:  Acute on chronic heart failure, unspecified heart failure type (HCC)    Rx / DC  Orders ED Discharge Orders     None         Joshua Nieves 07/08/23 1919    Merdis Stalling, MD 07/08/23 [phone]

## 2023-07-08 NOTE — Telephone Encounter (Signed)
*  STAT* If patient is at the pharmacy, call can be transferred to refill team.   1. Which medications need to be refilled? (please list name of each medication and dose if known)  atorvastatin (LIPITOR) 80 MG tablet   2. Which pharmacy/location (including street and city if local pharmacy) is medication to be sent to? [id number] - [place],  - [address]   3. Do they need a 30 day or 90 day supply? 90  

## 2023-07-09 ENCOUNTER — Inpatient Hospital Stay: Admitting: Hematology

## 2023-07-09 DIAGNOSIS — I484 Atypical atrial flutter: Secondary | ICD-10-CM

## 2023-07-09 DIAGNOSIS — I5023 Acute on chronic systolic (congestive) heart failure: Secondary | ICD-10-CM

## 2023-07-09 DIAGNOSIS — I2510 Atherosclerotic heart disease of native coronary artery without angina pectoris: Secondary | ICD-10-CM | POA: Diagnosis not present

## 2023-07-09 DIAGNOSIS — I255 Ischemic cardiomyopathy: Secondary | ICD-10-CM | POA: Diagnosis not present

## 2023-07-09 DIAGNOSIS — I5021 Acute systolic (congestive) heart failure: Secondary | ICD-10-CM | POA: Diagnosis not present

## 2023-07-09 LAB — BASIC METABOLIC PANEL WITH GFR
Anion gap: 7 (ref 5–15)
BUN: 11 mg/dL (ref 8–23)
CO2: 34 mmol/L — ABNORMAL HIGH (ref 22–32)
Calcium: 8.5 mg/dL — ABNORMAL LOW (ref 8.9–10.3)
Chloride: 85 mmol/L — ABNORMAL LOW (ref 98–111)
Creatinine, Ser: 0.6 mg/dL — ABNORMAL LOW (ref 0.61–1.24)
GFR, Estimated: >60 mL/min (ref >60)
Glucose, Bld: 78 mg/dL (ref 70–99)
Potassium: 4.5 mmol/L (ref 3.5–5.1)
Sodium: 126 mmol/L — ABNORMAL LOW (ref 135–145)

## 2023-07-09 LAB — CBC
HCT: 31 % — ABNORMAL LOW (ref 39.0–52.0)
Hemoglobin: 9.9 g/dL — ABNORMAL LOW (ref 13.0–17.0)
MCH: 28.8 pg (ref 26.0–34.0)
MCHC: 31.9 g/dL (ref 30.0–36.0)
MCV: 90.1 fL (ref 80.0–100.0)
Platelets: 344 10*3/uL (ref 150–400)
RBC: 3.44 MIL/uL — ABNORMAL LOW (ref 4.22–5.81)
RDW: 16.4 % — ABNORMAL HIGH (ref 11.5–15.5)
WBC: 7.9 10*3/uL (ref 4.0–10.5)
nRBC: 0 % (ref 0.0–0.2)

## 2023-07-09 LAB — COMPREHENSIVE METABOLIC PANEL WITH GFR
ALT: 45 U/L — ABNORMAL HIGH (ref 0–44)
AST: 47 U/L — ABNORMAL HIGH (ref 15–41)
Albumin: 2.4 g/dL — ABNORMAL LOW (ref 3.5–5.0)
Alkaline Phosphatase: 183 U/L — ABNORMAL HIGH (ref 38–126)
Anion gap: 9 (ref 5–15)
BUN: 19 mg/dL (ref 8–23)
CO2: 33 mmol/L — ABNORMAL HIGH (ref 22–32)
Calcium: 8.5 mg/dL — ABNORMAL LOW (ref 8.9–10.3)
Chloride: 83 mmol/L — ABNORMAL LOW (ref 98–111)
Creatinine, Ser: 0.49 mg/dL — ABNORMAL LOW (ref 0.61–1.24)
GFR, Estimated: >60 mL/min (ref >60)
Glucose, Bld: 162 mg/dL — ABNORMAL HIGH (ref 70–99)
Potassium: 4.8 mmol/L (ref 3.5–5.1)
Sodium: 125 mmol/L — ABNORMAL LOW (ref 135–145)
Total Bilirubin: 0.5 mg/dL (ref 0.0–1.2)
Total Protein: 6.3 g/dL — ABNORMAL LOW (ref 6.5–8.1)

## 2023-07-09 LAB — NA AND K (SODIUM & POTASSIUM), RAND UR
Potassium Urine: 33 mmol/L
Sodium, Ur: 82 mmol/L

## 2023-07-09 LAB — PROCALCITONIN: Procalcitonin: <0.1 ng/mL

## 2023-07-09 LAB — OSMOLALITY, URINE: Osmolality, Ur: 300 mosm/kg (ref 300–900)

## 2023-07-09 LAB — OSMOLALITY: Osmolality: 283 mosm/kg (ref 275–295)

## 2023-07-09 MED ORDER — IPRATROPIUM BROMIDE 0.02 % IN SOLN
0.5000 mg | Freq: Four times a day (QID) | RESPIRATORY_TRACT | Status: DC
  Administered 2023-07-09 – 2023-07-10 (×6): 0.5 mg via RESPIRATORY_TRACT
  Filled 2023-07-09 (×6): qty 2.5

## 2023-07-09 MED ORDER — ALBUMIN HUMAN 5 % IV SOLN
25.0000 g | Freq: Once | INTRAVENOUS | Status: AC
  Administered 2023-07-09: 25 g via INTRAVENOUS
  Filled 2023-07-09: qty 500

## 2023-07-09 MED ORDER — PREDNISONE 10 MG PO TABS
50.0000 mg | ORAL_TABLET | Freq: Every day | ORAL | Status: DC
  Administered 2023-07-10: 50 mg via ORAL
  Filled 2023-07-09: qty 1

## 2023-07-09 MED ORDER — ALBUMIN HUMAN 5 % IV SOLN
25.0000 g | Freq: Once | INTRAVENOUS | Status: DC
  Filled 2023-07-09 (×3): qty 500

## 2023-07-09 MED ORDER — OXYCODONE-ACETAMINOPHEN 5-325 MG PO TABS
1.0000 | ORAL_TABLET | Freq: Four times a day (QID) | ORAL | Status: DC | PRN
  Administered 2023-07-09 – 2023-07-12 (×4): 1 via ORAL
  Filled 2023-07-09 (×4): qty 1

## 2023-07-09 MED ORDER — MAGNESIUM SULFATE 2 GM/50ML IV SOLN
2.0000 g | Freq: Once | INTRAVENOUS | Status: AC
  Administered 2023-07-09: 2 g via INTRAVENOUS
  Filled 2023-07-09: qty 50

## 2023-07-09 MED ORDER — ZINC OXIDE 40 % EX OINT
TOPICAL_OINTMENT | Freq: Three times a day (TID) | CUTANEOUS | Status: DC
  Filled 2023-07-09: qty 57

## 2023-07-09 MED ORDER — CLOPIDOGREL BISULFATE 75 MG PO TABS
75.0000 mg | ORAL_TABLET | Freq: Every day | ORAL | Status: DC
Start: 2023-07-11 — End: 2023-07-14
  Administered 2023-07-11 – 2023-07-14 (×4): 75 mg via ORAL
  Filled 2023-07-09 (×4): qty 1

## 2023-07-09 MED ORDER — SODIUM CHLORIDE 1 G PO TABS
1.0000 g | ORAL_TABLET | Freq: Two times a day (BID) | ORAL | Status: DC
  Administered 2023-07-09 – 2023-07-10 (×2): 1 g via ORAL
  Filled 2023-07-09 (×2): qty 1

## 2023-07-09 MED ORDER — METHYLPREDNISOLONE SODIUM SUCC 40 MG IJ SOLR
40.0000 mg | Freq: Once | INTRAMUSCULAR | Status: AC
  Administered 2023-07-09: 40 mg via INTRAVENOUS
  Filled 2023-07-09: qty 1

## 2023-07-09 MED ORDER — ZINC OXIDE 40 % EX OINT
TOPICAL_OINTMENT | Freq: Three times a day (TID) | CUTANEOUS | Status: DC
  Administered 2023-07-09 – 2023-07-10 (×2): 1 via TOPICAL
  Filled 2023-07-09: qty 57

## 2023-07-09 MED ORDER — PANTOPRAZOLE SODIUM 40 MG PO TBEC
40.0000 mg | DELAYED_RELEASE_TABLET | Freq: Every day | ORAL | Status: DC
  Administered 2023-07-10 – 2023-07-14 (×5): 40 mg via ORAL
  Filled 2023-07-09 (×5): qty 1

## 2023-07-09 MED ORDER — LEVALBUTEROL HCL 1.25 MG/0.5ML IN NEBU
1.2500 mg | INHALATION_SOLUTION | Freq: Four times a day (QID) | RESPIRATORY_TRACT | Status: DC
  Administered 2023-07-09 – 2023-07-10 (×6): 1.25 mg via RESPIRATORY_TRACT
  Filled 2023-07-09 (×6): qty 0.5

## 2023-07-09 MED ORDER — HYDROMORPHONE HCL 1 MG/ML IJ SOLN: 1.0000 mg | INTRAMUSCULAR | Status: DC | PRN

## 2023-07-09 MED ORDER — FUROSEMIDE 10 MG/ML IJ SOLN
80.0000 mg | Freq: Two times a day (BID) | INTRAMUSCULAR | Status: DC
  Administered 2023-07-09 – 2023-07-12 (×7): 80 mg via INTRAVENOUS
  Filled 2023-07-09 (×8): qty 8

## 2023-07-09 NOTE — Plan of Care (Signed)
  Problem: Acute Rehab OT Goals (only OT should resolve) Goal: Pt. Will Perform Grooming Flowsheets (Taken 07/09/2023 1144) Pt Will Perform Grooming: with modified independence Goal: Pt. Will Perform Upper Body Dressing Flowsheets (Taken 07/09/2023 1144) Pt Will Perform Upper Body Dressing: with modified independence Goal: Pt. Will Perform Lower Body Dressing Flowsheets (Taken 07/09/2023 1144) Pt Will Perform Lower Body Dressing: with modified independence Goal: Pt. Will Transfer To Toilet Flowsheets (Taken 07/09/2023 1144) Pt Will Transfer to Toilet: with modified independence Goal: Pt. Will Perform Toileting-Clothing Manipulation Flowsheets (Taken 07/09/2023 1144) Pt Will Perform Toileting - Clothing Manipulation and hygiene: with modified independence Goal: Pt/Caregiver Will Perform Home Exercise Program Flowsheets (Taken 07/09/2023 1144) Pt/caregiver will Perform Home Exercise Program:  Increased strength  Both right and left upper extremity  Independently  Railee Bonillas OT, MOT

## 2023-07-09 NOTE — Plan of Care (Signed)

## 2023-07-09 NOTE — Evaluation (Signed)
 Occupational Therapy Evaluation Patient Details Name: Curtis Flores MRN: [id number] DOB: [birth year] Today's Date: 07/09/2023   History of Present Illness   Curtis Flores is a 68 y.o. male with medical history significant of PAF, MI, pulmonary fibrosis, HLD, HTN, Prostate Ca. Adenocarcinoma of the lung.      Presenting w/ several day worsening SOB. No fever, CP, N/V. Associated w/ LE swelling. Taking home lasix  w/o benefit. EMS called to evaluate pt and found to be hypoxic on home O2 of 2L [place]. (per MD)     Clinical Impressions Pt agreeable to OT and PT co-evaluation. Pt lives with his spouse who can provide 24/7 support. Pt is generally weak with reports of decreased ability to care for himself in the past couple months. Pt demosntrated 3+/5 B shoulder flexion strength. Pt on 2L in the room upon start of session. Noted to desaturate to ~83% after ambulation in the hall without O2. Able to quickly return to above 90% SpO2 while seated and returned to 2 L supplemental O2. Able to complete ADL's but with supervision to mod I assist when standing for safety. CGA for ambulation in the hall with RW used towards the end of ambulation. Pt left in the chair with call bell within reach and MD present. Pt will benefit from continued OT in the hospital and recommended venue below to increase strength, balance, and endurance for safe ADL's.        If plan is discharge home, recommend the following:   A little help with bathing/dressing/bathroom;A little help with walking and/or transfers;Assist for transportation;Help with stairs or ramp for entrance;Assistance with cooking/housework     Functional Status Assessment   Patient has had a recent decline in their functional status and demonstrates the ability to make significant improvements in function in a reasonable and predictable amount of time.     Equipment Recommendations   None recommended by OT              Precautions/Restrictions   Precautions Precautions: Fall Recall of Precautions/Restrictions: Intact Restrictions Weight Bearing Restrictions Per Provider Order: No     Mobility Bed Mobility Overal bed mobility: Needs Assistance Bed Mobility: Supine to Sit, Sit to Supine     Supine to sit: HOB elevated Sit to supine: HOB elevated   General bed mobility comments: HOB elevated per pt's report of difficulty breathing if not elevated; labored movement and extended time needed.    Transfers Overall transfer level: Needs assistance   Transfers: Sit to/from Stand, Bed to chair/wheelchair/BSC Sit to Stand: Supervision, Modified independent (Device/Increase time)     Step pivot transfers: Supervision, Modified independent (Device/Increase time)     General transfer comment: Supervision for safety due to pt's level of weakness and mild labored movement.      Balance Overall balance assessment: Needs assistance Sitting-balance support: No upper extremity supported, Feet supported Sitting balance-Leahy Scale: Good Sitting balance - Comments: seated at EOB   Standing balance support: No upper extremity supported, During functional activity Standing balance-Leahy Scale: Fair Standing balance comment: fair to good without AD                           ADL either performed or assessed with clinical judgement   ADL Overall ADL's : Needs assistance/impaired     Grooming: Supervision/safety;Standing   Upper Body Bathing: Modified independent;Sitting   Lower Body Bathing: Set up;Sitting/lateral leans   Upper Body Dressing : Modified independent;Sitting  Lower Body Dressing: Set up;Sitting/lateral leans   Toilet Transfer: Contact guard assist;Stand-pivot Toilet Transfer Details (indicate cue type and reason): Simulated via chair to EOB without AD Toileting- Clothing Manipulation and Hygiene: Set up;Sitting/lateral lean       Functional mobility during ADLs:  Contact guard assist;Rolling walker (2 wheels) General ADL Comments: Pt able to ambulate over 100 feet in the hall without RW at first but used towards the end of ambulation.     Vision Baseline Vision/History: 6 Macular Degeneration Ability to See in Adequate Light: 3 Highly impaired Patient Visual Report: No change from baseline Additional Comments: highly impaired at baseline     Perception Perception: Not tested       Praxis Praxis: Not tested       Pertinent Vitals/Pain Pain Assessment Pain Assessment: No/denies pain     Extremity/Trunk Assessment Upper Extremity Assessment Upper Extremity Assessment: Generalized weakness (Very weak B UE. Full A/ROM but very weak 3+/5 shoulder flexion.)   Lower Extremity Assessment Lower Extremity Assessment: Defer to PT evaluation   Cervical / Trunk Assessment Cervical / Trunk Assessment: Normal   Communication Communication Communication: No apparent difficulties   Cognition Arousal: Alert Behavior During Therapy: WFL for tasks assessed/performed Cognition: No apparent impairments                               Following commands: Intact       Cueing  General Comments   Cueing Techniques: Verbal cues                 Home Living Family/patient expects to be discharged to:: Private residence Living Arrangements: Spouse/significant other Available Help at Discharge: Family;Available 24 hours/day Type of Home: House Home Access: Level entry     Home Layout: One level     Bathroom Shower/Tub: Chief Strategy Officer: Standard Bathroom Accessibility: No   Home Equipment: Cane - single point          Prior Functioning/Environment Prior Level of Function : Needs assist       Physical Assist : ADLs (physical)   ADLs (physical): IADLs Mobility Comments: independent ADLs Comments: Pt reports increased difficulty over the past 1 to 2 months. Prior to this the pt was independent with  everything.    OT Problem List: Cardiopulmonary status limiting activity;Decreased strength;Decreased activity tolerance;Impaired balance (sitting and/or standing)   OT Treatment/Interventions: Self-care/ADL training;Therapeutic exercise;Therapeutic activities;Patient/family education;Balance training;DME and/or AE instruction;Energy conservation;Visual/perceptual remediation/compensation      OT Goals(Current goals can be found in the care plan section)   Acute Rehab OT Goals Patient Stated Goal: improve strength OT Goal Formulation: With patient Time For Goal Achievement: 07/23/23 Potential to Achieve Goals: Good   OT Frequency:  Min 2X/week    Co-evaluation PT/OT/SLP Co-Evaluation/Treatment: Yes Reason for Co-Treatment: To address functional/ADL transfers   OT goals addressed during session: ADL's and self-care                       End of Session Equipment Utilized During Treatment: Rolling walker (2 wheels);Oxygen  Activity Tolerance: Patient tolerated treatment well Patient left: in chair;with call bell/phone within reach;Other (comment) (MD in the room)  OT Visit Diagnosis: Unsteadiness on feet (R26.81);Other abnormalities of gait and mobility (R26.89);Muscle weakness (generalized) (M62.81)                Time: 4696-2952 OT Time Calculation (min): 24 min Charges:  OT General Charges $OT Visit: 1 Visit OT Evaluation $OT Eval Low Complexity: 1 Low  Amsi Grimley OT, MOT   Thurnell Floss 07/09/2023, 11:40 AM

## 2023-07-09 NOTE — Progress Notes (Signed)
 PROGRESS NOTE    Patient: Curtis Flores                            PCP: [name], PA-C                    DOB: Mar 08, 1955            DOA: 07/08/2023 ZOX:[id number]             DOS: 07/09/2023, 12:15 PM   LOS: 1 day   Date of Service: The patient was seen and examined on 07/09/2023  Subjective:   The patient was seen and examined this morning. Hemodynamically stable. No issues overnight .  Brief Narrative:   Curtis Flores is a 68 y.o. male with medical history significant of PAF, MI, pulmonary fibrosis, HLD, HTN, Prostate Ca. Adenocarcinoma of the lung.    Presenting w/ several day worsening SOB. No fever, CP, N/V. Associated w/ LE swelling. Taking home lasix  w/o benefit. EMS called to evaluate pt and found to be hypoxic on home O2 of 2L [place].    ED Course: Workup as below. 40mg  IV lasix  x1 given.      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 Likely from combination of Pulmonary fibrosis, decompensated CHF in conjunction w/ recent CAP and possible pulmonary adenocarcinoma.  -Baseline 2L [place]. Improved.  - Requiring 3 L of oxygen, satting 100%, -Anemia IV antibiotics for HCAP -DuoNeb bronchodilators, mucolytics, - - Acute decompensated CHF : Continue IV Lasix  40 mg twice daily, monitoring I's and O's, daily weight   - continue Azathioprine , Prednisone  (1 dose IV Solu-Medrol  today)   Acute on Chronic combined systolic and diastolic CHF. Acute decompensation from ongoing illness and poor overal cardiac function.  Echo from 03/2022 showing EF 35% and Grade 1 diastolic dysfunction.  Baseline Lasix   40mg  PO BID . - Strict I/O - IV lasix  40mg  BID - Contineu home sotalol  - Resume ARB when BP improves.  -Consulting cardiology appreciate further evaluation recommendations   Intake/Output Summary (Last 24 hours) at 07/09/2023 1213 Last data filed at 07/09/2023 0931 Gross per 24 hour  Intake 600 ml  Output 1100 ml  Net -500 ml        Decubitus ulcers: Chronic.  - Wound care.    Prostate Ca / Adenocarcinoma Lung: known metastatic prostate cancer. 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  - No active mgt - defer to ongoing Oncology workup -Has a follow-up appointment next week with Dr. Cheree Cords   Hyponatremia/hypochloremia: 125/85 respectively on admission. Likely from ongoing lasix  w/ poor oral intake for several days.  - NS 51ml/hr overnight - Monitor cloesely due to ongoing diuresis for ARF.  - Urine sodium 125, 126  HTN: - hold home Losartan  and Norvasc  as diuresing    PAF/CAD: currently in Afib. H/o cath/stent placement - continue sotalol , Eliquis , (possible discontinue home medication Plavix )   Anemia: Normocytic. Chronic disease.  POA hgb 10.3 on admission. Baseline 11.3.  - Monitor for now. Likely worse due to chronic illness w/ acute worsening  recently.  -    ----------------------------------------------------------------------------------------------------------------------------------------------- Nutritional status:  The patient's BMI is: Body mass index is 26.15 kg/m. I agree with the assessment and plan as outlined   DVT prophylaxis:  apixaban  (ELIQUIS ) tablet 5 mg   Code Status:   Code Status: Full Code  Family Communication: -Advance care planning has been discussed.   Admission status:   Status is: Inpatient Remains inpatient appropriate because: Needing IV diuretics, respiratory support   Disposition: From  - home             Planning for discharge in 1-2 days: to   Procedures:   No admission procedures for  hospital encounter.   Antimicrobials:  Anti-infectives (From admission, onward)    None        Medication:   apixaban   5 mg Oral BID   atorvastatin   80 mg Oral q1800   azaTHIOprine   50 mg Oral Daily   [START ON 07/11/2023] clopidogrel   75 mg Oral Daily   feeding supplement  237 mL Oral BID BM   furosemide   80 mg Intravenous BID   ipratropium  0.5 mg Nebulization Q6H   levalbuterol  1.25 mg Nebulization Q6H   methylPREDNISolone  (SOLU-MEDROL ) injection  40 mg Intravenous Once   [START ON 07/10/2023] pantoprazole   40 mg Oral Daily   potassium chloride  SA  40 mEq Oral BID   [START ON 07/10/2023] predniSONE   50 mg Oral Q breakfast       Objective:   Vitals:   07/09/23 0214 07/09/23 0454 07/09/23 0543 07/09/23 0823  BP: (!) 123/49 (!) 119/46 (!) 122/47 (!) 120/55  Pulse: (!) 56 (!) 57 64 (!) 56  Resp: 16 18 17 16   Temp: 97.8 F (36.6 C) 97.8 F (36.6 C) 97.8 F (36.6 C) 97.8 F (36.6 C)  TempSrc: Oral   Oral  SpO2: 99% 100% 100% 98%  Weight:   73.5 kg   Height:        Intake/Output Summary (Last 24 hours) at 07/09/2023 1215 Last data filed at 07/09/2023 1610 Gross per 24 hour  Intake 600 ml  Output 1100 ml  Net -500 ml   Filed Weights   07/08/23 1446 07/09/23 0543  Weight: 72.5 kg 73.5 kg     Physical examination:   Constitution:  Alert, cooperative, no distress,  Appears calm and comfortable  Psychiatric:   Normal and stable mood and affect, cognition intact,   HEENT:        Normocephalic, PERRL, otherwise with in Normal limits  Chest:         Chest symmetric Cardio vascular:  S1/S2, RRR, No murmure, No Rubs or Gallops  pulmonary: Diffuse rhonchi, wheezing, mild bilateral lower lobe crackles, positive breath sounds diffusely  abdomen: Soft, non-tender, non-distended, bowel sounds,no masses, no organomegaly Muscular skeletal: Limited exam - in bed, able to move all 4 extremities,   Neuro: CNII-XII intact. , normal motor and sensation, reflexes intact   Extremities: No pitting edema lower extremities, +2 pulses  Skin: Dry, warm to touch, negative for any Rashes, No open wounds Wounds: per nursing documentation   ------------------------------------------------------------------------------------------------------------------------------------------    LABs:     Latest Ref Rng & Units 07/09/2023    4:44 AM 07/08/2023    3:36 PM 06/18/2023    1:53 PM  CBC  WBC 4.0 - 10.5 K/uL 7.9  8.3  11.7   Hemoglobin 13.0 - 17.0 g/dL 9.9  96.0  45.4   Hematocrit 39.0 - 52.0 % 31.0  32.1  38.5   Platelets 150 - 400 K/uL 344  363  276       Latest Ref Rng & Units 07/09/2023    4:44 AM 07/08/2023    3:36 PM 06/18/2023    1:53 PM  CMP  Glucose 70 - 99 mg/dL 78  86  161   BUN 8 - 23 mg/dL 11  11  16    Creatinine 0.61 - 1.24 mg/dL 0.96  0.45  4.09   Sodium 135 - 145 mmol/L 126  125  137   Potassium 3.5 - 5.1 mmol/L 4.5  5.1  5.5   Chloride 98 - 111 mmol/L 85  85  90   CO2 22 - 32 mmol/L 34  33  33   Calcium  8.9 - 10.3 mg/dL 8.5  8.9  9.4   Total Protein 6.5 - 8.1 g/dL   6.3   Total Bilirubin 0.0 - 1.2 mg/dL   0.4   Alkaline Phos 38 - 126 U/L   132   AST 15 - 41 U/L   43   ALT 0 - 44 U/L   46        Micro Results No results found for this or any previous visit (from the past 240 hours).  Radiology Reports DG Chest 2 View Result Date: 07/08/2023 CLINICAL DATA:  Shortness of breath EXAM: CHEST - 2 VIEW COMPARISON:  June 08, 2023, 4:59 a.m. FINDINGS: Persistent bilateral pulmonary infiltrates with basilar atelectasis and bilateral pleural effusions findings that could correlate with pneumonia and superimposed congestive changes. Heart and mediastinum upper limits of normal with a left ventricular configuration. IMPRESSION: Persistent pulmonary infiltrates and atelectasis Electronically Signed   By: Fredrich Jefferson M.D.   On: 07/08/2023 15:37    SIGNED: Bobbetta Burnet, MD, FHM. FAAFP. Curtis Flores - [facility] Time spent - 55 min.  In  seeing, evaluating and examining the patient. Reviewing medical records, labs, drawn plan of care. [facility],  Pager (please use [email or web address] to page/ text) Please use Epic Secure Chat for non-urgent communication (7AM-7PM)  If 7PM-7AM, please contact night-coverage [email or web address], 07/09/2023, 12:15 PM

## 2023-07-09 NOTE — TOC Initial Note (Addendum)
 Transition of Care [facility]) - Initial/Assessment Note    Patient Details  Name: Curtis Flores MRN: [id number] Date of Birth: 07-07-1955  [facility]) CM/SW Contact:    Ander Katos, LCSW Phone Number: 07/09/2023, 8:07 AM  Clinical Narrative:  Pt admitted for acute CHF. Assessment completed due to high risk readmission score. Pt lives with his wife. TOC also received consult for CHF screening. Pt reports he weighs himself several times a week and takes medications as prescribed. When asked about following a heart healthy diet, pt responded he does "as much as I can." Discussed HHRN for CHF education and pt agreeable. No preference on agency. Referred and accepted by Bridget[facility] with [facility] for HHRN/PT/OT. Will need orders. TOC will follow.                 Update: PT recommending rollator. Discussed with pt who reports he will get on his own.   Expected Discharge Plan: Home w Home Health Services Barriers to Discharge: Continued Medical Work up   Patient Goals and CMS Choice Patient states their goals for this hospitalization and ongoing recovery are:: return home   Choice offered to / list presented to : Patient [place] ownership interest in [facility].provided to::  (n/a)    Expected Discharge Plan and Services In-house Referral: Clinical Social Work   Post Acute Care Choice: Home Health Living arrangements for the past 2 months: Single Family Home                           HH Arranged: RN, PT HH Agency: [facility] Date [facility] Agency Contacted: 07/09/23 Time HH Agency Contacted: 0740 Representative spoke with [facility] Agency: [facility]  Prior Living Arrangements/Services Living arrangements for the past 2 months: Single Family Home Lives with:: Spouse Patient language and need for interpreter reviewed:: Yes Do you feel safe going back to the place where you live?: Yes        Care giver support system in place?: Yes (comment)    Criminal Activity/Legal Involvement Pertinent to Current Situation/Hospitalization: No - Comment as needed  Activities of Daily Living   ADL Screening (condition at time of admission) Independently performs ADLs?: No Does the patient have a NEW difficulty with bathing/dressing/toileting/self-feeding that is expected to last >3 days?: Yes (Initiates electronic notice to provider for possible OT consult) Does the patient have a NEW difficulty with getting in/out of bed, walking, or climbing stairs that is expected to last >3 days?: Yes (Initiates electronic notice to provider for possible PT consult) Does the patient have a NEW difficulty with communication that is expected to last >3 days?: No Is the patient deaf or have difficulty hearing?: No Does the patient have difficulty seeing, even when wearing glasses/contacts?: No Does the patient have difficulty concentrating, remembering, or making decisions?: No  Permission Sought/Granted                  Emotional Assessment     Affect (typically observed): Appropriate Orientation: : Oriented to Self, Oriented to Place, Oriented to  Time, Oriented to Situation Alcohol / Substance Use: Not Applicable Psych Involvement: No (comment)  Admission diagnosis:  Acute on chronic heart failure, unspecified heart failure type (HCC) [I50.9] Acute systolic (congestive) heart failure (HCC) [I50.21] Patient Active Problem List   Diagnosis Date Noted   Acute systolic (congestive) heart failure (HCC) 07/08/2023   Decubitus skin ulcer 07/08/2023   Adenocarcinoma (HCC)  06/13/2023   Bilateral pleural effusion 06/07/2023   Hyponatremia 06/07/2023   Transaminitis 06/07/2023   Elevated brain natriuretic peptide (BNP) level 06/07/2023   Metastatic adenocarcinoma to bone (HCC) 06/07/2023   HCAP (healthcare-associated pneumonia) 06/06/2023   Chest pain 01/13/2023   Immunocompromised patient (HCC) 04/22/2021   Cataract 03/23/2021   Immunization  counseling 03/09/2021   Medication monitoring encounter 12/01/2020   TB lung, 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/09/2020   Atrial fibrillation with RVR (HCC) 05/28/2018   Chronic anticoagulation 05/28/2018   NSTEMI (non-ST elevated myocardial infarction) (HCC) 05/09/2018   Ischemic cardiomyopathy 12/04/2016   Leukocytosis 07/17/2016   Pain in joint involving multiple sites 04/25/2016   Anemia 04/16/2016   Coronary artery disease 04/16/2016   History of atrial fibrillation 04/16/2016   Psoriatic arthritis (HCC) 02/14/2016   High risk medication use 02/14/2016   NICM (nonischemic cardiomyopathy) (HCC) 08/29/2015   Essential hypertension 08/29/2015   Abnormal CT scan, stomach    Hiatal hernia    GERD (gastroesophageal reflux disease) 08/01/2015   Weight loss, unintentional 08/01/2015   Iron deficiency anemia 06/06/2015   Heme positive stool 06/06/2015   PAF (paroxysmal atrial fibrillation) (HCC) 05/30/2015   CAD S/P percutaneous coronary angioplasty 04/07/2015   Old lateral wall myocardial infarction 04/07/2015   Acute on chronic systolic congestive heart failure (HCC) 04/07/2015   LBBB (left bundle branch block) 04/07/2015   Wide-complex tachycardia 04/07/2015   Atrial flutter (HCC) 04/07/2015   Mixed hyperlipidemia 03/07/2015   Psoriasis    Rheumatoid arthritis (HCC)    Pulmonary fibrosis (HCC) 03/21/2011   PCP:  [name], PA-C Pharmacy:   [facility] [phone] - [place], [place] - [address] [place] [address] Phone: [phone] Fax: [phone]  [facility] - [place], [place] - [address] [place] [address] Phone: [phone] Fax: [phone]  [facility] [address], [place] - [address] [place] [address] Phone: [phone] Fax: [phone]     Social Drivers of Health (SDOH) Social History: SDOH Screenings    Food Insecurity: No Food Insecurity (07/08/2023)  Housing: Low Risk  (07/08/2023)  Transportation Needs: No Transportation Needs (07/08/2023)  Utilities: Not At Risk (07/08/2023)  Depression (PHQ2-9): Low Risk  (03/09/2021)  Financial Resource Strain: Low Risk  (04/29/2023)   Received from [facility]  Physical Activity: Unknown (04/29/2023)   Received from [facility]  Social Connections: Moderately Integrated (07/08/2023)  Stress: No Stress Concern Present (04/29/2023)   [facility] from [facility]  Tobacco Use: Medium Risk (07/08/2023)   SDOH Interventions:     Readmission Risk Interventions    07/09/2023    8:05 AM 07/08/2023    9:44 PM 06/09/2023    4:41 PM  Readmission Risk Prevention Plan  Transportation Screening Complete Complete Complete  PCP or Specialist Appt within 5-7 Days  Complete   PCP or Specialist Appt within 3-5 Days   Complete  Home Care Screening  Complete   Medication Review (RN CM)  Complete   HRI or Home Care Consult Complete    Social Work Consult for Recovery Care Planning/Counseling Complete  Complete  Palliative Care Screening Not Applicable  Not Applicable  Medication Review Oceanographer) Complete  Referral to Pharmacy

## 2023-07-09 NOTE — Hospital Course (Addendum)
 Curtis Flores is a 68 y.o. male with medical history significant of PAF, MI, pulmonary fibrosis, HLD, HTN, Prostate Ca. Adenocarcinoma of the lung.    Presenting w/ several day worsening SOB. No fever, CP, N/V. Associated w/ LE swelling. Taking home lasix  w/o benefit. EMS called to evaluate pt and found to be hypoxic on home O2 of 2L [name].    ED Course: Workup as below. 40mg  IV lasix  x1 given.      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 W  Pulmonary fibrosis, decompensated CHF in conjunction w/ recent CAP and possible pulmonary adenocarcinoma.  -Stable this morning, satting 98% on 2 L oxygen -Baseline 2L [place]. Improved. >>>  Was up to 4 L of oxygen   -Continue DuoNeb bronchodilators, mucolytics,  - Acute decompensated CHF :  - Lasix   IV 80 mg twice a day-diuresed well, switching to p.o. torsemide 40 mg daily -Nebs as needed, mucolytics   - Continuing IV Solu-Medrol  - switching  to p.o. prednisone  with taper   Acute on Chronic combined systolic and diastolic CHF.  -Continue to have shortness of breath, +2 pitting edema -Improving shortness of breath and edema - On Lasix  80 mg IV twice daily-diuresing well >>> switching to torsemide 40 mg daily - Monitoring I's and O's and daily weight  Echo from 03/2022 showing EF 35% and Grade 1 diastolic dysfunction.  Baseline Lasix  40mg  PO BID  - Strict I/O   -Cardiology following  - DC sotalol , due to ADHF.  Not a candidate for amiodarone  due to pulmonary fibrosis, not a candidate for flecainide due to CAD, - Holding ACE, ARB's due to aggressive diuresis on  Lasix  for now   Intake/Output Summary (Last 24 hours) at 07/11/2023 1158 Last data filed at 07/11/2023 1106 Gross per 24 hour  Intake 240 ml  Output 2750 ml  Net -2510 ml   Continue IV Lasix  80 mg twice daily,  start Entresto 24-26 mg twice daily.     Limited echocardiogram scheduled for today-07/12/2023   Decubitus ulcers: Chronic.  - Wound care.    Prostate Ca / Adenocarcinoma Lung: known metastatic prostate cancer. 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  - No active mgt - defer to ongoing Oncology workup -Has a follow-up appointment next week with Dr. Cheree Cords   Hyponatremia/hypochloremia /hypoalbuminemia/hypocalcemia - Nephrology curb sided -Replace albumin , -  discontinuing supplement sodium tablets - Urine sodium 125, 126 >>> 128 >> 133  - Replete electrolytes accordingly  HTN: - hold home Losartan  and Norvasc  as diuresing    PAF/CAD-A-fib with RVR overnight  h/o cath/stent placement - Discontinue sotalol , continue Eliquis  + Plavix -per cardiology -Initiating metoprolol  for rate control - responding well   Anemia: Normocytic. Chronic disease.  With iron deficiency POA hgb 10.3 on admission. Baseline 11.3.  -Iron level 30, TIBC 155, ferritin 512 - Monitor for now. Likely worse due to chronic illness w/ acute worsening recently.  - Initiating iron supplement

## 2023-07-09 NOTE — Evaluation (Signed)
 Physical Therapy Evaluation Patient Details Name: Curtis Flores MRN: [id number] DOB: Mar 01, 1956 Today's Date: 07/09/2023  History of Present Illness  Curtis Flores is a 68 y.o. male with medical history significant of PAF, MI, pulmonary fibrosis, HLD, HTN, Prostate Ca. Adenocarcinoma of the lung.      Presenting w/ several day worsening SOB. No fever, CP, N/V. Associated w/ LE swelling. Taking home lasix  w/o benefit. EMS called to evaluate pt and found to be hypoxic on home O2 of 2L .   Clinical Impression  Patient functioning near baseline for functional mobility and gait other than requiring use of RW for safety during gait training after fatiguing. Patient required HOB partially raised for bed mobility due to baseline poor tolerance for lying flat, able to ambulate short distances safely without AD, but safer using RW when ambulating longer distances in hallway. Patient tolerated sitting up in chair after therapy. Patient will benefit from continued skilled physical therapy in hospital and recommended venue below to increase strength, balance, endurance for safe ADLs and gait.          If plan is discharge home, recommend the following: A little help with walking and/or transfers;A little help with bathing/dressing/bathroom;Help with stairs or ramp for entrance;Assistance with cooking/housework;Assist for transportation   Can travel by private vehicle        Equipment Recommendations Rollator (4 wheels)  Recommendations for Other Services       Functional Status Assessment Patient has had a recent decline in their functional status and demonstrates the ability to make significant improvements in function in a reasonable and predictable amount of time.     Precautions / Restrictions Precautions Precautions: Fall Recall of Precautions/Restrictions: Intact Restrictions Weight Bearing Restrictions Per Provider Order: No      Mobility  Bed Mobility Overal bed mobility: Needs  Assistance Bed Mobility: Supine to Sit, Sit to Supine     Supine to sit: HOB elevated, Supervision, Contact guard Sit to supine: HOB elevated, Contact guard assist, Supervision   General bed mobility comments: increased time, labored movement with moving legs onto bed    Transfers Overall transfer level: Needs assistance   Transfers: Sit to/from Stand, Bed to chair/wheelchair/BSC Sit to Stand: Supervision, Modified independent (Device/Increase time)   Step pivot transfers: Supervision, Modified independent (Device/Increase time)       General transfer comment: labored movement without AD without loss of balance    Ambulation/Gait Ambulation/Gait assistance: Supervision, Contact guard assist Gait Distance (Feet): 75 Feet Assistive device: Rolling walker (2 wheels), None Gait Pattern/deviations: Decreased step length - right, Decreased step length - left, Decreased stride length Gait velocity: decreased     General Gait Details: slightly labored movement with fair/good return walking in hallway without AD, once fatigued required use of RW for returning to room due to c/o fatigue and mild SOB with SpO2 dropping from 93% to 85% on room air  Stairs            Wheelchair Mobility     Tilt Bed    Modified Rankin (Stroke Patients Only)       Balance Overall balance assessment: Needs assistance Sitting-balance support: Feet supported, No upper extremity supported Sitting balance-Leahy Scale: Good Sitting balance - Comments: seated at EOB   Standing balance support: No upper extremity supported, During functional activity Standing balance-Leahy Scale: Fair Standing balance comment: fair to good without AD, good using RW  Pertinent Vitals/Pain Pain Assessment Pain Assessment: No/denies pain    Home Living Family/patient expects to be discharged to:: Private residence Living Arrangements: Spouse/significant other Available  Help at Discharge: Family;Available 24 hours/day Type of Home: House Home Access: Level entry       Home Layout: One level Home Equipment: Cane - single point      Prior Function Prior Level of Function : Needs assist       Physical Assist : ADLs (physical);Mobility (physical) Mobility (physical): Bed mobility;Transfers;Gait;Stairs ADLs (physical): IADLs Mobility Comments: independent ADLs Comments: Pt reports increased difficulty over the past 1 to 2 months. Prior to this the pt was independent with everything.     Extremity/Trunk Assessment   Upper Extremity Assessment Upper Extremity Assessment: Defer to OT evaluation    Lower Extremity Assessment Lower Extremity Assessment: Generalized weakness    Cervical / Trunk Assessment Cervical / Trunk Assessment: Normal  Communication   Communication Communication: No apparent difficulties Factors Affecting Communication: Other (comment) (decreased eye sight)    Cognition Arousal: Alert Behavior During Therapy: WFL for tasks assessed/performed   PT - Cognitive impairments: No apparent impairments                         Following commands: Intact       Cueing Cueing Techniques: Verbal cues     General Comments      Exercises     Assessment/Plan    PT Assessment Patient needs continued PT services  PT Problem List Decreased strength;Decreased activity tolerance;Decreased balance;Decreased mobility       PT Treatment Interventions DME instruction;Gait training;Stair training;Functional mobility training;Therapeutic activities;Therapeutic exercise;Balance training;Patient/family education    PT Goals (Current goals can be found in the Care Plan section)  Acute Rehab PT Goals Patient Stated Goal: return home with family to assist PT Goal Formulation: With patient Time For Goal Achievement: 07/12/23 Potential to Achieve Goals: Good    Frequency Min 3X/week     Co-evaluation PT/OT/SLP  Co-Evaluation/Treatment: Yes Reason for Co-Treatment: To address functional/ADL transfers PT goals addressed during session: Mobility/safety with mobility;Balance;Proper use of DME OT goals addressed during session: ADL's and self-care       AM-PAC PT "6 Clicks" Mobility  Outcome Measure Help needed turning from your back to your side while in a flat bed without using bedrails?: None Help needed moving from lying on your back to sitting on the side of a flat bed without using bedrails?: A Little Help needed moving to and from a bed to a chair (including a wheelchair)?: None Help needed standing up from a chair using your arms (e.g., wheelchair or bedside chair)?: None Help needed to walk in hospital room?: A Little Help needed climbing 3-5 steps with a railing? : A Little 6 Click Score: 21    End of Session Equipment Utilized During Treatment: Oxygen Activity Tolerance: Patient tolerated treatment well;Patient limited by fatigue Patient left: in chair;with call bell/phone within reach Nurse Communication: Mobility status PT Visit Diagnosis: Unsteadiness on feet (R26.81);Other abnormalities of gait and mobility (R26.89);Muscle weakness (generalized) (M62.81)    Time: 9528-4132 PT Time Calculation (min) (ACUTE ONLY): 22 min   Charges:   PT Evaluation $PT Eval Moderate Complexity: 1 Mod PT Treatments $Therapeutic Activity: 8-22 mins PT General Charges $$ ACUTE PT VISIT: 1 Visit         2:47 PM, 07/09/23 Walton Guppy, MPT Physical Therapist with [facility] [phone] office [phone] mobile phone

## 2023-07-09 NOTE — Plan of Care (Signed)
  Problem: Acute Rehab PT Goals(only PT should resolve) Goal: Pt Will Go Supine/Side To Sit Outcome: Progressing Flowsheets (Taken 07/09/2023 1448) Pt will go Supine/Side to Sit: with modified independence Goal: Patient Will Transfer Sit To/From Stand Outcome: Progressing Flowsheets (Taken 07/09/2023 1448) Patient will transfer sit to/from stand: with modified independence Goal: Pt Will Transfer Bed To Chair/Chair To Bed Outcome: Progressing Flowsheets (Taken 07/09/2023 1448) Pt will Transfer Bed to Chair/Chair to Bed: with modified independence Goal: Pt Will Ambulate Outcome: Progressing Flowsheets (Taken 07/09/2023 1448) Pt will Ambulate:  100 feet  with modified independence  with rolling walker  with least restrictive assistive device   2:49 PM, 07/09/23 Walton Guppy, MPT Physical Therapist with [facility] [phone] office [phone] mobile phone

## 2023-07-09 NOTE — Consult Note (Signed)
 Cardiology Consultation   Patient ID: Curtis Flores MRN: [id number]; DOB: [birth year]  Admit date: 07/08/2023 Date of Consult: 07/09/2023  PCP:  [name], PA-C   [facility] Providers Cardiologist:  [name], MD   {  Patient Profile:   Curtis Flores is a 68 y.o. male with a hx of CAD (s/p 2016 DES with T stenting to 1st Mrg & LCX, 2020/Last cath angioplasty alone for in-stent restenosis to Cx while previously stented Mrg at 85% could not be crossed with balloon to perform angioplasty, LAD 70%, RCA 70%),  paroxysmal afib/A flutter (s/p successful Ablation 05/2015), HFrEF (EF 35-40% in 01/2023 & 04/2022), HTN, HLD, rheumatic arthritis, Prostate Cancer, Adenocarcinoma of Left Lung, tobacco use who is being seen 07/09/2023 for the evaluation of acute HF at the request of [name].  History of Present Illness:   [name] was last seen in heart care 04/2023 with Dr. [name].  At that time, he was stable.  No med changes. Continued on Lipitor  80 mg, Norvasc  5 mg, Eliquis  5 mg twice daily, Plavix  75 mg, Lasix  40 mg twice daily, Losartan  100 mg, sotalol  120 twice daily, and KCl 20 MEQ twice daily.  Hospitalized in 06/2022 with PNA and found to have Adenocarcinoma of the Left on CT chest and thoracentesis. Also, CTAP with diffuse sclerotic bony metastasis. Followed up with Hematology Dr. Cheree Cords who recommended PET. PET revealed extensive metastatic disease, numerous abnormal lymph nodes, metastatic foci located in L adrenal gland and R hepatic lobe of liver, and loculated pleural effusion located more in left lower lobe than right.   Presented to ED 5/6 for worsening SOB associated with LE edema. CMP unremarkable except NA 125, Cl 85, CO2 33, Cr 0.58. MG 1.9. BNP 324.  CBC showed chronic anemia with worsening Hgb 10.3 > 9.9 (baseline 11.3). CXR showed persistent pulmonary infiltrates and atelectasis. EKG afib/flutter, HR 93, chronic LBBB. ED treated with IV Lasix  40 mg x 1.  On  interview, patient is standing at bedside sink with staff finishing daily care and transitioned to bedside chair without any obvious signs of distress.  Reported being dx and treated for PNA in 06/2023 and has finished abx. Approximately 2 weeks  ago, he noticed SOB with minimal exertion that has gradually worsened over time, sometimes relieved with rest. Endorse orthopnea x 1 year but has worsen as well.  Noted sleeping sitting upright on couch with feet propped up. Compliant with meds. Reported gaining approx 5 lbs over the past week. Reported edema x 2 weeks, sometime relieved by leg elevation. Productive clear cough x 1 m/o. Poor urine output prior to hospitalization with po lasix . Noted home O2 at 2L, which he is currently on now. Denies CP, syncope, palpitations. Previous tobacco user x 20 years but quit approx 20 years. Denies ETOH and drug use. Reported improvement in SOB, edema and urine output since being treated with IV lasix .    Past Medical History:  Diagnosis Date   Abnormal CT scan, stomach    Chronic systolic dysfunction of left ventricle    Coronary artery disease    lateral STEMI 02/22/15 3/10 cutting balloon to ISR of the o/pLCX   GERD (gastroesophageal reflux disease)    Hyperlipidemia    Hypertension    Ischemic cardiomyopathy    LBBB (left bundle branch block)    Leukocytosis    followed by hematology, reactive   Lymphadenopathy    Myocardial infarction (HCC) 02/2015   Psoriasis 2003  Psoriatic arthritis (HCC)    Pulmonary fibrosis (HCC)    Rheumatoid arthritis(714.0) 2012   Typical atrial flutter [facility])     Past Surgical History:  Procedure Laterality Date   CARDIAC CATHETERIZATION N/A 02/22/2015   Procedure: Left Heart Cath and Coronary Angiography;  Surgeon: Avanell Leigh, MD;  Location: [facility] INVASIVE CV LAB;  Service: Cardiovascular;  Laterality: N/A;   CARDIAC CATHETERIZATION N/A 02/22/2015   Procedure: Coronary Stent Intervention;  Surgeon: Avanell Leigh,  MD;  Location: [facility] INVASIVE CV LAB;  Service: Cardiovascular;  Laterality: N/A;   CARDIOVERSION N/A 04/12/2015   Procedure: CARDIOVERSION;  Surgeon: Darlis Eisenmenger, MD;  Location: [facility] ENDOSCOPY;  Service: Cardiovascular;  Laterality: N/A;   COLONOSCOPY  07/01/2003   WJX:BJYNWG colonic mucosa except for the proximal right colon in the area of ICV which was not seen completely due to inadequate bowel prep. followed with ACBE which was normal.    COLONOSCOPY N/A 08/24/2015   Dr. Riley Cheadle: Normal colon. Next colonoscopy in 10 years.   CORONARY BALLOON ANGIOPLASTY N/A 05/12/2018   Procedure: CORONARY BALLOON ANGIOPLASTY;  Surgeon: Arty Binning, MD;  Location: [facility] INVASIVE CV LAB;  Service: Cardiovascular;  Laterality: N/A;   ELECTROPHYSIOLOGIC STUDY N/A 05/30/2015   Atrial fibrillation ablation by Dr Nunzio Belch   ESOPHAGOGASTRODUODENOSCOPY N/A 08/24/2015   Dr. Riley Cheadle: Medium-sized hiatal hernia, erosive gastropathy. Cameron lesions. Esophageal mucosa distally suggestive of short segment Barrett's esophagus. Not confirmed on biopsy. Gastric biopsy with minimal chronic inflammation   GIVENS CAPSULE STUDY N/A 04/17/2016   Procedure: GIVENS CAPSULE STUDY;  Surgeon: Suzette Espy, MD;  Location: [facility] ENDO SUITE;  Service: Endoscopy;  Laterality: N/A;  Pt to arrive at 8:00 am for 8:30 am appt   LEFT HEART CATH AND CORONARY ANGIOGRAPHY N/A 05/11/2018   Procedure: LEFT HEART CATH AND CORONARY ANGIOGRAPHY;  Surgeon: Arty Binning, MD;  Location: [facility] INVASIVE CV LAB;  Service: Cardiovascular;  Laterality: N/A;   TEE WITHOUT CARDIOVERSION N/A 04/12/2015   Procedure: TRANSESOPHAGEAL ECHOCARDIOGRAM (TEE);  Surgeon: Darlis Eisenmenger, MD;  Location: [facility] ENDOSCOPY;  Service: Cardiovascular;  Laterality: N/A;     Home Medications:  Prior to Admission medications   Medication Sig Start Date End Date Taking? Authorizing Provider  amLODipine  (NORVASC ) 5 MG tablet Take 1 tablet (5 mg total) by mouth daily. 04/24/23  Yes Avanell Leigh, MD   apixaban  (ELIQUIS ) 5 MG TABS tablet TAKE 1 TABLET TWICE DAILY 05/15/23  Yes Avanell Leigh, MD  atorvastatin  (LIPITOR ) 80 MG tablet TAKE 1 TABLET EVERY DAY AT 6 PM 07/08/23  Yes Avanell Leigh, MD  azaTHIOprine  (IMURAN ) 50 MG tablet TAKE 3 TABLETS EVERY DAY 03/31/23  Yes Dale, Taylor M, PA-C  clobetasol  (TEMOVATE ) 0.05 % external solution Apply 1 Application topically 2 (two) times daily. Patient taking differently: Apply 1 Application topically 2 (two) times daily as needed (Dermatitis). 01/24/23  Yes Romayne Clubs, PA-C  Clobetasol  Prop Emollient Base (CLOBETASOL  PROPIONATE E) 0.05 % emollient cream APPLY TO AFFECTED AREA TWICE A DAY AS NEEDED 04/04/23  Yes Romayne Clubs, PA-C  clopidogrel  (PLAVIX ) 75 MG tablet Take 1 tablet (75 mg total) by mouth daily. 04/24/23  Yes Avanell Leigh, MD  docusate sodium  (COLACE) 100 MG capsule Take 1 capsule (100 mg total) by mouth daily. 06/13/23  Yes Pokhrel, Laxman, MD  feeding supplement (ENSURE ENLIVE / ENSURE PLUS) LIQD Take 237 mLs by mouth 2 (two) times daily between meals. 06/13/23  Yes Pokhrel, Amador Bad, MD  furosemide  (LASIX ) 40 MG tablet TAKE 1 TABLET TWICE DAILY 06/30/23  Yes Avanell Leigh, MD  losartan  (COZAAR ) 100 MG tablet TAKE 1 TABLET EVERY DAY 05/21/22  Yes Avanell Leigh, MD  pantoprazole  (PROTONIX ) 40 MG tablet TAKE 1 TABLET TWICE DAILY 11/02/20  Yes Delman Ferns, NP  potassium chloride  SA (KLOR-CON  M) 20 MEQ tablet TAKE 2 TABLETS TWICE DAILY 10/31/22  Yes Avanell Leigh, MD  predniSONE  (DELTASONE ) 5 MG tablet Take 1 tablet (5 mg total) by mouth daily with breakfast. Patient taking differently: Take 20 mg by mouth daily with breakfast. 06/22/23  Yes Pokhrel, Laxman, MD  sotalol  (BETAPACE ) 120 MG tablet TAKE 1 TABLET EVERY 12 HOURS 10/31/22  Yes Avanell Leigh, MD    Inpatient Medications: Scheduled Meds:  apixaban   5 mg Oral BID   atorvastatin   80 mg Oral q1800   azaTHIOprine   50 mg Oral Daily   clopidogrel   75 mg Oral Daily    feeding supplement  237 mL Oral BID BM   furosemide   40 mg Intravenous BID   [START ON 07/10/2023] pantoprazole   40 mg Oral Daily   potassium chloride  SA  40 mEq Oral BID   predniSONE   20 mg Oral Q breakfast   Continuous Infusions:  albumin human     magnesium  sulfate bolus IVPB     PRN Meds:   Allergies:   No Known Allergies  Social History:   Social History   Socioeconomic History   Marital status: Married    Spouse name: Not on file   Number of children: 2   Years of education: Not on file   Highest education level: Not on file  Occupational History   Occupation: unemployed    Comment: not working do to arthritis; used to be Production designer, 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   Drug use: No    Comment: H/O marijuana use many years ago.   Sexual activity: Yes    Birth control/protection: None  Other Topics Concern   Not on file  Social History Narrative   Not on file   Social Drivers of Health   Financial Resource Strain: Low Risk  (04/29/2023)   Received from [facility]   Overall Financial Resource Strain (CARDIA)    Difficulty of Paying Living Expenses: Not very hard  Food Insecurity: No Food Insecurity (07/08/2023)   Hunger Vital Sign    Worried About Running Out of Food in the Last Year: Never true    Ran Out of Food in the Last Year: Never true  Transportation Needs: No Transportation Needs (07/08/2023)   PRAPARE - Administrator, Civil Service (Medical): No    Lack of  Transportation (Non-Medical): No  Physical Activity: Unknown (04/29/2023)   Received from [facility]   Exercise Vital Sign    Days of Exercise per Week: 0 days  Minutes of Exercise per Session: Not on file  Stress: No Stress Concern Present (04/29/2023)   Received from [facility] of Occupational Health - Occupational Stress Questionnaire    Feeling of Stress : Not at all  Social Connections: Moderately Integrated (07/08/2023)   Social Connection and Isolation Panel [NHANES]    Frequency of Communication with Friends and Family: Once a week    Frequency of Social Gatherings with Friends and Family: Once a week    Attends Religious Services: 1 to 4 times per year    Active Member of [employer] or Organizations: Yes    Attends Banker Meetings: Never    Marital Status: Married  Catering manager Violence: Not At Risk (07/08/2023)   Humiliation, Afraid, Rape, and Kick questionnaire    Fear of Current or Ex-Partner: No    Emotionally Abused: No    Physically Abused: No    Sexually Abused: No    Family History:   Family History  Problem Relation Age of Onset   Hypertension Mother    Colon cancer Neg Hx      ROS:  Please see the history of present illness.  All other ROS reviewed and negative.     Physical Exam/Data:   Vitals:   07/09/23 0214 07/09/23 0454 07/09/23 0543 07/09/23 0823  BP: (!) 123/49 (!) 119/46 (!) 122/47 (!) 120/55  Pulse: (!) 56 (!) 57 64 (!) 56  Resp: 16 18 17 16   Temp: 97.8 F (36.6 C) 97.8 F (36.6 C) 97.8 F (36.6 C) 97.8 F (36.6 C)  TempSrc: Oral   Oral  SpO2: 99% 100% 100% 98%  Weight:   73.5 kg   Height:        Intake/Output Summary (Last 24 hours) at 07/09/2023 1155 Last data filed at 07/09/2023 0931 Gross per 24 hour  Intake 600 ml  Output 1100 ml  Net -500 ml      07/09/2023    5:43 AM 07/08/2023    2:46 PM 06/18/2023    1:10 PM  Last 3 Weights  Weight (lbs) 162 lb 0.6 oz 159 lb 13.3 oz 159 lb 2.8 oz  Weight  (kg) 73.5 kg 72.5 kg 72.2 kg     Body mass index is 26.15 kg/m.  General:  Sitting upright in recliner wearing [place] 2 L O2, in no acute distress HEENT: normal Neck:  + JVD Vascular: No carotid bruits; Distal pulses 2+ bilaterally Cardiac:  normal S1, S2; RRR; no murmur  Lungs:  diminished in lower bases, no wheezing, rhonchi or rales  Abd: soft, nontender, no hepatomegaly  Ext: +2 pitting edema  Musculoskeletal:  No deformities, BUE and BLE strength normal and equal Skin: warm and dry  Neuro:  CNs 2-12 intact, no focal abnormalities noted Psych:  Normal affect   EKG:  The EKG was personally reviewed and demonstrates:  afib/flutter, HR 93, chronic LBBB Telemetry:  Telemetry was personally reviewed and demonstrates:  No data to review, ordered.   Relevant CV Studies: ECHO 01/14/2023 IMPRESSIONS   1. Left ventricular ejection fraction, by estimation, is 35 to 40%. The  left ventricle has moderately decreased function. The left ventricle  demonstrates global hypokinesis. Left ventricular diastolic parameters are  consistent with Grade I diastolic  dysfunction (impaired relaxation). Elevated left ventricular end-diastolic  pressure.   2. Right ventricular systolic function is normal. The right ventricular  size is normal.   3. The mitral valve is normal in structure. Trivial mitral valve  regurgitation. No evidence of mitral stenosis.   4. The aortic valve is normal in structure. Aortic valve regurgitation is  not visualized. Aortic valve sclerosis/calcification is present, without  any evidence of aortic stenosis. Aortic valve area, by VTI measures 2.63  cm. Aortic valve mean gradient  measures 3.0 mmHg. Aortic valve Vmax measures 1.24 m/s.   5. The inferior vena cava is normal in size with greater than 50%  respiratory variability, suggesting right atrial pressure of 3 mmHg.   Cardiac cath 05/2018 Diagnostic Dominance: Left Left Anterior Descending  Ost LAD to Prox LAD lesion  is 70% stenosed. The lesion is calcified.  Mid LAD lesion is 70% stenosed.    Left Circumflex  Ost Cx to Prox Cx lesion is 99% stenosed.  Prox Cx to Mid Cx lesion is 30% stenosed. The lesion was previously treated.    First Obtuse Marginal Branch  Ost 1st Mrg to 1st Mrg lesion is 85% stenosed. The lesion was previously treated.    Right Coronary Artery  Mid RCA lesion is 70% stenosed.  Mid RCA to Dist RCA lesion is 60% stenosed.    Intervention   Ost Cx to Prox Cx lesion  Angioplasty (Also treats lesions: Prox Cx to Mid Cx)  Post-Intervention Lesion Assessment  The intervention was successful.  There is a 40% residual stenosis post intervention.    Prox Cx to Mid Cx lesion  Angioplasty (Also treats lesions: Ost Cx to Prox Cx)  Post-Intervention Lesion Assessment  The intervention was successful.  There is a 30% residual stenosis post intervention.    Diagnostic Dominance: Left  Intervention    Successful scoring balloon angioplasty of in-stent restenosis involving the ostium to proximal circumflex reducing a 99% stenosis to 40% with TIMI grade III flow.  The ostial stenosis in the first obtuse marginal could not be crossed with the balloon to perform angioplasty.   RECOMMENDATIONS:   Clopidogrel  and apixaban  going forward.  No aspirin  should be used to avoid excess bleeding risk. Further management per treating team.  Discussed with Dr. [name]. Laboratory Data:  High Sensitivity Troponin:   Recent Labs  Lab 07/08/23 1536 07/08/23 1806  TROPONINIHS 10 9     Chemistry Recent Labs  Lab 07/08/23 1536 07/08/23 1806 07/09/23 0444  NA 125*  --  126*  K 5.1  --  4.5  CL 85*  --  85*  CO2 33*  --  34*  GLUCOSE 86  --  78  BUN 11  --  11  CREATININE 0.58*  --  0.60*  CALCIUM  8.9  --  8.5*  MG  --  1.9  --   GFRNONAA >60  --  >60  ANIONGAP 7  --  7    No results for input(s): "PROT", "ALBUMIN", "AST", "ALT", "ALKPHOS", "BILITOT" in the last 168 hours. Lipids  No results for input(s): "CHOL", "TRIG", "HDL", "LABVLDL", "LDLCALC", "CHOLHDL" in the last 168 hours.  Hematology Recent Labs  Lab 07/08/23 1536 07/09/23 0444  WBC 8.3 7.9  RBC 3.58* 3.44*  HGB 10.3* 9.9*  HCT 32.1* 31.0*  MCV 89.7 90.1  MCH 28.8 28.8  MCHC 32.1 31.9  RDW 16.4* 16.4*  PLT 363 344   Thyroid No results for input(s): "TSH", "FREET4" in the last 168 hours.  BNP Recent Labs  Lab 07/08/23 1536  BNP 324.0*    DDimer No results for input(s): "DDIMER" in the last 168 hours.   Radiology/Studies:  DG Chest 2 View Result Date: 07/08/2023 CLINICAL  DATA:  Shortness of breath EXAM: CHEST - 2 VIEW COMPARISON:  June 08, 2023, 4:59 a.m. FINDINGS: Persistent bilateral pulmonary infiltrates with basilar atelectasis and bilateral pleural effusions findings that could correlate with pneumonia and superimposed congestive changes. Heart and mediastinum upper limits of normal with a left ventricular configuration. IMPRESSION: Persistent pulmonary infiltrates and atelectasis Electronically Signed   By: Fredrich Jefferson M.D.   On: 07/08/2023 15:37     Assessment and Plan:   Acute on Chronic HFrEF Ischemic Cardiomyopathy  ECHO 01/2023: EF 35-40%, moderately decreased function in the LV, global hypokinesis, G1 DD, elevated LVEDP, trivial MVR, AV calcification without AS. Ordered Limited ECHO to evaluate EF and RV function. Reported SOB with minimal exertion and edema x 2 weeks. Reported 5 lb weight gain over the last week. Chronic Orthopnea. Noted improvement in SOB, edema and urine output since IV lasix . BNP 324. Treated with IV lasix  40 mg x1 . I/O -500, Cr 0.58., Wt 159 > 162. Wt was 164 in last OV 04/2023. Need to establish dry weight once diuresed.  Appears volume overloaded.  Continue IV lasix  40 mg BID.  Continue to monitor I/O, renal function and weight, Can consider standing weights  GDMT: Losartan  on hold. Once euvolemic and BP allows, can consider spironolactone and SGTL2i. Would  consider BB over CCB.  Adenomacarcinoma is also a contributing factor .    Hypomagnesium  K 4.5 this am. Continue Home K supplement  Mg 1.9 this am. Ordered supplemental Mg Goal K  > 4, MG > 2  HTN Currently holding home losartan  and Norvasc  as diuresis is affected and soft BP. BP this am 120/55 With CAD history and EF, would consider BB over CCB when restarting BP medications.    Paroxysmal afib/A flutter s/p successful ablation 05/2015  EKG showed afib/flutter, HR 90's. Ordered Cardiac telemetry.  Denied palpitations.  Continue Eliquis  5 mg BID. Eliquis  is the appropriate dose given age, weight and renal function.  Currently on Sotalol  120 mg BID. May need to be d/c based on ECHO EF and recurrent afib/flutter.  CAD  HLD  s/p 2016 DES with T stenting to 1st Mrg & LCX, 2020/Last cath angioplasty alone for in-stent restenosis to Cx while previously stented Mrg at 85% could not be crossed with balloon to perform angioplasty, LAD 70%, RCA 70% Denies any CP.  Currently on Plavix  75 mg. Previous note in 2020 noted continuing Plavix  and Eliquis  but no timeline mentioned. Can consider d/c since also on AC. Will discuss with MD. Continue Lipitor  80 mg. NO ASA since on AC. CCB on hold as above but will plan to switch to BB as tolerated.  LBBB Chronic since 2016 Will continue to monitor  Anemia  Baseline Hgb 11.3.  Currently Hgb 9.9 Continue to monitor. Managed by primary team  Adenocarcinoma of the Left  Prostate cancer  Metastatic METS Managed by outpatient hematology    Risk Assessment/Risk Scores:   [place]  Heart Association (NYHA) Functional Class NYHA Class III  CHA2DS2-VASc Score = 4  This indicates a 4.8% annual risk of stroke. The patient's score is based upon: CHF History: 1 HTN History: 1 Diabetes History: 0 Stroke History: 0 Vascular Disease History: 1 Age Score: 1 Gender Score: 0         For questions or updates, please contact [facility] Please consult [email or web address] for contact info under    Signed, Metta Actis, PA-C  07/09/2023 11:55 AM

## 2023-07-10 ENCOUNTER — Other Ambulatory Visit (HOSPITAL_COMMUNITY)

## 2023-07-10 DIAGNOSIS — C799 Secondary malignant neoplasm of unspecified site: Secondary | ICD-10-CM

## 2023-07-10 DIAGNOSIS — C3490 Malignant neoplasm of unspecified part of unspecified bronchus or lung: Secondary | ICD-10-CM

## 2023-07-10 DIAGNOSIS — I5021 Acute systolic (congestive) heart failure: Secondary | ICD-10-CM | POA: Diagnosis not present

## 2023-07-10 DIAGNOSIS — I5023 Acute on chronic systolic (congestive) heart failure: Secondary | ICD-10-CM | POA: Diagnosis not present

## 2023-07-10 DIAGNOSIS — Z7901 Long term (current) use of anticoagulants: Secondary | ICD-10-CM

## 2023-07-10 DIAGNOSIS — Z7902 Long term (current) use of antithrombotics/antiplatelets: Secondary | ICD-10-CM | POA: Diagnosis not present

## 2023-07-10 DIAGNOSIS — I2510 Atherosclerotic heart disease of native coronary artery without angina pectoris: Secondary | ICD-10-CM | POA: Diagnosis not present

## 2023-07-10 LAB — BASIC METABOLIC PANEL WITH GFR
Anion gap: 9 (ref 5–15)
BUN: 16 mg/dL (ref 8–23)
CO2: 32 mmol/L (ref 22–32)
Calcium: 8.7 mg/dL — ABNORMAL LOW (ref 8.9–10.3)
Chloride: 87 mmol/L — ABNORMAL LOW (ref 98–111)
Creatinine, Ser: 0.35 mg/dL — ABNORMAL LOW (ref 0.61–1.24)
GFR, Estimated: >60 mL/min (ref >60)
Glucose, Bld: 131 mg/dL — ABNORMAL HIGH (ref 70–99)
Potassium: 5.1 mmol/L (ref 3.5–5.1)
Sodium: 128 mmol/L — ABNORMAL LOW (ref 135–145)

## 2023-07-10 LAB — BRAIN NATRIURETIC PEPTIDE: B Natriuretic Peptide: 593 pg/mL — ABNORMAL HIGH (ref 0.0–100.0)

## 2023-07-10 LAB — IRON AND TIBC
Iron: 30 ug/dL — ABNORMAL LOW (ref 45–182)
Saturation Ratios: 19 % (ref 17.9–39.5)
TIBC: 155 ug/dL — ABNORMAL LOW (ref 250–450)
UIBC: 125 ug/dL

## 2023-07-10 LAB — FERRITIN: Ferritin: 512 ng/mL — ABNORMAL HIGH (ref 24–336)

## 2023-07-10 MED ORDER — METHYLPREDNISOLONE SODIUM SUCC 40 MG IJ SOLR
40.0000 mg | Freq: Two times a day (BID) | INTRAMUSCULAR | Status: DC
  Administered 2023-07-10 – 2023-07-12 (×4): 40 mg via INTRAVENOUS
  Filled 2023-07-10 (×4): qty 1

## 2023-07-10 MED ORDER — POTASSIUM CHLORIDE CRYS ER 20 MEQ PO TBCR
40.0000 meq | EXTENDED_RELEASE_TABLET | Freq: Every day | ORAL | Status: DC
Start: 2023-07-11 — End: 2023-07-11

## 2023-07-10 MED ORDER — LEVALBUTEROL HCL 1.25 MG/0.5ML IN NEBU
1.2500 mg | INHALATION_SOLUTION | Freq: Three times a day (TID) | RESPIRATORY_TRACT | Status: DC
  Administered 2023-07-11 – 2023-07-14 (×10): 1.25 mg via RESPIRATORY_TRACT
  Filled 2023-07-10 (×10): qty 0.5

## 2023-07-10 MED ORDER — IPRATROPIUM BROMIDE 0.02 % IN SOLN
0.5000 mg | Freq: Three times a day (TID) | RESPIRATORY_TRACT | Status: DC
  Administered 2023-07-11 – 2023-07-14 (×11): 0.5 mg via RESPIRATORY_TRACT
  Filled 2023-07-10 (×11): qty 2.5

## 2023-07-10 MED ORDER — ALBUMIN HUMAN 25 % IV SOLN: 25.0000 g | Freq: Four times a day (QID) | INTRAVENOUS | Status: DC

## 2023-07-10 MED ORDER — ALBUMIN HUMAN 25 % IV SOLN
25.0000 g | Freq: Four times a day (QID) | INTRAVENOUS | Status: AC
  Filled 2023-07-10: qty 100

## 2023-07-10 MED ORDER — ALBUMIN HUMAN 5 % IV SOLN
25.0000 g | Freq: Once | INTRAVENOUS | Status: AC
  Administered 2023-07-10: 25 g via INTRAVENOUS
  Filled 2023-07-10: qty 500

## 2023-07-10 MED ORDER — LEVALBUTEROL HCL 1.25 MG/0.5ML IN NEBU
1.2500 mg | INHALATION_SOLUTION | Freq: Four times a day (QID) | RESPIRATORY_TRACT | Status: DC | PRN
  Administered 2023-07-12: 1.25 mg via RESPIRATORY_TRACT
  Filled 2023-07-10: qty 0.5

## 2023-07-10 NOTE — Progress Notes (Signed)
 PROGRESS NOTE    Patient: Curtis Flores                            PCP: [name], PA-C                    DOB: [birth year]            DOA: 07/08/2023 [id number]             DOS: 07/10/2023, 11:31 AM   LOS: 2 days   Date of Service: The patient was seen and examined on 07/10/2023  Subjective:   The patient was seen and examined this morning, awake alert oriented, mildly tachypneic with respiratory rate of 25, stating he has had shortness of breath with wheezing, just got a breathing treatment Was up to 4 L of oxygen this morning satting 96-98% Diuresing well   Brief Narrative:   Curtis Flores is a 68 y.o. male with medical history significant of PAF, MI, pulmonary fibrosis, HLD, HTN, Prostate Ca. Adenocarcinoma of the lung.    Presenting w/ several day worsening SOB. No fever, CP, N/V. Associated w/ LE swelling. Taking home lasix  w/o benefit. EMS called to evaluate pt and found to be hypoxic on home O2 of 2L [place].    ED Course: Workup as below. 40mg  IV lasix  x1 given.      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 Likely from combination of Pulmonary fibrosis, decompensated CHF in conjunction w/ recent CAP and possible pulmonary adenocarcinoma.  -Baseline 2L [place]. Improved. >>>  Was up to 4 L of oxygen this morning, satting 96-98% -DuoNeb bronchodilators, mucolytics,  - Acute decompensated CHF : Continue IV Lasix  40 mg twice daily, monitoring I's and O's, daily weight   - continue Azathioprine , Prednisone  taper   Acute on Chronic combined systolic and diastolic CHF.  Continue to have shortness  of breath, +2-3 pitting edema bilateral lower extremities up to the thigh area Acute decompensation from ongoing illness and poor overal cardiac function.   Echo from 03/2022 showing EF 35% and Grade 1 diastolic dysfunction.  Baseline Lasix  40mg  PO BID - Strict I/O - 07/09/2023 IV lasix  40mg  BID>> per cardiology increased to 80 twice daily   -Consulting cardiology appreciate further evaluation recommendations - DC sotalol , due to ADHF.  Not a candidate for amiodarone  due to pulmonary fibrosis, not a candidate for flecainide due to CAD, - Holding ACE, ARB's due to aggressive diuresis on Lasix  for now   Intake/Output Summary (Last 24 hours) at 07/10/2023 1124 Last data filed at 07/10/2023 1055 Gross per 24 hour  Intake 1325.78 ml  Output 3375 ml  Net -2049.22 ml      Decubitus ulcers: Chronic.  - Wound care.    Prostate Ca / Adenocarcinoma Lung: known metastatic prostate cancer. 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  - No active mgt - defer to ongoing Oncology workup -Has a follow-up appointment next week with Dr. Cheree Cords   Hyponatremia/hypochloremia:  - Nephrology curb sided - Recommend replacing albumin, continue Lasix , and discontinuing supplement  sodium tablets - Urine sodium 125, 126 >>> 128   HTN: - hold home Losartan  and Norvasc  as diuresing    PAF/CAD: currently in Afib. H/o cath/stent placement - continue sotalol , Eliquis , (possible discontinue home medication Plavix )   Anemia: Normocytic. Chronic disease.  POA hgb 10.3 on admission. Baseline 11.3.  - Monitor for now. Likely worse due to chronic illness w/ acute worsening recently.  -    ----------------------------------------------------------------------------------------------------------------------------------------------- Nutritional status:  The patient's BMI is: Body mass index is 26.9 kg/m. I agree with the assessment and plan as outlined   DVT prophylaxis:  apixaban   (ELIQUIS ) tablet 5 mg   Code Status:   Code Status: Full Code  Family Communication: -Advance care planning has been discussed.   Admission status:   Status is: Inpatient Remains inpatient appropriate because: Needing IV diuretics, respiratory support   Disposition: From  - home             Planning for discharge in 1-2 days: to   Procedures:   No admission procedures for hospital encounter.   Antimicrobials:  Anti-infectives (From admission, onward)    None        Medication:   apixaban   5 mg Oral BID   atorvastatin   80 mg Oral q1800   azaTHIOprine   50 mg Oral Daily   [START ON 07/11/2023] clopidogrel   75 mg Oral Daily   feeding supplement  237 mL Oral BID BM   furosemide   80 mg Intravenous BID   ipratropium  0.5 mg Nebulization Q6H   levalbuterol  1.25 mg Nebulization Q6H   liver oil-zinc  oxide   Topical TID   pantoprazole   40 mg Oral Daily   potassium chloride  SA  40 mEq Oral BID   predniSONE   50 mg Oral Q breakfast    HYDROmorphone (DILAUDID) injection, oxyCODONE -acetaminophen    Objective:   Vitals:   07/10/23 0232 07/10/23 0445 07/10/23 0803 07/10/23 1000  BP:  (!) 154/75    Pulse:  64    Resp:    (!) 25  Temp:  97.8 F (36.6 C)    TempSrc:  Oral    SpO2: 97% 96% 96%   Weight:  75.6 kg    Height:        Intake/Output Summary (Last 24 hours) at 07/10/2023 1131 Last data filed at 07/10/2023 1055 Gross per 24 hour  Intake 1325.78 ml  Output 3375 ml  Net -2049.22 ml   Filed Weights   07/08/23 1446 07/09/23 0543 07/10/23 0445  Weight: 72.5 kg 73.5 kg 75.6 kg     Physical examination:        General:  AAO x 3,  cooperative, no distress;   HEENT:  Normocephalic, PERRL, otherwise with in Normal limits   Neuro:  CNII-XII intact. , normal motor and sensation, reflexes intact   Lungs:   Shortness of breath, on 4 L of oxygen Diffuse Rales, rhonchi, improved wheezing, mild bilateral lower lobe crackles  Cardio:    S1/S2, RRR, No murmure, No Rubs  or Gallops   Abdomen:  Soft, non-tender, bowel sounds active all four quadrants, no guarding or peritoneal signs.  Muscular  skeletal:  Limited exam -global generalized weaknesses - in bed, able to move all 4 extremities,   2+ pulses,  symmetric, No pitting edema  Skin:  Dry, warm to touch, negative for any Rashes,  Wounds: Please see nursing documentation         ------------------------------------------------------------------------------------------------------------------------------------------    LABs:     Latest Ref  Rng & Units 07/09/2023    4:44 AM 07/08/2023    3:36 PM 06/18/2023    1:53 PM  CBC  WBC 4.0 - 10.5 K/uL 7.9  8.3  11.7   Hemoglobin 13.0 - 17.0 g/dL 9.9  96.0  45.4   Hematocrit 39.0 - 52.0 % 31.0  32.1  38.5   Platelets 150 - 400 K/uL 344  363  276       Latest Ref Rng & Units 07/10/2023    4:37 AM 07/09/2023    6:42 PM 07/09/2023    4:44 AM  CMP  Glucose 70 - 99 mg/dL 098  119  78   BUN 8 - 23 mg/dL 16  19  11    Creatinine 0.61 - 1.24 mg/dL 1.47  8.29  5.62   Sodium 135 - 145 mmol/L 128  125  126   Potassium 3.5 - 5.1 mmol/L 5.1  4.8  4.5   Chloride 98 - 111 mmol/L 87  83  85   CO2 22 - 32 mmol/L 32  33  34   Calcium  8.9 - 10.3 mg/dL 8.7  8.5  8.5   Total Protein 6.5 - 8.1 g/dL  6.3    Total Bilirubin 0.0 - 1.2 mg/dL  0.5    Alkaline Phos 38 - 126 U/L  183    AST 15 - 41 U/L  47    ALT 0 - 44 U/L  45         Micro Results No results found for this or any previous visit (from the past 240 hours).  Radiology Reports No results found.   SIGNED: Bobbetta Burnet, MD, FHM. FAAFP. Arlin Benes - [facility] Time spent - 55 min.  In seeing, evaluating and examining the patient. Reviewing medical records, labs, drawn plan of care. [facility],  Pager (please use [email or web address] to page/ text) Please use Epic Secure Chat for non-urgent communication (7AM-7PM)  If 7PM-7AM, please contact night-coverage [email or web address], 07/10/2023, 11:31  AM

## 2023-07-10 NOTE — Plan of Care (Signed)

## 2023-07-10 NOTE — Plan of Care (Signed)

## 2023-07-10 NOTE — Progress Notes (Addendum)
 Progress Note  Patient Name: Curtis Flores Date of Encounter: 07/10/2023  Primary Cardiologist: [name], MD  Subjective   No acute events overnight.  Continues to have SOB but better compared to yesterday.  Inpatient Medications    Scheduled Meds:  apixaban   5 mg Oral BID   atorvastatin   80 mg Oral q1800   azaTHIOprine   50 mg Oral Daily   [START ON 07/11/2023] clopidogrel   75 mg Oral Daily   feeding supplement  237 mL Oral BID BM   furosemide   80 mg Intravenous BID   ipratropium  0.5 mg Nebulization Q6H   levalbuterol  1.25 mg Nebulization Q6H   liver oil-zinc  oxide   Topical TID   pantoprazole   40 mg Oral Daily   potassium chloride  SA  40 mEq Oral BID   predniSONE   50 mg Oral Q breakfast   Continuous Infusions:  albumin human     PRN Meds: HYDROmorphone (DILAUDID) injection, oxyCODONE -acetaminophen    Vital Signs    Vitals:   07/09/23 2047 07/10/23 0232 07/10/23 0445 07/10/23 0803  BP:   (!) 154/75   Pulse:   64   Resp:      Temp:   97.8 F (36.6 C)   TempSrc:   Oral   SpO2: 98% 97% 96% 96%  Weight:   75.6 kg   Height:        Intake/Output Summary (Last 24 hours) at 07/10/2023 1053 Last data filed at 07/10/2023 0924 Gross per 24 hour  Intake 1325.78 ml  Output 2675 ml  Net -1349.22 ml   Filed Weights   07/08/23 1446 07/09/23 0543 07/10/23 0445  Weight: 72.5 kg 73.5 kg 75.6 kg    Telemetry     Personally reviewed.  Continues to be in atrial flutter.  ECG    Not performed today.  Physical Exam   GEN: No acute distress.   Neck: JVD elevated Cardiac: RRR, no murmur, rub, or gallop.  Respiratory: Nonlabored. Clear to auscultation bilaterally. GI: Soft, nontender, bowel sounds present. MS: No edema; No deformity. Neuro:  Nonfocal. Psych: Alert and oriented x 3. Normal affect.  Labs    Chemistry Recent Labs  Lab 07/09/23 0444 07/09/23 1842 07/10/23 0437  NA 126* 125* 128*  K 4.5 4.8 5.1  CL 85* 83* 87*  CO2 34* 33* 32  GLUCOSE  78 162* 131*  BUN 11 19 16   CREATININE 0.60* 0.49* 0.35*  CALCIUM  8.5* 8.5* 8.7*  PROT  --  6.3*  --   ALBUMIN  --  2.4*  --   AST  --  47*  --   ALT  --  45*  --   ALKPHOS  --  183*  --   BILITOT  --  0.5  --   GFRNONAA >60 >60 >60  ANIONGAP 7 9 9      Hematology Recent Labs  Lab 07/08/23 1536 07/09/23 0444  WBC 8.3 7.9  RBC 3.58* 3.44*  HGB 10.3* 9.9*  HCT 32.1* 31.0*  MCV 89.7 90.1  MCH 28.8 28.8  MCHC 32.1 31.9  RDW 16.4* 16.4*  PLT 363 344    Cardiac Enzymes Recent Labs  Lab 07/08/23 1536 07/08/23 1806  TROPONINIHS 10 9    BNP Recent Labs  Lab 07/08/23 1536 07/10/23 0437  BNP 324.0* 593.0*     DDimerNo results for input(s): "DDIMER" in the last 168 hours.   Radiology    DG Chest 2 View Result Date: 07/08/2023 CLINICAL DATA:  Shortness of  breath EXAM: CHEST - 2 VIEW COMPARISON:  June 08, 2023, 4:59 a.m. FINDINGS: Persistent bilateral pulmonary infiltrates with basilar atelectasis and bilateral pleural effusions findings that could correlate with pneumonia and superimposed congestive changes. Heart and mediastinum upper limits of normal with a left ventricular configuration. IMPRESSION: Persistent pulmonary infiltrates and atelectasis Electronically Signed   By: Fredrich Jefferson M.D.   On: 07/08/2023 15:37     Assessment & Plan   Acute on chronic systolic heart failure: Presented with DOE, orthopnea, PND and leg swelling x 2 weeks.  BNP 324.  2.2L urine output in the last 24 hours with net -1 L on IV Lasix  80 mg twice daily.  Continue IV Lasix  80 mg twice daily, start Entresto 24-26 mg twice daily.  Sotalol  discontinued due to ADHF.  Limited echocardiogram pending today.  He reports severe fatigue likely secondary to extensive metastatic disease however will need to rule out severe iron deficiency anemia due to underlying cardiomyopathy and hemoglobin 9-ish (dropped from 11 couple of months ago).  Will obtain iron panel with ferritin.  Extensive metastatic  disease with possible primary being lung: There is imaging evidence of left lower lobe consolidation, left loculated pleural effusion, metastasis to lymph nodes, bones etc. Follows with oncology.   CAD s/p PCI: No angina.  He had inferolateral STEMI in 2016, s/p T stenting of proximal LCx and OM1, s/p [place] arthrectomy of T stent ISR in 2020.  He has residual eccentric 50 to 70% stenosis of proximal LAD, 70 to 75% apical narrowing and eccentric 90% stenosis of D1 as well as 60 to 70% mid RCA stenosis after acute marginal branch 60% narrowing.  RCA is nondominant. His LVEF remained in 35 to 40% range.  On Plavix  indefinitely, continue Plavix  75 mg once daily.  Follows up with Dr. [name].   Ischemic cardiomyopathy LVEF 35 to 40% and LBBB: Will optimize GDMT and repeat echocardiogram in 3 months. Due to extensive metastatic disease with possible primary being lung, I do not think he will benefit from CRT-D.   Paroxysmal atrial flutter s/p TEE guided DCCV in 2017, ablation in 2017 with Dr. Nunzio Belch on sotalol  since: EKG on admission showed atrial flutter and a prior EKG from April 2025 showed atrial fibrillation.  Rate controlled.  Sotalol  discontinued this admission due to ADHF.  Not a candidate for amiodarone  due to history of pulmonary fibrosis.  Not a candidate for flecainide due to CAD.  Outpatient OSA evaluation.  On Eliquis  5 mg twice daily.   Signed, Lasalle Pointer, MD  07/10/2023, 10:53 AM

## 2023-07-10 NOTE — Progress Notes (Signed)
 Mobility Specialist Progress Note:    07/10/23 1344  Therapy Vitals  Temp 97.7 F (36.5 C)  Temp Source Oral  Pulse Rate 68  BP (!) 165/60  Patient Position (if appropriate) Sitting  Oxygen Therapy  SpO2 98 %  O2 Device Nasal Cannula  O2 Flow Rate (L/min) 4 L/min  Mobility  Activity Ambulated with assistance in room  Level of Assistance Contact guard assist, steadying assist  Assistive Device Front wheel walker  Distance Ambulated (ft) 20 ft  Range of Motion/Exercises Active;All extremities  Activity Response Tolerated well  Mobility Referral Yes  Mobility visit 1 Mobility  Mobility Specialist Start Time (ACUTE ONLY) 1320  Mobility Specialist Stop Time (ACUTE ONLY) 1340  Mobility Specialist Time Calculation (min) (ACUTE ONLY) 20 min   Pt received in chair, agreeable to mobility. Required CGA to stand and ambulate with RW Tolerated well, c/o SOB and fatigue near EOS. Returned to chair, call bell in reach. All needs met.  Glinda Lapping Mobility Specialist Please contact via Special educational needs teacher or  Rehab office at [phone]

## 2023-07-11 ENCOUNTER — Telehealth: Payer: Self-pay

## 2023-07-11 ENCOUNTER — Other Ambulatory Visit (HOSPITAL_COMMUNITY)

## 2023-07-11 DIAGNOSIS — I480 Paroxysmal atrial fibrillation: Secondary | ICD-10-CM | POA: Diagnosis not present

## 2023-07-11 DIAGNOSIS — I4892 Unspecified atrial flutter: Secondary | ICD-10-CM

## 2023-07-11 DIAGNOSIS — I5023 Acute on chronic systolic (congestive) heart failure: Secondary | ICD-10-CM | POA: Diagnosis not present

## 2023-07-11 DIAGNOSIS — D638 Anemia in other chronic diseases classified elsewhere: Secondary | ICD-10-CM | POA: Diagnosis not present

## 2023-07-11 DIAGNOSIS — I5021 Acute systolic (congestive) heart failure: Secondary | ICD-10-CM | POA: Diagnosis not present

## 2023-07-11 LAB — BASIC METABOLIC PANEL WITH GFR
Anion gap: 10 (ref 5–15)
BUN: 12 mg/dL (ref 8–23)
CO2: 38 mmol/L — ABNORMAL HIGH (ref 22–32)
Calcium: 9.3 mg/dL (ref 8.9–10.3)
Chloride: 85 mmol/L — ABNORMAL LOW (ref 98–111)
Creatinine, Ser: <0.3 mg/dL — ABNORMAL LOW (ref 0.61–1.24)
Glucose, Bld: 108 mg/dL — ABNORMAL HIGH (ref 70–99)
Potassium: 4.8 mmol/L (ref 3.5–5.1)
Sodium: 133 mmol/L — ABNORMAL LOW (ref 135–145)

## 2023-07-11 LAB — BRAIN NATRIURETIC PEPTIDE: B Natriuretic Peptide: 988 pg/mL — ABNORMAL HIGH (ref 0.0–100.0)

## 2023-07-11 MED ORDER — POTASSIUM CHLORIDE CRYS ER 20 MEQ PO TBCR
40.0000 meq | EXTENDED_RELEASE_TABLET | Freq: Every day | ORAL | Status: DC
Start: 2023-07-12 — End: 2023-07-12

## 2023-07-11 MED ORDER — SENNOSIDES-DOCUSATE SODIUM 8.6-50 MG PO TABS
1.0000 | ORAL_TABLET | Freq: Every day | ORAL | Status: DC
  Administered 2023-07-11 – 2023-07-13 (×3): 1 via ORAL
  Filled 2023-07-11 (×3): qty 1

## 2023-07-11 NOTE — Care Management Important Message (Signed)
 Important Message  Patient Details  Name: Curtis Flores MRN: [id number] Date of Birth: August 04, 1955   Important Message Given:  Yes - Medicare IM     [name] 07/11/2023, 2:52 PM

## 2023-07-11 NOTE — Progress Notes (Deleted)
 Office Visit Note  Patient: Curtis Flores             Date of Birth: 1955-10-06           MRN: [id number]             PCP: [name], PA-C Referring: Emaline Handsome, MD Visit Date: 07/25/2023 Occupation: @GUAROCC @  Subjective:  No chief complaint on file.   History of Present Illness: Curtis Flores is a 68 y.o. male ***     Activities of Daily Living:  Patient reports morning stiffness for *** {minute/hour:19697}.   Patient {ACTIONS;DENIES/REPORTS:21021675::"Denies"} nocturnal pain.  Difficulty dressing/grooming: {ACTIONS;DENIES/REPORTS:21021675::"Denies"} Difficulty climbing stairs: {ACTIONS;DENIES/REPORTS:21021675::"Denies"} Difficulty getting out of chair: {ACTIONS;DENIES/REPORTS:21021675::"Denies"} Difficulty using hands for taps, buttons, cutlery, and/or writing: {ACTIONS;DENIES/REPORTS:21021675::"Denies"}  No Rheumatology ROS completed.   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) 06/07/2023   HCAP (healthcare-associated pneumonia) 06/06/2023   Chest pain 01/13/2023   Immunocompromised patient (HCC) 04/22/2021   Cataract 03/23/2021   Immunization counseling 03/09/2021   Medication monitoring encounter 12/01/2020   TB lung, 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/09/2020   Atrial fibrillation with RVR (HCC) 05/28/2018   Chronic anticoagulation 05/28/2018   NSTEMI (non-ST elevated myocardial infarction) (HCC) 05/09/2018   Ischemic cardiomyopathy 12/04/2016   Leukocytosis 07/17/2016   Pain in joint involving multiple sites 04/25/2016   Anemia 04/16/2016   Coronary artery disease 04/16/2016    History of atrial fibrillation 04/16/2016   Psoriatic arthritis (HCC) 02/14/2016   High risk medication use 02/14/2016   NICM (nonischemic cardiomyopathy) (HCC) 08/29/2015   Essential hypertension 08/29/2015   Abnormal CT scan, stomach    Hiatal hernia    GERD (gastroesophageal reflux disease) 08/01/2015   Weight loss, unintentional 08/01/2015   Iron deficiency anemia 06/06/2015   Heme positive stool 06/06/2015   PAF (paroxysmal atrial fibrillation) (HCC) 05/30/2015   CAD S/P percutaneous coronary angioplasty 04/07/2015   Old lateral wall myocardial infarction 04/07/2015   Acute on chronic systolic congestive heart failure (HCC) 04/07/2015   LBBB (left bundle branch block) 04/07/2015   Wide-complex tachycardia 04/07/2015   Atrial flutter (HCC) 04/07/2015   Mixed hyperlipidemia 03/07/2015   Psoriasis    Rheumatoid arthritis (HCC)    Pulmonary fibrosis (HCC) 03/21/2011    Past Medical History:  Diagnosis Date   Abnormal CT scan, stomach    Chronic systolic dysfunction of left ventricle    Coronary artery disease    lateral STEMI 02/22/15 3/10 cutting balloon to ISR of the o/pLCX   GERD (gastroesophageal reflux disease)    Hyperlipidemia    Hypertension    Ischemic cardiomyopathy    LBBB (left bundle branch block)    Leukocytosis    followed by hematology, reactive   Lymphadenopathy    Myocardial infarction (HCC) 02/2015   Psoriasis 2003   Psoriatic arthritis (HCC)    Pulmonary fibrosis (HCC)    Rheumatoid arthritis(714.0) 2012   Typical atrial flutter (HCC)     Family History  Problem Relation Age of Onset   Hypertension Mother    Colon cancer Neg Hx    Past Surgical History:  Procedure Laterality Date   CARDIAC CATHETERIZATION N/A  02/22/2015   Procedure: Left Heart Cath and Coronary Angiography;  Surgeon: Avanell Leigh, MD;  Location: [facility] INVASIVE CV LAB;  Service: Cardiovascular;  Laterality: N/A;   CARDIAC CATHETERIZATION N/A 02/22/2015   Procedure: Coronary  Stent Intervention;  Surgeon: Avanell Leigh, MD;  Location: [facility] INVASIVE CV LAB;  Service: Cardiovascular;  Laterality: N/A;   CARDIOVERSION N/A 04/12/2015   Procedure: CARDIOVERSION;  Surgeon: Darlis Eisenmenger, MD;  Location: [facility] ENDOSCOPY;  Service: Cardiovascular;  Laterality: N/A;   COLONOSCOPY  07/01/2003   ZOX:WRUEAV colonic mucosa except for the proximal right colon in the area of ICV which was not seen completely due to inadequate bowel prep. followed with ACBE which was normal.    COLONOSCOPY N/A 08/24/2015   Dr. Riley Cheadle: Normal colon. Next colonoscopy in 10 years.   CORONARY BALLOON ANGIOPLASTY N/A 05/12/2018   Procedure: CORONARY BALLOON ANGIOPLASTY;  Surgeon: Arty Binning, MD;  Location: [facility] INVASIVE CV LAB;  Service: Cardiovascular;  Laterality: N/A;   ELECTROPHYSIOLOGIC STUDY N/A 05/30/2015   Atrial fibrillation ablation by Dr Nunzio Belch   ESOPHAGOGASTRODUODENOSCOPY N/A 08/24/2015   Dr. Riley Cheadle: Medium-sized hiatal hernia, erosive gastropathy. Cameron lesions. Esophageal mucosa distally suggestive of short segment Barrett's esophagus. Not confirmed on biopsy. Gastric biopsy with minimal chronic inflammation   GIVENS CAPSULE STUDY N/A 04/17/2016   Procedure: GIVENS CAPSULE STUDY;  Surgeon: Suzette Espy, MD;  Location: [facility] ENDO SUITE;  Service: Endoscopy;  Laterality: N/A;  Pt to arrive at 8:00 am for 8:30 am appt   LEFT HEART CATH AND CORONARY ANGIOGRAPHY N/A 05/11/2018   Procedure: LEFT HEART CATH AND CORONARY ANGIOGRAPHY;  Surgeon: Arty Binning, MD;  Location: [facility] INVASIVE CV LAB;  Service: Cardiovascular;  Laterality: N/A;   TEE WITHOUT CARDIOVERSION N/A 04/12/2015   Procedure: TRANSESOPHAGEAL ECHOCARDIOGRAM (TEE);  Surgeon: Darlis Eisenmenger, MD;  Location: [facility] ENDOSCOPY;  Service: Cardiovascular;  Laterality: N/A;   Social History   Social History Narrative   Not on file   Immunization History  Administered Date(s) Administered   Fluad Quad(high Dose 65+) 12/09/2018, 11/01/2020, 11/17/2021    Influenza Split 12/22/2010   Influenza Whole 01/18/2010   Influenza, High Dose Seasonal PF 11/11/2017, 12/02/2018   Influenza, Seasonal, Injecte, Preservative Fre 12/03/2014, 12/13/2015, 12/03/2017   Influenza,inj,Quad PF,6+ Mos 11/29/2019   Influenza-Unspecified 12/03/2014, 12/13/2015, 11/12/2016   Moderna Sars-Covid-2 Vaccination 05/08/2019, 06/09/2019, 01/29/2020, 08/09/2020   PFIZER(Purple Top)SARS-COV-2 Vaccination 10/23/2020   Pneumococcal Conjugate-13 08/20/2016, 04/07/2018   Pneumococcal Polysaccharide-23 01/22/2011, 03/05/2011, 03/09/2021   Pneumococcal-Unspecified 01/22/2011, 03/05/2011   Unspecified SARS-COV-2 Vaccination 12/14/2021     Objective: Vital Signs: There were no vitals taken for this visit.   Physical Exam   Musculoskeletal Exam: ***  CDAI Exam: CDAI Score: -- Patient Global: --; Provider Global: -- Swollen: --; Tender: -- Joint Exam 07/25/2023   No joint exam has been documented for this visit   There is currently no information documented on the homunculus. Go to the Rheumatology activity and complete the homunculus joint exam.  Investigation: No additional findings.  Imaging: DG Chest 2 View Result Date: 07/08/2023 CLINICAL DATA:  Shortness of breath EXAM: CHEST - 2 VIEW COMPARISON:  June 08, 2023, 4:59 a.m. FINDINGS: Persistent bilateral pulmonary infiltrates with basilar atelectasis and bilateral pleural effusions findings that could correlate with pneumonia and superimposed congestive changes. Heart and mediastinum upper limits of normal with a left ventricular configuration. IMPRESSION: Persistent pulmonary infiltrates and atelectasis Electronically Signed   By: Fredrich Jefferson M.D.   On: 07/08/2023 15:37  NM PET Image Initial (PI) Skull Base To Thigh Result Date: 06/30/2023 CLINICAL DATA:  Initial treatment strategy for non-small-cell lung cancer. EXAM: NUCLEAR MEDICINE PET SKULL BASE TO THIGH TECHNIQUE: 8.06 mCi F-18 FDG was injected  intravenously. Full-ring PET imaging was performed from the skull base to thigh after the radiotracer. CT data was obtained and used for attenuation correction and anatomic localization. Fasting blood glucose: 74 mg/dl COMPARISON:  Chest CT with contrast 06/06/2023. Older exams as well. Chest x-ray 06/08/2023 FINDINGS: Mediastinal blood pool activity: SUV max 2.4 Liver activity: SUV max 2.4 NECK: Hypermetabolic lymph node seen to the right of the thyroid gland with maximum SUV value of 8.8. Lesion on image 35 of the CT scan series 202, measures 17 by 14 mm. No additional areas of abnormal soft tissue uptake identified in the neck including submandibular or posterior triangle regions. No left-sided nodal hypermetabolic uptake. There is symmetric uptake of the visualized intracranial compartment. Incidental CT findings: Visualized paranasal sinuses and mastoid air cells are clear. Mild streak artifact related to the patient's dental hardware. The parotid glands, submandibular glands and thyroid gland is unremarkable. CHEST: Subtle areas of uptake seen along nodes in the mediastinum and left hilum. Focus of most intense uptake in the left lung hilum has maximum SUV value of 7.0. Nodular thickening in this location on image 60. Nodal enlargement better seen on the prior contrast CT scan. Uptake in the mediastinum includes subcarinal, paratracheal and prevascular nodes. AP window lymph node. Uptake along the AP window node has maximum SUV of 4.8. Node seen on image 52. Additional area of uptake along right hilum with maximum SUV of 4.3. Patchy areas of uptake identified along left hemithorax in the left lower lobe with there is large area of consolidation. The central area of uptake has maximum SUV value of 5.6 diameter of this area on image 65 measures 2.2 cm. Distribution of the opacities similar to the prior contrast chest CT. There is also areas of confluent opacity seen in the lingula and right lower lobe. Areas of  pleural effusion again seen including some loculated components on the left extending along the interlobar fissure towards the left lung apex. Incidental CT findings: Heart is nonenlarged. Coronary artery calcifications are seen. The thoracic aorta also has significant calcified plaque. Diffuse areas of interstitial septal thickening with some emphysematous changes and breathing motion. ABDOMEN/PELVIS: Some small foci of uptake seen in the liver. Most pronounced is seen in the right hepatic lobe with maximum SUV value of 4.8. This is seen on image 77 with diameter of uptake approaching 10 mm. Intrinsic liver lesion is possible. There is a low-density lesion seen on the prior CT scan in this location as well. There is also some uptake along both adrenal glands. More pronounced on the left with focal nodule. Maximum SUV value 6.3. Area measures on the prior CT scan with contrast at 15 by 18 mm. Physiologic uptake along the bowel and renal collecting systems. Incidental CT findings: To extensive vascular calcifications along the aorta and branch vessels. No obstructing renal or ureteral stones. Motion. The bowel is nondilated. Scattered colonic stool. Gallbladder is nondilated. SKELETON: Extensive osseous metastatic disease identified with extensive areas of sclerosis. There are patchy areas identified including multiple ribs, bilateral scapula, right humerus, sternum, clavicles. Extensive areas along the spine and pelvis. Bilateral femurs are involved. Example areas of uptake include left hemi sacrum with maximum SUV value of 11.2. Maximum SUV value of 7.9 involving the anterior aspect of the  left third rib. Right humeral head maximum SUV 5.1. Incidental CT findings: Scattered degenerative changes. IMPRESSION: Extensive metastatic disease identified. Extensive bony sclerosis throughout the skeleton with areas of increased uptake. Numerous abnormal lymph nodes identified involving the mediastinum, hilum as well as the  right thoracic inlet, supraclavicular region. Metastatic foci involving the left adrenal gland and likely a focus in the right hepatic lobe of the liver. Loculated pleural effusion on the left and non loculated right. Persistent consolidative opacities identified greatest in the left lower lobe with a central area of increased uptake in the area of consolidation in left lower lobe. Additional area of disease is possible. Please correlate for primary neoplasm. Electronically Signed   By: Adrianna Horde M.D.   On: 06/30/2023 14:10   DG HIP UNILAT WITH PELVIS 2-3 VIEWS LEFT Result Date: 06/18/2023 CLINICAL DATA:  metastatic cancer to hip/pelvis EXAM: DG HIP (WITH OR WITHOUT PELVIS) 2-3V LEFT; DG HIP (WITH OR WITHOUT PELVIS) 2-3V RIGHT COMPARISON:  CT scan abdomen and pelvis from 06/12/2023. FINDINGS: Pelvis is intact with normal and symmetric sacroiliac joints. No acute fracture or dislocation. Redemonstration of extensive sclerotic lesions throughout the bones, compatible with patient's known history of metastasis. Visualized sacral arcuate lines are unremarkable. Unremarkable symphysis pubis. There are mild degenerative changes of bilateral hip joints without significant joint space narrowing. Osteophytosis of the superior acetabulum. No radiopaque foreign bodies. IMPRESSION: No acute fracture or dislocation. Redemonstration of extensive sclerotic lesions throughout the bones, compatible with patient's known history of metastasis. Electronically Signed   By: Beula Brunswick M.D.   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 FINDINGS: Pelvis is intact with normal and symmetric sacroiliac joints. No acute fracture or dislocation. Redemonstration of extensive sclerotic lesions  throughout the bones, compatible with patient's known history of metastasis. Visualized sacral arcuate lines are unremarkable. Unremarkable symphysis pubis. There are mild degenerative changes of bilateral hip joints without significant joint space narrowing. Osteophytosis of the superior acetabulum. No radiopaque foreign bodies. IMPRESSION: No acute fracture or dislocation. Redemonstration of extensive sclerotic lesions throughout the bones, compatible with patient's known history of metastasis. Electronically Signed   By: Beula Brunswick M.D.   On: 06/18/2023 14:44   CT ABDOMEN PELVIS W CONTRAST Result Date: 06/12/2023 CLINICAL DATA:  History of pneumonia and pleural effusion, suspected bony metastasis on prior chest CT, assess for metastatic disease EXAM: CT ABDOMEN AND PELVIS WITH CONTRAST TECHNIQUE: Multidetector CT imaging of the abdomen and pelvis was performed using the standard protocol following bolus administration of intravenous contrast. RADIATION DOSE REDUCTION: This exam was performed according to the departmental dose-optimization program which includes automated exposure control, adjustment of the mA and/or kV according to patient size and/or use of iterative reconstruction technique. CONTRAST:  75mL OMNIPAQUE  IOHEXOL  350 MG/ML SOLN COMPARISON:  06/06/2023 FINDINGS: Lower chest: Stable bibasilar fibrosis and left lower lobe consolidation. Stable small bilateral pleural effusions, partially loculated on the left. Hepatobiliary: Indeterminate 0.8 cm hypodensity within the right lobe liver image 16/3. Otherwise no focal liver abnormality. No gallstones, gallbladder wall thickening, or biliary dilatation. Pancreas: Unremarkable. No pancreatic ductal dilatation or surrounding inflammatory changes. Spleen: Normal in size without focal abnormality. Adrenals/Urinary Tract: There is a 1.5 cm indeterminate left adrenal lesion, measuring 49 HU, new since the  09/20/2021 chest CT. Right adrenal is  unremarkable. Minimal areas of bilateral renal cortical scarring are noted. Otherwise the kidneys enhance normally and symmetrically. No urinary tract calculi or obstructive uropathy. Bladder is unremarkable. Stomach/Bowel: No bowel obstruction or ileus. Normal appendix right lower quadrant. No bowel wall thickening or inflammatory change. Small hiatal hernia. Vascular/Lymphatic: Diffuse atherosclerosis of the aorta and its branches. High-grade stenoses of the bilateral common and external iliac arteries, right greater than left. No pathologic adenopathy within the abdomen or pelvis. Reproductive: The prostate is not enlarged. Other: No free fluid or free intraperitoneal gas. No abdominal wall hernia. Musculoskeletal: Diffuse sclerotic lesions are seen throughout the visualized thoracolumbar spine, thoracic cage, bony pelvis, and bilateral hips, compatible with diffuse bony metastases. The extensive involvement of the intertrochanteric regions of the bilateral hips may place the patient at risk for fracture. No acute or pathologic fractures are evident on this exam. Reconstructed images demonstrate no additional findings. IMPRESSION: 1. Diffuse sclerotic bony metastases as above. Extensive involvement of the bilateral hips may place the patient at risk for fracture. In a male patient of this age, prostate cancer is the diagnosis of exclusion. 2. Indeterminate left adrenal lesion, new since 10/21/2021 chest CT. Metastatic disease cannot be excluded. 3. Indeterminate subcentimeter hypodensity within the right lobe liver, too small to characterize. 4. Aortic Atherosclerosis (ICD10-I70.0). High-grade stenoses are seen within the bilateral common and external iliac distributions. 5. Stable findings at the lung bases, with bilateral effusions, bibasilar fibrosis, left basilar consolidation again noted. Please see recent CT chest report. Electronically Signed   By: Bobbye Burrow M.D.   On: 06/12/2023 16:15    Recent  Labs: Lab Results  Component Value Date   WBC 7.9 07/09/2023   HGB 9.9 (L) 07/09/2023   PLT 344 07/09/2023   NA 133 (L) 07/11/2023   K 4.8 07/11/2023   CL 85 (L) 07/11/2023   CO2 38 (H) 07/11/2023   GLUCOSE 108 (H) 07/11/2023   BUN 12 07/11/2023   CREATININE <0.30 (L) 07/11/2023   BILITOT 0.5 07/09/2023   ALKPHOS 183 (H) 07/09/2023   AST 47 (H) 07/09/2023   ALT 45 (H) 07/09/2023   PROT 6.3 (L) 07/09/2023   ALBUMIN  2.4 (L) 07/09/2023   CALCIUM  9.3 07/11/2023   GFRAA 114 03/14/2020   QFTBGOLDPLUS Positive (A) 08/02/2020    Speciality Comments: No specialty comments available.  Procedures:  No procedures performed Allergies: Patient has no known allergies.   Assessment / Plan:     Visit Diagnoses: No diagnosis found.  Orders: No orders of the defined types were placed in this encounter.  No orders of the defined types were placed in this encounter.   Face-to-face time spent with patient was *** minutes. Greater than 50% of time was spent in counseling and coordination of care.  Follow-Up Instructions: No follow-ups on file.   Dee Farber, CMA  Note - This record has been created using Animal nutritionist.  Chart creation errors have been sought, but may not always  have been located. Such creation errors do not reflect on  the standard of medical care.

## 2023-07-11 NOTE — Plan of Care (Signed)

## 2023-07-11 NOTE — Progress Notes (Signed)
 Progress Note  Patient Name: Curtis Flores Date of Encounter: 07/11/2023  Primary Cardiologist: [name], MD  Subjective   Leg swelling resolved.  SOB also improved compared to yesterday.  Feels extremely tired/has severe fatigue even while eating and rolling over to his side.  Inpatient Medications    Scheduled Meds:  apixaban   5 mg Oral BID   atorvastatin   80 mg Oral q1800   azaTHIOprine   50 mg Oral Daily   clopidogrel   75 mg Oral Daily   feeding supplement  237 mL Oral BID BM   furosemide   80 mg Intravenous BID   ipratropium  0.5 mg Nebulization TID   levalbuterol   1.25 mg Nebulization TID   liver oil-zinc  oxide   Topical TID   methylPREDNISolone  (SOLU-MEDROL ) injection  40 mg Intravenous Q12H   pantoprazole   40 mg Oral Daily   [START ON 07/12/2023] potassium chloride  SA  40 mEq Oral Daily   Continuous Infusions:   PRN Meds: HYDROmorphone  (DILAUDID ) injection, levalbuterol , oxyCODONE -acetaminophen    Vital Signs    Vitals:   07/10/23 1428 07/10/23 2006 07/11/23 0051 07/11/23 0312  BP:   (!) 162/64 (!) 149/54  Pulse:   74 74  Resp:   17 20  Temp:   (!) 97.2 F (36.2 C) 97.8 F (36.6 C)  TempSrc:    Oral  SpO2: 97% 97% 100% 98%  Weight:    72.8 kg  Height:        Intake/Output Summary (Last 24 hours) at 07/11/2023 0935 Last data filed at 07/11/2023 0932 Gross per 24 hour  Intake 240 ml  Output 2600 ml  Net -2360 ml   Filed Weights   07/09/23 0543 07/10/23 0445 07/11/23 0312  Weight: 73.5 kg 75.6 kg 72.8 kg    Telemetry     Personally reviewed. In NSR, but will to r/o slow atrial flutter with EKG.  ECG    ECG pending  Physical Exam   GEN: No acute distress.   Neck: JVD elevated Cardiac: RRR, no murmur, rub, or gallop.  Respiratory: Nonlabored. Clear to auscultation bilaterally. GI: Soft, nontender, bowel sounds present. MS: Trace pitting edema; No deformity. Neuro:  Nonfocal. Psych: Alert and oriented x 3. Normal affect.  Labs     Chemistry Recent Labs  Lab 07/09/23 1842 07/10/23 0437 07/11/23 0253  NA 125* 128* 133*  K 4.8 5.1 4.8  CL 83* 87* 85*  CO2 33* 32 38*  GLUCOSE 162* 131* 108*  BUN 19 16 12   CREATININE 0.49* 0.35* <0.30*  CALCIUM  8.5* 8.7* 9.3  PROT 6.3*  --   --   ALBUMIN  2.4*  --   --   AST 47*  --   --   ALT 45*  --   --   ALKPHOS 183*  --   --   BILITOT 0.5  --   --   GFRNONAA >60 >60 NOT CALCULATED  ANIONGAP 9 9 10      Hematology Recent Labs  Lab 07/08/23 1536 07/09/23 0444  WBC 8.3 7.9  RBC 3.58* 3.44*  HGB 10.3* 9.9*  HCT 32.1* 31.0*  MCV 89.7 90.1  MCH 28.8 28.8  MCHC 32.1 31.9  RDW 16.4* 16.4*  PLT 363 344    Cardiac Enzymes Recent Labs  Lab 07/08/23 1536 07/08/23 1806  TROPONINIHS 10 9    BNP Recent Labs  Lab 07/08/23 1536 07/10/23 0437 07/11/23 0253  BNP 324.0* 593.0* 988.0*     DDimerNo results for input(s): "DDIMER" in the  last 168 hours.   Radiology    No results found.  Assessment & Plan   Acute on chronic systolic heart failure: Presented with DOE, orthopnea, PND and leg swelling x 2 weeks.  BNP 324. 3.3L urine output in the last 24 hours with net -2.8 mL on IV Lasix  80 mg twice daily.  Continue IV Lasix  80 mg twice daily, start Entresto 24-26 mg twice daily.  Sotalol  discontinued due to ADHF. Limited echocardiogram scheduled for today.  Extensive metastatic disease with possible primary being lung: There is imaging evidence of left lower lobe consolidation, left loculated pleural effusion, metastasis to lymph nodes, bones etc. Follows with oncology.  Iron deficiency and anemia of chronic inflammation: Iron labs obtained showed iron deficiency and anemia of chronic inflammation.  Will obtain soluble transferrin to determine if he needs iron infusions. Curbsided with oncology.   CAD s/p PCI: No angina.  He had inferolateral STEMI in 2016, s/p T stenting of proximal LCx and OM1, s/p Cutting Balloon arthrectomy of T stent ISR in 2020.  He has  residual eccentric 50 to 70% stenosis of proximal LAD, 70 to 75% apical narrowing and eccentric 90% stenosis of D1 as well as 60 to 70% mid RCA stenosis after acute marginal branch 60% narrowing.  RCA is nondominant. His LVEF remained in 35 to 40% range.   On Plavix  indefinitely, continue Plavix  75 mg once daily.  Follows with Dr. [name].   Ischemic cardiomyopathy LVEF 35 to 40% and LBBB: Will optimize GDMT and repeat echocardiogram in 3 months.  Due to extensive metastatic disease with possible primary being lung, I do not think he will benefit from CRT-D but will revisit the conversation after he is evaluated by oncology.   Paroxysmal atrial flutter s/p TEE guided DCCV in 2017, ablation in 2017 with Dr. Nunzio Belch on sotalol  since: EKG on admission showed atrial flutter and a prior EKG from April 2025 showed atrial fibrillation.  Rate controlled.  Telemetry reviewed, in normal sinus rhythm.  Will confirm with EKG.  Sotalol  discontinued this admission due to ADHF.  Not a candidate for amiodarone  due to history of pulmonary fibrosis.  Not a candidate for flecainide due to CAD.  Outpatient OSA evaluation.  On Eliquis  5 mg twice daily.   Signed, Lasalle Pointer, MD  07/11/2023, 9:35 AM

## 2023-07-11 NOTE — Progress Notes (Signed)
 PROGRESS NOTE    Patient: Curtis Flores                            PCP: [name], PA-C                    DOB: 11/21/1955            DOA: 07/08/2023 [id number]             DOS: 07/11/2023, 12:03 PM   LOS: 3 days   Date of Service: The patient was seen and examined on 07/11/2023  Subjective:   The patient was seen and examined this morning, awake alert oriented, mildly tachypneic with respiratory rate of 25, stating he has had shortness of breath with wheezing, just got a breathing treatment Was up to 4 L of oxygen this morning satting 96-98% Diuresing well   Brief Narrative:   KAEL GREENEY is a 68 y.o. male with medical history significant of PAF, MI, pulmonary fibrosis, HLD, HTN, Prostate Ca. Adenocarcinoma of the lung.    Presenting w/ several day worsening SOB. No fever, CP, N/V. Associated w/ LE swelling. Taking home lasix  w/o benefit. EMS called to evaluate pt and found to be hypoxic on home O2 of 2L [place].    ED Course: Workup as below. 40mg  IV lasix  x1 given.      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 W  Pulmonary fibrosis, decompensated CHF in conjunction w/ recent CAP and possible pulmonary adenocarcinoma.  -Baseline 2L [place]. Improved. >>>  Still up to 4 L of oxygen, satting 96% -DuoNeb bronchodilators, mucolytics,  - Acute decompensated CHF :  - Lasix  40 mg increased to IV 80 mg twice a day, -Nebs as needed, mucolytics   - Continuing IV Solu-Medrol     Acute on Chronic combined systolic and diastolic CHF.  -Continue to have shortness of breath, +@ pitting edema Although  improving - On Lasix  80 mg IV twice daily-diuresing well - Monitoring I's and O's and daily weight  Echo from 03/2022 showing EF 35% and Grade 1 diastolic dysfunction.  Baseline Lasix  40mg  PO BID  - Strict I/O   -Consulting cardiology appreciate further evaluation recommendations - DC sotalol , due to ADHF.  Not a candidate for amiodarone  due to pulmonary fibrosis, not a candidate for flecainide due to CAD, - Holding ACE, ARB's due to aggressive diuresis on Lasix  for now   Intake/Output Summary (Last 24 hours) at 07/11/2023 1158 Last data filed at 07/11/2023 1106 Gross per 24 hour  Intake 240 ml  Output 2750 ml  Net -2510 ml   Continue IV Lasix  80 mg twice daily,  start Entresto 24-26 mg twice daily.    Limited echocardiogram scheduled for today-07/11/2023   Decubitus ulcers: Chronic.  - Wound care.    Prostate Ca / Adenocarcinoma Lung: known metastatic prostate cancer. 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  - No active mgt - defer to ongoing Oncology workup -Has a follow-up appointment next week with Dr. Cheree Cords   Hyponatremia/hypochloremia Editha Goring - Nephrology curb sided -  Replace albumin , -  discontinuing supplement sodium tablets - Urine sodium 125, 126 >>> 128 >> 133   HTN: - hold home Losartan  and Norvasc  as diuresing    PAF/CAD: currently in Afib. H/o cath/stent placement - Discontinue sotalol , continue Eliquis  + Plavix -per cardiology   Anemia: Normocytic. Chronic disease.  POA hgb 10.3 on admission. Baseline 11.3.  - Monitor for now. Likely worse due to chronic illness w/ acute worsening recently.      ----------------------------------------------------------------------------------------------------------------------------------------------- Nutritional status:  The patient's BMI is: Body mass index is 25.9 kg/m. I agree with the assessment and plan as outlined   DVT prophylaxis:  apixaban  (ELIQUIS ) tablet 5 mg   Code  Status:   Code Status: Full Code  Family Communication: -Advance care planning has been discussed.   Admission status:   Status is: Inpatient Remains inpatient appropriate because: Needing IV diuretics, respiratory support   Disposition: From  - home             Planning for discharge in 1-2 days: to   Procedures:   No admission procedures for hospital encounter.   Antimicrobials:  Anti-infectives (From admission, onward)    None        Medication:   apixaban   5 mg Oral BID   atorvastatin   80 mg Oral q1800   azaTHIOprine   50 mg Oral Daily   clopidogrel   75 mg Oral Daily   feeding supplement  237 mL Oral BID BM   furosemide   80 mg Intravenous BID   ipratropium  0.5 mg Nebulization TID   levalbuterol   1.25 mg Nebulization TID   liver oil-zinc  oxide   Topical TID   methylPREDNISolone  (SOLU-MEDROL ) injection  40 mg Intravenous Q12H   pantoprazole   40 mg Oral Daily   [START ON 07/12/2023] potassium chloride  SA  40 mEq Oral Daily    HYDROmorphone  (DILAUDID ) injection, levalbuterol , oxyCODONE -acetaminophen    Objective:   Vitals:   07/11/23 0051 07/11/23 0312 07/11/23 0745 07/11/23 0820  BP: (!) 162/64 (!) 149/54    Pulse: 74 74    Resp: 17 20 11    Temp: (!) 97.2 F (36.2 C) 97.8 F (36.6 C)    TempSrc:  Oral    SpO2: 100% 98%  96%  Weight:  72.8 kg    Height:        Intake/Output Summary (Last 24 hours) at 07/11/2023 1203 Last data filed at 07/11/2023 1106 Gross per 24 hour  Intake 240 ml  Output 2750 ml  Net -2510 ml   Filed Weights   07/09/23 0543 07/10/23 0445 07/11/23 0312  Weight: 73.5 kg 75.6 kg 72.8 kg     Physical examination:         General:  AAO x 3,  cooperative, shortness of breath with O2 by nasal cannula  HEENT:  Normocephalic, PERRL, otherwise with in Normal limits   Neuro:  CNII-XII intact. , normal motor and sensation, reflexes intact   Lungs:   Clear to auscultation BL, Respirations unlabored,  No wheezes /mild bilateral  lower lobe crackles  Cardio:    S1/S2, RRR, No murmure, No Rubs or Gallops   Abdomen:  Soft, non-tender, bowel sounds active all four quadrants, no guarding or peritoneal signs.  Muscular  skeletal:  Limited exam -global generalized weaknesses - in bed, able to move all 4 extremities,   2+ pulses,  symmetric, +2 pitting edema-improving  Skin:  Dry, warm to touch, negative for any Rashes,  Wounds: Please see nursing documentation          ------------------------------------------------------------------------------------------------------------------------------------------  LABs:     Latest Ref Rng & Units 07/09/2023    4:44 AM 07/08/2023    3:36 PM 06/18/2023    1:53 PM  CBC  WBC 4.0 - 10.5 K/uL 7.9  8.3  11.7   Hemoglobin 13.0 - 17.0 g/dL 9.9  66.4  40.3   Hematocrit 39.0 - 52.0 % 31.0  32.1  38.5   Platelets 150 - 400 K/uL 344  363  276       Latest Ref Rng & Units 07/11/2023    2:53 AM 07/10/2023    4:37 AM 07/09/2023    6:42 PM  CMP  Glucose 70 - 99 mg/dL 474  259  563   BUN 8 - 23 mg/dL 12  16  19    Creatinine 0.61 - 1.24 mg/dL <8.75  6.43  3.29   Sodium 135 - 145 mmol/L 133  128  125   Potassium 3.5 - 5.1 mmol/L 4.8  5.1  4.8   Chloride 98 - 111 mmol/L 85  87  83   CO2 22 - 32 mmol/L 38  32  33   Calcium  8.9 - 10.3 mg/dL 9.3  8.7  8.5   Total Protein 6.5 - 8.1 g/dL   6.3   Total Bilirubin 0.0 - 1.2 mg/dL   0.5   Alkaline Phos 38 - 126 U/L   183   AST 15 - 41 U/L   47   ALT 0 - 44 U/L   45        Micro Results No results found for this or any previous visit (from the past 240 hours).  Radiology Reports No results found.   SIGNED: Bobbetta Burnet, MD, FHM. FAAFP. Arlin Benes - [facility] Time spent - 55 min.  In seeing, evaluating and examining the patient. Reviewing medical records, labs, drawn plan of care. [facility],  Pager (please use [email or web address] to page/ text) Please use Epic Secure Chat for non-urgent communication (7AM-7PM)  If  7PM-7AM, please contact night-coverage [email or web address], 07/11/2023, 12:03 PM

## 2023-07-11 NOTE — Progress Notes (Signed)
 Mobility Specialist Progress Note:    07/11/23 0930  Mobility  Activity Ambulated with assistance in hallway  Level of Assistance Contact guard assist, steadying assist  Assistive Device Front wheel walker  Distance Ambulated (ft) 30 ft  Range of Motion/Exercises Active;All extremities  Activity Response Tolerated well  Mobility Referral Yes  Mobility visit 1 Mobility  Mobility Specialist Start Time (ACUTE ONLY) 0930  Mobility Specialist Stop Time (ACUTE ONLY) 1005  Mobility Specialist Time Calculation (min) (ACUTE ONLY) 35 min   Pt received in bed, agreeable to mobility. Required CGA to stand and ambulate with RW. Tolerated well, asx throughout. Returned pt supine, alarm on. All needs met.  Ayoub Arey Mobility Specialist Please contact via Special educational needs teacher or  Rehab office at [phone]

## 2023-07-11 NOTE — Progress Notes (Signed)
 Heart Failure Nurse Navigator Progress Note  PCP: [name], PA-C PCP-Cardiologist: [name], MD  Admission Diagnosis: Acute on chronic heart failure, unspecified heart failure type [facility]) Admitted from: [facility]  Presentation:   Curtis Flores presented with shortness of breath and bilateral leg edema. Has CHF.  Wears 2L O2 [place] at home.  Resent admission for pneumonia-thoracentesis result showed adenocarcinoma.  Followed by oncology. Chest-x-ray persistent pulmonary infiltrates. BNP 324, 593, 988.  ECHO/ LVEF: 35-40% 01/14/2023. Echo scheduled for 07/11/2023  Clinical Course:  Past Medical History:  Diagnosis Date   Abnormal CT scan, stomach    Chronic systolic dysfunction of left ventricle    Coronary artery disease    lateral STEMI 02/22/15 3/10 cutting balloon to ISR of the o/pLCX   GERD (gastroesophageal reflux disease)    Hyperlipidemia    Hypertension    Ischemic cardiomyopathy    LBBB (left bundle branch block)    Leukocytosis    followed by hematology, reactive   Lymphadenopathy    Myocardial infarction (HCC) 02/2015   Psoriasis 2003   Psoriatic arthritis (HCC)    Pulmonary fibrosis (HCC)    Rheumatoid arthritis(714.0) 2012   Typical atrial flutter (HCC)      Social History   Socioeconomic History   Marital status: Married    Spouse name: Adell Hones   Number of children: 2   Years of education: Not on file   Highest education level: 9th grade  Occupational History   Occupation: unemployed    Comment: not working do to arthritis; used to be Production designer, theatre/television/film for a Programmer, systems  Tobacco Use   Smoking status: Former    Current packs/day: 0.00    Average packs/day: 1 pack/day for 30.0 years (30.0 ttl pk-yrs)    Types: Cigarettes    Start date: 03/20/1973    Quit date: 03/21/2003    Years since quitting: 20.3    Passive exposure: Past   Smokeless tobacco: Never  Vaping Use   Vaping status: Never Used  Substance and Sexual Activity    Alcohol use: No    Alcohol/week: 0.0 standard drinks of alcohol    Comment: H/O case of beer weekly x 20 years, quiting in 2000-ish.   Drug use: No    Comment: H/O marijuana use many years ago.   Sexual activity: Yes    Birth control/protection: None  Other Topics Concern   Not on file  Social History Narrative   Not on file   Social Drivers of Health   Financial Resource Strain: Low Risk  (04/29/2023)   Received from [facility]   Overall Financial Resource Strain (CARDIA)    Difficulty of Paying Living Expenses: Not very hard  Food Insecurity: No Food Insecurity (07/11/2023)   Hunger Vital Sign    Worried About Running Out of Food in the Last Year: Never true    Ran Out of Food in the Last Year: Never true  Transportation Needs: No Transportation Needs (07/11/2023)   PRAPARE - Administrator, Civil Service (Medical): No    Lack of Transportation (Non-Medical): No  Physical Activity: Unknown (04/29/2023)   [facility]   Exercise Vital Sign    Days of Exercise per Week: 0 days    Minutes of Exercise per Session: Not on file  Stress: No Stress Concern Present (04/29/2023)   Received from [facility] - Occupational Stress Questionnaire    Feeling of Stress :  Not at all  Social Connections: Moderately Integrated (07/08/2023)   Social Connection and Isolation Panel [NHANES]    Frequency of Communication with Friends and Family: Once a week    Frequency of Social Gatherings with Friends and Family: Once a week    Attends Religious Services: 1 to 4 times per year    Active Member of [employer] or Organizations: Yes    Attends Banker Meetings: Never    Marital Status: Married   Water engineer and Provision:  Detailed education and instructions provided on heart failure disease management including the following:  Signs and symptoms of Heart Failure When to call the physician Importance of daily  weights Low sodium diet Fluid restriction Medication management Anticipated future follow-up appointments  Patient education given on each of the above topics.  Patient acknowledges understanding via teach back method and acceptance of all instructions.  Education Materials:  "Living Better With Heart Failure" Booklet, HF zone tool, & Daily Weight Tracker Tool.  Patient has scale at home: Yes-weights 3 times a week Patient has pill box at home: No not use pill box.  He has a system with his medicine bottles that has worked but seems to be getting more difficult with his visual impairments now from Macular  Degeneration.    High Risk Criteria for Readmission and/or Poor Patient Outcomes: Heart failure hospital admissions (last 6 months): 1  No Show rate: 2 Difficult social situation: Visual Impairment  Demonstrates medication adherence: The best he can with his vision impairment Primary Language: English Literacy level: Reading, writing are difficult with his vision loss.  Barriers of Care:   Transportation to appointments  Considerations/Referrals:   Referral made to Heart Failure Pharmacist Stewardship: Yes Referral made to Heart Failure CSW/NCM TOC: No Referral made to Heart & Vascular TOC clinic: Lindalee Retort be scheduled once I speak with his wife Adell Hones or his Daughter-in-Law Burdette Carolin.     Items for Follow-up on DC/TOC: Diet & Fluid Restrictions Daily Weights Medication Administration due to visual impairments Continued Heart Failure Education  Celedonio Coil, RN, BSN [facility] Heart Failure Navigator Secure Chat Only

## 2023-07-12 ENCOUNTER — Inpatient Hospital Stay (HOSPITAL_COMMUNITY)

## 2023-07-12 ENCOUNTER — Other Ambulatory Visit (HOSPITAL_COMMUNITY)

## 2023-07-12 DIAGNOSIS — I5021 Acute systolic (congestive) heart failure: Secondary | ICD-10-CM

## 2023-07-12 LAB — BASIC METABOLIC PANEL WITH GFR
Anion gap: 9 (ref 5–15)
BUN: 17 mg/dL (ref 8–23)
CO2: 40 mmol/L — ABNORMAL HIGH (ref 22–32)
Calcium: 9.2 mg/dL (ref 8.9–10.3)
Chloride: 84 mmol/L — ABNORMAL LOW (ref 98–111)
Creatinine, Ser: 0.3 mg/dL — ABNORMAL LOW (ref 0.61–1.24)
GFR, Estimated: >60 mL/min (ref >60)
Glucose, Bld: 98 mg/dL (ref 70–99)
Potassium: 4.8 mmol/L (ref 3.5–5.1)
Sodium: 133 mmol/L — ABNORMAL LOW (ref 135–145)

## 2023-07-12 LAB — ECHOCARDIOGRAM LIMITED
Calc EF: 41.1 %
Height: 66 in
S' Lateral: 4.1 cm
Single Plane A2C EF: 44.5 %
Single Plane A4C EF: 35.6 %
Weight: 2529.12 [oz_av]

## 2023-07-12 LAB — BRAIN NATRIURETIC PEPTIDE: B Natriuretic Peptide: 729 pg/mL — ABNORMAL HIGH (ref 0.0–100.0)

## 2023-07-12 LAB — PREALBUMIN: Prealbumin: 8 mg/dL — ABNORMAL LOW (ref 18–38)

## 2023-07-12 MED ORDER — POTASSIUM CHLORIDE CRYS ER 20 MEQ PO TBCR
20.0000 meq | EXTENDED_RELEASE_TABLET | Freq: Every day | ORAL | Status: DC
  Administered 2023-07-13 – 2023-07-14 (×2): 20 meq via ORAL
  Filled 2023-07-12 (×2): qty 1

## 2023-07-12 MED ORDER — AMLODIPINE BESYLATE 5 MG PO TABS
5.0000 mg | ORAL_TABLET | Freq: Every day | ORAL | Status: DC
  Administered 2023-07-12 – 2023-07-14 (×3): 5 mg via ORAL
  Filled 2023-07-12 (×3): qty 1

## 2023-07-12 MED ORDER — METOPROLOL TARTRATE 25 MG PO TABS
25.0000 mg | ORAL_TABLET | Freq: Two times a day (BID) | ORAL | Status: DC
  Administered 2023-07-12 – 2023-07-13 (×2): 25 mg via ORAL
  Filled 2023-07-12 (×2): qty 1

## 2023-07-12 MED ORDER — FERROUS SULFATE 325 (65 FE) MG PO TABS
325.0000 mg | ORAL_TABLET | Freq: Every day | ORAL | Status: DC
  Administered 2023-07-12 – 2023-07-14 (×3): 325 mg via ORAL
  Filled 2023-07-12 (×3): qty 1

## 2023-07-12 MED ORDER — METHYLPREDNISOLONE SODIUM SUCC 40 MG IJ SOLR: 40.0000 mg | INTRAMUSCULAR | Status: DC

## 2023-07-12 MED ORDER — METHYLPREDNISOLONE SODIUM SUCC 40 MG IJ SOLR
40.0000 mg | INTRAMUSCULAR | Status: AC
Start: 2023-07-13 — End: 2023-07-14
  Administered 2023-07-13: 40 mg via INTRAVENOUS
  Filled 2023-07-12: qty 1

## 2023-07-12 MED ORDER — PREDNISONE 20 MG PO TABS
50.0000 mg | ORAL_TABLET | Freq: Every day | ORAL | Status: DC
  Administered 2023-07-13: 50 mg via ORAL
  Filled 2023-07-12: qty 1

## 2023-07-12 NOTE — Progress Notes (Signed)
 PROGRESS NOTE    Patient: Curtis Flores                            PCP: [name], PA-C                    DOB: [birth year]            DOA: 07/08/2023 [id number]             DOS: 07/12/2023, 10:06 AM   LOS: 4 days   Date of Service: The patient was seen and examined on 07/12/2023  Subjective:   The patient was seen and examined this morning reporting improved shortness of breath, improved lower extremity edema, still complaining of severe generalized weaknesses stating he is still weak that he needs assist with his clothing. Does not want to go to SNF   Brief Narrative:   KEVONTA SPREHE is a 68 y.o. male with medical history significant of PAF, MI, pulmonary fibrosis, HLD, HTN, Prostate Ca. Adenocarcinoma of the lung.    Presenting w/ several day worsening SOB. No fever, CP, N/V. Associated w/ LE swelling. Taking home lasix  w/o benefit. EMS called to evaluate pt and found to be hypoxic on home O2 of 2L [place].    ED Course: Workup as below. 40mg  IV lasix  x1 given.      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 W  Pulmonary fibrosis, decompensated CHF in conjunction w/ recent CAP and possible pulmonary adenocarcinoma.  -Baseline 2L [place]. Improved. >>>  Was up to 4 L of oxygen currently on 3 L satting 100% -DuoNeb bronchodilators, mucolytics,  - Acute decompensated CHF :  - Lasix   IV 80 mg twice a day-will be continued with a -Nebs as needed, mucolytics   - Continuing IV Solu-Medrol  -1 more dose and switch to p.o. prednisone  with taper   Acute on Chronic combined systolic and diastolic CHF.   -Continue to have shortness of breath, +2 pitting edema -Improving shortness of breath and edema - On Lasix  80 mg IV twice daily-diuresing well  - Monitoring I's and O's and daily weight  Echo from 03/2022 showing EF 35% and Grade 1 diastolic dysfunction.  Baseline Lasix  40mg  PO BID  - Strict I/O   -Cardiology following  - DC sotalol , due to ADHF.  Not a candidate for amiodarone  due to pulmonary fibrosis, not a candidate for flecainide due to CAD, - Holding ACE, ARB's due to aggressive diuresis on Lasix  for now   Intake/Output Summary (Last 24 hours) at 07/11/2023 1158 Last data filed at 07/11/2023 1106 Gross per 24 hour  Intake 240 ml  Output 2750 ml  Net -2510 ml   Continue IV Lasix  80 mg twice daily,  start Entresto 24-26 mg twice daily.     Limited echocardiogram scheduled for today-07/12/2023   Decubitus ulcers: Chronic.  - Wound care.    Prostate Ca / Adenocarcinoma Lung: known metastatic prostate cancer. 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  - No active mgt - defer to ongoing Oncology workup -Has a follow-up  appointment next week with Dr. Cheree Cords   Hyponatremia/hypochloremia Editha Goring - Nephrology curb sided -Replace albumin , -  discontinuing supplement sodium tablets - Urine sodium 125, 126 >>> 128 >> 133   HTN: - hold home Losartan  and Norvasc  as diuresing    PAF/CAD: currently in Afib. H/o cath/stent placement - Discontinue sotalol , continue Eliquis  + Plavix -per cardiology   Anemia: Normocytic. Chronic disease.  With iron deficiency POA hgb 10.3 on admission. Baseline 11.3.  -Iron level 30, TIBC 155, ferritin 512 - Monitor for now. Likely worse due to chronic illness w/ acute worsening recently.  - Initiating iron supplement     ----------------------------------------------------------------------------------------------------------------------------------------------- Nutritional status:  The patient's BMI is: Body  mass index is 25.51 kg/m. I agree with the assessment and plan as outlined   DVT prophylaxis:  apixaban  (ELIQUIS ) tablet 5 mg   Code Status:   Code Status: Full Code  Family Communication: -Advance care planning has been discussed.   Admission status:   Status is: Inpatient Remains inpatient appropriate because: Needing IV diuretics, respiratory support   Disposition: From  - home             Planning for discharge in 1-2 days: to   Procedures:   No admission procedures for hospital encounter.   Antimicrobials:  Anti-infectives (From admission, onward)    None        Medication:   amLODipine   5 mg Oral Daily   apixaban   5 mg Oral BID   atorvastatin   80 mg Oral q1800   azaTHIOprine   50 mg Oral Daily   clopidogrel   75 mg Oral Daily   feeding supplement  237 mL Oral BID BM   ferrous sulfate  325 mg Oral Q breakfast   furosemide   80 mg Intravenous BID   ipratropium  0.5 mg Nebulization TID   levalbuterol   1.25 mg Nebulization TID   liver oil-zinc  oxide   Topical TID   [START ON 07/13/2023] methylPREDNISolone  (SOLU-MEDROL ) injection  40 mg Intravenous Q24H   pantoprazole   40 mg Oral Daily   [START ON 07/13/2023] potassium chloride  SA  20 mEq Oral Daily   [START ON 07/13/2023] predniSONE   50 mg Oral Q breakfast   senna-docusate  1 tablet Oral QHS    HYDROmorphone  (DILAUDID ) injection, levalbuterol , oxyCODONE -acetaminophen    Objective:   Vitals:   07/12/23 0256 07/12/23 0651 07/12/23 0804 07/12/23 0852  BP: (!) 175/77   (!) 156/76  Pulse: 78     Resp: 19     Temp: 98.1 F (36.7 C)     TempSrc: Oral     SpO2: 100%  100%   Weight:  71.7 kg    Height:        Intake/Output Summary (Last 24 hours) at 07/12/2023 1006 Last data filed at 07/12/2023 0651 Gross per 24 hour  Intake 480 ml  Output 2125 ml  Net -1645 ml   Filed Weights   07/10/23 0445 07/11/23 0312 07/12/23 0651  Weight: 75.6 kg 72.8 kg 71.7 kg     Physical examination:          General:  AAO x 3,  cooperative, mild shortness of breath addressed  HEENT:  Normocephalic, PERRL, otherwise with in Normal limits   Neuro:  CNII-XII intact. , normal motor and sensation, reflexes intact   Lungs:   Clear to auscultation BL, Respirations unlabored,  No wheezes / crackles  Cardio:    S1/S2, RRR, No murmure, No Rubs or Gallops   Abdomen:  Soft, non-tender, bowel  sounds active all four quadrants, no guarding or peritoneal signs.  Muscular  skeletal:  Limited exam -global generalized weaknesses - in bed, able to move all 4 extremities,   2+ pulses,  symmetric, No pitting edema  Skin:  Dry, warm to touch, negative for any Rashes,  Wounds: Please see nursing documentation        ---------------------------------------------------------------------------------------------------------------------------------    LABs:     Latest Ref Rng & Units 07/09/2023    4:44 AM 07/08/2023    3:36 PM 06/18/2023    1:53 PM  CBC  WBC 4.0 - 10.5 K/uL 7.9  8.3  11.7   Hemoglobin 13.0 - 17.0 g/dL 9.9  16.1  09.6   Hematocrit 39.0 - 52.0 % 31.0  32.1  38.5   Platelets 150 - 400 K/uL 344  363  276       Latest Ref Rng & Units 07/12/2023    4:41 AM 07/11/2023    2:53 AM 07/10/2023    4:37 AM  CMP  Glucose 70 - 99 mg/dL 98  045  409   BUN 8 - 23 mg/dL 17  12  16    Creatinine 0.61 - 1.24 mg/dL 8.11  <9.14  7.82   Sodium 135 - 145 mmol/L 133  133  128   Potassium 3.5 - 5.1 mmol/L 4.8  4.8  5.1   Chloride 98 - 111 mmol/L 84  85  87   CO2 22 - 32 mmol/L 40  38  32   Calcium  8.9 - 10.3 mg/dL 9.2  9.3  8.7        Micro Results No results found for this or any previous visit (from the past 240 hours).  Radiology Reports No results found.   SIGNED: Bobbetta Burnet, MD, FHM. FAAFP. Arlin Benes - [facility] hospitalist Time spent - 55 min.  In seeing, evaluating and examining the patient. Reviewing medical records, labs, drawn plan of care. [facility],  Pager (please use [email or web address] to  page/ text) Please use Epic Secure Chat for non-urgent communication (7AM-7PM)  If 7PM-7AM, please contact night-coverage [email or web address], 07/12/2023, 10:06 AM

## 2023-07-12 NOTE — Progress Notes (Addendum)
 Patient has been in atrial fibrillation with RVR, sotalol  was discontinued on admission.  His LVEF is 35 to 40%.  Will add low dose metoprolol  and follow response.  Continue telemetry monitoring

## 2023-07-12 NOTE — TOC CM/SW Note (Signed)
 CM met with patient at bedside to answer question regarding discharge plan per patient request. CM explained to patient that he will go home with [facility]. CM explained [facility] will provide a PT OT and SN.

## 2023-07-12 NOTE — Plan of Care (Signed)

## 2023-07-12 NOTE — Progress Notes (Signed)
  Echocardiogram 2D Echocardiogram has been performed.  Esthela Brandner L Susane Bey RDCS 07/12/2023, 1:27 PM

## 2023-07-13 DIAGNOSIS — I5021 Acute systolic (congestive) heart failure: Secondary | ICD-10-CM | POA: Diagnosis not present

## 2023-07-13 LAB — BASIC METABOLIC PANEL WITH GFR
Anion gap: 13 (ref 5–15)
BUN: 19 mg/dL (ref 8–23)
CO2: 38 mmol/L — ABNORMAL HIGH (ref 22–32)
Calcium: 9.3 mg/dL (ref 8.9–10.3)
Chloride: 82 mmol/L — ABNORMAL LOW (ref 98–111)
Creatinine, Ser: 0.4 mg/dL — ABNORMAL LOW (ref 0.61–1.24)
GFR, Estimated: >60 mL/min (ref >60)
Glucose, Bld: 70 mg/dL (ref 70–99)
Potassium: 4.1 mmol/L (ref 3.5–5.1)
Sodium: 133 mmol/L — ABNORMAL LOW (ref 135–145)

## 2023-07-13 LAB — MAGNESIUM: Magnesium: 2.1 mg/dL (ref 1.7–2.4)

## 2023-07-13 LAB — BRAIN NATRIURETIC PEPTIDE: B Natriuretic Peptide: 707 pg/mL — ABNORMAL HIGH (ref 0.0–100.0)

## 2023-07-13 MED ORDER — METOPROLOL TARTRATE 50 MG PO TABS
50.0000 mg | ORAL_TABLET | Freq: Two times a day (BID) | ORAL | Status: DC
  Administered 2023-07-13: 25 mg via ORAL
  Administered 2023-07-13 – 2023-07-14 (×2): 50 mg via ORAL
  Filled 2023-07-13 (×3): qty 1

## 2023-07-13 MED ORDER — ALPRAZOLAM 0.5 MG PO TABS: 0.5000 mg | ORAL_TABLET | Freq: Three times a day (TID) | ORAL | Status: DC | PRN

## 2023-07-13 MED ORDER — PREDNISONE 20 MG PO TABS
40.0000 mg | ORAL_TABLET | Freq: Every day | ORAL | Status: DC
  Administered 2023-07-14: 40 mg via ORAL
  Filled 2023-07-13: qty 2

## 2023-07-13 MED ORDER — TORSEMIDE 20 MG PO TABS
40.0000 mg | ORAL_TABLET | Freq: Every day | ORAL | Status: DC
  Administered 2023-07-13 – 2023-07-14 (×2): 40 mg via ORAL
  Filled 2023-07-13 (×2): qty 2

## 2023-07-13 MED ORDER — MAGNESIUM SULFATE 2 GM/50ML IV SOLN
2.0000 g | Freq: Once | INTRAVENOUS | Status: AC
  Administered 2023-07-13: 2 g via INTRAVENOUS
  Filled 2023-07-13: qty 50

## 2023-07-13 NOTE — Plan of Care (Signed)
   Problem: Clinical Measurements: Goal: Ability to maintain clinical measurements within normal limits will improve Outcome: Progressing

## 2023-07-13 NOTE — Plan of Care (Signed)
   Problem: Education: Goal: Knowledge of General Education information will improve Description Including pain rating scale, medication(s)/side effects and non-pharmacologic comfort measures Outcome: Progressing   Problem: Health Behavior/Discharge Planning: Goal: Ability to manage health-related needs will improve Outcome: Progressing

## 2023-07-13 NOTE — Progress Notes (Signed)
   07/13/23 0849  Vitals  BP (!) 146/106  MAP (mmHg) 120  Pulse Rate (!) 103  MEWS COLOR  MEWS Score Color Green  Oxygen Therapy  SpO2 98 %  O2 Device Nasal Cannula  O2 Flow Rate (L/min) 3 L/min  MEWS Score  MEWS Temp 0  MEWS Systolic 0  MEWS Pulse 1  MEWS RR 0  MEWS LOC 0  MEWS Score 1   Tele called stating patient was having an irregular rhythm and runs of vtach. MD Shahmehdi notified.

## 2023-07-13 NOTE — Progress Notes (Signed)
 PROGRESS NOTE    Patient: Curtis Flores                            PCP: [name], PA-C                    DOB: 1955-10-07            DOA: 07/08/2023 WUJ:[id number]             DOS: 07/13/2023, 11:28 AM   LOS: 5 days   Date of Service: The patient was seen and examined on 07/13/2023  Subjective:   Patient was seen and examined this morning, stable no acute distress overnight patient had an episode of A-fib with RVR, started on metoprolol  (overnight heart rate as high as 126)  This morning awake alert oriented no acute distress heart rate 91   Brief Narrative:   Curtis Flores is a 68 y.o. male with medical history significant of PAF, MI, pulmonary fibrosis, HLD, HTN, Prostate Ca. Adenocarcinoma of the lung.    Presenting w/ several day worsening SOB. No fever, CP, N/V. Associated w/ LE swelling. Taking home lasix  w/o benefit. EMS called to evaluate pt and found to be hypoxic on home O2 of 2L  [place].    ED Course: Workup as below. 40mg  IV lasix  x1 given.      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 W  Pulmonary fibrosis, decompensated CHF in conjunction w/ recent CAP and possible pulmonary adenocarcinoma.  -Stable this morning, satting 98% on 2 L oxygen -Baseline 2L [place]. Improved. >>>  Was up to 4 L of oxygen   -Continue DuoNeb bronchodilators, mucolytics,  - Acute decompensated CHF :  - Lasix   IV 80 mg twice a day-diuresed well, switching to p.o. torsemide 40 mg daily -Nebs as needed, mucolytics   - Continuing IV Solu-Medrol  - switching  to p.o. prednisone  with taper   Acute on Chronic  combined systolic and diastolic CHF.  -Continue to have shortness of breath, +2 pitting edema -Improving shortness of breath and edema - On Lasix  80 mg IV twice daily-diuresing well >>> switching to torsemide 40 mg daily - Monitoring I's and O's and daily weight  Echo from 03/2022 showing EF 35% and Grade 1 diastolic dysfunction.  Baseline Lasix  40mg  PO BID  - Strict I/O   -Cardiology following  - DC sotalol , due to ADHF.  Not a candidate for amiodarone  due to pulmonary fibrosis, not a candidate for flecainide due to CAD, - Holding ACE, ARB's due to aggressive diuresis on Lasix  for now   Intake/Output Summary (Last 24 hours) at 07/11/2023 1158 Last data filed at 07/11/2023 1106 Gross per 24 hour  Intake 240 ml  Output 2750 ml  Net -2510 ml   Continue IV Lasix  80 mg twice daily,  start Entresto 24-26 mg twice daily.     Limited echocardiogram scheduled for today-07/12/2023   Decubitus ulcers: Chronic.  - Wound care.    Prostate Ca / Adenocarcinoma Lung: known metastatic prostate cancer. 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  -  No active mgt - defer to ongoing Oncology workup -Has a follow-up appointment next week with Dr. Cheree Cords   Hyponatremia/hypochloremia /hypoalbuminemia/hypocalcemia - Nephrology curb sided -Replace albumin , -  discontinuing supplement sodium tablets - Urine sodium 125, 126 >>> 128 >> 133  - Replete electrolytes accordingly  HTN: - hold home Losartan  and Norvasc  as diuresing    PAF/CAD-A-fib with RVR overnight  h/o cath/stent placement - Discontinue sotalol , continue Eliquis  + Plavix -per cardiology -Initiating metoprolol  for rate control - responding well   Anemia: Normocytic. Chronic disease.  With iron deficiency POA hgb 10.3 on admission. Baseline 11.3.  -Iron level 30, TIBC 155, ferritin 512 - Monitor for now. Likely worse due to chronic illness w/ acute worsening recently.  - Initiating iron  supplement     ----------------------------------------------------------------------------------------------------------------------------------------------- Nutritional status:  The patient's BMI is: Body mass index is 25.3 kg/m. I agree with the assessment and plan as outlined   DVT prophylaxis:  apixaban  (ELIQUIS ) tablet 5 mg   Code Status:   Code Status: Full Code  Family Communication: -Advance care planning has been discussed.   Admission status:   Status is: Inpatient Remains inpatient appropriate because: Needing IV diuretics, respiratory support   Disposition: From  - home             Planning for discharge in 1-2 days: to   Procedures:   No admission procedures for hospital encounter.   Antimicrobials:  Anti-infectives (From admission, onward)    None        Medication:   amLODipine   5 mg Oral Daily   apixaban   5 mg Oral BID   atorvastatin   80 mg Oral q1800   azaTHIOprine   50 mg Oral Daily   clopidogrel   75 mg Oral Daily   feeding supplement  237 mL Oral BID BM   ferrous sulfate  325 mg Oral Q breakfast   ipratropium  0.5 mg Nebulization TID   levalbuterol   1.25 mg Nebulization TID   liver oil-zinc  oxide   Topical TID   metoprolol  tartrate  50 mg Oral BID   pantoprazole   40 mg Oral Daily   potassium chloride  SA  20 mEq Oral Daily   predniSONE   50 mg Oral Q breakfast   senna-docusate  1 tablet Oral QHS   torsemide  40 mg Oral Daily    ALPRAZolam, HYDROmorphone  (DILAUDID ) injection, levalbuterol , oxyCODONE -acetaminophen    Objective:   Vitals:   07/13/23 0520 07/13/23 0752 07/13/23 0849 07/13/23 1006  BP: 128/68  (!) 146/106 132/65  Pulse: 97  (!) 103 91  Resp: 20     Temp: (!) 97.5 F (36.4 C)     TempSrc: Oral     SpO2: 100% 99% 98%   Weight: 71.1 kg     Height:        Intake/Output Summary (Last 24 hours) at 07/13/2023 1128 Last data filed at 07/13/2023 1121 Gross per 24 hour  Intake 600 ml  Output 2250 ml  Net -1650 ml    Filed Weights   07/11/23 0312 07/12/23 0651 07/13/23 0520  Weight: 72.8 kg 71.7 kg 71.1 kg     Physical examination:         General:  AAO x 3,  cooperative, no distress;   HEENT:  Poor vision Normocephalic, PERRL, otherwise with in Normal limits   Neuro:  CNII-XII intact. , normal motor and sensation, reflexes intact   Lungs:   Clear to auscultation BL, Respirations unlabored,  No wheezes / crackles  Cardio:  Irregularly irregular, with systolic murmure, No Rubs or Gallops   Abdomen:  Soft, non-tender, bowel sounds active all four quadrants, no guarding or peritoneal signs.  Muscular  skeletal:  Limited exam -global generalized weaknesses - in bed, able to move all 4 extremities,   2+ pulses,  symmetric, No pitting edema  Skin:  Dry, warm to touch, negative for any Rashes,  Wounds: Please see nursing documentation            ---------------------------------------------------------------------------------------------------------------------------------    LABs:     Latest Ref Rng & Units 07/09/2023    4:44 AM 07/08/2023    3:36 PM 06/18/2023    1:53 PM  CBC  WBC 4.0 - 10.5 K/uL 7.9  8.3  11.7   Hemoglobin 13.0 - 17.0 g/dL 9.9  44.0  10.2   Hematocrit 39.0 - 52.0 % 31.0  32.1  38.5   Platelets 150 - 400 K/uL 344  363  276       Latest Ref Rng & Units 07/13/2023    5:10 AM 07/12/2023    4:41 AM 07/11/2023    2:53 AM  CMP  Glucose 70 - 99 mg/dL 70  98  725   BUN 8 - 23 mg/dL 19  17  12    Creatinine 0.61 - 1.24 mg/dL 3.66  4.40  <3.47   Sodium 135 - 145 mmol/L 133  133  133   Potassium 3.5 - 5.1 mmol/L 4.1  4.8  4.8   Chloride 98 - 111 mmol/L 82  84  85   CO2 22 - 32 mmol/L 38  40  38   Calcium  8.9 - 10.3 mg/dL 9.3  9.2  9.3        Micro Results No results found for this or any previous visit (from the past 240 hours).  Radiology Reports ECHOCARDIOGRAM LIMITED Result Date: 07/12/2023    ECHOCARDIOGRAM LIMITED REPORT   Patient Name:   ALANA DELO [facility], [place]  Date of Exam: 07/12/2023 Medical Rec #:  [id number]      Height:       66.0 in Accession #:    [id number]     Weight:       158.1 lb Date of Birth:  [birth year]       BSA:          1.810 m Patient Age:    68 years       BP:           156/76 mmHg Patient Gender: M              HR:           82 bpm. Exam [facility] Procedure: Limited Echo, Limited Color Doppler and Cardiac Doppler (Both            Spectral and Color Flow Doppler were utilized during procedure). Indications:    CHF  History:        Patient has prior history of Echocardiogram examinations, most                 recent 01/14/2023. CHF, CAD, Signs/Symptoms:Chest Pain; Risk                 Factors:Hypertension and Dyslipidemia.  Sonographer:    Juanita Shaw Referring Phys: [id number] SCOTESIA Y DUNLAP IMPRESSIONS  1. Left ventricular ejection fraction, by estimation, is 35 to 40%. The left ventricle has moderately decreased function.  2. Right ventricular systolic function is normal. The right ventricular size  is normal. There is moderately elevated pulmonary artery systolic pressure.  3. A small pericardial effusion is present. The pericardial effusion is surrounding the apex. Large pleural effusion in both left and right lateral regions.  4. No evidence of mitral valve regurgitation.  5. The aortic valve was not well visualized. Aortic valve regurgitation is not visualized.  6. The inferior vena cava is normal in size with greater than 50% respiratory variability, suggesting right atrial pressure of 3 mmHg. Comparison(s): Prior images reviewed side by side. Large-bilateral pleural effusions are new. FINDINGS  Left Ventricle: Left ventricular ejection fraction, by estimation, is 35 to 40%. The left ventricle has moderately decreased function. The left ventricular internal cavity size was normal in size. Right Ventricle: The right ventricular size is normal. No increase in right ventricular wall thickness. Right ventricular systolic function is normal.  There is moderately elevated pulmonary artery systolic pressure. The tricuspid regurgitant velocity is 3.50 m/s, and with an assumed right atrial pressure of 3 mmHg, the estimated right ventricular systolic pressure is 52.0 mmHg. Pericardium: A small pericardial effusion is present. The pericardial effusion is surrounding the apex. Tricuspid Valve: The tricuspid valve is normal in structure. Tricuspid valve regurgitation is not demonstrated. No evidence of tricuspid stenosis. Aortic Valve: The aortic valve was not well visualized. Aortic valve regurgitation is not visualized. Venous: The inferior vena cava is normal in size with greater than 50% respiratory variability, suggesting right atrial pressure of 3 mmHg. Additional Comments: There is a large pleural effusion in both left and right lateral regions.  LEFT VENTRICLE PLAX 2D LVIDd:         5.20 cm LVIDs:         4.10 cm LV PW:         1.10 cm LV IVS:        0.90 cm  LV Volumes (MOD) LV vol d, MOD A2C: 133.0 ml LV vol d, MOD A4C: 120.0 ml LV vol s, MOD A2C: 73.8 ml LV vol s, MOD A4C: 77.3 ml LV SV MOD A2C:     59.2 ml LV SV MOD A4C:     120.0 ml LV SV MOD BP:      52.9 ml RIGHT VENTRICLE            IVC RV S prime:     9.46 cm/s  IVC diam: 1.30 cm TAPSE (M-mode): 0.7 cm TRICUSPID VALVE TR Peak grad:   49.0 mmHg TR Vmax:        350.00 cm/s Gloriann Larger MD Electronically signed by Gloriann Larger MD Signature Date/Time: 07/12/2023/1:35:39 PM    Final      SIGNED: Bobbetta Burnet, MD, FHM. FAAFP. Arlin Benes - [facility] Time spent - 55 min.  In seeing, evaluating and examining the patient. Reviewing medical records, labs, drawn plan of care. [facility],  Pager (please use [email or web address] to page/ text) Please use Epic Secure Chat for non-urgent communication (7AM-7PM)  If 7PM-7AM, please contact night-coverage [email or web address], 07/13/2023, 11:28 AM

## 2023-07-14 DIAGNOSIS — I5021 Acute systolic (congestive) heart failure: Secondary | ICD-10-CM | POA: Diagnosis not present

## 2023-07-14 DIAGNOSIS — I5023 Acute on chronic systolic (congestive) heart failure: Secondary | ICD-10-CM | POA: Diagnosis not present

## 2023-07-14 LAB — BASIC METABOLIC PANEL WITH GFR
Anion gap: 10 (ref 5–15)
BUN: 19 mg/dL (ref 8–23)
CO2: 45 mmol/L — ABNORMAL HIGH (ref 22–32)
Calcium: 9.2 mg/dL (ref 8.9–10.3)
Chloride: 81 mmol/L — ABNORMAL LOW (ref 98–111)
Creatinine, Ser: 0.44 mg/dL — ABNORMAL LOW (ref 0.61–1.24)
GFR, Estimated: >60 mL/min (ref >60)
Glucose, Bld: 83 mg/dL (ref 70–99)
Potassium: 4.1 mmol/L (ref 3.5–5.1)
Sodium: 136 mmol/L (ref 135–145)

## 2023-07-14 LAB — BRAIN NATRIURETIC PEPTIDE: B Natriuretic Peptide: 719 pg/mL — ABNORMAL HIGH (ref 0.0–100.0)

## 2023-07-14 LAB — SOLUBLE TRANSFERRIN RECEPTOR: Transferrin Receptor: 29.9 nmol/L — ABNORMAL HIGH (ref 12.2–27.3)

## 2023-07-14 MED ORDER — IPRATROPIUM BROMIDE 0.02 % IN SOLN: 0.5000 mg | Freq: Two times a day (BID) | RESPIRATORY_TRACT | Status: DC

## 2023-07-14 MED ORDER — METHYLPREDNISOLONE 4 MG PO TBPK: ORAL_TABLET | ORAL | 0 refills | Status: DC

## 2023-07-14 MED ORDER — TORSEMIDE 40 MG PO TABS: 40.0000 mg | ORAL_TABLET | Freq: Every day | ORAL | 1 refills | Status: DC

## 2023-07-14 MED ORDER — FERROUS SULFATE 325 (65 FE) MG PO TABS: 325.0000 mg | ORAL_TABLET | Freq: Every day | ORAL | 3 refills | Status: DC

## 2023-07-14 MED ORDER — LOSARTAN POTASSIUM 25 MG PO TABS
25.0000 mg | ORAL_TABLET | Freq: Every day | ORAL | Status: DC
  Administered 2023-07-14: 25 mg via ORAL
  Filled 2023-07-14: qty 1

## 2023-07-14 MED ORDER — LEVALBUTEROL HCL 1.25 MG/0.5ML IN NEBU: 1.2500 mg | INHALATION_SOLUTION | Freq: Two times a day (BID) | RESPIRATORY_TRACT | Status: DC

## 2023-07-14 MED ORDER — LOSARTAN POTASSIUM 25 MG PO TABS: 25.0000 mg | ORAL_TABLET | Freq: Every day | ORAL | 1 refills | Status: DC

## 2023-07-14 MED ORDER — METOPROLOL SUCCINATE ER 25 MG PO TB24: 50.0000 mg | ORAL_TABLET | Freq: Two times a day (BID) | ORAL | Status: DC

## 2023-07-14 MED ORDER — METOPROLOL SUCCINATE ER 50 MG PO TB24: 50.0000 mg | ORAL_TABLET | Freq: Two times a day (BID) | ORAL | 1 refills | Status: DC

## 2023-07-14 NOTE — Progress Notes (Signed)
 Rounding Note    Patient Name: Curtis Flores Date of Encounter: 07/14/2023  [facility] Cardiologist: [name], MD   Subjective   SOB improving.   Inpatient Medications    Scheduled Meds:  amLODipine   5 mg Oral Daily   apixaban   5 mg Oral BID   atorvastatin   80 mg Oral q1800   azaTHIOprine   50 mg Oral Daily   clopidogrel   75 mg Oral Daily   feeding supplement  237 mL Oral BID BM   ferrous sulfate  325 mg Oral Q breakfast   ipratropium  0.5 mg Nebulization TID   levalbuterol   1.25 mg Nebulization TID   liver oil-zinc  oxide   Topical TID   metoprolol  tartrate  50 mg Oral BID   pantoprazole   40 mg Oral Daily   potassium chloride  SA  20 mEq Oral Daily   predniSONE   40 mg Oral Q breakfast   senna-docusate  1 tablet Oral QHS   torsemide  40 mg Oral Daily   Continuous Infusions:  PRN Meds: ALPRAZolam, HYDROmorphone  (DILAUDID ) injection, levalbuterol , oxyCODONE -acetaminophen    Vital Signs    Vitals:   07/13/23 2205 07/14/23 0621 07/14/23 0717 07/14/23 0847  BP: 106/70 136/85  120/79  Pulse: 98 95  (!) 104  Resp: 20 20    Temp: 97.8 F (36.6 C) (!) 97.5 F (36.4 C)    TempSrc: Oral Oral    SpO2: 100% 99% 98% 100%  Weight:  70.4 kg    Height:        Intake/Output Summary (Last 24 hours) at 07/14/2023 0914 Last data filed at 07/13/2023 1654 Gross per 24 hour  Intake 120 ml  Output 1775 ml  Net -1655 ml      07/14/2023    6:21 AM 07/13/2023    5:20 AM 07/12/2023    6:51 AM  Last 3 Weights  Weight (lbs) 155 lb 3.3 oz 156 lb 12 oz 158 lb 1.1 oz  Weight (kg) 70.4 kg 71.1 kg 71.7 kg      Telemetry    Rate controlled afib - Personally Reviewed  ECG    N/a - Personally Reviewed  Physical Exam   GEN: No acute distress.   Neck: No JVD Cardiac: irreg Respiratory: Clear to auscultation bilaterally. GI: Soft, nontender, non-distended  MS: No edema; No deformity. Neuro:  Nonfocal  Psych: Normal affect   Labs    High Sensitivity  Troponin:   Recent Labs  Lab 07/08/23 1536 07/08/23 1806  TROPONINIHS 10 9     Chemistry Recent Labs  Lab 07/08/23 1806 07/09/23 0444 07/09/23 1842 07/10/23 0437 07/12/23 0441 07/13/23 0510 07/14/23 0442  NA  --    < > 125*   < > 133* 133* 136  K  --    < > 4.8   < > 4.8 4.1 4.1  CL  --    < > 83*   < > 84* 82* 81*  CO2  --    < > 33*   < > 40* 38* 45*  GLUCOSE  --    < > 162*   < > 98 70 83  BUN  --    < > 19   < > 17 19 19   CREATININE  --    < > 0.49*   < > 0.30* 0.40* 0.44*  CALCIUM   --    < > 8.5*   < > 9.2 9.3 9.2  MG 1.9  --   --   --   --  2.1  --   PROT  --   --  6.3*  --   --   --   --   ALBUMIN   --   --  2.4*  --   --   --   --   AST  --   --  47*  --   --   --   --   ALT  --   --  45*  --   --   --   --   ALKPHOS  --   --  183*  --   --   --   --   BILITOT  --   --  0.5  --   --   --   --   GFRNONAA  --    < > >60   < > >60 >60 >60  ANIONGAP  --    < > 9   < > 9 13 10    < > = values in this interval not displayed.    Lipids No results for input(s): "CHOL", "TRIG", "HDL", "LABVLDL", "LDLCALC", "CHOLHDL" in the last 168 hours.  Hematology Recent Labs  Lab 07/08/23 1536 07/09/23 0444  WBC 8.3 7.9  RBC 3.58* 3.44*  HGB 10.3* 9.9*  HCT 32.1* 31.0*  MCV 89.7 90.1  MCH 28.8 28.8  MCHC 32.1 31.9  RDW 16.4* 16.4*  PLT 363 344   Thyroid No results for input(s): "TSH", "FREET4" in the last 168 hours.  BNP Recent Labs  Lab 07/12/23 0441 07/13/23 0510 07/14/23 0442  BNP 729.0* 707.0* 719.0*    DDimer No results for input(s): "DDIMER" in the last 168 hours.   Radiology    ECHOCARDIOGRAM LIMITED Result Date: 07/12/2023    ECHOCARDIOGRAM LIMITED REPORT   Patient Name:   Curtis Flores [facility] Date of Exam: 07/12/2023 Medical Rec #:  [id number]      Height:       66.0 in Accession #:    [id number]     Weight:       158.1 lb Date of Birth:  1956/02/20       BSA:          1.810 m Patient Age:    68 years       BP:           156/76 mmHg Patient Gender: M              HR:            82 bpm. Exam [facility] Procedure: Limited Echo, Limited Color Doppler and Cardiac Doppler (Both            Spectral and Color Flow Doppler were utilized during procedure). Indications:    CHF  History:        Patient has prior history of Echocardiogram examinations, most                 recent 01/14/2023. CHF, CAD, Signs/Symptoms:Chest Pain; Risk                 Factors:Hypertension and Dyslipidemia.  Sonographer:    Juanita Shaw Referring Phys: [id number] SCOTESIA Y DUNLAP IMPRESSIONS  1. Left ventricular ejection fraction, by estimation, is 35 to 40%. The left ventricle has moderately decreased function.  2. Right ventricular systolic function is normal. The right ventricular size is normal. There is moderately elevated pulmonary artery systolic pressure.  3. A small pericardial effusion is present. The pericardial effusion is surrounding the  apex. Large pleural effusion in both left and right lateral regions.  4. No evidence of mitral valve regurgitation.  5. The aortic valve was not well visualized. Aortic valve regurgitation is not visualized.  6. The inferior vena cava is normal in size with greater than 50% respiratory variability, suggesting right atrial pressure of 3 mmHg. Comparison(s): Prior images reviewed side by side. Large-bilateral pleural effusions are new. FINDINGS  Left Ventricle: Left ventricular ejection fraction, by estimation, is 35 to 40%. The left ventricle has moderately decreased function. The left ventricular internal cavity size was normal in size. Right Ventricle: The right ventricular size is normal. No increase in right ventricular wall thickness. Right ventricular systolic function is normal. There is moderately elevated pulmonary artery systolic pressure. The tricuspid regurgitant velocity is 3.50 m/s, and with an assumed right atrial pressure of 3 mmHg, the estimated right ventricular systolic pressure is 52.0 mmHg. Pericardium: A small pericardial effusion is  present. The pericardial effusion is surrounding the apex. Tricuspid Valve: The tricuspid valve is normal in structure. Tricuspid valve regurgitation is not demonstrated. No evidence of tricuspid stenosis. Aortic Valve: The aortic valve was not well visualized. Aortic valve regurgitation is not visualized. Venous: The inferior vena cava is normal in size with greater than 50% respiratory variability, suggesting right atrial pressure of 3 mmHg. Additional Comments: There is a large pleural effusion in both left and right lateral regions.  LEFT VENTRICLE PLAX 2D LVIDd:         5.20 cm LVIDs:         4.10 cm LV PW:         1.10 cm LV IVS:        0.90 cm  LV Volumes (MOD) LV vol d, MOD A2C: 133.0 ml LV vol d, MOD A4C: 120.0 ml LV vol s, MOD A2C: 73.8 ml LV vol s, MOD A4C: 77.3 ml LV SV MOD A2C:     59.2 ml LV SV MOD A4C:     120.0 ml LV SV MOD BP:      52.9 ml RIGHT VENTRICLE            IVC RV S prime:     9.46 cm/s  IVC diam: 1.30 cm TAPSE (M-mode): 0.7 cm TRICUSPID VALVE TR Peak grad:   49.0 mmHg TR Vmax:        350.00 cm/s Gloriann Larger MD Electronically signed by Gloriann Larger MD Signature Date/Time: 07/12/2023/1:35:39 PM    Final     Cardiac Studies    Patient Profile     Assessment & Plan    1.Acute on chronic HFrEF - 01/2023 echo: LVE 35-40%, grade I dd, normal RV - 07/2023 echo: LVE 35-40%, normal RV - CXR persistent pulmonary infiltrates, BNP 324  -I/Os incomplete yesterday, roughly negative 8.1 L since admission. If weights accurate 162-->155 this admission. Started on oral toresmide 40mg  daily yesterday, renal function is stable.   - medical therapy with lopressor  50mg  bid. Home losartan  held on admission - will defer his long term regimen to his primary cardiologist Dr [name], defer considerations to ARNI/MRA/SGLT2i. Consolidate lopressor  to toprol , resume his home losartan  100mg  daily but lower dose of 25mg  daily due to some intermittent soft bp's.  - appears euvolemic,  continue oral torsemide   2. CAD - had inferolateral STEMI in 2016, s/p T stenting of proximal LCx and OM1, s/p [place] arthrectomy of T stent ISR in 2020. He has residual eccentric 50 to 70% stenosis of proximal LAD,  70 to 75% apical narrowing and eccentric 90% stenosis of D1 as well as 60 to 70% mid RCA stenosis after acute marginal [place] Flath 60% narrowing. RCA is nondominant  - On Plavix  indefinitely with eliquis  given extensive CAD history   3. Paroxysmal aflutter/afib - TEE/DCCV in 2017 - flutter ablation in 2017, has been on sotalol  since - sotalol  stopped this admission based on notes in setting of decompensated heart failure and also treatment failure with recurrent afib.  - poor amio candidate due to pulmonary fibrosis. Can follow over time, perhals f/u with EP if needed to consider possibly dofetilide if rate control fails.  - focus on rate contorl at this time. Rates 80s this AM with initiation of lopressor  and titration to 50mg  bid. Transition to toprol  50mg  bid.  - on eliquis  for stroke prevention  4. Pulmonary fibrosis - on home O2 2L [place]  5. History of mestatatic prostate cancer - notes mention current concerns for possible pulmonary adenocarcinoma - has outpatient f/u with oncology  Ok for discharge from cardiac standpoint, we will sign off inpatient care at this time    For questions or updates, please contact [facility] Please consult [email or web address] for contact info under        Signed, Armida Lander, MD  07/14/2023, 9:14 AM

## 2023-07-14 NOTE — Discharge Summary (Signed)
 Physician Discharge Summary   Patient: Curtis Flores MRN: [id number] DOB: [birth year]  Admit date:     07/08/2023  Discharge date: 07/14/23  Discharge Physician: Bobbetta Burnet   PCP: [name], PA-C   Recommendations at discharge:    Follow-up with primary cardiologist Dr [name], defer considerations to ARNI/MRA/SGLT2i. (Sotalol  discontinued, losartan  reduced from 100mg  -- to 25 mg daily due to some intermittent soft blood pressure), Lasix  switched to torsemide 40 mg daily Continue daily weight, Follow-up with heart failure team as outpatient Follow-up with palliative care as outpatient Follow-up with the PCP in 1 week   Discharge Diagnoses: Principal Problem:   Acute systolic (congestive) heart failure (HCC) Active Problems:   Pulmonary fibrosis (HCC)   Rheumatoid arthritis (HCC)   CAD S/P percutaneous coronary angioplasty   PAF (paroxysmal atrial fibrillation) (HCC)   Essential hypertension   Anemia   Hyponatremia   Metastatic adenocarcinoma to bone (HCC)   Adenocarcinoma (HCC)   Decubitus skin ulcer  Resolved Problems:   * No resolved hospital problems. *  Hospital Course: Curtis Flores is a 68 y.o. male with medical history significant of PAF, MI, pulmonary fibrosis, HLD, HTN, Prostate Ca. Adenocarcinoma of the lung.    Presenting w/ several day worsening SOB. No fever, CP, N/V. Associated w/ LE swelling. Taking home lasix  w/o benefit. EMS called to evaluate pt and found to be hypoxic on home O2 of 2L [place].    ED Course: Workup as below. 40mg  IV lasix  x1 given.    Acute respiratory failure. W  Pulmonary fibrosis, decompensated CHF in conjunction w/ recent CAP and possible pulmonary adenocarcinoma.   -Stable this morning, satting 100 % on 2 L oxygen -Baseline 2L [place]. Improved. Was up to 4 L of oxygen on this admission   -Continue DuoNeb bronchodilators, mucolytics,   - Acute decompensated CHF :  - Lasix   IV 80 mg twice a day-diuresed well, switching to p.o.  Torsemide 40 mg daily -Losartan  reduced from 125 mg p.o. daily -Nebs as needed, mucolytics   - Continuing IV Solu-Medrol  - switching  to p.o. Prednisone  with taper   Acute on Chronic combined systolic and diastolic CHF.  -Continue to have shortness of breath, +2 pitting edema -Improving shortness of breath and edema  - 01/2023 echo: LVE 35-40%, grade I dd, normal RV - 07/2023 echo: LVE 35-40%, normal RV - CXR persistent pulmonary infiltrates, BNP 324   -I/Os: roughly negative 8.1 L since admission.  Weight:162--> 155 this admission.  Started on oral toresmide 40mg  daily.  Renal function is stable.    - On Lasix  80 mg IV  BID >>> switching to torsemide 40 mg daily   -Cardiology following Dr. Emilia Harbour to follow-up with primary cardiologist Dr. Dean Every  defer considerations to ARNI/MRA/SGLT2i.   - DC sotalol , due to ADHF.   Not a candidate for amiodarone  due to pulmonary fibrosis, not a candidate for flecainide due to CAD, - Holding ACE, ARB's per cardiology       Decubitus ulcers: Chronic.  - Wound care.    Prostate Ca / Adenocarcinoma Lung: known metastatic prostate cancer. 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  - No active mgt - defer to ongoing Oncology workup -Has a follow-up appointment next week with Dr. Cheree Cords   Hyponatremia/hypochloremia /hypoalbuminemia/hypocalcemia - Nephrology curb sided -Replace albumin , -  discontinuing supplement sodium tablets - Urine sodium 125, 126 >>> 128 >> 133,136 - Replete electrolytes accordingly   HTN: - Resume Losartan  at 25 mg daily +  Norvasc  at 5 mg daily    PAF/CAD-A-fib with RVR overnight  h/o cath/stent placement - Discontinue sotalol , continue Eliquis  + Plavix -per cardiology -Initiating metoprolol  for rate control - responding well   Anemia: Normocytic. Chronic disease.  With iron deficiency POA hgb 10.3 on admission. Baseline 11.3.  -Iron level 30, TIBC 155, ferritin 512 - Monitor for  now. Likely worse due to chronic illness w/ acute worsening recently.  - Initiating iron supplement    Consultants: Cardiology Procedures performed: 2D echocardiogram Disposition: Home health Diet recommendation:  Discharge Diet Orders (From admission, onward)     Start     Ordered   07/14/23 0000  Diet - low sodium heart healthy        07/14/23 1025           Cardiac and Carb modified diet DISCHARGE MEDICATION: Allergies as of 07/14/2023   No Known Allergies      Medication List     STOP taking these medications    furosemide  40 MG tablet Commonly known as: LASIX    predniSONE  5 MG tablet Commonly known as: DELTASONE    sotalol  120 MG tablet Commonly known as: BETAPACE        TAKE these medications    amLODipine  5 MG tablet Commonly known as: NORVASC  Take 1 tablet (5 mg total) by mouth daily.   atorvastatin  80 MG tablet Commonly known as: LIPITOR  TAKE 1 TABLET EVERY DAY AT 6 PM   azaTHIOprine  50 MG tablet Commonly known as: IMURAN  TAKE 3 TABLETS EVERY DAY   clobetasol  0.05 % external solution Commonly known as: TEMOVATE  Apply 1 Application topically 2 (two) times daily. What changed:  when to take this reasons to take this   Clobetasol  Propionate E 0.05 % emollient cream Generic drug: Clobetasol  Prop Emollient Base APPLY TO AFFECTED AREA TWICE A DAY AS NEEDED   clopidogrel  75 MG tablet Commonly known as: PLAVIX  Take 1 tablet (75 mg total) by mouth daily.   docusate sodium  100 MG capsule Commonly known as: COLACE Take 1 capsule (100 mg total) by mouth daily.   Eliquis  5 MG Tabs tablet Generic drug: apixaban  TAKE 1 TABLET TWICE DAILY   feeding supplement Liqd Take 237 mLs by mouth 2 (two) times daily between meals.   ferrous sulfate 325 (65 FE) MG tablet Take 1 tablet (325 mg total) by mouth daily with breakfast. Start taking on: Jul 15, 2023   losartan  25 MG tablet Commonly known as: COZAAR  Take 1 tablet (25 mg total) by mouth  daily. Start taking on: Jul 15, 2023 What changed:  medication strength how much to take   methylPREDNISolone  4 MG Tbpk tablet Commonly known as: MEDROL  DOSEPAK Medrol  Dosepak take as instructed   metoprolol  succinate 50 MG 24 hr tablet Commonly known as: TOPROL -XL Take 1 tablet (50 mg total) by mouth 2 (two) times daily.   pantoprazole  40 MG tablet Commonly known as: PROTONIX  TAKE 1 TABLET TWICE DAILY   potassium chloride  SA 20 MEQ tablet Commonly known as: KLOR-CON  M TAKE 2 TABLETS TWICE DAILY   Torsemide 40 MG Tabs Take 40 mg by mouth daily. Start taking on: Jul 15, 2023               Durable Medical Equipment  (From admission, onward)           Start     Ordered   07/09/23 1451  For home use only DME 4 wheeled rolling walker with seat  Once  Comments: Patient fatigues easily during ambulation and will benefit from use of 4 wheeled walker with seat and brakes for safety.  Question:  Patient needs a walker to treat with the following condition  Answer:  Gait difficulty   07/09/23 1451              Discharge Care Instructions  (From admission, onward)           Start     Ordered   07/14/23 0000  Discharge wound care:       Comments: Continue wound care per nursing instructions   07/14/23 1025            Follow-up Information     [facility].   Specialty: Home Health Services Why: Will contact you to schedule home health visits. Contact information: [address] [place] [address] [phone]         [facility]. Call today.   Specialty: Cardiology Why: Waiting for family to schedule F/u appointment within a week of discharge. The [facility] in [facility] (Entrance C off [address])-Near the [facility] [address] [place] Parking @ the Entrance or park right under the  building [employer] Code [address]) Bring all your medications to follow-up appointment. Contact information: [address] [place]  [phone]               Discharge Exam: Cleavon Curls Weights   07/12/23 6644 07/13/23 0520 07/14/23 0347  Weight: 71.7 kg 71.1 kg 70.4 kg        General:  AAO x 3,  cooperative, no distress;   HEENT:  Normocephalic, PERRL, otherwise with in Normal limits   Neuro:  CNII-XII intact. , normal motor and sensation, reflexes intact   Lungs:   Clear to auscultation BL, Respirations unlabored,  No wheezes / crackles  Cardio:    S1/S2, RRR, No murmure, No Rubs or Gallops   Abdomen:  Soft, non-tender, bowel sounds active all four quadrants, no guarding or peritoneal signs.  Muscular  skeletal:  Limited exam -global generalized weaknesses - in bed, able to move all 4 extremities,   2+ pulses,  symmetric, No pitting edema  Skin:  Dry, warm to touch, negative for any Rashes,  Wounds: Please see nursing documentation          Condition at discharge: fair  The results of significant diagnostics from this hospitalization (including imaging, microbiology, ancillary and laboratory) are listed below for reference.   Imaging Studies: ECHOCARDIOGRAM LIMITED Result Date: 07/12/2023    ECHOCARDIOGRAM LIMITED REPORT   Patient Name:   EYOB WORM [facility] Date of Exam: 07/12/2023 Medical Rec #:  [id number]      Height:       66.0 in Accession #:    [id number]     Weight:       158.1 lb Date of Birth:  1956-02-15       BSA:          1.810 m Patient Age:    68 years       BP:           156/76 mmHg Patient Gender: M              HR:           82 bpm. Exam Location:  [facility] Procedure: Limited Echo, Limited Color Doppler  and Cardiac Doppler (Both            Spectral and Color Flow Doppler were utilized during procedure). Indications:    CHF  History:        Patient has prior history of Echocardiogram examinations, most                 recent 01/14/2023.  CHF, CAD, Signs/Symptoms:Chest Pain; Risk                 Factors:Hypertension and Dyslipidemia.  Sonographer:    Juanita Shaw Referring Phys: [id number] SCOTESIA Y DUNLAP IMPRESSIONS  1. Left ventricular ejection fraction, by estimation, is 35 to 40%. The left ventricle has moderately decreased function.  2. Right ventricular systolic function is normal. The right ventricular size is normal. There is moderately elevated pulmonary artery systolic pressure.  3. A small pericardial effusion is present. The pericardial effusion is surrounding the apex. Large pleural effusion in both left and right lateral regions.  4. No evidence of mitral valve regurgitation.  5. The aortic valve was not well visualized. Aortic valve regurgitation is not visualized.  6. The inferior vena cava is normal in size with greater than 50% respiratory variability, suggesting right atrial pressure of 3 mmHg. Comparison(s): Prior images reviewed side by side. Large-bilateral pleural effusions are new. FINDINGS  Left Ventricle: Left ventricular ejection fraction, by estimation, is 35 to 40%. The left ventricle has moderately decreased function. The left ventricular internal cavity size was normal in size. Right Ventricle: The right ventricular size is normal. No increase in right ventricular wall thickness. Right ventricular systolic function is normal. There is moderately elevated pulmonary artery systolic pressure. The tricuspid regurgitant velocity is 3.50 m/s, and with an assumed right atrial pressure of 3 mmHg, the estimated right ventricular systolic pressure is 52.0 mmHg. Pericardium: A small pericardial effusion is present. The pericardial effusion is surrounding the apex. Tricuspid Valve: The tricuspid valve is normal in structure. Tricuspid valve regurgitation is not demonstrated. No evidence of tricuspid stenosis. Aortic Valve: The aortic valve was not well visualized. Aortic valve regurgitation is not visualized. Venous: The inferior  vena cava is normal in size with greater than 50% respiratory variability, suggesting right atrial pressure of 3 mmHg. Additional Comments: There is a large pleural effusion in both left and right lateral regions.  LEFT VENTRICLE PLAX 2D LVIDd:         5.20 cm LVIDs:         4.10 cm LV PW:         1.10 cm LV IVS:        0.90 cm  LV Volumes (MOD) LV vol d, MOD A2C: 133.0 ml LV vol d, MOD A4C: 120.0 ml LV vol s, MOD A2C: 73.8 ml LV vol s, MOD A4C: 77.3 ml LV SV MOD A2C:     59.2 ml LV SV MOD A4C:     120.0 ml LV SV MOD BP:      52.9 ml RIGHT VENTRICLE            IVC RV S prime:     9.46 cm/s  IVC diam: 1.30 cm TAPSE (M-mode): 0.7 cm TRICUSPID VALVE TR Peak grad:   49.0 mmHg TR Vmax:        350.00 cm/s Gloriann Larger MD Electronically signed by Gloriann Larger MD Signature Date/Time: 07/12/2023/1:35:39 PM    Final    DG Chest 2 View Result Date: 07/08/2023 CLINICAL DATA:  Shortness of breath EXAM: CHEST -  2 VIEW COMPARISON:  June 08, 2023, 4:59 a.m. FINDINGS: Persistent bilateral pulmonary infiltrates with basilar atelectasis and bilateral pleural effusions findings that could correlate with pneumonia and superimposed congestive changes. Heart and mediastinum upper limits of normal with a left ventricular configuration. IMPRESSION: Persistent pulmonary infiltrates and atelectasis Electronically Signed   By: Fredrich Jefferson M.D.   On: 07/08/2023 15:37   NM PET Image Initial (PI) Skull Base To Thigh Result Date: 06/30/2023 CLINICAL DATA:  Initial treatment strategy for non-small-cell lung cancer. EXAM: NUCLEAR MEDICINE PET SKULL BASE TO THIGH TECHNIQUE: 8.06 mCi F-18 FDG was injected intravenously. Full-ring PET imaging was performed from the skull base to thigh after the radiotracer. CT data was obtained and used for attenuation correction and anatomic localization. Fasting blood glucose: 74 mg/dl COMPARISON:  Chest CT with contrast 06/06/2023. Older exams as well. Chest x-ray 06/08/2023 FINDINGS:  Mediastinal blood pool activity: SUV max 2.4 Liver activity: SUV max 2.4 NECK: Hypermetabolic lymph node seen to the right of the thyroid gland with maximum SUV value of 8.8. Lesion on image 35 of the CT scan series 202, measures 17 by 14 mm. No additional areas of abnormal soft tissue uptake identified in the neck including submandibular or posterior triangle regions. No left-sided nodal hypermetabolic uptake. There is symmetric uptake of the visualized intracranial compartment. Incidental CT findings: Visualized paranasal sinuses and mastoid air cells are clear. Mild streak artifact related to the patient's dental hardware. The parotid glands, submandibular glands and thyroid gland is unremarkable. CHEST: Subtle areas of uptake seen along nodes in the mediastinum and left hilum. Focus of most intense uptake in the left lung hilum has maximum SUV value of 7.0. Nodular thickening in this location on image 60. Nodal enlargement better seen on the prior contrast CT scan. Uptake in the mediastinum includes subcarinal, paratracheal and prevascular nodes. AP window lymph node. Uptake along the AP window node has maximum SUV of 4.8. Node seen on image 52. Additional area of uptake along right hilum with maximum SUV of 4.3. Patchy areas of uptake identified along left hemithorax in the left lower lobe with there is large area of consolidation. The central area of uptake has maximum SUV value of 5.6 diameter of this area on image 65 measures 2.2 cm. Distribution of the opacities similar to the prior contrast chest CT. There is also areas of confluent opacity seen in the lingula and right lower lobe. Areas of pleural effusion again seen including some loculated components on the left extending along the interlobar fissure towards the left lung apex. Incidental CT findings: Heart is nonenlarged. Coronary artery calcifications are seen. The thoracic aorta also has significant calcified plaque. Diffuse areas of interstitial  septal thickening with some emphysematous changes and breathing motion. ABDOMEN/PELVIS: Some small foci of uptake seen in the liver. Most pronounced is seen in the right hepatic lobe with maximum SUV value of 4.8. This is seen on image 77 with diameter of uptake approaching 10 mm. Intrinsic liver lesion is possible. There is a low-density lesion seen on the prior CT scan in this location as well. There is also some uptake along both adrenal glands. More pronounced on the left with focal nodule. Maximum SUV value 6.3. Area measures on the prior CT scan with contrast at 15 by 18 mm. Physiologic uptake along the bowel and renal collecting systems. Incidental CT findings: To extensive vascular calcifications along the aorta and branch vessels. No obstructing renal or ureteral stones. Motion. The bowel is nondilated. Scattered colonic  stool. Gallbladder is nondilated. SKELETON: Extensive osseous metastatic disease identified with extensive areas of sclerosis. There are patchy areas identified including multiple ribs, bilateral scapula, right humerus, sternum, clavicles. Extensive areas along the spine and pelvis. Bilateral femurs are involved. Example areas of uptake include left hemi sacrum with maximum SUV value of 11.2. Maximum SUV value of 7.9 involving the anterior aspect of the left third rib. Right humeral head maximum SUV 5.1. Incidental CT findings: Scattered degenerative changes. IMPRESSION: Extensive metastatic disease identified. Extensive bony sclerosis throughout the skeleton with areas of increased uptake. Numerous abnormal lymph nodes identified involving the mediastinum, hilum as well as the right thoracic inlet, supraclavicular region. Metastatic foci involving the left adrenal gland and likely a focus in the right hepatic lobe of the liver. Loculated pleural effusion on the left and non loculated right. Persistent consolidative opacities identified greatest in the left lower lobe with a central area of  increased uptake in the area of consolidation in left lower lobe. Additional area of disease is possible. Please correlate for primary neoplasm. Electronically Signed   By: Adrianna Horde M.D.   On: 06/30/2023 14:10   DG HIP UNILAT WITH PELVIS 2-3 VIEWS LEFT Result Date: 06/18/2023 CLINICAL DATA:  metastatic cancer to hip/pelvis EXAM: DG HIP (WITH OR WITHOUT PELVIS) 2-3V LEFT; DG HIP (WITH OR WITHOUT PELVIS) 2-3V RIGHT COMPARISON:  CT scan abdomen and pelvis from 06/12/2023. FINDINGS: Pelvis is intact with normal and symmetric sacroiliac joints. No acute fracture or dislocation. Redemonstration of extensive sclerotic lesions throughout the bones, compatible with patient's known history of metastasis. Visualized sacral arcuate lines are unremarkable. Unremarkable symphysis pubis. There are mild degenerative changes of bilateral hip joints without significant joint space narrowing. Osteophytosis of the superior acetabulum. No radiopaque foreign bodies. IMPRESSION: No acute fracture or dislocation. Redemonstration of extensive sclerotic lesions throughout the bones, compatible with patient's known history of metastasis. Electronically Signed   By: Beula Brunswick M.D.   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 FINDINGS: Pelvis is intact with normal and symmetric sacroiliac joints. No acute fracture or dislocation. Redemonstration of extensive sclerotic lesions throughout the bones, compatible with patient's known history of metastasis. Visualized sacral arcuate lines are unremarkable. Unremarkable symphysis pubis. There are mild degenerative changes of bilateral hip joints without significant joint space narrowing. Osteophytosis of the superior acetabulum. No radiopaque  foreign bodies. IMPRESSION: No acute fracture or dislocation. Redemonstration of extensive sclerotic lesions throughout the bones, compatible with patient's known history of metastasis. Electronically Signed   By: Beula Brunswick M.D.   On: 06/18/2023 14:44    Microbiology: Results for orders placed or performed during the hospital encounter of 06/06/23  Resp panel by RT-PCR (RSV, Flu A&B, Covid) Anterior Nasal Swab     Status: None   Collection Time: 06/06/23  3:28 PM   Specimen: Anterior Nasal Swab  Result Value Ref Range Status   SARS Coronavirus 2 by RT PCR NEGATIVE NEGATIVE Final    Comment: (NOTE) SARS-CoV-2 target nucleic acids are NOT DETECTED.  The SARS-CoV-2 RNA is generally detectable in upper respiratory specimens during the acute phase of infection. The lowest concentration of SARS-CoV-2 viral copies this assay can detect is 138 copies/mL. A negative result does not preclude SARS-Cov-2 infection and should  not be used as the sole basis for treatment or other patient management decisions. A negative result may occur with  improper specimen collection/handling, submission of specimen other than nasopharyngeal swab, presence of viral mutation(s) within the areas targeted by this assay, and inadequate number of viral copies(<138 copies/mL). A negative result must be combined with clinical observations, patient history, and epidemiological information. The expected result is Negative.  Fact Sheet for Patients:  BloggerCourse.com  Fact Sheet for Healthcare Providers:  SeriousBroker.it  This test is no t yet approved or cleared by the [place]  FDA and  has been authorized for detection and/or diagnosis of SARS-CoV-2 by FDA under an Emergency Use Authorization (EUA). This EUA will remain  in effect (meaning this test can be used) for the duration of the COVID-19 declaration under Section 564(b)(1) of the Act,  21 U.S.C.section 360bbb-3(b)(1), unless the authorization is terminated  or revoked sooner.       Influenza A by PCR NEGATIVE NEGATIVE Final   Influenza B by PCR NEGATIVE NEGATIVE Final    Comment: (NOTE) The Xpert Xpress SARS-CoV-2/FLU/RSV plus assay is intended as an aid in the diagnosis of influenza from Nasopharyngeal swab specimens and should not be used as a sole basis for treatment. Nasal washings and aspirates are unacceptable for Xpert Xpress SARS-CoV-2/FLU/RSV testing.  Fact Sheet for Patients: BloggerCourse.com  Fact Sheet for Healthcare Providers: SeriousBroker.it  This test is not yet approved or cleared by the [place]  FDA and has been authorized for detection and/or diagnosis of SARS-CoV-2 by FDA under an Emergency Use Authorization (EUA). This EUA will remain in effect (meaning this test can be used) for the duration of the COVID-19 declaration under Section 564(b)(1) of the Act, 21 U.S.C. section 360bbb-3(b)(1), unless the authorization is terminated or revoked.     Resp Syncytial Virus by PCR NEGATIVE NEGATIVE Final    Comment: (NOTE) Fact Sheet for Patients: BloggerCourse.com  Fact Sheet for Healthcare Providers: SeriousBroker.it  This test is not yet approved or cleared by the [place]  FDA and has been authorized for detection and/or diagnosis of SARS-CoV-2 by FDA under an Emergency Use Authorization (EUA). This EUA will remain in effect (meaning this test can be used) for the duration of the COVID-19 declaration under Section 564(b)(1) of the Act, 21 U.S.C. section 360bbb-3(b)(1), unless the authorization is terminated or revoked.  Performed at [facility], [address], [place], [place] [address]   MRSA Next Gen by PCR, Nasal     Status: None   Collection Time: 06/06/23  8:23 PM   Specimen: Nasal Mucosa; Nasal Swab  Result Value Ref  Range Status   MRSA by PCR Next Gen NOT DETECTED NOT DETECTED Final    Comment: (NOTE) The GeneXpert MRSA Assay (FDA approved for NASAL specimens only), is one component of a comprehensive MRSA colonization surveillance program. It is not intended to diagnose MRSA infection nor to guide or monitor treatment for MRSA infections. Test performance is not FDA approved in patients less than 61 years old. Performed at [facility], [address], [place], [place] [address]   Blood culture (routine x 2)     Status: None   Collection Time: 06/06/23  8:44 PM   Specimen: BLOOD RIGHT HAND  Result Value Ref Range Status   Specimen Description BLOOD RIGHT HAND  Final   Special Requests   Final    BOTTLES DRAWN AEROBIC AND ANAEROBIC Blood Culture adequate volume   Culture   Final    NO  GROWTH 5 DAYS Performed at [facility], [address] [place], [place] [address]    Report Status 06/11/2023 FINAL  Final  Blood culture (routine x 2)     Status: None   Collection Time: 06/06/23  8:51 PM   Specimen: Left Antecubital; Blood  Result Value Ref Range Status   Specimen Description LEFT ANTECUBITAL  Final   Special Requests   Final    BOTTLES DRAWN AEROBIC AND ANAEROBIC Blood Culture adequate volume   Culture   Final    NO GROWTH 5 DAYS Performed at [facility], [address] [place], [place] [address]    Report Status 06/11/2023 FINAL  Final  Culture, blood (routine x 2) Call MD if unable to obtain prior to antibiotics being given     Status: None   Collection Time: 06/07/23  2:48 AM   Specimen: BLOOD RIGHT HAND  Result Value Ref Range Status   Specimen Description BLOOD RIGHT HAND  Final   Special Requests   Final    BOTTLES DRAWN AEROBIC AND ANAEROBIC Blood Culture results may not be optimal due to an inadequate volume of blood received in culture bottles   Culture   Final    NO GROWTH 5 DAYS Performed at [facility], [address] [place], [place] [address]    Report Status  06/12/2023 FINAL  Final  Culture, blood (routine x 2) Call MD if unable to obtain prior to antibiotics being given     Status: None   Collection Time: 06/07/23  2:49 AM   Specimen: BLOOD  Result Value Ref Range Status   Specimen Description BLOOD LEFT ANTECUBITAL  Final   Special Requests   Final    BOTTLES DRAWN AEROBIC AND ANAEROBIC Blood Culture adequate volume   Culture   Final    NO GROWTH 5 DAYS Performed at [facility], [address], [place], [place] [address]    Report Status 06/12/2023 FINAL  Final    Labs: CBC: Recent Labs  Lab 07/08/23 1536 07/09/23 0444  WBC 8.3 7.9  NEUTROABS 5.1  --   HGB 10.3* 9.9*  HCT 32.1* 31.0*  MCV 89.7 90.1  PLT 363 344   Basic Metabolic Panel: Recent Labs  Lab 07/08/23 1806 07/09/23 0444 07/10/23 0437 07/11/23 0253 07/12/23 0441 07/13/23 0510 07/14/23 0442  NA  --    < > 128* 133* 133* 133* 136  K  --    < > 5.1 4.8 4.8 4.1 4.1  CL  --    < > 87* 85* 84* 82* 81*  CO2  --    < > 32 38* 40* 38* 45*  GLUCOSE  --    < > 131* 108* 98 70 83  BUN  --    < > 16 12 17 19 19   CREATININE  --    < > 0.35* <0.30* 0.30* 0.40* 0.44*  CALCIUM   --    < > 8.7* 9.3 9.2 9.3 9.2  MG 1.9  --   --   --   --  2.1  --    < > = values in this interval not displayed.   Liver Function Tests: Recent Labs  Lab 07/09/23 1842  AST 47*  ALT 45*  ALKPHOS 183*  BILITOT 0.5  PROT 6.3*  ALBUMIN  2.4*   CBG: No results for input(s): "GLUCAP" in the last 168 hours.  Discharge time spent: greater than 30 minutes.  Signed: Bobbetta Burnet, MD  [facility] 07/14/2023

## 2023-07-14 NOTE — Progress Notes (Signed)
   07/14/23 0800  ReDS Vest / Clip  Station Marker D  Ruler Value 36  ReDS Value Range  (low quality)  ReDS Actual Value  (low quality)

## 2023-07-14 NOTE — Progress Notes (Signed)
 Heart Failure Navigator Progress Note  Heart Failure Teaching completed with patient remotely at [facility] on 07/11/2023 via telephone.  Patient's daughter Derryk Fitts contacted per patient request to schedule his hospital follow-up within a week of discharge.  Leola Raisin did not want to schedule that appointment at this time.  The Advanced Heart Failure clinic contact information was provided on his discharge paperwork for Leola Raisin to call back and schedule per her request.  Navigator will sign off at this time.  Celedonio Coil, RN, BSN [facility] Heart Failure Navigator Secure Chat Only

## 2023-07-14 NOTE — Progress Notes (Incomplete)
 [facility] [address] [place] [address]   Clinic Day:  07/14/2023  Referring physician: [name], PA-C  Patient Care Team: Curtis Krauss, PA-C as PCP - General (Physician Assistant) Curtis Leigh, MD as PCP - Cardiology (Cardiology) Curtis Repine, MD as Consulting Physician (Pulmonary Disease) Curtis Bari, MD as Consulting Physician (Rheumatology) Curtis Knows, MD as Attending Physician (Family Medicine) Curtis Cheadle Windsor Hatcher, MD as Consulting Physician (Gastroenterology) Curtis Boros, MD as Medical Oncologist (Medical Oncology) Curtis Knuckles, RN as Oncology Nurse Navigator (Medical Oncology)   ASSESSMENT & PLAN:   Assessment:  1.  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  IHC positive for Napsin A, TTF-1 and CK7.  Negative for Prostein, PSA, CK20, CDX2 and calretinin. - CT chest (06/06/2023): Bilateral pleural effusions, left more than right.  Persistent fibrotic change and left lower lobe consolidation.  Extensive sclerotic osseous lesions.  Hilar and mediastinal nodes measuring up to 12 mm (subcarinal). - CTAP (06/12/2023): Diffuse sclerotic bony metastasis with extensive involvement of bilateral hips.  Indeterminate left adrenal lesion.  Indeterminate subcentimeter hypodensity within the right lobe of the liver, too small to characterize.  2.  Social/family history: - Lives at home with his wife.  On O2 nasal cannula since discharge from the hospital on 06/12/2023.  Quit smoking 20 years ago.  Smoked half pack per day for 20 years.  No asbestos or chemical exposure. - No family history of malignancy.   Plan:  1.  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 - We have also reviewed pathology report in detail. - Recommend whole-body PET/CT scan and MRI of the brain. - Will ask pathology to see if there are adequate cells from cytology for NGS testing. - Will send [employer] testing today.  2.  Extensive bone metastasis: - PSA was normal at 0.74.  He reports mild left-sided rib pain since thoracentesis. - Due to the findings on the CT scan of bilateral hips, I have recommended x-rays to see if any orthopedic intervention needed. - I have recommended limited weightbearing. - He will need a rollator walker for ambulation.  3.  Sleeplessness: - He is unable to sleep.  Will start him on trazodone  50 mg at bedtime and titrate up as needed.  4.  Mild hypercalcemia: - Calcium  today is 9.4 with albumin  2.6, corrected calcium  of 10.5.  Encouraged aggressive hydration.  Will closely monitor.   No orders of the defined types were placed in this encounter.     Curtis Flores,acting as a Neurosurgeon for Curtis Boros, MD.,have documented all relevant documentation on the behalf of Curtis Boros, MD,as directed by  Curtis Boros, MD while in the presence of Curtis Boros, MD.  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    Prior Therapy: None  Current Therapy: Under workup   HISTORY OF PRESENT ILLNESS:   Oncology History   No history exists.      Curtis Flores is a 68 y.o. 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   Patient was admitted to the hospital from 06/07/23 to 06/13/23 for pneumonia after previously being diagnosed with lower lobe pneumonia on 04/19/23 s/p Levaquin  course. Curtis Flores  was treated with cefepime , azithromycin , and solu-medrol  transitioned to prednisone  during hospitalization. Imaging showed bilateral pleural effusions, left greater than right. He underwent left thoracentesis on 06/07/23, draining 250 mL of amber-colored fluid with left lung collapse. Cytology of pleural fluid showed: adenocarcinoma with malignant cells positive for Napsin A, TTF-1 and cytokeratin 7.   CT chest high resolution from 05/26/23 found: Pulmonary parenchymal pattern of fibrosis, as detailed above, grossly similar to prior exams. Persistent superimposed patchy ground-glass and consolidation, progressive in the left lower lobe, worrisome for occult malignancy. Highly loculated moderate left pleural effusion in simple right pleural effusion. Diffuse osseous metastatic disease, suggestive of prostate cancer. Aortic atherosclerosis. Coronary artery calcification. Enlarged pulmonic trunk, indicative of pulmonary arterial hypertension.  CT A/P from 06/12/23 showed: Diffuse sclerotic bony metastases as above. Extensive involvement of the bilateral hips may place the patient at risk for fracture. In a male patient of this age, prostate cancer is the diagnosis of exclusion. Indeterminate left adrenal lesion, new since 10/21/2021 chest CT. Metastatic disease cannot be excluded. Indeterminate subcentimeter hypodensity within the right lobe liver, too small to characterize. Aortic Atherosclerosis.   Today, he states that he is doing well overall. His appetite level is at 100%. His energy level is at 50%.  INTERVAL HISTORY:   Curtis Flores is a 68 y.o. male presenting to the clinic today for follow-up of adenocarcinoma of left pleural fluid, likely lung primary. He was last seen by me on 06/18/23 in  consultation.  Since his last visit, he underwent initial PET on 06/26/23 that found: Extensive metastatic disease identified. Extensive bony sclerosis throughout the skeleton with areas of increased uptake. Numerous abnormal lymph nodes identified involving the mediastinum, hilum as well as the right thoracic inlet, supraclavicular region. Metastatic foci involving the left adrenal gland and likely a focus in the right hepatic lobe of the liver. Loculated pleural effusion on the left and non loculated right. Persistent consolidative opacities identified greatest in the left lower lobe with a central area of increased uptake in the area of consolidation in left lower lobe. Additional area of disease is possible.  Left and Right DG hip from 06/18/23 showed: No acute fracture or dislocation. Redemonstration of extensive sclerotic lesions throughout the bones, compatible with patient's known history of metastasis.   Epifanio was admitted to the hospital from 07/08/23 to 07/14/23 for acute on chronic systolic and diastolic CHF, treated with IV Lasix .   Today, he states that he is doing well overall. His appetite level is at ***%. His energy level is at ***%.   PAST MEDICAL HISTORY:   Past Medical History: Past Medical History:  Diagnosis Date   Abnormal CT scan, stomach    Chronic systolic dysfunction of left ventricle    Coronary artery disease    lateral STEMI 02/22/15 3/10 cutting balloon to ISR of the o/pLCX   GERD (gastroesophageal reflux disease)    Hyperlipidemia    Hypertension    Ischemic cardiomyopathy    LBBB (left bundle branch block)    Leukocytosis    followed by hematology, reactive   Lymphadenopathy  Myocardial infarction (HCC) 02/2015   Psoriasis 2003   Psoriatic arthritis (HCC)    Pulmonary fibrosis (HCC)    Rheumatoid arthritis(714.0) 2012   Typical atrial flutter [facility])     Surgical History: Past Surgical History:  Procedure Laterality Date   CARDIAC CATHETERIZATION  N/A 02/22/2015   Procedure: Left Heart Cath and Coronary Angiography;  Surgeon: Curtis Leigh, MD;  Location: [facility] INVASIVE CV LAB;  Service: Cardiovascular;  Laterality: N/A;   CARDIAC CATHETERIZATION N/A 02/22/2015   Procedure: Coronary Stent Intervention;  Surgeon: Curtis Leigh, MD;  Location: [facility] INVASIVE CV LAB;  Service: Cardiovascular;  Laterality: N/A;   CARDIOVERSION N/A 04/12/2015   Procedure: CARDIOVERSION;  Surgeon: Darlis Eisenmenger, MD;  Location: [facility] ENDOSCOPY;  Service: Cardiovascular;  Laterality: N/A;   COLONOSCOPY  07/01/2003   [id number] colonic mucosa except for the proximal right colon in the area of ICV which was not seen completely due to inadequate bowel prep. followed with ACBE which was normal.    COLONOSCOPY N/A 08/24/2015   Dr. Riley Cheadle: Normal colon. Next colonoscopy in 10 years.   CORONARY BALLOON ANGIOPLASTY N/A 05/12/2018   Procedure: CORONARY BALLOON ANGIOPLASTY;  Surgeon: Arty Binning, MD;  Location: [facility] INVASIVE CV LAB;  Service: Cardiovascular;  Laterality: N/A;   ELECTROPHYSIOLOGIC STUDY N/A 05/30/2015   Atrial fibrillation ablation by Dr Nunzio Belch   ESOPHAGOGASTRODUODENOSCOPY N/A 08/24/2015   Dr. Riley Cheadle: Medium-sized hiatal hernia, erosive gastropathy. Cameron lesions. Esophageal mucosa distally suggestive of short segment Barrett's esophagus. Not confirmed on biopsy. Gastric biopsy with minimal chronic inflammation   GIVENS CAPSULE STUDY N/A 04/17/2016   Procedure: GIVENS CAPSULE STUDY;  Surgeon: Suzette Espy, MD;  Location: [facility] ENDO SUITE;  Service: Endoscopy;  Laterality: N/A;  Pt to arrive at 8:00 am for 8:30 am appt   LEFT HEART CATH AND CORONARY ANGIOGRAPHY N/A 05/11/2018   Procedure: LEFT HEART CATH AND CORONARY ANGIOGRAPHY;  Surgeon: Arty Binning, MD;  Location: [facility] INVASIVE CV LAB;  Service: Cardiovascular;  Laterality: N/A;   TEE WITHOUT CARDIOVERSION N/A 04/12/2015   Procedure: TRANSESOPHAGEAL ECHOCARDIOGRAM (TEE);  Surgeon: Darlis Eisenmenger, MD;  Location: [facility]  ENDOSCOPY;  Service: Cardiovascular;  Laterality: N/A;    Social History: Social History   Socioeconomic History   Marital status: Married    Spouse name: Adell Hones   Number of children: 2   Years of education: Not on file   Highest education level: 9th grade  Occupational History   Occupation: unemployed    Comment: not working do to arthritis; used to be Production designer, 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   Drug use: No    Comment: H/O marijuana use many years ago.   Sexual activity: Yes    Birth control/protection: None  Other Topics Concern   Not on file  Social History Narrative   Not on file   Social Drivers of Health  Financial Resource Strain: Low Risk  (04/29/2023)   Received from [facility]   Overall Financial Resource Strain (CARDIA)    Difficulty of Paying Living Expenses: Not very hard  Food Insecurity: No Food Insecurity (07/11/2023)   Hunger Vital Sign    Worried About Running Out of Food in the Last Year: Never true    Ran Out of Food in the Last Year: Never true  Transportation Needs: No Transportation Needs (07/11/2023)   PRAPARE - Administrator, Civil Service (Medical): No    Lack of Transportation (Non-Medical): No  Physical Activity: Unknown (04/29/2023)   Received from [facility]   Exercise Vital Sign    Days of Exercise per Week: 0 days    Minutes of Exercise per Session: Not on file  Stress: No Stress Concern Present (04/29/2023)   Received from [facility] - Occupational Stress Questionnaire    Feeling of Stress : Not at all  Social Connections: Moderately Integrated (07/08/2023)   Social Connection and Isolation Panel [NHANES]    Frequency of Communication with Friends and Family: Once a week    Frequency of Social Gatherings with Friends and Family: Once a week    Attends Religious Services: 1 to 4 times per year    Active Member of [employer] or Organizations: Yes    Attends Banker Meetings: Never    Marital Status: Married  Catering manager Violence: Not At Risk (07/08/2023)   Humiliation, Afraid, Rape, and Kick questionnaire    Fear of Current or Ex-Partner: No    Emotionally Abused: No    Physically Abused: No    Sexually Abused: No    Family History: Family History  Problem Relation Age of Onset   Hypertension Mother    Colon cancer Neg Hx     Current Medications:  Current Outpatient Medications:    amLODipine  (NORVASC ) 5 MG tablet, Take 1 tablet (5 mg total) by mouth daily., Disp: 90 tablet, Rfl: 3   apixaban  (ELIQUIS ) 5 MG TABS tablet, TAKE 1 TABLET TWICE DAILY, Disp: 180 tablet, Rfl: 1   atorvastatin  (LIPITOR ) 80 MG tablet, TAKE 1 TABLET EVERY DAY AT 6 PM, Disp: 90 tablet, Rfl: 2   azaTHIOprine  (IMURAN ) 50 MG tablet, TAKE 3 TABLETS EVERY DAY, Disp: 270 tablet, Rfl: 0   clobetasol  (TEMOVATE ) 0.05 % external solution, Apply 1 Application topically 2 (two) times daily. (Patient taking differently: Apply 1 Application topically 2 (two) times daily as needed (Dermatitis).), Disp: 50 mL, Rfl: 0   Clobetasol  Prop Emollient Base (CLOBETASOL  PROPIONATE E) 0.05 % emollient cream, APPLY TO AFFECTED AREA TWICE A DAY AS NEEDED, Disp: 30 g, Rfl: 2   clopidogrel  (PLAVIX ) 75 MG tablet, Take 1 tablet (75 mg total) by mouth daily., Disp: 90 tablet, Rfl: 3   docusate sodium  (COLACE) 100 MG capsule, Take 1 capsule (100 mg total) by mouth daily., Disp: 30 capsule, Rfl: 0   feeding  supplement (ENSURE ENLIVE / ENSURE PLUS) LIQD, Take 237 mLs by mouth 2 (two) times daily between meals., Disp: , Rfl:    [START ON 07/15/2023] ferrous sulfate 325 (65 FE) MG tablet, Take 1 tablet (325 mg total) by mouth daily with breakfast., Disp: 30 tablet, Rfl: 3   [START ON 07/15/2023] losartan  (COZAAR ) 25 MG tablet, Take 1 tablet (25 mg total) by mouth daily., Disp: 30 tablet, Rfl: 1   methylPREDNISolone  (MEDROL  DOSEPAK) 4 MG TBPK tablet, Medrol  Dosepak take as instructed,  Disp: 21 tablet, Rfl: 0   metoprolol  succinate (TOPROL -XL) 50 MG 24 hr tablet, Take 1 tablet (50 mg total) by mouth 2 (two) times daily., Disp: 60 tablet, Rfl: 1   pantoprazole  (PROTONIX ) 40 MG tablet, TAKE 1 TABLET TWICE DAILY, Disp: 180 tablet, Rfl: 1   potassium chloride  SA (KLOR-CON  M) 20 MEQ tablet, TAKE 2 TABLETS TWICE DAILY, Disp: 360 tablet, Rfl: 3   [START ON 07/15/2023] torsemide 40 MG TABS, Take 40 mg by mouth daily., Disp: 30 tablet, Rfl: 1 No current facility-administered medications for this visit.  Facility-Administered Medications Ordered in Other Visits:    ALPRAZolam (XANAX) tablet 0.5 mg, 0.5 mg, Oral, TID PRN, Shahmehdi, Seyed A, MD   amLODipine  (NORVASC ) tablet 5 mg, 5 mg, Oral, Daily, Shahmehdi, Seyed A, MD, 5 mg at 07/14/23 1610   apixaban  (ELIQUIS ) tablet 5 mg, 5 mg, Oral, BID, Merrell, David J, MD, 5 mg at 07/14/23 9604   atorvastatin  (LIPITOR ) tablet 80 mg, 80 mg, Oral, q1800, Merrell, David J, MD, 80 mg at 07/13/23 1623   azaTHIOprine  (IMURAN ) tablet 50 mg, 50 mg, Oral, Daily, Merrell, David J, MD, 50 mg at 07/14/23 5409   clopidogrel  (PLAVIX ) tablet 75 mg, 75 mg, Oral, Daily, Shahmehdi, Seyed A, MD, 75 mg at 07/14/23 0859   feeding supplement (ENSURE ENLIVE / ENSURE PLUS) liquid 237 mL, 237 mL, Oral, BID BM, Merrell, David J, MD, 237 mL at 07/14/23 0901   ferrous sulfate tablet 325 mg, 325 mg, Oral, Q breakfast, Shahmehdi, Seyed A, MD, 325 mg at 07/14/23 0857   HYDROmorphone  (DILAUDID ) injection 1  mg, 1 mg, Intravenous, Q4H PRN, Shahmehdi, Seyed A, MD   ipratropium (ATROVENT ) nebulizer solution 0.5 mg, 0.5 mg, Nebulization, BID, Shahmehdi, Seyed A, MD   levalbuterol  (XOPENEX ) nebulizer solution 1.25 mg, 1.25 mg, Nebulization, Q6H PRN, Shahmehdi, Seyed A, MD, 1.25 mg at 07/12/23 2043   levalbuterol  (XOPENEX ) nebulizer solution 1.25 mg, 1.25 mg, Nebulization, BID, Shahmehdi, Seyed A, MD   liver oil-zinc  oxide (DESITIN) 40 % ointment, , Topical, TID, Shahmehdi, Seyed A, MD, Given at 07/14/23 0901   losartan  (COZAAR ) tablet 25 mg, 25 mg, Oral, Daily, Branch, Jonathan F, MD, 25 mg at 07/14/23 1008   metoprolol  succinate (TOPROL -XL) 24 hr tablet 50 mg, 50 mg, Oral, BID, Branch, Joyceann No, MD   oxyCODONE -acetaminophen  (PERCOCET/ROXICET) 5-325 MG per tablet 1 tablet, 1 tablet, Oral, Q6H PRN, Shahmehdi, Seyed A, MD, 1 tablet at 07/12/23 2101   pantoprazole  (PROTONIX ) EC tablet 40 mg, 40 mg, Oral, Daily, Shahmehdi, Seyed A, MD, 40 mg at 07/14/23 0857   potassium chloride  SA (KLOR-CON  M) CR tablet 20 mEq, 20 mEq, Oral, Daily, Shahmehdi, Seyed A, MD, 20 mEq at 07/14/23 0900   predniSONE  (DELTASONE ) tablet 40 mg, 40 mg, Oral, Q breakfast, Shahmehdi, Seyed A, MD, 40 mg at 07/14/23 0858   senna-docusate (Senokot-S) tablet 1 tablet, 1 tablet, Oral, QHS, Shahmehdi, Seyed A, MD, 1 tablet at 07/13/23 2100   torsemide (DEMADEX) tablet 40 mg, 40 mg, Oral, Daily, Shahmehdi, Seyed A, MD, 40 mg at 07/14/23 8119   Allergies: No Known Allergies  REVIEW OF SYSTEMS:   Review of Systems  Constitutional:  Negative for chills, fatigue and fever.  HENT:   Negative for lump/mass, mouth sores, nosebleeds, sore throat and trouble swallowing.   Eyes:  Negative for eye problems.  Respiratory:  Negative for cough and shortness of breath.   Cardiovascular:  Negative for chest pain, leg swelling and palpitations.  Gastrointestinal:  Negative for  abdominal pain, constipation, diarrhea, nausea and vomiting.  Genitourinary:   Negative for bladder incontinence, difficulty urinating, dysuria, frequency, hematuria and nocturia.   Musculoskeletal:  Negative for arthralgias, back pain, flank pain, myalgias and neck pain.  Skin:  Negative for itching and rash.  Neurological:  Negative for dizziness, headaches and numbness.  Hematological:  Does not bruise/bleed easily.  Psychiatric/Behavioral:  Negative for depression, sleep disturbance and suicidal ideas. The patient is not nervous/anxious.   All other systems reviewed and are negative.    VITALS:   There were no vitals taken for this visit.  Wt Readings from Last 3 Encounters:  07/14/23 155 lb 3.3 oz (70.4 kg)  06/18/23 159 lb 2.8 oz (72.2 kg)  06/09/23 158 lb 8.2 oz (71.9 kg)    There is no height or weight on file to calculate BMI.  Performance status (ECOG): 1 - Symptomatic but completely ambulatory  PHYSICAL EXAM:   Physical Exam Vitals and nursing note reviewed. Exam conducted with a chaperone present.  Constitutional:      Appearance: Normal appearance.  Cardiovascular:     Rate and Rhythm: Normal rate and regular rhythm.     Pulses: Normal pulses.     Heart sounds: Normal heart sounds.  Pulmonary:     Effort: Pulmonary effort is normal.     Breath sounds: Normal breath sounds.  Abdominal:     Palpations: Abdomen is soft. There is no hepatomegaly, splenomegaly or mass.     Tenderness: There is no abdominal tenderness.  Musculoskeletal:     Right lower leg: No edema.     Left lower leg: No edema.  Lymphadenopathy:     Cervical: No cervical adenopathy.     Right cervical: No superficial, deep or posterior cervical adenopathy.    Left cervical: No superficial, deep or posterior cervical adenopathy.     Upper Body:     Right upper body: No supraclavicular or axillary adenopathy.     Left upper body: No supraclavicular or axillary adenopathy.  Neurological:     General: No focal deficit present.     Mental Status: He is alert and oriented to  person, place, and time.  Psychiatric:        Mood and Affect: Mood normal.        Behavior: Behavior normal.     LABS:   CBC    Component Value Date/Time   WBC 7.9 07/09/2023 0444   RBC 3.44 (L) 07/09/2023 0444   HGB 9.9 (L) 07/09/2023 0444   HGB 14.5 08/12/2011 1256   HCT 31.0 (L) 07/09/2023 0444   HCT 44.7 08/12/2011 1256   PLT 344 07/09/2023 0444   PLT 240 08/12/2011 1256   MCV 90.1 07/09/2023 0444   MCV 82.2 08/12/2011 1256   MCH 28.8 07/09/2023 0444   MCHC 31.9 07/09/2023 0444   RDW 16.4 (H) 07/09/2023 0444   RDW 14.7 (H) 08/12/2011 1256   LYMPHSABS 1.6 07/08/2023 1536   LYMPHSABS 5.5 (H) 08/12/2011 1256   MONOABS 1.3 (H) 07/08/2023 1536   MONOABS 1.6 (H) 08/12/2011 1256   EOSABS 0.2 07/08/2023 1536   EOSABS 0.2 08/12/2011 1256   BASOSABS 0.0 07/08/2023 1536   BASOSABS 0.1 08/12/2011 1256    CMP    Component Value Date/Time   NA 136 07/14/2023 0442   K 4.1 07/14/2023 0442   CL 81 (L) 07/14/2023 0442   CO2 45 (H) 07/14/2023 0442   GLUCOSE 83 07/14/2023 0442   BUN 19 07/14/2023 0442  CREATININE 0.44 (L) 07/14/2023 0442   CREATININE 0.68 (L) 05/25/2021 1021   CALCIUM  9.2 07/14/2023 0442   PROT 6.3 (L) 07/09/2023 1842   ALBUMIN  2.4 (L) 07/09/2023 1842   AST 47 (H) 07/09/2023 1842   ALT 45 (H) 07/09/2023 1842   ALKPHOS 183 (H) 07/09/2023 1842   BILITOT 0.5 07/09/2023 1842   GFRNONAA >60 07/14/2023 0442   GFRNONAA 98 03/14/2020 1304   GFRAA 114 03/14/2020 1304     No results found for: "CEA1", "CEA" / No results found for: "CEA1", "CEA" No results found for: "PSA1" No results found for: "CAN199" No results found for: "CAN125"  Lab Results  Component Value Date   TOTALPROTELP 7.1 09/19/2016   ALBUMINELP 3.5 (L) 09/19/2016   A1GS 0.4 (H) 09/19/2016   A2GS 0.9 09/19/2016   BETS 0.5 09/19/2016   BETA2SER 0.5 09/19/2016   GAMS 1.3 09/19/2016   SPEI   09/19/2016   Lab Results  Component Value Date   TIBC 155 (L) 07/10/2023   TIBC 213 (L)  07/17/2016   TIBC 202 (L) 01/19/2016   FERRITIN 512 (H) 07/10/2023   FERRITIN 188 01/28/2018   FERRITIN 444 (H) 07/17/2016   IRONPCTSAT 19 07/10/2023   IRONPCTSAT 15 07/17/2016   IRONPCTSAT 9 (L) 01/19/2016   Lab Results  Component Value Date   LDH 256 (H) 06/18/2023   LDH 205 (H) 03/25/2023   LDH 151 03/19/2022     STUDIES:   ECHOCARDIOGRAM LIMITED Result Date: 07/12/2023    ECHOCARDIOGRAM LIMITED REPORT   Patient Name:   Curtis Flores [facility] Date of Exam: 07/12/2023 Medical Rec #:  [id number]      Height:       66.0 in Accession #:    [id number]     Weight:       158.1 lb Date of Birth:  [birth year]       BSA:          1.810 m Patient Age:    68 years       BP:           156/76 mmHg Patient Gender: M              HR:           82 bpm. Exam Location:  [facility] Procedure: Limited Echo, Limited Color Doppler and Cardiac Doppler (Both            Spectral and Color Flow Doppler were utilized during procedure). Indications:    CHF  History:        Patient has prior history of Echocardiogram examinations, most                 recent 01/14/2023. CHF, CAD, Signs/Symptoms:Chest Pain; Risk                 Factors:Hypertension and Dyslipidemia.  Sonographer:    Juanita Shaw Referring Phys: [id number] SCOTESIA Y DUNLAP IMPRESSIONS  1. Left ventricular ejection fraction, by estimation, is 35 to 40%. The left ventricle has moderately decreased function.  2. Right ventricular systolic function is normal. The right ventricular size is normal. There is moderately elevated pulmonary artery systolic pressure.  3. A small pericardial effusion is present. The pericardial effusion is surrounding the apex. Large pleural effusion in both left and right lateral regions.  4. No evidence of mitral valve regurgitation.  5. The aortic valve was not well visualized. Aortic valve regurgitation is not visualized.  6. The inferior vena cava is  normal in size with greater than 50% respiratory variability, suggesting right atrial pressure  of 3 mmHg. Comparison(s): Prior images reviewed side by side. Large-bilateral pleural effusions are new. FINDINGS  Left Ventricle: Left ventricular ejection fraction, by estimation, is 35 to 40%. The left ventricle has moderately decreased function. The left ventricular internal cavity size was normal in size. Right Ventricle: The right ventricular size is normal. No increase in right ventricular wall thickness. Right ventricular systolic function is normal. There is moderately elevated pulmonary artery systolic pressure. The tricuspid regurgitant velocity is 3.50 m/s, and with an assumed right atrial pressure of 3 mmHg, the estimated right ventricular systolic pressure is 52.0 mmHg. Pericardium: A small pericardial effusion is present. The pericardial effusion is surrounding the apex. Tricuspid Valve: The tricuspid valve is normal in structure. Tricuspid valve regurgitation is not demonstrated. No evidence of tricuspid stenosis. Aortic Valve: The aortic valve was not well visualized. Aortic valve regurgitation is not visualized. Venous: The inferior vena cava is normal in size with greater than 50% respiratory variability, suggesting right atrial pressure of 3 mmHg. Additional Comments: There is a large pleural effusion in both left and right lateral regions.  LEFT VENTRICLE PLAX 2D LVIDd:         5.20 cm LVIDs:         4.10 cm LV PW:         1.10 cm LV IVS:        0.90 cm  LV Volumes (MOD) LV vol d, MOD A2C: 133.0 ml LV vol d, MOD A4C: 120.0 ml LV vol s, MOD A2C: 73.8 ml LV vol s, MOD A4C: 77.3 ml LV SV MOD A2C:     59.2 ml LV SV MOD A4C:     120.0 ml LV SV MOD BP:      52.9 ml RIGHT VENTRICLE            IVC RV S prime:     9.46 cm/s  IVC diam: 1.30 cm TAPSE (M-mode): 0.7 cm TRICUSPID VALVE TR Peak grad:   49.0 mmHg TR Vmax:        350.00 cm/s Gloriann Larger MD Electronically signed by Gloriann Larger MD Signature Date/Time: 07/12/2023/1:35:39 PM    Final    DG Chest 2 View Result Date:  07/08/2023 CLINICAL DATA:  Shortness of breath EXAM: CHEST - 2 VIEW COMPARISON:  June 08, 2023, 4:59 a.m. FINDINGS: Persistent bilateral pulmonary infiltrates with basilar atelectasis and bilateral pleural effusions findings that could correlate with pneumonia and superimposed congestive changes. Heart and mediastinum upper limits of normal with a left ventricular configuration. IMPRESSION: Persistent pulmonary infiltrates and atelectasis Electronically Signed   By: Fredrich Jefferson M.D.   On: 07/08/2023 15:37   NM PET Image Initial (PI) Skull Base To Thigh Result Date: 06/30/2023 CLINICAL DATA:  Initial treatment strategy for non-small-cell lung cancer. EXAM: NUCLEAR MEDICINE PET SKULL BASE TO THIGH TECHNIQUE: 8.06 mCi F-18 FDG was injected intravenously. Full-ring PET imaging was performed from the skull base to thigh after the radiotracer. CT data was obtained and used for attenuation correction and anatomic localization. Fasting blood glucose: 74 mg/dl COMPARISON:  Chest CT with contrast 06/06/2023. Older exams as well. Chest x-ray 06/08/2023 FINDINGS: Mediastinal blood pool activity: SUV max 2.4 Liver activity: SUV max 2.4 NECK: Hypermetabolic lymph node seen to the right of the thyroid gland with maximum SUV value of 8.8. Lesion on image 35 of the CT scan series 202, measures 17 by 14 mm. No additional areas  of abnormal soft tissue uptake identified in the neck including submandibular or posterior triangle regions. No left-sided nodal hypermetabolic uptake. There is symmetric uptake of the visualized intracranial compartment. Incidental CT findings: Visualized paranasal sinuses and mastoid air cells are clear. Mild streak artifact related to the patient's dental hardware. The parotid glands, submandibular glands and thyroid gland is unremarkable. CHEST: Subtle areas of uptake seen along nodes in the mediastinum and left hilum. Focus of most intense uptake in the left lung hilum has maximum SUV value of 7.0.  Nodular thickening in this location on image 60. Nodal enlargement better seen on the prior contrast CT scan. Uptake in the mediastinum includes subcarinal, paratracheal and prevascular nodes. AP window lymph node. Uptake along the AP window node has maximum SUV of 4.8. Node seen on image 52. Additional area of uptake along right hilum with maximum SUV of 4.3. Patchy areas of uptake identified along left hemithorax in the left lower lobe with there is large area of consolidation. The central area of uptake has maximum SUV value of 5.6 diameter of this area on image 65 measures 2.2 cm. Distribution of the opacities similar to the prior contrast chest CT. There is also areas of confluent opacity seen in the lingula and right lower lobe. Areas of pleural effusion again seen including some loculated components on the left extending along the interlobar fissure towards the left lung apex. Incidental CT findings: Heart is nonenlarged. Coronary artery calcifications are seen. The thoracic aorta also has significant calcified plaque. Diffuse areas of interstitial septal thickening with some emphysematous changes and breathing motion. ABDOMEN/PELVIS: Some small foci of uptake seen in the liver. Most pronounced is seen in the right hepatic lobe with maximum SUV value of 4.8. This is seen on image 77 with diameter of uptake approaching 10 mm. Intrinsic liver lesion is possible. There is a low-density lesion seen on the prior CT scan in this location as well. There is also some uptake along both adrenal glands. More pronounced on the left with focal nodule. Maximum SUV value 6.3. Area measures on the prior CT scan with contrast at 15 by 18 mm. Physiologic uptake along the bowel and renal collecting systems. Incidental CT findings: To extensive vascular calcifications along the aorta and branch vessels. No obstructing renal or ureteral stones. Motion. The bowel is nondilated. Scattered colonic stool. Gallbladder is nondilated.  SKELETON: Extensive osseous metastatic disease identified with extensive areas of sclerosis. There are patchy areas identified including multiple ribs, bilateral scapula, right humerus, sternum, clavicles. Extensive areas along the spine and pelvis. Bilateral femurs are involved. Example areas of uptake include left hemi sacrum with maximum SUV value of 11.2. Maximum SUV value of 7.9 involving the anterior aspect of the left third rib. Right humeral head maximum SUV 5.1. Incidental CT findings: Scattered degenerative changes. IMPRESSION: Extensive metastatic disease identified. Extensive bony sclerosis throughout the skeleton with areas of increased uptake. Numerous abnormal lymph nodes identified involving the mediastinum, hilum as well as the right thoracic inlet, supraclavicular region. Metastatic foci involving the left adrenal gland and likely a focus in the right hepatic lobe of the liver. Loculated pleural effusion on the left and non loculated right. Persistent consolidative opacities identified greatest in the left lower lobe with a central area of increased uptake in the area of consolidation in left lower lobe. Additional area of disease is possible. Please correlate for primary neoplasm. Electronically Signed   By: Adrianna Horde M.D.   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 FINDINGS: Pelvis is intact with normal and symmetric sacroiliac joints. No acute fracture or dislocation. Redemonstration of extensive sclerotic lesions throughout the bones, compatible with patient's known history of metastasis. Visualized sacral arcuate lines are unremarkable. Unremarkable symphysis pubis. There are mild degenerative changes of bilateral hip joints without significant  joint space narrowing. Osteophytosis of the superior acetabulum. No radiopaque foreign bodies. IMPRESSION: No acute fracture or dislocation. Redemonstration of extensive sclerotic lesions throughout the bones, compatible with patient's known history of metastasis. Electronically Signed   By: Beula Brunswick M.D.   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 FINDINGS: Pelvis is intact with normal and symmetric sacroiliac joints. No acute fracture or dislocation. Redemonstration of extensive sclerotic lesions throughout the bones, compatible with patient's known history of metastasis. Visualized sacral arcuate lines are unremarkable. Unremarkable symphysis pubis. There are mild degenerative changes of bilateral hip joints without significant joint space narrowing. Osteophytosis of the superior acetabulum. No radiopaque foreign bodies. IMPRESSION: No acute fracture or dislocation. Redemonstration of extensive sclerotic lesions throughout the bones, compatible with patient's known history of metastasis. Electronically Signed   By: Beula Brunswick M.D.   On: 06/18/2023 14:44

## 2023-07-14 NOTE — TOC Transition Note (Signed)
 Transition of Care [facility]) - Discharge Note   Patient Details  Name: Curtis Flores MRN: [id number] Date of Birth: 1955/09/25  [facility] CM/SW Contact:  Ander Katos, LCSW Phone Number: 07/14/2023, 10:29 AM   Clinical Narrative: Pt d/c today. Jennifer with [facility] notified. Requested MD enter HHPT/OT/RN orders.       Final next level of care: Home w Home Health Services Barriers to Discharge: Barriers Resolved   Patient Goals and CMS Choice Patient states their goals for this hospitalization and ongoing recovery are:: return home   Choice offered to / list presented to : Patient [place] ownership interest in [facility].provided to::  (n/a)    Discharge Placement                       Discharge Plan and Services Additional resources added to the After Visit Summary for   In-house Referral: Clinical Social Work   Post Acute Care Choice: Home Health                    HH Arranged: RN, PT, OT [facility] Agency: [facility] Date [facility] Agency Contacted: 07/09/23 Time HH Agency Contacted: 0740 Representative spoke with at [facility] Agency: [facility]  Social Drivers of Health (SDOH) Interventions SDOH Screenings   Food Insecurity: No Food Insecurity (07/11/2023)  Housing: Low Risk  (07/11/2023)  Transportation Needs: No Transportation Needs (07/11/2023)  Utilities: Not At Risk (07/08/2023)  Depression (PHQ2-9): Low Risk  (03/09/2021)  Financial Resource Strain: Low Risk  (04/29/2023)   Received from [facility]  Physical Activity: Unknown (04/29/2023)   Received from [facility]  Social Connections: Moderately Integrated (07/08/2023)  Stress: No Stress Concern Present (04/29/2023)   Received from [facility]  Tobacco Use: Medium Risk (07/11/2023)     Readmission Risk Interventions    07/09/2023    8:05 AM 07/08/2023    9:44 PM 06/09/2023    4:41 PM  Readmission Risk Prevention Plan  Transportation Screening Complete Complete Complete   PCP or Specialist Appt within 5-7 Days  Complete   PCP or Specialist Appt within 3-5 Days   Complete  Home Care Screening  Complete   Medication Review (RN CM)  Complete   HRI or Home Care Consult Complete    Social Work Consult for Recovery Care Planning/Counseling Complete  Complete  Palliative Care Screening Not Applicable  Not Applicable  Medication Review [facility]) Complete  Referral to Pharmacy

## 2023-07-14 NOTE — Plan of Care (Signed)
  Problem: Education: Goal: Knowledge of General Education information will improve Description: Including pain rating scale, medication(s)/side effects and non-pharmacologic comfort measures Outcome: Progressing   Problem: Health Behavior/Discharge Planning: Goal: Ability to manage health-related needs will improve Outcome: Progressing   Problem: Clinical Measurements: Goal: Ability to maintain clinical measurements within normal limits will improve Outcome: Progressing   Problem: Elimination: Goal: Will not experience complications related to bowel motility Outcome: Progressing   Problem: Pain Managment: Goal: General experience of comfort will improve and/or be controlled Outcome: Progressing   Problem: Safety: Goal: Ability to remain free from injury will improve Outcome: Progressing   Problem: Skin Integrity: Goal: Risk for impaired skin integrity will decrease Outcome: Progressing

## 2023-07-14 NOTE — Plan of Care (Signed)

## 2023-07-14 NOTE — Care Management Important Message (Signed)
 Important Message  Patient Details  Name: Curtis Flores MRN: [id number] Date of Birth: March 03, 1956   Important Message Given:  Yes - Medicare IM     Sulo Janczak L Dala Breault 07/14/2023, 11:53 AM

## 2023-07-15 ENCOUNTER — Inpatient Hospital Stay: Admitting: Hematology

## 2023-07-15 ENCOUNTER — Other Ambulatory Visit: Payer: Self-pay | Admitting: Cardiovascular Disease

## 2023-07-17 ENCOUNTER — Ambulatory Visit (HOSPITAL_BASED_OUTPATIENT_CLINIC_OR_DEPARTMENT_OTHER): Admitting: Pulmonary Disease

## 2023-07-17 ENCOUNTER — Encounter (HOSPITAL_BASED_OUTPATIENT_CLINIC_OR_DEPARTMENT_OTHER): Payer: Self-pay

## 2023-07-19 ENCOUNTER — Other Ambulatory Visit: Payer: Self-pay | Admitting: Hematology

## 2023-07-22 ENCOUNTER — Inpatient Hospital Stay: Admitting: Hematology

## 2023-07-23 ENCOUNTER — Telehealth (HOSPITAL_COMMUNITY): Payer: Self-pay | Admitting: Cardiology

## 2023-07-23 NOTE — Telephone Encounter (Signed)
 Returned call Wife reports they will report to ER 5/22 after cancer appt

## 2023-07-23 NOTE — Telephone Encounter (Signed)
 Pt daughter called lvm regarding new chf gaining 7 pounds, will be seen by Dr Alease Amend on 06/06, she would like a return call

## 2023-07-24 ENCOUNTER — Encounter (HOSPITAL_COMMUNITY): Payer: Self-pay

## 2023-07-24 ENCOUNTER — Other Ambulatory Visit: Payer: Self-pay

## 2023-07-24 ENCOUNTER — Inpatient Hospital Stay (HOSPITAL_COMMUNITY)
Admission: EM | Admit: 2023-07-24 | Discharge: 2023-08-03 | DRG: 871 | Disposition: E | Attending: Internal Medicine | Admitting: Internal Medicine

## 2023-07-24 ENCOUNTER — Other Ambulatory Visit: Payer: Self-pay | Admitting: *Deleted

## 2023-07-24 ENCOUNTER — Inpatient Hospital Stay: Attending: Hematology | Admitting: Hematology

## 2023-07-24 ENCOUNTER — Emergency Department (HOSPITAL_COMMUNITY)

## 2023-07-24 VITALS — BP 111/64 | HR 90 | Temp 99.2°F | Resp 20 | Wt 164.0 lb

## 2023-07-24 DIAGNOSIS — C3490 Malignant neoplasm of unspecified part of unspecified bronchus or lung: Secondary | ICD-10-CM | POA: Diagnosis present

## 2023-07-24 DIAGNOSIS — J8410 Pulmonary fibrosis, unspecified: Secondary | ICD-10-CM | POA: Diagnosis present

## 2023-07-24 DIAGNOSIS — M069 Rheumatoid arthritis, unspecified: Secondary | ICD-10-CM | POA: Diagnosis present

## 2023-07-24 DIAGNOSIS — I10 Essential (primary) hypertension: Secondary | ICD-10-CM | POA: Diagnosis present

## 2023-07-24 DIAGNOSIS — Z8546 Personal history of malignant neoplasm of prostate: Secondary | ICD-10-CM

## 2023-07-24 DIAGNOSIS — I255 Ischemic cardiomyopathy: Secondary | ICD-10-CM | POA: Diagnosis present

## 2023-07-24 DIAGNOSIS — J90 Pleural effusion, not elsewhere classified: Secondary | ICD-10-CM | POA: Diagnosis not present

## 2023-07-24 DIAGNOSIS — J9611 Chronic respiratory failure with hypoxia: Secondary | ICD-10-CM | POA: Diagnosis present

## 2023-07-24 DIAGNOSIS — J189 Pneumonia, unspecified organism: Secondary | ICD-10-CM | POA: Diagnosis present

## 2023-07-24 DIAGNOSIS — I480 Paroxysmal atrial fibrillation: Secondary | ICD-10-CM | POA: Diagnosis present

## 2023-07-24 DIAGNOSIS — J9819 Other pulmonary collapse: Secondary | ICD-10-CM | POA: Diagnosis present

## 2023-07-24 DIAGNOSIS — M8448XA Pathological fracture, other site, initial encounter for fracture: Secondary | ICD-10-CM | POA: Diagnosis present

## 2023-07-24 DIAGNOSIS — R0602 Shortness of breath: Secondary | ICD-10-CM | POA: Diagnosis present

## 2023-07-24 DIAGNOSIS — L89322 Pressure ulcer of left buttock, stage 2: Secondary | ICD-10-CM | POA: Diagnosis present

## 2023-07-24 DIAGNOSIS — Z515 Encounter for palliative care: Secondary | ICD-10-CM

## 2023-07-24 DIAGNOSIS — I110 Hypertensive heart disease with heart failure: Secondary | ICD-10-CM | POA: Diagnosis present

## 2023-07-24 DIAGNOSIS — J851 Abscess of lung with pneumonia: Secondary | ICD-10-CM

## 2023-07-24 DIAGNOSIS — Z87891 Personal history of nicotine dependence: Secondary | ICD-10-CM

## 2023-07-24 DIAGNOSIS — K219 Gastro-esophageal reflux disease without esophagitis: Secondary | ICD-10-CM | POA: Diagnosis not present

## 2023-07-24 DIAGNOSIS — C801 Malignant (primary) neoplasm, unspecified: Secondary | ICD-10-CM | POA: Diagnosis not present

## 2023-07-24 DIAGNOSIS — J850 Gangrene and necrosis of lung: Secondary | ICD-10-CM | POA: Diagnosis present

## 2023-07-24 DIAGNOSIS — M8458XA Pathological fracture in neoplastic disease, other specified site, initial encounter for fracture: Secondary | ICD-10-CM

## 2023-07-24 DIAGNOSIS — J80 Acute respiratory distress syndrome: Secondary | ICD-10-CM | POA: Diagnosis present

## 2023-07-24 DIAGNOSIS — Z7901 Long term (current) use of anticoagulants: Secondary | ICD-10-CM

## 2023-07-24 DIAGNOSIS — R6520 Severe sepsis without septic shock: Secondary | ICD-10-CM | POA: Diagnosis present

## 2023-07-24 DIAGNOSIS — E782 Mixed hyperlipidemia: Secondary | ICD-10-CM | POA: Diagnosis not present

## 2023-07-24 DIAGNOSIS — Z8249 Family history of ischemic heart disease and other diseases of the circulatory system: Secondary | ICD-10-CM

## 2023-07-24 DIAGNOSIS — R1310 Dysphagia, unspecified: Secondary | ICD-10-CM | POA: Diagnosis present

## 2023-07-24 DIAGNOSIS — I3139 Other pericardial effusion (noninflammatory): Secondary | ICD-10-CM | POA: Diagnosis present

## 2023-07-24 DIAGNOSIS — Z79899 Other long term (current) drug therapy: Secondary | ICD-10-CM

## 2023-07-24 DIAGNOSIS — Z7189 Other specified counseling: Secondary | ICD-10-CM | POA: Diagnosis not present

## 2023-07-24 DIAGNOSIS — I4892 Unspecified atrial flutter: Secondary | ICD-10-CM | POA: Diagnosis present

## 2023-07-24 DIAGNOSIS — Z79624 Long term (current) use of inhibitors of nucleotide synthesis: Secondary | ICD-10-CM

## 2023-07-24 DIAGNOSIS — Y95 Nosocomial condition: Secondary | ICD-10-CM | POA: Diagnosis present

## 2023-07-24 DIAGNOSIS — I252 Old myocardial infarction: Secondary | ICD-10-CM

## 2023-07-24 DIAGNOSIS — M05742 Rheumatoid arthritis with rheumatoid factor of left hand without organ or systems involvement: Secondary | ICD-10-CM | POA: Diagnosis not present

## 2023-07-24 DIAGNOSIS — Z8701 Personal history of pneumonia (recurrent): Secondary | ICD-10-CM

## 2023-07-24 DIAGNOSIS — C787 Secondary malignant neoplasm of liver and intrahepatic bile duct: Secondary | ICD-10-CM | POA: Diagnosis present

## 2023-07-24 DIAGNOSIS — L4050 Arthropathic psoriasis, unspecified: Secondary | ICD-10-CM | POA: Diagnosis present

## 2023-07-24 DIAGNOSIS — A419 Sepsis, unspecified organism: Principal | ICD-10-CM | POA: Diagnosis present

## 2023-07-24 DIAGNOSIS — I5023 Acute on chronic systolic (congestive) heart failure: Secondary | ICD-10-CM | POA: Diagnosis present

## 2023-07-24 DIAGNOSIS — E871 Hypo-osmolality and hyponatremia: Secondary | ICD-10-CM | POA: Diagnosis present

## 2023-07-24 DIAGNOSIS — C771 Secondary and unspecified malignant neoplasm of intrathoracic lymph nodes: Secondary | ICD-10-CM | POA: Diagnosis present

## 2023-07-24 DIAGNOSIS — D84821 Immunodeficiency due to drugs: Secondary | ICD-10-CM | POA: Diagnosis present

## 2023-07-24 DIAGNOSIS — C7951 Secondary malignant neoplasm of bone: Secondary | ICD-10-CM | POA: Diagnosis present

## 2023-07-24 DIAGNOSIS — Z555 Less than a high school diploma: Secondary | ICD-10-CM

## 2023-07-24 DIAGNOSIS — I2510 Atherosclerotic heart disease of native coronary artery without angina pectoris: Secondary | ICD-10-CM | POA: Diagnosis present

## 2023-07-24 DIAGNOSIS — J9690 Respiratory failure, unspecified, unspecified whether with hypoxia or hypercapnia: Secondary | ICD-10-CM | POA: Insufficient documentation

## 2023-07-24 DIAGNOSIS — C7972 Secondary malignant neoplasm of left adrenal gland: Secondary | ICD-10-CM | POA: Diagnosis present

## 2023-07-24 DIAGNOSIS — Z66 Do not resuscitate: Secondary | ICD-10-CM | POA: Diagnosis not present

## 2023-07-24 DIAGNOSIS — L89312 Pressure ulcer of right buttock, stage 2: Secondary | ICD-10-CM | POA: Diagnosis present

## 2023-07-24 DIAGNOSIS — Z9981 Dependence on supplemental oxygen: Secondary | ICD-10-CM

## 2023-07-24 DIAGNOSIS — M05741 Rheumatoid arthritis with rheumatoid factor of right hand without organ or systems involvement: Secondary | ICD-10-CM | POA: Diagnosis not present

## 2023-07-24 DIAGNOSIS — Z7902 Long term (current) use of antithrombotics/antiplatelets: Secondary | ICD-10-CM

## 2023-07-24 LAB — URINALYSIS, W/ REFLEX TO CULTURE (INFECTION SUSPECTED)
Bacteria, UA: NONE SEEN
Bilirubin Urine: NEGATIVE
Glucose, UA: NEGATIVE mg/dL
Hgb urine dipstick: NEGATIVE
Ketones, ur: NEGATIVE mg/dL
Leukocytes,Ua: NEGATIVE
Nitrite: NEGATIVE
Protein, ur: 100 mg/dL — AB
Specific Gravity, Urine: 1.014 (ref 1.005–1.030)
pH: 6 (ref 5.0–8.0)

## 2023-07-24 LAB — CBC WITH DIFFERENTIAL/PLATELET
Abs Immature Granulocytes: 0.22 10*3/uL — ABNORMAL HIGH (ref 0.00–0.07)
Basophils Absolute: 0 10*3/uL (ref 0.0–0.1)
Basophils Relative: 0 %
Eosinophils Absolute: 0.1 10*3/uL (ref 0.0–0.5)
Eosinophils Relative: 1 %
HCT: 35.7 % — ABNORMAL LOW (ref 39.0–52.0)
Hemoglobin: 11.8 g/dL — ABNORMAL LOW (ref 13.0–17.0)
Immature Granulocytes: 1 %
Lymphocytes Relative: 8 %
Lymphs Abs: 1.4 10*3/uL (ref 0.7–4.0)
MCH: 28.3 pg (ref 26.0–34.0)
MCHC: 33.1 g/dL (ref 30.0–36.0)
MCV: 85.6 fL (ref 80.0–100.0)
Monocytes Absolute: 2.1 10*3/uL — ABNORMAL HIGH (ref 0.1–1.0)
Monocytes Relative: 12 %
Neutro Abs: 13.7 10*3/uL — ABNORMAL HIGH (ref 1.7–7.7)
Neutrophils Relative %: 78 %
Platelets: 288 10*3/uL (ref 150–400)
RBC: 4.17 MIL/uL — ABNORMAL LOW (ref 4.22–5.81)
RDW: 16.9 % — ABNORMAL HIGH (ref 11.5–15.5)
WBC: 17.6 10*3/uL — ABNORMAL HIGH (ref 4.0–10.5)
nRBC: 0 % (ref 0.0–0.2)

## 2023-07-24 LAB — COMPREHENSIVE METABOLIC PANEL WITH GFR
ALT: 40 U/L (ref 0–44)
AST: 51 U/L — ABNORMAL HIGH (ref 15–41)
Albumin: 2.5 g/dL — ABNORMAL LOW (ref 3.5–5.0)
Alkaline Phosphatase: 197 U/L — ABNORMAL HIGH (ref 38–126)
Anion gap: 11 (ref 5–15)
BUN: 15 mg/dL (ref 8–23)
CO2: 34 mmol/L — ABNORMAL HIGH (ref 22–32)
Calcium: 8.7 mg/dL — ABNORMAL LOW (ref 8.9–10.3)
Chloride: 76 mmol/L — ABNORMAL LOW (ref 98–111)
Creatinine, Ser: 0.44 mg/dL — ABNORMAL LOW (ref 0.61–1.24)
GFR, Estimated: >60 mL/min (ref >60)
Glucose, Bld: 79 mg/dL (ref 70–99)
Potassium: 4.3 mmol/L (ref 3.5–5.1)
Sodium: 121 mmol/L — ABNORMAL LOW (ref 135–145)
Total Bilirubin: 0.9 mg/dL (ref 0.0–1.2)
Total Protein: 6.3 g/dL — ABNORMAL LOW (ref 6.5–8.1)

## 2023-07-24 LAB — LACTIC ACID, PLASMA: Lactic Acid, Venous: 1.5 mmol/L (ref 0.5–1.9)

## 2023-07-24 LAB — BRAIN NATRIURETIC PEPTIDE: B Natriuretic Peptide: 325 pg/mL — ABNORMAL HIGH (ref 0.0–100.0)

## 2023-07-24 LAB — PROTIME-INR
INR: 1.6 — ABNORMAL HIGH (ref 0.8–1.2)
Prothrombin Time: 19.3 s — ABNORMAL HIGH (ref 11.4–15.2)

## 2023-07-24 MED ORDER — VANCOMYCIN HCL 1750 MG/350ML IV SOLN
1750.0000 mg | Freq: Once | INTRAVENOUS | Status: AC
  Administered 2023-07-24: 1750 mg via INTRAVENOUS
  Filled 2023-07-24: qty 350

## 2023-07-24 MED ORDER — IOHEXOL 300 MG/ML  SOLN
75.0000 mL | Freq: Once | INTRAMUSCULAR | Status: AC | PRN
  Administered 2023-07-24: 75 mL via INTRAVENOUS

## 2023-07-24 MED ORDER — ALBUTEROL SULFATE (2.5 MG/3ML) 0.083% IN NEBU: 2.5000 mg | INHALATION_SOLUTION | RESPIRATORY_TRACT | Status: DC | PRN

## 2023-07-24 MED ORDER — FUROSEMIDE 10 MG/ML IJ SOLN
40.0000 mg | Freq: Once | INTRAMUSCULAR | Status: AC
  Administered 2023-07-24: 40 mg via INTRAVENOUS
  Filled 2023-07-24: qty 4

## 2023-07-24 MED ORDER — OXYCODONE HCL 5 MG PO TABS: 5.0000 mg | ORAL_TABLET | Freq: Three times a day (TID) | ORAL | 0 refills | Status: DC | PRN

## 2023-07-24 MED ORDER — IPRATROPIUM-ALBUTEROL 0.5-2.5 (3) MG/3ML IN SOLN
3.0000 mL | Freq: Four times a day (QID) | RESPIRATORY_TRACT | Status: DC
  Administered 2023-07-24: 3 mL via RESPIRATORY_TRACT
  Filled 2023-07-24: qty 3

## 2023-07-24 MED ORDER — PANTOPRAZOLE SODIUM 40 MG PO TBEC
40.0000 mg | DELAYED_RELEASE_TABLET | Freq: Two times a day (BID) | ORAL | Status: DC
  Administered 2023-07-25 – 2023-07-28 (×7): 40 mg via ORAL
  Filled 2023-07-24 (×7): qty 1

## 2023-07-24 MED ORDER — ENSURE ENLIVE PO LIQD
237.0000 mL | Freq: Two times a day (BID) | ORAL | Status: DC
  Administered 2023-07-25 – 2023-07-27 (×5): 237 mL via ORAL

## 2023-07-24 MED ORDER — FUROSEMIDE 10 MG/ML IJ SOLN
80.0000 mg | Freq: Two times a day (BID) | INTRAMUSCULAR | Status: DC
  Administered 2023-07-24 – 2023-07-28 (×8): 80 mg via INTRAVENOUS
  Filled 2023-07-24 (×8): qty 8

## 2023-07-24 MED ORDER — ATORVASTATIN CALCIUM 40 MG PO TABS: 80.0000 mg | ORAL_TABLET | Freq: Every day | ORAL | Status: DC

## 2023-07-24 MED ORDER — TORSEMIDE 20 MG PO TABS
40.0000 mg | ORAL_TABLET | Freq: Every day | ORAL | Status: DC
  Filled 2023-07-24 (×2): qty 2

## 2023-07-24 MED ORDER — FERROUS SULFATE 325 (65 FE) MG PO TABS
325.0000 mg | ORAL_TABLET | Freq: Every day | ORAL | Status: DC
  Administered 2023-07-25 – 2023-07-28 (×4): 325 mg via ORAL
  Filled 2023-07-24 (×4): qty 1

## 2023-07-24 MED ORDER — VANCOMYCIN HCL 2000 MG/400ML IV SOLN: 2000.0000 mg | INTRAVENOUS | Status: DC

## 2023-07-24 MED ORDER — OXYCODONE HCL 5 MG PO TABS
5.0000 mg | ORAL_TABLET | Freq: Three times a day (TID) | ORAL | Status: DC | PRN
  Administered 2023-07-24 – 2023-07-27 (×4): 5 mg via ORAL
  Filled 2023-07-24 (×4): qty 1

## 2023-07-24 MED ORDER — ONDANSETRON HCL 4 MG/2ML IJ SOLN
4.0000 mg | Freq: Four times a day (QID) | INTRAMUSCULAR | Status: DC | PRN
  Administered 2023-07-26: 4 mg via INTRAVENOUS
  Filled 2023-07-24: qty 2

## 2023-07-24 MED ORDER — ENOXAPARIN SODIUM 40 MG/0.4ML IJ SOSY
40.0000 mg | PREFILLED_SYRINGE | INTRAMUSCULAR | Status: DC
  Administered 2023-07-24 – 2023-07-27 (×4): 40 mg via SUBCUTANEOUS
  Filled 2023-07-24 (×4): qty 0.4

## 2023-07-24 MED ORDER — ACETAMINOPHEN 325 MG PO TABS: 650.0000 mg | ORAL_TABLET | Freq: Four times a day (QID) | ORAL | Status: DC | PRN

## 2023-07-24 MED ORDER — PIPERACILLIN-TAZOBACTAM 3.375 G IVPB
3.3750 g | Freq: Three times a day (TID) | INTRAVENOUS | Status: DC
  Administered 2023-07-24 – 2023-07-28 (×12): 3.375 g via INTRAVENOUS
  Filled 2023-07-24 (×12): qty 50

## 2023-07-24 MED ORDER — IPRATROPIUM-ALBUTEROL 0.5-2.5 (3) MG/3ML IN SOLN
3.0000 mL | Freq: Three times a day (TID) | RESPIRATORY_TRACT | Status: DC
  Administered 2023-07-25 – 2023-07-28 (×12): 3 mL via RESPIRATORY_TRACT
  Filled 2023-07-24 (×11): qty 3

## 2023-07-24 MED ORDER — ACETAMINOPHEN 650 MG RE SUPP: 650.0000 mg | Freq: Four times a day (QID) | RECTAL | Status: DC | PRN

## 2023-07-24 MED ORDER — CHLORHEXIDINE GLUCONATE CLOTH 2 % EX PADS
6.0000 | MEDICATED_PAD | Freq: Every day | CUTANEOUS | Status: DC
  Administered 2023-07-24 – 2023-07-25 (×2): 6 via TOPICAL

## 2023-07-24 MED ORDER — DOCUSATE SODIUM 100 MG PO CAPS
100.0000 mg | ORAL_CAPSULE | Freq: Every day | ORAL | Status: DC
  Administered 2023-07-24 – 2023-07-28 (×5): 100 mg via ORAL
  Filled 2023-07-24 (×5): qty 1

## 2023-07-24 MED ORDER — ONDANSETRON HCL 4 MG PO TABS
4.0000 mg | ORAL_TABLET | Freq: Four times a day (QID) | ORAL | Status: DC | PRN
Start: 2023-07-24 — End: 2023-07-28

## 2023-07-24 MED ORDER — METOPROLOL SUCCINATE ER 50 MG PO TB24: 50.0000 mg | ORAL_TABLET | Freq: Two times a day (BID) | ORAL | Status: DC

## 2023-07-24 NOTE — Patient Instructions (Signed)
 [facility] Discharge Instructions   You were seen and examined today by Dr. Cheree Cords.  He reviewed the results of the testing we have done on your blood and tissue. Neither test shows any mutations to target. The only route for treatment would be traditional chemotherapy, which your body cannot handle at this time. Dr. Linnell Richardson does not recommend any treatment for the cancer. He suggests you focus on your symptoms coming from your heart issues and just be comfortable in your home.   We will make a referral to palliative care/hospice. They will come to your home and assess you, and provide you assistance on an as needed basis. They will also help with pain management and control.       Thank you for choosing [facility] at [facility] to provide your oncology and hematology care.  To afford each patient quality time with our provider, please arrive at least 15 minutes before your scheduled appointment time.   If you have a lab appointment with the Cancer Center please come in thru the Main Entrance and check in at the main information desk.  You need to re-schedule your appointment should you arrive 10 or more minutes late.  We strive to give you quality time with our providers, and arriving late affects you and other patients whose appointments are after yours.  Also, if you no show three or more times for appointments you may be dismissed from the clinic at the providers discretion.     Again, thank you for choosing [facility].  Our hope is that these requests will decrease the amount of time that you wait before being seen by our physicians.       _____________________________________________________________  Should you have questions after your visit to [facility], please contact our office at [phone] and follow the prompts.  Our office hours are 8:00 a.m. and 4:30 p.m. Monday - Friday.  Please note that  voicemails left after 4:00 p.m. may not be returned until the following business day.  We are closed weekends and major holidays.  You do have access to a nurse 24-7, just call the main number to the clinic [phone] and do not press any options, hold on the line and a nurse will answer the phone.    For prescription refill requests, have your pharmacy contact our office and allow 72 hours.    Due to Covid, you will need to wear a mask upon entering the hospital. If you do not have a mask, a mask will be given to you at the Main Entrance upon arrival. For doctor visits, patients may have 1 support person age 81 or older with them. For treatment visits, patients can not have anyone with them due to social distancing guidelines and our immunocompromised population.

## 2023-07-24 NOTE — Progress Notes (Signed)
 [facility] [address], [place] [address]   Clinic Day:  07/24/2023  Referring physician: [name], PA-C  Patient Care Team: [name], PA-C as PCP - General (Physician Assistant) Avanell Leigh, MD as PCP - Cardiology (Cardiology) Lind Repine, MD as Consulting Physician (Pulmonary Disease) Nicholas Bari, MD as Consulting Physician (Rheumatology) Gerda Knows, MD as Attending Physician (Family Medicine) Riley Cheadle Windsor Hatcher, MD as Consulting Physician (Gastroenterology) Paulett Boros, MD as Medical Oncologist (Medical Oncology) Gerhard Knuckles, RN as Oncology Nurse Navigator (Medical Oncology)   ASSESSMENT & PLAN:   Assessment:  1.  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  IHC positive for Napsin A, TTF-1 and CK7.  Negative for Prostein, PSA, CK20, CDX2 and calretinin. - CT chest (06/06/2023): Bilateral pleural effusions, left more than right.  Persistent fibrotic change and left lower lobe consolidation.  Extensive sclerotic osseous lesions.  Hilar and mediastinal nodes measuring up to 12 mm (subcarinal). - CTAP (06/12/2023): Diffuse sclerotic bony metastasis with extensive involvement of bilateral hips.  Indeterminate left adrenal lesion.  Indeterminate subcentimeter hypodensity within the right lobe of the liver, too small to characterize.  2.  Social/family history: - Lives at home with his wife.  On O2 nasal cannula since discharge from the hospital on 06/12/2023.  Quit smoking 20 years ago.  Smoked half pack per day for 20 years.  No asbestos or chemical exposure. - No family history of malignancy.   Plan:  1.  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  He has gained about 9 pounds and has difficulty breathing.  He will get evaluated in the ER. - I discussed and reviewed images of the PET scan with the patient and his family in detail.  Consistent with metastatic lung cancer spread to the bones, liver and lymph nodes. - We reviewed results of [employer] and tissue NGS tests.  He does not have any targetable mutations.  Options include chemoimmunotherapy with carboplatin, pemetrexed and pembrolizumab or combination immunotherapy with nivolumab and ipilimumab. - Overall his functional status is not conducive to receive any aggressive treatment for his metastatic lung cancer.  I have discussed and recommended best supportive care in the form of hospice.  Will make a referral.  2.  Extensive bone metastasis: - He has pain in the left lower back, likely from metastatic disease.  Right femur neck lesion present. - Will start him on oxycodone  5 mg every 8 hours as needed.  3.  Sleeplessness: - Continue trazodone  50 mg at bedtime as needed.     No orders of the defined types were placed in this encounter.     Nadeen Augusta Teague,acting as a Neurosurgeon for Paulett Boros, MD.,have documented all relevant documentation on the behalf of Paulett Boros, MD,as directed by  Paulett Boros, MD while in the presence of Paulett Boros, MD.  I, Paulett Boros MD, have reviewed the above documentation for accuracy and completeness, and I agree with the above.    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  Prior Therapy: None  Current Therapy: Under workup   HISTORY OF PRESENT ILLNESS:   Oncology History   No history exists.      Travin is a 68 y.o. 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   Patient was admitted to the hospital from 06/07/23 to 06/13/23 for pneumonia after previously being diagnosed with lower lobe pneumonia on 04/19/23 s/p Levaquin  course. Micheil  was treated with cefepime , azithromycin , and solu-medrol  transitioned to prednisone  during hospitalization. Imaging showed bilateral pleural effusions, left greater than right. He underwent left thoracentesis on 06/07/23, draining 250 mL of amber-colored fluid with left lung collapse. Cytology of pleural fluid showed: adenocarcinoma with malignant cells positive for Napsin A, TTF-1 and cytokeratin 7.   CT chest high resolution from 05/26/23 found: Pulmonary parenchymal pattern of fibrosis, as detailed above, grossly similar to prior exams. Persistent superimposed patchy ground-glass and consolidation, progressive in the left lower lobe, worrisome for occult malignancy. Highly loculated moderate left pleural effusion in simple right pleural effusion. Diffuse osseous metastatic disease, suggestive of prostate cancer. Aortic atherosclerosis. Coronary artery calcification. Enlarged pulmonic trunk, indicative of pulmonary arterial hypertension.  CT A/P from 06/12/23 showed: Diffuse sclerotic bony metastases as above. Extensive involvement of the bilateral hips may place the patient at risk for fracture. In a male patient of this age, prostate cancer is the diagnosis of exclusion. Indeterminate left adrenal lesion, new since 10/21/2021 chest CT. Metastatic disease cannot be excluded. Indeterminate subcentimeter hypodensity within the right lobe liver, too small to characterize. Aortic Atherosclerosis.   Today, he states that he is doing well overall.  His appetite level is at 100%. His energy level is at 50%.  INTERVAL HISTORY:   SNEIJDER BERNARDS is a 68 y.o. 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 He was last seen by me on 06/18/23 in consultation.  Since his last visit, he underwent initial PET on 06/26/23 that found: Extensive metastatic disease identified. Extensive bony sclerosis throughout the skeleton with areas of increased uptake. Numerous abnormal lymph nodes identified involving the mediastinum, hilum as well as the right thoracic inlet, supraclavicular region. Metastatic foci involving the left adrenal gland and likely a focus in the right hepatic lobe of the liver. Loculated pleural effusion on the left and non loculated right. Persistent consolidative opacities identified greatest in the left lower lobe with a central area of increased uptake in the area of consolidation in left lower lobe. Additional area of disease is possible.  Dayson was admitted to the hospital from 07/08/23 to 07/14/23 for acute systolic and diastolic CHF and respiratory failure.   Today, he states that he is doing well overall. His appetite level is at 10%. His energy level is at 10%.   PAST MEDICAL HISTORY:   Past Medical History: Past Medical History:  Diagnosis Date   Abnormal CT scan, stomach    Chronic systolic dysfunction of left ventricle    Coronary artery disease    lateral STEMI 02/22/15 3/10 cutting balloon to ISR of the o/pLCX   GERD (gastroesophageal reflux disease)    Hyperlipidemia    Hypertension    Ischemic cardiomyopathy    LBBB (left bundle branch block)    Leukocytosis  followed by hematology, reactive   Lymphadenopathy    Myocardial infarction (HCC) 02/2015   Psoriasis 2003   Psoriatic arthritis (HCC)    Pulmonary fibrosis (HCC)    Rheumatoid arthritis(714.0) 2012   Typical atrial flutter [facility])     Surgical History: Past Surgical  History:  Procedure Laterality Date   CARDIAC CATHETERIZATION N/A 02/22/2015   Procedure: Left Heart Cath and Coronary Angiography;  Surgeon: Avanell Leigh, MD;  Location: [facility] INVASIVE CV LAB;  Service: Cardiovascular;  Laterality: N/A;   CARDIAC CATHETERIZATION N/A 02/22/2015   Procedure: Coronary Stent Intervention;  Surgeon: Avanell Leigh, MD;  Location: [facility] INVASIVE CV LAB;  Service: Cardiovascular;  Laterality: N/A;   CARDIOVERSION N/A 04/12/2015   Procedure: CARDIOVERSION;  Surgeon: Darlis Eisenmenger, MD;  Location: [facility] ENDOSCOPY;  Service: Cardiovascular;  Laterality: N/A;   COLONOSCOPY  07/01/2003   WUJ:WJXBJY colonic mucosa except for the proximal right colon in the area of ICV which was not seen completely due to inadequate bowel prep. followed with ACBE which was normal.    COLONOSCOPY N/A 08/24/2015   Dr. Riley Cheadle: Normal colon. Next colonoscopy in 10 years.   CORONARY BALLOON ANGIOPLASTY N/A 05/12/2018   Procedure: CORONARY BALLOON ANGIOPLASTY;  Surgeon: Arty Binning, MD;  Location: [facility] INVASIVE CV LAB;  Service: Cardiovascular;  Laterality: N/A;   ELECTROPHYSIOLOGIC STUDY N/A 05/30/2015   Atrial fibrillation ablation by Dr Nunzio Belch   ESOPHAGOGASTRODUODENOSCOPY N/A 08/24/2015   Dr. Riley Cheadle: Medium-sized hiatal hernia, erosive gastropathy. Cameron lesions. Esophageal mucosa distally suggestive of short segment Barrett's esophagus. Not confirmed on biopsy. Gastric biopsy with minimal chronic inflammation   GIVENS CAPSULE STUDY N/A 04/17/2016   Procedure: GIVENS CAPSULE STUDY;  Surgeon: Suzette Espy, MD;  Location: [facility] ENDO SUITE;  Service: Endoscopy;  Laterality: N/A;  Pt to arrive at 8:00 am for 8:30 am appt   LEFT HEART CATH AND CORONARY ANGIOGRAPHY N/A 05/11/2018   Procedure: LEFT HEART CATH AND CORONARY ANGIOGRAPHY;  Surgeon: Arty Binning, MD;  Location: [facility] INVASIVE CV LAB;  Service: Cardiovascular;  Laterality: N/A;   TEE WITHOUT CARDIOVERSION N/A 04/12/2015   Procedure: TRANSESOPHAGEAL  ECHOCARDIOGRAM (TEE);  Surgeon: Darlis Eisenmenger, MD;  Location: [facility] ENDOSCOPY;  Service: Cardiovascular;  Laterality: N/A;    Social History: Social History   Socioeconomic History   Marital status: Married    Spouse name: Adell Hones   Number of children: 2   Years of education: Not on file   Highest education level: 9th grade  Occupational History   Occupation: unemployed    Comment: not working do to arthritis; used to be Production designer, 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   Drug use: No    Comment: H/O marijuana use many years ago.   Sexual activity: Yes    Birth control/protection: None  Other Topics Concern   Not on file  Social History Narrative  Not on file   Social Drivers of Health   Financial Resource Strain: Low Risk  (04/29/2023)   [facility] from [facility]   Overall Financial Resource Strain (CARDIA)    Difficulty of Paying Living Expenses: Not very hard  Food Insecurity: No Food Insecurity (07/11/2023)   Hunger Vital Sign    Worried About Running Out of Food in the Last Year: Never true    Ran Out of Food in the Last Year: Never true  Transportation Needs: No Transportation Needs (07/11/2023)   PRAPARE - Administrator, Civil Service (Medical): No    Lack of Transportation (Non-Medical): No  Physical Activity: Unknown (04/29/2023)   Received from [facility]   Exercise Vital Sign    Days of Exercise per Week: 0 days    Minutes of Exercise per Session: Not on file   Stress: No Stress Concern Present (04/29/2023)   Received from [facility] - Occupational Stress Questionnaire    Feeling of Stress : Not at all  Social Connections: Moderately Integrated (07/08/2023)   Social Connection and Isolation Panel [NHANES]    Frequency of Communication with Friends and Family: Once a week    Frequency of Social Gatherings with Friends and Family: Once a week    Attends Religious Services: 1 to 4 times per year    Active Member of [employer] or Organizations: Yes    Attends Banker Meetings: Never    Marital Status: Married  Catering manager Violence: Not At Risk (07/08/2023)   Humiliation, Afraid, Rape, and Kick questionnaire    Fear of Current or Ex-Partner: No    Emotionally Abused: No    Physically Abused: No    Sexually Abused: No    Family History: Family History  Problem Relation Age of Onset   Hypertension Mother    Colon cancer Neg Hx     Current Medications:  Current Outpatient Medications:    amLODipine  (NORVASC ) 5 MG tablet, Take 1 tablet (5 mg total) by mouth daily., Disp: 90 tablet, Rfl: 3   apixaban  (ELIQUIS ) 5 MG TABS tablet, TAKE 1 TABLET TWICE DAILY, Disp: 180 tablet, Rfl: 1   atorvastatin  (LIPITOR ) 80 MG tablet, TAKE 1 TABLET EVERY DAY AT 6 PM, Disp: 90 tablet, Rfl: 2   azaTHIOprine  (IMURAN ) 50 MG tablet, TAKE 3 TABLETS EVERY DAY, Disp: 270 tablet, Rfl: 0   clobetasol  (TEMOVATE ) 0.05 % external solution, Apply 1 Application topically 2 (two) times daily. (Patient taking differently: Apply 1 Application topically 2 (two) times daily as needed (Dermatitis).), Disp: 50 mL, Rfl: 0   Clobetasol  Prop Emollient Base (CLOBETASOL  PROPIONATE E) 0.05 % emollient cream, APPLY TO AFFECTED AREA TWICE A DAY AS NEEDED, Disp: 30 g, Rfl: 2   clopidogrel  (PLAVIX ) 75 MG tablet, Take 1 tablet (75 mg total) by mouth daily., Disp: 90 tablet, Rfl: 3   docusate sodium  (COLACE) 100 MG capsule, Take 1 capsule  (100 mg total) by mouth daily., Disp: 30 capsule, Rfl: 0   feeding supplement (ENSURE ENLIVE / ENSURE PLUS) LIQD, Take 237 mLs by mouth 2 (two) times daily between meals., Disp: , Rfl:    ferrous sulfate  325 (65 FE) MG tablet, Take 1 tablet (325 mg total) by mouth daily with breakfast., Disp: 30 tablet, Rfl: 3   losartan  (COZAAR ) 25 MG tablet, Take 1 tablet (25 mg total) by mouth daily., Disp: 30 tablet, Rfl: 1   methylPREDNISolone  (MEDROL  DOSEPAK) 4 MG TBPK tablet,  Medrol  Dosepak take as instructed, Disp: 21 tablet, Rfl: 0   metoprolol  succinate (TOPROL -XL) 50 MG 24 hr tablet, Take 1 tablet (50 mg total) by mouth 2 (two) times daily., Disp: 60 tablet, Rfl: 1   pantoprazole  (PROTONIX ) 40 MG tablet, TAKE 1 TABLET TWICE DAILY, Disp: 180 tablet, Rfl: 1   potassium chloride  SA (KLOR-CON  M) 20 MEQ tablet, TAKE 2 TABLETS TWICE DAILY, Disp: 360 tablet, Rfl: 3   torsemide  40 MG TABS, Take 40 mg by mouth daily., Disp: 30 tablet, Rfl: 1   Allergies: No Known Allergies  REVIEW OF SYSTEMS:   Review of Systems  Constitutional:  Positive for fatigue. Negative for chills and fever.  HENT:   Positive for trouble swallowing. Negative for lump/mass, mouth sores, nosebleeds and sore throat.   Eyes:  Negative for eye problems.  Respiratory:  Positive for shortness of breath. Negative for cough.   Cardiovascular:  Positive for leg swelling. Negative for chest pain and palpitations.  Gastrointestinal:  Negative for abdominal pain, constipation, diarrhea, nausea and vomiting.  Genitourinary:  Negative for bladder incontinence, difficulty urinating, dysuria, frequency, hematuria and nocturia.   Musculoskeletal:  Negative for arthralgias, back pain, flank pain, myalgias and neck pain.  Skin:  Negative for itching and rash.  Neurological:  Positive for dizziness and numbness. Negative for headaches.  Hematological:  Does not bruise/bleed easily.  Psychiatric/Behavioral:  Negative for depression, sleep disturbance  and suicidal ideas. The patient is nervous/anxious.   All other systems reviewed and are negative.    VITALS:   Blood pressure 111/64, pulse 90, temperature 99.2 F (37.3 C), temperature source Tympanic, resp. rate 20, weight 164 lb (74.4 kg), SpO2 99%.  Wt Readings from Last 3 Encounters:  07/24/23 164 lb (74.4 kg)  07/24/23 164 lb (74.4 kg)  07/14/23 155 lb 3.3 oz (70.4 kg)    Body mass index is 26.47 kg/m.  Performance status (ECOG): 1 - Symptomatic but completely ambulatory  PHYSICAL EXAM:   Physical Exam Vitals and nursing note reviewed. Exam conducted with a chaperone present.  Constitutional:      Appearance: Normal appearance.  Cardiovascular:     Rate and Rhythm: Normal rate and regular rhythm.     Pulses: Normal pulses.     Heart sounds: Normal heart sounds.  Pulmonary:     Effort: Pulmonary effort is normal.     Breath sounds: Normal breath sounds.  Abdominal:     Palpations: Abdomen is soft. There is no hepatomegaly, splenomegaly or mass.     Tenderness: There is no abdominal tenderness.  Musculoskeletal:     Right lower leg: Edema present.     Left lower leg: Edema present.  Lymphadenopathy:     Cervical: No cervical adenopathy.     Right cervical: No superficial, deep or posterior cervical adenopathy.    Left cervical: No superficial, deep or posterior cervical adenopathy.     Upper Body:     Right upper body: No supraclavicular or axillary adenopathy.     Left upper body: No supraclavicular or axillary adenopathy.  Neurological:     General: No focal deficit present.     Mental Status: He is alert and oriented to person, place, and time.  Psychiatric:        Mood and Affect: Mood normal.        Behavior: Behavior normal.     LABS:   CBC    Component Value Date/Time   WBC 7.9 07/09/2023 0444   RBC 3.44 (L)  07/09/2023 0444   HGB 9.9 (L) 07/09/2023 0444   HGB 14.5 08/12/2011 1256   HCT 31.0 (L) 07/09/2023 0444   HCT 44.7 08/12/2011 1256    PLT 344 07/09/2023 0444   PLT 240 08/12/2011 1256   MCV 90.1 07/09/2023 0444   MCV 82.2 08/12/2011 1256   MCH 28.8 07/09/2023 0444   MCHC 31.9 07/09/2023 0444   RDW 16.4 (H) 07/09/2023 0444   RDW 14.7 (H) 08/12/2011 1256   LYMPHSABS 1.6 07/08/2023 1536   LYMPHSABS 5.5 (H) 08/12/2011 1256   MONOABS 1.3 (H) 07/08/2023 1536   MONOABS 1.6 (H) 08/12/2011 1256   EOSABS 0.2 07/08/2023 1536   EOSABS 0.2 08/12/2011 1256   BASOSABS 0.0 07/08/2023 1536   BASOSABS 0.1 08/12/2011 1256    CMP    Component Value Date/Time   NA 136 07/14/2023 0442   K 4.1 07/14/2023 0442   CL 81 (L) 07/14/2023 0442   CO2 45 (H) 07/14/2023 0442   GLUCOSE 83 07/14/2023 0442   BUN 19 07/14/2023 0442   CREATININE 0.44 (L) 07/14/2023 0442   CREATININE 0.68 (L) 05/25/2021 1021   CALCIUM  9.2 07/14/2023 0442   PROT 6.3 (L) 07/09/2023 1842   ALBUMIN  2.4 (L) 07/09/2023 1842   AST 47 (H) 07/09/2023 1842   ALT 45 (H) 07/09/2023 1842   ALKPHOS 183 (H) 07/09/2023 1842   BILITOT 0.5 07/09/2023 1842   GFRNONAA >60 07/14/2023 0442   GFRNONAA 98 03/14/2020 1304   GFRAA 114 03/14/2020 1304     No results found for: "CEA1", "CEA" / No results found for: "CEA1", "CEA" No results found for: "PSA1" No results found for: "CAN199" No results found for: "CAN125"  Lab Results  Component Value Date   TOTALPROTELP 7.1 09/19/2016   ALBUMINELP 3.5 (L) 09/19/2016   A1GS 0.4 (H) 09/19/2016   A2GS 0.9 09/19/2016   BETS 0.5 09/19/2016   BETA2SER 0.5 09/19/2016   GAMS 1.3 09/19/2016   SPEI   09/19/2016   Lab Results  Component Value Date   TIBC 155 (L) 07/10/2023   TIBC 213 (L) 07/17/2016   TIBC 202 (L) 01/19/2016   FERRITIN 512 (H) 07/10/2023   FERRITIN 188 01/28/2018   FERRITIN 444 (H) 07/17/2016   IRONPCTSAT 19 07/10/2023   IRONPCTSAT 15 07/17/2016   IRONPCTSAT 9 (L) 01/19/2016   Lab Results  Component Value Date   LDH 256 (H) 06/18/2023   LDH 205 (H) 03/25/2023   LDH 151 03/19/2022     STUDIES:    ECHOCARDIOGRAM LIMITED Result Date: 07/12/2023    ECHOCARDIOGRAM LIMITED REPORT   Patient Name:   JABAREE MERCADO [facility] Date of Exam: 07/12/2023 Medical Rec #:  [id number]      Height:       66.0 in Accession #:    [id number]     Weight:       158.1 lb Date of Birth:  1955/10/02       BSA:          1.810 m Patient Age:    68 years       BP:           156/76 mmHg Patient Gender: M              HR:           82 bpm. Exam Location:  [facility] Procedure: Limited Echo, Limited Color Doppler and Cardiac Doppler (Both            Spectral and Color  Flow Doppler were utilized during procedure). Indications:    CHF  History:        Patient has prior history of Echocardiogram examinations, most                 recent 01/14/2023. CHF, CAD, Signs/Symptoms:Chest Pain; Risk                 Factors:Hypertension and Dyslipidemia.  Sonographer:    Juanita Shaw Referring Phys: [id number] SCOTESIA Y DUNLAP IMPRESSIONS  1. Left ventricular ejection fraction, by estimation, is 35 to 40%. The left ventricle has moderately decreased function.  2. Right ventricular systolic function is normal. The right ventricular size is normal. There is moderately elevated pulmonary artery systolic pressure.  3. A small pericardial effusion is present. The pericardial effusion is surrounding the apex. Large pleural effusion in both left and right lateral regions.  4. No evidence of mitral valve regurgitation.  5. The aortic valve was not well visualized. Aortic valve regurgitation is not visualized.  6. The inferior vena cava is normal in size with greater than 50% respiratory variability, suggesting right atrial pressure of 3 mmHg. Comparison(s): Prior images reviewed side by side. Large-bilateral pleural effusions are new. FINDINGS  Left Ventricle: Left ventricular ejection fraction, by estimation, is 35 to 40%. The left ventricle has moderately decreased function. The left ventricular internal cavity size was normal in size. Right Ventricle: The right  ventricular size is normal. No increase in right ventricular wall thickness. Right ventricular systolic function is normal. There is moderately elevated pulmonary artery systolic pressure. The tricuspid regurgitant velocity is 3.50 m/s, and with an assumed right atrial pressure of 3 mmHg, the estimated right ventricular systolic pressure is 52.0 mmHg. Pericardium: A small pericardial effusion is present. The pericardial effusion is surrounding the apex. Tricuspid Valve: The tricuspid valve is normal in structure. Tricuspid valve regurgitation is not demonstrated. No evidence of tricuspid stenosis. Aortic Valve: The aortic valve was not well visualized. Aortic valve regurgitation is not visualized. Venous: The inferior vena cava is normal in size with greater than 50% respiratory variability, suggesting right atrial pressure of 3 mmHg. Additional Comments: There is a large pleural effusion in both left and right lateral regions.  LEFT VENTRICLE PLAX 2D LVIDd:         5.20 cm LVIDs:         4.10 cm LV PW:         1.10 cm LV IVS:        0.90 cm  LV Volumes (MOD) LV vol d, MOD A2C: 133.0 ml LV vol d, MOD A4C: 120.0 ml LV vol s, MOD A2C: 73.8 ml LV vol s, MOD A4C: 77.3 ml LV SV MOD A2C:     59.2 ml LV SV MOD A4C:     120.0 ml LV SV MOD BP:      52.9 ml RIGHT VENTRICLE            IVC RV S prime:     9.46 cm/s  IVC diam: 1.30 cm TAPSE (M-mode): 0.7 cm TRICUSPID VALVE TR Peak grad:   49.0 mmHg TR Vmax:        350.00 cm/s Gloriann Larger MD Electronically signed by Gloriann Larger MD Signature Date/Time: 07/12/2023/1:35:39 PM    Final    DG Chest 2 View Result Date: 07/08/2023 CLINICAL DATA:  Shortness of breath EXAM: CHEST - 2 VIEW COMPARISON:  June 08, 2023, 4:59 a.m. FINDINGS: Persistent bilateral pulmonary infiltrates with basilar atelectasis and  bilateral pleural effusions findings that could correlate with pneumonia and superimposed congestive changes. Heart and mediastinum upper limits of normal with a left  ventricular configuration. IMPRESSION: Persistent pulmonary infiltrates and atelectasis Electronically Signed   By: Fredrich Jefferson M.D.   On: 07/08/2023 15:37   NM PET Image Initial (PI) Skull Base To Thigh Result Date: 06/30/2023 CLINICAL DATA:  Initial treatment strategy for non-small-cell lung cancer. EXAM: NUCLEAR MEDICINE PET SKULL BASE TO THIGH TECHNIQUE: 8.06 mCi F-18 FDG was injected intravenously. Full-ring PET imaging was performed from the skull base to thigh after the radiotracer. CT data was obtained and used for attenuation correction and anatomic localization. Fasting blood glucose: 74 mg/dl COMPARISON:  Chest CT with contrast 06/06/2023. Older exams as well. Chest x-ray 06/08/2023 FINDINGS: Mediastinal blood pool activity: SUV max 2.4 Liver activity: SUV max 2.4 NECK: Hypermetabolic lymph node seen to the right of the thyroid gland with maximum SUV value of 8.8. Lesion on image 35 of the CT scan series 202, measures 17 by 14 mm. No additional areas of abnormal soft tissue uptake identified in the neck including submandibular or posterior triangle regions. No left-sided nodal hypermetabolic uptake. There is symmetric uptake of the visualized intracranial compartment. Incidental CT findings: Visualized paranasal sinuses and mastoid air cells are clear. Mild streak artifact related to the patient's dental hardware. The parotid glands, submandibular glands and thyroid gland is unremarkable. CHEST: Subtle areas of uptake seen along nodes in the mediastinum and left hilum. Focus of most intense uptake in the left lung hilum has maximum SUV value of 7.0. Nodular thickening in this location on image 60. Nodal enlargement better seen on the prior contrast CT scan. Uptake in the mediastinum includes subcarinal, paratracheal and prevascular nodes. AP window lymph node. Uptake along the AP window node has maximum SUV of 4.8. Node seen on image 52. Additional area of uptake along right hilum with maximum SUV of  4.3. Patchy areas of uptake identified along left hemithorax in the left lower lobe with there is large area of consolidation. The central area of uptake has maximum SUV value of 5.6 diameter of this area on image 65 measures 2.2 cm. Distribution of the opacities similar to the prior contrast chest CT. There is also areas of confluent opacity seen in the lingula and right lower lobe. Areas of pleural effusion again seen including some loculated components on the left extending along the interlobar fissure towards the left lung apex. Incidental CT findings: Heart is nonenlarged. Coronary artery calcifications are seen. The thoracic aorta also has significant calcified plaque. Diffuse areas of interstitial septal thickening with some emphysematous changes and breathing motion. ABDOMEN/PELVIS: Some small foci of uptake seen in the liver. Most pronounced is seen in the right hepatic lobe with maximum SUV value of 4.8. This is seen on image 77 with diameter of uptake approaching 10 mm. Intrinsic liver lesion is possible. There is a low-density lesion seen on the prior CT scan in this location as well. There is also some uptake along both adrenal glands. More pronounced on the left with focal nodule. Maximum SUV value 6.3. Area measures on the prior CT scan with contrast at 15 by 18 mm. Physiologic uptake along the bowel and renal collecting systems. Incidental CT findings: To extensive vascular calcifications along the aorta and branch vessels. No obstructing renal or ureteral stones. Motion. The bowel is nondilated. Scattered colonic stool. Gallbladder is nondilated. SKELETON: Extensive osseous metastatic disease identified with extensive areas of sclerosis. There are patchy  areas identified including multiple ribs, bilateral scapula, right humerus, sternum, clavicles. Extensive areas along the spine and pelvis. Bilateral femurs are involved. Example areas of uptake include left hemi sacrum with maximum SUV value of  11.2. Maximum SUV value of 7.9 involving the anterior aspect of the left third rib. Right humeral head maximum SUV 5.1. Incidental CT findings: Scattered degenerative changes. IMPRESSION: Extensive metastatic disease identified. Extensive bony sclerosis throughout the skeleton with areas of increased uptake. Numerous abnormal lymph nodes identified involving the mediastinum, hilum as well as the right thoracic inlet, supraclavicular region. Metastatic foci involving the left adrenal gland and likely a focus in the right hepatic lobe of the liver. Loculated pleural effusion on the left and non loculated right. Persistent consolidative opacities identified greatest in the left lower lobe with a central area of increased uptake in the area of consolidation in left lower lobe. Additional area of disease is possible. Please correlate for primary neoplasm. Electronically Signed   By: Adrianna Horde M.D.   On: 06/30/2023 14:10

## 2023-07-24 NOTE — Assessment & Plan Note (Signed)
 Continue to hold blood pressure agents, due to risk of hypotension

## 2023-07-24 NOTE — H&P (Signed)
 History and Physical    Patient: Curtis Flores TKZ:[id number] DOB: [birth year] DOA: 07/24/2023 DOS: the patient was seen and examined on 07/24/2023 PCP: Curtis Krauss, PA-C  Patient coming from: Home  Chief Complaint:  Chief Complaint  Patient presents with   Shortness of Breath   HPI: Curtis Flores is a 68 y.o. male with medical history significant of lung cancer, prostate cancer, paroxysmal atrial fibrillation, pulmonary fibrosis, hypertension, coronary artery disease, and dyslipidemia who presented with dyspnea.  Recent hospitalization 04/4 to 06/13/23 for community acquired pneumonia, found to have a left malignant pleural effusion consistent with primary adenocarcinoma of the lung with extensive sclerotic osseous lesions.  04/24 Outpatient PET scan with positive metastatic diease to bones, live and nodes.  Recent hospitalization 05/06 to 07/14/23 for heart failure decompensation. Patient underwent diuresis and was discharged home.  Today had follow up with Oncology, he was found volume overloaded with 6 Lbs weight increased and was referred to the ED.   Patient reports about 7 to 10 days of worsening dyspnea on exertion, associated with lower extremity edema, PND and orthopnea. Poor oral intake and generalized weakness. His dyspnea continue to progress very rapidly, specially over the last 2 days,  to the point where was symptomatic with minimal efforts. Note able to move independently.    At home uses supplemental 02 per [place] 02 2 L/min. He uses a walker for ambulation. He lives with his wife at home.   Review of Systems: As mentioned in the history of present illness. All other systems reviewed and are negative. Past Medical History:  Diagnosis Date   Abnormal CT scan, stomach    Chronic systolic dysfunction of left ventricle    Coronary artery disease    lateral STEMI 02/22/15 3/10 cutting balloon to ISR of the o/pLCX   GERD (gastroesophageal reflux disease)    Hyperlipidemia     Hypertension    Ischemic cardiomyopathy    LBBB (left bundle branch block)    Leukocytosis    followed by hematology, reactive   Lymphadenopathy    Myocardial infarction (HCC) 02/2015   Psoriasis 2003   Psoriatic arthritis (HCC)    Pulmonary fibrosis (HCC)    Rheumatoid arthritis(714.0) 2012   Typical atrial flutter (HCC)    Past Surgical History:  Procedure Laterality Date   CARDIAC CATHETERIZATION N/A 02/22/2015   Procedure: Left Heart Cath and Coronary Angiography;  Surgeon: Avanell Leigh, MD;  Location: [facility] INVASIVE CV LAB;  Service: Cardiovascular;  Laterality: N/A;   CARDIAC CATHETERIZATION N/A 02/22/2015   Procedure: Coronary Stent Intervention;  Surgeon: Avanell Leigh, MD;  Location: [facility] INVASIVE CV LAB;  Service: Cardiovascular;  Laterality: N/A;   CARDIOVERSION N/A 04/12/2015   Procedure: CARDIOVERSION;  Surgeon: Darlis Eisenmenger, MD;  Location: [facility] ENDOSCOPY;  Service: Cardiovascular;  Laterality: N/A;   COLONOSCOPY  07/01/2003   TDD:[place] colonic mucosa except for the proximal right colon in the area of ICV which was not seen completely due to inadequate bowel prep. followed with ACBE which was normal.    COLONOSCOPY N/A 08/24/2015   Dr. Riley Cheadle: Normal colon. Next colonoscopy in 10 years.   CORONARY BALLOON ANGIOPLASTY N/A 05/12/2018   Procedure: CORONARY BALLOON ANGIOPLASTY;  Surgeon: Arty Binning, MD;  Location: [facility] INVASIVE CV LAB;  Service: Cardiovascular;  Laterality: N/A;   ELECTROPHYSIOLOGIC STUDY N/A 05/30/2015   Atrial fibrillation ablation by Dr Nunzio Belch   ESOPHAGOGASTRODUODENOSCOPY N/A 08/24/2015   Dr. Riley Cheadle: Medium-sized hiatal hernia, erosive gastropathy. Cameron lesions. Esophageal  mucosa distally suggestive of short segment Barrett's esophagus. Not confirmed on biopsy. Gastric biopsy with minimal chronic inflammation   GIVENS CAPSULE STUDY N/A 04/17/2016   Procedure: GIVENS CAPSULE STUDY;  Surgeon: Suzette Espy, MD;  Location: [facility] ENDO SUITE;  Service: Endoscopy;   Laterality: N/A;  Pt to arrive at 8:00 am for 8:30 am appt   LEFT HEART CATH AND CORONARY ANGIOGRAPHY N/A 05/11/2018   Procedure: LEFT HEART CATH AND CORONARY ANGIOGRAPHY;  Surgeon: Arty Binning, MD;  Location: [facility] INVASIVE CV LAB;  Service: Cardiovascular;  Laterality: N/A;   TEE WITHOUT CARDIOVERSION N/A 04/12/2015   Procedure: TRANSESOPHAGEAL ECHOCARDIOGRAM (TEE);  Surgeon: Darlis Eisenmenger, MD;  Location: [facility] ENDOSCOPY;  Service: Cardiovascular;  Laterality: N/A;   Social History:  reports that he quit smoking about 20 years ago. His smoking use included cigarettes. He started smoking about 50 years ago. He has a 30 pack-year smoking history. He has been exposed to tobacco smoke. He has never used smokeless tobacco. He reports that he does not drink alcohol and does not use drugs.  No Known Allergies  Family History  Problem Relation Age of Onset   Hypertension Mother    Colon cancer Neg Hx     Prior to Admission medications   Medication Sig Start Date End Date Taking? Authorizing Provider  amLODipine  (NORVASC ) 5 MG tablet Take 1 tablet (5 mg total) by mouth daily. 04/24/23  Yes Avanell Leigh, MD  apixaban  (ELIQUIS ) 5 MG TABS tablet TAKE 1 TABLET TWICE DAILY 05/15/23  Yes Avanell Leigh, MD  atorvastatin  (LIPITOR ) 80 MG tablet TAKE 1 TABLET EVERY DAY AT 6 PM Patient taking differently: Take 80 mg by mouth daily at 6 PM. 07/08/23  Yes Avanell Leigh, MD  azaTHIOprine  (IMURAN ) 50 MG tablet TAKE 3 TABLETS EVERY DAY Patient taking differently: Take 150 mg by mouth every morning. 03/31/23  Yes Romayne Clubs, PA-C  Clobetasol  Prop Emollient Base (CLOBETASOL  PROPIONATE E) 0.05 % emollient cream APPLY TO AFFECTED AREA TWICE A DAY AS NEEDED Patient taking differently: Apply 1 Application topically 2 (two) times daily as needed. Applied to scalp for dermatitis 04/04/23  Yes Romayne Clubs, PA-C  clopidogrel  (PLAVIX ) 75 MG tablet Take 1 tablet (75 mg total) by mouth daily. 04/24/23  Yes Avanell Leigh, MD  docusate sodium  (COLACE) 100 MG capsule Take 1 capsule (100 mg total) by mouth daily. 06/13/23  Yes Pokhrel, Laxman, MD  feeding supplement (ENSURE ENLIVE / ENSURE PLUS) LIQD Take 237 mLs by mouth 2 (two) times daily between meals. 06/13/23  Yes Pokhrel, Amador Bad, MD  ferrous sulfate  325 (65 FE) MG tablet Take 1 tablet (325 mg total) by mouth daily with breakfast. 07/15/23  Yes Shahmehdi, Seyed A, MD  losartan  (COZAAR ) 25 MG tablet Take 1 tablet (25 mg total) by mouth daily. 07/15/23  Yes Shahmehdi, Constantino Demark, MD  metoprolol  succinate (TOPROL -XL) 50 MG 24 hr tablet Take 1 tablet (50 mg total) by mouth 2 (two) times daily. 07/14/23 08/13/23 Yes Shahmehdi, Constantino Demark, MD  pantoprazole  (PROTONIX ) 40 MG tablet TAKE 1 TABLET TWICE DAILY Patient taking differently: Take 40 mg by mouth 2 (two) times daily before a meal. 11/02/20  Yes Delman Ferns, NP  potassium chloride  SA (KLOR-CON  M) 20 MEQ tablet TAKE 2 TABLETS TWICE DAILY Patient taking differently: Take 40 mEq by mouth 2 (two) times daily. 10/31/22  Yes Avanell Leigh, MD  torsemide  40 MG TABS Take 40 mg by mouth daily.  07/15/23 08/14/23 Yes Shahmehdi, Constantino Demark, MD  oxyCODONE  (OXY IR/ROXICODONE ) 5 MG immediate release tablet Take 1 tablet (5 mg total) by mouth every 8 (eight) hours as needed for severe pain (pain score 7-10). 07/24/23   Paulett Boros, MD    Physical Exam: Vitals:   07/24/23 1630 07/24/23 1730 07/24/23 1930 07/24/23 1953  BP: (!) 152/68 133/82 134/79 130/72  Pulse: (!) 116 (!) 110 98 64  Resp: (!) 24 (!) 24 (!) 27 (!) 21  Temp:    97.6 F (36.4 C)  TempSrc:    Oral  SpO2: 96% 95% 95% 94%  Weight:      Height:       Neurology awake and alert ENT with mild pallor with no icterus Cardiovascular with S1 and S2 present and regular with no gallops or rubs, no murmurs Positive JVD Respiratory with right rales and scattered rhonchi, left with decreased breath sounds and dullness to percussion  Abdomen with no distention   Positive lower extremity edema +++ cold lower extremities.   Data Reviewed:   Na 121, K 4.3 Cl 76 bicarbonate 34 glucose 79 bun 15 cr 0,44  AST 51 ALT 40 total bilirubin 0,9  INR 1,6  BNP 325  Lactic acid 1,5  Wbc 17.6 hgb 11.8 plt 268   Urine analysis SG 1,014, protein 100, negative leukocytes and negative hgb   Chest radiograph with cardiomegaly, left hemithorax opacification, combination of atelectasis and pleural effusion with mild displacement of the mediastinum to the right.  Right base atelectasis with small pleural effusion   CT chest with large loculated left pleural effusion, increased compared to prior, moderate right pleural effusion, occluded appearing left bronchus with appearance suggesting possible soft tissue thickening of the wall. Diffuse heterogeneous consolidation of the lung parenchyma with focal areas of gas and fluid collection within suggesting necrosis and or possible developing abscess.  Positive honeycombing on the right upper and lower lobes, with traction bronchiectasis.  Extensive lytic and sclerotic metastatic disease involving the ribs, spine, and shoulders. New pathologic fracture deformity at T 12.  Left adrenal mass measuring 19 mm, corresponding to metastatic focus on PET CT.   EKG 101 bpm, left axis deviation. Qtc 446 left bundle branch block, atrial flutter with variable block, poor RR wave progression with no significant ST segment or T wave changes.   Assessment and Plan: * Acute on chronic systolic CHF (congestive heart failure) (HCC) Echocardiogram with reduced LV systolic function with EF 35 to 40,, RV systolic function preserved, RVSP 52 mmHg, small pericardial effusion, no significant valvular disease.   Patient with significant volume overload and cold lower extremities, possibly developing low out put.   Plan to aggressive diuresis with furosemide  80 mg IV bid  Hold on losartan , metoprolol  and amlodipine  due to risk of hypotension.   Close monitoring of blood pressure.   HCAP (healthcare-associated pneumonia) Likely post obstructive pneumonia. In the setting of lung cancer, with medical immunosuppression due azathioprine .  Bilateral pleural effusions more left than right with acute on chronic hypoxemic respiratory failure.  Positive leukocytosis, tachypnea and tachycardia, consistent with sepsis present on admission.   Plan to continue antibiotic therapy vancomycin  and zosyn Bronchodilator therapy and airway clearing techniques with flutter valve and incentive spirometer.  Supplemental 02 per [place] to keep 02 saturation 88% or greater.  Consultation with IR for left thoracentesis, note that fluid is loculated, not sure who much fluid will be drain. Considering his pulmonary malignancy which is advance not likely candidate for  more advanced pleural interventions.   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  Will consult palliative care.  Continue as needed analgesics.   Pulmonary fibrosis (HCC) Chronic hypoxemic respiratory failure.  Currently will hold on azathioprine  in the setting of sepsis.   Atrial flutter (HCC) Continue telemetry monitoring Considering need for left pleural intervention will hold on anticoagulation.  Hold metoprolol  in the setting of low EF and acute decompensation If develops RVR will have to use antiarrhythmic therapy.   Essential hypertension Continue to hold blood pressure agents, due to risk of hypotension   Mixed hyperlipidemia Continue statin therapy   Rheumatoid arthritis (HCC) Hold immunosuppressive therapy   GERD (gastroesophageal reflux disease) Continue pantoprazole    Hyponatremia Due to hypervolemia  Will continue diuresis with furosemide  and follow up renal function and electrolytes in am.  Avoid hypotension and nephrotoxic medications.       Advance Care Planning:    Code Status: Full Code   Consults: IR   Family Communication: no family at the bedside   Severity of Illness: The appropriate patient status for this patient is INPATIENT. Inpatient status is judged to be reasonable and necessary in order to provide the required intensity of service to ensure the patient's safety. The patient's presenting symptoms, physical exam findings, and initial radiographic and laboratory data in the context of their chronic comorbidities is felt to place them at high risk for further clinical deterioration. Furthermore, it is not anticipated that the patient will be medically stable for discharge from the hospital within 2 midnights of admission.   * I certify that at the point of admission it is my clinical judgment that the patient will require inpatient hospital care spanning beyond 2 midnights from the point of admission due to high intensity of service, high risk for further deterioration and high frequency of surveillance required.*  Author: Albertus Alt, MD 07/24/2023 8:34 PM  For on call review [email or web address].

## 2023-07-24 NOTE — Assessment & Plan Note (Signed)
 Continue telemetry monitoring Considering need for left pleural intervention will hold on anticoagulation.  Hold metoprolol  in the setting of low EF and acute decompensation If develops RVR will have to use antiarrhythmic therapy.

## 2023-07-24 NOTE — ED Provider Notes (Signed)
 Assumed care of patient from off-going team. For more details, please see note from same day.  In brief, this is a 68 y.o. male w/ swelling in BL LEs and SOB. H/o CHF, pulm fibrosis, and adenocarcinoma of lung metastatic to bone. Recent admission for HCAP and CHF exacerbation also w/ L pleural effusion s/p thoracentesis. On 2L [place] chronically. Stable on 2L [place] here today, mildly tachypneic but no resp distress. Likely volume overloaded here today, given lasix .  Plan/Dispo at time of sign-out & ED Course since sign-out: [ ]  CT PE  BP 133/82   Pulse (!) 110   Temp 97.8 F (36.6 C) (Oral)   Resp (!) 24   Ht 5\' 6"  (1.676 m)   Wt 74.4 kg   SpO2 95%   BMI 26.47 kg/m    ED Course:   Clinical Course as of 07/24/23 1941  Thu Jul 24, 2023  1903 CT Chest W Contrast 1. Large loculated left pleural effusion, increased compared to prior. Moderate right-sided pleural effusion also increased compared to prior. Occluded appearing left bronchus with appearance suggesting possible soft tissue thickening of the wall, correlation with bronchoscopy if not already performed. There is diffuse heterogeneous consolidation of the lung parenchyma with focal areas of gas and fluid collection within suggesting necrosis and or possibly developing abscess. 2. New heterogeneous foci of airspace disease in the right upper and lower lobes, possible pneumonia, superimposed on underlying fibrotic lung disease 3. Extensive lytic and sclerotic metastatic disease involving the ribs, sternum, spine, and shoulders. New pathologic fracture deformity at T12. 4. Left adrenal mass measuring 19 mm, corresponding to history of metastatic focus on PET CT. 5. Aortic atherosclerosis.   [HN]  1905 Blood cultures ordered. Ordered vanc/zosyn and consulted to pulm. [HN]  1931 D/w Dr. Mason Sole with pulmonology who recommends pleurx catheter and culture of fluid w/ IR. Can get bronchoscopy as o/p. [HN]  1940 Patient admitted to  medicine [HN]    Clinical Course User Index [HN] Merdis Stalling, MD    Dispo: Admit w/ pulm following ------------------------------- Annita Kindle, MD Emergency Medicine  This note was created using dictation software, which may contain spelling or grammatical errors.  CRITICAL CARE Performed by: Merdis Stalling Total critical care time: 30 minutes Critical care time was exclusive of separately billable procedures and treating other patients. Critical care was necessary to treat or prevent imminent or life-threatening deterioration. Critical care was time spent personally by me on the following activities: development of treatment plan with patient and/or surrogate as well as nursing, discussions with consultants, evaluation of patient's response to treatment, examination of patient, obtaining history from patient or surrogate, ordering and performing treatments and interventions, ordering and review of laboratory studies, ordering and review of radiographic studies, pulse oximetry and re-evaluation of patient's condition.     Merdis Stalling, MD 07/24/23 [phone]

## 2023-07-24 NOTE — Assessment & Plan Note (Signed)
 Due to hypervolemia  Will continue diuresis with furosemide  and follow up renal function and electrolytes in am.  Avoid hypotension and nephrotoxic medications.

## 2023-07-24 NOTE — ED Triage Notes (Signed)
 Pt reports:  SOB Gained 9lbs in one week D/c 5/12 Since then worsening SOB Medication changes at discharge Increased oxygen with activity

## 2023-07-24 NOTE — ED Provider Notes (Signed)
 [place] EMERGENCY DEPARTMENT AT [facility] Provider Note   CSN: [id number] Arrival date & time: 07/24/23  1458     History  Chief Complaint  Patient presents with   Shortness of Breath    Curtis Flores is a 68 y.o. male.  Patient is a 68 year old male who presents to the emergency department with a chief complaint of increased swelling in bilateral lower extremities as well as increased shortness of breath.  He was discharged in the hospital possibly 2 weeks ago and changed to torsemide .  He has been compliant with his home medications.  Patient is chronically on 2 L nasal cannula at home and family notes that they have had to increase this.  Patient denies any associated chest pain.  He has had no abdominal pain, nausea, vomiting, diarrhea.  He does admit to a 9 pound weight gain since leaving the hospital.   Shortness of Breath      Home Medications Prior to Admission medications   Medication Sig Start Date End Date Taking? Authorizing Provider  amLODipine  (NORVASC ) 5 MG tablet Take 1 tablet (5 mg total) by mouth daily. 04/24/23   Avanell Leigh, MD  apixaban  (ELIQUIS ) 5 MG TABS tablet TAKE 1 TABLET TWICE DAILY 05/15/23   Avanell Leigh, MD  atorvastatin  (LIPITOR ) 80 MG tablet TAKE 1 TABLET EVERY DAY AT 6 PM 07/08/23   Avanell Leigh, MD  azaTHIOprine  (IMURAN ) 50 MG tablet TAKE 3 TABLETS EVERY DAY 03/31/23   Romayne Clubs, PA-C  clobetasol  (TEMOVATE ) 0.05 % external solution Apply 1 Application topically 2 (two) times daily. Patient taking differently: Apply 1 Application topically 2 (two) times daily as needed (Dermatitis). 01/24/23   Romayne Clubs, PA-C  Clobetasol  Prop Emollient Base (CLOBETASOL  PROPIONATE E) 0.05 % emollient cream APPLY TO AFFECTED AREA TWICE A DAY AS NEEDED 04/04/23   Romayne Clubs, PA-C  clopidogrel  (PLAVIX ) 75 MG tablet Take 1 tablet (75 mg total) by mouth daily. 04/24/23   Avanell Leigh, MD  docusate sodium  (COLACE) 100 MG capsule  Take 1 capsule (100 mg total) by mouth daily. 06/13/23   Pokhrel, Laxman, MD  feeding supplement (ENSURE ENLIVE / ENSURE PLUS) LIQD Take 237 mLs by mouth 2 (two) times daily between meals. 06/13/23   Pokhrel, Amador Bad, MD  ferrous sulfate  325 (65 FE) MG tablet Take 1 tablet (325 mg total) by mouth daily with breakfast. 07/15/23   Shahmehdi, Constantino Demark, MD  losartan  (COZAAR ) 25 MG tablet Take 1 tablet (25 mg total) by mouth daily. 07/15/23   Shahmehdi, Constantino Demark, MD  methylPREDNISolone  (MEDROL  DOSEPAK) 4 MG TBPK tablet Medrol  Dosepak take as instructed 07/14/23   Shahmehdi, Seyed A, MD  metoprolol  succinate (TOPROL -XL) 50 MG 24 hr tablet Take 1 tablet (50 mg total) by mouth 2 (two) times daily. 07/14/23 08/13/23  Bobbetta Burnet, MD  oxyCODONE  (OXY IR/ROXICODONE ) 5 MG immediate release tablet Take 1 tablet (5 mg total) by mouth every 8 (eight) hours as needed for severe pain (pain score 7-10). 07/24/23   Paulett Boros, MD  pantoprazole  (PROTONIX ) 40 MG tablet TAKE 1 TABLET TWICE DAILY 11/02/20   Delman Ferns, NP  potassium chloride  SA (KLOR-CON  M) 20 MEQ tablet TAKE 2 TABLETS TWICE DAILY 10/31/22   Avanell Leigh, MD  torsemide  40 MG TABS Take 40 mg by mouth daily. 07/15/23 08/14/23  Bobbetta Burnet, MD      Allergies    Patient has no known allergies.  Review of Systems   Review of Systems  Respiratory:  Positive for shortness of breath.   All other systems reviewed and are negative.   Physical Exam Updated Vital Signs BP (!) 152/68   Pulse (!) 116   Temp 97.8 F (36.6 C) (Oral)   Resp (!) 24   Ht 5\' 6"  (1.676 m)   Wt 74.4 kg   SpO2 96%   BMI 26.47 kg/m  Physical Exam Vitals and nursing note reviewed.  Constitutional:      Appearance: Normal appearance.  HENT:     Head: Normocephalic and atraumatic.     Nose: Nose normal.     Mouth/Throat:     Mouth: Mucous membranes are moist.  Eyes:     Extraocular Movements: Extraocular movements intact.     Conjunctiva/sclera:  Conjunctivae normal.     Pupils: Pupils are equal, round, and reactive to light.  Cardiovascular:     Rate and Rhythm: Normal rate and regular rhythm.     Pulses: Normal pulses.     Heart sounds: Normal heart sounds. No murmur heard.    No gallop.  Pulmonary:     Effort: Pulmonary effort is normal. Tachypnea present. No respiratory distress.     Breath sounds: No stridor. Examination of the left-upper field reveals decreased breath sounds. Examination of the left-middle field reveals decreased breath sounds. Examination of the left-lower field reveals decreased breath sounds. Decreased breath sounds and rales present.  Chest:     Chest wall: No tenderness.  Abdominal:     General: Abdomen is flat. Bowel sounds are normal.     Palpations: Abdomen is soft. There is no mass.     Tenderness: There is no abdominal tenderness. There is no guarding.  Musculoskeletal:        General: Normal range of motion.     Cervical back: Normal range of motion and neck supple.     Right lower leg: Edema present.     Left lower leg: Edema present.  Skin:    General: Skin is warm and dry.     Findings: No ecchymosis or rash.  Neurological:     General: No focal deficit present.     Mental Status: He is alert and oriented to person, place, and time. Mental status is at baseline.     Cranial Nerves: No cranial nerve deficit.     Motor: No weakness.  Psychiatric:        Mood and Affect: Mood normal.        Behavior: Behavior normal.        Thought Content: Thought content normal.        Judgment: Judgment normal.     ED Results / Procedures / Treatments   Labs (all labs ordered are listed, but only abnormal results are displayed) Labs Reviewed  CBC WITH DIFFERENTIAL/PLATELET - Abnormal; Notable for the following components:      Result Value   WBC 17.6 (*)    RBC 4.17 (*)    Hemoglobin 11.8 (*)    HCT 35.7 (*)    RDW 16.9 (*)    Neutro Abs 13.7 (*)    Monocytes Absolute 2.1 (*)    Abs  Immature Granulocytes 0.22 (*)    All other components within normal limits  PROTIME-INR - Abnormal; Notable for the following components:   Prothrombin Time 19.3 (*)    INR 1.6 (*)    All other components within normal limits  URINALYSIS, W/ REFLEX TO  CULTURE (INFECTION SUSPECTED) - Abnormal; Notable for the following components:   APPearance HAZY (*)    Protein, ur 100 (*)    All other components within normal limits  BRAIN NATRIURETIC PEPTIDE - Abnormal; Notable for the following components:   B Natriuretic Peptide 325.0 (*)    All other components within normal limits  COMPREHENSIVE METABOLIC PANEL WITH GFR - Abnormal; Notable for the following components:   Sodium 121 (*)    Chloride 76 (*)    CO2 34 (*)    Creatinine, Ser 0.44 (*)    Calcium  8.7 (*)    Total Protein 6.3 (*)    Albumin  2.5 (*)    AST 51 (*)    Alkaline Phosphatase 197 (*)    All other components within normal limits  CULTURE, BLOOD (ROUTINE X 2)  CULTURE, BLOOD (ROUTINE X 2)  LACTIC ACID, PLASMA  LACTIC ACID, PLASMA    EKG EKG Interpretation Date/Time:  Thursday Jul 24 2023 15:10:37 EDT Ventricular Rate:  101 PR Interval:    QRS Duration:  134 QT Interval:  344 QTC Calculation: 446 R Axis:   -6  Text Interpretation: Atrial fibrillation with rapid ventricular response No significant change since last tracing Abnormal ECG When compared with ECG of 13-Jul-2023 00:22, No significant change was found Confirmed by Kommor, Madison ([id number]) on 07/24/2023 3:15:45 PM  Radiology DG Chest Port 1 View Result Date: 07/24/2023 CLINICAL DATA:  [id number] Sepsis (HCC) [id number] EXAM: PORTABLE CHEST 1 VIEW COMPARISON:  07/08/2023. FINDINGS: Since the prior study, there is further progression with now complete opacification of left hemithorax. There are increased interstitial markings in the right lung, grossly similar to the prior study. There is minimal right pleural effusion blunting the right lateral costophrenic angle and  probable atelectatic changes at the right lung base. Evaluation of cardiomediastinal silhouette is nondiagnostic due to left hemithorax opacification. No acute osseous abnormalities. The soft tissues are within normal limits. IMPRESSION: 1. Further progression with now complete opacification of left hemithorax. Findings may represent combination of pleural effusion and lung collapse with or without consolidation. 2. Increased interstitial markings in the right lung, grossly similar to the prior study. 3. Minimal right pleural effusion. Electronically Signed   By: Beula Brunswick M.D.   On: 07/24/2023 15:57    Procedures Procedures    Medications Ordered in ED Medications  furosemide  (LASIX ) injection 40 mg (40 mg Intravenous Given 07/24/23 1531)  iohexol  (OMNIPAQUE ) 300 MG/ML solution 75 mL (75 mLs Intravenous Contrast Given 07/24/23 1704)    ED Course/ Medical Decision Making/ A&P                                 Medical Decision Making Patient does remain stable at this time.  X-ray was concerning for left large pleural effusion so CT scan was obtained in order to better visualize this.  Blood work has demonstrated hyponatremia, elevated BNP, leukocytosis and anemia.  CT scan is still pending at this time for final dispo.  Will sign patient out to attending physician Annita Kindle pending CT scan reports.  Patient has been given dose of Lasix  in the emergency department for his apparent fluid overload.  Patient is on his home 2 L nasal cannula at this point.  EKG is consistent with atrial fibrillation with a mild elevation in his heart rate.  Amount and/or Complexity of Data Reviewed Labs: ordered. Radiology: ordered.  Risk Prescription drug management.  Final Clinical Impression(s) / ED Diagnoses Final diagnoses:  None    Rx / DC Orders ED Discharge Orders     None         Roselynn Connors, PA-C 07/24/23 1858    Merdis Stalling, MD 07/24/23 [phone]

## 2023-07-24 NOTE — Assessment & Plan Note (Signed)
 Likely post obstructive pneumonia. In the setting of lung cancer, with medical immunosuppression due azathioprine .  Bilateral pleural effusions more left than right with acute on chronic hypoxemic respiratory failure.  Positive leukocytosis, tachypnea and tachycardia, consistent with sepsis present on admission.   Plan to continue antibiotic therapy vancomycin  and zosyn Bronchodilator therapy and airway clearing techniques with flutter valve and incentive spirometer.  Supplemental 02 per  to keep 02 saturation 88% or greater.  Consultation with IR for left thoracentesis, note that fluid is loculated, not sure who much fluid will be drain. Considering his pulmonary malignancy which is advance not likely candidate for more advanced pleural interventions.

## 2023-07-24 NOTE — Assessment & Plan Note (Signed)
 Continue pantoprazole.

## 2023-07-24 NOTE — Consult Note (Signed)
 Pharmacy Antibiotic Note  Curtis Flores is a 68 y.o. male admitted on 07/24/2023 with pneumonia. Pharmacy has been consulted for vancomycin  dosing. Chest imaging shows possible necrosis and/or possible developing abscess with new heterogeneous foci of airspace disease in the right upper and lower lobes, possible pneumonia, superimposed on underlying fibrotic lung disease.   Today, 07/24/2023:  Day 1 of vancomycin   WBC 17.6  Afebrile  Renal function stable at baseline  Currently on 2L [place]   Plan:  Give vancomycin  loading dose of 1750 mg, followed by vancomycin  2000 mg Q24H  Goal AUC 400-550  Estimated AUC 543 Estimated Cmin 10.2 SCr used 0.8, IBW, Vd 0.72 (BMI 26)  F/u MRSA nares for possible de-escalation  Pharmacy will continue to monitor and dose adjust appropriately   Height: 5\' 6"  (167.6 cm) Weight: 74.4 kg (164 lb) IBW/kg (Calculated) : 63.8  Temp (24hrs), Avg:98.5 F (36.9 C), Min:97.8 F (36.6 C), Max:99.2 F (37.3 C)  Recent Labs  Lab 07/24/23 1537 07/24/23 1538 07/24/23 1631  WBC 17.6*  --   --   CREATININE  --   --  0.44*  LATICACIDVEN  --  1.5  --     Estimated Creatinine Clearance: 77.5 mL/min (A) (by C-G formula based on SCr of 0.44 mg/dL (L)).    No Known Allergies  Antimicrobials this admission: 05/22 vancomycin  >>  05/22 Zosyn >>   Dose adjustments this admission:  Microbiology results: 05/22 BCx: sent  05/22 MRSA nares: ordered   Thank you for allowing pharmacy to be a part of this patient's care.  Pansy Bogus, PharmD Pharmacy Resident  07/24/2023 7:06 PM

## 2023-07-24 NOTE — Assessment & Plan Note (Signed)
 Echocardiogram with reduced LV systolic function with EF 35 to 40,, RV systolic function preserved, RVSP 52 mmHg, small pericardial effusion, no significant valvular disease.   Patient with significant volume overload and cold lower extremities, possibly developing low out put.   Plan to aggressive diuresis with furosemide  80 mg IV bid  Hold on losartan , metoprolol  and amlodipine  due to risk of hypotension.  Close monitoring of blood pressure.

## 2023-07-24 NOTE — Assessment & Plan Note (Signed)
 Chronic hypoxemic respiratory failure.  Currently will hold on azathioprine  in the setting of sepsis.

## 2023-07-24 NOTE — Assessment & Plan Note (Signed)
 Continue statin therapy.

## 2023-07-24 NOTE — Assessment & Plan Note (Signed)
 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  Will consult palliative care.  Continue as needed analgesics.

## 2023-07-24 NOTE — ED Notes (Signed)
 [facility] called for TLSO Brace

## 2023-07-24 NOTE — Assessment & Plan Note (Signed)
 Hold immunosuppressive therapy

## 2023-07-25 ENCOUNTER — Ambulatory Visit: Payer: Medicare HMO | Admitting: Rheumatology

## 2023-07-25 ENCOUNTER — Inpatient Hospital Stay (HOSPITAL_COMMUNITY)

## 2023-07-25 DIAGNOSIS — L4050 Arthropathic psoriasis, unspecified: Secondary | ICD-10-CM

## 2023-07-25 DIAGNOSIS — Z8639 Personal history of other endocrine, nutritional and metabolic disease: Secondary | ICD-10-CM

## 2023-07-25 DIAGNOSIS — C801 Malignant (primary) neoplasm, unspecified: Secondary | ICD-10-CM

## 2023-07-25 DIAGNOSIS — Z8719 Personal history of other diseases of the digestive system: Secondary | ICD-10-CM

## 2023-07-25 DIAGNOSIS — M0579 Rheumatoid arthritis with rheumatoid factor of multiple sites without organ or systems involvement: Secondary | ICD-10-CM

## 2023-07-25 DIAGNOSIS — M05741 Rheumatoid arthritis with rheumatoid factor of right hand without organ or systems involvement: Secondary | ICD-10-CM | POA: Diagnosis not present

## 2023-07-25 DIAGNOSIS — E871 Hypo-osmolality and hyponatremia: Secondary | ICD-10-CM

## 2023-07-25 DIAGNOSIS — I4892 Unspecified atrial flutter: Secondary | ICD-10-CM

## 2023-07-25 DIAGNOSIS — J189 Pneumonia, unspecified organism: Secondary | ICD-10-CM

## 2023-07-25 DIAGNOSIS — I5023 Acute on chronic systolic (congestive) heart failure: Secondary | ICD-10-CM

## 2023-07-25 DIAGNOSIS — I10 Essential (primary) hypertension: Secondary | ICD-10-CM

## 2023-07-25 DIAGNOSIS — Z8709 Personal history of other diseases of the respiratory system: Secondary | ICD-10-CM

## 2023-07-25 DIAGNOSIS — Z8679 Personal history of other diseases of the circulatory system: Secondary | ICD-10-CM

## 2023-07-25 DIAGNOSIS — M8589 Other specified disorders of bone density and structure, multiple sites: Secondary | ICD-10-CM

## 2023-07-25 DIAGNOSIS — E782 Mixed hyperlipidemia: Secondary | ICD-10-CM | POA: Diagnosis not present

## 2023-07-25 DIAGNOSIS — M05742 Rheumatoid arthritis with rheumatoid factor of left hand without organ or systems involvement: Secondary | ICD-10-CM

## 2023-07-25 DIAGNOSIS — Z79899 Other long term (current) drug therapy: Secondary | ICD-10-CM

## 2023-07-25 DIAGNOSIS — R7612 Nonspecific reaction to cell mediated immunity measurement of gamma interferon antigen response without active tuberculosis: Secondary | ICD-10-CM

## 2023-07-25 DIAGNOSIS — K219 Gastro-esophageal reflux disease without esophagitis: Secondary | ICD-10-CM

## 2023-07-25 DIAGNOSIS — J8410 Pulmonary fibrosis, unspecified: Secondary | ICD-10-CM

## 2023-07-25 DIAGNOSIS — L409 Psoriasis, unspecified: Secondary | ICD-10-CM

## 2023-07-25 LAB — CBC
HCT: 34.5 % — ABNORMAL LOW (ref 39.0–52.0)
Hemoglobin: 10.8 g/dL — ABNORMAL LOW (ref 13.0–17.0)
MCH: 27.7 pg (ref 26.0–34.0)
MCHC: 31.3 g/dL (ref 30.0–36.0)
MCV: 88.5 fL (ref 80.0–100.0)
Platelets: 259 10*3/uL (ref 150–400)
RBC: 3.9 MIL/uL — ABNORMAL LOW (ref 4.22–5.81)
RDW: 17.2 % — ABNORMAL HIGH (ref 11.5–15.5)
WBC: 18.7 10*3/uL — ABNORMAL HIGH (ref 4.0–10.5)
nRBC: 0 % (ref 0.0–0.2)

## 2023-07-25 LAB — BASIC METABOLIC PANEL WITH GFR
Anion gap: 8 (ref 5–15)
BUN: 16 mg/dL (ref 8–23)
CO2: 37 mmol/L — ABNORMAL HIGH (ref 22–32)
Calcium: 8.4 mg/dL — ABNORMAL LOW (ref 8.9–10.3)
Chloride: 79 mmol/L — ABNORMAL LOW (ref 98–111)
Creatinine, Ser: 0.56 mg/dL — ABNORMAL LOW (ref 0.61–1.24)
GFR, Estimated: >60 mL/min (ref >60)
Glucose, Bld: 111 mg/dL — ABNORMAL HIGH (ref 70–99)
Potassium: 4.1 mmol/L (ref 3.5–5.1)
Sodium: 124 mmol/L — ABNORMAL LOW (ref 135–145)

## 2023-07-25 LAB — MRSA NEXT GEN BY PCR, NASAL: MRSA by PCR Next Gen: NOT DETECTED

## 2023-07-25 MED ORDER — MORPHINE SULFATE (PF) 2 MG/ML IV SOLN
1.0000 mg | INTRAVENOUS | Status: DC | PRN
  Administered 2023-07-25 – 2023-07-28 (×9): 1 mg via INTRAVENOUS
  Filled 2023-07-25 (×9): qty 1

## 2023-07-25 MED ORDER — DM-GUAIFENESIN ER 30-600 MG PO TB12
1.0000 | ORAL_TABLET | Freq: Two times a day (BID) | ORAL | Status: DC
  Administered 2023-07-25 – 2023-07-28 (×6): 1 via ORAL
  Filled 2023-07-25 (×6): qty 1

## 2023-07-25 MED ORDER — METHYLPREDNISOLONE SODIUM SUCC 40 MG IJ SOLR
40.0000 mg | Freq: Every day | INTRAMUSCULAR | Status: DC
  Administered 2023-07-25 – 2023-07-28 (×4): 40 mg via INTRAVENOUS
  Filled 2023-07-25 (×4): qty 1

## 2023-07-25 MED ORDER — BUDESONIDE 0.5 MG/2ML IN SUSP
0.5000 mg | Freq: Two times a day (BID) | RESPIRATORY_TRACT | Status: DC
  Administered 2023-07-25 – 2023-07-28 (×6): 0.5 mg via RESPIRATORY_TRACT
  Filled 2023-07-25 (×6): qty 2

## 2023-07-25 MED ORDER — METOPROLOL SUCCINATE ER 50 MG PO TB24
50.0000 mg | ORAL_TABLET | Freq: Two times a day (BID) | ORAL | Status: DC
  Administered 2023-07-25 – 2023-07-28 (×6): 50 mg via ORAL
  Filled 2023-07-25 (×6): qty 1

## 2023-07-25 NOTE — Plan of Care (Signed)
  Problem: Acute Rehab PT Goals(only PT should resolve) Goal: Pt Will Go Supine/Side To Sit Outcome: Progressing Flowsheets (Taken 07/25/2023 1353) Pt will go Supine/Side to Sit:  with supervision  with modified independence Goal: Patient Will Transfer Sit To/From Stand Outcome: Progressing Flowsheets (Taken 07/25/2023 1353) Patient will transfer sit to/from stand:  with supervision  with modified independence Goal: Pt Will Transfer Bed To Chair/Chair To Bed Outcome: Progressing Flowsheets (Taken 07/25/2023 1353) Pt will Transfer Bed to Chair/Chair to Bed:  with supervision  with modified independence Goal: Pt Will Ambulate Outcome: Progressing Flowsheets (Taken 07/25/2023 1353) Pt will Ambulate:  25 feet  with minimal assist  with rolling walker   1:54 PM, 07/25/23 Walton Guppy, MPT Physical Therapist with [facility] [phone] office [phone] mobile phone

## 2023-07-25 NOTE — TOC Initial Note (Signed)
 Transition of Care [facility]) - Initial/Assessment Note    Patient Details  Name: Curtis Flores MRN: [id number] Date of Birth: 1955-10-06  [facility] CM/SW Contact:    Cyndie Dredge, LCSWA Phone Number: 07/25/2023, 2:08 PM  Clinical Narrative:                  Patient is at risk for admission due to high admission score. Patient was admitted for Acute on chronic systolic CHF. Writer spoke with spouse and assessed patient. Spouse stated that she provides all of patient care and states that patient has a WC at home. Writer did mention to spouse that PT recommenced HHPT. Spouse agreeable to writer ordering HHPT. [facility] responded and able to provide HHPT. Spouse made aware. TOC to follow.   Expected Discharge Plan: Home w Home Health Services Barriers to Discharge: Continued Medical Work up   Patient Goals and CMS Choice Patient states their goals for this hospitalization and ongoing recovery are:: return back home CMS Medicare.gov Compare Post Acute Care list provided to:: Patient Represenative (must comment) Adell Hones- spouse) Choice offered to / list presented to : Spouse      Expected Discharge Plan and Services In-house Referral: Clinical Social Work Discharge Planning Services: CM Consult Post Acute Care Choice: Home Health Living arrangements for the past 2 months: Single Family Home                 DME Arranged: N/A DME Agency: [employer]       HH Arranged: PT   Date HH Agency Contacted: 07/25/23 Time HH Agency Contacted: 1407 Representative spoke with at [facility] Agency: Randel Buss  Prior Living Arrangements/Services Living arrangements for the past 2 months: Single Family Home Lives with:: Spouse Patient language and need for interpreter reviewed:: Yes Do you feel safe going back to the place where you live?: Yes      Need for Family Participation in Patient Care: Yes (Comment) Care giver support system in place?: Yes (comment) Current home services:  DME Criminal Activity/Legal Involvement Pertinent to Current Situation/Hospitalization: No - Comment as needed  Activities of Daily Living   ADL Screening (condition at time of admission) Independently performs ADLs?: No Does the patient have a NEW difficulty with bathing/dressing/toileting/self-feeding that is expected to last >3 days?: Yes (Initiates electronic notice to provider for possible OT consult) Does the patient have a NEW difficulty with getting in/out of bed, walking, or climbing stairs that is expected to last >3 days?: Yes (Initiates electronic notice to provider for possible PT consult) Does the patient have a NEW difficulty with communication that is expected to last >3 days?: No Is the patient deaf or have difficulty hearing?: No Does the patient have difficulty seeing, even when wearing glasses/contacts?: No Does the patient have difficulty concentrating, remembering, or making decisions?: No  Permission Sought/Granted      Share Information with NAME: Adell Hones     Permission granted to share info w Relationship: spouse     Emotional Assessment Appearance:: Appears stated age     Orientation: : Oriented to Self Alcohol / Substance Use: Not Applicable Psych Involvement: No (comment)  Admission diagnosis:  Respiratory failure (HCC) [J96.90] Hyponatremia [E87.1] Pleural effusion [J90] Abscess of left lung with pneumonia, unspecified part of lung (HCC) [J85.1] Pathological fracture of vertebra due to neoplastic disease, initial encounter [A41.66AY] Patient Active Problem List   Diagnosis Date Noted   Respiratory failure (HCC) 07/24/2023   Acute on chronic systolic CHF (congestive heart failure) (  HCC) 07/24/2023   Acute systolic (congestive) heart failure (HCC) 07/08/2023   Decubitus skin ulcer 07/08/2023   Adenocarcinoma (HCC) 06/13/2023   Bilateral pleural effusion 06/07/2023   Hyponatremia 06/07/2023   Transaminitis 06/07/2023   Elevated brain natriuretic  peptide (BNP) level 06/07/2023   Metastatic adenocarcinoma to bone (HCC) 06/07/2023   HCAP (healthcare-associated pneumonia) 06/06/2023   Chest pain 01/13/2023   Immunocompromised patient (HCC) 04/22/2021   Cataract 03/23/2021   Immunization counseling 03/09/2021   Medication monitoring encounter 12/01/2020   TB lung, 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/09/2020   Atrial fibrillation with RVR (HCC) 05/28/2018   Chronic anticoagulation 05/28/2018   NSTEMI (non-ST elevated myocardial infarction) (HCC) 05/09/2018   Ischemic cardiomyopathy 12/04/2016   Leukocytosis 07/17/2016   Pain in joint involving multiple sites 04/25/2016   Anemia 04/16/2016   Coronary artery disease 04/16/2016   History of atrial fibrillation 04/16/2016   Psoriatic arthritis (HCC) 02/14/2016   High risk medication use 02/14/2016   NICM (nonischemic cardiomyopathy) (HCC) 08/29/2015   Essential hypertension 08/29/2015   Abnormal CT scan, stomach    Hiatal hernia    GERD (gastroesophageal reflux disease) 08/01/2015   Weight loss, unintentional 08/01/2015   Iron deficiency anemia 06/06/2015   Heme positive stool 06/06/2015   PAF (paroxysmal atrial fibrillation) (HCC) 05/30/2015   CAD S/P percutaneous coronary angioplasty 04/07/2015   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 [place] [address] Phone: [phone] Fax: [phone]  [facility] - [place], [place] - [address] [place] [address] Phone:  [phone] Fax: [phone]  [facility] [address] [place] - [address] [place] [address] Phone: [phone] Fax: [phone]     Social Drivers of Health (SDOH) Social History: SDOH Screenings   Food Insecurity: No Food Insecurity (07/24/2023)  Housing: Low Risk  (07/24/2023)  Transportation Needs: No Transportation Needs (07/24/2023)  Utilities: Not At Risk (07/24/2023)  Depression (PHQ2-9): Low Risk  (03/09/2021)  Financial Resource Strain: Low Risk  (04/29/2023)   Received from [facility]  Physical Activity: Unknown (04/29/2023)   [facility] from [facility]  Social Connections: Moderately Integrated (07/24/2023)  Stress: No Stress Concern Present (04/29/2023)   Received from [facility]  Tobacco Use: Medium Risk (07/24/2023)   SDOH Interventions:     Readmission Risk Interventions    07/25/2023   12:45 PM 07/09/2023    8:05 AM 07/08/2023    9:44 PM  Readmission Risk Prevention Plan  Transportation Screening Complete Complete Complete  PCP or Specialist Appt within 5-7 Days   Complete  Home Care Screening   Complete  Medication Review (RN CM)   Complete  HRI or Home Care Consult Complete Complete   Social Work Consult for Recovery Care Planning/Counseling Complete Complete   Palliative Care Screening -- Not Applicable   Medication Review Oceanographer) Complete Complete

## 2023-07-25 NOTE — Evaluation (Signed)
 Occupational Therapy Evaluation Patient Details Name: Curtis Flores MRN: [id number] DOB: 1956-01-07 Today's Date: 07/25/2023   History of Present Illness   Curtis Flores is a 68 y.o. male with medical history significant of lung cancer, prostate cancer, paroxysmal atrial fibrillation, pulmonary fibrosis, hypertension, coronary artery disease, and dyslipidemia who presented with dyspnea.   Recent hospitalization 04/4 to 06/13/23 for community acquired pneumonia, found to have a left malignant pleural effusion consistent with primary adenocarcinoma of the lung with extensive sclerotic osseous lesions.   04/24 Outpatient PET scan with positive metastatic diease to bones, live and nodes.   Recent hospitalization 05/06 to 07/14/23 for heart failure decompensation. Patient underwent diuresis and was discharged home.   Today had follow up with Oncology, he was found volume overloaded with 6 Lbs weight increased and was referred to the ED. (per MD)     Clinical Impressions Pt agreeable to OT and PT co-evaluation. Pt reports wife is able to provide support at home. Pt required min A for bed mobility and ambulation in the room. Two hand held assist during transfers with labored movement. B UE weak with limited A/ROM at the shoulder. Pt presenting weaker than recent admission. Max to total assist for lower body dressing based on performance today. Pt left in the chair with the call bell within reach and chair alarm set. Pt will benefit from continued OT in the hospital and recommended venue below to increase strength, balance, and endurance for safe ADL's.        If plan is discharge home, recommend the following:   Assist for transportation;Help with stairs or ramp for entrance;Assistance with cooking/housework;A little help with walking and/or transfers;A lot of help with bathing/dressing/bathroom     Functional Status Assessment   Patient has had a recent decline in their functional status and  demonstrates the ability to make significant improvements in function in a reasonable and predictable amount of time.     Equipment Recommendations   None recommended by OT             Precautions/Restrictions   Precautions Precautions: Fall Recall of Precautions/Restrictions: Intact Restrictions Weight Bearing Restrictions Per Provider Order: No     Mobility Bed Mobility Overal bed mobility: Needs Assistance Bed Mobility: Supine to Sit     Supine to sit: HOB elevated, Min assist     General bed mobility comments: increased time, labored movement    Transfers Overall transfer level: Needs assistance Equipment used: 1 person hand held assist Transfers: Sit to/from Stand, Bed to chair/wheelchair/BSC Sit to Stand: Min assist     Step pivot transfers: Min assist     General transfer comment: Hand held assist for stability; labored movement      Balance Overall balance assessment: Needs assistance Sitting-balance support: Feet supported, No upper extremity supported Sitting balance-Leahy Scale: Fair Sitting balance - Comments: seated at EOB   Standing balance support: During functional activity, Bilateral upper extremity supported (hand held assist) Standing balance-Leahy Scale: Poor Standing balance comment: poor to fair with hand held assist                           ADL either performed or assessed with clinical judgement   ADL Overall ADL's : Needs assistance/impaired     Grooming: Set up;Minimal assistance;Sitting   Upper Body Bathing: Set up;Minimal assistance;Sitting   Lower Body Bathing: Maximal assistance;Total assistance;Sitting/lateral leans   Upper Body Dressing : Set up;Minimal assistance;Sitting  Lower Body Dressing: Maximal assistance;Total assistance;Sitting/lateral leans   Toilet Transfer: Minimal assistance;Ambulation;Stand-pivot;Rolling walker (2 wheels) Toilet Transfer Details (indicate cue type and reason): Chair  to toilet with handheld assist and RW for sit to stand from toilet. Toileting- Clothing Manipulation and Hygiene: Moderate assistance;Maximal assistance;Sitting/lateral lean       Functional mobility during ADLs: Minimal assistance;Rolling walker (2 wheels) (hand held assist)       Vision Baseline Vision/History: 6 Macular Degeneration Ability to See in Adequate Light: 3 Highly impaired Vision Assessment?: No apparent visual deficits     Perception Perception: Not tested       Praxis Praxis: Not tested       Pertinent Vitals/Pain Pain Assessment Pain Assessment: No/denies pain     Extremity/Trunk Assessment Upper Extremity Assessment Upper Extremity Assessment:  (3-/5 bilateral shoulder flexion; generally weak bilaterally.)   Lower Extremity Assessment Lower Extremity Assessment: Defer to PT evaluation   Cervical / Trunk Assessment Cervical / Trunk Assessment: Normal   Communication Communication Communication: No apparent difficulties   Cognition Arousal: Alert Behavior During Therapy: WFL for tasks assessed/performed Cognition: No apparent impairments                               Following commands: Intact       Cueing  General Comments   Cueing Techniques: Verbal cues                 Home Living Family/patient expects to be discharged to:: Private residence Living Arrangements: Spouse/significant other Available Help at Discharge: Family;Available 24 hours/day Type of Home: House Home Access: Level entry     Home Layout: One level     Bathroom Shower/Tub: Chief Strategy Officer: Standard Bathroom Accessibility: No   Home Equipment: Cane - single point   Additional Comments: Same living history as prior admission.      Prior Functioning/Environment Prior Level of Function : Needs assist       Physical Assist : Mobility (physical);ADLs (physical) Mobility (physical): Gait;Transfers ADLs (physical):  IADLs;Toileting;Dressing;Bathing;Grooming Mobility Comments: Pt reports that his wife has had to assist him with mobility since he has returned home from last admission. Pt could ambulate independently prior to 2-3 months agao. ADLs Comments: Pt reports that his wife now has to assist for most ADL's. Decrease the past few months.    OT Problem List: Cardiopulmonary status limiting activity;Decreased strength;Decreased activity tolerance;Impaired balance (sitting and/or standing);Decreased range of motion;Decreased knowledge of use of DME or AE   OT Treatment/Interventions: Self-care/ADL training;Therapeutic exercise;Therapeutic activities;Patient/family education;Balance training;DME and/or AE instruction;Energy conservation;Visual/perceptual remediation/compensation      OT Goals(Current goals can be found in the care plan section)   Acute Rehab OT Goals Patient Stated Goal: return home OT Goal Formulation: With patient Time For Goal Achievement: 08/08/23 Potential to Achieve Goals: Good   OT Frequency:  Min 2X/week    Co-evaluation PT/OT/SLP Co-Evaluation/Treatment: Yes Reason for Co-Treatment: To address functional/ADL transfers   OT goals addressed during session: ADL's and self-care                       End of Session Equipment Utilized During Treatment: Rolling walker (2 wheels)  Activity Tolerance: Patient tolerated treatment well Patient left: in chair;with call bell/phone within reach  OT Visit Diagnosis: Unsteadiness on feet (R26.81);Other abnormalities of gait and mobility (R26.89);Muscle weakness (generalized) (M62.81)  Time: 1610-9604 OT Time Calculation (min): 14 min Charges:  OT General Charges $OT Visit: 1 Visit OT Evaluation $OT Eval Low Complexity: 1 Low  Samantha Olivera OT, MOT   Thurnell Floss 07/25/2023, 1:08 PM

## 2023-07-25 NOTE — Plan of Care (Signed)
  Problem: Acute Rehab OT Goals (only OT should resolve) Goal: Pt. Will Perform Grooming Flowsheets (Taken 07/25/2023 1312) Pt Will Perform Grooming:  with supervision  standing Goal: Pt. Will Perform Upper Body Dressing Flowsheets (Taken 07/25/2023 1312) Pt Will Perform Upper Body Dressing:  with modified independence  sitting Goal: Pt. Will Perform Lower Body Dressing Flowsheets (Taken 07/25/2023 1312) Pt Will Perform Lower Body Dressing:  with modified independence  sitting/lateral leans Goal: Pt. Will Transfer To Toilet Flowsheets (Taken 07/25/2023 1312) Pt Will Transfer to Toilet:  ambulating  with supervision Goal: Pt. Will Perform Toileting-Clothing Manipulation Flowsheets (Taken 07/25/2023 1312) Pt Will Perform Toileting - Clothing Manipulation and hygiene:  with modified independence  sitting/lateral leans Goal: Pt/Caregiver Will Perform Home Exercise Program Flowsheets (Taken 07/25/2023 1312) Pt/caregiver will Perform Home Exercise Program:  Increased ROM  Increased strength  Both right and left upper extremity  Independently  Zalma Channing OT, MOT

## 2023-07-25 NOTE — Progress Notes (Signed)
 eLink Physician-Brief Progress Note Patient Name: Curtis Flores DOB: 04/11/1955 MRN: [id number]   Date of Service  07/25/2023  HPI/Events of Note  68 year old male with fairly recent diagnosis of metastatic lung cancer, HFrEF, pulm fibrosis with recent hospitalizations for pna and CHF.  Cancer diagnosed during hospitalization in April referred to hospital from oncology clinic.  Evaluation revealed sodium 121, wbc 17.6.  CT chest with complete left lung collapse.  Loculated left effusion, left mainstem occlusion,  focal areas of abscess/necrosis left lung, right pleural effusion,  possible pna on right, lytic bone lesions, and adrenal mass. Review of oncology note from 5/22 shows hospice recommended.    eICU Interventions  Chart reviewed Agree with plans  Needs palliative care and discussion about advance directives     Intervention Category Evaluation Type: New Patient Evaluation  Linda Repress, P 07/25/2023, 12:03 AM

## 2023-07-25 NOTE — Progress Notes (Addendum)
 Spoke to Curtis Flores about Code Status. It was clear that he understood fully, Curtis Flores was able to repeat back to me what each meant. He expressed his desire to be made DNR. MD made aware.

## 2023-07-25 NOTE — Progress Notes (Signed)
 Progress Note   Patient: Curtis Flores UJW:[id number] DOB: 10/21/1955 DOA: 07/24/2023     1 DOS: the patient was seen and examined on 07/25/2023   Brief hospital admission narrative course: As per H&P written by Dr. Sunnie England on 07/23/2021  Curtis Flores is a 68 y.o. male with medical history significant of lung cancer, prostate cancer, paroxysmal atrial fibrillation, pulmonary fibrosis, hypertension, coronary artery disease, and dyslipidemia who presented with dyspnea.  Recent hospitalization 04/4 to 06/13/23 for community acquired pneumonia, found to have a left malignant pleural effusion consistent with primary adenocarcinoma of the lung with extensive sclerotic osseous lesions.  04/24 Outpatient PET scan with positive metastatic diease to bones, live and nodes.  Recent hospitalization 05/06 to 07/14/23 for heart failure decompensation. Patient underwent diuresis and was discharged home.  Today had follow up with Oncology, he was found volume overloaded with 6 Lbs weight increased and was referred to the ED.    Patient reports about 7 to 10 days of worsening dyspnea on exertion, associated with lower extremity edema, PND and orthopnea. Poor oral intake and generalized weakness. His dyspnea continue to progress very rapidly, specially over the last 2 days,  to the point where was symptomatic with minimal efforts. Note able to move independently.     At home uses supplemental 02 per Love 02 2 L/min. He uses a walker for ambulation. He lives with his wife at home.    Assessment and plan Acute on chronic systolic CHF (congestive heart failure) (HCC) - Recent 2D echo echocardiogram with reduced LV systolic function with EF 35 to 40,, RV systolic function preserved, RVSP 52 mmHg, small pericardial effusion, no significant valvular disease.  - Continue IV diuresis - Follow daily weights, and strict I's and O's and low-sodium diet. - Follow clinical response.  HCAP (healthcare-associated  pneumonia)/postobstructive pneumonia -Continue current antibiotics - In order to assist with bronchodilator management nebulizer treatments and steroids will be provided - Continue Mucinex .  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 - Case discussed with patient and patient's daughter currently DNR/DNI - Will continue to stabilize as much as possible, with plans to discharge home with hospice and offer comfort and symptomatic management.  Pulmonary fibrosis (HCC) -Chronic hypoxemic respiratory failure.  -Imuran  will be discontinued - Continue bronchodilator management - Continue oxygen supplementation.  Atrial flutter (HCC)/atrial fibrillation -Continue telemetry monitoring - Will resume metoprolol  - Follow clinical response.  Essential hypertension -Overall stable - Continue current antihypertensive agents.  Mixed hyperlipidemia -Overall main goal is for comfort - Statins will be discontinued to minimize multiple pills.  Rheumatoid arthritis (HCC) -Steroids added as part of treatment for bronchodilator and pulmonary fibrosis. - Imuran  be discontinued - Continue as needed analgesics.  GERD (gastroesophageal reflux disease) -Continue PPI.  Hyponatremia -Most likely associated with hypervolemia - Continue diuresis and follow electrolytes trend.  Stage II pressure injury in his buttocks bilaterally - Continue constant repositioning - No signs of superimposed infection - Pressure injuries present at time of admission.  Subjective:  No chest pain, no nausea vomiting.  Demonstrated difficulty speaking in full sentences and feeling short winded.  Physical Exam: Vitals:   07/25/23 1300 07/25/23 1338 07/25/23 1400 07/25/23 1500  BP:    (!) 151/54  Pulse:   97 (!) 103   Resp: (!) 27  (!) 21 (!) 23  Temp:      TempSrc:      SpO2: 100% 100% 100% 100%  Weight:      Height:       General exam: Afebrile.  For the most part in no major distress.  Denies chest pain. Respiratory system: Decreased breath sounds on the left; positive expiratory wheezing and rhonchi appreciated on exam.  Good saturation on chronic supplementation. Cardiovascular system: Irregular, no rubs, no gallops, no JVD. Gastrointestinal system: Abdomen is nondistended, soft and nontender. No organomegaly or masses felt. Normal bowel sounds heard. Central nervous system: No focal neurological deficits. Extremities: No cyanosis or clubbing; 1-2+ edema appreciated bilaterally. Skin: No petechiae.  Stage II pressure injury in his buttocks bilaterally present at time of admission.  No signs of superimposed infection. Psychiatry: Judgement and insight appear normal.  Flat affect appreciated.   Data Reviewed: Basic metabolic panel: Sodium 124, potassium 4.1, chloride 79, bicarb 37, BUN 16, creatinine 0.56 and GFR >60 CBC: WBC 18.7, hemoglobin 10.8 and platelet counts 259K  Family Communication: Daughter updated over the phone.  Disposition: Status is: Inpatient Remains inpatient appropriate because: Continue IV therapy.   Time spent: 50 minutes  Author: Justina Oman, MD 07/25/2023 5:20 PM  For on call review [email or web address].

## 2023-07-25 NOTE — Evaluation (Signed)
 Physical Therapy Evaluation Patient Details Name: Curtis Flores MRN: [id number] DOB: 1955-06-22 Today's Date: 07/25/2023  History of Present Illness  Curtis Flores is a 68 y.o. male with medical history significant of lung cancer, prostate cancer, paroxysmal atrial fibrillation, pulmonary fibrosis, hypertension, coronary artery disease, and dyslipidemia who presented with dyspnea.   Recent hospitalization 04/4 to 06/13/23 for community acquired pneumonia, found to have a left malignant pleural effusion consistent with primary adenocarcinoma of the lung with extensive sclerotic osseous lesions.   04/24 Outpatient PET scan with positive metastatic diease to bones, live and nodes.   Recent hospitalization 05/06 to 07/14/23 for heart failure decompensation. Patient underwent diuresis and was discharged home.   Today had follow up with Oncology, he was found volume overloaded with 6 Lbs weight increased and was referred to the ED.   Clinical Impression  Patient requires HOB raised for sitting up at bedside, able to ambulate short distances in room with hand held assist, had difficulty sitting up from the commode and required use of RW and Min assist and tolerated sitting up in chair after therapy. Patient will benefit from continued skilled physical therapy in hospital and recommended venue below to increase strength, balance, endurance for safe ADLs and gait.         If plan is discharge home, recommend the following: A little help with walking and/or transfers;A little help with bathing/dressing/bathroom;Help with stairs or ramp for entrance;Assistance with cooking/housework;Assist for transportation   Can travel by private vehicle        Equipment Recommendations None recommended by PT  Recommendations for Other Services       Functional Status Assessment Patient has had a recent decline in their functional status and demonstrates the ability to make significant improvements in function in a  reasonable and predictable amount of time.     Precautions / Restrictions Precautions Precautions: Fall Recall of Precautions/Restrictions: Intact Restrictions Weight Bearing Restrictions Per Provider Order: No      Mobility  Bed Mobility Overal bed mobility: Needs Assistance Bed Mobility: Supine to Sit     Supine to sit: HOB elevated, Min assist Sit to supine: HOB elevated, Contact guard assist, Supervision   General bed mobility comments: increased time, labored movement    Transfers Overall transfer level: Needs assistance Equipment used: 1 person hand held assist Transfers: Sit to/from Stand, Bed to chair/wheelchair/BSC Sit to Stand: Min assist   Step pivot transfers: Min assist       General transfer comment: slow labored movement    Ambulation/Gait Ambulation/Gait assistance: Contact guard assist, Min assist Gait Distance (Feet): 12 Feet Assistive device: 1 person hand held assist Gait Pattern/deviations: Decreased step length - right, Decreased step length - left, Decreased stride length Gait velocity: slow     General Gait Details: slow labored movement requiring hand held assist for safety, no loss of balance  Stairs            Wheelchair Mobility     Tilt Bed    Modified Rankin (Stroke Patients Only)       Balance Overall balance assessment: Needs assistance Sitting-balance support: Feet supported, No upper extremity supported Sitting balance-Leahy Scale: Fair Sitting balance - Comments: fair/good seated at EOB   Standing balance support: During functional activity, No upper extremity supported Standing balance-Leahy Scale: Poor Standing balance comment: fair/poor without AD, fair/good using RW  Pertinent Vitals/Pain Pain Assessment Pain Assessment: No/denies pain    Home Living Family/patient expects to be discharged to:: Private residence Living Arrangements: Spouse/significant  other Available Help at Discharge: Family;Available 24 hours/day Type of Home: House Home Access: Level entry       Home Layout: One level Home Equipment: Cane - single point Additional Comments: Same living history as prior admission.    Prior Function Prior Level of Function : Needs assist       Physical Assist : Mobility (physical);ADLs (physical) Mobility (physical): Gait;Transfers;Bed mobility ADLs (physical): IADLs;Toileting;Dressing;Bathing;Grooming Mobility Comments: Pt reports that his wife has had to assist him with mobility since he has returned home from last admission. Pt could ambulate independently prior to 2-3 months agao. ADLs Comments: Pt reports that his wife now has to assist for most ADL's. Decrease the past few months.     Extremity/Trunk Assessment   Upper Extremity Assessment Upper Extremity Assessment: Defer to OT evaluation    Lower Extremity Assessment Lower Extremity Assessment: Generalized weakness    Cervical / Trunk Assessment Cervical / Trunk Assessment: Normal  Communication   Communication Communication: No apparent difficulties    Cognition Arousal: Alert Behavior During Therapy: WFL for tasks assessed/performed   PT - Cognitive impairments: No apparent impairments                         Following commands: Intact       Cueing Cueing Techniques: Verbal cues     General Comments      Exercises     Assessment/Plan    PT Assessment Patient needs continued PT services  PT Problem List Decreased strength;Decreased activity tolerance;Decreased balance;Decreased mobility       PT Treatment Interventions DME instruction;Gait training;Stair training;Functional mobility training;Therapeutic activities;Therapeutic exercise;Balance training;Patient/family education    PT Goals (Current goals can be found in the Care Plan section)  Acute Rehab PT Goals Patient Stated Goal: return home with family to assist PT Goal  Formulation: With patient Time For Goal Achievement: 07/16/2023 Potential to Achieve Goals: Good    Frequency Min 3X/week     Co-evaluation PT/OT/SLP Co-Evaluation/Treatment: Yes Reason for Co-Treatment: To address functional/ADL transfers PT goals addressed during session: Mobility/safety with mobility;Balance;Proper use of DME OT goals addressed during session: ADL's and self-care       AM-PAC PT "6 Clicks" Mobility  Outcome Measure Help needed turning from your back to your side while in a flat bed without using bedrails?: A Little Help needed moving from lying on your back to sitting on the side of a flat bed without using bedrails?: A Little Help needed moving to and from a bed to a chair (including a wheelchair)?: A Little Help needed standing up from a chair using your arms (e.g., wheelchair or bedside chair)?: A Little Help needed to walk in hospital room?: A Little Help needed climbing 3-5 steps with a railing? : A Lot 6 Click Score: 17    End of Session Equipment Utilized During Treatment: Oxygen Activity Tolerance: Patient tolerated treatment well;Patient limited by fatigue Patient left: in chair;with call bell/phone within reach Nurse Communication: Mobility status PT Visit Diagnosis: Unsteadiness on feet (R26.81);Other abnormalities of gait and mobility (R26.89);Muscle weakness (generalized) (M62.81)    Time: 2130-8657 PT Time Calculation (min) (ACUTE ONLY): 21 min   Charges:   PT Evaluation $PT Eval Moderate Complexity: 1 Mod PT Treatments $Therapeutic Activity: 8-22 mins PT General Charges $$ ACUTE PT VISIT: 1 Visit  1:51 PM, 07/25/23 Walton Guppy, MPT Physical Therapist with [facility] [phone] office [phone] mobile phone

## 2023-07-25 NOTE — Progress Notes (Signed)
 Limited US  of the left and right chest shows scant pleural fluid bilaterally. On the left, the fluid is very inferior and lateral and is noted to have multiple loculations. The remainder of the left chest does not show free fluid with significant atelectasis throughout. Due to the presence of many small loculations it is highly unlikely that any significant amount of fluid could be obtained from the left and there is insufficient fluid on the right to allow for safe thoracentesis, therefore no procedure to be performed at this time.   Images are available for review under imaging section of Epic.  Please call with questions or concerns.   Marilu Shown

## 2023-07-26 DIAGNOSIS — E782 Mixed hyperlipidemia: Secondary | ICD-10-CM | POA: Diagnosis not present

## 2023-07-26 DIAGNOSIS — I5023 Acute on chronic systolic (congestive) heart failure: Secondary | ICD-10-CM | POA: Diagnosis not present

## 2023-07-26 DIAGNOSIS — M05741 Rheumatoid arthritis with rheumatoid factor of right hand without organ or systems involvement: Secondary | ICD-10-CM | POA: Diagnosis not present

## 2023-07-26 DIAGNOSIS — K219 Gastro-esophageal reflux disease without esophagitis: Secondary | ICD-10-CM | POA: Diagnosis not present

## 2023-07-26 NOTE — Progress Notes (Signed)
 1308  Patient eating breakfast, unavailable for nebulizer at this time.

## 2023-07-26 NOTE — Progress Notes (Signed)
 Progress Note   Patient: Curtis Flores ZOX:[id number] DOB: 1955-06-12 DOA: 07/24/2023     2 DOS: the patient was seen and examined on 07/26/2023   Brief hospital admission narrative course: As per H&P written by Dr. Sunnie England on 07/23/2021  FRANKO HILLIKER is a 68 y.o. male with medical history significant of lung cancer, prostate cancer, paroxysmal atrial fibrillation, pulmonary fibrosis, hypertension, coronary artery disease, and dyslipidemia who presented with dyspnea.  Recent hospitalization 04/4 to 06/13/23 for community acquired pneumonia, found to have a left malignant pleural effusion consistent with primary adenocarcinoma of the lung with extensive sclerotic osseous lesions.  04/24 Outpatient PET scan with positive metastatic diease to bones, live and nodes.  Recent hospitalization 05/06 to 07/14/23 for heart failure decompensation. Patient underwent diuresis and was discharged home.  Today had follow up with Oncology, he was found volume overloaded with 6 Lbs weight increased and was referred to the ED.    Patient reports about 7 to 10 days of worsening dyspnea on exertion, associated with lower extremity edema, PND and orthopnea. Poor oral intake and generalized weakness. His dyspnea continue to progress very rapidly, specially over the last 2 days,  to the point where was symptomatic with minimal efforts. Note able to move independently.     At home uses supplemental 02 per [place] 02 2 L/min. He uses a walker for ambulation. He lives with his wife at home.    Assessment and plan Acute on chronic systolic CHF (congestive heart failure) (HCC) - Recent 2D echo echocardiogram with reduced LV systolic function with EF 35 to 40,, RV systolic function preserved, RVSP 52 mmHg, small pericardial effusion, no significant valvular disease.  - Continue IV diuresis. - Follow daily weights, and strict I's and O's and low-sodium diet. - Continue to follow clinical response.  HCAP (healthcare-associated  pneumonia)/postobstructive pneumonia -Continue current antibiotics; given negative MRSA PCR vancomycin  has been discontinued. - In order to assist with bronchodilator management nebulizer treatments and steroids will be provided - Continue Mucinex .  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 - Case discussed with patient and patient's daughter currently DNR/DNI - Will continue to stabilize as much as possible, with plans to discharge home with hospice and offer comfort and symptomatic management.  Pulmonary fibrosis (HCC) -Chronic hypoxemic respiratory failure.  -Imuran  will be discontinued - Continue bronchodilator management - Continue oxygen supplementation.  Atrial flutter (HCC)/atrial fibrillation -Continue telemetry monitoring - Will resume metoprolol  - Follow clinical response.  Essential hypertension -Overall stable - Continue current antihypertensive agents.  Mixed hyperlipidemia -Overall main goal is for comfort - Statins will be discontinued to minimize multiple pills.  Rheumatoid arthritis (HCC) -Steroids added as part of treatment for bronchodilator and pulmonary fibrosis. - Imuran  be discontinued - Continue as needed analgesics.  GERD (gastroesophageal reflux disease) -Continue PPI.  Hyponatremia -Most likely associated with hypervolemia - Continue diuresis and follow electrolytes trend.  Stage II pressure injury in his buttocks bilaterally - Continue constant repositioning - No signs of superimposed infection - Pressure injuries present at time of admission.  Subjective:  No chest pain, no nausea, no vomiting.  Patient reports feeling slightly better.  Still short winded with activity and demonstrating difficulty speaking in full  sentences.  Physical Exam: Vitals:   07/26/23 0821 07/26/23 0903 07/26/23 1338 07/26/23 1414  BP: (!) 144/66  115/65   Pulse:   90   Resp:      Temp: (!) 97.5 F (36.4 C)  (!) 97.4 F (36.3 C)   TempSrc: Oral  Oral   SpO2: 100% 99% 97% 97%  Weight:      Height:       General exam: Alert, awake, oriented x 3; feeling slightly better but is still short winded.  Demonstrating difficulty speaking in full sentences. Respiratory system: Mild expiratory wheezing and fine crackles at the bases.  No using accessory muscles.  Positive rhonchi bilaterally. Cardiovascular system: Irregular, no rubs, no gallops, no JVD. Gastrointestinal system: Abdomen is nondistended, soft and nontender. No organomegaly or masses felt. Normal bowel sounds heard. Central nervous system: Generally weak.  No focal neurological deficits. Extremities: No cyanosis or clubbing; 1+ edema appreciated bilaterally. Skin: No petechiae.  Stage II pressure injury in his buttocks bilaterally present at time of admission, without signs of superimposed infection. Psychiatry: Judgement and insight appear normal.   Latest data Reviewed: Basic metabolic panel: Sodium 124, potassium 4.1, chloride 79, bicarb 37, BUN 16, creatinine 0.56 and GFR >60 CBC: WBC 18.7, hemoglobin 10.8 and platelet counts 259K  Family Communication: Wife updated at bedside.  Disposition: Status is: Inpatient Remains inpatient appropriate because: Continue IV therapy.  Time spent: 50 minutes  Author: Justina Oman, MD 07/26/2023 3:34 PM  For on call review [email or web address].

## 2023-07-26 NOTE — Plan of Care (Signed)

## 2023-07-26 NOTE — Plan of Care (Signed)
   Problem: Activity: Goal: Risk for activity intolerance will decrease Outcome: Progressing   Problem: Coping: Goal: Level of anxiety will decrease Outcome: Progressing   Problem: Safety: Goal: Ability to remain free from injury will improve Outcome: Progressing

## 2023-07-27 DIAGNOSIS — E782 Mixed hyperlipidemia: Secondary | ICD-10-CM | POA: Diagnosis not present

## 2023-07-27 DIAGNOSIS — M05741 Rheumatoid arthritis with rheumatoid factor of right hand without organ or systems involvement: Secondary | ICD-10-CM | POA: Diagnosis not present

## 2023-07-27 DIAGNOSIS — I5023 Acute on chronic systolic (congestive) heart failure: Secondary | ICD-10-CM | POA: Diagnosis not present

## 2023-07-27 DIAGNOSIS — K219 Gastro-esophageal reflux disease without esophagitis: Secondary | ICD-10-CM | POA: Diagnosis not present

## 2023-07-27 LAB — BASIC METABOLIC PANEL WITH GFR
Anion gap: 9 (ref 5–15)
BUN: 21 mg/dL (ref 8–23)
CO2: 42 mmol/L — ABNORMAL HIGH (ref 22–32)
Calcium: 9 mg/dL (ref 8.9–10.3)
Chloride: 78 mmol/L — ABNORMAL LOW (ref 98–111)
Creatinine, Ser: 0.52 mg/dL — ABNORMAL LOW (ref 0.61–1.24)
GFR, Estimated: >60 mL/min (ref >60)
Glucose, Bld: 93 mg/dL (ref 70–99)
Potassium: 4.3 mmol/L (ref 3.5–5.1)
Sodium: 129 mmol/L — ABNORMAL LOW (ref 135–145)

## 2023-07-27 LAB — CBC
HCT: 35 % — ABNORMAL LOW (ref 39.0–52.0)
Hemoglobin: 10.8 g/dL — ABNORMAL LOW (ref 13.0–17.0)
MCH: 27.3 pg (ref 26.0–34.0)
MCHC: 30.9 g/dL (ref 30.0–36.0)
MCV: 88.6 fL (ref 80.0–100.0)
Platelets: 266 10*3/uL (ref 150–400)
RBC: 3.95 MIL/uL — ABNORMAL LOW (ref 4.22–5.81)
RDW: 17.4 % — ABNORMAL HIGH (ref 11.5–15.5)
WBC: 16.2 10*3/uL — ABNORMAL HIGH (ref 4.0–10.5)
nRBC: 0 % (ref 0.0–0.2)

## 2023-07-27 LAB — PHOSPHORUS: Phosphorus: 3.2 mg/dL (ref 2.5–4.6)

## 2023-07-27 LAB — MAGNESIUM: Magnesium: 2.3 mg/dL (ref 1.7–2.4)

## 2023-07-27 MED ORDER — MORPHINE SULFATE (CONCENTRATE) 10 MG /0.5 ML PO SOLN: 5.0000 mg | Freq: Four times a day (QID) | ORAL | Status: DC | PRN

## 2023-07-27 MED ORDER — FOOD THICKENER (SIMPLYTHICK)
1.0000 | ORAL | Status: DC | PRN
  Filled 2023-07-27: qty 1

## 2023-07-27 MED ORDER — SCOPOLAMINE 1 MG/3DAYS TD PT72
1.0000 | MEDICATED_PATCH | TRANSDERMAL | Status: DC
  Administered 2023-07-27: 1.5 mg via TRANSDERMAL
  Filled 2023-07-27: qty 1

## 2023-07-27 NOTE — Plan of Care (Signed)

## 2023-07-27 NOTE — Plan of Care (Signed)
   Problem: Activity: Goal: Risk for activity intolerance will decrease Outcome: Progressing   Problem: Coping: Goal: Level of anxiety will decrease Outcome: Progressing   Problem: Safety: Goal: Ability to remain free from injury will improve Outcome: Progressing

## 2023-07-27 NOTE — Progress Notes (Signed)
 Progress Note   Patient: Curtis Flores GNF:[id number] DOB: [birth year] DOA: 07/24/2023     3 DOS: the patient was seen and examined on 07/27/2023   Brief hospital admission narrative course: As per H&P written by Dr. Sunnie England on 07/23/2021  Curtis Flores is a 69 y.o. male with medical history significant of lung cancer, prostate cancer, paroxysmal atrial fibrillation, pulmonary fibrosis, hypertension, coronary artery disease, and dyslipidemia who presented with dyspnea.  Recent hospitalization 04/4 to 06/13/23 for community acquired pneumonia, found to have a left malignant pleural effusion consistent with primary adenocarcinoma of the lung with extensive sclerotic osseous lesions.  04/24 Outpatient PET scan with positive metastatic diease to bones, live and nodes.  Recent hospitalization 05/06 to 07/14/23 for heart failure decompensation. Patient underwent diuresis and was discharged home.  Today had follow up with Oncology, he was found volume overloaded with 6 Lbs weight increased and was referred to the ED.    Patient reports about 7 to 10 days of worsening dyspnea on exertion, associated with lower extremity edema, PND and orthopnea. Poor oral intake and generalized weakness. His dyspnea continue to progress very rapidly, specially over the last 2 days,  to the point where was symptomatic with minimal efforts. Note able to move independently.     At home uses supplemental 02 per [place] 02 2 L/min. He uses a walker for ambulation. He lives with his wife at home.    Assessment and plan Acute on chronic systolic CHF (congestive heart failure) (HCC) - Recent 2D echo echocardiogram with reduced LV systolic function with EF 35 to 40,, RV systolic function preserved, RVSP 52 mmHg, small pericardial effusion, no significant valvular disease.  - Continue IV diuresis. - Follow daily weights, and strict I's and O's and low-sodium diet. - Continue to follow clinical response.  HCAP (healthcare-associated  pneumonia)/postobstructive pneumonia -Continue current antibiotics; given negative MRSA PCR vancomycin  has been discontinued. - In order to assist with bronchodilator management nebulizer treatments and steroids will be provided - Continue Mucinex .  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 - Case discussed with patient and patient's daughter currently DNR/DNI - Will continue to stabilize as much as possible, with plans to discharge home with hospice and offer comfort and symptomatic management.  Pulmonary fibrosis (HCC) -Chronic hypoxemic respiratory failure.  -Imuran  will be discontinued - Continue bronchodilator management - Continue oxygen supplementation.  Atrial flutter (HCC)/atrial fibrillation -Continue telemetry monitoring - Will resume metoprolol  - Follow clinical response.  Essential hypertension -Overall stable - Continue current antihypertensive agents.  Mixed hyperlipidemia -Overall main goal is for comfort - Statins will be discontinued to minimize multiple pills.  Rheumatoid arthritis (HCC) -Steroids added as part of treatment for bronchodilator and pulmonary fibrosis. - Imuran  be discontinued - Continue as needed analgesics.  GERD (gastroesophageal reflux disease) -Continue PPI.  Hyponatremia -Most likely associated with hypervolemia - Continue diuresis and follow electrolytes trend.  Stage II pressure injury in his buttocks bilaterally - Continue constant repositioning - No signs of superimposed infection - Pressure injuries present at time of admission.  Air hunger/increased secretion - Low-dose Roxanol and a scopolamine patch has been ordered - Continue supportive care  Difficulty with thin liquids - Speech therapy evaluation has been  recommended - Nectar thick liquids has been allowed evaluation.  Subjective:  No chest pain, no nausea vomiting.  Still short winded with activity and at times reporting air hunger symptoms.  Transient episode of aspiration with thin liquids and also expressed some difficulty controlling increased secretions.  Physical Exam: Vitals:   07/27/23 0333 07/27/23 0900 07/27/23 1312 07/27/23 1351  BP: 137/79  116/61   Pulse: 88  93   Resp:      Temp:   (!) 97.5 F (36.4 C)   TempSrc:   Oral   SpO2: 100% 100% 96% 96%  Weight:      Height:       General exam: Alert, awake, oriented x 3; with episode of aspiration with thin liquids.  Overall reporting improvement in his breathing.  No fever Respiratory system: Decreased breath sounds at the bases; positive rhonchi, no wheezing Cardiovascular system: Rate controlled, no rubs, no gallops, no JVD. Gastrointestinal system: Abdomen is nondistended, soft and nontender. No organomegaly or masses felt. Normal bowel sounds heard. Central nervous system: No focal neurological deficits. Extremities: No cyanosis or clubbing; 1+ edema appreciated bilaterally. Skin: No petechiae.  Stage II pressure injury in his buttocks bilaterally present at time of admission; no signs of superimposed infection. Psychiatry: Judgement and insight appear normal.  Flat affect on exam.  Latest data Reviewed: CBC: WBC 16.2, hemoglobin 10.8 and platelet count 266K Basic metabolic panel: Sodium 129, potassium 4.3, chloride 78, bicarb 42, BUN 21, creatinine 0.52 and GFR >60 Magnesium : 2.3  Family Communication: Wife updated at bedside.  Disposition: Status is: Inpatient Remains inpatient appropriate because: Continue IV therapy.  Time spent: 50 minutes  Author: Justina Oman, MD 07/27/2023 5:21 PM  For on call review [email or web address].

## 2023-07-27 NOTE — Progress Notes (Signed)
 Pt requesting for nebulizers at a later time due to it also being time for breakfast.

## 2023-07-28 ENCOUNTER — Encounter (HOSPITAL_COMMUNITY): Payer: Self-pay | Admitting: Internal Medicine

## 2023-07-28 DIAGNOSIS — J90 Pleural effusion, not elsewhere classified: Secondary | ICD-10-CM | POA: Diagnosis not present

## 2023-07-28 DIAGNOSIS — Z7189 Other specified counseling: Secondary | ICD-10-CM | POA: Diagnosis not present

## 2023-07-28 DIAGNOSIS — Z515 Encounter for palliative care: Secondary | ICD-10-CM

## 2023-07-28 DIAGNOSIS — I5023 Acute on chronic systolic (congestive) heart failure: Secondary | ICD-10-CM | POA: Diagnosis not present

## 2023-07-28 DIAGNOSIS — E782 Mixed hyperlipidemia: Secondary | ICD-10-CM | POA: Diagnosis not present

## 2023-07-28 DIAGNOSIS — J189 Pneumonia, unspecified organism: Secondary | ICD-10-CM | POA: Diagnosis not present

## 2023-07-28 LAB — BASIC METABOLIC PANEL WITH GFR
Anion gap: 18 — ABNORMAL HIGH (ref 5–15)
BUN: 22 mg/dL (ref 8–23)
CO2: 39 mmol/L — ABNORMAL HIGH (ref 22–32)
Calcium: 9.1 mg/dL (ref 8.9–10.3)
Chloride: 75 mmol/L — ABNORMAL LOW (ref 98–111)
Creatinine, Ser: 0.52 mg/dL — ABNORMAL LOW (ref 0.61–1.24)
GFR, Estimated: >60 mL/min (ref >60)
Glucose, Bld: 71 mg/dL (ref 70–99)
Potassium: 3.6 mmol/L (ref 3.5–5.1)
Sodium: 132 mmol/L — ABNORMAL LOW (ref 135–145)

## 2023-07-28 MED ORDER — LORAZEPAM 2 MG/ML IJ SOLN
1.0000 mg | Freq: Three times a day (TID) | INTRAMUSCULAR | Status: DC | PRN
  Filled 2023-07-28: qty 1

## 2023-07-28 MED ORDER — IPRATROPIUM-ALBUTEROL 0.5-2.5 (3) MG/3ML IN SOLN: 3.0000 mL | Freq: Four times a day (QID) | RESPIRATORY_TRACT | Status: DC | PRN

## 2023-07-29 LAB — CULTURE, BLOOD (ROUTINE X 2)
Culture: NO GROWTH
Culture: NO GROWTH

## 2023-08-03 NOTE — TOC Progression Note (Signed)
 Transition of Care [facility]) - Progression Note    Patient Details  Name: Curtis Flores MRN: [id number] Date of Birth: 10/12/1955  Transition of Care [facility]) CM/SW Contact  Ander Katos, [place] Phone Number: 08/02/2023, 11:38 AM  Clinical Narrative:  Per MD, pt agreeable to hospice at home. LCSW met with pt at bedside to discuss. Pt open to hospice at home and has no preference on agency. Referred to [facility] with [facility] who will follow up with pt in AM. TOC will follow.      Expected Discharge Plan: Home w Home Health Services Barriers to Discharge: Continued Medical Work up  Expected Discharge Plan and Services In-house Referral: Clinical Social Work Discharge Planning Services: CM Consult Post Acute Care Choice: Home Health Living arrangements for the past 2 months: Single Family Home                 DME Arranged: N/A DME Agency: [employer]       HH Arranged: PT   Date HH Agency Contacted: 07/25/23 Time HH Agency Contacted: 1407 Representative spoke with at [facility]: Randel Buss   Social Determinants of Health (SDOH) Interventions SDOH Screenings   Food Insecurity: No Food Insecurity (07/24/2023)  Housing: Low Risk  (07/24/2023)  Transportation Needs: No Transportation Needs (07/24/2023)  Utilities: Not At Risk (07/24/2023)  Depression (PHQ2-9): Low Risk  (03/09/2021)  Financial Resource Strain: Low Risk  (04/29/2023)   Received from [facility]  Physical Activity: Unknown (04/29/2023)   Received from [facility]  Social Connections: Moderately Integrated (07/24/2023)  Stress: No Stress Concern Present (04/29/2023)   Received from [facility]  Tobacco Use: Medium Risk (07/24/2023)    Readmission Risk Interventions    07/25/2023   12:45 PM 07/09/2023    8:05 AM 07/08/2023    9:44 PM  Readmission Risk Prevention Plan  Transportation Screening Complete Complete Complete  PCP or Specialist Appt within 5-7 Days   Complete  Home Care Screening   Complete   Medication Review (RN CM)   Complete  HRI or Home Care Consult Complete Complete   Social Work Consult for Recovery Care Planning/Counseling Complete Complete   Palliative Care Screening -- Not Applicable   Medication Review Oceanographer) Complete Complete

## 2023-08-03 NOTE — Progress Notes (Signed)
 Nurse at bedside, patient has expired time of death 14:30 pm.Time of death was called by Estle Hemp RN. Dr Michaelene Admire notifed of patient change in status. Family at the bedside. MD arrived to room to speak with the family.

## 2023-08-03 NOTE — Evaluation (Signed)
 Clinical/Bedside Swallow Evaluation Patient Details  Name: Curtis Flores MRN: [id number] Date of Birth: Sep 21, 1955  Today's Date: 07/16/2023 Time: SLP Start Time (ACUTE ONLY): 1132 SLP Stop Time (ACUTE ONLY): 1154 SLP Time Calculation (min) (ACUTE ONLY): 22 min  Past Medical History:  Past Medical History:  Diagnosis Date   Abnormal CT scan, stomach    Chronic systolic dysfunction of left ventricle    Coronary artery disease    lateral STEMI 02/22/15 3/10 cutting balloon to ISR of the o/pLCX   GERD (gastroesophageal reflux disease)    Hyperlipidemia    Hypertension    Ischemic cardiomyopathy    LBBB (left bundle branch block)    Leukocytosis    followed by hematology, reactive   Lymphadenopathy    Myocardial infarction (HCC) 02/2015   Psoriasis 2003   Psoriatic arthritis (HCC)    Pulmonary fibrosis (HCC)    Rheumatoid arthritis(714.0) 2012   Typical atrial flutter (HCC)    Past Surgical History:  Past Surgical History:  Procedure Laterality Date   CARDIAC CATHETERIZATION N/A 02/22/2015   Procedure: Left Heart Cath and Coronary Angiography;  Surgeon: Avanell Leigh, MD;  Location: [facility] INVASIVE CV LAB;  Service: Cardiovascular;  Laterality: N/A;   CARDIAC CATHETERIZATION N/A 02/22/2015   Procedure: Coronary Stent Intervention;  Surgeon: Avanell Leigh, MD;  Location: [facility] INVASIVE CV LAB;  Service: Cardiovascular;  Laterality: N/A;   CARDIOVERSION N/A 04/12/2015   Procedure: CARDIOVERSION;  Surgeon: Darlis Eisenmenger, MD;  Location: [facility] ENDOSCOPY;  Service: Cardiovascular;  Laterality: N/A;   COLONOSCOPY  07/01/2003   HYQ:MVHQIO colonic mucosa except for the proximal right colon in the area of ICV which was not seen completely due to inadequate bowel prep. followed with [facility]BE which was normal.    COLONOSCOPY N/A 08/24/2015   Dr. Riley Cheadle: Normal colon. Next colonoscopy in 10 years.   CORONARY BALLOON ANGIOPLASTY N/A 05/12/2018   Procedure: CORONARY BALLOON ANGIOPLASTY;  Surgeon: Arty Binning, MD;  Location: [facility] INVASIVE CV LAB;  Service: Cardiovascular;  Laterality: N/A;   ELECTROPHYSIOLOGIC STUDY N/A 05/30/2015   Atrial fibrillation ablation by Dr Nunzio Belch   ESOPHAGOGASTRODUODENOSCOPY N/A 08/24/2015   Dr. Riley Cheadle: Medium-sized hiatal hernia, erosive gastropathy. Cameron lesions. Esophageal mucosa distally suggestive of short segment Barrett's esophagus. Not confirmed on biopsy. Gastric biopsy with minimal chronic inflammation   GIVENS CAPSULE STUDY N/A 04/17/2016   Procedure: GIVENS CAPSULE STUDY;  Surgeon: Suzette Espy, MD;  Location: [facility] ENDO SUITE;  Service: Endoscopy;  Laterality: N/A;  Pt to arrive at 8:00 am for 8:30 am appt   LEFT HEART CATH AND CORONARY ANGIOGRAPHY N/A 05/11/2018   Procedure: LEFT HEART CATH AND CORONARY ANGIOGRAPHY;  Surgeon: Arty Binning, MD;  Location[facility] INVASIVE CV LAB;  Service: Cardiovascular;  Laterality: N/A;   TEE WITHOUT CARDIOVERSION N/A 04/12/2015   Procedure: TRANSESOPHAGEAL ECHOCARDIOGRAM (TEE);  Surgeon: Darlis Eisenmenger, MD;  Location: [facility] ENDOSCOPY;  Service: Cardiovascular;  Laterality: N/A;   HPI:  As per H&P written by Dr. Sunnie England on 07/23/2021   Curtis Flores is a 68 y.o. male with medical history significant of lung cancer, prostate cancer, paroxysmal atrial fibrillation, pulmonary fibrosis, hypertension, coronary artery disease, and dyslipidemia who presented with dyspnea.   Recent hospitalization 04/4 to 06/13/23 for community acquired pneumonia, found to have a left malignant pleural effusion consistent with primary adenocarcinoma of the lung with extensive sclerotic osseous lesions.   04/24 Outpatient PET scan with positive metastatic diease to bones, live and nodes.  Recent hospitalization 05/06 to 07/14/23 for heart failure decompensation. Patient underwent diuresis and was discharged home.   Today had follow up with Oncology, he was found volume overloaded with 6 Lbs weight increased and was referred to the ED. At home uses supplemental  02 per [place] 02 2 L/min. He uses a walker for ambulation. He lives with his wife at home.    Assessment / Plan / Recommendation  Clinical Impression  Clinical swallow evaluation completed at bedside, however limited by Pt lethargy. Pt with lower dentition, has upper dentures, not placed. Pt with audible breathing and secretions. Overall poor awareness to task and reduced lip closure with bolus presentations. Pt with only a couple swallows elicited and then unable to particpate due to lethargy. Pt appears high risk for aspiration. Pt likely to go home with hospice per newest note. He is on regular textures and NTL at this time. Consider liberalizing to thin liquids if Pt desires, however coughing reduced with thickened liquids at this time- this should be based on Pt's comfort and requests. No family present at this time. Above to RN/MD. SLP will sign off. Reconsult if needs arise.  SLP Visit Diagnosis: Dysphagia, unspecified (R13.10)    Aspiration Risk  Moderate aspiration risk;Risk for inadequate nutrition/hydration    Diet Recommendation Dysphagia 3 (Mech soft);Nectar-thick liquid;Thin liquid    Liquid Administration via: Cup;Straw Medication Administration: Whole meds with puree Supervision: Staff to assist with self feeding;Full supervision/cueing for compensatory strategies Compensations: Slow rate;Small sips/bites;Clear throat intermittently Postural Changes: Seated upright at 90 degrees;Remain upright for at least 30 minutes after po intake    Other  Recommendations Oral Care Recommendations: Oral care prior to ice chip/H20;Staff/trained caregiver to provide oral care    Recommendations for follow up therapy are one component of a multi-disciplinary discharge planning process, led by the attending physician.  Recommendations may be updated based on patient status, additional functional criteria and insurance authorization.  Follow up Recommendations No SLP follow up      Assistance  Recommended at Discharge    Functional Status Assessment Patient has had a recent decline in their functional status and demonstrates the ability to make significant improvements in function in a reasonable and predictable amount of time.  Frequency and Duration            Prognosis Prognosis for improved oropharyngeal function: Guarded Barriers to Reach Goals: Severity of deficits      Swallow Study   General Date of Onset: 07/24/23 HPI: As per H&P written by Dr. Sunnie England on 07/23/2021   Curtis Flores is a 68 y.o. male with medical history significant of lung cancer, prostate cancer, paroxysmal atrial fibrillation, pulmonary fibrosis, hypertension, coronary artery disease, and dyslipidemia who presented with dyspnea.   Recent hospitalization 04/4 to 06/13/23 for community acquired pneumonia, found to have a left malignant pleural effusion consistent with primary adenocarcinoma of the lung with extensive sclerotic osseous lesions.   04/24 Outpatient PET scan with positive metastatic diease to bones, live and nodes.   Recent hospitalization 05/06 to 07/14/23 for heart failure decompensation. Patient underwent diuresis and was discharged home.   Today had follow up with Oncology, he was found volume overloaded with 6 Lbs weight increased and was referred to the ED. At home uses supplemental 02 per [place] 02 2 L/min. He uses a walker for ambulation. He lives with his wife at home. Type of Study: Bedside Swallow Evaluation Diet Prior to this Study: Regular;Mildly thick liquids (Level 2, nectar thick) Temperature  Spikes Noted: No Respiratory Status: Nasal cannula History of Recent Intubation: No Behavior/Cognition: Lethargic/Drowsy Oral Cavity Assessment: Within Functional Limits Oral Care Completed by SLP: Yes Oral Cavity - Dentition: Dentures, top Vision: Impaired for self-feeding Self-Feeding Abilities: Total assist Patient Positioning: Upright in bed Baseline Vocal Quality: Low vocal  intensity Volitional Cough: Weak Volitional Swallow: Able to elicit    Oral/Motor/Sensory Function Overall Oral Motor/Sensory Function: Generalized oral weakness   Ice Chips Ice chips: Impaired Presentation: Spoon Oral Phase Impairments: Reduced labial seal;Reduced lingual movement/coordination   Thin Liquid Thin Liquid: Impaired Presentation: Spoon Oral Phase Impairments: Reduced labial seal    Nectar Thick Nectar Thick Liquid: Within functional limits Presentation: Spoon;Straw   Honey Thick Honey Thick Liquid: Not tested   Puree Puree: Not tested   Solid     Solid: Not tested     Thank you,  Claudetta Cuba, CCC-SLP [phone]  Corneilus Heggie 07/07/2023,12:22 PM

## 2023-08-03 NOTE — Plan of Care (Signed)
  Problem: Education: Goal: Knowledge of General Education information will improve Description: Including pain rating scale, medication(s)/side effects and non-pharmacologic comfort measures Outcome: Progressing   Problem: Health Behavior/Discharge Planning: Goal: Ability to manage health-related needs will improve Outcome: Progressing   Problem: Clinical Measurements: Goal: Ability to maintain clinical measurements within normal limits will improve Outcome: Progressing   Problem: Coping: Goal: Level of anxiety will decrease Outcome: Progressing   Problem: Elimination: Goal: Will not experience complications related to bowel motility Outcome: Progressing   Problem: Pain Managment: Goal: General experience of comfort will improve and/or be controlled Outcome: Progressing

## 2023-08-03 NOTE — Progress Notes (Signed)
 PT Cancellation Note  Patient Details Name: DEMBA NIGH MRN: [id number] DOB: [birth year]   Cancelled Treatment:    Reason Eval/Treat Not Completed: Pain limiting ability to participate Held therapy session following discussion with RN.  Reports of ribcage pain and difficulty breathing.  RN just gave medication and encouraged therapy to hold to reduce distress.    Minor Amble, LPTA/CLT; CBIS [phone]  Alesia Anchors 07/03/2023, 11:22 AM

## 2023-08-03 NOTE — Consult Note (Signed)
 Consultation Note Date: 07/23/2023   Patient Name: Curtis Flores  DOB: 1955-07-06  MRN: [id number]  Age / Sex: 68 y.o., male  PCP: [name], PA-C Referring Physician: Justina Oman, MD  Reason for Consultation: Establishing goals of care  HPI/Patient Profile: 68 y.o. 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   Clinical Assessment and Goals of Care: I have reviewed medical records including EPIC notes, labs and imaging, received report from RN, assessed the patient.  Curtis Flores is sitting up in bed.  He appears acutely ill and frail.  He is able to tell me his name.  I believe that he can make his basic needs known.  There is no family at bedside at this time.    We meet at the bedside to discuss diagnosis prognosis, GOC, EOL wishes, disposition and options.  I introduced Palliative Medicine as specialized medical care for people living with serious illness. It focuses on providing relief from the symptoms and stress of a serious illness. The goal is to improve quality of life for both the patient and the family.  We focused on their current illness.  We briefly talked about Curtis Flores cancer diagnosis with no treatment options.  We talked about his heart failure and fluid overload.  We talked about diuresing.  The natural disease trajectory and expectations at EOL were discussed.  Advanced directives, concepts specific to code status, artifical feeding and hydration, and rehospitalization were considered and discussed.  DNR verified  Hospice Care services outpatient were explained and offered with transition of care team.  Provider  of choice [facility].  They are to follow-up tomorrow.  Discussed the importance of continued conversation with family and the medical providers regarding overall plan of care and treatment options, ensuring decisions are within the context of the patient's values and GOCs.  Questions and concerns were addressed.  The family was encouraged to call with questions or concerns.  PMT will continue to support holistically.  Conference with transition of care team related to patient condition, needs, goals of care, disposition.   HCPOA  NEXT OF KIN -Curtis Flores names his wife, Curtis Flores, as his healthcare surrogate.    SUMMARY OF RECOMMENDATIONS   Home with the benefits of [facility] hospice care   Code Status/Advance Care Planning: DNR  Symptom Management:  Per attending, no additional needs at this time.  Palliative Prophylaxis:  Frequent Pain Assessment and Oral Care  Additional Recommendations (Limitations, Scope, Preferences): Home with [facility] hospice  Psycho-social/Spiritual:  Desire for further Chaplaincy support:no Additional Recommendations: Caregiving  Support/Resources, Education on Hospice, and Grief/Bereavement Support  Prognosis:  < 4 weeks  Discharge Planning: Home with Hospice      Primary Diagnoses: Present on Admission:  Acute on chronic systolic CHF (congestive heart failure) (HCC)  HCAP (healthcare-associated pneumonia)  Pulmonary fibrosis (HCC)  Rheumatoid arthritis (HCC)  Mixed hyperlipidemia  GERD (gastroesophageal reflux disease)  Essential hypertension  Atrial flutter (HCC)  Adenocarcinoma (HCC)  Hyponatremia   I have reviewed the medical record, interviewed the patient and family, and examined the patient. The following aspects are pertinent.  Past Medical History:  Diagnosis Date   Abnormal CT scan, stomach    Chronic systolic dysfunction of left ventricle    Coronary artery disease    lateral STEMI 02/22/15 3/10 cutting balloon to ISR of the o/pLCX   GERD  (gastroesophageal reflux disease)    Hyperlipidemia    Hypertension    Ischemic cardiomyopathy    LBBB (left bundle branch block)    Leukocytosis    followed by hematology, reactive   Lymphadenopathy    Myocardial infarction (HCC) 02/2015   Psoriasis 2003   Psoriatic arthritis (HCC)    Pulmonary fibrosis (HCC)    Rheumatoid arthritis(714.0) 2012   Typical atrial flutter (HCC)    Social History   Socioeconomic History   Marital status: Married    Spouse name: Curtis Flores   Number of children: 2   Years of education: Not on file   Highest education level: 9th grade  Occupational History   Occupation: unemployed    Comment: not working do to arthritis; used to be Production designer, 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   Drug use: No    Comment: H/O marijuana use many years ago.   Sexual activity: Yes    Birth control/protection: None  Other Topics Concern   Not on file  Social History Narrative   Not on file   Social Drivers of Health   Financial Resource Strain: Low Risk  (04/29/2023)   Received from [facility]   Overall Financial Resource Strain (CARDIA)    Difficulty of Paying Living Expenses: Not very hard  Food Insecurity: No Food Insecurity (07/24/2023)   Hunger Vital Sign    Worried About Running Out of Food in the Last Year: Never true    Ran Out of Food in the Last Year: Never true  Transportation Needs: No Transportation Needs (07/24/2023)   PRAPARE - Scientist, research (physical sciences) (Medical): No    Lack of Transportation (Non-Medical): No  Physical Activity: Unknown (04/29/2023)   Received from [facility]   Exercise Vital Sign    Days of Exercise per Week: 0 days    Minutes of Exercise per Session: Not on file  Stress: No Stress Concern Present (04/29/2023)   Received from [facility] of Occupational [facility] - Occupational Stress Questionnaire    Feeling of Stress : Not at all  Social Connections: Moderately Integrated (07/24/2023)   Social Connection and Isolation Panel [NHANES]    Frequency of Communication with Friends and Family: Once a week    Frequency of Social Gatherings with Friends and Family:  Once a week    Attends Religious Services: 1 to 4 times per year    Active Member of Clubs or Organizations: Yes    Attends Banker Meetings: Never    Marital Status: Married   Family History  Problem Relation Age of Onset   Hypertension Mother    Colon cancer Neg Hx    Scheduled Meds:  budesonide (PULMICORT) nebulizer solution  0.5 mg Nebulization BID   dextromethorphan -guaiFENesin   1 tablet Oral BID   docusate sodium   100 mg Oral Daily   enoxaparin  (LOVENOX ) injection  40 mg Subcutaneous Q24H   feeding supplement  237 mL Oral BID BM   ferrous sulfate   325 mg Oral Q breakfast   furosemide   80 mg Intravenous Q12H   ipratropium-albuterol   3 mL Nebulization TID   methylPREDNISolone  (SOLU-MEDROL ) injection  40 mg Intravenous Daily   metoprolol  succinate  50 mg Oral BID   pantoprazole   40 mg Oral BID AC   scopolamine  1 patch Transdermal Q72H   Continuous Infusions:  piperacillin-tazobactam (ZOSYN)  IV 3.375 g (07/04/2023 0506)   PRN Meds:.acetaminophen  **OR** acetaminophen , food thickener, ipratropium-albuterol , morphine  injection, morphine  CONCENTRATE, ondansetron  **OR** ondansetron  (ZOFRAN ) IV, oxyCODONE  Medications Prior to Admission:  Prior to Admission medications   Medication Sig Start Date End Date  Taking? Authorizing Provider  amLODipine  (NORVASC ) 5 MG tablet Take 1 tablet (5 mg total) by mouth daily. 04/24/23  Yes Avanell Leigh, MD  apixaban  (ELIQUIS ) 5 MG TABS tablet TAKE 1 TABLET TWICE DAILY 05/15/23  Yes Avanell Leigh, MD  atorvastatin  (LIPITOR ) 80 MG tablet TAKE 1 TABLET EVERY DAY AT 6 PM Patient taking differently: Take 80 mg by mouth daily at 6 PM. 07/08/23  Yes Avanell Leigh, MD  azaTHIOprine  (IMURAN ) 50 MG tablet TAKE 3 TABLETS EVERY DAY Patient taking differently: Take 150 mg by mouth every morning. 03/31/23  Yes Romayne Clubs, PA-C  Clobetasol  Prop Emollient Base (CLOBETASOL  PROPIONATE E) 0.05 % emollient cream APPLY TO AFFECTED AREA TWICE A DAY AS NEEDED Patient taking differently: Apply 1 Application topically 2 (two) times daily as needed. Applied to scalp for dermatitis 04/04/23  Yes Romayne Clubs, PA-C  clopidogrel  (PLAVIX ) 75 MG tablet Take 1 tablet (75 mg total) by mouth daily. 04/24/23  Yes Avanell Leigh, MD  docusate sodium  (COLACE) 100 MG capsule Take 1 capsule (100 mg total) by mouth daily. 06/13/23  Yes Pokhrel, Laxman, MD  feeding supplement (ENSURE ENLIVE / ENSURE PLUS) LIQD Take 237 mLs by mouth 2 (two) times daily between meals. 06/13/23  Yes Pokhrel, Amador Bad, MD  ferrous sulfate  325 (65 FE) MG tablet Take 1 tablet (325 mg total) by mouth daily with breakfast. 07/15/23  Yes Shahmehdi, Seyed A, MD  losartan  (COZAAR ) 25 MG tablet Take 1 tablet (25 mg total) by mouth daily. 07/15/23  Yes Shahmehdi, Constantino Demark, MD  metoprolol  succinate (TOPROL -XL) 50 MG 24 hr tablet Take 1 tablet (50 mg total) by mouth 2 (two) times daily. 07/14/23 08/13/23 Yes Shahmehdi, Constantino Demark, MD  pantoprazole  (PROTONIX ) 40 MG tablet TAKE 1 TABLET TWICE DAILY Patient taking differently: Take 40 mg by mouth 2 (two) times daily before a meal. 11/02/20  Yes Delman Ferns, NP  potassium chloride  SA (KLOR-CON  M) 20 MEQ tablet TAKE 2 TABLETS TWICE DAILY Patient taking differently: Take 40 mEq by mouth 2  (two) times daily. 10/31/22  Yes Avanell Leigh, MD  torsemide  40 MG TABS Take 40 mg by mouth daily.  07/15/23 08/14/23 Yes Shahmehdi, Constantino Demark, MD  oxyCODONE  (OXY IR/ROXICODONE ) 5 MG immediate release tablet Take 1 tablet (5 mg total) by mouth every 8 (eight) hours as needed for severe pain (pain score 7-10). 07/24/23   Paulett Boros, MD   No Known Allergies Review of Systems  Unable to perform ROS: Acuity of condition    Physical Exam Vitals and nursing note reviewed.     Vital Signs: BP 130/85 (BP Location: Left Arm)   Pulse 90   Temp 98.4 F (36.9 C) (Oral)   Resp 18   Ht 5\' 7"  (1.702 m)   Wt 73.5 kg   SpO2 98%   BMI 25.38 kg/m  Pain Scale: 0-10 POSS *See Group Information*: 1-Acceptable,Awake and alert Pain Score: 6    SpO2: SpO2: 98 % O2 Device:SpO2: 98 % O2 Flow Rate: .O2 Flow Rate (L/min): 3 L/min  IO: Intake/output summary:  Intake/Output Summary (Last 24 hours) at 07/21/2023 1247 Last data filed at 07/27/2023 2034 Gross per 24 hour  Intake 150 ml  Output 650 ml  Net -500 ml    LBM: Last BM Date : 07/27/23 Baseline Weight: Weight: 74.4 kg Most recent weight: Weight: 73.5 kg     Palliative Assessment/Data:     Time In: 1020 Time Out: 1100 Time Total: 40 minutes  Greater than 50%  of this time was spent counseling and coordinating care related to the above assessment and plan.  Signed by: Annabelle Barrack, NP   Please contact Palliative Medicine Team phone at [phone] for questions and concerns.  For individual provider: See Tilford Foley

## 2023-08-03 NOTE — Progress Notes (Signed)
 Nurse at bedside,patient c/o left side rib cage pain rated a 7, dull,achy,and with a discomfort.Patient given Morphine  1 mg IV for pain per MAR per prn.Patient also c/o shortness of breath,patient had taken off his oxygen, nurse placed oxygen back on patient. Respiratory therapy was also called for support ,regarding breathing treatment. Plan of care on going.Curtis Flores

## 2023-08-03 NOTE — Death Summary Note (Signed)
 DEATH SUMMARY   Patient Details  Name: Curtis Flores MRN: [id number] DOB: 06-29-1955 AOZ:HYQM, Curtis Berger, PA-C Admission/Discharge Information   Admit Date:  2023/07/25  Date of Death:  29-Jul-2023  Time of Death:  1430  Length of Stay: 4   Principle Cause of death: ARDS in the setting of metastatic adenocarcinoma of the lung; pulmonary fibrosis, postobstructive pneumonia and systolic heart failure.  Hospital Diagnoses: Principal Problem:   Acute on chronic systolic CHF (congestive heart failure) (HCC) Active Problems:   HCAP (healthcare-associated pneumonia)   Adenocarcinoma (HCC)   Pulmonary fibrosis (HCC)   Atrial flutter (HCC)   Essential hypertension   Mixed hyperlipidemia   Rheumatoid arthritis (HCC)   GERD (gastroesophageal reflux disease)   Hyponatremia Acute on chronic respiratory failure with hypoxia.  Brief hospital admission narrative course: As per H&P written by Dr. Sunnie England on 07-24-2021  Curtis Flores is a 68 y.o. male with medical history significant of lung cancer, prostate cancer, paroxysmal atrial fibrillation, pulmonary fibrosis, hypertension, coronary artery disease, and dyslipidemia who presented with dyspnea.  Recent hospitalization 04/4 to 06/13/23 for community acquired pneumonia, found to have a left malignant pleural effusion consistent with primary adenocarcinoma of the lung with extensive sclerotic osseous lesions.  04/24 Outpatient PET scan with positive metastatic diease to bones, live and nodes.  Recent hospitalization 05/06 to 07/14/23 for heart failure decompensation. Patient underwent diuresis and was discharged home.  Today had follow up with Oncology, he was found volume overloaded with 6 Lbs weight increased and was referred to the ED.    Patient reports about 7 to 10 days of worsening dyspnea on exertion, associated with lower extremity edema, PND and orthopnea. Poor oral intake and generalized weakness. His dyspnea continue to progress very  rapidly, specially over the last 2 days,  to the point where was symptomatic with minimal efforts. Note able to move independently.     At home uses supplemental 02 per Leon 02 2 L/min. He uses a walker for ambulation. He lives with his wife at home.   Assessment and Plan: Acute on chronic systolic CHF (congestive heart failure) (HCC) - Recent 2D echo echocardiogram with reduced LV systolic function with EF 35 to 40,, RV systolic function preserved, RVSP 52 mmHg, small pericardial effusion, no significant valvular disease.  -Patient overall condition further decline while hospitalized and despite intervention he ended expiring on 07-29-2023 at 1430. - Patient was kept comfortable. - Overall poor prognosis was anticipated and expected. -Family grateful of care and services provided.   HCAP (healthcare-associated pneumonia)/postobstructive pneumonia -Patient received empirical antibiotic for postobstructive pneumonia using Zosyn. -In the setting of MRSA PCR negative vancomycin  was discontinued. - No plans for invasive interventions -Patient peacefully expire on 2023/07/29 at 1430.   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 - Case discussed with patient and patient's daughter currently DNR/DNI - Will continue to stabilize as much as possible, with initial plans to discharge home with hospice and offer comfort and symptomatic management. -Patient condition further decompensated while in the hospital leading to him to pass away.   ARDS/Pulmonary fibrosis (HCC)/chronic respiratory failure -Chronic hypoxemic respiratory failure.  -Imuran  discontinued -Oxygen supplementation, bronchodilator management and the steroids provided.   Atrial flutter (HCC)/atrial fibrillation -Continue telemetry  monitoring - Will resume metoprolol  - Follow clinical response.   Essential hypertension -Overall stable  throughout hospitalization were receiving adjusted home meds and diuresis.   Mixed hyperlipidemia -Overall main goal is for comfort -Statin discontinued to minimize amount of oral pills to be taken while assistant with symptomatic management.   Rheumatoid arthritis (HCC) -Steroids added as part of treatment to assist with bronchodilation and reactive pulmonary fibrosis. - Imuran  discontinued -As needed analgesics provided.   GERD (gastroesophageal reflux disease) -Kept on PPI.   Hyponatremia -Most likely associated with hypervolemia and underlying lung disease. -Sodium level improved with diuresis.   Stage II pressure injury in his buttocks bilaterally - No signs of superimposed infection - Pressure injuries present at time of admission.   Air hunger/increased secretion/End of life care - Low-dose Roxanol, as needed lorazepam and a scopolamine patch has been ordered - Patient peacefully expired at 1430 on 07/27/2023   Dysphagia/difficulty with thin liquids - Patient remains at high risk for aspiration - Plan was for symptomatic management and comfort only. - Comfort feeding at work allowed - Patient peacefully expired on 07/18/2023.    Procedures: See below for x-ray reports.  Consultations: IR, palliative care; pulmonology service curbside.  The results of significant diagnostics from this hospitalization (including imaging, microbiology, ancillary and laboratory) are listed below for reference.   Significant Diagnostic Studies: US  CHEST (PLEURAL EFFUSION) Result Date: 07/25/2023 CLINICAL DATA:  Patient with history of metastatic lung cancer, prostate cancer, AFib, pulmonary fibrosis admitted with worsening dyspnea in found to have bilateral pleural effusions as well as left main stem bronchus occlusion. Request for possible thoracentesis EXAM: BILATERAL CHEST  ULTRASOUND COMPARISON:  CT chest w/contrast and CXR 07/24/23 FINDINGS: Bilateral chest ultrasound performed. Right chest shows very small simple appearing pleural fluid. Left chest shows small loculated pleural effusion inferiorly and laterally as well as atelectasis. No safe window for thoracentesis. IMPRESSION: Bilateral US  chest significant for very small right and left pleural effusions which are not amenable to thoracentesis. No procedure performed. Electronically Signed   By: Creasie Doctor M.D.   On: 07/25/2023 16:16   CT Chest W Contrast Result Date: 07/24/2023 CLINICAL DATA:  Dyspnea abnormal chest x-ray EXAM: CT CHEST WITH CONTRAST TECHNIQUE: Multidetector CT imaging of the chest was performed during intravenous contrast administration. RADIATION DOSE REDUCTION: This exam was performed according to the departmental dose-optimization program which includes automated exposure control, adjustment of the mA and/or kV according to patient size and/or use of iterative reconstruction technique. CONTRAST:  75mL OMNIPAQUE  IOHEXOL  300 MG/ML  SOLN COMPARISON:  07/24/2023, PET CT 06/26/2023, CT chest 06/06/2023, 06/06/2023, 05/26/2023 FINDINGS: Cardiovascular: Moderate aortic atherosclerosis. No aneurysm. 3 vessel coronary vascular calcification. Normal cardiac size. Small quantity pericardial effusion Mediastinum/Nodes: Patent trachea. No thyroid mass. Multiple mildly enlarged mediastinal lymph nodes. Prevascular nodes measuring up to 8 mm. AP window nodes measuring up to 16 mm. Enlarged right supraclavicular nodes measuring up to 11 mm. Subcarinal nodes measuring up to 16 mm. Small hilar nodes. Esophagus is unremarkable. Lungs/Pleura: Scattered areas of pulmonary fibrosis. Large left-sided loculated pleural effusion, increased compared to prior. Moderate right-sided pleural effusion also increased compared to prior. Diffuse consolidation of the left lung parenchyma. Internal areas of small gas collection and fluid,  for example series 2, image 61 measuring about 37 x 20 mm. Similar appearing area posteriorly on series 2, image 61 through 64, measuring about 25 x 21 mm. Heterogenous areas of hypoenhancement throughout the consolidated left lung. Occluded appearing left bronchus with appearance suggesting possible thickening of the wall, coronal series 5, image 72. In the right lung, new  heterogeneous foci of airspace disease in the right upper and lower lobes. Upper Abdomen: Left adrenal mass measuring 19 mm, corresponding to history of metastatic focus on PET CT. Musculoskeletal: Extensive lytic and sclerotic metastatic disease involving the ribs, sternum, spine, and shoulders. New pathologic fracture deformity at T12 IMPRESSION: 1. Large loculated left pleural effusion, increased compared to prior. Moderate right-sided pleural effusion also increased compared to prior. Occluded appearing left bronchus with appearance suggesting possible soft tissue thickening of the wall, correlation with bronchoscopy if not already performed. There is diffuse heterogeneous consolidation of the lung parenchyma with focal areas of gas and fluid collection within suggesting necrosis and or possibly developing abscess. 2. New heterogeneous foci of airspace disease in the right upper and lower lobes, possible pneumonia, superimposed on underlying fibrotic lung disease 3. Extensive lytic and sclerotic metastatic disease involving the ribs, sternum, spine, and shoulders. New pathologic fracture deformity at T12. 4. Left adrenal mass measuring 19 mm, corresponding to history of metastatic focus on PET CT. 5. Aortic atherosclerosis. Aortic Atherosclerosis (ICD10-I70.0). Electronically Signed   By: Esmeralda Hedge M.D.   On: 07/24/2023 18:48   DG Chest Port 1 View Result Date: 07/24/2023 CLINICAL DATA:  [id number] Sepsis [facility]) [id number] EXAM: PORTABLE CHEST 1 VIEW COMPARISON:  07/08/2023. FINDINGS: Since the prior study, there is further progression with  now complete opacification of left hemithorax. There are increased interstitial markings in the right lung, grossly similar to the prior study. There is minimal right pleural effusion blunting the right lateral costophrenic angle and probable atelectatic changes at the right lung base. Evaluation of cardiomediastinal silhouette is nondiagnostic due to left hemithorax opacification. No acute osseous abnormalities. The soft tissues are within normal limits. IMPRESSION: 1. Further progression with now complete opacification of left hemithorax. Findings may represent combination of pleural effusion and lung collapse with or without consolidation. 2. Increased interstitial markings in the right lung, grossly similar to the prior study. 3. Minimal right pleural effusion. Electronically Signed   By: Beula Brunswick M.D.   On: 07/24/2023 15:57   ECHOCARDIOGRAM LIMITED Result Date: 07/12/2023    ECHOCARDIOGRAM LIMITED REPORT   Patient Name:   Curtis Flores [facility] Date of Exam: 07/12/2023 Medical Rec #:  [id number]      Height:       66.0 in Accession #:    [id number]     Weight:       158.1 lb Date of Birth:  August 24, 1955       BSA:          1.810 m Patient Age:    68 years       BP:           156/76 mmHg Patient Gender: M              HR:           82 bpm. Exam Location:  [facility] Procedure: Limited Echo, Limited Color Doppler and Cardiac Doppler (Both            Spectral and Color Flow Doppler were utilized during procedure). Indications:    CHF  History:        Patient has prior history of Echocardiogram examinations, most                 recent 01/14/2023. CHF, CAD, Signs/Symptoms:Chest Pain; Risk                 Factors:Hypertension and Dyslipidemia.  Sonographer:    Juanita Shaw Referring  Phys: [id number] Sondra Duran DUNLAP IMPRESSIONS  1. Left ventricular ejection fraction, by estimation, is 35 to 40%. The left ventricle has moderately decreased function.  2. Right ventricular systolic function is normal. The right ventricular  size is normal. There is moderately elevated pulmonary artery systolic pressure.  3. A small pericardial effusion is present. The pericardial effusion is surrounding the apex. Large pleural effusion in both left and right lateral regions.  4. No evidence of mitral valve regurgitation.  5. The aortic valve was not well visualized. Aortic valve regurgitation is not visualized.  6. The inferior vena cava is normal in size with greater than 50% respiratory variability, suggesting right atrial pressure of 3 mmHg. Comparison(s): Prior images reviewed side by side. Large-bilateral pleural effusions are new. FINDINGS  Left Ventricle: Left ventricular ejection fraction, by estimation, is 35 to 40%. The left ventricle has moderately decreased function. The left ventricular internal cavity size was normal in size. Right Ventricle: The right ventricular size is normal. No increase in right ventricular wall thickness. Right ventricular systolic function is normal. There is moderately elevated pulmonary artery systolic pressure. The tricuspid regurgitant velocity is 3.50 m/s, and with an assumed right atrial pressure of 3 mmHg, the estimated right ventricular systolic pressure is 52.0 mmHg. Pericardium: A small pericardial effusion is present. The pericardial effusion is surrounding the apex. Tricuspid Valve: The tricuspid valve is normal in structure. Tricuspid valve regurgitation is not demonstrated. No evidence of tricuspid stenosis. Aortic Valve: The aortic valve was not well visualized. Aortic valve regurgitation is not visualized. Venous: The inferior vena cava is normal in size with greater than 50% respiratory variability, suggesting right atrial pressure of 3 mmHg. Additional Comments: There is a large pleural effusion in both left and right lateral regions.  LEFT VENTRICLE PLAX 2D LVIDd:         5.20 cm LVIDs:         4.10 cm LV PW:         1.10 cm LV IVS:        0.90 cm  LV Volumes (MOD) LV vol d, MOD A2C: 133.0 ml LV  vol d, MOD A4C: 120.0 ml LV vol s, MOD A2C: 73.8 ml LV vol s, MOD A4C: 77.3 ml LV SV MOD A2C:     59.2 ml LV SV MOD A4C:     120.0 ml LV SV MOD BP:      52.9 ml RIGHT VENTRICLE            IVC RV S prime:     9.46 cm/s  IVC diam: 1.30 cm TAPSE (M-mode): 0.7 cm TRICUSPID VALVE TR Peak grad:   49.0 mmHg TR Vmax:        350.00 cm/s Gloriann Larger MD Electronically signed by Gloriann Larger MD Signature Date/Time: 07/12/2023/1:35:39 PM    Final    DG Chest 2 View Result Date: 07/08/2023 CLINICAL DATA:  Shortness of breath EXAM: CHEST - 2 VIEW COMPARISON:  June 08, 2023, 4:59 a.m. FINDINGS: Persistent bilateral pulmonary infiltrates with basilar atelectasis and bilateral pleural effusions findings that could correlate with pneumonia and superimposed congestive changes. Heart and mediastinum upper limits of normal with a left ventricular configuration. IMPRESSION: Persistent pulmonary infiltrates and atelectasis Electronically Signed   By: Fredrich Jefferson M.D.   On: 07/08/2023 15:37    Microbiology: Recent Results (from the past 240 hours)  Culture, blood (Routine x 2)     Status: None (Preliminary result)   Collection Time: 07/24/23  3:37  PM   Specimen: BLOOD  Result Value Ref Range Status   Specimen Description BLOOD RIGHT ARM  Final   Special Requests   Final    BOTTLES DRAWN AEROBIC AND ANAEROBIC Blood Culture results may not be optimal due to an inadequate volume of blood received in culture bottles   Culture   Final    NO GROWTH 4 DAYS Performed at [facility], [address] [place], [place] [address]    Report Status PENDING  Incomplete  Culture, blood (Routine x 2)     Status: None (Preliminary result)   Collection Time: 07/24/23  4:11 PM   Specimen: BLOOD  Result Value Ref Range Status   Specimen Description BLOOD RIGHT ANTECUBITAL  Final   Special Requests   Final    BOTTLES DRAWN AEROBIC ONLY Blood Culture results may not be optimal due to an inadequate volume of blood received  in culture bottles   Culture   Final    NO GROWTH 4 DAYS Performed at [facility], [address] [place], [place] [address]    Report Status PENDING  Incomplete  MRSA Next Gen by PCR, Nasal     Status: None   Collection Time: 07/24/23  9:30 PM   Specimen: Nasal Mucosa; Nasal Swab  Result Value Ref Range Status   MRSA by PCR Next Gen NOT DETECTED NOT DETECTED Final    Comment: (NOTE) The GeneXpert MRSA Assay (FDA approved for NASAL specimens only), is one component of a comprehensive MRSA colonization surveillance program. It is not intended to diagnose MRSA infection nor to guide or monitor treatment for MRSA infections. Test performance is not FDA approved in patients less than 76 years old. Performed at [facility], [address] [place], [place] [address]     Time spent: 40 minutes  Signed: Justina Oman, MD 07/23/2023

## 2023-08-03 NOTE — Progress Notes (Addendum)
 Patient alert,and oriented to person,and place,confused to time and situation.No c/o pain noted this am.Patient is having difficulty with breathing and swallowing this am.Patient sitting straight up in bed to help him with his breathing Oxygen saturation was 98 on 3 liters of oxygen, via nasal canula.Plan of care on going.

## 2023-08-03 DEATH — deceased

## 2023-08-08 ENCOUNTER — Encounter (HOSPITAL_COMMUNITY): Admitting: Cardiology

## 2023-08-15 ENCOUNTER — Ambulatory Visit (HOSPITAL_BASED_OUTPATIENT_CLINIC_OR_DEPARTMENT_OTHER): Admitting: Primary Care

## 2023-09-26 ENCOUNTER — Ambulatory Visit: Admitting: Primary Care

## 2023-11-28 ENCOUNTER — Ambulatory Visit: Admitting: Rheumatology

## 2024-03-23 ENCOUNTER — Other Ambulatory Visit: Payer: Medicare HMO

## 2024-03-23 ENCOUNTER — Ambulatory Visit: Payer: Medicare HMO | Admitting: Physician Assistant
